# Patient Record
Sex: Female | Born: 1995 | Race: Black or African American | Hispanic: No | Marital: Single | Smoking: Never smoker
Health system: Southern US, Community
[De-identification: ages and names within clinical notes are randomized; demographics above are authoritative.]

## PROBLEM LIST (undated history)

## (undated) DIAGNOSIS — R609 Edema, unspecified: Secondary | ICD-10-CM

## (undated) DIAGNOSIS — K200 Eosinophilic esophagitis: Secondary | ICD-10-CM

## (undated) DIAGNOSIS — I10 Essential (primary) hypertension: Secondary | ICD-10-CM

## (undated) DIAGNOSIS — F909 Attention-deficit hyperactivity disorder, unspecified type: Secondary | ICD-10-CM

## (undated) DIAGNOSIS — K859 Acute pancreatitis without necrosis or infection, unspecified: Secondary | ICD-10-CM

## (undated) DIAGNOSIS — E1121 Type 2 diabetes mellitus with diabetic nephropathy: Secondary | ICD-10-CM

## (undated) DIAGNOSIS — N049 Nephrotic syndrome with unspecified morphologic changes: Secondary | ICD-10-CM

## (undated) DIAGNOSIS — F329 Major depressive disorder, single episode, unspecified: Secondary | ICD-10-CM

## (undated) DIAGNOSIS — F32A Depression, unspecified: Secondary | ICD-10-CM

## (undated) DIAGNOSIS — T7840XA Allergy, unspecified, initial encounter: Secondary | ICD-10-CM

## (undated) DIAGNOSIS — F319 Bipolar disorder, unspecified: Secondary | ICD-10-CM

## (undated) DIAGNOSIS — F4310 Post-traumatic stress disorder, unspecified: Secondary | ICD-10-CM

## (undated) DIAGNOSIS — F410 Panic disorder [episodic paroxysmal anxiety] without agoraphobia: Secondary | ICD-10-CM

## (undated) DIAGNOSIS — J45909 Unspecified asthma, uncomplicated: Secondary | ICD-10-CM

## (undated) DIAGNOSIS — E1140 Type 2 diabetes mellitus with diabetic neuropathy, unspecified: Secondary | ICD-10-CM

## (undated) DIAGNOSIS — K5010 Crohn's disease of large intestine without complications: Secondary | ICD-10-CM

## (undated) DIAGNOSIS — K219 Gastro-esophageal reflux disease without esophagitis: Secondary | ICD-10-CM

## (undated) HISTORY — DX: Type 2 diabetes mellitus with diabetic neuropathy, unspecified: E11.40

## (undated) HISTORY — DX: Nephrotic syndrome with unspecified morphologic changes: N04.9

## (undated) HISTORY — DX: Acute pancreatitis without necrosis or infection, unspecified: K85.90

## (undated) HISTORY — DX: Bipolar disorder, unspecified: F31.9

## (undated) HISTORY — PX: ADENOIDECTOMY: SUR15

## (undated) HISTORY — DX: Panic disorder (episodic paroxysmal anxiety): F41.0

## (undated) HISTORY — DX: Eosinophilic esophagitis: K20.0

## (undated) HISTORY — DX: Post-traumatic stress disorder, unspecified: F43.10

## (undated) HISTORY — DX: Attention-deficit hyperactivity disorder, unspecified type: F90.9

## (undated) HISTORY — DX: Type 2 diabetes mellitus with diabetic nephropathy: E11.21

## (undated) HISTORY — DX: Allergy, unspecified, initial encounter: T78.40XA

## (undated) HISTORY — DX: Unspecified asthma, uncomplicated: J45.909

## (undated) HISTORY — PX: HERNIA REPAIR: SHX51

## (undated) HISTORY — DX: Essential (primary) hypertension: I10

## (undated) HISTORY — DX: Gastro-esophageal reflux disease without esophagitis: K21.9

## (undated) HISTORY — PX: TONSILLECTOMY: SUR1361

## (undated) HISTORY — DX: Depression, unspecified: F32.A

## (undated) HISTORY — DX: Major depressive disorder, single episode, unspecified: F32.9

---
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
## 2015-02-09 ENCOUNTER — Other Ambulatory Visit: Payer: Self-pay | Admitting: Family Medicine

## 2015-02-11 NOTE — Telephone Encounter (Signed)
Attempted to call patient to enquire about this refill request. It appears that she last received a refill on 07/11/13 for 3 mL of lantus with 11 refills. Per that refill she received 110 days worth of insulin. It would appear that that prescription would only last her until the beginning of December 2014. It also appears that Dr Awanda Mink has been removed as the patients PCP in epic. I was going to enquire about this refill and how she has received her insulin over the past 1.5 years. Also was going to advise the patient that she needs a f/u appointment if she is going to continue to see Dr Awanda Mink. The phone was busy and I was unable to leave a message. I will ask nursing to give the patient a call to enquire about this.

## 2015-10-20 ENCOUNTER — Encounter (INDEPENDENT_AMBULATORY_CARE_PROVIDER_SITE_OTHER): Payer: Self-pay | Admitting: *Deleted

## 2015-11-22 ENCOUNTER — Encounter (INDEPENDENT_AMBULATORY_CARE_PROVIDER_SITE_OTHER): Payer: Self-pay | Admitting: *Deleted

## 2015-11-22 ENCOUNTER — Ambulatory Visit (INDEPENDENT_AMBULATORY_CARE_PROVIDER_SITE_OTHER): Payer: Medicaid Other | Admitting: Internal Medicine

## 2015-11-22 ENCOUNTER — Telehealth (INDEPENDENT_AMBULATORY_CARE_PROVIDER_SITE_OTHER): Payer: Self-pay | Admitting: Internal Medicine

## 2015-11-24 NOTE — Telephone Encounter (Signed)
error 

## 2015-11-30 ENCOUNTER — Ambulatory Visit: Payer: Self-pay | Admitting: "Endocrinology

## 2016-01-30 ENCOUNTER — Emergency Department (HOSPITAL_COMMUNITY)
Admission: EM | Admit: 2016-01-30 | Discharge: 2016-01-30 | Disposition: A | Discharge status: Home / Self Care | Payer: Medicaid Other | Attending: Emergency Medicine | Admitting: Emergency Medicine

## 2016-01-30 ENCOUNTER — Encounter (HOSPITAL_COMMUNITY): Payer: Self-pay | Admitting: *Deleted

## 2016-01-30 DIAGNOSIS — Y9389 Activity, other specified: Secondary | ICD-10-CM | POA: Insufficient documentation

## 2016-01-30 DIAGNOSIS — Y92410 Unspecified street and highway as the place of occurrence of the external cause: Secondary | ICD-10-CM | POA: Insufficient documentation

## 2016-01-30 DIAGNOSIS — S8990XA Unspecified injury of unspecified lower leg, initial encounter: Secondary | ICD-10-CM | POA: Insufficient documentation

## 2016-01-30 DIAGNOSIS — Y998 Other external cause status: Secondary | ICD-10-CM | POA: Diagnosis not present
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
## 2016-01-30 NOTE — ED Notes (Signed)
Pt left after triage- before being seen

## 2016-01-30 NOTE — ED Notes (Signed)
No report on this pt from triage she has not been visualized- not in room 1404

## 2016-06-27 ENCOUNTER — Ambulatory Visit: Payer: Self-pay | Admitting: "Endocrinology

## 2017-03-20 LAB — HEMOGLOBIN A1C: Hemoglobin A1C: 13.2

## 2017-03-27 ENCOUNTER — Ambulatory Visit (INDEPENDENT_AMBULATORY_CARE_PROVIDER_SITE_OTHER): Payer: Medicaid Other | Admitting: "Endocrinology

## 2017-03-27 ENCOUNTER — Encounter: Payer: Self-pay | Admitting: "Endocrinology

## 2017-03-27 VITALS — BP 106/75 | HR 106 | Ht <= 58 in | Wt 119.0 lb

## 2017-03-27 DIAGNOSIS — E108 Type 1 diabetes mellitus with unspecified complications: Secondary | ICD-10-CM

## 2017-03-27 DIAGNOSIS — E1065 Type 1 diabetes mellitus with hyperglycemia: Secondary | ICD-10-CM

## 2017-03-27 DIAGNOSIS — IMO0002 Reserved for concepts with insufficient information to code with codable children: Secondary | ICD-10-CM

## 2017-03-27 MED ORDER — GLUCOSE BLOOD VI STRP: ORAL_STRIP | 2 refills | Status: DC

## 2017-03-27 MED ORDER — INSULIN ASPART 100 UNIT/ML FLEXPEN: 10.0000 [IU] | PEN_INJECTOR | Freq: Three times a day (TID) | SUBCUTANEOUS | 2 refills | Status: DC

## 2017-03-27 MED ORDER — INSULIN GLARGINE 100 UNIT/ML SOLOSTAR PEN: 20.0000 [IU] | PEN_INJECTOR | Freq: Every day | SUBCUTANEOUS | 2 refills | Status: DC

## 2017-03-27 MED ORDER — GLUCAGON (RDNA) 1 MG IJ KIT: 1.0000 mg | PACK | Freq: Once | INTRAMUSCULAR | 99 refills | Status: DC | PRN

## 2017-03-27 NOTE — Progress Notes (Signed)
Subjective:    Patient ID: Diamond [name], female    DOB: Jan 06, 1996. Patient is being seen in consultation for management of diabetes requested by  Celedonio Savage, MD  Past Medical History:  Diagnosis Date  . ADHD (attention deficit hyperactivity disorder) 2007  . Allergy   . Asthma   . Crohn's colitis ([place])    Dx Dr. Cherrie Gauze  . Depression    early childhood, trial of seroquel?  . Diabetes mellitus 2008  . GERD (gastroesophageal reflux disease)    Past Surgical History:  Procedure Laterality Date  . ADENOIDECTOMY    . HERNIA REPAIR    . TONSILLECTOMY     Social History   Social History  . Marital status: Single    Spouse name: N/A  . Number of children: N/A  . Years of education: N/A   Social History Main Topics  . Smoking status: Never Smoker  . Smokeless tobacco: Never Used  . Alcohol use No  . Drug use: No  . Sexual activity: Not Currently    Birth control/ protection: Condom   Other Topics Concern  . None   Social History Narrative   Lives with mother Mont Dutton.    High school at [employer].               Outpatient Encounter Prescriptions as of 03/27/2017  Medication Sig  . albuterol (PROVENTIL HFA;VENTOLIN HFA) 108 (90 BASE) MCG/ACT inhaler Inhale 2 puffs into the lungs every 6 (six) hours as needed. For shortness of breath  . B-D ULTRAFINE III SHORT PEN 31G X 8 MM MISC USE 4 TIMES A DAY  . Fluticasone-Salmeterol (ADVAIR) 100-50 MCG/DOSE AEPB Inhale 1 puff into the lungs every 12 (twelve) hours.   Marland Kitchen glucagon (GLUCAGON EMERGENCY) 1 MG injection Inject 1 mg into the vein once as needed.  Marland Kitchen glucose blood (ACCU-CHEK GUIDE) test strip Use as instructed  . insulin aspart (NOVOLOG FLEXPEN) 100 UNIT/ML FlexPen Inject 10-16 Units into the skin 3 (three) times daily with meals.  . Insulin Glargine (LANTUS SOLOSTAR) 100 UNIT/ML Solostar Pen Inject 20 Units into the skin daily at 10 pm.  . [DISCONTINUED] cetirizine (ZYRTEC) 10 MG tablet Take 10 mg by  mouth daily.  . [DISCONTINUED] glucagon (GLUCAGON EMERGENCY) 1 MG injection Inject 1 mg into the vein once as needed.  . [DISCONTINUED] glucose blood (ACCU-CHEK GUIDE) test strip Use as instructed  . [DISCONTINUED] hydrocortisone 2.5 % ointment Apply topically 2 (two) times daily.  . [DISCONTINUED] ibuprofen (ADVIL,MOTRIN) 200 MG tablet Take 200 mg by mouth every 6 (six) hours as needed. For pain  . [DISCONTINUED] insulin aspart (NOVOLOG FLEXPEN) 100 UNIT/ML FlexPen Inject 10-16 Units into the skin 3 (three) times daily with meals.  . [DISCONTINUED] insulin aspart (NOVOLOG) 100 UNIT/ML injection Inject 5 Units into the skin 3 (three) times daily before meals. Give 1 unit per 15 g carbs  . [DISCONTINUED] Insulin Glargine (LANTUS SOLOSTAR) 100 UNIT/ML Solostar Pen Inject 20 Units into the skin daily at 10 pm.  . [DISCONTINUED] insulin glargine (LANTUS) 100 UNIT/ML injection Inject 20 Units into the skin at bedtime.  . [DISCONTINUED] LANTUS SOLOSTAR 100 UNIT/ML SOPN inject 30 units subcutaneously at bedtime (Patient taking differently: inject 20 units subcutaneously at bedtime)  . [DISCONTINUED] NOVOLOG FLEXPEN 100 UNIT/ML SOPN FlexPen INJECT 0-20 UNITS INTO THE SKIN 3 TIMES DAILY BEFORE MEALS AS INSTRUCTED (Patient taking differently: INJECT 10 UNITS INTO THE SKIN 3 TIMES DAILY BEFORE MEALS AS INSTRUCTED)  . [DISCONTINUED]  pantoprazole (PROTONIX) 20 MG tablet take 1 tablet by mouth once daily   No facility-administered encounter medications on file as of 03/27/2017.    ALLERGIES: Allergies  Allergen Reactions  . Amoxicillin Hives  . Augmentin [Amoxicillin-Pot Clavulanate] Other (See Comments)    Reaction unknown  . Adhesive [Tape] Itching and Rash  . Latex Itching and Rash  . Pineapple Rash    Rash on tongue and throat   VACCINATION STATUS: Immunization History  Administered Date(s) Administered  . HPV Quadrivalent 08/27/2012  . Hepatitis A 08/27/2012  . Influenza Split 08/27/2012  .  Meningococcal Conjugate 08/27/2012    Diabetes  She presents for her initial diabetic visit. She has type 1 diabetes mellitus. Onset time: She was diagnosed at approximate age of 46 years. Her disease course has been worsening. There are no hypoglycemic associated symptoms. Pertinent negatives for hypoglycemia include no confusion, headaches, pallor or seizures. Associated symptoms include blurred vision, fatigue, polydipsia and polyuria. Pertinent negatives for diabetes include no chest pain and no polyphagia. There are no hypoglycemic complications. Symptoms are worsening. There are no diabetic complications. Risk factors for coronary artery disease include diabetes mellitus and sedentary lifestyle. Current diabetic treatment includes insulin injections. Her weight is decreasing steadily. She is following a generally unhealthy diet. When asked about meal planning, she reported none. She has not had a previous visit with a dietitian. She never participates in exercise. (She did not bring any meter nor logs to review today. She does not monitor blood glucose regularly.) An ACE inhibitor/angiotensin II receptor blocker is not being taken.       Review of Systems  Constitutional: Positive for fatigue. Negative for chills, fever and unexpected weight change.  HENT: Negative for trouble swallowing and voice change.   Eyes: Positive for blurred vision. Negative for visual disturbance.  Respiratory: Negative for cough, shortness of breath and wheezing.   Cardiovascular: Negative for chest pain, palpitations and leg swelling.  Gastrointestinal: Negative for diarrhea, nausea and vomiting.  Endocrine: Positive for polydipsia and polyuria. Negative for cold intolerance, heat intolerance and polyphagia.  Musculoskeletal: Negative for arthralgias and myalgias.  Skin: Negative for color change, pallor, rash and wound.  Neurological: Negative for seizures and headaches.  Psychiatric/Behavioral: Negative for  confusion and suicidal ideas.    Objective:    BP 106/75   Pulse (!) 106   Ht 4' 10"  (1.473 m)   Wt 119 lb (54 kg)   BMI 24.87 kg/m   Wt Readings from Last 3 Encounters:  03/27/17 119 lb (54 kg)  01/30/16 127 lb (57.6 kg) (48 %, Z= -0.05)*  10/23/12 125 lb 3.2 oz (56.8 kg) (59 %, Z= 0.24)*   * Growth percentiles are based on CDC 2-20 Years data.    Physical Exam  Constitutional: She is oriented to person, place, and time. She appears well-developed.  HENT:  Head: Normocephalic and atraumatic.  Eyes: EOM are normal.  Neck: Normal range of motion. Neck supple. No tracheal deviation present. No thyromegaly present.  Cardiovascular: Normal rate and regular rhythm.   Pulmonary/Chest: Effort normal and breath sounds normal.  Abdominal: Soft. Bowel sounds are normal. There is no tenderness. There is no guarding.  Musculoskeletal: Normal range of motion. She exhibits no edema.  Neurological: She is alert and oriented to person, place, and time. She has normal reflexes. No cranial nerve deficit. Coordination normal.  Skin: Skin is warm and dry. No rash noted. No erythema. No pallor.  Psychiatric:  Patient is very reluctant. She  appears to minimize the impact and burden of diabetes.    CMP     Component Value Date/Time   NA 135 09/13/2012 2040   K 4.0 09/13/2012 2040   CL 101 09/13/2012 2040   CO2 23 09/13/2012 1958   GLUCOSE 406 (H) 09/13/2012 2040   BUN 11 09/13/2012 2040   CREATININE 0.80 09/13/2012 2040   CREATININE 0.69 07/08/2012 1203   CALCIUM 9.5 09/13/2012 1958   PROT 7.4 08/22/2012 2218   ALBUMIN 3.3 (L) 08/22/2012 2218   AST 33 08/22/2012 2218   ALT 9 08/22/2012 2218   ALKPHOS 62 08/22/2012 2218   BILITOT 0.4 08/22/2012 2218   GFRNONAA NOT CALCULATED 09/13/2012 1958   GFRAA NOT CALCULATED 09/13/2012 1958     Diabetic Labs (most recent): Lab Results  Component Value Date   HGBA1C 13.2 03/20/2017   HGBA1C 10.1 10/23/2012   HGBA1C 11.2 07/08/2012      Assessment & Plan:   1. Uncontrolled type 1 diabetes mellitus with complication ([place])  - Patient has currently uncontrolled symptomatic type 1 DM since  21 years of age,  with most recent A1c of 13.2 %.  - Patient has had several prior DKA episodes and several ER visits for extremes of glycemic profiles.  She currently resides with her aunt in [place].    Her diabetes is complicated by inadequate engagement in self-care and patient remains at a high risk for more acute and chronic complications of diabetes which include CAD, CVA, CKD, retinopathy, and neuropathy. These are all discussed in detail with the patient.  - I have counseled the patient on diet management and weight loss, by adopting a carbohydrate restricted/protein rich diet.  - Suggestion is made for patient to avoid simple carbohydrates   from their diet including Cakes , Desserts, Ice Cream,  Soda (  diet and regular) , Sweet Tea , Candies,  Chips, Cookies, Artificial Sweeteners,   and "Sugar-free" Products . This will help patient to have stable blood glucose profile and potentially avoid unintended weight gain.  - I encouraged the patient to switch to  unprocessed or minimally processed complex starch and increased protein intake (animal or plant source), fruits, and vegetables.  - Patient is advised to stick to a routine mealtimes to eat 3 meals  a day and avoid unnecessary snacks ( to snack only to correct hypoglycemia).  - The patient will be scheduled with Jearld Fenton, RDN, CDE for individualized DM education.  - I have approached patient with the following individualized plan to manage diabetes and patient agrees:   - Based on her current A1c of 13.2%, she may need higher dose of insulin. -However, it is essential to assure her commitment for proper monitoring/safe use of insulin. -  I  will proceed with her basal insulin of Lantus 20 units daily at bedtime, and prandial insulin NovoLog 10 units 3  times a day before meals for pre-meal BG readings of 90-112m/dl, plus patient specific correction dose for unexpected hyperglycemia above 1574mdl, associated with strict monitoring of glucose  AC and HS. - Patient is warned not to take insulin without proper monitoring per orders. -Adjustment parameters are given for hypo and hyperglycemia in writing. -Patient is encouraged to call clinic for blood glucose levels less than 70 or above 300 mg /dl. - Insulin is exclusive choices she has to treat diabetes. - Patient specific target  A1c;  LDL, HDL, Triglycerides, and  Waist Circumference were discussed in detail.  2) BP:  Controlled.  3) Lipids/HPL:   Control unknown.    4)  Weight/Diet: Her current weight is appropriate for her height, CDE Consult will be initiated , exercise, and detailed carbohydrates information provided.  5) Chronic Care/Health Maintenance: - She is  encouraged to continue to follow up with Ophthalmology, Podiatrist at least yearly or according to recommendations, and advised to   stay away from smoking. I have recommended yearly flu vaccine and pneumonia vaccination at least every 5 years; moderate intensity exercise for up to 150 minutes weekly; and  sleep for at least 7 hours a day.  - 60 minutes of time was spent on the care of this patient , 50% of which was applied for counseling on diabetes complications and their preventions.  - Patient to bring meter and  blood glucose logs during her next visit.   - I advised patient to maintain close follow up with Celedonio Savage, MD for primary care needs.  Follow up plan: - Return in about 1 week (around 04/03/2017) for follow up with meter and logs- no labs.  Glade Lloyd, MD Phone: [phone]  Fax: [phone]   03/27/2017, 3:49 PM

## 2017-03-27 NOTE — Patient Instructions (Signed)

## 2017-04-01 ENCOUNTER — Other Ambulatory Visit: Payer: Self-pay

## 2017-04-01 ENCOUNTER — Telehealth: Payer: Self-pay | Admitting: "Endocrinology

## 2017-04-01 MED ORDER — GLUCOSE BLOOD VI STRP: ORAL_STRIP | 5 refills | Status: DC

## 2017-04-01 NOTE — Telephone Encounter (Signed)
RX sent

## 2017-04-01 NOTE — Telephone Encounter (Signed)
Diamond [name] is asking for a refill of Test Strips to be sent in for her Accu Check Guide to [facility] in [place], please Advise?

## 2017-04-03 ENCOUNTER — Other Ambulatory Visit: Payer: Self-pay

## 2017-04-03 ENCOUNTER — Ambulatory Visit: Payer: Medicaid Other | Admitting: "Endocrinology

## 2017-04-03 MED ORDER — GLUCOSE BLOOD VI STRP: ORAL_STRIP | 5 refills | Status: DC

## 2017-04-03 MED ORDER — INSULIN ASPART 100 UNIT/ML FLEXPEN: 10.0000 [IU] | PEN_INJECTOR | Freq: Three times a day (TID) | SUBCUTANEOUS | 2 refills | Status: DC

## 2017-04-03 MED ORDER — INSULIN GLARGINE 100 UNIT/ML SOLOSTAR PEN: 20.0000 [IU] | PEN_INJECTOR | Freq: Every day | SUBCUTANEOUS | 2 refills | Status: DC

## 2017-04-04 ENCOUNTER — Ambulatory Visit: Payer: Medicaid Other | Admitting: "Endocrinology

## 2017-04-15 ENCOUNTER — Ambulatory Visit (INDEPENDENT_AMBULATORY_CARE_PROVIDER_SITE_OTHER): Payer: Medicaid Other | Admitting: "Endocrinology

## 2017-04-15 ENCOUNTER — Encounter: Payer: Self-pay | Admitting: "Endocrinology

## 2017-04-15 VITALS — BP 113/76 | HR 104 | Ht <= 58 in | Wt 120.0 lb

## 2017-04-15 DIAGNOSIS — E108 Type 1 diabetes mellitus with unspecified complications: Secondary | ICD-10-CM | POA: Diagnosis not present

## 2017-04-15 DIAGNOSIS — IMO0002 Reserved for concepts with insufficient information to code with codable children: Secondary | ICD-10-CM

## 2017-04-15 DIAGNOSIS — E1065 Type 1 diabetes mellitus with hyperglycemia: Secondary | ICD-10-CM

## 2017-04-15 MED ORDER — INSULIN GLARGINE 100 UNIT/ML SOLOSTAR PEN: 25.0000 [IU] | PEN_INJECTOR | Freq: Every day | SUBCUTANEOUS | 2 refills | Status: DC

## 2017-04-15 MED ORDER — INSULIN ASPART 100 UNIT/ML FLEXPEN: 12.0000 [IU] | PEN_INJECTOR | Freq: Three times a day (TID) | SUBCUTANEOUS | 2 refills | Status: DC

## 2017-04-15 NOTE — Progress Notes (Signed)
Subjective:    Patient ID: Diamond [name], female    DOB: 27-Jan-1996. Patient is being seen in consultation for management of diabetes requested by  Celedonio Savage, MD  Past Medical History:  Diagnosis Date  . ADHD (attention deficit hyperactivity disorder) 2007  . Allergy   . Asthma   . Crohn's colitis ([place])    Dx Dr. Cherrie Gauze  . Depression    early childhood, trial of seroquel?  . Diabetes mellitus 2008  . GERD (gastroesophageal reflux disease)    Past Surgical History:  Procedure Laterality Date  . ADENOIDECTOMY    . HERNIA REPAIR    . TONSILLECTOMY     Social History   Social History  . Marital status: Single    Spouse name: N/A  . Number of children: N/A  . Years of education: N/A   Social History Main Topics  . Smoking status: Never Smoker  . Smokeless tobacco: Never Used  . Alcohol use No  . Drug use: No  . Sexual activity: Not Currently    Birth control/ protection: Condom   Other Topics Concern  . None   Social History Narrative   Lives with mother Mont Dutton.    High school at [employer].               Outpatient Encounter Prescriptions as of 04/15/2017  Medication Sig  . albuterol (PROVENTIL HFA;VENTOLIN HFA) 108 (90 BASE) MCG/ACT inhaler Inhale 2 puffs into the lungs every 6 (six) hours as needed. For shortness of breath  . B-D ULTRAFINE III SHORT PEN 31G X 8 MM MISC USE 4 TIMES A DAY  . Fluticasone-Salmeterol (ADVAIR) 100-50 MCG/DOSE AEPB Inhale 1 puff into the lungs every 12 (twelve) hours.   Marland Kitchen glucagon (GLUCAGON EMERGENCY) 1 MG injection Inject 1 mg into the vein once as needed.  Marland Kitchen glucose blood (ACCU-CHEK GUIDE) test strip Use as instructed 4 x daily. E11.65  . insulin aspart (NOVOLOG FLEXPEN) 100 UNIT/ML FlexPen Inject 12-18 Units into the skin 3 (three) times daily with meals.  . Insulin Glargine (LANTUS SOLOSTAR) 100 UNIT/ML Solostar Pen Inject 25 Units into the skin daily at 10 pm.  . [DISCONTINUED] insulin aspart (NOVOLOG  FLEXPEN) 100 UNIT/ML FlexPen Inject 10-16 Units into the skin 3 (three) times daily with meals.  . [DISCONTINUED] Insulin Glargine (LANTUS SOLOSTAR) 100 UNIT/ML Solostar Pen Inject 20 Units into the skin daily at 10 pm.   No facility-administered encounter medications on file as of 04/15/2017.    ALLERGIES: Allergies  Allergen Reactions  . Amoxicillin Hives  . Augmentin [Amoxicillin-Pot Clavulanate] Other (See Comments)    Reaction unknown  . Adhesive [Tape] Itching and Rash  . Latex Itching and Rash  . Pineapple Rash    Rash on tongue and throat   VACCINATION STATUS: Immunization History  Administered Date(s) Administered  . HPV Quadrivalent 08/27/2012  . Hepatitis A 08/27/2012  . Influenza Split 08/27/2012  . Meningococcal Conjugate 08/27/2012    Diabetes  She presents for her follow-up diabetic visit. She has type 1 diabetes mellitus. Onset time: She was diagnosed at approximate age of 48 years. Her disease course has been worsening. There are no hypoglycemic associated symptoms. Pertinent negatives for hypoglycemia include no confusion, headaches, pallor or seizures. Associated symptoms include blurred vision, fatigue, polydipsia and polyuria. Pertinent negatives for diabetes include no chest pain and no polyphagia. There are no hypoglycemic complications. Symptoms are worsening. There are no diabetic complications. Risk factors for coronary artery disease include  diabetes mellitus and sedentary lifestyle. Current diabetic treatment includes insulin injections. Her weight is increasing steadily. She is following a generally unhealthy diet. When asked about meal planning, she reported none. She has not had a previous visit with a dietitian. She never participates in exercise. Her breakfast blood glucose range is generally >200 mg/dl. Her lunch blood glucose range is generally 180-200 mg/dl. Her dinner blood glucose range is generally 180-200 mg/dl. Her bedtime blood glucose range is  generally >200 mg/dl. Her overall blood glucose range is >200 mg/dl. An ACE inhibitor/angiotensin II receptor blocker is not being taken.       Review of Systems  Constitutional: Positive for fatigue. Negative for chills, fever and unexpected weight change.  HENT: Negative for trouble swallowing and voice change.   Eyes: Positive for blurred vision. Negative for visual disturbance.  Respiratory: Negative for cough, shortness of breath and wheezing.   Cardiovascular: Negative for chest pain, palpitations and leg swelling.  Gastrointestinal: Negative for diarrhea, nausea and vomiting.  Endocrine: Positive for polydipsia and polyuria. Negative for cold intolerance, heat intolerance and polyphagia.  Musculoskeletal: Negative for arthralgias and myalgias.  Skin: Negative for color change, pallor, rash and wound.  Neurological: Negative for seizures and headaches.  Psychiatric/Behavioral: Negative for confusion and suicidal ideas.    Objective:    BP 113/76   Pulse (!) 104   Ht 4' 10"  (1.473 m)   Wt 120 lb (54.4 kg)   BMI 25.08 kg/m   Wt Readings from Last 3 Encounters:  04/15/17 120 lb (54.4 kg)  03/27/17 119 lb (54 kg)  01/30/16 127 lb (57.6 kg) (48 %, Z= -0.05)*   * Growth percentiles are based on CDC 2-20 Years data.    Physical Exam  Constitutional: She is oriented to person, place, and time. She appears well-developed.  HENT:  Head: Normocephalic and atraumatic.  Eyes: EOM are normal.  Neck: Normal range of motion. Neck supple. No tracheal deviation present. No thyromegaly present.  Cardiovascular: Normal rate and regular rhythm.   Pulmonary/Chest: Effort normal and breath sounds normal.  Abdominal: Soft. Bowel sounds are normal. There is no tenderness. There is no guarding.  Musculoskeletal: Normal range of motion. She exhibits no edema.  Neurological: She is alert and oriented to person, place, and time. She has normal reflexes. No cranial nerve deficit. Coordination  normal.  Skin: Skin is warm and dry. No rash noted. No erythema. No pallor.  Psychiatric:  Patient is very reluctant. She appears to minimize the impact and burden of diabetes.    CMP     Component Value Date/Time   NA 135 09/13/2012 2040   K 4.0 09/13/2012 2040   CL 101 09/13/2012 2040   CO2 23 09/13/2012 1958   GLUCOSE 406 (H) 09/13/2012 2040   BUN 11 09/13/2012 2040   CREATININE 0.80 09/13/2012 2040   CREATININE 0.69 07/08/2012 1203   CALCIUM 9.5 09/13/2012 1958   PROT 7.4 08/22/2012 2218   ALBUMIN 3.3 (L) 08/22/2012 2218   AST 33 08/22/2012 2218   ALT 9 08/22/2012 2218   ALKPHOS 62 08/22/2012 2218   BILITOT 0.4 08/22/2012 2218   GFRNONAA NOT CALCULATED 09/13/2012 1958   GFRAA NOT CALCULATED 09/13/2012 1958     Diabetic Labs (most recent): Lab Results  Component Value Date   HGBA1C 13.2 03/20/2017   HGBA1C 10.1 10/23/2012   HGBA1C 11.2 07/08/2012     Assessment & Plan:   1. Uncontrolled type 1 diabetes mellitus with complication ([facility])  -  Patient has currently uncontrolled symptomatic type 1 DM since  21 years of age,  with most recent A1c of 13.2 %.  - Patient has had several prior DKA episodes and several ER visits for extremes of glycemic profiles.  She currently resides with her aunt in [place].    Her diabetes is complicated by inadequate engagement in self-care and patient remains at a high risk for more acute and chronic complications of diabetes which include CAD, CVA, CKD, retinopathy, and neuropathy. These are all discussed in detail with the patient.  - I have counseled the patient on diet management and weight loss, by adopting a carbohydrate restricted/protein rich diet.  - Suggestion is made for patient to avoid simple carbohydrates   from their diet including Cakes , Desserts, Ice Cream,  Soda (  diet and regular) , Sweet Tea , Candies,  Chips, Cookies, Artificial Sweeteners,   and "Sugar-free" Products . This will help patient to have  stable blood glucose profile and potentially avoid unintended weight gain.  - I encouraged the patient to switch to  unprocessed or minimally processed complex starch and increased protein intake (animal or plant source), fruits, and vegetables.  - Patient is advised to stick to a routine mealtimes to eat 3 meals  a day and avoid unnecessary snacks ( to snack only to correct hypoglycemia).  - The patient will be scheduled with Jearld Fenton, RDN, CDE for individualized DM education.  - I have approached patient with the following individualized plan to manage diabetes and patient agrees:    - She showed reasonable commitment for basal/bolus insulin use. -  I  will increase her basal insulin of Lantus to 25 units daily at bedtime, and increase prandial insulin NovoLog 12 units 3 times a day before meals for pre-meal BG readings of 90-175m/dl, plus patient specific correction dose for unexpected hyperglycemia above 1538mdl, associated with strict monitoring of glucose  AC and HS. - Patient is warned not to take insulin without proper monitoring per orders. -Adjustment parameters are given for hypo and hyperglycemia in writing. -Patient is encouraged to call clinic for blood glucose levels less than 70 or above 300 mg /dl. - Insulin is exclusive choices she has to treat diabetes. - Patient specific target  A1c;  LDL, HDL, Triglycerides, and  Waist Circumference were discussed in detail.  2) BP:  Controlled.  3) Lipids/HPL:   Control unknown.    4)  Weight/Diet: Her current weight is appropriate for her height, CDE Consult will be initiated , exercise, and detailed carbohydrates information provided.  5) Chronic Care/Health Maintenance: - She is  encouraged to continue to follow up with Ophthalmology, Podiatrist at least yearly or according to recommendations, and advised to   stay away from smoking. I have recommended yearly flu vaccine and pneumonia vaccination at least every 5 years;  moderate intensity exercise for up to 150 minutes weekly; and  sleep for at least 7 hours a day.  - 30 minutes of time was spent on the care of this patient , 50% of which was applied for counseling on diabetes complications and their preventions.  - Patient to bring meter and  blood glucose logs during her next visit.   - I advised patient to maintain close follow up with her PMD for primary care needs.  Follow up plan: - Return in about 9 weeks (around 06/17/2017).  GeGlade LloydMD Phone: [phone]: [phone] 04/15/2017, 11:16 AM

## 2017-06-17 ENCOUNTER — Ambulatory Visit: Payer: Medicaid Other | Admitting: "Endocrinology

## 2017-06-17 ENCOUNTER — Encounter: Payer: Self-pay | Admitting: "Endocrinology

## 2017-10-28 ENCOUNTER — Other Ambulatory Visit: Payer: Self-pay | Admitting: "Endocrinology

## 2019-12-24 ENCOUNTER — Encounter (HOSPITAL_COMMUNITY): Payer: Self-pay | Admitting: Emergency Medicine

## 2019-12-24 ENCOUNTER — Emergency Department (HOSPITAL_COMMUNITY)
Admission: EM | Admit: 2019-12-24 | Discharge: 2019-12-25 | Disposition: A | Discharge status: Home / Self Care | Payer: Managed Care, Other (non HMO) | Attending: Emergency Medicine | Admitting: Emergency Medicine

## 2019-12-24 ENCOUNTER — Other Ambulatory Visit: Payer: Self-pay

## 2019-12-24 DIAGNOSIS — J45909 Unspecified asthma, uncomplicated: Secondary | ICD-10-CM | POA: Diagnosis not present

## 2019-12-24 DIAGNOSIS — Z794 Long term (current) use of insulin: Secondary | ICD-10-CM | POA: Diagnosis not present

## 2019-12-24 DIAGNOSIS — Z91040 Latex allergy status: Secondary | ICD-10-CM | POA: Diagnosis not present

## 2019-12-24 DIAGNOSIS — Z888 Allergy status to other drugs, medicaments and biological substances status: Secondary | ICD-10-CM | POA: Insufficient documentation

## 2019-12-24 DIAGNOSIS — U071 COVID-19: Secondary | ICD-10-CM | POA: Diagnosis not present

## 2019-12-24 DIAGNOSIS — Z79899 Other long term (current) drug therapy: Secondary | ICD-10-CM | POA: Diagnosis not present

## 2019-12-24 DIAGNOSIS — Z91048 Other nonmedicinal substance allergy status: Secondary | ICD-10-CM | POA: Diagnosis not present

## 2019-12-24 DIAGNOSIS — R112 Nausea with vomiting, unspecified: Secondary | ICD-10-CM | POA: Diagnosis present

## 2019-12-24 DIAGNOSIS — Z91018 Allergy to other foods: Secondary | ICD-10-CM | POA: Insufficient documentation

## 2019-12-24 DIAGNOSIS — Z880 Allergy status to penicillin: Secondary | ICD-10-CM | POA: Diagnosis not present

## 2019-12-24 DIAGNOSIS — E109 Type 1 diabetes mellitus without complications: Secondary | ICD-10-CM | POA: Insufficient documentation

## 2019-12-24 LAB — COMPREHENSIVE METABOLIC PANEL
ALT: 24 U/L (ref 0–44)
AST: 38 U/L (ref 15–41)
Albumin: 2.5 g/dL — ABNORMAL LOW (ref 3.5–5.0)
Alkaline Phosphatase: 67 U/L (ref 38–126)
Anion gap: 9 (ref 5–15)
BUN: <5 mg/dL — ABNORMAL LOW (ref 6–20)
CO2: 28 mmol/L (ref 22–32)
Calcium: 8.5 mg/dL — ABNORMAL LOW (ref 8.9–10.3)
Chloride: 103 mmol/L (ref 98–111)
Creatinine, Ser: 0.45 mg/dL (ref 0.44–1.00)
GFR calc Af Amer: >60 mL/min (ref >60)
GFR calc non Af Amer: >60 mL/min (ref >60)
Glucose, Bld: 140 mg/dL — ABNORMAL HIGH (ref 70–99)
Potassium: 3.7 mmol/L (ref 3.5–5.1)
Sodium: 140 mmol/L (ref 135–145)
Total Bilirubin: 1 mg/dL (ref 0.3–1.2)
Total Protein: 6.3 g/dL — ABNORMAL LOW (ref 6.5–8.1)

## 2019-12-24 LAB — CBC
HCT: 32.4 % — ABNORMAL LOW (ref 36.0–46.0)
Hemoglobin: 10.5 g/dL — ABNORMAL LOW (ref 12.0–15.0)
MCH: 29.7 pg (ref 26.0–34.0)
MCHC: 32.4 g/dL (ref 30.0–36.0)
MCV: 91.8 fL (ref 80.0–100.0)
Platelets: 349 10*3/uL (ref 150–400)
RBC: 3.53 MIL/uL — ABNORMAL LOW (ref 3.87–5.11)
RDW: 12 % (ref 11.5–15.5)
WBC: 5.1 10*3/uL (ref 4.0–10.5)
nRBC: 0 % (ref 0.0–0.2)

## 2019-12-24 LAB — I-STAT BETA HCG BLOOD, ED (MC, WL, AP ONLY): I-stat hCG, quantitative: <5 m[IU]/mL (ref <5)

## 2019-12-24 LAB — LIPASE, BLOOD: Lipase: 20 U/L (ref 11–51)

## 2019-12-24 LAB — CBG MONITORING, ED: Glucose-Capillary: 130 mg/dL — ABNORMAL HIGH (ref 70–99)

## 2019-12-24 MED ORDER — FAMOTIDINE 20 MG PO TABS
20.0000 mg | ORAL_TABLET | Freq: Once | ORAL | Status: AC
  Administered 2019-12-24: 20 mg via ORAL
  Filled 2019-12-24: qty 1

## 2019-12-24 MED ORDER — ONDANSETRON HCL 4 MG/2ML IJ SOLN
4.0000 mg | Freq: Once | INTRAMUSCULAR | Status: AC
  Administered 2019-12-24: 21:00:00 4 mg via INTRAVENOUS
  Filled 2019-12-24: qty 2

## 2019-12-24 MED ORDER — ALUM & MAG HYDROXIDE-SIMETH 200-200-20 MG/5ML PO SUSP
15.0000 mL | Freq: Once | ORAL | Status: AC
  Administered 2019-12-24: 23:00:00 15 mL via ORAL
  Filled 2019-12-24: qty 30

## 2019-12-24 MED ORDER — SODIUM CHLORIDE 0.9% FLUSH
3.0000 mL | Freq: Once | INTRAVENOUS | Status: AC
  Administered 2019-12-24: 3 mL via INTRAVENOUS

## 2019-12-24 MED ORDER — ACETAMINOPHEN 325 MG PO TABS
650.0000 mg | ORAL_TABLET | Freq: Once | ORAL | Status: AC
  Administered 2019-12-24: 23:00:00 650 mg via ORAL
  Filled 2019-12-24: qty 2

## 2019-12-24 MED ORDER — SODIUM CHLORIDE 0.9 % IV SOLN: Freq: Once | INTRAVENOUS | Status: AC

## 2019-12-24 NOTE — ED Triage Notes (Addendum)
Patient complaining of vomiting and not being able to keep food down. Patient just got out of hospital today. Patient has discharge papers showing she is covid positive. Patient is coughing.

## 2019-12-24 NOTE — ED Notes (Signed)
Pt made aware urine sample needed

## 2019-12-24 NOTE — ED Provider Notes (Signed)
[place] DEPT Provider Note   CSN: [id number] Arrival date & time: 12/24/19  2003     History Chief Complaint  Patient presents with  . covid positive  . Emesis    Diamond [name] is a 24 y.o. female.  Pt reports she was admitted to [facility] on Sunday the 17th.  Pt reports she tested positive for Covid.  Pt reports she was discharged today.  Pt states she has been vomiting since leaving. Pt reports she is diabetic.  Pt complains of cough and nausea   The history is provided by the patient. No language interpreter was used.  Emesis Severity:  Moderate Duration:  4 days Timing:  Constant Progression:  Worsening Recent urination:  Normal Relieved by:  Nothing Worsened by:  Liquids Ineffective treatments:  None tried Associated symptoms: cough   Associated symptoms: no abdominal pain   Risk factors: diabetes        Past Medical History:  Diagnosis Date  . ADHD (attention deficit hyperactivity disorder) 2007  . Allergy   . Asthma   . Crohn's colitis ([place])    Dx Dr. Cherrie Gauze  . Depression    early childhood, trial of seroquel?  . Diabetes mellitus 2008  . GERD (gastroesophageal reflux disease)     Patient Active Problem List   Diagnosis Date Noted  . Eczema 10/23/2012  . Abdominal pain 08/27/2012  . GERD (gastroesophageal reflux disease) 07/25/2012  . Mood disorder ([place]) 07/09/2012  . Contraception management 07/09/2012  . History of Crohn's disease 07/09/2012  . Diabetes mellitus type 1, uncontrolled, insulin dependent ([place]) 07/08/2012  . Asthma, persistent controlled 07/08/2012    Past Surgical History:  Procedure Laterality Date  . ADENOIDECTOMY    . HERNIA REPAIR    . TONSILLECTOMY       OB History   No obstetric history on file.     Family History  Problem Relation Age of Onset  . Hypertension Mother   . Diabetes Mother   . Crohn's disease Father   . Alcohol abuse Father   . Drug abuse  Father   . Mental illness Father   . Hypertension Maternal Aunt   . Heart disease Maternal Aunt   . Asthma Maternal Aunt   . Asthma Maternal Uncle   . Cancer Maternal Grandmother   . Diabetes Maternal Grandmother   . Mental illness Maternal Grandfather     Social History   Tobacco Use  . Smoking status: Never Smoker  . Smokeless tobacco: Never Used  Substance Use Topics  . Alcohol use: No  . Drug use: No    Home Medications Prior to Admission medications   Medication Sig Start Date End Date Taking? Authorizing Provider  albuterol (PROVENTIL HFA;VENTOLIN HFA) 108 (90 BASE) MCG/ACT inhaler Inhale 2 puffs into the lungs every 6 (six) hours as needed. For shortness of breath 07/08/12   Clovis Cao, MD  B-D ULTRAFINE III SHORT PEN 31G X 8 MM MISC USE 4 TIMES A DAY 07/21/13   Hess, Tamela Oddi, DO  Fluticasone-Salmeterol (ADVAIR) 100-50 MCG/DOSE AEPB Inhale 1 puff into the lungs every 12 (twelve) hours.  07/08/12   Clovis Cao, MD  glucagon (GLUCAGON EMERGENCY) 1 MG injection Inject 1 mg into the vein once as needed. 03/27/17 03/27/18  Cassandria Anger, MD  glucose blood (ACCU-CHEK GUIDE) test strip Use as instructed 4 x daily. E11.65 04/03/17   Cassandria Anger, MD  insulin aspart (NOVOLOG FLEXPEN) 100 UNIT/ML  FlexPen Inject 12-18 Units into the skin 3 (three) times daily with meals. 04/15/17   Cassandria Anger, MD  Insulin Glargine (LANTUS SOLOSTAR) 100 UNIT/ML Solostar Pen Inject 25 Units into the skin daily at 10 pm. 04/15/17   Cassandria Anger, MD    Allergies    Amoxicillin, Augmentin [amoxicillin-pot clavulanate], Clavulanic acid, Adhesive [tape], Latex, Other, and Pineapple  Review of Systems   Review of Systems  Respiratory: Positive for cough.   Gastrointestinal: Positive for vomiting. Negative for abdominal pain.  All other systems reviewed and are negative.   Physical Exam Updated Vital Signs BP (!) 133/105   Pulse (!) 52   Temp 99.7 F (37.6 C) (Oral)    Resp 14   SpO2 99%   Physical Exam Vitals and nursing note reviewed.  Constitutional:      Appearance: She is well-developed.  HENT:     Head: Normocephalic.  Cardiovascular:     Rate and Rhythm: Normal rate.  Pulmonary:     Effort: Pulmonary effort is normal.  Abdominal:     General: Abdomen is flat. There is no distension.     Palpations: Abdomen is soft.  Musculoskeletal:        General: Normal range of motion.     Cervical back: Normal range of motion.  Skin:    General: Skin is warm.  Neurological:     General: No focal deficit present.     Mental Status: She is alert and oriented to person, place, and time.  Psychiatric:        Mood and Affect: Mood normal.     ED Results / Procedures / Treatments   Labs (all labs ordered are listed, but only abnormal results are displayed) Labs Reviewed  LIPASE, BLOOD  COMPREHENSIVE METABOLIC PANEL  CBC  URINALYSIS, ROUTINE W REFLEX MICROSCOPIC  I-STAT BETA HCG BLOOD, ED ([facility], [facility], [facility] ONLY)  POC SARS CORONAVIRUS 2 AG -  ED    EKG None  Radiology No results found.  Procedures Procedures (including critical care time)  Medications Ordered in ED Medications  sodium chloride flush (NS) 0.9 % injection 3 mL (has no administration in time range)    ED Course  I have reviewed the triage vital signs and the nursing notes.  Pertinent labs & imaging results that were available during my care of the patient were reviewed by me and considered in my medical decision making (see chart for details).    MDM Rules/Calculators/A&P                     MDM: IV fluids and zofran ordered.  Pt's care turned over to Dr. Ashok Cordia at 9pm labs pending Final Clinical Impression(s) / ED Diagnoses Final diagnoses:  Nausea and vomiting, intractability of vomiting not specified, unspecified vomiting type    Rx / DC Orders ED Discharge Orders    None       Sidney Ace 12/24/19 2057    Lajean Saver, MD 12/25/19 [phone]

## 2019-12-25 LAB — URINALYSIS, ROUTINE W REFLEX MICROSCOPIC
Bilirubin Urine: NEGATIVE
Glucose, UA: 50 mg/dL — AB
Hgb urine dipstick: NEGATIVE
Ketones, ur: NEGATIVE mg/dL
Nitrite: NEGATIVE
Protein, ur: 100 mg/dL — AB
Specific Gravity, Urine: 1.005 (ref 1.005–1.030)
pH: 7 (ref 5.0–8.0)

## 2019-12-25 NOTE — ED Notes (Signed)
Patient verbalized discharge instructions. Pt had no further questions at this time. NAD. Patient awaiting ride.

## 2019-12-25 NOTE — Discharge Instructions (Addendum)
It was our pleasure to provide your ER care today - we hope that you feel better.  Rest, drink plenty of fluids.   Follow up with primary care doctor in the next 1-2 days if symptoms fail to improve/resolve.  Return to ER if worse, new symptoms, persistent vomiting, severe abdominal pain, trouble breathing, chest pain, or other concern.

## 2019-12-25 NOTE — ED Provider Notes (Signed)
Patient is tolerating po. No recurrent vomiting.  abd soft nt.  From labs, hco3 normal. Glucose mildly elev.  On recheck in room, hr 92. rr 14. Pulse ox is 96%.   Patient appears stable for d/c.   Rec pcp f/u.  Return precautions provided.       Lajean Saver, MD 12/25/19 [phone]

## 2020-01-03 ENCOUNTER — Encounter (HOSPITAL_COMMUNITY): Payer: Self-pay | Admitting: Obstetrics and Gynecology

## 2020-01-03 ENCOUNTER — Other Ambulatory Visit: Payer: Self-pay

## 2020-01-03 ENCOUNTER — Emergency Department (HOSPITAL_COMMUNITY): Payer: Managed Care, Other (non HMO)

## 2020-01-03 ENCOUNTER — Emergency Department (HOSPITAL_COMMUNITY)
Admission: EM | Admit: 2020-01-03 | Discharge: 2020-01-03 | Disposition: A | Discharge status: Home / Self Care | Payer: Managed Care, Other (non HMO) | Attending: Emergency Medicine | Admitting: Emergency Medicine

## 2020-01-03 DIAGNOSIS — J45909 Unspecified asthma, uncomplicated: Secondary | ICD-10-CM | POA: Insufficient documentation

## 2020-01-03 DIAGNOSIS — Z79899 Other long term (current) drug therapy: Secondary | ICD-10-CM | POA: Insufficient documentation

## 2020-01-03 DIAGNOSIS — Z91040 Latex allergy status: Secondary | ICD-10-CM | POA: Diagnosis not present

## 2020-01-03 DIAGNOSIS — R0602 Shortness of breath: Secondary | ICD-10-CM | POA: Diagnosis present

## 2020-01-03 DIAGNOSIS — E1065 Type 1 diabetes mellitus with hyperglycemia: Secondary | ICD-10-CM | POA: Diagnosis not present

## 2020-01-03 DIAGNOSIS — U071 COVID-19: Secondary | ICD-10-CM | POA: Insufficient documentation

## 2020-01-03 DIAGNOSIS — R739 Hyperglycemia, unspecified: Secondary | ICD-10-CM

## 2020-01-03 LAB — I-STAT BETA HCG BLOOD, ED (MC, WL, AP ONLY): I-stat hCG, quantitative: <5 m[IU]/mL (ref <5)

## 2020-01-03 LAB — CBC WITH DIFFERENTIAL/PLATELET
Abs Immature Granulocytes: 0.03 10*3/uL (ref 0.00–0.07)
Basophils Absolute: 0.1 10*3/uL (ref 0.0–0.1)
Basophils Relative: 1 %
Eosinophils Absolute: 0.4 10*3/uL (ref 0.0–0.5)
Eosinophils Relative: 4 %
HCT: 41.7 % (ref 36.0–46.0)
Hemoglobin: 13.8 g/dL (ref 12.0–15.0)
Immature Granulocytes: 0 %
Lymphocytes Relative: 16 %
Lymphs Abs: 1.5 10*3/uL (ref 0.7–4.0)
MCH: 30.1 pg (ref 26.0–34.0)
MCHC: 33.1 g/dL (ref 30.0–36.0)
MCV: 90.8 fL (ref 80.0–100.0)
Monocytes Absolute: 0.4 10*3/uL (ref 0.1–1.0)
Monocytes Relative: 4 %
Neutro Abs: 6.7 10*3/uL (ref 1.7–7.7)
Neutrophils Relative %: 75 %
Platelets: 514 10*3/uL — ABNORMAL HIGH (ref 150–400)
RBC: 4.59 MIL/uL (ref 3.87–5.11)
RDW: 13 % (ref 11.5–15.5)
WBC: 9 10*3/uL (ref 4.0–10.5)
nRBC: 0 % (ref 0.0–0.2)

## 2020-01-03 LAB — COMPREHENSIVE METABOLIC PANEL
ALT: 17 U/L (ref 0–44)
AST: 12 U/L — ABNORMAL LOW (ref 15–41)
Albumin: 3.3 g/dL — ABNORMAL LOW (ref 3.5–5.0)
Alkaline Phosphatase: 89 U/L (ref 38–126)
Anion gap: 10 (ref 5–15)
BUN: 19 mg/dL (ref 6–20)
CO2: 27 mmol/L (ref 22–32)
Calcium: 9 mg/dL (ref 8.9–10.3)
Chloride: 93 mmol/L — ABNORMAL LOW (ref 98–111)
Creatinine, Ser: 0.83 mg/dL (ref 0.44–1.00)
GFR calc Af Amer: >60 mL/min (ref >60)
GFR calc non Af Amer: >60 mL/min (ref >60)
Glucose, Bld: 535 mg/dL (ref 70–99)
Potassium: 4.5 mmol/L (ref 3.5–5.1)
Sodium: 130 mmol/L — ABNORMAL LOW (ref 135–145)
Total Bilirubin: 0.7 mg/dL (ref 0.3–1.2)
Total Protein: 7.8 g/dL (ref 6.5–8.1)

## 2020-01-03 LAB — CBG MONITORING, ED
Glucose-Capillary: 470 mg/dL — ABNORMAL HIGH (ref 70–99)
Glucose-Capillary: 414 mg/dL — ABNORMAL HIGH (ref 70–99)
Glucose-Capillary: 272 mg/dL — ABNORMAL HIGH (ref 70–99)

## 2020-01-03 LAB — BLOOD GAS, VENOUS
Acid-Base Excess: 1.9 mmol/L (ref 0.0–2.0)
Bicarbonate: 29.1 mmol/L — ABNORMAL HIGH (ref 20.0–28.0)
FIO2: 100
O2 Saturation: 30.2 %
Patient temperature: 98.6
pCO2, Ven: 59.5 mmHg (ref 44.0–60.0)
pH, Ven: 7.311 (ref 7.250–7.430)
pO2, Ven: <31 mmHg — CL (ref 32.0–45.0)

## 2020-01-03 LAB — LACTIC ACID, PLASMA: Lactic Acid, Venous: 0.9 mmol/L (ref 0.5–1.9)

## 2020-01-03 MED ORDER — INSULIN ASPART 100 UNIT/ML ~~LOC~~ SOLN
5.0000 [IU] | Freq: Once | SUBCUTANEOUS | Status: AC
  Administered 2020-01-03: 5 [IU] via SUBCUTANEOUS
  Filled 2020-01-03: qty 0.05

## 2020-01-03 MED ORDER — SODIUM CHLORIDE 0.9 % IV BOLUS
1000.0000 mL | Freq: Once | INTRAVENOUS | Status: AC
  Administered 2020-01-03: 1000 mL via INTRAVENOUS

## 2020-01-03 MED ORDER — INSULIN ASPART 100 UNIT/ML ~~LOC~~ SOLN
8.0000 [IU] | Freq: Once | SUBCUTANEOUS | Status: AC
  Administered 2020-01-03: 8 [IU] via SUBCUTANEOUS
  Filled 2020-01-03: qty 0.08

## 2020-01-03 MED ORDER — KETOROLAC TROMETHAMINE 15 MG/ML IJ SOLN
30.0000 mg | Freq: Once | INTRAMUSCULAR | Status: AC
  Administered 2020-01-03: 30 mg via INTRAVENOUS
  Filled 2020-01-03: qty 2

## 2020-01-03 MED ORDER — DIPHENHYDRAMINE HCL 50 MG/ML IJ SOLN
25.0000 mg | Freq: Once | INTRAMUSCULAR | Status: AC
  Administered 2020-01-03: 25 mg via INTRAVENOUS
  Filled 2020-01-03: qty 1

## 2020-01-03 MED ORDER — INSULIN GLARGINE 100 UNIT/ML ~~LOC~~ SOLN
25.0000 [IU] | Freq: Once | SUBCUTANEOUS | Status: AC
  Administered 2020-01-03: 25 [IU] via SUBCUTANEOUS
  Filled 2020-01-03: qty 0.25

## 2020-01-03 MED ORDER — PROCHLORPERAZINE EDISYLATE 10 MG/2ML IJ SOLN
10.0000 mg | Freq: Once | INTRAMUSCULAR | Status: AC
  Administered 2020-01-03: 10 mg via INTRAVENOUS
  Filled 2020-01-03: qty 2

## 2020-01-03 MED ORDER — ACETAMINOPHEN 500 MG PO TABS
1000.0000 mg | ORAL_TABLET | Freq: Once | ORAL | Status: AC
  Administered 2020-01-03: 1000 mg via ORAL
  Filled 2020-01-03: qty 2

## 2020-01-03 NOTE — ED Provider Notes (Signed)
[place] DEPT Provider Note   CSN: [id number] Arrival date & time: 01/03/20  1633     History Chief Complaint  Patient presents with  . Shortness of Breath    Diamond [name] is a 24 y.o. female.  24yo F w/ PMH including IDDM, Crohn's disease, asthma, GERD who p/w headache, cough, chest pain.  Patient was diagnosed with COVID-19 on 1/17.  She reports that she has continued to have a productive cough which may be at slightly improved.  She has had intermittent chest pain that she describes as a heaviness that comes and goes and is worse with coughing.  She reports associated shortness of breath when she does things like go upstairs but she states that this seemed to be getting better slowly.  She has not had much of an appetite but has been trying to drink fluids.  She reports that her blood glucose has been in the 200-300s recently.  She is compliant with her medications.  She denies any vomiting or diarrhea.  She comes back to the ED today stating that she feels like her head is underwater.  She has been having a pressure-like headache but she denies any sinus pressure. She has taken tylenol cold/sinus and albuterol without much improvement. No recent fevers.   The history is provided by the patient.  Shortness of Breath      Past Medical History:  Diagnosis Date  . ADHD (attention deficit hyperactivity disorder) 2007  . Allergy   . Asthma   . Crohn's colitis ([place])    Dx Dr. Cherrie Gauze  . Depression    early childhood, trial of seroquel?  . Diabetes mellitus 2008  . GERD (gastroesophageal reflux disease)     Patient Active Problem List   Diagnosis Date Noted  . Eczema 10/23/2012  . Abdominal pain 08/27/2012  . GERD (gastroesophageal reflux disease) 07/25/2012  . Mood disorder ([place]) 07/09/2012  . Contraception management 07/09/2012  . History of Crohn's disease 07/09/2012  . Diabetes mellitus type 1, uncontrolled, insulin  dependent ([place]) 07/08/2012  . Asthma, persistent controlled 07/08/2012    Past Surgical History:  Procedure Laterality Date  . ADENOIDECTOMY    . HERNIA REPAIR    . TONSILLECTOMY       OB History   No obstetric history on file.     Family History  Problem Relation Age of Onset  . Hypertension Mother   . Diabetes Mother   . Crohn's disease Father   . Alcohol abuse Father   . Drug abuse Father   . Mental illness Father   . Hypertension Maternal Aunt   . Heart disease Maternal Aunt   . Asthma Maternal Aunt   . Asthma Maternal Uncle   . Cancer Maternal Grandmother   . Diabetes Maternal Grandmother   . Mental illness Maternal Grandfather     Social History   Tobacco Use  . Smoking status: Never Smoker  . Smokeless tobacco: Never Used  Substance Use Topics  . Alcohol use: No  . Drug use: No    Home Medications Prior to Admission medications   Medication Sig Start Date End Date Taking? Authorizing Provider  albuterol (PROVENTIL HFA;VENTOLIN HFA) 108 (90 BASE) MCG/ACT inhaler Inhale 2 puffs into the lungs every 6 (six) hours as needed. For shortness of breath 07/08/12  Yes Clovis Cao, MD  insulin aspart (NOVOLOG FLEXPEN) 100 UNIT/ML FlexPen Inject 12-18 Units into the skin 3 (three) times daily with meals. 04/15/17  Yes Cassandria Anger, MD  Insulin Glargine (LANTUS SOLOSTAR) 100 UNIT/ML Solostar Pen Inject 25 Units into the skin daily at 10 pm. 04/15/17  Yes Nida, Marella Chimes, MD  B-D ULTRAFINE III SHORT PEN 31G X 8 MM MISC USE 4 TIMES A DAY 07/21/13   Hess, Tamela Oddi, DO  glucagon (GLUCAGON EMERGENCY) 1 MG injection Inject 1 mg into the vein once as needed. 03/27/17 12/24/19  Cassandria Anger, MD  glucose blood (ACCU-CHEK GUIDE) test strip Use as instructed 4 x daily. E11.65 04/03/17   Cassandria Anger, MD    Allergies    Amoxicillin, Augmentin [amoxicillin-pot clavulanate], Clavulanic acid, Adhesive [tape], Latex, Other, and Pineapple  Review of Systems    Review of Systems  Respiratory: Positive for shortness of breath.    All other systems reviewed and are negative except that which was mentioned in HPI  Physical Exam Updated Vital Signs BP (!) 89/63   Pulse (!) 103   Temp 98.2 F (36.8 C) (Oral)   Resp 13   Ht 4' 9"  (1.448 m)   Wt 49.9 kg   LMP 11/02/2019 (Approximate)   SpO2 100%   BMI 23.80 kg/m   Physical Exam Vitals and nursing note reviewed.  Constitutional:      General: She is not in acute distress.    Appearance: She is well-developed.  HENT:     Head: Normocephalic and atraumatic.  Eyes:     Conjunctiva/sclera: Conjunctivae normal.  Cardiovascular:     Rate and Rhythm: Regular rhythm. Tachycardia present.     Heart sounds: Normal heart sounds. No murmur.  Pulmonary:     Effort: Pulmonary effort is normal.     Breath sounds: Normal breath sounds.  Abdominal:     General: Bowel sounds are normal. There is no distension.     Palpations: Abdomen is soft.     Tenderness: There is no abdominal tenderness.  Musculoskeletal:     Cervical back: Neck supple.  Skin:    General: Skin is warm and dry.  Neurological:     Mental Status: She is alert and oriented to person, place, and time.     Comments: Fluent speech  Psychiatric:        Mood and Affect: Mood normal.        Judgment: Judgment normal.     ED Results / Procedures / Treatments   Labs (all labs ordered are listed, but only abnormal results are displayed) Labs Reviewed  COMPREHENSIVE METABOLIC PANEL - Abnormal; Notable for the following components:      Result Value   Sodium 130 (*)    Chloride 93 (*)    Glucose, Bld 535 (*)    Albumin 3.3 (*)    AST 12 (*)    All other components within normal limits  CBC WITH DIFFERENTIAL/PLATELET - Abnormal; Notable for the following components:   Platelets 514 (*)    All other components within normal limits  BLOOD GAS, VENOUS - Abnormal; Notable for the following components:   pO2, Ven <31.0 (*)     Bicarbonate 29.1 (*)    All other components within normal limits  CBG MONITORING, ED - Abnormal; Notable for the following components:   Glucose-Capillary 470 (*)    All other components within normal limits  CBG MONITORING, ED - Abnormal; Notable for the following components:   Glucose-Capillary 414 (*)    All other components within normal limits  CBG MONITORING, ED - Abnormal; Notable for the following  components:   Glucose-Capillary 272 (*)    All other components within normal limits  LACTIC ACID, PLASMA  LACTIC ACID, PLASMA  URINALYSIS, ROUTINE W REFLEX MICROSCOPIC  I-STAT BETA HCG BLOOD, ED ([facility], [facility], [facility] ONLY)    EKG EKG Interpretation  Date/Time:  Sunday January 03 2020 17:01:49 EST Ventricular Rate:  106 PR Interval:    QRS Duration: 74 QT Interval:  319 QTC Calculation: 424 R Axis:   82 Text Interpretation: Sinus tachycardia Borderline T abnormalities, anterior leads rate faster, otherwise similar to previous Confirmed by Theotis Burrow [phone]) on 01/03/2020 5:19:09 PM   Radiology DG Chest 2 View  Result Date: 01/03/2020 CLINICAL DATA:  Headache and shortness of breath. EXAM: CHEST - 2 VIEW COMPARISON:  December 20, 2019 FINDINGS: There is been interval resolution of the bibasilar infiltrates seen on the prior study. There is no evidence of a pleural effusion or pneumothorax. The heart size and mediastinal contours are within normal limits. Both lungs are clear. The visualized skeletal structures are unremarkable. IMPRESSION: No active cardiopulmonary disease. Electronically Signed   By: Virgina Norfolk M.D.   On: 01/03/2020 17:03    Procedures Procedures (including critical care time) CRITICAL CARE Performed by: Wenda Overland Kathryn Cosby   Total critical care time: 30 minutes  Critical care time was exclusive of separately billable procedures and treating other patients.  Critical care was necessary to treat or prevent imminent or life-threatening  deterioration.  Critical care was time spent personally by me on the following activities: development of treatment plan with patient and/or surrogate as well as nursing, discussions with consultants, evaluation of patient's response to treatment, examination of patient, obtaining history from patient or surrogate, ordering and performing treatments and interventions, ordering and review of laboratory studies, ordering and review of radiographic studies, pulse oximetry and re-evaluation of patient's condition.  Medications Ordered in ED Medications  ketorolac (TORADOL) 15 MG/ML injection 30 mg (has no administration in time range)  sodium chloride 0.9 % bolus 1,000 mL (0 mLs Intravenous Stopped 01/03/20 1951)  diphenhydrAMINE (BENADRYL) injection 25 mg (25 mg Intravenous Given 01/03/20 1733)  prochlorperazine (COMPAZINE) injection 10 mg (10 mg Intravenous Given 01/03/20 1732)  sodium chloride 0.9 % bolus 1,000 mL (0 mLs Intravenous Stopped 01/03/20 2133)  insulin aspart (novoLOG) injection 5 Units (5 Units Subcutaneous Given 01/03/20 1949)  insulin aspart (novoLOG) injection 8 Units (8 Units Subcutaneous Given 01/03/20 2156)  insulin glargine (LANTUS) injection 25 Units (25 Units Subcutaneous Given 01/03/20 2157)  acetaminophen (TYLENOL) tablet 1,000 mg (1,000 mg Oral Given 01/03/20 2205)    ED Course  I have reviewed the triage vital signs and the nursing notes.  Pertinent labs & imaging results that were available during my care of the patient were reviewed by me and considered in my medical decision making (see chart for details).    MDM Rules/Calculators/A&P                      PT non-toxic on exam w/ normal WOB, mildly tachycardic and hypertensive, normal O2 sat and no wheezing. CXR clear.  Lab work shows initial blood glucose of 535 with normal anion gap, normal CBC, reassuring VBG.  Gave the patient several fluid boluses, several doses of NovoLog, and her home Lantus.  On sequential blood  glucose checks, blood glucose improved to 272.  She is asleep and comfortable on reassessment with O2 saturation 100% on room air and normal work of breathing.  She states that she has  a mild headache for which I have given her Toradol.  I have emphasized the importance of keeping close watch on her blood glucose and staying very well-hydrated while taking all of her medications as prescribed.  I extensively reviewed return precautions with her including severe hyperglycemia or breathing problems.  She voiced understanding.  HAIDYN KILBURG was evaluated in Emergency Department on 01/03/2020 for the symptoms described in the history of present illness. She was evaluated in the context of the global COVID-19 pandemic, which necessitated consideration that the patient might be at risk for infection with the SARS-CoV-2 virus that causes COVID-19. Institutional protocols and algorithms that pertain to the evaluation of patients at risk for COVID-19 are in a state of rapid change based on information released by regulatory bodies including the CDC and federal and state organizations. These policies and algorithms were followed during the patient's care in the ED.   Final Clinical Impression(s) / ED Diagnoses Final diagnoses:  COVID-19 virus infection  Hyperglycemia    Rx / DC Orders ED Discharge Orders    None       Mekenzie Modeste, Wenda Overland, MD 01/03/20 2328

## 2020-01-03 NOTE — ED Notes (Signed)
Pt encouraged to provide urine specimen.

## 2020-01-03 NOTE — ED Triage Notes (Signed)
Patient reports headache and SOB. Patient reports she feels like she is "underwater" Patient reports she tested positive for COVID on January 17th and had pneumonia, but she is a type one diabetic and they could not give her steroids Patient reports she has since had a negative COVID test.

## 2020-01-03 NOTE — ED Notes (Signed)
Pt encouraged to provide urine specimen but states she does not need to void at this time.

## 2020-02-03 LAB — HEMOGLOBIN A1C: Hemoglobin A1C: 14

## 2020-02-29 ENCOUNTER — Encounter: Payer: Self-pay | Admitting: "Endocrinology

## 2020-02-29 ENCOUNTER — Ambulatory Visit (INDEPENDENT_AMBULATORY_CARE_PROVIDER_SITE_OTHER): Payer: Self-pay | Admitting: "Endocrinology

## 2020-02-29 ENCOUNTER — Other Ambulatory Visit: Payer: Self-pay

## 2020-02-29 VITALS — BP 131/97 | HR 108 | Ht 60.0 in | Wt 113.2 lb

## 2020-02-29 DIAGNOSIS — E1065 Type 1 diabetes mellitus with hyperglycemia: Secondary | ICD-10-CM

## 2020-02-29 MED ORDER — TRESIBA FLEXTOUCH 100 UNIT/ML ~~LOC~~ SOPN: 24.0000 [IU] | PEN_INJECTOR | Freq: Every day | SUBCUTANEOUS | 2 refills | Status: DC

## 2020-02-29 MED ORDER — LYUMJEV KWIKPEN 100 UNIT/ML ~~LOC~~ SOPN: 5.0000 [IU] | PEN_INJECTOR | Freq: Three times a day (TID) | SUBCUTANEOUS | 2 refills | Status: DC

## 2020-02-29 MED ORDER — ACCU-CHEK GUIDE W/DEVICE KIT: 1.0000 | PACK | 0 refills | Status: DC

## 2020-02-29 MED ORDER — ACCU-CHEK GUIDE VI STRP: ORAL_STRIP | 2 refills | Status: DC

## 2020-02-29 NOTE — Patient Instructions (Signed)

## 2020-02-29 NOTE — Progress Notes (Addendum)
Endocrinology Consult Note       02/29/2020, 6:04 PM   Subjective:    Patient ID: Diamond [name], female    DOB: [birth year].  Diamond [name] is being seen in consultation for management of currently uncontrolled symptomatic diabetes.  Past Medical History:  Diagnosis Date  . ADHD (attention deficit hyperactivity disorder) 2007  . Allergy   . Asthma   . Crohn's colitis ([place])    Dx Dr. Cherrie Gauze  . Depression    early childhood, trial of seroquel?  . Diabetes mellitus 2008  . GERD (gastroesophageal reflux disease)     Past Surgical History:  Procedure Laterality Date  . ADENOIDECTOMY    . HERNIA REPAIR    . TONSILLECTOMY      Social History   Socioeconomic History  . Marital status: Single    Spouse name: Not on file  . Number of children: Not on file  . Years of education: Not on file  . Highest education level: Not on file  Occupational History  . Not on file  Tobacco Use  . Smoking status: Never Smoker  . Smokeless tobacco: Never Used  Substance and Sexual Activity  . Alcohol use: No  . Drug use: No  . Sexual activity: Not Currently    Birth control/protection: Condom  Other Topics Concern  . Not on file  Social History Narrative   Lives with mother Diamond [name].    High school at [employer].               Social Determinants of Health   Financial Resource Strain:   . Difficulty of Paying Living Expenses:   Food Insecurity:   . Worried About Charity fundraiser in the Last Year:   . Arboriculturist in the Last Year:   Transportation Needs:   . Film/video editor (Medical):   Marland Kitchen Lack of Transportation (Non-Medical):   Physical Activity:   . Days of Exercise per Week:   . Minutes of Exercise per Session:   Stress:   . Feeling of Stress :   Social Connections:   . Frequency of Communication with Friends and Family:   . Frequency of Social Gatherings  with Friends and Family:   . Attends Religious Services:   . Active Member of Clubs or Organizations:   . Attends Archivist Meetings:   Marland Kitchen Marital Status:     Family History  Problem Relation Age of Onset  . Hypertension Mother   . Diabetes Mother   . Crohn's disease Father   . Alcohol abuse Father   . Drug abuse Father   . Mental illness Father   . Hypertension Maternal Aunt   . Heart disease Maternal Aunt   . Asthma Maternal Aunt   . Asthma Maternal Uncle   . Cancer Maternal Grandmother   . Diabetes Maternal Grandmother   . Mental illness Maternal Grandfather     Outpatient Encounter Medications as of 02/29/2020  Medication Sig  . cholecalciferol (VITAMIN D3) 25 MCG (1000 UNIT) tablet Take 1,000 Units by mouth daily.  Marland Kitchen zinc gluconate 50 MG tablet  Take 50 mg by mouth daily.  . [DISCONTINUED] insulin NPH-regular Human (70-30) 100 UNIT/ML injection Inject into the skin. Pt states she uses sliding scale six times a day  . albuterol (PROVENTIL HFA;VENTOLIN HFA) 108 (90 BASE) MCG/ACT inhaler Inhale 2 puffs into the lungs every 6 (six) hours as needed. For shortness of breath  . B-D ULTRAFINE III SHORT PEN 31G X 8 MM MISC USE 4 TIMES A DAY  . Blood Glucose Monitoring Suppl (ACCU-CHEK GUIDE) w/Device KIT 1 Piece by Does not apply route as directed.  Marland Kitchen glucagon (GLUCAGON EMERGENCY) 1 MG injection Inject 1 mg into the vein once as needed.  Marland Kitchen glucose blood (ACCU-CHEK GUIDE) test strip Use as instructed 4 x daily. E11.65  . glucose blood (ACCU-CHEK GUIDE) test strip Use as instructed  . hydrochlorothiazide (HYDRODIURIL) 25 MG tablet Take 25 mg by mouth every morning.  . insulin degludec (TRESIBA FLEXTOUCH) 100 UNIT/ML FlexTouch Pen Inject 0.24 mLs (24 Units total) into the skin daily.  . Insulin Lispro-aabc, 1 U Dial, (LYUMJEV KWIKPEN) 100 UNIT/ML SOPN Inject 5-8 Units into the skin 3 (three) times daily before meals.  . [DISCONTINUED] insulin aspart (NOVOLOG FLEXPEN) 100  UNIT/ML FlexPen Inject 12-18 Units into the skin 3 (three) times daily with meals.  . [DISCONTINUED] Insulin Glargine (LANTUS SOLOSTAR) 100 UNIT/ML Solostar Pen Inject 25 Units into the skin daily at 10 pm.   No facility-administered encounter medications on file as of 02/29/2020.    ALLERGIES: Allergies  Allergen Reactions  . Amoxicillin Hives    Did it involve swelling of the face/tongue/throat, SOB, or low BP? no  Did it involve sudden or severe rash/hives, skin peeling, or any reaction on the inside of your mouth or nose? no Did you need to seek medical attention at a hospital or doctor's office? no When did it last happen?unk If all above answers are "NO", may proceed with cephalosporin use.  . Augmentin [Amoxicillin-Pot Clavulanate] Other (See Comments)    Reaction unknown  . Ceftriaxone Sodium In Dextrose   . Clavulanic Acid   . Adhesive [Tape] Itching and Rash  . Latex Itching and Rash  . Other Itching and Rash  . Pineapple Rash    Rash on tongue and throat    VACCINATION STATUS: Immunization History  Administered Date(s) Administered  . HPV Quadrivalent 08/27/2012  . Hepatitis A 08/27/2012  . Influenza Split 08/27/2012  . Meningococcal Conjugate 08/27/2012    Diabetes She presents for her initial diabetic visit. She has type 1 diabetes mellitus. Onset time: She was diagnosed at approximate age of 43 years. Her disease course has been worsening. There are no hypoglycemic associated symptoms. Pertinent negatives for hypoglycemia include no confusion, headaches, pallor or seizures. Associated symptoms include blurred vision, fatigue, foot ulcerations, polydipsia and polyuria. Pertinent negatives for diabetes include no chest pain and no polyphagia. There are no hypoglycemic complications. Symptoms are worsening. Diabetic complications include peripheral neuropathy. Risk factors for coronary artery disease include diabetes mellitus and family history. Current diabetic  treatment includes insulin injections (She is currently on Novolin 25 units twice daily.). Her weight is fluctuating minimally. She is following a generally unhealthy diet. When asked about meal planning, she reported none. She has not had a previous visit with a dietitian. She never participates in exercise. (She did not bring any logs nor meter to review.  Her most recent A1c is 14% on February 03, 2020.  She did not report or document hypoglycemia. ) An ACE inhibitor/angiotensin II receptor blocker  is being taken.     Review of Systems  Constitutional: Positive for fatigue. Negative for chills, fever and unexpected weight change.  HENT: Negative for trouble swallowing and voice change.   Eyes: Positive for blurred vision. Negative for visual disturbance.  Respiratory: Negative for cough, shortness of breath and wheezing.   Cardiovascular: Negative for chest pain, palpitations and leg swelling.  Gastrointestinal: Negative for diarrhea, nausea and vomiting.  Endocrine: Positive for polydipsia and polyuria. Negative for cold intolerance, heat intolerance and polyphagia.  Musculoskeletal: Negative for arthralgias and myalgias.  Skin: Negative for color change, pallor, rash and wound.  Neurological: Negative for seizures and headaches.  Psychiatric/Behavioral: Negative for confusion and suicidal ideas.    Objective:    Vitals with BMI 02/29/2020 01/03/2020 01/03/2020  Height 5' 0"  - -  Weight 113 lbs 3 oz - -  BMI 18.56 - -  Systolic 314 - -  Diastolic 97 - -  Pulse 970 105 106    BP (!) 131/97   Pulse (!) 108   Ht 5' (1.524 m)   Wt 113 lb 3.2 oz (51.3 kg)   BMI 22.11 kg/m   Wt Readings from Last 3 Encounters:  02/29/20 113 lb 3.2 oz (51.3 kg)  01/03/20 110 lb (49.9 kg)  04/15/17 120 lb (54.4 kg)     Physical Exam Constitutional:      Appearance: She is well-developed.  HENT:     Head: Normocephalic and atraumatic.  Neck:     Thyroid: No thyromegaly.     Trachea: No tracheal  deviation.  Cardiovascular:     Rate and Rhythm: Normal rate.  Pulmonary:     Effort: Pulmonary effort is normal.  Abdominal:     Tenderness: There is no abdominal tenderness. There is no guarding.  Musculoskeletal:        General: Normal range of motion.     Cervical back: Normal range of motion and neck supple.  Skin:    General: Skin is warm and dry.     Coloration: Skin is not pale.     Findings: No erythema or rash.  Neurological:     Mental Status: She is alert and oriented to person, place, and time.     Cranial Nerves: No cranial nerve deficit.     Coordination: Coordination normal.     Deep Tendon Reflexes: Reflexes are normal and symmetric.  Psychiatric:     Comments: Patient is very reluctant.  She displays unconcerned affect.       CMP ( most recent) CMP     Component Value Date/Time   NA 130 (L) 01/03/2020 1737   K 4.5 01/03/2020 1737   CL 93 (L) 01/03/2020 1737   CO2 27 01/03/2020 1737   GLUCOSE 535 (HH) 01/03/2020 1737   BUN 19 01/03/2020 1737   CREATININE 0.83 01/03/2020 1737   CREATININE 0.69 07/08/2012 1203   CALCIUM 9.0 01/03/2020 1737   PROT 7.8 01/03/2020 1737   ALBUMIN 3.3 (L) 01/03/2020 1737   AST 12 (L) 01/03/2020 1737   ALT 17 01/03/2020 1737   ALKPHOS 89 01/03/2020 1737   BILITOT 0.7 01/03/2020 1737   GFRNONAA >60 01/03/2020 1737   GFRAA >60 01/03/2020 1737     Diabetic Labs (most recent): Lab Results  Component Value Date   HGBA1C 14.0 02/03/2020   HGBA1C 13.2 03/20/2017   HGBA1C 10.1 10/23/2012    Lab Results  Component Value Date   TSH 1.181 07/08/2012      Assessment &  Plan:   1. Uncontrolled type 1 diabetes mellitus with hyperglycemia (HCC) - RAESHA COONROD has currently uncontrolled symptomatic type 1 DM since  24 years of age. -She was previously seen in this clinic, disappeared neuropathy care.  She returns with A1c of 14% on February 03, 2020.  - Recent labs reviewed. - I had a long discussion with her about the  progressive nature of diabetes and the pathology behind its complications. -her diabetes is complicated by chronic nonadherence/noncompliance, peripheral neuropathy and she remains at a high risk for more acute and chronic complications which include CAD, CVA, CKD, retinopathy, and neuropathy. These are all discussed in detail with her.  - I have counseled her on diet management  by adopting a carbohydrate restricted/protein rich diet. Patient is encouraged to switch to  unprocessed or minimally processed     complex starch and increased protein intake (animal or plant source), fruits, and vegetables. -  she is advised to stick to a routine mealtimes to eat 3 meals  a day and avoid unnecessary snacks ( to snack only to correct hypoglycemia).   - she admits that there is a room for improvement in her food and drink choices. - Suggestion is made for her to avoid simple carbohydrates  from her diet including Cakes, Sweet Desserts, Ice Cream, Soda (diet and regular), Sweet Tea, Candies, Chips, Cookies, Store Bought Juices, Alcohol in Excess of  1-2 drinks a day, Artificial Sweeteners,  Coffee Creamer, and "Sugar-free" Products. This will help patient to have more stable blood glucose profile and potentially avoid unintended weight gain.  - she will be scheduled with Jearld Fenton, RDN, CDE for diabetes education.  - I have approached her with the following individualized plan to manage  her diabetes and patient agrees:   - she will need multiple daily injections of basal/bolus insulin in order for her to achieve control of diabetes to therapy.   -Her insurance, Christella Scheuermann prefers [place] and aspart insulin  Lyumjev instead of NovoLog and Lantus.    -I discussed initiated Tresiba  24 units daily at bedtime , and prandial insulin  Lyuemjev  5 units 3 times a day with meals  for pre-meal BG readings of 90-139m/dl, plus patient specific correction dose for unexpected hyperglycemia above 1572mdl, associated  with strict monitoring of glucose 4 times a day-before meals and at bedtime. - she is warned not to take insulin without proper monitoring per orders. - Adjustment parameters are given to her for hypo and hyperglycemia in writing. - she is encouraged to call clinic for blood glucose levels less than 70 or above 300 mg /dl. - her Novolin 70/30 will be discontinued, not ideal choice for managing type 1 diabetes.  - she is not a candidate for metformin, SGLT2 inhibitors, nor incretin therapy.   - Specific targets for  A1c;  LDL, HDL,  and Triglycerides were discussed with the patient.  2) Blood Pressure /Hypertension:  her blood pressure is not controlled to target.   she is advised to continue her current medications including hydrochlorothiazide 25 mg p.o. daily with breakfast . 3) Lipids/Hyperlipidemia: She does not have a recent lipid panel to review.  She will be considered for fasting lipid panel on subsequent visits.   4)  Weight/Diet:  Body mass index is 22.11 kg/m.  -She is not a candidate for weight loss.  Exercise, and detailed carbohydrates information provided  -  detailed on discharge instructions.  5) Chronic Care/Health Maintenance:  -she  is not  on ACEI/ARB and Statin medications and  is encouraged to initiate and continue to follow up with Ophthalmology, Dentist,  Podiatrist at least yearly or according to recommendations, and advised to   stay away from smoking. I have recommended yearly flu vaccine and pneumonia vaccine at least every 5 years; moderate intensity exercise for up to 150 minutes weekly; and  sleep for at least 7 hours a day.  - she is  advised to maintain close follow up with her PCP for primary care needs, as well as her other providers for optimal and coordinated care.   - Time spent in this patient care: 60 min, of which > 50% was spent in  counseling  her about her currently and chronically uncontrolled type 1 diabetes, hypertension and the rest reviewing  her blood glucose logs , discussing her hypoglycemia and hyperglycemia episodes, reviewing her current and  previous labs / studies  ( including abstraction from other facilities) and medications  doses and developing a  long term treatment plan based on the latest standards of care/ guidelines; and documenting her care.    Please refer to Patient Instructions for Blood Glucose Monitoring and Insulin/Medications Dosing Guide"  in media tab for additional information. Please  also refer to " Patient Self Inventory" in the Media  tab for reviewed elements of pertinent patient history.  [name] participated in the discussions, expressed understanding, and voiced agreement with the above plans.  All questions were answered to her satisfaction. she is encouraged to contact clinic should she have any questions or concerns prior to her return visit.   Follow up plan: - Return in about 2 weeks (around 03/14/2020) for Follow up with Meter and Logs Only - no Labs.  Glade Lloyd, MD [facility] [address] [place], [place] [address] Phone: [phone]  Fax: [phone]    02/29/2020, 6:04 PM  This note was partially dictated with voice recognition software. Similar sounding words can be transcribed inadequately or may not  be corrected upon review.

## 2020-03-17 ENCOUNTER — Ambulatory Visit: Payer: Medicaid Other | Admitting: "Endocrinology

## 2020-03-22 ENCOUNTER — Telehealth: Payer: Self-pay | Admitting: Adult Health

## 2020-03-22 NOTE — Telephone Encounter (Signed)

## 2020-03-23 ENCOUNTER — Other Ambulatory Visit: Payer: Managed Care, Other (non HMO) | Admitting: Adult Health

## 2020-04-17 DIAGNOSIS — A419 Sepsis, unspecified organism: Secondary | ICD-10-CM | POA: Insufficient documentation

## 2020-04-18 DIAGNOSIS — J4520 Mild intermittent asthma, uncomplicated: Secondary | ICD-10-CM | POA: Insufficient documentation

## 2020-04-24 DIAGNOSIS — L0291 Cutaneous abscess, unspecified: Secondary | ICD-10-CM | POA: Insufficient documentation

## 2020-04-30 ENCOUNTER — Other Ambulatory Visit: Payer: Self-pay

## 2020-04-30 ENCOUNTER — Emergency Department (HOSPITAL_COMMUNITY)
Admission: EM | Admit: 2020-04-30 | Discharge: 2020-04-30 | Disposition: A | Discharge status: Home / Self Care | Payer: Managed Care, Other (non HMO) | Attending: Emergency Medicine | Admitting: Emergency Medicine

## 2020-04-30 ENCOUNTER — Emergency Department (HOSPITAL_COMMUNITY): Payer: Managed Care, Other (non HMO)

## 2020-04-30 ENCOUNTER — Encounter (HOSPITAL_COMMUNITY): Payer: Self-pay | Admitting: *Deleted

## 2020-04-30 DIAGNOSIS — Z794 Long term (current) use of insulin: Secondary | ICD-10-CM | POA: Insufficient documentation

## 2020-04-30 DIAGNOSIS — E109 Type 1 diabetes mellitus without complications: Secondary | ICD-10-CM

## 2020-04-30 DIAGNOSIS — E10649 Type 1 diabetes mellitus with hypoglycemia without coma: Secondary | ICD-10-CM | POA: Insufficient documentation

## 2020-04-30 DIAGNOSIS — M7989 Other specified soft tissue disorders: Secondary | ICD-10-CM

## 2020-04-30 DIAGNOSIS — R2243 Localized swelling, mass and lump, lower limb, bilateral: Secondary | ICD-10-CM | POA: Insufficient documentation

## 2020-04-30 LAB — CBC WITH DIFFERENTIAL/PLATELET
Abs Immature Granulocytes: 0.02 10*3/uL (ref 0.00–0.07)
Basophils Absolute: 0.1 10*3/uL (ref 0.0–0.1)
Basophils Relative: 1 %
Eosinophils Absolute: 0.9 10*3/uL — ABNORMAL HIGH (ref 0.0–0.5)
Eosinophils Relative: 12 %
HCT: 35 % — ABNORMAL LOW (ref 36.0–46.0)
Hemoglobin: 11 g/dL — ABNORMAL LOW (ref 12.0–15.0)
Immature Granulocytes: 0 %
Lymphocytes Relative: 28 %
Lymphs Abs: 2.1 10*3/uL (ref 0.7–4.0)
MCH: 30.1 pg (ref 26.0–34.0)
MCHC: 31.4 g/dL (ref 30.0–36.0)
MCV: 95.9 fL (ref 80.0–100.0)
Monocytes Absolute: 0.3 10*3/uL (ref 0.1–1.0)
Monocytes Relative: 4 %
Neutro Abs: 4.2 10*3/uL (ref 1.7–7.7)
Neutrophils Relative %: 55 %
Platelets: 425 10*3/uL — ABNORMAL HIGH (ref 150–400)
RBC: 3.65 MIL/uL — ABNORMAL LOW (ref 3.87–5.11)
RDW: 13.4 % (ref 11.5–15.5)
WBC: 7.7 10*3/uL (ref 4.0–10.5)
nRBC: 0 % (ref 0.0–0.2)

## 2020-04-30 LAB — I-STAT BETA HCG BLOOD, ED (MC, WL, AP ONLY): I-stat hCG, quantitative: <5 m[IU]/mL (ref <5)

## 2020-04-30 LAB — COMPREHENSIVE METABOLIC PANEL
ALT: 94 U/L — ABNORMAL HIGH (ref 0–44)
AST: 47 U/L — ABNORMAL HIGH (ref 15–41)
Albumin: 3.3 g/dL — ABNORMAL LOW (ref 3.5–5.0)
Alkaline Phosphatase: 135 U/L — ABNORMAL HIGH (ref 38–126)
Anion gap: 8 (ref 5–15)
BUN: 14 mg/dL (ref 6–20)
CO2: 24 mmol/L (ref 22–32)
Calcium: 9 mg/dL (ref 8.9–10.3)
Chloride: 105 mmol/L (ref 98–111)
Creatinine, Ser: 0.66 mg/dL (ref 0.44–1.00)
GFR calc Af Amer: >60 mL/min (ref >60)
GFR calc non Af Amer: >60 mL/min (ref >60)
Glucose, Bld: 53 mg/dL — ABNORMAL LOW (ref 70–99)
Potassium: 3.4 mmol/L — ABNORMAL LOW (ref 3.5–5.1)
Sodium: 137 mmol/L (ref 135–145)
Total Bilirubin: 0.7 mg/dL (ref 0.3–1.2)
Total Protein: 8 g/dL (ref 6.5–8.1)

## 2020-04-30 LAB — CBG MONITORING, ED
Glucose-Capillary: 79 mg/dL (ref 70–99)
Glucose-Capillary: 53 mg/dL — ABNORMAL LOW (ref 70–99)
Glucose-Capillary: 52 mg/dL — ABNORMAL LOW (ref 70–99)
Glucose-Capillary: 143 mg/dL — ABNORMAL HIGH (ref 70–99)
Glucose-Capillary: 197 mg/dL — ABNORMAL HIGH (ref 70–99)

## 2020-04-30 LAB — BRAIN NATRIURETIC PEPTIDE: B Natriuretic Peptide: 30 pg/mL (ref 0.0–100.0)

## 2020-04-30 LAB — TROPONIN I (HIGH SENSITIVITY): Troponin I (High Sensitivity): 8 ng/L (ref <18)

## 2020-04-30 MED ORDER — DEXTROSE 50 % IV SOLN
50.0000 mL | Freq: Once | INTRAVENOUS | Status: AC
  Administered 2020-04-30: 50 mL via INTRAVENOUS

## 2020-04-30 MED ORDER — DEXTROSE 50 % IV SOLN
INTRAVENOUS | Status: AC
  Administered 2020-04-30: 50 mL
  Filled 2020-04-30: qty 50

## 2020-04-30 MED ORDER — DEXTROSE 50 % IV SOLN
INTRAVENOUS | Status: AC
  Filled 2020-04-30: qty 50

## 2020-04-30 NOTE — ED Provider Notes (Signed)
[facility] EMERGENCY DEPARTMENT Provider Note   CSN: [id number] Arrival date & time: 04/30/20  1624     History Chief Complaint  Patient presents with  . Foot Swelling    Diamond [name] is a 24 y.o. female.  Patient is a 24 year old female with history of poorly controlled type 1 diabetes, ADHD, GERD.  Patient presenting today with complaints of pain and swelling in her legs.  She reports edema from her feet into her back that has been present intermittently for the past week.  She denies to me she is having trouble breathing.  She denies fevers, chills, or productive cough.  Patient has recently been admitted to both [facility] and [facility] in the past week.  She initially presented just over 1 week ago with sores to her legs.  She was showing SIRS criteria, then was admitted for IV antibiotics.  While in the hospital, she underwent additional work-up including TTE, hemoglobin A1c of 16.6, and also had what sounds like a retinal hemorrhage which was injected/treated by ophthalmology.  The history is provided by the patient.       Past Medical History:  Diagnosis Date  . ADHD (attention deficit hyperactivity disorder) 2007  . Allergy   . Asthma   . Crohn's colitis ([place])    Dx Dr. Cherrie Gauze  . Depression    early childhood, trial of seroquel?  . Diabetes mellitus 2008  . GERD (gastroesophageal reflux disease)     Patient Active Problem List   Diagnosis Date Noted  . Eczema 10/23/2012  . Abdominal pain 08/27/2012  . GERD (gastroesophageal reflux disease) 07/25/2012  . Mood disorder ([place]) 07/09/2012  . Contraception management 07/09/2012  . History of Crohn's disease 07/09/2012  . Uncontrolled type 1 diabetes mellitus with hyperglycemia ([place]) 07/08/2012  . Asthma, persistent controlled 07/08/2012    Past Surgical History:  Procedure Laterality Date  . ADENOIDECTOMY    . HERNIA REPAIR    . TONSILLECTOMY       OB History   No  obstetric history on file.     Family History  Problem Relation Age of Onset  . Hypertension Mother   . Diabetes Mother   . Crohn's disease Father   . Alcohol abuse Father   . Drug abuse Father   . Mental illness Father   . Hypertension Maternal Aunt   . Heart disease Maternal Aunt   . Asthma Maternal Aunt   . Asthma Maternal Uncle   . Cancer Maternal Grandmother   . Diabetes Maternal Grandmother   . Mental illness Maternal Grandfather     Social History   Tobacco Use  . Smoking status: Never Smoker  . Smokeless tobacco: Never Used  Substance Use Topics  . Alcohol use: No  . Drug use: No    Home Medications Prior to Admission medications   Medication Sig Start Date End Date Taking? Authorizing Provider  albuterol (PROVENTIL HFA;VENTOLIN HFA) 108 (90 BASE) MCG/ACT inhaler Inhale 2 puffs into the lungs every 6 (six) hours as needed. For shortness of breath 07/08/12   Clovis Cao, MD  B-D ULTRAFINE III SHORT PEN 31G X 8 MM MISC USE 4 TIMES A DAY 07/21/13   Hess, Tamela Oddi, DO  Blood Glucose Monitoring Suppl (ACCU-CHEK GUIDE) w/Device KIT 1 Piece by Does not apply route as directed. 02/29/20   Cassandria Anger, MD  cholecalciferol (VITAMIN D3) 25 MCG (1000 UNIT) tablet Take 1,000 Units by mouth daily.  [provider]  glucagon (GLUCAGON EMERGENCY) 1 MG injection Inject 1 mg into the vein once as needed. 03/27/17 12/24/19  Cassandria Anger, MD  glucose blood (ACCU-CHEK GUIDE) test strip Use as instructed 4 x daily. E11.65 04/03/17   Cassandria Anger, MD  glucose blood (ACCU-CHEK GUIDE) test strip Use as instructed 02/29/20   Cassandria Anger, MD  hydrochlorothiazide (HYDRODIURIL) 25 MG tablet Take 25 mg by mouth every morning. 02/11/20   [provider]  insulin degludec (TRESIBA FLEXTOUCH) 100 UNIT/ML FlexTouch Pen Inject 0.24 mLs (24 Units total) into the skin daily. 02/29/20   Cassandria Anger, MD  Insulin Lispro-aabc, 1 U Dial, (LYUMJEV  KWIKPEN) 100 UNIT/ML SOPN Inject 5-8 Units into the skin 3 (three) times daily before meals. 02/29/20   Cassandria Anger, MD  zinc gluconate 50 MG tablet Take 50 mg by mouth daily.    [provider]    Allergies    Amoxicillin, Augmentin [amoxicillin-pot clavulanate], Ceftriaxone sodium in dextrose, Clavulanic acid, Adhesive [tape], Latex, Other, and Pineapple  Review of Systems   Review of Systems  All other systems reviewed and are negative.   Physical Exam Updated Vital Signs BP 121/88 (BP Location: Left Arm)   Pulse (!) 115   Temp 98.7 F (37.1 C) (Oral)   Resp 14   Ht 5' (1.524 m)   Wt 56.7 kg   SpO2 100%   BMI 24.41 kg/m   Physical Exam Vitals and nursing note reviewed.  Constitutional:      General: She is not in acute distress.    Appearance: She is well-developed. She is not diaphoretic.  HENT:     Head: Normocephalic and atraumatic.  Cardiovascular:     Rate and Rhythm: Normal rate and regular rhythm.     Heart sounds: No murmur. No friction rub. No gallop.   Pulmonary:     Effort: Pulmonary effort is normal. No respiratory distress.     Breath sounds: Normal breath sounds. No wheezing.  Abdominal:     General: Bowel sounds are normal. There is no distension.     Palpations: Abdomen is soft.     Tenderness: There is no abdominal tenderness.  Musculoskeletal:        General: Normal range of motion.     Cervical back: Normal range of motion and neck supple.     Right lower leg: Edema present.     Left lower leg: Edema present.  Skin:    General: Skin is warm and dry.  Neurological:     Mental Status: She is alert and oriented to person, place, and time.     ED Results / Procedures / Treatments   Labs (all labs ordered are listed, but only abnormal results are displayed) Labs Reviewed  COMPREHENSIVE METABOLIC PANEL  CBC WITH DIFFERENTIAL/PLATELET  BRAIN NATRIURETIC PEPTIDE  URINALYSIS, ROUTINE W REFLEX MICROSCOPIC  CBG MONITORING, ED    I-STAT BETA HCG BLOOD, ED ([facility], [facility], [facility] ONLY)  TROPONIN I (HIGH SENSITIVITY)    EKG None  Radiology No results found.  Procedures Procedures (including critical care time)  Medications Ordered in ED Medications - No data to display  ED Course  I have reviewed the triage vital signs and the nursing notes.  Pertinent labs & imaging results that were available during my care of the patient were reviewed by me and considered in my medical decision making (see chart for details).    MDM Rules/Calculators/A&P  Patient is a 24 year old  female with history of poorly controlled diabetes.  Patient recently admitted on 2 separate occasions at [facility] and [facility].  During one visit, she was felt to be possibly septic and received volume resuscitation and IV antibiotics.  Patient has been experiencing increased swelling of her legs since.    This was worked up during her second hospitalization with DVT studies, PE studies, echocardiogram, and no definitive cause was found.  She presents here with ongoing swelling.  Her vitals are stable and she appears comfortable otherwise.  Today's work-up reveals no significant laboratory abnormalities with the exception of a blood sugar in the 50s.  She received D50 on 2 separate occasions as well as food and drink.  Her blood sugars have now stabilized and she appears well.  There is no evidence in today's work-up for congestive heart failure and I feel as though venous thromboembolism has been ruled out at other facilities.  I will recommend she increase her dose of Lasix from 20 mg to 40 mg daily, then follow-up with primary doctor.  At this point, I feel as though discharge is appropriate, but I feel she could benefit from tighter control of her diabetes and will make referral to endocrinology.  Upon review of her outside record, her hemoglobin A1c was 16.6.    Final Clinical Impression(s) / ED Diagnoses Final diagnoses:  None    Rx / DC  Orders ED Discharge Orders    None       Veryl Speak, MD 04/30/20 2057

## 2020-04-30 NOTE — ED Notes (Signed)
ED Provider at bedside. 

## 2020-04-30 NOTE — ED Triage Notes (Signed)
Pt with swelling from lower back to feet with swelling.  +N/V

## 2020-04-30 NOTE — Discharge Instructions (Addendum)
Continue medications as previously prescribed, but increase your dose of Lasix to 40 mg daily.  Follow-up with endocrinology to discuss tighter control of your diabetes.  The contact information for [facility] has been provided in this discharge summary for you to call and make these arrangements.

## 2020-04-30 NOTE — ED Notes (Signed)
Pt reports having been seen at [facility] [place], [place] and here is the last 2 weeks  Reports she was told she had neuropathy   Pt lower extremities jerking, she reports low blood sugar at home at 75  She report N   Type 1 DM since age 24   Has not had endocrine follow per her report

## 2020-05-04 ENCOUNTER — Other Ambulatory Visit: Payer: Self-pay

## 2020-05-06 ENCOUNTER — Encounter: Payer: Self-pay | Admitting: Internal Medicine

## 2020-05-06 ENCOUNTER — Ambulatory Visit (INDEPENDENT_AMBULATORY_CARE_PROVIDER_SITE_OTHER): Payer: Managed Care, Other (non HMO) | Admitting: Internal Medicine

## 2020-05-06 DIAGNOSIS — Z5329 Procedure and treatment not carried out because of patient's decision for other reasons: Secondary | ICD-10-CM

## 2020-05-06 NOTE — Progress Notes (Signed)
Patient ID: TANEE HENERY, female   DOB: 1996/10/15, 24 y.o.   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 She previously saw Dr. Dorris Fetch, but would like to change providers.  Patient was admitted 03/13/2020 in DKA  -at that time, an HbA1c was 14.3% and she had an increased lipase. She was also admitted 04/23/2020 for hypoglycemia-at that time an HbA1c was 14.9%.  Of note, she had COVID-19 in 12/2019.  Reviewed HbA1c levels: 04/24/2020: HbA1c 14.9% 03/13/2020: HbA1c 14.3% Lab Results  Component Value Date   HGBA1C 14.0 02/03/2020   HGBA1C 13.2 03/20/2017   HGBA1C 10.1 10/23/2012   She is on the following regimen: - Tresiba 24 units daily - Lyumjev 5-8 units 3x a day before meals She was previously on Lantus and NovoLog  Meter: Accu-Chek guide  Pt checks her sugars ** a day and they are: - am:  - 2h after b'fast: - before lunch: - 2h after lunch: - before dinner: - 2h after dinner: - bedtime: - nighttime: Lowest sugar was **; she has hypoglycemia awareness at 70.  ** a glucagon kit at home. No previous hypoglycemia admission.  Highest sugar was **. + previous DKA admissions.   Pt's meals are: - Breakfast: - Lunch: - Dinner: - Snacks:  - no CKD, last BUN/creatinine:  Lab Results  Component Value Date   BUN 14 04/30/2020   BUN 19 01/03/2020   CREATININE 0.66 04/30/2020   CREATININE 0.83 01/03/2020   - last set of lipids: No results found for: CHOL, HDL, LDLCALC, LDLDIRECT, TRIG, CHOLHDL - last eye exam was in **. No DR.  - no numbness and tingling in her feet.  Last TSH: Lab Results  Component Value Date   TSH 1.181 07/08/2012   Pt has FH of DM in mother and MGM.  Of note, she also has a history of Crohn's  disease, GERD.  ROS: Constitutional: no weight gain, no weight loss, no fatigue, no subjective hyperthermia, no subjective hypothermia, no nocturia Eyes: no blurry vision, no xerophthalmia ENT: no sore throat, no nodules palpated in neck, no dysphagia, no odynophagia, no hoarseness, no tinnitus, no hypoacusis Cardiovascular: no CP, no SOB, no palpitations, no leg swelling Respiratory: no cough, no SOB, no wheezing Gastrointestinal: no N, no V, no D, no C, no acid reflux Musculoskeletal: no muscle, no joint aches Skin: no rash, no hair loss Neurological: no tremors, no numbness or tingling/no dizziness/no HAs Psychiatric: no depression, no anxiety  PE: There were no vitals taken for this visit. Wt Readings from Last 3 Encounters:  04/30/20 125 lb (56.7 kg)  02/29/20 113 lb 3.2 oz (51.3 kg)  01/03/20 110 lb (49.9 kg)   Constitutional: normal weight, in NAD Eyes: PERRLA, EOMI, no exophthalmos ENT: moist mucous membranes, no thyromegaly, no cervical lymphadenopathy Cardiovascular: RRR, No MRG Respiratory: CTA B Gastrointestinal: abdomen soft, NT, ND, BS+ Musculoskeletal: no deformities, strength intact in all 4 Skin: moist, warm, no rashes Neurological: no tremor with outstretched hands, DTR normal in all 4  ASSESSMENT: 1. DM1, uncontrolled, with complications - PDR OU - h/o DKA  PLAN:  1. Patient with long-standing, uncontrolled DM1. - I suggested to:  There are no Patient Instructions on file for this visit. - Strongly advised her to start checking  sugars at different times of the day - check at least 4 times a day, rotating checks - given sugar log and advised how to fill it and to bring it at next appt  - given foot care handout and explained the principles  - given instructions for hypoglycemia management "15-15 rule"  - advised for yearly eye exams - sent glucagon kit Rx to pharmacy - advised to get ketone strips - advised to always have Glu tablets with her -  advised for a Med-alert bracelet mentioning "type 1 diabetes mellitus". - given instruction Re: exercising and driving in DM1 (pt instructions) - also, given information about sick day rules - no signs of other autoimmune disorders - Return to clinic in 3 mo with sugar log   Philemon Kingdom, MD PhD [facility]

## 2020-06-03 ENCOUNTER — Ambulatory Visit: Payer: Managed Care, Other (non HMO) | Admitting: Internal Medicine

## 2020-06-20 DIAGNOSIS — L0231 Cutaneous abscess of buttock: Secondary | ICD-10-CM | POA: Insufficient documentation

## 2020-06-20 DIAGNOSIS — L03317 Cellulitis of buttock: Secondary | ICD-10-CM | POA: Insufficient documentation

## 2020-07-03 DIAGNOSIS — I639 Cerebral infarction, unspecified: Secondary | ICD-10-CM

## 2020-07-03 HISTORY — DX: Cerebral infarction, unspecified: I63.9

## 2020-07-08 DIAGNOSIS — A4901 Methicillin susceptible Staphylococcus aureus infection, unspecified site: Secondary | ICD-10-CM | POA: Insufficient documentation

## 2020-08-03 ENCOUNTER — Encounter: Payer: Self-pay | Admitting: Internal Medicine

## 2020-08-06 DIAGNOSIS — K567 Ileus, unspecified: Secondary | ICD-10-CM | POA: Insufficient documentation

## 2020-08-25 ENCOUNTER — Other Ambulatory Visit: Payer: Self-pay

## 2020-08-25 ENCOUNTER — Inpatient Hospital Stay (HOSPITAL_COMMUNITY)
Admission: EM | Admit: 2020-08-25 | Discharge: 2020-08-26 | DRG: 637 | Disposition: A | Discharge status: Home / Self Care | Payer: Self-pay | Attending: Internal Medicine | Admitting: Internal Medicine

## 2020-08-25 ENCOUNTER — Encounter (HOSPITAL_COMMUNITY): Payer: Self-pay | Admitting: *Deleted

## 2020-08-25 ENCOUNTER — Emergency Department (HOSPITAL_COMMUNITY): Payer: Self-pay

## 2020-08-25 DIAGNOSIS — E860 Dehydration: Secondary | ICD-10-CM | POA: Diagnosis present

## 2020-08-25 DIAGNOSIS — K219 Gastro-esophageal reflux disease without esophagitis: Secondary | ICD-10-CM | POA: Diagnosis present

## 2020-08-25 DIAGNOSIS — Z809 Family history of malignant neoplasm, unspecified: Secondary | ICD-10-CM

## 2020-08-25 DIAGNOSIS — K5090 Crohn's disease, unspecified, without complications: Secondary | ICD-10-CM | POA: Diagnosis present

## 2020-08-25 DIAGNOSIS — J45909 Unspecified asthma, uncomplicated: Secondary | ICD-10-CM | POA: Diagnosis present

## 2020-08-25 DIAGNOSIS — Z794 Long term (current) use of insulin: Secondary | ICD-10-CM

## 2020-08-25 DIAGNOSIS — I10 Essential (primary) hypertension: Secondary | ICD-10-CM | POA: Diagnosis present

## 2020-08-25 DIAGNOSIS — E870 Hyperosmolality and hypernatremia: Secondary | ICD-10-CM

## 2020-08-25 DIAGNOSIS — Z91040 Latex allergy status: Secondary | ICD-10-CM

## 2020-08-25 DIAGNOSIS — J189 Pneumonia, unspecified organism: Secondary | ICD-10-CM | POA: Diagnosis present

## 2020-08-25 DIAGNOSIS — E1065 Type 1 diabetes mellitus with hyperglycemia: Principal | ICD-10-CM | POA: Diagnosis present

## 2020-08-25 DIAGNOSIS — Z881 Allergy status to other antibiotic agents status: Secondary | ICD-10-CM

## 2020-08-25 DIAGNOSIS — J45998 Other asthma: Secondary | ICD-10-CM | POA: Diagnosis present

## 2020-08-25 DIAGNOSIS — Z811 Family history of alcohol abuse and dependence: Secondary | ICD-10-CM

## 2020-08-25 DIAGNOSIS — Z91120 Patient's intentional underdosing of medication regimen due to financial hardship: Secondary | ICD-10-CM

## 2020-08-25 DIAGNOSIS — Z8249 Family history of ischemic heart disease and other diseases of the circulatory system: Secondary | ICD-10-CM

## 2020-08-25 DIAGNOSIS — Z888 Allergy status to other drugs, medicaments and biological substances status: Secondary | ICD-10-CM

## 2020-08-25 DIAGNOSIS — F909 Attention-deficit hyperactivity disorder, unspecified type: Secondary | ICD-10-CM | POA: Diagnosis present

## 2020-08-25 DIAGNOSIS — Z79899 Other long term (current) drug therapy: Secondary | ICD-10-CM

## 2020-08-25 DIAGNOSIS — R600 Localized edema: Secondary | ICD-10-CM | POA: Diagnosis present

## 2020-08-25 DIAGNOSIS — Z8719 Personal history of other diseases of the digestive system: Secondary | ICD-10-CM

## 2020-08-25 DIAGNOSIS — Z8673 Personal history of transient ischemic attack (TIA), and cerebral infarction without residual deficits: Secondary | ICD-10-CM

## 2020-08-25 DIAGNOSIS — Z880 Allergy status to penicillin: Secondary | ICD-10-CM

## 2020-08-25 DIAGNOSIS — F329 Major depressive disorder, single episode, unspecified: Secondary | ICD-10-CM | POA: Diagnosis present

## 2020-08-25 DIAGNOSIS — F39 Unspecified mood [affective] disorder: Secondary | ICD-10-CM | POA: Diagnosis present

## 2020-08-25 DIAGNOSIS — Z818 Family history of other mental and behavioral disorders: Secondary | ICD-10-CM

## 2020-08-25 DIAGNOSIS — Z833 Family history of diabetes mellitus: Secondary | ICD-10-CM

## 2020-08-25 DIAGNOSIS — Z20822 Contact with and (suspected) exposure to covid-19: Secondary | ICD-10-CM | POA: Diagnosis present

## 2020-08-25 DIAGNOSIS — Z825 Family history of asthma and other chronic lower respiratory diseases: Secondary | ICD-10-CM

## 2020-08-25 DIAGNOSIS — Z91018 Allergy to other foods: Secondary | ICD-10-CM

## 2020-08-25 DIAGNOSIS — Z813 Family history of other psychoactive substance abuse and dependence: Secondary | ICD-10-CM

## 2020-08-25 DIAGNOSIS — R739 Hyperglycemia, unspecified: Secondary | ICD-10-CM

## 2020-08-25 LAB — HEPATIC FUNCTION PANEL
ALT: 17 U/L (ref 0–44)
AST: 15 U/L (ref 15–41)
Albumin: 2.5 g/dL — ABNORMAL LOW (ref 3.5–5.0)
Alkaline Phosphatase: 121 U/L (ref 38–126)
Bilirubin, Direct: 0.1 mg/dL (ref 0.0–0.2)
Indirect Bilirubin: 0.8 mg/dL (ref 0.3–0.9)
Total Bilirubin: 0.9 mg/dL (ref 0.3–1.2)
Total Protein: 7.1 g/dL (ref 6.5–8.1)

## 2020-08-25 LAB — URINALYSIS, ROUTINE W REFLEX MICROSCOPIC
Bacteria, UA: NONE SEEN
Bilirubin Urine: NEGATIVE
Glucose, UA: >=500 mg/dL — AB
Ketones, ur: 5 mg/dL — AB
Leukocytes,Ua: NEGATIVE
Nitrite: NEGATIVE
Protein, ur: 30 mg/dL — AB
Specific Gravity, Urine: 1.021 (ref 1.005–1.030)
pH: 6 (ref 5.0–8.0)

## 2020-08-25 LAB — CBG MONITORING, ED
Glucose-Capillary: >600 mg/dL (ref 70–99)
Glucose-Capillary: >600 mg/dL (ref 70–99)
Glucose-Capillary: 459 mg/dL — ABNORMAL HIGH (ref 70–99)
Glucose-Capillary: 512 mg/dL (ref 70–99)
Glucose-Capillary: 338 mg/dL — ABNORMAL HIGH (ref 70–99)
Glucose-Capillary: 414 mg/dL — ABNORMAL HIGH (ref 70–99)
Glucose-Capillary: 205 mg/dL — ABNORMAL HIGH (ref 70–99)

## 2020-08-25 LAB — RESPIRATORY PANEL BY RT PCR (FLU A&B, COVID)
Influenza A by PCR: NEGATIVE
Influenza B by PCR: NEGATIVE
SARS Coronavirus 2 by RT PCR: NEGATIVE

## 2020-08-25 LAB — BASIC METABOLIC PANEL
Anion gap: 11 (ref 5–15)
BUN: 7 mg/dL (ref 6–20)
CO2: 24 mmol/L (ref 22–32)
Calcium: 8.8 mg/dL — ABNORMAL LOW (ref 8.9–10.3)
Chloride: 92 mmol/L — ABNORMAL LOW (ref 98–111)
Creatinine, Ser: 0.96 mg/dL (ref 0.44–1.00)
GFR calc Af Amer: >60 mL/min (ref >60)
GFR calc non Af Amer: >60 mL/min (ref >60)
Glucose, Bld: 816 mg/dL (ref 70–99)
Potassium: 3.9 mmol/L (ref 3.5–5.1)
Sodium: 127 mmol/L — ABNORMAL LOW (ref 135–145)

## 2020-08-25 LAB — CBC
HCT: 40.4 % (ref 36.0–46.0)
Hemoglobin: 12.9 g/dL (ref 12.0–15.0)
MCH: 28.8 pg (ref 26.0–34.0)
MCHC: 31.9 g/dL (ref 30.0–36.0)
MCV: 90.2 fL (ref 80.0–100.0)
Platelets: 356 10*3/uL (ref 150–400)
RBC: 4.48 MIL/uL (ref 3.87–5.11)
RDW: 14.6 % (ref 11.5–15.5)
WBC: 10.7 10*3/uL — ABNORMAL HIGH (ref 4.0–10.5)
nRBC: 0 % (ref 0.0–0.2)

## 2020-08-25 LAB — TSH: TSH: 1.078 u[IU]/mL (ref 0.350–4.500)

## 2020-08-25 LAB — TROPONIN I (HIGH SENSITIVITY)
Troponin I (High Sensitivity): 15 ng/L (ref <18)
Troponin I (High Sensitivity): 14 ng/L (ref <18)

## 2020-08-25 LAB — LIPASE, BLOOD: Lipase: 22 U/L (ref 11–51)

## 2020-08-25 LAB — POC URINE PREG, ED: Preg Test, Ur: NEGATIVE

## 2020-08-25 MED ORDER — ONDANSETRON HCL 4 MG/2ML IJ SOLN
4.0000 mg | Freq: Once | INTRAMUSCULAR | Status: AC
  Administered 2020-08-25: 4 mg via INTRAVENOUS
  Filled 2020-08-25: qty 2

## 2020-08-25 MED ORDER — POTASSIUM CHLORIDE 10 MEQ/100ML IV SOLN
10.0000 meq | INTRAVENOUS | Status: AC
  Administered 2020-08-25 (×2): 10 meq via INTRAVENOUS
  Filled 2020-08-25 (×2): qty 100

## 2020-08-25 MED ORDER — ATORVASTATIN CALCIUM 10 MG PO TABS
20.0000 mg | ORAL_TABLET | Freq: Every day | ORAL | Status: DC
  Administered 2020-08-26: 20 mg via ORAL
  Filled 2020-08-25: qty 2

## 2020-08-25 MED ORDER — TRAMADOL HCL 50 MG PO TABS
50.0000 mg | ORAL_TABLET | Freq: Once | ORAL | Status: AC
  Administered 2020-08-25: 50 mg via ORAL
  Filled 2020-08-25: qty 1

## 2020-08-25 MED ORDER — DEXTROSE 50 % IV SOLN: 0.0000 mL | INTRAVENOUS | Status: DC | PRN

## 2020-08-25 MED ORDER — LEVOFLOXACIN 500 MG PO TABS
500.0000 mg | ORAL_TABLET | Freq: Once | ORAL | Status: AC
  Administered 2020-08-25: 500 mg via ORAL
  Filled 2020-08-25: qty 1

## 2020-08-25 MED ORDER — DEXTROSE IN LACTATED RINGERS 5 % IV SOLN: INTRAVENOUS | Status: DC

## 2020-08-25 MED ORDER — SODIUM CHLORIDE 0.9 % IV SOLN: INTRAVENOUS | Status: DC

## 2020-08-25 MED ORDER — LIDOCAINE VISCOUS HCL 2 % MT SOLN
15.0000 mL | Freq: Once | OROMUCOSAL | Status: AC
  Administered 2020-08-25: 15 mL via ORAL
  Filled 2020-08-25: qty 15

## 2020-08-25 MED ORDER — DIPHENHYDRAMINE HCL 50 MG/ML IJ SOLN
25.0000 mg | Freq: Once | INTRAMUSCULAR | Status: AC
  Administered 2020-08-25: 25 mg via INTRAVENOUS
  Filled 2020-08-25: qty 1

## 2020-08-25 MED ORDER — GABAPENTIN 100 MG PO CAPS
100.0000 mg | ORAL_CAPSULE | Freq: Three times a day (TID) | ORAL | Status: DC
  Administered 2020-08-26: 100 mg via ORAL
  Filled 2020-08-25: qty 1

## 2020-08-25 MED ORDER — LEVOFLOXACIN IN D5W 750 MG/150ML IV SOLN
750.0000 mg | Freq: Once | INTRAVENOUS | Status: DC
  Filled 2020-08-25: qty 150

## 2020-08-25 MED ORDER — INSULIN REGULAR(HUMAN) IN NACL 100-0.9 UT/100ML-% IV SOLN
INTRAVENOUS | Status: DC
  Administered 2020-08-25: 11.5 [IU]/h via INTRAVENOUS
  Filled 2020-08-25 (×3): qty 100

## 2020-08-25 MED ORDER — ALUM & MAG HYDROXIDE-SIMETH 200-200-20 MG/5ML PO SUSP
30.0000 mL | Freq: Once | ORAL | Status: AC
  Administered 2020-08-25: 30 mL via ORAL
  Filled 2020-08-25: qty 30

## 2020-08-25 MED ORDER — INSULIN REGULAR(HUMAN) IN NACL 100-0.9 UT/100ML-% IV SOLN
INTRAVENOUS | Status: DC
  Administered 2020-08-25: 4 [IU]/h via INTRAVENOUS

## 2020-08-25 MED ORDER — ENOXAPARIN SODIUM 40 MG/0.4ML ~~LOC~~ SOLN
40.0000 mg | SUBCUTANEOUS | Status: DC
  Administered 2020-08-26: 40 mg via SUBCUTANEOUS
  Filled 2020-08-25: qty 0.4

## 2020-08-25 MED ORDER — LACTATED RINGERS IV BOLUS
20.0000 mL/kg | Freq: Once | INTRAVENOUS | Status: AC
  Administered 2020-08-25: 1134 mL via INTRAVENOUS

## 2020-08-25 MED ORDER — LACTATED RINGERS IV SOLN: INTRAVENOUS | Status: DC

## 2020-08-25 NOTE — H&P (Addendum)
History and Physical    Diamond [name] YKD:[id number] DOB: November 16, 1996 DOA: 08/25/2020  PCP: Dara Lords, NP   Patient coming from: Home  I have personally briefly reviewed patient's old medical records in [place]  Chief Complaint: Chest pain  HPI: Diamond [name] is a 24 y.o. female with medical history significant for type 1 diabetes mellitus, asthma, Crohn's disease, stroke, depression. Patient presented to the ED today with complaints of chest pain, bilateral chest pains, but worse on the right, with cough productive of grayish sputum.  She reports cough has been ongoing over the past month.  She reports difficulty breathing and chills over the past few days. She tells me she has been compliant with her insulins, she last took her Tyler Aas last night 25 units, and her NovoLin this afternoon sliding scale.  She reports no oral intake in 2 days. Patient also has bilateral lower extremity swelling up to her knees, she also tells me her vulvar is swollen, her arms and face are all swollen.  She reports she is normally quite thin.  She is supposed to be on Lasix 20 mg daily, but she ran out.  Patient reports she has no insurance, she is finding it hard to afford her medications.  She reports multiple episodes of watery stools today.  No vomiting.  She denies specific abdominal pain but reports generalized body aches.  Multiple recent hospitalizations at [facility]. Last hospitalization 8/4-8/5-DKA. Other hospitalizations for hypoglycemi with acute gastroenteritis, hyperglycemia and buttock abscess due to MSSA requiring I&D and antibiotics.  As of today she tells me her buttock abscess has healed.  ED Course: T-max 99.  Heart rate up to 118, blood pressure 120s to 130s.  O2 sats greater than 94% on room air.  Blood glucose 816, WBC 10.7.  Sodium 127.  Stable creatinine 0.9.  Normal TSH and lipase.  Troponin XV.  Portable chest x-ray showed left supra and infrahilar opacities  consistent with pneumonia.  EKG shows sinus tachycardia.  Patient was started on insulin.  1.2 L of Ringer's lactate given.  Hospitalist to admit for hyperglycemia possibly HHS.  Review of Systems: As per HPI all other systems reviewed and negative.  Past Medical History:  Diagnosis Date  . ADHD (attention deficit hyperactivity disorder) 2007  . Allergy   . Asthma   . Crohn's colitis ([place])    Dx Dr. Cherrie Gauze  . Depression    early childhood, trial of seroquel?  . Diabetes mellitus 2008  . GERD (gastroesophageal reflux disease)   . Stroke [facility])     Past Surgical History:  Procedure Laterality Date  . ADENOIDECTOMY    . HERNIA REPAIR    . TONSILLECTOMY       reports that she has never smoked. She has never used smokeless tobacco. She reports that she does not drink alcohol and does not use drugs.  Allergies  Allergen Reactions  . Amoxicillin Hives    Did it involve swelling of the face/tongue/throat, SOB, or low BP? no  Did it involve sudden or severe rash/hives, skin peeling, or any reaction on the inside of your mouth or nose? no Did you need to seek medical attention at a hospital or doctor's office? no When did it last happen?unk If all above answers are "NO", may proceed with cephalosporin use.  . Augmentin [Amoxicillin-Pot Clavulanate] Other (See Comments)    Reaction unknown  . Ceftriaxone Sodium In Dextrose   . Clavulanic Acid   .  Adhesive [Tape] Itching and Rash  . Latex Itching and Rash  . Other Itching and Rash  . Pineapple Rash    Rash on tongue and throat    Family History  Problem Relation Age of Onset  . Hypertension Mother   . Diabetes Mother   . Crohn's disease Father   . Alcohol abuse Father   . Drug abuse Father   . Mental illness Father   . Hypertension Maternal Aunt   . Heart disease Maternal Aunt   . Asthma Maternal Aunt   . Asthma Maternal Uncle   . Cancer Maternal Grandmother   . Diabetes Maternal Grandmother   .  Mental illness Maternal Grandfather     Prior to Admission medications   Medication Sig Start Date End Date Taking? Authorizing Provider  albuterol (PROVENTIL HFA;VENTOLIN HFA) 108 (90 BASE) MCG/ACT inhaler Inhale 2 puffs into the lungs every 6 (six) hours as needed. For shortness of breath 07/08/12   Clovis Cao, MD  B-D ULTRAFINE III SHORT PEN 31G X 8 MM MISC USE 4 TIMES A DAY 07/21/13   Hess, Tamela Oddi, DO  Blood Glucose Monitoring Suppl (ACCU-CHEK GUIDE) w/Device KIT 1 Piece by Does not apply route as directed. 02/29/20   Cassandria Anger, MD  celecoxib (CELEBREX) 200 MG capsule Take 200 mg by mouth 2 (two) times daily. 04/21/20   [provider]  cholecalciferol (VITAMIN D3) 25 MCG (1000 UNIT) tablet Take 1,000 Units by mouth daily.    [provider]  clindamycin (CLEOCIN) 300 MG capsule Take 300 mg by mouth 4 (four) times daily. 04/29/20   [provider]  erythromycin ophthalmic ointment See admin instructions. Place a 1/2 inch ribbon of ointment into the lower eyelid left eye 3 times a day. 04/29/20   [provider]  furosemide (LASIX) 20 MG tablet Take 1 tablet by mouth daily. 04/29/20   [provider]  gabapentin (NEURONTIN) 100 MG capsule Take 1 capsule by mouth 3 (three) times daily. 04/29/20   [provider]  glucagon (GLUCAGON EMERGENCY) 1 MG injection Inject 1 mg into the vein once as needed. 03/27/17 12/24/19  Cassandria Anger, MD  glucose blood (ACCU-CHEK GUIDE) test strip Use as instructed 4 x daily. E11.65 04/03/17   Cassandria Anger, MD  HUMALOG KWIKPEN 100 UNIT/ML KwikPen Inject 6-10 Units into the skin 3 (three) times daily. 04/29/20   [provider]  hydrochlorothiazide (HYDRODIURIL) 25 MG tablet Take 25 mg by mouth every morning. 02/11/20   [provider]  insulin degludec (TRESIBA FLEXTOUCH) 100 UNIT/ML FlexTouch Pen Inject 0.24 mLs (24 Units total) into the skin daily. 02/29/20   Cassandria Anger, MD  Insulin Glargine (BASAGLAR KWIKPEN) 100 UNIT/ML Inject 30 Units into the skin at bedtime. 04/21/20   [provider]  Insulin Lispro-aabc, 1 U Dial, (LYUMJEV KWIKPEN) 100 UNIT/ML SOPN Inject 5-8 Units into the skin 3 (three) times daily before meals. 02/29/20   Cassandria Anger, MD  ketorolac (ACULAR) 0.5 % ophthalmic solution Place 2 drops into the left eye every 6 (six) hours as needed. 04/29/20   [provider]  lisinopril (ZESTRIL) 10 MG tablet Take 1 tablet by mouth daily. 04/29/20   [provider]  magnesium oxide (MAG-OX) 400 (241.3 Mg) MG tablet Take 1 tablet by mouth daily. 04/21/20   [provider]  medroxyPROGESTERone Acetate 150 MG/ML SUSY Inject 150 mg into the muscle every 3 (three) months. 03/02/20   [provider]  venlafaxine XR (EFFEXOR-XR) 37.5 MG 24 hr capsule Take 37.5 mg by mouth at bedtime. 04/29/20   [provider]  verapamil (CALAN-SR) 120 MG CR tablet Take 1 tablet by mouth at bedtime. 04/29/20   [provider]  zinc gluconate 50 MG tablet Take 50 mg by mouth daily.    [provider]    Physical Exam: Vitals:   08/25/20 1816 08/25/20 1830 08/25/20 1845 08/25/20 1850  BP:  (!) 134/103    Pulse:  (!) 115 (!) 117   Resp:  11 16   Temp:    99 F (37.2 C)  TempSrc:    Rectal  SpO2:  97% 99%   Weight: 56.7 kg     Height: 4' 11"  (1.499 m)       Constitutional: NAD, calm, comfortable Vitals:   08/25/20 1816 08/25/20 1830 08/25/20 1845 08/25/20 1850  BP:  (!) 134/103    Pulse:  (!) 115 (!) 117   Resp:  11 16   Temp:    99 F (37.2 C)  TempSrc:    Rectal  SpO2:  97% 99%   Weight: 56.7 kg     Height: 4' 11"  (1.499 m)      Eyes: Eyes, face appears mildly puffy, PERRL, lids and conjunctivae normal ENMT: Mucous membranes are moist.  Neck: normal, supple, no masses, no thyromegaly Respiratory: Normal respiratory effort. No accessory muscle use.  Cardiovascular:  Tachycardic, regular rate and rhythm, 2+ pitting extremity edema to knees, bilateral lower extremity appear warm and well perfused.  Abdomen: no tenderness, no masses palpated. No hepatosplenomegaly. Bowel sounds positive.  Musculoskeletal: no clubbing / cyanosis.  no joint deformity upper and lower extremities. Good ROM, no contractures. Normal muscle tone.  Skin: no rashes, lesions, ulcers. No induration Neurologic: No apparent cranial nerve abnormality, moving extremities spontaneously Psychiatric: Normal judgment and insight. Alert and oriented x 3. Normal mood.   Labs on Admission: I have personally reviewed following labs and imaging studies  CBC: Recent Labs  Lab 08/25/20 1754  WBC 10.7*  HGB 12.9  HCT 40.4  MCV 90.2  PLT 952   Basic Metabolic Panel: Recent Labs  Lab 08/25/20 1754  NA 127*  K 3.9  CL 92*  CO2 24  GLUCOSE 816*  BUN 7  CREATININE 0.96  CALCIUM 8.8*    Recent Labs  Lab 08/25/20 1754  LIPASE 22   CBG: Recent Labs  Lab 08/25/20 1843 08/25/20 2004  GLUCAP >600* >600*   Thyroid Function Tests: Recent Labs    08/25/20 1754  TSH 1.078   Urine analysis:    Component Value Date/Time   COLORURINE YELLOW 12/24/2019 2346   APPEARANCEUR CLEAR 12/24/2019 2346   LABSPEC 1.005 12/24/2019 2346   PHURINE 7.0 12/24/2019 2346   GLUCOSEU 50 (A) 12/24/2019 2346   HGBUR NEGATIVE 12/24/2019 2346   BILIRUBINUR NEGATIVE 12/24/2019 2346   KETONESUR NEGATIVE 12/24/2019 2346   PROTEINUR 100 (A) 12/24/2019 2346   UROBILINOGEN 0.2 09/13/2012 2014   NITRITE NEGATIVE 12/24/2019 2346   LEUKOCYTESUR TRACE (A) 12/24/2019 2346    Radiological Exams on Admission: DG Chest 2 View  Result Date: 08/25/2020 CLINICAL DATA:  Chest pain with productive cough. EXAM: CHEST - 2 VIEW COMPARISON:  Radiograph 04/30/2020.  CT 06/25/2020 FINDINGS: Patchy opacity in the left suprahilar and infrahilar lung suspicious for pneumonia. The heart is normal in size with normal  mediastinal contours. No pleural effusion or pneumothorax. No pulmonary edema. No acute osseous abnormalities are seen. IMPRESSION:  Patchy left suprahilar and infrahilar lung opacity suspicious for pneumonia. Electronically Signed   By: Keith Rake M.D.   On: 08/25/2020 18:23    EKG: Independently reviewed.  Sinus tachycardia rate 112.  Nonspecific T wave abnormalities in V3 through V6.  QTc 439.  Assessment/Plan Principal Problem:   Hyperglycemia Active Problems:   Pneumonia   Asthma, persistent controlled   Mood disorder (HCC)   History of Crohn's disease   Bilateral lower extremity edema  Hyperglycemia versus HHS in uncontrolled diabetic- blood glucose in the 800s.  Possibly due to pneumonia, noncompliance.  Patient denies noncompliance at this time.  Prior hospitalizations for similar and DKA at [facility].  Anion gap 11, serum bicarb 24.   - Home medications Tresiba 25 units nightly, Novolin sliding scale.  She is no longer on Lantus though listed on home medication list. -Follow-up serum osmolality - Continue insulin drip -1.5 L Ringer's lactate given, continue N/s 100cc/hr -Monitor electrolytes closely, BMP, CBC daily -She reports pain and swelling involving her uvula when she  when she takes IV KCl.  She reports she normally gets KCl with IV Benadryl.  IV Benadryl 25 mg x 1 ordered with KCl infusion. -Hemoglobin A1c -Hold home insulins -Resume gabapentin  Pneumonia-productive cough, difficulty breathing, T-max 99.  WBC 10.7.  Tachycardic to 120s.  Chest pain.  She does not meet sepsis criteria.  Tachycardia likely secondary to dehydration, no oral intake in 2 days.  Portable chest x-ray shows left suprahilar and infrahilar opacity suspicious for pneumonia. -Continue IV Levaquin (penicillin allergy noted, hives) -Follow-up Covid test -Mucolytics scheduled -Albuterol inhaler as needed  Lower extremity swelling/anasarca-2+ bilateral lower extremity swelling to knees, patient  also reports vulvar swelling and abdominal extremity facial swelling.  On Lasix 20 mg daily.  Serum albumin low 2.5.  Suspect patient has nephrotic syndrome, from chronic uncontrolled diabetes mellitus.  Creatinine 0.9, at baseline.  She ran out of 20 mg daily Lasix.  Per care everywhere echo 06/2020-EF 60 to 65%. -At this time blood sugar is markedly elevated, she needs IV fluids, despite anasarca, will continue fluids.  - Would benefit from diuresis in the near future. -She was supposed to follow-up with a nephrologist, consider inpatient nephrology evaluation prior to discharge. -Follow-up UA -Check urine protein creatinine ratio  Pseudohyponatremia-sodium 127, corrected to 138.  -IV fluids.  Loose stools, history of Crohn's.- Reports multiple watery loose stools today.  Abdominal exam benign.  No leukocytosis.  No vomiting. -Stool for C. Difficile  Hypertension-stable.  Per Care Everywhere patient is not on HCTZ lisinopril verapamil as listed on medication list -Med reconciliation pending  Depression -Resume home Effexor  DVT prophylaxis: Lovenox Code Status: Full code Family Communication: None at bedside. Disposition Plan:  > 2  Consults called: None Admission status: Inpatient, stepdown. I certify that at the point of admission it is my clinical judgment that the patient will require inpatient hospital care spanning beyond 2 midnights from the point of admission due to high intensity of service, high risk for further deterioration and high frequency of surveillance required.   Bethena Roys MD [facility]  08/25/2020, 9:46 PM

## 2020-08-25 NOTE — ED Triage Notes (Signed)
States she has chest pain with productive cough

## 2020-08-25 NOTE — ED Provider Notes (Signed)
[facility] EMERGENCY DEPARTMENT Provider Note   CSN: [id number] Arrival date & time: 08/25/20  1729     History Chief Complaint  Patient presents with  . Chest Pain  . Cough    Diamond [name] is a 24 y.o. female.  HPI   Patient with significant medical history of type 1 diabetes on insulin, asthma, Crohn's, GERD presents to the emergency department chief complaint of chest pain whole-body swelling  And cough.  Patient states she has been experiencing chest pain for the last 2 months.  She states the pain is episodic, feels like a sharp sensation in the middle of her chest which does not radiate, she does not become diaphoretic, denies nausea, vomiting, paresthesias or weakness the upper lower extremities.  She states the pain only lasts for about 2 hours and then goes away on her own, she does states she feels more short of breath when she is walking around.  Patient also endorses whole body swelling for the last 2 months.  She states she has been to the emergency department multiple times and they have not been able to 3 out what is causing this.  She states she has pain in her legs as well as her abdomen from all the swelling, she denies any alleviating or aggravating factors at this time.  Patient states she has been taking over-the-counter insulin as instructed.  She does endorse that when she is in pain she will not eat and this will cause her glucose to elevate.  Patient also complains of productive cough for the last 2 weeks, subjective fevers and chills.  She has had Covid, denies any recent sick contacts or recent travels.  Patient denies headaches, nasal congestion, nausea, vomiting, diarrhea, dysuria.   Past Medical History:  Diagnosis Date  . ADHD (attention deficit hyperactivity disorder) 2007  . Allergy   . Asthma   . Crohn's colitis ([place])    Dx Dr. Cherrie Gauze  . Depression    early childhood, trial of seroquel?  . Diabetes mellitus 2008  . GERD  (gastroesophageal reflux disease)   . Stroke [facility])     Patient Active Problem List   Diagnosis Date Noted  . Hyperglycemia 08/25/2020  . Eczema 10/23/2012  . Abdominal pain 08/27/2012  . GERD (gastroesophageal reflux disease) 07/25/2012  . Mood disorder ([place]) 07/09/2012  . Contraception management 07/09/2012  . History of Crohn's disease 07/09/2012  . Uncontrolled type 1 diabetes mellitus with hyperglycemia ([place]) 07/08/2012  . Asthma, persistent controlled 07/08/2012    Past Surgical History:  Procedure Laterality Date  . ADENOIDECTOMY    . HERNIA REPAIR    . TONSILLECTOMY       OB History   No obstetric history on file.     Family History  Problem Relation Age of Onset  . Hypertension Mother   . Diabetes Mother   . Crohn's disease Father   . Alcohol abuse Father   . Drug abuse Father   . Mental illness Father   . Hypertension Maternal Aunt   . Heart disease Maternal Aunt   . Asthma Maternal Aunt   . Asthma Maternal Uncle   . Cancer Maternal Grandmother   . Diabetes Maternal Grandmother   . Mental illness Maternal Grandfather     Social History   Tobacco Use  . Smoking status: Never Smoker  . Smokeless tobacco: Never Used  Substance Use Topics  . Alcohol use: No  . Drug use: No    Home Medications  Prior to Admission medications   Medication Sig Start Date End Date Taking? Authorizing Provider  albuterol (PROVENTIL HFA;VENTOLIN HFA) 108 (90 BASE) MCG/ACT inhaler Inhale 2 puffs into the lungs every 6 (six) hours as needed. For shortness of breath 07/08/12   Clovis Cao, MD  B-D ULTRAFINE III SHORT PEN 31G X 8 MM MISC USE 4 TIMES A DAY 07/21/13   Hess, Tamela Oddi, DO  Blood Glucose Monitoring Suppl (ACCU-CHEK GUIDE) w/Device KIT 1 Piece by Does not apply route as directed. 02/29/20   Cassandria Anger, MD  celecoxib (CELEBREX) 200 MG capsule Take 200 mg by mouth 2 (two) times daily. 04/21/20   [provider]  cholecalciferol (VITAMIN D3) 25 MCG  (1000 UNIT) tablet Take 1,000 Units by mouth daily.    [provider]  clindamycin (CLEOCIN) 300 MG capsule Take 300 mg by mouth 4 (four) times daily. 04/29/20   [provider]  erythromycin ophthalmic ointment See admin instructions. Place a 1/2 inch ribbon of ointment into the lower eyelid left eye 3 times a day. 04/29/20   [provider]  furosemide (LASIX) 20 MG tablet Take 1 tablet by mouth daily. 04/29/20   [provider]  gabapentin (NEURONTIN) 100 MG capsule Take 1 capsule by mouth 3 (three) times daily. 04/29/20   [provider]  glucagon (GLUCAGON EMERGENCY) 1 MG injection Inject 1 mg into the vein once as needed. 03/27/17 12/24/19  Cassandria Anger, MD  glucose blood (ACCU-CHEK GUIDE) test strip Use as instructed 4 x daily. E11.65 04/03/17   Cassandria Anger, MD  HUMALOG KWIKPEN 100 UNIT/ML KwikPen Inject 6-10 Units into the skin 3 (three) times daily. 04/29/20   [provider]  hydrochlorothiazide (HYDRODIURIL) 25 MG tablet Take 25 mg by mouth every morning. 02/11/20   [provider]  insulin degludec (TRESIBA FLEXTOUCH) 100 UNIT/ML FlexTouch Pen Inject 0.24 mLs (24 Units total) into the skin daily. 02/29/20   Cassandria Anger, MD  Insulin Glargine (BASAGLAR KWIKPEN) 100 UNIT/ML Inject 30 Units into the skin at bedtime. 04/21/20   [provider]  Insulin Lispro-aabc, 1 U Dial, (LYUMJEV KWIKPEN) 100 UNIT/ML SOPN Inject 5-8 Units into the skin 3 (three) times daily before meals. 02/29/20   Cassandria Anger, MD  ketorolac (ACULAR) 0.5 % ophthalmic solution Place 2 drops into the left eye every 6 (six) hours as needed. 04/29/20   [provider]  lisinopril (ZESTRIL) 10 MG tablet Take 1 tablet by mouth daily. 04/29/20   [provider]  magnesium oxide (MAG-OX) 400 (241.3 Mg) MG tablet Take 1 tablet by mouth daily. 04/21/20   [provider]  medroxyPROGESTERone Acetate 150 MG/ML SUSY  Inject 150 mg into the muscle every 3 (three) months. 03/02/20   [provider]  venlafaxine XR (EFFEXOR-XR) 37.5 MG 24 hr capsule Take 37.5 mg by mouth at bedtime. 04/29/20   [provider]  verapamil (CALAN-SR) 120 MG CR tablet Take 1 tablet by mouth at bedtime. 04/29/20   [provider]  zinc gluconate 50 MG tablet Take 50 mg by mouth daily.    [provider]    Allergies    Amoxicillin, Augmentin [amoxicillin-pot clavulanate], Ceftriaxone sodium in dextrose, Clavulanic acid, Adhesive [tape], Latex, Other, and Pineapple  Review of Systems   Review of Systems  Constitutional: Positive for chills and fever.  HENT: Positive for sore throat. Negative for congestion.   Eyes: Negative for visual disturbance.  Respiratory: Positive for cough and  shortness of breath.   Cardiovascular: Positive for chest pain and leg swelling.  Gastrointestinal: Positive for abdominal pain. Negative for diarrhea, nausea and vomiting.  Genitourinary: Negative for dysuria, enuresis, flank pain and vaginal bleeding.  Musculoskeletal: Negative for back pain.  Skin: Negative for rash.  Neurological: Negative for dizziness and headaches.  Hematological: Does not bruise/bleed easily.    Physical Exam Updated Vital Signs BP (!) 134/103   Pulse (!) 117   Temp 99 F (37.2 C) (Rectal)   Resp 16   Ht 4' 11"  (1.499 m)   Wt 56.7 kg   SpO2 99%   BMI 25.25 kg/m   Physical Exam Vitals and nursing note reviewed.  Constitutional:      General: She is in acute distress.     Appearance: She is ill-appearing.  HENT:     Head: Normocephalic and atraumatic.     Nose: Congestion present. No rhinorrhea.     Mouth/Throat:     Mouth: Mucous membranes are moist.     Pharynx: Oropharynx is clear. No oropharyngeal exudate or posterior oropharyngeal erythema.  Eyes:     General: No scleral icterus. Cardiovascular:     Rate and Rhythm: Normal rate and regular rhythm.     Pulses:  Normal pulses.     Heart sounds: No murmur heard.  No friction rub. No gallop.   Pulmonary:     Effort: No respiratory distress.     Breath sounds: No wheezing, rhonchi or rales.  Abdominal:     General: There is no distension.     Palpations: Abdomen is soft.     Tenderness: There is abdominal tenderness. There is no right CVA tenderness, left CVA tenderness or guarding.     Comments: Patient abdomen was visualized, nondistended, normoactive bowel sounds, dull to percussion, tenderness to palpation in the epigastric region, no acute abdomen noted.  Musculoskeletal:        General: Swelling present.     Right lower leg: Edema present.     Left lower leg: Edema present.     Comments: Patient had bilateral edema up to the shins, 2+.  No lesions, lacerations, or other gross abnormalities noted.  Neurovascular fully intact.  Skin:    General: Skin is warm and dry.     Findings: No rash.  Neurological:     General: No focal deficit present.     Mental Status: She is alert.  Psychiatric:        Mood and Affect: Mood normal.     ED Results / Procedures / Treatments   Labs (all labs ordered are listed, but only abnormal results are displayed) Labs Reviewed  BASIC METABOLIC PANEL - Abnormal; Notable for the following components:      Result Value   Sodium 127 (*)    Chloride 92 (*)    Glucose, Bld 816 (*)    Calcium 8.8 (*)    All other components within normal limits  CBC - Abnormal; Notable for the following components:   WBC 10.7 (*)    All other components within normal limits  CBG MONITORING, ED - Abnormal; Notable for the following components:   Glucose-Capillary >600 (*)    All other components within normal limits  CBG MONITORING, ED - Abnormal; Notable for the following components:   Glucose-Capillary >600 (*)    All other components within normal limits  RESPIRATORY PANEL BY RT PCR (FLU A&B, COVID)  CULTURE, BLOOD (ROUTINE X 2)  CULTURE, BLOOD (ROUTINE X  2)  TSH   LIPASE, BLOOD  URINALYSIS, ROUTINE W REFLEX MICROSCOPIC  OSMOLALITY  HEPATIC FUNCTION PANEL  HEMOGLOBIN A1C  POC URINE PREG, ED  TROPONIN I (HIGH SENSITIVITY)  TROPONIN I (HIGH SENSITIVITY)    EKG EKG Interpretation  Date/Time:  Thursday August 25 2020 17:40:33 EDT Ventricular Rate:  112 PR Interval:  120 QRS Duration: 80 QT Interval:  322 QTC Calculation: 439 R Axis:   96 Text Interpretation: Sinus tachycardia Rightward axis T wave abnormality, consider inferior ischemia Abnormal ECG Confirmed by Milton Ferguson [phone]) on 08/25/2020 7:50:03 PM   Radiology DG Chest 2 View  Result Date: 08/25/2020 CLINICAL DATA:  Chest pain with productive cough. EXAM: CHEST - 2 VIEW COMPARISON:  Radiograph 04/30/2020.  CT 06/25/2020 FINDINGS: Patchy opacity in the left suprahilar and infrahilar lung suspicious for pneumonia. The heart is normal in size with normal mediastinal contours. No pleural effusion or pneumothorax. No pulmonary edema. No acute osseous abnormalities are seen. IMPRESSION: Patchy left suprahilar and infrahilar lung opacity suspicious for pneumonia. Electronically Signed   By: Keith Rake M.D.   On: 08/25/2020 18:23    Procedures .Critical Care Performed by: Marcello Fennel, PA-C Authorized by: Marcello Fennel, PA-C   Critical care provider statement:    Critical care time (minutes):  30   Critical care was necessary to treat or prevent imminent or life-threatening deterioration of the following conditions:  Endocrine crisis (HHS)   Critical care was time spent personally by me on the following activities:  Discussions with consultants, evaluation of patient's response to treatment, examination of patient, ordering and performing treatments and interventions, ordering and review of laboratory studies, ordering and review of radiographic studies, pulse oximetry, re-evaluation of patient's condition, obtaining history from patient or surrogate and review of old  charts   I assumed direction of critical care for this patient from another provider in my specialty: no     (including critical care time)  Medications Ordered in ED Medications  insulin regular, human (MYXREDLIN) 100 units/ 100 mL infusion (11.5 Units/hr Intravenous New Bag/Given 08/25/20 2012)  lactated ringers infusion ( Intravenous New Bag/Given 08/25/20 2014)  dextrose 5 % in lactated ringers infusion (0 mLs Intravenous Hold 08/25/20 1936)  dextrose 50 % solution 0-50 mL (has no administration in time range)  potassium chloride 10 mEq in 100 mL IVPB (0 mEq Intravenous Hold 08/25/20 2014)  alum & mag hydroxide-simeth (MAALOX/MYLANTA) 200-200-20 MG/5ML suspension 30 mL (30 mLs Oral Given 08/25/20 1956)    And  lidocaine (XYLOCAINE) 2 % viscous mouth solution 15 mL (15 mLs Oral Given 08/25/20 1956)  ondansetron (ZOFRAN) injection 4 mg (4 mg Intravenous Given 08/25/20 1955)  lactated ringers bolus 1,134 mL (1,134 mLs Intravenous New Bag/Given 08/25/20 2015)  levofloxacin (LEVAQUIN) tablet 500 mg (500 mg Oral Given 08/25/20 1954)    ED Course  I have reviewed the triage vital signs and the nursing notes.  Pertinent labs & imaging results that were available during my care of the patient were reviewed by me and considered in my medical decision making (see chart for details).    MDM Rules/Calculators/A&P                          I have personally reviewed all imaging, labs and have interpreted them.  Patient presents to the emergency department with chief complaint of chest pain, cough, full body swelling.  On exam she appeared to be in acute distress, ill-appearing,  vital significant for tachycardia.  Will order lab work, chest x-ray, EKG for reevaluation.  Will start patient on GI cocktail, and antiemetics for epigastric pain and nausea.  CBG shows glucose greater  than 600, BMP shows hyponatremia of 127, no signs of metabolic acidosis, glucose of 861, no AKI, no anion gap.  Patient has HHS  will start on fluids and insulin drip.  CBC shows leukocytosis of 10.7, no signs of anemia.  Chest x-ray shows left patchy suprahilar and infrahilar lung opacities suspicious for pneumonia.  We will start her on Levaquin as she has an allergy to amoxicillin as well as ceftriaxone.  EKG does not show signs of ischemia, no ST elevation or depression noted.  Due hyperglycemia as well as possible community-acquired pneumonia will consult hospitalist for further recommendation evaluation.  Spoke with Dr.Emokpea she agrees patient should be admitted for further evaluation.  She will come down evaluate the patient.  I have low suspicion for systemic infection as patient was nontoxic-appearing, vital signs reassuring, no signs of obvious infection on exam.  I suspect leukocytosis is secondary to community-acquired pneumonia will start her on antibiotics.  She does not meet sepsis criteria.  Low suspicion for acute cardiac abnormality as patient has little risk factors, history would be atypical as chest pain has gone on for 2 months, substernal without associated symptoms.  I suspect pain is secondary to acid reflux.  Low suspicion for an acute abdominal abnormality requiring surgical intervention as patient is tolerating p.o., no acute abdomen noted on exam.  Low suspicion for pancreatitis as lipase is 22.  I suspect patient's symptoms of  general malaise, body aches, are secondary to her hyperglycemia.  Patient's cough, congestion, is most likely secondary to me acquired pneumonia.  Patient appears to be stable, vital signs reassuring, patient care will be  transferred to hospitalist team for further management. Final Clinical Impression(s) / ED Diagnoses Final diagnoses:  Type 1 diabetes mellitus with hyperosmolar hyperglycemic state (HHS) ([place])  Community acquired pneumonia of left lung, unspecified part of lung    Rx / DC Orders ED Discharge Orders    None       Aron Baba 08/25/20  2023    Milton Ferguson, MD 08/28/20 [phone]

## 2020-08-25 NOTE — ED Notes (Signed)
Medication delay due to difficulty obtaining vascular access. One iv in place

## 2020-08-25 NOTE — ED Notes (Signed)
CRITICAL VALUE ALERT  Critical Value:  816  Date & Time Notied:  08/25/20 1900  Provider Notified: Roderic Palau  Orders Received/Actions taken:

## 2020-08-26 LAB — BASIC METABOLIC PANEL
Anion gap: 11 (ref 5–15)
Anion gap: 5 (ref 5–15)
BUN: 5 mg/dL — ABNORMAL LOW (ref 6–20)
BUN: 5 mg/dL — ABNORMAL LOW (ref 6–20)
CO2: 26 mmol/L (ref 22–32)
CO2: 28 mmol/L (ref 22–32)
Calcium: 8.6 mg/dL — ABNORMAL LOW (ref 8.9–10.3)
Calcium: 8.3 mg/dL — ABNORMAL LOW (ref 8.9–10.3)
Chloride: 98 mmol/L (ref 98–111)
Chloride: 102 mmol/L (ref 98–111)
Creatinine, Ser: 0.77 mg/dL (ref 0.44–1.00)
Creatinine, Ser: 0.64 mg/dL (ref 0.44–1.00)
GFR calc Af Amer: >60 mL/min (ref >60)
GFR calc Af Amer: >60 mL/min (ref >60)
GFR calc non Af Amer: >60 mL/min (ref >60)
GFR calc non Af Amer: >60 mL/min (ref >60)
Glucose, Bld: 235 mg/dL — ABNORMAL HIGH (ref 70–99)
Glucose, Bld: 156 mg/dL — ABNORMAL HIGH (ref 70–99)
Potassium: 3.4 mmol/L — ABNORMAL LOW (ref 3.5–5.1)
Potassium: 3.6 mmol/L (ref 3.5–5.1)
Sodium: 135 mmol/L (ref 135–145)
Sodium: 135 mmol/L (ref 135–145)

## 2020-08-26 LAB — CBG MONITORING, ED
Glucose-Capillary: 148 mg/dL — ABNORMAL HIGH (ref 70–99)
Glucose-Capillary: 150 mg/dL — ABNORMAL HIGH (ref 70–99)
Glucose-Capillary: 172 mg/dL — ABNORMAL HIGH (ref 70–99)
Glucose-Capillary: 175 mg/dL — ABNORMAL HIGH (ref 70–99)
Glucose-Capillary: 179 mg/dL — ABNORMAL HIGH (ref 70–99)
Glucose-Capillary: 188 mg/dL — ABNORMAL HIGH (ref 70–99)
Glucose-Capillary: 227 mg/dL — ABNORMAL HIGH (ref 70–99)
Glucose-Capillary: 232 mg/dL — ABNORMAL HIGH (ref 70–99)

## 2020-08-26 LAB — PROTEIN / CREATININE RATIO, URINE
Creatinine, Urine: 11.76 mg/dL
Protein Creatinine Ratio: 2.72 mg/mg{Cre} — ABNORMAL HIGH (ref 0.00–0.15)
Total Protein, Urine: 32 mg/dL

## 2020-08-26 LAB — HIV ANTIBODY (ROUTINE TESTING W REFLEX): HIV Screen 4th Generation wRfx: NONREACTIVE

## 2020-08-26 LAB — OSMOLALITY: Osmolality: 316 mOsm/kg — ABNORMAL HIGH (ref 275–295)

## 2020-08-26 MED ORDER — ONDANSETRON HCL 4 MG/2ML IJ SOLN
4.0000 mg | Freq: Once | INTRAMUSCULAR | Status: AC
  Administered 2020-08-26: 4 mg via INTRAVENOUS
  Filled 2020-08-26: qty 2

## 2020-08-26 MED ORDER — LYUMJEV KWIKPEN 100 UNIT/ML ~~LOC~~ SOPN: 5.0000 [IU] | PEN_INJECTOR | Freq: Three times a day (TID) | SUBCUTANEOUS | 3 refills | Status: DC

## 2020-08-26 MED ORDER — FUROSEMIDE 40 MG PO TABS
20.0000 mg | ORAL_TABLET | Freq: Every day | ORAL | Status: AC
  Administered 2020-08-26: 20 mg via ORAL
  Filled 2020-08-26: qty 1

## 2020-08-26 MED ORDER — INSULIN GLARGINE 100 UNIT/ML ~~LOC~~ SOLN: 24.0000 [IU] | Freq: Every day | SUBCUTANEOUS | 11 refills | Status: DC

## 2020-08-26 MED ORDER — FENTANYL CITRATE (PF) 100 MCG/2ML IJ SOLN
50.0000 ug | Freq: Once | INTRAMUSCULAR | Status: AC
  Administered 2020-08-26: 50 ug via INTRAVENOUS
  Filled 2020-08-26: qty 2

## 2020-08-26 MED ORDER — TRAMADOL HCL 50 MG PO TABS
50.0000 mg | ORAL_TABLET | Freq: Once | ORAL | Status: AC
  Administered 2020-08-26: 50 mg via ORAL
  Filled 2020-08-26: qty 1

## 2020-08-26 MED ORDER — LEVOFLOXACIN 500 MG PO TABS: 500.0000 mg | ORAL_TABLET | Freq: Every day | ORAL | 0 refills | Status: AC

## 2020-08-26 MED ORDER — FUROSEMIDE 20 MG PO TABS: 20.0000 mg | ORAL_TABLET | Freq: Every day | ORAL | 1 refills | Status: DC

## 2020-08-26 MED ORDER — INSULIN DEGLUDEC 100 UNIT/ML ~~LOC~~ SOPN: 24.0000 [IU] | PEN_INJECTOR | Freq: Every day | SUBCUTANEOUS | Status: DC

## 2020-08-26 MED ORDER — LISINOPRIL 10 MG PO TABS
10.0000 mg | ORAL_TABLET | Freq: Every day | ORAL | Status: AC
  Administered 2020-08-26: 10 mg via ORAL
  Filled 2020-08-26: qty 1

## 2020-08-26 MED ORDER — INSULIN GLARGINE 100 UNIT/ML ~~LOC~~ SOLN
24.0000 [IU] | Freq: Every day | SUBCUTANEOUS | Status: DC
  Administered 2020-08-26: 24 [IU] via SUBCUTANEOUS
  Filled 2020-08-26 (×4): qty 0.24

## 2020-08-26 MED ORDER — INSULIN GLARGINE 100 UNIT/ML ~~LOC~~ SOLN: 30.0000 [IU] | Freq: Every day | SUBCUTANEOUS | 11 refills | Status: DC

## 2020-08-26 MED ORDER — INSULIN GLARGINE 100 UNIT/ML ~~LOC~~ SOLN
30.0000 [IU] | Freq: Every day | SUBCUTANEOUS | Status: DC
  Filled 2020-08-26 (×3): qty 0.3

## 2020-08-26 MED ORDER — INSULIN ASPART 100 UNIT/ML ~~LOC~~ SOLN
0.0000 [IU] | Freq: Three times a day (TID) | SUBCUTANEOUS | Status: DC
  Administered 2020-08-26: 5 [IU] via SUBCUTANEOUS
  Administered 2020-08-26: 2 [IU] via SUBCUTANEOUS
  Filled 2020-08-26 (×2): qty 1

## 2020-08-26 MED ORDER — LEVOFLOXACIN IN D5W 750 MG/150ML IV SOLN: 750.0000 mg | INTRAVENOUS | Status: DC

## 2020-08-26 NOTE — TOC Transition Note (Signed)
Transition of Care [facility]) - CM/SW Discharge Note   Patient Details  Name: Diamond [name] MRN: [id number] Date of Birth: Jul 12, 1996  [facility] CM/SW Contact:  Natasha Bence, LCSW Phone Number: 08/26/2020, 12:01 PM   Clinical Narrative:    CSW received medication consult for patient. CSW inquired about patient's agreeableness to be referred to [facility] and receive a match voucher. Patient agreeable to referrals. CSW contacted Viviann Spare RN for [employer]. Camilla agreeable to provided match voucher for patient. CSW notified patient that she would be required to pay $3 for each medication with voucher. CSW contacted [facility]. [facility] reported that patient had already been referred to program and had an appointment scheduled on 08/24/2020. CSW scheduled follow up appt for patient on 08/26/2020, provided [facility] info sheet and match voucher to patient. TOC signing off.    Final next level of care: Home/Self Care Barriers to Discharge: Barriers Resolved   Patient Goals and CMS Choice Patient states their goals for this hospitalization and ongoing recovery are:: Return home with [facility] referral and Match voucher for Medication   Choice offered to / list presented to : Patient  Discharge Placement                    Patient and family notified of of transfer: 08/26/20  Discharge Plan and Services                                     Social Determinants of Health (SDOH) Interventions     Readmission Risk Interventions No flowsheet data found.

## 2020-08-26 NOTE — Progress Notes (Signed)
Pharmacy Antibiotic Note  Diamond [name] is a 24 y.o. female admitted on 08/25/2020 with pneumonia.  Pharmacy has been consulted for Levaquin dosing. WBC 10.7. Renal function OK. PCN and cephalosporin allergies.   Plan: Levaquin 750 mg IV q24h Trend WBC, temp, renal function  F/U infectious work-up   Height: 4' 11"  (149.9 cm) Weight: 56.7 kg (125 lb) IBW/kg (Calculated) : 43.2  Temp (24hrs), Avg:99.1 F (37.3 C), Min:98.8 F (37.1 C), Max:99.5 F (37.5 C)  Recent Labs  Lab 08/25/20 1754 08/25/20 2248  WBC 10.7*  --   CREATININE 0.96 0.77    Estimated Creatinine Clearance: 83.2 mL/min (by C-G formula based on SCr of 0.77 mg/dL).    Allergies  Allergen Reactions  . Amoxicillin Hives    Did it involve swelling of the face/tongue/throat, SOB, or low BP? no  Did it involve sudden or severe rash/hives, skin peeling, or any reaction on the inside of your mouth or nose? no Did you need to seek medical attention at a hospital or doctor's office? no When did it last happen?unk If all above answers are "NO", may proceed with cephalosporin use.  . Augmentin [Amoxicillin-Pot Clavulanate] Other (See Comments)    Reaction unknown  . Ceftriaxone Sodium In Dextrose   . Clavulanic Acid   . Adhesive [Tape] Itching and Rash  . Latex Itching and Rash  . Other Itching and Rash  . Pineapple Rash    Rash on tongue and throat    Narda Bonds, PharmD, BCPS Clinical Pharmacist Phone: [phone]

## 2020-08-26 NOTE — Progress Notes (Signed)
TRH night shift.  The patient was transitioned from IV to her regular twice daily SQ long acting insulin.  Carbohydrate modified diet with CBG monitoring before meals and at bedtime was ordered.  She was placed on [place] SS coverage before meals.  Tennis Must, MD

## 2020-08-26 NOTE — Discharge Planning (Signed)
  [place] Medication Assistance Card Name: Diamond [name] (MRN): [id number] [place]: [id number] RX Group: [id number] Discharge Date: 08/26/20 Expiration Date:09/03/20                                           (must be filled within 7 days of discharge)     You have been approved to have the prescriptions written by your discharging physician filled through our [facility] (Medication Assistance Through [facility]) program. This program allows for a one-time (no refills) 34-day supply of selected medications for a low copay amount.  The copay is $3.00 per prescription. For instance, if you have one prescription, you will pay $3.00; for two prescriptions, you pay $6.00; for three prescriptions, you pay $9.00; and so on.  Only certain pharmacies are participating in this program with [facility]. You will need to select one of the pharmacies from the attached list and take your prescriptions, this letter, and your photo ID to one of the participating pharmacies.   We are excited that you are able to use the [facility] program to get your medications. These prescriptions must be filled within 7 days of hospital discharge or they will no longer be valid for the [facility] program. Should you have any problems with your prescriptions please contact your case management team member at [phone] for [place]/ [place] you, [place] Management

## 2020-08-26 NOTE — ED Notes (Signed)
Per Dr. Olevia Bowens, ok to d/c insulin and D5 w/ LR.

## 2020-08-26 NOTE — Discharge Summary (Signed)
Physician Discharge Summary  KEYSI OELKERS IOE:[id number] DOB: 07-13-1996 DOA: 08/25/2020  PCP: Dara Lords, NP  Admit date: 08/25/2020  Discharge date: 08/26/2020  Admitted From:Home  Disposition:  Home  Recommendations for Outpatient Follow-up:  1. Follow up with PCP in 1-2 weeks, patient will need to follow-up with endocrinology as well as nephrology in the near future 2. Please obtain BMP/CBC in one week 3. Continue on insulin as prescribed, now with Lantus twice daily instead of Tresiba as patient has affordability issues, CSW to address prior to discharge 4. Continue on Levaquin for 4 more days for total 5-day coverage for pneumonia 5. Lasix 20 mg daily refilled given loss of medication and no follow-up  Home Health: None  Equipment/Devices: None  Discharge Condition: Stable  CODE STATUS: Full  Diet recommendation: Heart Healthy/carb modified  Brief/Interim Summary: Per HPI: MELESSIA KAUS is a 24 y.o. female with medical history significant for type 1 diabetes mellitus, asthma, Crohn's disease, stroke, depression. Patient presented to the ED today with complaints of chest pain, bilateral chest pains, but worse on the right, with cough productive of grayish sputum.  She reports cough has been ongoing over the past month.  She reports difficulty breathing and chills over the past few days. She tells me she has been compliant with her insulins, she last took her Tyler Aas last night 25 units, and her NovoLin this afternoon sliding scale.  She reports no oral intake in 2 days. Patient also has bilateral lower extremity swelling up to her knees, she also tells me her vulvar is swollen, her arms and face are all swollen.  She reports she is normally quite thin.  She is supposed to be on Lasix 20 mg daily, but she ran out.  Patient reports she has no insurance, she is finding it hard to afford her medications.  She reports multiple episodes of watery stools today.  No  vomiting.  She denies specific abdominal pain but reports generalized body aches.  9/24: Patient was admitted with severe hyperglycemia in the setting of uncontrolled type 1 diabetes in the setting of community-acquired pneumonia.  She did not appear to be in DKA.  She has had multiple recent hospitalizations at [facility] with acute gastroenteritis as well as hyperglycemia and buttock abscesses due to MSSA that required I&D and antibiotics.  She states that these have now healed.  She was noted to have left-sided community-acquired pneumonia and was started on Levaquin for treatment and was also complaining of some chest pain and cough when she first arrived.  This unfortunately has been ongoing over the past month.  She has tried to remain compliant with her home insulin use, but unfortunately has had some difficulty with coverage and affordability of her medications to include her home Lasix.  She has had some lower extremity swelling as a result as well as swelling to her arms and face.  She will have refills prescribed of the medications as noted above and will need close follow-up with specialty services to include nephrology and endocrinology in the near future to ensure blood glucose remained stable as well as follow-up with PCP.  She is otherwise stable for discharge and has had financial issues addressed with CSW.  Discharge Diagnoses:  Principal Problem:   Hyperglycemia Active Problems:   Asthma, persistent controlled   Mood disorder (HCC)   History of Crohn's disease   Bilateral lower extremity edema   Pneumonia  Principal discharge diagnosis: Significant hyperglycemia in the setting of  type 1 diabetes in setting of community-acquired pneumonia.  Discharge Instructions  Discharge Instructions    Diet - low sodium heart healthy   Complete by: As directed    Increase activity slowly   Complete by: As directed      Allergies as of 08/26/2020      Reactions   Amoxicillin Hives    Did it involve swelling of the face/tongue/throat, SOB, or low BP? no  Did it involve sudden or severe rash/hives, skin peeling, or any reaction on the inside of your mouth or nose? no Did you need to seek medical attention at a hospital or doctor's office? no When did it last happen?unk If all above answers are "NO", may proceed with cephalosporin use.   Augmentin [amoxicillin-pot Clavulanate] Other (See Comments)   Reaction unknown   Ceftriaxone Sodium In Dextrose    Clavulanic Acid    Adhesive [tape] Itching, Rash   Latex Itching, Rash   Other Itching, Rash   Pineapple Rash   Rash on tongue and throat      Medication List    STOP taking these medications   Basaglar KwikPen 100 UNIT/ML Replaced by: insulin glargine 100 UNIT/ML injection   hydrochlorothiazide 25 MG tablet Commonly known as: HYDRODIURIL   Tresiba FlexTouch 100 UNIT/ML FlexTouch Pen Generic drug: insulin degludec     TAKE these medications   Accu-Chek Guide w/Device Kit 1 Piece by Does not apply route as directed.   acetaminophen 500 MG tablet Commonly known as: TYLENOL Take 500 mg by mouth every 6 (six) hours as needed for mild pain.   albuterol 108 (90 Base) MCG/ACT inhaler Commonly known as: VENTOLIN HFA Inhale 2 puffs into the lungs every 6 (six) hours as needed. For shortness of breath   B-D ULTRAFINE III SHORT PEN 31G X 8 MM Misc Generic drug: Insulin Pen Needle USE 4 TIMES A DAY   furosemide 20 MG tablet Commonly known as: LASIX Take 1 tablet (20 mg total) by mouth daily.   gabapentin 100 MG capsule Commonly known as: NEURONTIN Take 1 capsule by mouth 3 (three) times daily.   glucagon 1 MG injection Inject 1 mg into the vein once as needed.   glucose blood test strip Commonly known as: Accu-Chek Guide Use as instructed 4 x daily. E11.65   ibuprofen 100 MG tablet Commonly known as: ADVIL Take 100 mg by mouth every 6 (six) hours as needed for pain or fever.   insulin glargine  100 UNIT/ML injection Commonly known as: LANTUS Inject 0.3 mLs (30 Units total) into the skin at bedtime. Replaces: Basaglar KwikPen 100 UNIT/ML   insulin glargine 100 UNIT/ML injection Commonly known as: LANTUS Inject 0.24 mLs (24 Units total) into the skin daily.   levofloxacin 500 MG tablet Commonly known as: Levaquin Take 1 tablet (500 mg total) by mouth daily for 4 days.   lisinopril 10 MG tablet Commonly known as: ZESTRIL Take 1 tablet by mouth daily.   Lyumjev KwikPen 100 UNIT/ML Sopn Generic drug: Insulin Lispro-aabc (1 U Dial) Inject 5-8 Units into the skin 3 (three) times daily before meals.   venlafaxine XR 37.5 MG 24 hr capsule Commonly known as: EFFEXOR-XR Take 37.5 mg by mouth at bedtime.       Follow-up Information    Dekoninck, Beth A, NP Follow up in 1 week(s).   Specialty: General Practice Contact information: [address], [place] [address] [phone]  Allergies  Allergen Reactions  . Amoxicillin Hives    Did it involve swelling of the face/tongue/throat, SOB, or low BP? no  Did it involve sudden or severe rash/hives, skin peeling, or any reaction on the inside of your mouth or nose? no Did you need to seek medical attention at a hospital or doctor's office? no When did it last happen?unk If all above answers are "NO", may proceed with cephalosporin use.  . Augmentin [Amoxicillin-Pot Clavulanate] Other (See Comments)    Reaction unknown  . Ceftriaxone Sodium In Dextrose   . Clavulanic Acid   . Adhesive [Tape] Itching and Rash  . Latex Itching and Rash  . Other Itching and Rash  . Pineapple Rash    Rash on tongue and throat    Consultations:  None   Procedures/Studies: DG Chest 2 View  Result Date: 08/25/2020 CLINICAL DATA:  Chest pain with productive cough. EXAM: CHEST - 2 VIEW COMPARISON:  Radiograph 04/30/2020.  CT 06/25/2020 FINDINGS: Patchy opacity in the left suprahilar and infrahilar lung  suspicious for pneumonia. The heart is normal in size with normal mediastinal contours. No pleural effusion or pneumothorax. No pulmonary edema. No acute osseous abnormalities are seen. IMPRESSION: Patchy left suprahilar and infrahilar lung opacity suspicious for pneumonia. Electronically Signed   By: Keith Rake M.D.   On: 08/25/2020 18:23     Discharge Exam: Vitals:   08/26/20 0730 08/26/20 0745  BP: (!) 127/92   Pulse: (!) 109 (!) 112  Resp: 10 13  Temp:    SpO2: 95% 95%   Vitals:   08/26/20 0700 08/26/20 0715 08/26/20 0730 08/26/20 0745  BP: (!) 124/96  (!) 127/92   Pulse: (!) 113  (!) 109 (!) 112  Resp: 18 12 10 13   Temp:      TempSrc:      SpO2: 98%  95% 95%  Weight:      Height:        General: Pt is alert, awake, not in acute distress Cardiovascular: RRR, S1/S2 +, no rubs, no gallops Respiratory: CTA bilaterally, no wheezing, no rhonchi Abdominal: Soft, NT, ND, bowel sounds + Extremities: no edema, no cyanosis    The results of significant diagnostics from this hospitalization (including imaging, microbiology, ancillary and laboratory) are listed below for reference.     Microbiology: Recent Results (from the past 240 hour(s))  Blood culture (routine x 2)     Status: None (Preliminary result)   Collection Time: 08/25/20  7:41 PM   Specimen: Left Antecubital; Blood  Result Value Ref Range Status   Specimen Description LEFT ANTECUBITAL  Final   Special Requests   Final    BOTTLES DRAWN AEROBIC AND ANAEROBIC Blood Culture adequate volume   Culture   Final    NO GROWTH < 12 HOURS Performed at [facility], [address], [place], [place] [address]    Report Status PENDING  Incomplete  Respiratory Panel by RT PCR (Flu A&B, Covid) - Nasopharyngeal Swab     Status: None   Collection Time: 08/25/20  8:20 PM   Specimen: Nasopharyngeal Swab  Result Value Ref Range Status   SARS Coronavirus 2 by RT PCR NEGATIVE NEGATIVE Final    Comment: (NOTE) SARS-CoV-2  target nucleic acids are NOT DETECTED.  The SARS-CoV-2 RNA is generally detectable in upper respiratoy specimens during the acute phase of infection. The lowest concentration of SARS-CoV-2 viral copies this assay can detect is 131 copies/mL. A negative result does not preclude SARS-Cov-2 infection  and should not be used as the sole basis for treatment or other patient management decisions. A negative result may occur with  improper specimen collection/handling, submission of specimen other than nasopharyngeal swab, presence of viral mutation(s) within the areas targeted by this assay, and inadequate number of viral copies (<131 copies/mL). A negative result must be combined with clinical observations, patient history, and epidemiological information. The expected result is Negative.  Fact Sheet for Patients:  PinkCheek.be  Fact Sheet for Healthcare Providers:  GravelBags.it  This test is no t yet approved or cleared by the [place] FDA and  has been authorized for detection and/or diagnosis of SARS-CoV-2 by FDA under an Emergency Use Authorization (EUA). This EUA will remain  in effect (meaning this test can be used) for the duration of the COVID-19 declaration under Section 564(b)(1) of the Act, 21 U.S.C. section 360bbb-3(b)(1), unless the authorization is terminated or revoked sooner.     Influenza A by PCR NEGATIVE NEGATIVE Final   Influenza B by PCR NEGATIVE NEGATIVE Final    Comment: (NOTE) The Xpert Xpress SARS-CoV-2/FLU/RSV assay is intended as an aid in  the diagnosis of influenza from Nasopharyngeal swab specimens and  should not be used as a sole basis for treatment. Nasal washings and  aspirates are unacceptable for Xpert Xpress SARS-CoV-2/FLU/RSV  testing.  Fact Sheet for Patients: PinkCheek.be  Fact Sheet for Healthcare  Providers: GravelBags.it  This test is not yet approved or cleared by the [place] FDA and  has been authorized for detection and/or diagnosis of SARS-CoV-2 by  FDA under an Emergency Use Authorization (EUA). This EUA will remain  in effect (meaning this test can be used) for the duration of the  Covid-19 declaration under Section 564(b)(1) of the Act, 21  U.S.C. section 360bbb-3(b)(1), unless the authorization is  terminated or revoked. Performed at [facility], [address] [place], [place] [address]   Blood culture (routine x 2)     Status: None (Preliminary result)   Collection Time: 08/25/20  8:36 PM   Specimen: BLOOD RIGHT ARM  Result Value Ref Range Status   Specimen Description BLOOD RIGHT ARM  Final   Special Requests   Final    BOTTLES DRAWN AEROBIC AND ANAEROBIC Blood Culture adequate volume   Culture   Final    NO GROWTH < 12 HOURS Performed at [facility], [address] [place], [place] [address]    Report Status PENDING  Incomplete     Labs: BNP (last 3 results) Recent Labs    04/30/20 1739  BNP 30.1   Basic Metabolic Panel: Recent Labs  Lab 08/25/20 1754 08/25/20 2248 08/26/20 0507  NA 127* 135 135  K 3.9 3.4* 3.6  CL 92* 98 102  CO2 24 26 28   GLUCOSE 816* 235* 156*  BUN 7 5* 5*  CREATININE 0.96 0.77 0.64  CALCIUM 8.8* 8.6* 8.3*   Liver Function Tests: Recent Labs  Lab 08/25/20 1941  AST 15  ALT 17  ALKPHOS 121  BILITOT 0.9  PROT 7.1  ALBUMIN 2.5*   Recent Labs  Lab 08/25/20 1754  LIPASE 22   No results for input(s): AMMONIA in the last 168 hours. CBC: Recent Labs  Lab 08/25/20 1754  WBC 10.7*  HGB 12.9  HCT 40.4  MCV 90.2  PLT 356   Cardiac Enzymes: No results for input(s): CKTOTAL, CKMB, CKMBINDEX, TROPONINI in the last 168 hours. BNP: Invalid input(s): POCBNP CBG: Recent Labs  Lab 08/26/20 0146 08/26/20  7169 08/26/20 0346 08/26/20 0441 08/26/20 0801  GLUCAP 172* 150* 175*  148* 232*   D-Dimer No results for input(s): DDIMER in the last 72 hours. Hgb A1c No results for input(s): HGBA1C in the last 72 hours. Lipid Profile No results for input(s): CHOL, HDL, LDLCALC, TRIG, CHOLHDL, LDLDIRECT in the last 72 hours. Thyroid function studies Recent Labs    08/25/20 1754  TSH 1.078   Anemia work up No results for input(s): VITAMINB12, FOLATE, FERRITIN, TIBC, IRON, RETICCTPCT in the last 72 hours. Urinalysis    Component Value Date/Time   COLORURINE COLORLESS (A) 08/25/2020 2020   APPEARANCEUR CLEAR 08/25/2020 2020   LABSPEC 1.021 08/25/2020 2020   PHURINE 6.0 08/25/2020 2020   GLUCOSEU >=500 (A) 08/25/2020 2020   HGBUR SMALL (A) 08/25/2020 2020   BILIRUBINUR NEGATIVE 08/25/2020 2020   KETONESUR 5 (A) 08/25/2020 2020   PROTEINUR 30 (A) 08/25/2020 2020   UROBILINOGEN 0.2 09/13/2012 2014   NITRITE NEGATIVE 08/25/2020 2020   LEUKOCYTESUR NEGATIVE 08/25/2020 2020   Sepsis Labs Invalid input(s): PROCALCITONIN,  WBC,  LACTICIDVEN Microbiology Recent Results (from the past 240 hour(s))  Blood culture (routine x 2)     Status: None (Preliminary result)   Collection Time: 08/25/20  7:41 PM   Specimen: Left Antecubital; Blood  Result Value Ref Range Status   Specimen Description LEFT ANTECUBITAL  Final   Special Requests   Final    BOTTLES DRAWN AEROBIC AND ANAEROBIC Blood Culture adequate volume   Culture   Final    NO GROWTH < 12 HOURS Performed at [facility], [address], [place], [place] [address]    Report Status PENDING  Incomplete  Respiratory Panel by RT PCR (Flu A&B, Covid) - Nasopharyngeal Swab     Status: None   Collection Time: 08/25/20  8:20 PM   Specimen: Nasopharyngeal Swab  Result Value Ref Range Status   SARS Coronavirus 2 by RT PCR NEGATIVE NEGATIVE Final    Comment: (NOTE) SARS-CoV-2 target nucleic acids are NOT DETECTED.  The SARS-CoV-2 RNA is generally detectable in upper respiratoy specimens during the acute phase of  infection. The lowest concentration of SARS-CoV-2 viral copies this assay can detect is 131 copies/mL. A negative result does not preclude SARS-Cov-2 infection and should not be used as the sole basis for treatment or other patient management decisions. A negative result may occur with  improper specimen collection/handling, submission of specimen other than nasopharyngeal swab, presence of viral mutation(s) within the areas targeted by this assay, and inadequate number of viral copies (<131 copies/mL). A negative result must be combined with clinical observations, patient history, and epidemiological information. The expected result is Negative.  Fact Sheet for Patients:  PinkCheek.be  Fact Sheet for Healthcare Providers:  GravelBags.it  This test is no t yet approved or cleared by the [place] FDA and  has been authorized for detection and/or diagnosis of SARS-CoV-2 by FDA under an Emergency Use Authorization (EUA). This EUA will remain  in effect (meaning this test can be used) for the duration of the COVID-19 declaration under Section 564(b)(1) of the Act, 21 U.S.C. section 360bbb-3(b)(1), unless the authorization is terminated or revoked sooner.     Influenza A by PCR NEGATIVE NEGATIVE Final   Influenza B by PCR NEGATIVE NEGATIVE Final    Comment: (NOTE) The Xpert Xpress SARS-CoV-2/FLU/RSV assay is intended as an aid in  the diagnosis of influenza from Nasopharyngeal swab specimens and  should not be used as a sole basis  for treatment. Nasal washings and  aspirates are unacceptable for Xpert Xpress SARS-CoV-2/FLU/RSV  testing.  Fact Sheet for Patients: PinkCheek.be  Fact Sheet for Healthcare Providers: GravelBags.it  This test is not yet approved or cleared by the [place] FDA and  has been authorized for detection and/or diagnosis of SARS-CoV-2  by  FDA under an Emergency Use Authorization (EUA). This EUA will remain  in effect (meaning this test can be used) for the duration of the  Covid-19 declaration under Section 564(b)(1) of the Act, 21  U.S.C. section 360bbb-3(b)(1), unless the authorization is  terminated or revoked. Performed at [facility], [address], [place], [place] [address]   Blood culture (routine x 2)     Status: None (Preliminary result)   Collection Time: 08/25/20  8:36 PM   Specimen: BLOOD RIGHT ARM  Result Value Ref Range Status   Specimen Description BLOOD RIGHT ARM  Final   Special Requests   Final    BOTTLES DRAWN AEROBIC AND ANAEROBIC Blood Culture adequate volume   Culture   Final    NO GROWTH < 12 HOURS Performed at [facility], [address] [place], [place] [address]    Report Status PENDING  Incomplete     Time coordinating discharge: 35 minutes  SIGNED:   Rodena Goldmann, DO [facility] 08/26/2020, 10:27 AM  If 7PM-7AM, please contact night-coverage [email or web address]

## 2020-08-29 LAB — HEMOGLOBIN A1C
Hgb A1c MFr Bld: >15.5 % — ABNORMAL HIGH (ref 4.8–5.6)
Mean Plasma Glucose: >398 mg/dL

## 2020-08-31 LAB — CULTURE, BLOOD (ROUTINE X 2)
Culture: NO GROWTH
Culture: NO GROWTH
Special Requests: ADEQUATE
Special Requests: ADEQUATE

## 2020-09-15 ENCOUNTER — Other Ambulatory Visit (HOSPITAL_COMMUNITY): Payer: Self-pay | Admitting: *Deleted

## 2020-09-15 ENCOUNTER — Ambulatory Visit (HOSPITAL_COMMUNITY)
Admission: RE | Admit: 2020-09-15 | Discharge: 2020-09-15 | Disposition: A | Discharge status: Home / Self Care | Payer: Medicaid Other | Source: Ambulatory Visit | Attending: *Deleted | Admitting: *Deleted

## 2020-09-15 ENCOUNTER — Other Ambulatory Visit: Payer: Self-pay

## 2020-09-15 DIAGNOSIS — R051 Acute cough: Secondary | ICD-10-CM

## 2020-09-27 ENCOUNTER — Encounter: Payer: Self-pay | Admitting: Physician Assistant

## 2020-09-27 ENCOUNTER — Ambulatory Visit: Payer: Medicaid Other | Admitting: Physician Assistant

## 2020-09-27 VITALS — BP 116/90 | HR 110 | Temp 98.1°F | Ht 59.25 in | Wt 112.0 lb

## 2020-09-27 DIAGNOSIS — N320 Bladder-neck obstruction: Secondary | ICD-10-CM

## 2020-09-27 DIAGNOSIS — Z7689 Persons encountering health services in other specified circumstances: Secondary | ICD-10-CM

## 2020-09-27 DIAGNOSIS — Z960 Presence of urogenital implants: Secondary | ICD-10-CM

## 2020-09-27 DIAGNOSIS — E1065 Type 1 diabetes mellitus with hyperglycemia: Secondary | ICD-10-CM

## 2020-09-27 NOTE — Progress Notes (Signed)
BP 116/90   Pulse (!) 110   Temp 98.1 F (36.7 C)   Ht 4' 11.25" (1.505 m)   Wt 112 lb (50.8 kg)   SpO2 97%   BMI 22.43 kg/m    Subjective:    Patient ID: Diamond [name], female    DOB: [birth year], 24 y.o.   MRN: [id number]  HPI: Diamond [name] is a 24 y.o. female presenting on 09/27/2020 for New Patient (Initial Visit) (previous pt at [facility]. pt is currently out of most medications and her bs meter is not working.), Amenorrhea (no mensus since April 2021), Abdominal Pain (upper abd pain daily. pain is worse when she eats food), and Numbness (in bilateral hands R worse than L. and Bilateral legs)   HPI    Pt had a negative covid 19 screening questionnaire.    Pt is a 42yoF who presents to establish care.    She was Diagnosed with DM at age 67  Her Most recent PCP was [facility].   She says they told her to come here because she had too many health issues.    She says she has never had a PAP smear.   Epic lists pt as being on injection as to having no menses but pt says she doesn't.    Pt says she had a stroke in she doesn't remember what month but sometime this year- she says she was treated at [employer].    She works at [employer] in [employer]   Per Epic: DM type 1 Asthma Crohns GERD Bladder outlet obstruction Diabetic Retinopathy   Her Most recent covid test 09/12/20- full resp panel all negative (at [facility])  a1c on 08/25/20-  > 15.5 ([facility])  Labs done at [facility] on 09/18/20 reviewed (cmp, cbc)  She says she checks her bs with her aunt's meter.  This morning it was high.  Too high to given a number.    She has ST and her voice keeps going and her upper abd hurts today.  Also cough which has been going since she had CAP.    She Has seen endocrinologist-  Last one in March 2021- [name]  She says she has Crohns but doesn't remember last time she saw anyone for that.  She doesn't know where she went for her eyes.  She says she went a lot of places.  She sees  urologist for her foley- only seen in the [facility]opital.   She says she doesn't have any bags- it was changed last on 10/20 at [facility].    She says she has CAFA that she is working on and is suppsed to mail it back to [employer].    Pt reports recent pancreatitis.    Relevant past medical, surgical, family and social history reviewed and updated as indicated. Interim medical history since our last visit reviewed. Allergies and medications reviewed and updated.  CURRENT  basaglar bid (breakfast and dinner) 25 units bid novolin R ssi Metoclopramide Albuterol - uses every day     Review of Systems  Per HPI unless specifically indicated above     Objective:    BP 116/90   Pulse (!) 110   Temp 98.1 F (36.7 C)   Ht 4' 11.25" (1.505 m)   Wt 112 lb (50.8 kg)   SpO2 97%   BMI 22.43 kg/m   Wt Readings from Last 3 Encounters:  09/27/20 112 lb (50.8 kg)  08/25/20 125 lb (56.7 kg)  04/30/20  125 lb (56.7 kg)    Physical Exam Vitals reviewed.  Constitutional:      General: She is not in acute distress.    Appearance: She is well-developed.  HENT:     Head: Normocephalic and atraumatic.  Eyes:     Conjunctiva/sclera: Conjunctivae normal.     Pupils: Pupils are equal, round, and reactive to light.  Neck:     Thyroid: No thyromegaly.  Cardiovascular:     Rate and Rhythm: Normal rate and regular rhythm.  Pulmonary:     Effort: Pulmonary effort is normal.     Breath sounds: Normal breath sounds.  Abdominal:     General: Bowel sounds are normal.     Palpations: Abdomen is soft. There is no mass.     Tenderness: There is no abdominal tenderness.  Musculoskeletal:     Cervical back: Neck supple.     Right lower leg: No edema.     Left lower leg: No edema.  Lymphadenopathy:     Cervical: No cervical adenopathy.  Skin:    General: Skin is warm and dry.  Neurological:     Mental Status: She is oriented to person, place, and time.     Gait: Gait normal.  Psychiatric:         Attention and Perception: She is inattentive.        Mood and Affect: Affect is flat.        Behavior: Behavior is withdrawn.     Comments: Pt very inattentive to conversation and begins to doze off several times during conversation and had to be awakened and asked to participate in her care.  She seems entirely uninterested in participating in her care and discussing her many and significant medical conditions.              Assessment & Plan:    Encounter Diagnoses  Name Primary?  . Encounter to establish care Yes  . Uncontrolled type 1 diabetes mellitus with hyperglycemia ([place])   . Indwelling urinary catheter present   . Bladder outlet obstruction       -pt encouraged to get CAFA/[employer] financial assistance application submitted -pt will need Referrals- to endocrinology most assuredly and urology and perhaps more in the near future -Discussed with pt that she needs to have no no-shows to specialists.  Discussed that she needs to be an active participant in her medical care -will Increase basaglar to 35u bid and continue novolin R ssi.  Will likely have pt change over to lantus and apidra but she says she has plenty insulin at present -pt counseled to Monitor bs and contact office for fbs < 70 or > 300 -Pt is given bs meter for free today at no cost to her so she can monitor her bs.  She will need to purchase any more strips than what is included with the meter -pt to follow up with bs log and meter in 2 weeks.  She is to contact office sooner prn

## 2020-10-03 ENCOUNTER — Telehealth: Payer: Self-pay | Admitting: Physician Assistant

## 2020-10-03 NOTE — Telephone Encounter (Signed)
Pt called stating a problem with her insulin.  She was offered appointment for tomorrow which was declined.  She was offered appointment for Wednesday 10/05/20 which was declined.  Due to extreme difficulty with hearing the pt whose voice is very soft and the TV in the background was on loud, it was requested that she turn off the TV and she hung up instead of turning down the TV volume.

## 2020-10-11 ENCOUNTER — Encounter: Payer: Self-pay | Admitting: Physician Assistant

## 2020-10-11 ENCOUNTER — Ambulatory Visit: Payer: Medicaid Other | Admitting: Physician Assistant

## 2020-10-11 VITALS — BP 122/90 | HR 110 | Temp 99.5°F | Ht 59.25 in | Wt 123.7 lb

## 2020-10-11 DIAGNOSIS — R197 Diarrhea, unspecified: Secondary | ICD-10-CM

## 2020-10-11 DIAGNOSIS — R609 Edema, unspecified: Secondary | ICD-10-CM

## 2020-10-11 DIAGNOSIS — E1065 Type 1 diabetes mellitus with hyperglycemia: Secondary | ICD-10-CM

## 2020-10-11 DIAGNOSIS — K50919 Crohn's disease, unspecified, with unspecified complications: Secondary | ICD-10-CM

## 2020-10-11 MED ORDER — FUROSEMIDE 20 MG PO TABS
20.0000 mg | ORAL_TABLET | Freq: Every day | ORAL | 0 refills | Status: DC
Start: 2020-10-11 — End: 2020-10-12

## 2020-10-11 MED ORDER — LISINOPRIL 10 MG PO TABS
10.0000 mg | ORAL_TABLET | Freq: Every day | ORAL | 0 refills | Status: DC
Start: 2020-10-11 — End: 2020-10-21

## 2020-10-11 NOTE — Progress Notes (Signed)
BP 122/90   Pulse (!) 110   Temp 99.5 F (37.5 C)   Ht 4' 11.25" (1.505 m)   Wt 123 lb 11.2 oz (56.1 kg)   SpO2 96%   BMI 24.77 kg/m    Subjective:    Patient ID: Diamond [name], female    DOB: 1996-01-21, 24 y.o.   MRN: [id number]  HPI: Diamond [name] is a 24 y.o. female presenting on 10/11/2020 for Diabetes   HPI   Pt had a negative covid 19 screening questionnaire..   Pt is 24yoF with uncontrolled type 1 diabetes who presents to her second appointment with [facility].   She has appt with urologist in December for bladder outlet obstruction and other urinary issues  Pt was referred to endocrinology and they have tried to call her to schedule but have been unable to contact her.   She says she has basaglar, lots of it.  She doesn't have much short acting insulin.    She hasn't used her inhaler recently.   cmp done 10/06/20 at [facility] reviewed.  Cr 1.17, glucose 670, albumin 2.4    She says she submitted CAFA/application for [employer] financial assistance.  She says she is having a lot of swelling of her feet.  She is also having diarrhea and some abdominal pain.  She says she has Crohns disease but that she hasn't seen a gastroenterologist since before she turned 18.    Pt has her bs log with her today.      Relevant past medical, surgical, family and social history reviewed and updated as indicated. Interim medical history since our last visit reviewed. Allergies and medications reviewed and updated.   Current Outpatient Medications:  .  albuterol (PROVENTIL HFA;VENTOLIN HFA) 108 (90 BASE) MCG/ACT inhaler, Inhale 2 puffs into the lungs every 6 (six) hours as needed. For shortness of breath, Disp: , Rfl:  .  citalopram (CELEXA) 10 MG tablet, Take 10 mg by mouth daily., Disp: , Rfl:  .  furosemide (LASIX) 20 MG tablet, Take 1 tablet (20 mg total) by mouth daily., Disp: 30 tablet, Rfl: 1 .  Insulin Glargine (BASAGLAR KWIKPEN [place]), Inject 35 Units into the skin in  the morning and at bedtime. , Disp: , Rfl:  .  insulin lispro (HUMALOG) 100 UNIT/ML injection, Inject 5-20 Units into the skin 3 (three) times daily before meals., Disp: , Rfl:  .  lisinopril (ZESTRIL) 10 MG tablet, Take 10 mg by mouth daily., Disp: , Rfl:  .  traMADol (ULTRAM) 50 MG tablet, Take 50 mg by mouth at bedtime., Disp: , Rfl:  .  traZODone (DESYREL) 50 MG tablet, Take 50 mg by mouth at bedtime., Disp: , Rfl:     Review of Systems  Per HPI unless specifically indicated above     Objective:    BP 122/90   Pulse (!) 110   Temp 99.5 F (37.5 C)   Ht 4' 11.25" (1.505 m)   Wt 123 lb 11.2 oz (56.1 kg)   SpO2 96%   BMI 24.77 kg/m   Wt Readings from Last 3 Encounters:  10/11/20 123 lb 11.2 oz (56.1 kg)  09/27/20 112 lb (50.8 kg)  08/25/20 125 lb (56.7 kg)    Physical Exam Vitals reviewed.  Constitutional:      General: She is not in acute distress.    Appearance: She is well-developed.  HENT:     Head: Normocephalic and atraumatic.  Cardiovascular:     Rate and  Rhythm: Normal rate and regular rhythm.  Pulmonary:     Effort: Pulmonary effort is normal.     Breath sounds: Normal breath sounds.  Abdominal:     General: Bowel sounds are normal.     Palpations: Abdomen is soft. There is no mass.     Tenderness: There is no abdominal tenderness.  Musculoskeletal:     Cervical back: Neck supple.     Right lower leg: Edema present.     Left lower leg: Edema present.  Lymphadenopathy:     Cervical: No cervical adenopathy.  Skin:    General: Skin is warm and dry.  Neurological:     Mental Status: She is alert and oriented to person, place, and time.     Comments: Pt is much more alert today and interacts appropriately.  She engages with conversation about her medical conditions and responds appropriately.  Psychiatric:        Attention and Perception: Attention normal.        Speech: Speech normal.        Behavior: Behavior normal. Behavior is cooperative.             Assessment & Plan:    Encounter Diagnoses  Name Primary?  Marland Kitchen Uncontrolled type 1 diabetes mellitus with hyperglycemia ([place]) Yes  . Crohn's disease with complication, unspecified gastrointestinal tract location ([place])   . Diarrhea, unspecified type   . Edema, unspecified type      -appt with endocrinology made for tomorrow.  Pt counseled to take bs log and meter with her to her appointment -discussed with pt that she no longer needs to purchase her insulin.  We have samples and will order some after she sees endocrinology (to see what they want her to use) -Will order lisinopril and lasix from [facility].   -Mental Health meds will need to be sent by [facility].  She is encouraged to ask them to send rx to [place] -Refer to GI for crohns and diarrhea  -she has urology appointment scheduled -pt is scheduled to follow up here in 6 weeks.  She is encouraged to contact office sooner if needed

## 2020-10-12 ENCOUNTER — Encounter: Payer: Self-pay | Admitting: "Endocrinology

## 2020-10-12 ENCOUNTER — Other Ambulatory Visit: Payer: Self-pay

## 2020-10-12 ENCOUNTER — Ambulatory Visit (INDEPENDENT_AMBULATORY_CARE_PROVIDER_SITE_OTHER): Payer: Self-pay | Admitting: "Endocrinology

## 2020-10-12 VITALS — BP 124/76 | HR 109 | Ht 59.25 in | Wt 123.2 lb

## 2020-10-12 DIAGNOSIS — E1065 Type 1 diabetes mellitus with hyperglycemia: Secondary | ICD-10-CM

## 2020-10-12 DIAGNOSIS — Z91199 Patient's noncompliance with other medical treatment and regimen due to unspecified reason: Secondary | ICD-10-CM

## 2020-10-12 DIAGNOSIS — Z9119 Patient's noncompliance with other medical treatment and regimen: Secondary | ICD-10-CM

## 2020-10-12 MED ORDER — FUROSEMIDE 20 MG PO TABS
40.0000 mg | ORAL_TABLET | Freq: Every day | ORAL | 1 refills | Status: DC
Start: 2020-10-12 — End: 2020-10-21

## 2020-10-12 NOTE — Progress Notes (Signed)
10/12/2020, 4:00 PM  Endocrinology follow-up note   Subjective:    Patient ID: Diamond [name], female    DOB: [birth year].  Diamond [name] is being seen in follow-up after she was seen in consultation for management of currently uncontrolled symptomatic type I diabetes. She missed her appointment since March 2021.  She was previously seen in 2018 and disappeared for 3 years from clinic.  Past Medical History:  Diagnosis Date  . ADHD (attention deficit hyperactivity disorder) 2007  . Allergy   . Asthma   . Bipolar disorder ([place])   . Crohn's colitis ([place])    Dx Dr. Cherrie Gauze  . Depression    early childhood, trial of seroquel?  . Diabetic neuropathy ([place])   . GERD (gastroesophageal reflux disease)   . Hypertension   . Pancreatitis   . Panic attacks   . PTSD (post-traumatic stress disorder)   . Stroke ([place]) 07/2020   R side  . Type 1 diabetes ([place]) 2008    Past Surgical History:  Procedure Laterality Date  . ADENOIDECTOMY    . HERNIA REPAIR    . TONSILLECTOMY      Social History   Socioeconomic History  . Marital status: Single    Spouse name: Not on file  . Number of children: Not on file  . Years of education: Not on file  . Highest education level: Not on file  Occupational History  . Not on file  Tobacco Use  . Smoking status: Former Smoker    Types: Cigarettes    Quit date: 12/03/2008    Years since quitting: 11.8  . Smokeless tobacco: Never Used  Vaping Use  . Vaping Use: Never used  Substance and Sexual Activity  . Alcohol use: No  . Drug use: No  . Sexual activity: Yes  Other Topics Concern  . Not on file  Social History Narrative  . Not on file   Social Determinants of Health   Financial Resource Strain:   . Difficulty of Paying Living Expenses: Not on file  Food Insecurity:   . Worried About Charity fundraiser in the Last Year: Not on file   . Ran Out of Food in the Last Year: Not on file  Transportation Needs:   . Lack of Transportation (Medical): Not on file  . Lack of Transportation (Non-Medical): Not on file  Physical Activity:   . Days of Exercise per Week: Not on file  . Minutes of Exercise per Session: Not on file  Stress:   . Feeling of Stress : Not on file  Social Connections:   . Frequency of Communication with Friends and Family: Not on file  . Frequency of Social Gatherings with Friends and Family: Not on file  . Attends Religious Services: Not on file  . Active Member of Clubs or Organizations: Not on file  . Attends Archivist Meetings: Not on file  . Marital Status: Not on file    Family History  Problem Relation Age of Onset  . Hypertension Mother   . Diabetes Mother   . Heart  disease Mother   . Crohn's disease Father   . Alcohol abuse Father   . Drug abuse Father   . Mental illness Father   . Cancer Father   . Hypertension Maternal Aunt   . Seizures Maternal Aunt   . Depression Maternal Aunt   . Asthma Maternal Uncle   . Hypertension Maternal Uncle   . Cancer Maternal Grandmother   . Diabetes Maternal Grandmother   . Mental illness Maternal Grandfather     Outpatient Encounter Medications as of 10/12/2020  Medication Sig  . albuterol (PROVENTIL HFA;VENTOLIN HFA) 108 (90 BASE) MCG/ACT inhaler Inhale 2 puffs into the lungs every 6 (six) hours as needed. For shortness of breath  . citalopram (CELEXA) 10 MG tablet Take 10 mg by mouth daily.  . furosemide (LASIX) 20 MG tablet Take 2 tablets (40 mg total) by mouth daily.  Marland Kitchen gabapentin (NEURONTIN) 100 MG capsule Take by mouth.  . Insulin Glargine (BASAGLAR KWIKPEN [place]) Inject 30 Units into the skin at bedtime.  . insulin lispro (HUMALOG) 100 UNIT/ML injection Inject 8-14 Units into the skin 3 (three) times daily before meals.  Marland Kitchen levofloxacin (LEVAQUIN) 500 MG tablet Take 500 mg by mouth daily.  Marland Kitchen lisinopril (ZESTRIL) 10 MG tablet Take 1  tablet (10 mg total) by mouth daily.  . traZODone (DESYREL) 50 MG tablet Take 50 mg by mouth at bedtime.  . [DISCONTINUED] furosemide (LASIX) 20 MG tablet Take 1 tablet (20 mg total) by mouth daily.  . traMADol (ULTRAM) 50 MG tablet Take 50 mg by mouth at bedtime. (Patient not taking: Reported on 10/12/2020)   No facility-administered encounter medications on file as of 10/12/2020.    ALLERGIES: Allergies  Allergen Reactions  . Amoxicillin Hives    Did it involve swelling of the face/tongue/throat, SOB, or low BP? no  Did it involve sudden or severe rash/hives, skin peeling, or any reaction on the inside of your mouth or nose? no Did you need to seek medical attention at a hospital or doctor's office? no When did it last happen?unk If all above answers are "NO", may proceed with cephalosporin use.  . Augmentin [Amoxicillin-Pot Clavulanate] Other (See Comments)    Reaction unknown  . Ceftriaxone Sodium In Dextrose   . Clavulanic Acid   . Adhesive [Tape] Itching and Rash  . Latex Itching and Rash  . Other Itching and Rash  . Pineapple Rash    Rash on tongue and throat    VACCINATION STATUS: Immunization History  Administered Date(s) Administered  . HPV Quadrivalent 08/27/2012  . Hepatitis A 08/27/2012  . Influenza Split 08/27/2012  . Meningococcal Conjugate 08/27/2012  . Moderna SARS-COVID-2 Vaccination 03/02/2020, 03/29/2020    Diabetes She presents for her follow-up diabetic visit. She has type 1 diabetes mellitus. Onset time: She was diagnosed at approximate age of 67 years. Her disease course has been worsening. There are no hypoglycemic associated symptoms. Pertinent negatives for hypoglycemia include no confusion, headaches, pallor or seizures. Associated symptoms include blurred vision, fatigue, foot paresthesias, foot ulcerations, polydipsia and polyuria. Pertinent negatives for diabetes include no chest pain and no polyphagia. There are no hypoglycemic  complications. Symptoms are worsening. Diabetic complications include peripheral neuropathy. (Lack of adequate health insurance.  Noncompliance/nonengagement.) Risk factors for coronary artery disease include diabetes mellitus and family history. Current diabetic treatment includes insulin injections. Her weight is increasing steadily (She has bilateral lower extremity swelling.). She is following a generally unhealthy diet. When asked about meal planning, she reported none. She  has not had a previous visit with a dietitian. She never participates in exercise. Her home blood glucose trend is increasing steadily. (She was previously seen with high A1c of 14% in March 2021.  She failed to return for follow-up.  She is returning with even higher A1c of greater than 15%.  She is accompanied by her aide from integrative medicine.  She is currently on Basaglar 35 units twice daily, Humalog 5-10 units 3 times daily AC.  She did not bring any logs nor meter to review.  She denies any recent hypoglycemic episodes.    ) An ACE inhibitor/angiotensin II receptor blocker is being taken.     Review of Systems  Constitutional: Positive for fatigue. Negative for chills, fever and unexpected weight change.  HENT: Negative for trouble swallowing and voice change.   Eyes: Positive for blurred vision. Negative for visual disturbance.  Respiratory: Negative for cough, shortness of breath and wheezing.   Cardiovascular: Positive for leg swelling. Negative for chest pain and palpitations.  Gastrointestinal: Negative for diarrhea, nausea and vomiting.  Endocrine: Positive for polydipsia and polyuria. Negative for cold intolerance, heat intolerance and polyphagia.  Musculoskeletal: Negative for arthralgias and myalgias.  Skin: Negative for color change, pallor, rash and wound.  Neurological: Negative for seizures and headaches.  Psychiatric/Behavioral: Negative for confusion and suicidal ideas.    Objective:    Vitals  with BMI 10/12/2020 10/11/2020 09/27/2020  Height 4' 11.25" 4' 11.25" 4' 11.25"  Weight 123 lbs 3 oz 123 lbs 11 oz 112 lbs  BMI 24.67 16.38 46.65  Systolic 993 570 177  Diastolic 76 90 90  Pulse 939 110 110    BP 124/76 (BP Location: Right Arm, Patient Position: Sitting)   Pulse (!) 109   Ht 4' 11.25" (1.505 m)   Wt 123 lb 3.2 oz (55.9 kg)   BMI 24.67 kg/m   Wt Readings from Last 3 Encounters:  10/12/20 123 lb 3.2 oz (55.9 kg)  10/11/20 123 lb 11.2 oz (56.1 kg)  09/27/20 112 lb (50.8 kg)     Physical Exam Constitutional:      Appearance: She is well-developed.  HENT:     Head: Normocephalic and atraumatic.  Neck:     Thyroid: No thyromegaly.     Trachea: No tracheal deviation.  Cardiovascular:     Rate and Rhythm: Normal rate.  Pulmonary:     Effort: Pulmonary effort is normal.  Abdominal:     Tenderness: There is no abdominal tenderness. There is no guarding.  Musculoskeletal:        General: Swelling present. Normal range of motion.     Cervical back: Normal range of motion and neck supple.  Skin:    General: Skin is warm and dry.     Coloration: Skin is not pale.     Findings: No erythema or rash.  Neurological:     Mental Status: She is alert and oriented to person, place, and time.     Cranial Nerves: No cranial nerve deficit.     Coordination: Coordination normal.     Deep Tendon Reflexes: Reflexes are normal and symmetric.  Psychiatric:     Comments: Patient is very reluctant.  She displays unconcerned affect.     CMP ( most recent) CMP     Component Value Date/Time   NA 135 08/26/2020 0507   K 3.6 08/26/2020 0507   CL 102 08/26/2020 0507   CO2 28 08/26/2020 0507   GLUCOSE 156 (H) 08/26/2020 0507  BUN 5 (L) 08/26/2020 0507   CREATININE 0.64 08/26/2020 0507   CREATININE 0.69 07/08/2012 1203   CALCIUM 8.3 (L) 08/26/2020 0507   PROT 7.1 08/25/2020 1941   ALBUMIN 2.5 (L) 08/25/2020 1941   AST 15 08/25/2020 1941   ALT 17 08/25/2020 1941   ALKPHOS  121 08/25/2020 1941   BILITOT 0.9 08/25/2020 1941   GFRNONAA >60 08/26/2020 0507   GFRAA >60 08/26/2020 0507    Diabetic Labs (most recent): Lab Results  Component Value Date   HGBA1C >15.5 (H) 08/25/2020   HGBA1C 14.0 02/03/2020   HGBA1C 13.2 03/20/2017    Lab Results  Component Value Date   TSH 1.078 08/25/2020   TSH 1.181 07/08/2012      Assessment & Plan:   1. Uncontrolled type 1 diabetes mellitus with hyperglycemia (HCC) - SISTER CARBONE has currently uncontrolled symptomatic type 1 DM since  24 years of age. She was briefly seen in 2018, reappeared in March 2021 when she was given a plan for intensive treatment with basal/bolus insulin and advised to return in 1 week, patient failed to return.   -  She is returning with even higher A1c of greater than 15%.  She is accompanied by her aide from integrative medicine.  She is currently on Basaglar 35 units twice daily, Humalog 5-10 units 3 times daily AC.  She did not bring any logs nor meter to review.  She denies any recent hypoglycemic episodes.    - Recent labs reviewed. - I had a long discussion with her about the progressive nature of diabetes and the pathology behind its complications. -her diabetes is complicated by chronic nonadherence/noncompliance, peripheral neuropathy and she remains at exceedingly  high risk for more acute and chronic complications which include CAD, CVA, CKD, retinopathy, and neuropathy. These are all discussed in detail with her.  - I have counseled her on diet management  by adopting a carbohydrate restricted/protein rich diet. Patient is encouraged to switch to  unprocessed or minimally processed     complex starch and increased protein intake (animal or plant source), fruits, and vegetables. -  she is advised to stick to a routine mealtimes to eat 3 meals  a day and avoid unnecessary snacks ( to snack only to correct hypoglycemia).    -  Suggestion is made for her to avoid simple  carbohydrates  from her diet including Cakes, Sweet Desserts / Pastries, Ice Cream, Soda (diet and regular), Sweet Tea, Candies, Chips, Cookies, Sweet Pastries,  Store Bought Juices, Alcohol in Excess of  1-2 drinks a day, Artificial Sweeteners, Coffee Creamer, and "Sugar-free" Products. This will help patient to have stable blood glucose profile and potentially avoid unintended weight gain.   - she has been scheduled with Jearld Fenton, RDN, CDE for diabetes education.  She did not keep her follow-up.  - I have reapproached her with the following individualized plan to manage  her diabetes and patient agrees:   - she has an aide from integrative medicine who promises to offer her help.   -She is advised to readjust her Basaglar to 30 units nightly, adjusted her Humalog to 8  units 3 times a day with meals  for pre-meal BG readings of 90-166m/dl, plus patient specific correction dose for unexpected hyperglycemia above 156mdl, associated with strict monitoring of glucose 4 times a day-before meals and at bedtime. - she is warned not to take insulin without proper monitoring per orders. - Adjustment parameters are given to her for  hypo and hyperglycemia in writing. - she is encouraged to call clinic for blood glucose levels less than 70 or above 300 mg /dl.   - she is not a candidate for metformin, SGLT2 inhibitors, nor incretin therapy.   - Specific targets for  A1c;  LDL, HDL,  and Triglycerides were discussed with the patient.  2) Blood Pressure /Hypertension:  her blood pressure is controlled to target.   she is advised to continue her current medications including hydrochlorothiazide 25 mg p.o. daily with breakfast .  3) Lipids/Hyperlipidemia: She does not have any lipid panel to review.     She will be considered for fasting lipid panel on subsequent visits.   4)  Weight/Diet:  Body mass index is 24.67 kg/m.  -She is not a candidate for weight loss.  Exercise, and detailed  carbohydrates information provided  -  detailed on discharge instructions.  5) Chronic Care/Health Maintenance:  -she  is not  on ACEI/ARB and Statin medications and  is encouraged to initiate and continue to follow up with Ophthalmology, Dentist,  Podiatrist at least yearly or according to recommendations, and advised to   stay away from smoking. I have recommended yearly flu vaccine and pneumonia vaccine at least every 5 years; moderate intensity exercise for up to 150 minutes weekly; and  sleep for at least 7 hours a day.  -She is advised to increase her Lasix to 40 mg p.o. every morning.  This patient is in desperate need for social worker to assist with her social life.  She has significant social constraints including lack of health  insurance which are interfering with her diabetes care.  - she is  advised to maintain close follow up with her PCP for primary care needs, as well as her other providers for optimal and coordinated care.   - Time spent in this patient care: 60 min, of which > 50% was spent in  counseling  her about her currently and chronically uncontrolled type 1 diabetes, hypertension and the rest reviewing her blood glucose logs , discussing her hypoglycemia and hyperglycemia episodes, reviewing her current and  previous labs / studies  ( including abstraction from other facilities) and medications  doses and developing a  long term treatment plan based on the latest standards of care/ guidelines; and documenting her care.    Please refer to Patient Instructions for Blood Glucose Monitoring and Insulin/Medications Dosing Guide"  in media tab for additional information. Please  also refer to " Patient Self Inventory" in the Media  tab for reviewed elements of pertinent patient history.  [name] participated in the discussions, expressed understanding, and voiced agreement with the above plans.  All questions were answered to her satisfaction. she is encouraged to contact  clinic should she have any questions or concerns prior to her return visit.   Follow up plan: - Return in about 1 week (around 10/19/2020) for F/U with Meter and Logs Only - no Labs.  Glade Lloyd, MD [facility] [address] [place], [place] [address] Phone: [phone]  Fax: [phone]    10/12/2020, 4:00 PM  This note was partially dictated with voice recognition software. Similar sounding words can be transcribed inadequately or may not  be corrected upon review.

## 2020-10-12 NOTE — Patient Instructions (Signed)

## 2020-10-13 ENCOUNTER — Other Ambulatory Visit: Payer: Self-pay | Admitting: Physician Assistant

## 2020-10-13 MED ORDER — GABAPENTIN 100 MG PO CAPS
100.0000 mg | ORAL_CAPSULE | Freq: Every day | ORAL | 0 refills | Status: DC
Start: 2020-10-13 — End: 2021-03-10

## 2020-10-17 ENCOUNTER — Encounter: Payer: Self-pay | Admitting: Internal Medicine

## 2020-10-18 ENCOUNTER — Inpatient Hospital Stay (HOSPITAL_COMMUNITY)
Admission: EM | Admit: 2020-10-18 | Discharge: 2020-10-21 | DRG: 638 | Disposition: A | Discharge status: Home / Self Care | Payer: Self-pay | Attending: Family Medicine | Admitting: Family Medicine

## 2020-10-18 ENCOUNTER — Inpatient Hospital Stay (HOSPITAL_COMMUNITY): Payer: Self-pay

## 2020-10-18 ENCOUNTER — Encounter: Payer: Self-pay | Admitting: "Endocrinology

## 2020-10-18 ENCOUNTER — Other Ambulatory Visit: Payer: Self-pay

## 2020-10-18 ENCOUNTER — Ambulatory Visit: Payer: Medicaid Other | Admitting: Physician Assistant

## 2020-10-18 ENCOUNTER — Ambulatory Visit (INDEPENDENT_AMBULATORY_CARE_PROVIDER_SITE_OTHER): Payer: Self-pay | Admitting: "Endocrinology

## 2020-10-18 ENCOUNTER — Emergency Department (HOSPITAL_COMMUNITY): Payer: Self-pay

## 2020-10-18 ENCOUNTER — Encounter (HOSPITAL_COMMUNITY): Payer: Self-pay | Admitting: Emergency Medicine

## 2020-10-18 VITALS — BP 140/78 | HR 96 | Ht 59.0 in | Wt 135.4 lb

## 2020-10-18 DIAGNOSIS — F319 Bipolar disorder, unspecified: Secondary | ICD-10-CM | POA: Diagnosis present

## 2020-10-18 DIAGNOSIS — R140 Abdominal distension (gaseous): Secondary | ICD-10-CM

## 2020-10-18 DIAGNOSIS — Z91040 Latex allergy status: Secondary | ICD-10-CM

## 2020-10-18 DIAGNOSIS — R809 Proteinuria, unspecified: Secondary | ICD-10-CM | POA: Diagnosis present

## 2020-10-18 DIAGNOSIS — F909 Attention-deficit hyperactivity disorder, unspecified type: Secondary | ICD-10-CM | POA: Diagnosis present

## 2020-10-18 DIAGNOSIS — Z818 Family history of other mental and behavioral disorders: Secondary | ICD-10-CM

## 2020-10-18 DIAGNOSIS — Z9119 Patient's noncompliance with other medical treatment and regimen: Secondary | ICD-10-CM

## 2020-10-18 DIAGNOSIS — R609 Edema, unspecified: Secondary | ICD-10-CM | POA: Diagnosis present

## 2020-10-18 DIAGNOSIS — E1040 Type 1 diabetes mellitus with diabetic neuropathy, unspecified: Secondary | ICD-10-CM | POA: Diagnosis present

## 2020-10-18 DIAGNOSIS — E1065 Type 1 diabetes mellitus with hyperglycemia: Principal | ICD-10-CM

## 2020-10-18 DIAGNOSIS — Z87891 Personal history of nicotine dependence: Secondary | ICD-10-CM

## 2020-10-18 DIAGNOSIS — R339 Retention of urine, unspecified: Secondary | ICD-10-CM | POA: Diagnosis present

## 2020-10-18 DIAGNOSIS — Z794 Long term (current) use of insulin: Secondary | ICD-10-CM

## 2020-10-18 DIAGNOSIS — E871 Hypo-osmolality and hyponatremia: Secondary | ICD-10-CM

## 2020-10-18 DIAGNOSIS — Z91018 Allergy to other foods: Secondary | ICD-10-CM

## 2020-10-18 DIAGNOSIS — R739 Hyperglycemia, unspecified: Secondary | ICD-10-CM | POA: Diagnosis present

## 2020-10-18 DIAGNOSIS — Z20822 Contact with and (suspected) exposure to covid-19: Secondary | ICD-10-CM | POA: Diagnosis present

## 2020-10-18 DIAGNOSIS — Z91048 Other nonmedicinal substance allergy status: Secondary | ICD-10-CM

## 2020-10-18 DIAGNOSIS — Z8673 Personal history of transient ischemic attack (TIA), and cerebral infarction without residual deficits: Secondary | ICD-10-CM

## 2020-10-18 DIAGNOSIS — E8809 Other disorders of plasma-protein metabolism, not elsewhere classified: Secondary | ICD-10-CM

## 2020-10-18 DIAGNOSIS — R1115 Cyclical vomiting syndrome unrelated to migraine: Secondary | ICD-10-CM | POA: Diagnosis present

## 2020-10-18 DIAGNOSIS — Z79899 Other long term (current) drug therapy: Secondary | ICD-10-CM

## 2020-10-18 DIAGNOSIS — Z833 Family history of diabetes mellitus: Secondary | ICD-10-CM

## 2020-10-18 DIAGNOSIS — Z881 Allergy status to other antibiotic agents status: Secondary | ICD-10-CM

## 2020-10-18 DIAGNOSIS — R601 Generalized edema: Secondary | ICD-10-CM

## 2020-10-18 DIAGNOSIS — D649 Anemia, unspecified: Secondary | ICD-10-CM | POA: Diagnosis present

## 2020-10-18 DIAGNOSIS — Z880 Allergy status to penicillin: Secondary | ICD-10-CM

## 2020-10-18 DIAGNOSIS — E8770 Fluid overload, unspecified: Secondary | ICD-10-CM | POA: Diagnosis present

## 2020-10-18 DIAGNOSIS — Z91199 Patient's noncompliance with other medical treatment and regimen due to unspecified reason: Secondary | ICD-10-CM

## 2020-10-18 LAB — BLOOD GAS, VENOUS
Acid-Base Excess: 2.2 mmol/L — ABNORMAL HIGH (ref 0.0–2.0)
Bicarbonate: 24.6 mmol/L (ref 20.0–28.0)
FIO2: 21
O2 Saturation: 35.8 %
Patient temperature: 37.4
pCO2, Ven: 55.3 mmHg (ref 44.0–60.0)
pH, Ven: 7.322 (ref 7.250–7.430)
pO2, Ven: <31 mmHg — CL (ref 32.0–45.0)

## 2020-10-18 LAB — CBC WITH DIFFERENTIAL/PLATELET
Abs Immature Granulocytes: 0.02 10*3/uL (ref 0.00–0.07)
Basophils Absolute: 0 10*3/uL (ref 0.0–0.1)
Basophils Relative: 1 %
Eosinophils Absolute: 0 10*3/uL (ref 0.0–0.5)
Eosinophils Relative: 0 %
HCT: 35.7 % — ABNORMAL LOW (ref 36.0–46.0)
Hemoglobin: 11.4 g/dL — ABNORMAL LOW (ref 12.0–15.0)
Immature Granulocytes: 0 %
Lymphocytes Relative: 26 %
Lymphs Abs: 1.3 10*3/uL (ref 0.7–4.0)
MCH: 29.7 pg (ref 26.0–34.0)
MCHC: 31.9 g/dL (ref 30.0–36.0)
MCV: 93 fL (ref 80.0–100.0)
Monocytes Absolute: 0.4 10*3/uL (ref 0.1–1.0)
Monocytes Relative: 7 %
Neutro Abs: 3.3 10*3/uL (ref 1.7–7.7)
Neutrophils Relative %: 66 %
Platelets: 294 10*3/uL (ref 150–400)
RBC: 3.84 MIL/uL — ABNORMAL LOW (ref 3.87–5.11)
RDW: 13.2 % (ref 11.5–15.5)
WBC: 5 10*3/uL (ref 4.0–10.5)
nRBC: 0 % (ref 0.0–0.2)

## 2020-10-18 LAB — RESPIRATORY PANEL BY RT PCR (FLU A&B, COVID)
Influenza A by PCR: NEGATIVE
Influenza B by PCR: NEGATIVE
SARS Coronavirus 2 by RT PCR: NEGATIVE

## 2020-10-18 LAB — GLUCOSE, CAPILLARY: Glucose-Capillary: 166 mg/dL — ABNORMAL HIGH (ref 70–99)

## 2020-10-18 LAB — COMPREHENSIVE METABOLIC PANEL
ALT: 89 U/L — ABNORMAL HIGH (ref 0–44)
AST: 78 U/L — ABNORMAL HIGH (ref 15–41)
Albumin: 2.6 g/dL — ABNORMAL LOW (ref 3.5–5.0)
Alkaline Phosphatase: 126 U/L (ref 38–126)
Anion gap: 6 (ref 5–15)
BUN: 14 mg/dL (ref 6–20)
CO2: 26 mmol/L (ref 22–32)
Calcium: 8.7 mg/dL — ABNORMAL LOW (ref 8.9–10.3)
Chloride: 96 mmol/L — ABNORMAL LOW (ref 98–111)
Creatinine, Ser: 0.83 mg/dL (ref 0.44–1.00)
GFR, Estimated: >60 mL/min (ref >60)
Glucose, Bld: 598 mg/dL (ref 70–99)
Potassium: 4.4 mmol/L (ref 3.5–5.1)
Sodium: 128 mmol/L — ABNORMAL LOW (ref 135–145)
Total Bilirubin: 0.7 mg/dL (ref 0.3–1.2)
Total Protein: 7.2 g/dL (ref 6.5–8.1)

## 2020-10-18 LAB — CBG MONITORING, ED
Glucose-Capillary: 536 mg/dL (ref 70–99)
Glucose-Capillary: 418 mg/dL — ABNORMAL HIGH (ref 70–99)
Glucose-Capillary: 392 mg/dL — ABNORMAL HIGH (ref 70–99)
Glucose-Capillary: 240 mg/dL — ABNORMAL HIGH (ref 70–99)
Glucose-Capillary: 275 mg/dL — ABNORMAL HIGH (ref 70–99)
Glucose-Capillary: 155 mg/dL — ABNORMAL HIGH (ref 70–99)
Glucose-Capillary: 114 mg/dL — ABNORMAL HIGH (ref 70–99)
Glucose-Capillary: 140 mg/dL — ABNORMAL HIGH (ref 70–99)
Glucose-Capillary: 137 mg/dL — ABNORMAL HIGH (ref 70–99)
Glucose-Capillary: 154 mg/dL — ABNORMAL HIGH (ref 70–99)

## 2020-10-18 LAB — BASIC METABOLIC PANEL
Anion gap: 8 (ref 5–15)
BUN: 13 mg/dL (ref 6–20)
CO2: 27 mmol/L (ref 22–32)
Calcium: 8.8 mg/dL — ABNORMAL LOW (ref 8.9–10.3)
Chloride: 100 mmol/L (ref 98–111)
Creatinine, Ser: 0.74 mg/dL (ref 0.44–1.00)
GFR, Estimated: >60 mL/min (ref >60)
Glucose, Bld: 173 mg/dL — ABNORMAL HIGH (ref 70–99)
Potassium: 4 mmol/L (ref 3.5–5.1)
Sodium: 135 mmol/L (ref 135–145)

## 2020-10-18 LAB — URINALYSIS, ROUTINE W REFLEX MICROSCOPIC
Bacteria, UA: NONE SEEN
Bilirubin Urine: NEGATIVE
Glucose, UA: >=500 mg/dL — AB
Ketones, ur: NEGATIVE mg/dL
Leukocytes,Ua: NEGATIVE
Nitrite: NEGATIVE
Protein, ur: 100 mg/dL — AB
Specific Gravity, Urine: 1.021 (ref 1.005–1.030)
pH: 7 (ref 5.0–8.0)

## 2020-10-18 LAB — PREGNANCY, URINE: Preg Test, Ur: NEGATIVE

## 2020-10-18 LAB — PROTEIN / CREATININE RATIO, URINE
Creatinine, Urine: 18.74 mg/dL
Protein Creatinine Ratio: 3.95 mg/mg{Cre} — ABNORMAL HIGH (ref 0.00–0.15)
Total Protein, Urine: 74 mg/dL

## 2020-10-18 LAB — POC URINE PREG, ED: Preg Test, Ur: NEGATIVE

## 2020-10-18 LAB — BETA-HYDROXYBUTYRIC ACID: Beta-Hydroxybutyric Acid: 0.09 mmol/L (ref 0.05–0.27)

## 2020-10-18 LAB — OSMOLALITY: Osmolality: 311 mOsm/kg — ABNORMAL HIGH (ref 275–295)

## 2020-10-18 LAB — LIPASE, BLOOD: Lipase: 53 U/L — ABNORMAL HIGH (ref 11–51)

## 2020-10-18 LAB — BRAIN NATRIURETIC PEPTIDE: B Natriuretic Peptide: 83 pg/mL (ref 0.0–100.0)

## 2020-10-18 MED ORDER — FUROSEMIDE 10 MG/ML IJ SOLN
40.0000 mg | Freq: Two times a day (BID) | INTRAMUSCULAR | Status: DC
  Administered 2020-10-18 – 2020-10-21 (×6): 40 mg via INTRAVENOUS
  Filled 2020-10-18 (×6): qty 4

## 2020-10-18 MED ORDER — INSULIN REGULAR(HUMAN) IN NACL 100-0.9 UT/100ML-% IV SOLN
INTRAVENOUS | Status: DC
  Administered 2020-10-18: 9 [IU]/h via INTRAVENOUS
  Filled 2020-10-18: qty 100

## 2020-10-18 MED ORDER — DEXTROSE 50 % IV SOLN: 0.0000 mL | INTRAVENOUS | Status: DC | PRN

## 2020-10-18 MED ORDER — INSULIN ASPART 100 UNIT/ML ~~LOC~~ SOLN
0.0000 [IU] | Freq: Three times a day (TID) | SUBCUTANEOUS | Status: DC
  Administered 2020-10-19: 5 [IU] via SUBCUTANEOUS
  Administered 2020-10-19 – 2020-10-20 (×2): 3 [IU] via SUBCUTANEOUS

## 2020-10-18 MED ORDER — INSULIN REGULAR(HUMAN) IN NACL 100-0.9 UT/100ML-% IV SOLN
INTRAVENOUS | Status: DC
  Filled 2020-10-18: qty 100

## 2020-10-18 MED ORDER — DEXTROSE IN LACTATED RINGERS 5 % IV SOLN: INTRAVENOUS | Status: DC

## 2020-10-18 MED ORDER — LISINOPRIL 10 MG PO TABS
10.0000 mg | ORAL_TABLET | Freq: Every day | ORAL | Status: DC
  Administered 2020-10-19 – 2020-10-20 (×2): 10 mg via ORAL
  Filled 2020-10-18 (×3): qty 1

## 2020-10-18 MED ORDER — ENOXAPARIN SODIUM 40 MG/0.4ML ~~LOC~~ SOLN
40.0000 mg | SUBCUTANEOUS | Status: DC
  Administered 2020-10-18 – 2020-10-20 (×3): 40 mg via SUBCUTANEOUS
  Filled 2020-10-18 (×3): qty 0.4

## 2020-10-18 MED ORDER — KETOROLAC TROMETHAMINE 30 MG/ML IJ SOLN
30.0000 mg | Freq: Once | INTRAMUSCULAR | Status: AC
  Administered 2020-10-18: 30 mg via INTRAVENOUS
  Filled 2020-10-18: qty 1

## 2020-10-18 MED ORDER — INSULIN GLARGINE 100 UNIT/ML ~~LOC~~ SOLN
40.0000 [IU] | SUBCUTANEOUS | Status: DC
  Administered 2020-10-18 – 2020-10-19 (×2): 40 [IU] via SUBCUTANEOUS
  Filled 2020-10-18 (×3): qty 0.4

## 2020-10-18 MED ORDER — LACTATED RINGERS IV SOLN: INTRAVENOUS | Status: DC

## 2020-10-18 MED ORDER — GABAPENTIN 100 MG PO CAPS
100.0000 mg | ORAL_CAPSULE | Freq: Every day | ORAL | Status: DC
  Administered 2020-10-19 – 2020-10-21 (×3): 100 mg via ORAL
  Filled 2020-10-18 (×3): qty 1

## 2020-10-18 MED ORDER — TRAZODONE HCL 50 MG PO TABS
50.0000 mg | ORAL_TABLET | Freq: Every day | ORAL | Status: DC
  Administered 2020-10-18 – 2020-10-20 (×3): 50 mg via ORAL
  Filled 2020-10-18 (×3): qty 1

## 2020-10-18 MED ORDER — CITALOPRAM HYDROBROMIDE 20 MG PO TABS
10.0000 mg | ORAL_TABLET | Freq: Every day | ORAL | Status: DC
  Administered 2020-10-19 – 2020-10-21 (×3): 10 mg via ORAL
  Filled 2020-10-18 (×4): qty 1

## 2020-10-18 NOTE — ED Triage Notes (Signed)
Pt was sent by [name] for evaluation due to unmanaged DM and increased fluid retention over the past few weeks. Pt has been on lasix that was recently increased from 20 to 40 mg per day.

## 2020-10-18 NOTE — Patient Instructions (Signed)
To ER today.

## 2020-10-18 NOTE — Progress Notes (Signed)
10/18/2020, 12:39 PM  Endocrinology follow-up note   Subjective:    Patient ID: Diamond [name], female    DOB: March 11, 1996.  Diamond [name] is being seen in follow-up after she was seen in consultation for management of currently uncontrolled symptomatic type I diabetes. She missed her appointment since March 2021.  She was previously seen in 2018 and disappeared for 3 years from clinic.  Past Medical History:  Diagnosis Date  . ADHD (attention deficit hyperactivity disorder) 2007  . Allergy   . Asthma   . Bipolar disorder ([place])   . Crohn's colitis ([place])    Dx Dr. Cherrie Gauze  . Depression    early childhood, trial of seroquel?  . Diabetic neuropathy ([place])   . GERD (gastroesophageal reflux disease)   . Hypertension   . Pancreatitis   . Panic attacks   . PTSD (post-traumatic stress disorder)   . Stroke ([place]) 07/2020   R side  . Type 1 diabetes ([place]) 2008    Past Surgical History:  Procedure Laterality Date  . ADENOIDECTOMY    . HERNIA REPAIR    . TONSILLECTOMY      Social History   Socioeconomic History  . Marital status: Single    Spouse name: Not on file  . Number of children: Not on file  . Years of education: Not on file  . Highest education level: Not on file  Occupational History  . Not on file  Tobacco Use  . Smoking status: Former Smoker    Types: Cigarettes    Quit date: 12/03/2008    Years since quitting: 11.8  . Smokeless tobacco: Never Used  Vaping Use  . Vaping Use: Never used  Substance and Sexual Activity  . Alcohol use: No  . Drug use: No  . Sexual activity: Yes  Other Topics Concern  . Not on file  Social History Narrative  . Not on file   Social Determinants of Health   Financial Resource Strain:   . Difficulty of Paying Living Expenses: Not on file  Food Insecurity:   . Worried About Charity fundraiser in the Last Year: Not on file   . Ran Out of Food in the Last Year: Not on file  Transportation Needs:   . Lack of Transportation (Medical): Not on file  . Lack of Transportation (Non-Medical): Not on file  Physical Activity:   . Days of Exercise per Week: Not on file  . Minutes of Exercise per Session: Not on file  Stress:   . Feeling of Stress : Not on file  Social Connections:   . Frequency of Communication with Friends and Family: Not on file  . Frequency of Social Gatherings with Friends and Family: Not on file  . Attends Religious Services: Not on file  . Active Member of Clubs or Organizations: Not on file  . Attends Archivist Meetings: Not on file  . Marital Status: Not on file    Family History  Problem Relation Age of Onset  . Hypertension Mother   . Diabetes Mother   . Heart  disease Mother   . Crohn's disease Father   . Alcohol abuse Father   . Drug abuse Father   . Mental illness Father   . Cancer Father   . Hypertension Maternal Aunt   . Seizures Maternal Aunt   . Depression Maternal Aunt   . Asthma Maternal Uncle   . Hypertension Maternal Uncle   . Cancer Maternal Grandmother   . Diabetes Maternal Grandmother   . Mental illness Maternal Grandfather     Outpatient Encounter Medications as of 10/18/2020  Medication Sig  . albuterol (PROVENTIL HFA;VENTOLIN HFA) 108 (90 BASE) MCG/ACT inhaler Inhale 2 puffs into the lungs every 6 (six) hours as needed. For shortness of breath  . citalopram (CELEXA) 10 MG tablet Take 10 mg by mouth daily.  . furosemide (LASIX) 20 MG tablet Take 2 tablets (40 mg total) by mouth daily.  Marland Kitchen gabapentin (NEURONTIN) 100 MG capsule Take 1 capsule (100 mg total) by mouth daily.  . Insulin Glargine (BASAGLAR KWIKPEN [place]) Inject 40 Units into the skin at bedtime.  . insulin lispro (HUMALOG) 100 UNIT/ML injection Inject 10-16 Units into the skin 3 (three) times daily before meals.  Marland Kitchen levofloxacin (LEVAQUIN) 500 MG tablet Take 500 mg by mouth daily.  Marland Kitchen  lisinopril (ZESTRIL) 10 MG tablet Take 1 tablet (10 mg total) by mouth daily.  . traMADol (ULTRAM) 50 MG tablet Take 50 mg by mouth at bedtime. (Patient not taking: Reported on 10/12/2020)  . traZODone (DESYREL) 50 MG tablet Take 50 mg by mouth at bedtime.   No facility-administered encounter medications on file as of 10/18/2020.    ALLERGIES: Allergies  Allergen Reactions  . Amoxicillin Hives    Did it involve swelling of the face/tongue/throat, SOB, or low BP? no  Did it involve sudden or severe rash/hives, skin peeling, or any reaction on the inside of your mouth or nose? no Did you need to seek medical attention at a hospital or doctor's office? no When did it last happen?unk If all above answers are "NO", may proceed with cephalosporin use.  . Augmentin [Amoxicillin-Pot Clavulanate] Other (See Comments)    Reaction unknown  . Ceftriaxone Sodium In Dextrose   . Clavulanic Acid   . Adhesive [Tape] Itching and Rash  . Latex Itching and Rash  . Other Itching and Rash  . Pineapple Rash    Rash on tongue and throat    VACCINATION STATUS: Immunization History  Administered Date(s) Administered  . HPV Quadrivalent 08/27/2012  . Hepatitis A 08/27/2012  . Influenza Split 08/27/2012  . Meningococcal Conjugate 08/27/2012  . Moderna SARS-COVID-2 Vaccination 03/02/2020, 03/29/2020    Diabetes She presents for her follow-up diabetic visit. She has type 1 diabetes mellitus. Onset time: She was diagnosed at approximate age of 74 years. Her disease course has been worsening. Pertinent negatives for hypoglycemia include no confusion, headaches, pallor or seizures. Associated symptoms include blurred vision, fatigue, foot paresthesias, foot ulcerations, polydipsia and polyuria. Pertinent negatives for diabetes include no chest pain and no polyphagia. There are no hypoglycemic complications. Symptoms are worsening. Diabetic complications include peripheral neuropathy. (Lack of adequate  health insurance.  Noncompliance/nonengagement.) Risk factors for coronary artery disease include diabetes mellitus, family history, sedentary lifestyle and tobacco exposure. Current diabetic treatment includes insulin injections. She is compliant with treatment some of the time. Her weight is increasing steadily (She has gained 20 pounds since September 27, 2020, largely driven by anasarca.  ). She is following a generally unhealthy diet. When asked about meal  planning, she reported none. She has not had a previous visit with a dietitian. She never participates in exercise. Her home blood glucose trend is fluctuating minimally. Her breakfast blood glucose range is generally >200 mg/dl. Her lunch blood glucose range is generally >200 mg/dl. Her dinner blood glucose range is generally >200 mg/dl. Her bedtime blood glucose range is generally >200 mg/dl. Her overall blood glucose range is >200 mg/dl. (She presents with fluctuating glycemic profile, no hypoglycemia.  She did not log her insulin administration records.    She was previously seen with high A1c of 14% in March 2021.  She failed to return for follow-up.  She recently showed up with  even higher A1c of greater than 15%.  She was given a new plan to treat type 1 diabetes with multiple daily injections of insulin.  She is accompanied by her aide from integrative medicine.    She denies any recent hypoglycemic episodes.    ) An ACE inhibitor/angiotensin II receptor blocker is being taken.     Review of Systems  Constitutional: Positive for fatigue. Negative for chills, fever and unexpected weight change.  HENT: Negative for trouble swallowing and voice change.   Eyes: Positive for blurred vision. Negative for visual disturbance.  Respiratory: Negative for cough, shortness of breath and wheezing.   Cardiovascular: Positive for leg swelling. Negative for chest pain and palpitations.  Gastrointestinal: Negative for diarrhea, nausea and vomiting.   Endocrine: Positive for polydipsia and polyuria. Negative for cold intolerance, heat intolerance and polyphagia.  Musculoskeletal: Negative for arthralgias and myalgias.  Skin: Negative for color change, pallor, rash and wound.  Neurological: Negative for seizures and headaches.  Psychiatric/Behavioral: Negative for confusion and suicidal ideas.    Objective:    Vitals with BMI 10/18/2020 10/18/2020 10/18/2020  Height 4' 11"  - 4' 11"   Weight 135 lbs - 135 lbs 6 oz  BMI 62.83 - 66.29  Systolic - 476 546  Diastolic - 503 78  Pulse - 110 96    BP 140/78   Pulse 96   Ht 4' 11"  (1.499 m)   Wt 135 lb 6.4 oz (61.4 kg)   BMI 27.35 kg/m   Wt Readings from Last 3 Encounters:  10/18/20 135 lb (61.2 kg)  10/18/20 135 lb 6.4 oz (61.4 kg)  10/12/20 123 lb 3.2 oz (55.9 kg)     Physical Exam Constitutional:      Appearance: She is well-developed.     Comments: Patient with anasarca, weight gain of 20 pounds in 3 weeks.  HENT:     Head: Normocephalic and atraumatic.  Neck:     Thyroid: No thyromegaly.     Trachea: No tracheal deviation.  Cardiovascular:     Comments: Distant heart sounds. Pulmonary:     Effort: Pulmonary effort is normal.     Comments: Tight chest with poor air entry. Abdominal:     Tenderness: There is no abdominal tenderness. There is no guarding.  Musculoskeletal:        General: Swelling present. Normal range of motion.     Cervical back: Normal range of motion and neck supple.     Right lower leg: Edema present.     Left lower leg: Edema present.  Skin:    General: Skin is warm and dry.     Coloration: Skin is not pale.     Findings: No erythema or rash.  Neurological:     Mental Status: She is alert and oriented to person, place, and  time.     Cranial Nerves: No cranial nerve deficit.     Coordination: Coordination normal.     Deep Tendon Reflexes: Reflexes are normal and symmetric.  Psychiatric:     Comments: Patient is very reluctant.  She  displays unconcerned affect.     CMP ( most recent) CMP     Component Value Date/Time   NA 128 (L) 10/18/2020 1212   K 4.4 10/18/2020 1212   CL 96 (L) 10/18/2020 1212   CO2 26 10/18/2020 1212   GLUCOSE 598 (HH) 10/18/2020 1212   BUN 14 10/18/2020 1212   CREATININE 0.83 10/18/2020 1212   CREATININE 0.69 07/08/2012 1203   CALCIUM 8.7 (L) 10/18/2020 1212   PROT 7.2 10/18/2020 1212   ALBUMIN 2.6 (L) 10/18/2020 1212   AST 78 (H) 10/18/2020 1212   ALT 89 (H) 10/18/2020 1212   ALKPHOS 126 10/18/2020 1212   BILITOT 0.7 10/18/2020 1212   GFRNONAA >60 10/18/2020 1212   GFRAA >60 08/26/2020 0507    Diabetic Labs (most recent): Lab Results  Component Value Date   HGBA1C >15.5 (H) 08/25/2020   HGBA1C 14.0 02/03/2020   HGBA1C 13.2 03/20/2017    Lab Results  Component Value Date   TSH 1.078 08/25/2020   TSH 1.181 07/08/2012      Assessment & Plan:   1. Uncontrolled type 1 diabetes mellitus with hyperglycemia (HCC) - EDANA AGUADO has currently uncontrolled symptomatic type 1 DM since  24 years of age. Patient was found to have tight chest, anasarca, leading to probable suboptimal insulin absorption and distribution.  She was sent to ER with a note.  She needs work-up for etiology of anasarca.  She has hypoalbuminemia, suspect CHF.  She presents with fluctuating glycemic profile, no hypoglycemia.  She did not log her insulin administration records.   She was previously seen with high A1c of 14% in March 2021.  She failed to return for follow-up.  She recently showed up with  even higher A1c of greater than 15%.  She was given a new plan to treat type 1 diabetes with multiple daily injections of insulin.  She is accompanied by her aide from integrative medicine.    She denies any recent hypoglycemic episodes.    - Recent labs reviewed. - I had a long discussion with her about the progressive nature of diabetes and the pathology behind its complications. -her diabetes is  complicated by chronic nonadherence/noncompliance, peripheral neuropathy and she remains at exceedingly  high risk for more acute and chronic complications which include CAD, CVA, CKD, retinopathy, and neuropathy. These are all discussed in detail with her.  - I have counseled her on diet management  by adopting a carbohydrate restricted/protein rich diet. Patient is encouraged to switch to  unprocessed or minimally processed     complex starch and increased protein intake (animal or plant source), fruits, and vegetables. -  she is advised to stick to a routine mealtimes to eat 3 meals  a day and avoid unnecessary snacks ( to snack only to correct hypoglycemia).   -  Suggestion is made for her to avoid simple carbohydrates  from her diet including Cakes, Sweet Desserts / Pastries, Ice Cream, Soda (diet and regular), Sweet Tea, Candies, Chips, Cookies, Sweet Pastries,  Store Bought Juices, Alcohol in Excess of  1-2 drinks a day, Artificial Sweeteners, Coffee Creamer, and "Sugar-free" Products. This will help patient to have stable blood glucose profile and potentially avoid unintended weight gain.   -  she has been scheduled with Jearld Fenton, RDN, CDE for diabetes education.  She did not keep her follow-up.  - I have reapproached her with the following individualized plan to manage  her diabetes and patient agrees:   - she has an aide from integrative medicine who promises to offer her help.   -After her ER visit and treatment/discharge, she is advised to increase Basaglar to 40 units nightly, increase Humalog to 10 units 3 times a day with meals  for pre-meal BG readings of 90-124m/dl, plus patient specific correction dose for unexpected hyperglycemia above 1547mdl, associated with strict monitoring of glucose 4 times a day-before meals and at bedtime. - she is warned not to take insulin without proper monitoring per orders. - Adjustment parameters are given to her for hypo and hyperglycemia in  writing. - she is encouraged to call clinic for blood glucose levels less than 70 or above 300 mg /dl.  - she is not a candidate for metformin, SGLT2 inhibitors, nor incretin therapy.   - Specific targets for  A1c;  LDL, HDL,  and Triglycerides were discussed with the patient.  2) Blood Pressure /Hypertension: Her blood pressure is controlled to target.  she is advised to continue her current medications including hydrochlorothiazide 25 mg p.o. daily with breakfast .  3) Lipids/Hyperlipidemia: She does not have any lipid panel to review.     She will be considered for fasting lipid panel on subsequent visits.   4)  Weight/Diet:  Body mass index is 27.35 kg/m.  -She is not a candidate for weight loss.  Exercise, and detailed carbohydrates information provided  -  detailed on discharge instructions.  5) Chronic Care/Health Maintenance:  -she  is not  on ACEI/ARB and Statin medications and  is encouraged to initiate and continue to follow up with Ophthalmology, Dentist,  Podiatrist at least yearly or according to recommendations, and advised to   stay away from smoking. I have recommended yearly flu vaccine and pneumonia vaccine at least every 5 years; moderate intensity exercise for up to 150 minutes weekly; and  sleep for at least 7 hours a day.  -She is advised to increase her Lasix to 40 mg p.o. every morning.  This patient is in desperate need for social worker to assist with her social life.  She has significant social constraints including lack of health  insurance which are interfering with her diabetes care.  - she is  advised to maintain close follow up with her PCP for primary care needs, as well as her other providers for optimal and coordinated care.  - Time spent on this patient care encounter:  35 min, of which > 50% was spent in  counseling and the rest reviewing her blood glucose logs , discussing her hypoglycemia and hyperglycemia episodes, reviewing her current and  previous  labs / studies  ( including abstraction from other facilities) and medications  doses and developing a  long term treatment plan and documenting her care.   Please refer to Patient Instructions for Blood Glucose Monitoring and Insulin/Medications Dosing Guide"  in media tab for additional information. Please  also refer to " Patient Self Inventory" in the Media  tab for reviewed elements of pertinent patient history.  BrLourdes Sledgearticipated in the discussions, expressed understanding, and voiced agreement with the above plans.  All questions were answered to her satisfaction. she is encouraged to contact clinic should she have any questions or concerns prior to her return visit.  Follow up plan: - Return in about 2 weeks (around 11/01/2020) for F/U with Meter and Logs Only - no Labs.  Glade Lloyd, MD [facility] [address] [place],  [address] Phone: [phone]  Fax: [phone]    10/18/2020, 12:39 PM  This note was partially dictated with voice recognition software. Similar sounding words can be transcribed inadequately or may not  be corrected upon review.

## 2020-10-18 NOTE — ED Provider Notes (Signed)
[facility] EMERGENCY DEPARTMENT Provider Note   CSN: [id number] Arrival date & time: 10/18/20  1101     History Chief Complaint  Patient presents with  . Abdominal Pain    Diamond [name] is a 24 y.o. female.  24 year old female with history of poorly controlled type 1 diabetes, HTN, CVA with additional history as listed below, presents as recommended by her endocrinologist today for swelling/weight gain, uncontrolled hyperglycemia. Patient reports decline in health over the past month, progressively worsening body swelling, SHOB, persistently high blood sugars. Patient speaks with a very soft voice and provides little detail into her presentation today. Per report from endocrinology today, weight is up 20+ lbs over the past month, edematous/anasarca, concern for hypoalbuminemia, CHF.         Past Medical History:  Diagnosis Date  . ADHD (attention deficit hyperactivity disorder) 2007  . Allergy   . Asthma   . Bipolar disorder ([place])   . Crohn's colitis ([place])    Dx Dr. Cherrie Gauze  . Depression    early childhood, trial of seroquel?  . Diabetic neuropathy ([place])   . GERD (gastroesophageal reflux disease)   . Hypertension   . Pancreatitis   . Panic attacks   . PTSD (post-traumatic stress disorder)   . Stroke ([place]) 07/2020   R side  . Type 1 diabetes ([place]) 2008    Patient Active Problem List   Diagnosis Date Noted  . Personal history of noncompliance with medical treatment, presenting hazards to health 10/12/2020  . Bilateral lower extremity edema 08/25/2020  . Pneumonia 08/25/2020  . Hyperglycemia 08/25/2020  . Eczema 10/23/2012  . Abdominal pain 08/27/2012  . GERD (gastroesophageal reflux disease) 07/25/2012  . Mood disorder ([place]) 07/09/2012  . Contraception management 07/09/2012  . History of Crohn's disease 07/09/2012  . Uncontrolled type 1 diabetes mellitus with hyperglycemia ([place]) 07/08/2012  . Asthma, persistent controlled 07/08/2012    Past  Surgical History:  Procedure Laterality Date  . ADENOIDECTOMY    . HERNIA REPAIR    . TONSILLECTOMY       OB History   No obstetric history on file.     Family History  Problem Relation Age of Onset  . Hypertension Mother   . Diabetes Mother   . Heart disease Mother   . Crohn's disease Father   . Alcohol abuse Father   . Drug abuse Father   . Mental illness Father   . Cancer Father   . Hypertension Maternal Aunt   . Seizures Maternal Aunt   . Depression Maternal Aunt   . Asthma Maternal Uncle   . Hypertension Maternal Uncle   . Cancer Maternal Grandmother   . Diabetes Maternal Grandmother   . Mental illness Maternal Grandfather     Social History   Tobacco Use  . Smoking status: Former Smoker    Types: Cigarettes    Quit date: 12/03/2008    Years since quitting: 11.8  . Smokeless tobacco: Never Used  Vaping Use  . Vaping Use: Never used  Substance Use Topics  . Alcohol use: No  . Drug use: No    Home Medications Prior to Admission medications   Medication Sig Start Date End Date Taking? Authorizing Provider  albuterol (PROVENTIL HFA;VENTOLIN HFA) 108 (90 BASE) MCG/ACT inhaler Inhale 2 puffs into the lungs every 6 (six) hours as needed. For shortness of breath 07/08/12  Yes Clovis Cao, MD  citalopram (CELEXA) 10 MG tablet Take 10 mg by mouth  daily.   Yes [provider]  furosemide (LASIX) 20 MG tablet Take 2 tablets (40 mg total) by mouth daily. 10/12/20  Yes Nida, Marella Chimes, MD  gabapentin (NEURONTIN) 100 MG capsule Take 1 capsule (100 mg total) by mouth daily. 10/13/20  Yes [name], PA-C  Insulin Glargine (BASAGLAR KWIKPEN [place]) Inject 40 Units into the skin at bedtime.   Yes [provider]  insulin lispro (HUMALOG) 100 UNIT/ML injection Inject 10-16 Units into the skin 3 (three) times daily before meals.   Yes [provider]  lisinopril (ZESTRIL) 10 MG tablet Take 1 tablet (10 mg total) by mouth daily. 10/11/20  Yes  [name], PA-C  traZODone (DESYREL) 50 MG tablet Take 50 mg by mouth at bedtime.   Yes [provider]    Allergies    Amoxicillin, Penicillins, Augmentin [amoxicillin-pot clavulanate], Ceftriaxone sodium in dextrose, Clavulanic acid, Adhesive [tape], Latex, Other, and Pineapple  Review of Systems   Review of Systems  Reason unable to perform ROS: difficult historian   Constitutional: Negative for fever.  Respiratory: Positive for chest tightness and shortness of breath.   Cardiovascular: Positive for leg swelling. Negative for chest pain.  Gastrointestinal: Positive for abdominal distention. Negative for nausea and vomiting.  Genitourinary: Negative for decreased urine volume, difficulty urinating and dysuria.  Musculoskeletal: Positive for arthralgias, back pain and myalgias.  Skin: Negative for rash and wound.  Allergic/Immunologic: Positive for immunocompromised state.  Neurological: Positive for weakness.  Psychiatric/Behavioral: Negative for confusion.    Physical Exam Updated Vital Signs BP (!) 146/107   Pulse (!) 108   Temp 98.3 F (36.8 C) (Oral)   Resp 10   Ht 4' 11"  (1.499 m)   Wt 61.2 kg   SpO2 99%   BMI 27.27 kg/m   Physical Exam Vitals and nursing note reviewed.  Constitutional:      General: She is not in acute distress.    Appearance: She is well-developed. She is not diaphoretic.  HENT:     Head: Normocephalic and atraumatic.     Mouth/Throat:     Mouth: Mucous membranes are moist.  Cardiovascular:     Rate and Rhythm: Regular rhythm. Tachycardia present.     Heart sounds: Normal heart sounds.     Comments: +JVD Pulmonary:     Effort: Pulmonary effort is normal. No respiratory distress.     Breath sounds: Normal breath sounds.  Abdominal:     General: There is distension.  Musculoskeletal:        General: Tenderness present.     Cervical back: Neck supple.     Right lower leg: Edema present.     Left lower leg: Edema present.   Skin:    General: Skin is warm and dry.     Findings: No erythema or rash.  Neurological:     Mental Status: She is alert and oriented to person, place, and time.     Sensory: No sensory deficit.  Psychiatric:        Behavior: Behavior normal.     ED Results / Procedures / Treatments   Labs (all labs ordered are listed, but only abnormal results are displayed) Labs Reviewed  CBC WITH DIFFERENTIAL/PLATELET - Abnormal; Notable for the following components:      Result Value   RBC 3.84 (*)    Hemoglobin 11.4 (*)    HCT 35.7 (*)    All other components within normal limits  COMPREHENSIVE METABOLIC PANEL - Abnormal; Notable for  the following components:   Sodium 128 (*)    Chloride 96 (*)    Glucose, Bld 598 (*)    Calcium 8.7 (*)    Albumin 2.6 (*)    AST 78 (*)    ALT 89 (*)    All other components within normal limits  LIPASE, BLOOD - Abnormal; Notable for the following components:   Lipase 53 (*)    All other components within normal limits  URINALYSIS, ROUTINE W REFLEX MICROSCOPIC - Abnormal; Notable for the following components:   Color, Urine STRAW (*)    Glucose, UA >=500 (*)    Hgb urine dipstick SMALL (*)    Protein, ur 100 (*)    All other components within normal limits  BLOOD GAS, VENOUS - Abnormal; Notable for the following components:   pO2, Ven <31.0 (*)    Acid-Base Excess 2.2 (*)    All other components within normal limits  CBG MONITORING, ED - Abnormal; Notable for the following components:   Glucose-Capillary 536 (*)    All other components within normal limits  RESPIRATORY PANEL BY RT PCR (FLU A&B, COVID)  BETA-HYDROXYBUTYRIC ACID  BRAIN NATRIURETIC PEPTIDE  OSMOLALITY  PREGNANCY, URINE  POC URINE PREG, ED    EKG None  Radiology DG Chest Port 1 View  Result Date: 10/18/2020 CLINICAL DATA:  Shortness of breath. EXAM: PORTABLE CHEST 1 VIEW COMPARISON:  09/29/2020. FINDINGS: The heart size and mediastinal contours are within normal limits.  Both lungs are clear. No visible pleural effusions or pneumothorax. The visualized skeletal structures are unremarkable. IMPRESSION: No active disease. Electronically Signed   By: Margaretha Sheffield MD   On: 10/18/2020 13:07    Procedures Procedures (including critical care time)  Medications Ordered in ED Medications  insulin regular, human (MYXREDLIN) 100 units/ 100 mL infusion (has no administration in time range)  dextrose 50 % solution 0-50 mL (has no administration in time range)    ED Course  I have reviewed the triage vital signs and the nursing notes.  Pertinent labs & imaging results that were available during my care of the patient were reviewed by me and considered in my medical decision making (see chart for details).  Clinical Course as of Oct 18 1401  Tue Oct 18, 6266  5836 24 year old female sent by endocrinology for hyperglycemia and edema, not improving outpatient despite increasing her lasix from 20 to 40.  On exam, appears uncomfortable, requests not to have her feet assessed for pitting edema due to pain in her feet, legs, general body swelling/pain. Reports SHOB at times without report of CP.    [LM]  1325 CMP with glucose of 598, not acidotic, does have hyponatremia with Na 128, mildly elevated LFTs with albumin of 2.6. CBC without significant change from prior. Beta-hydroxybuteris WNL, VBG with pH 7.322 CXR normal- no cardiomegaly or volume overload.  Discussed with Dr. Roderic Palau, ER attending, plan is for insulin drip to manage her hyperglycemia and admit to medicine for further management.    [LM]  1740 Case discussed with Dr. Roderic Palau with hospitalist service who will consult for admission.    [LM]    Clinical Course User Index [LM] Roque Lias   MDM Rules/Calculators/A&P                          Final Clinical Impression(s) / ED Diagnoses Final diagnoses:  Hyperglycemia due to type 1 diabetes mellitus ([place])  Generalized  edema  Hyponatremia     Rx / DC Orders ED Discharge Orders    None       Tacy Learn, PA-C 10/18/20 1402    Milton Ferguson, MD 10/21/20 [phone]

## 2020-10-18 NOTE — H&P (Addendum)
History and Physical    Diamond [name]:[id number] DOB: 04/25/1996 DOA: 10/18/2020  PCP: [name], PA-C  Patient coming from: home  I have personally briefly reviewed patient's old medical records in [place]  Chief Complaint: swelling, shortness of breath  HPI: Diamond [name] is a 24 y.o. female with medical history significant of uncontrolled type 1 diabetes, who has developed lower extremity edema for the past 3 months.  The patient was recently started on Lasix without significant benefit of her edema.  She has frequent visits to the emergency room for uncontrolled diabetes and hyperglycemia.  She was at her endocrinology office today when she was noted to be hyperglycemic and had significant edema and therefore was sent to the emergency room for evaluation.  She reports a cough for the past 2 weeks, but has not had any fever.  She does report shortness of breath and dyspnea on exertion.  She has had nausea, but no vomiting.  Bowel movements have been normal.  She feels that her abdomen is distended.  She has noticed worsening edema in her lower extremities and reports that her weight is up approximately 11 pounds in the past 5 days.  ED Course: She is noted to be severely hyperglycemic with a blood sugar of 598 in the emergency room.  She was started on IV insulin infusion.  She does not have any evidence of acidosis at this time.  Chest x-ray did not show any acute findings.  Albumin is low at 2.6, transaminases are mildly elevated with AST at 78 and ALT at 89.  Bilirubin is normal.  BNP is 83.  She has been referred for admission.  Review of Systems:  Review of Systems  Constitutional: Negative for fever.  HENT: Negative for congestion and sore throat.   Eyes: Negative for blurred vision and double vision.  Respiratory: Positive for cough and shortness of breath.   Cardiovascular: Positive for leg swelling. Negative for chest pain and palpitations.   Gastrointestinal: Positive for nausea. Negative for diarrhea and vomiting.  Genitourinary: Positive for dysuria.  Musculoskeletal: Positive for myalgias.  Neurological: Negative for dizziness and loss of consciousness.  Psychiatric/Behavioral: Positive for depression.      Past Medical History:  Diagnosis Date  . ADHD (attention deficit hyperactivity disorder) 2007  . Allergy   . Asthma   . Bipolar disorder ([place])   . Crohn's colitis ([place])    Dx Dr. Cherrie Gauze  . Depression    early childhood, trial of seroquel?  . Diabetic neuropathy ([place])   . GERD (gastroesophageal reflux disease)   . Hypertension   . Pancreatitis   . Panic attacks   . PTSD (post-traumatic stress disorder)   . Stroke ([place]) 07/2020   R side  . Type 1 diabetes ([place]) 2008    Past Surgical History:  Procedure Laterality Date  . ADENOIDECTOMY    . HERNIA REPAIR    . TONSILLECTOMY      Social History:  reports that she quit smoking about 11 years ago. Her smoking use included cigarettes. She has never used smokeless tobacco. She reports that she does not drink alcohol and does not use drugs.  Allergies  Allergen Reactions  . Amoxicillin Hives    Did it involve swelling of the face/tongue/throat, SOB, or low BP? no  Did it involve sudden or severe rash/hives, skin peeling, or any reaction on the inside of your mouth or nose? no Did you need to seek medical attention at  a hospital or doctor's office? no When did it last happen?unk If all above answers are "NO", may proceed with cephalosporin use.  Marland Kitchen Penicillins Hives and Itching    Did it involve swelling of the face/tongue/throat, SOB, or low BP? no  Did it involve sudden or severe rash/hives, skin peeling, or any reaction on the inside of your mouth or nose? no Did you need to seek medical attention at a hospital or doctor's office? no When did it last happen?unk If all above answers are "NO", may proceed with cephalosporin  use. Did it involve swelling of the face/tongue/throat, SOB, or low BP? no  Did it involve sudden or severe rash/hives, skin peeling, or any reaction on the inside of your mouth or nose? no Did you need to seek medical attention at a hospital or doctor's office? no When did it last happen?unk If all above answers are "NO", may proceed with cephalosporin use. Tolerated keflex May 2021 with no issue.   . Augmentin [Amoxicillin-Pot Clavulanate] Other (See Comments)    Reaction unknown  . Ceftriaxone Sodium In Dextrose   . Clavulanic Acid   . Adhesive [Tape] Itching and Rash  . Latex Itching and Rash  . Other Itching and Rash  . Pineapple Rash    Rash on tongue and throat    Family History  Problem Relation Age of Onset  . Hypertension Mother   . Diabetes Mother   . Heart disease Mother   . Crohn's disease Father   . Alcohol abuse Father   . Drug abuse Father   . Mental illness Father   . Cancer Father   . Hypertension Maternal Aunt   . Seizures Maternal Aunt   . Depression Maternal Aunt   . Asthma Maternal Uncle   . Hypertension Maternal Uncle   . Cancer Maternal Grandmother   . Diabetes Maternal Grandmother   . Mental illness Maternal Grandfather      Prior to Admission medications   Medication Sig Start Date End Date Taking? Authorizing Provider  albuterol (PROVENTIL HFA;VENTOLIN HFA) 108 (90 BASE) MCG/ACT inhaler Inhale 2 puffs into the lungs every 6 (six) hours as needed. For shortness of breath 07/08/12  Yes Clovis Cao, MD  citalopram (CELEXA) 10 MG tablet Take 10 mg by mouth daily.   Yes [provider]  furosemide (LASIX) 20 MG tablet Take 2 tablets (40 mg total) by mouth daily. 10/12/20  Yes Nida, Marella Chimes, MD  gabapentin (NEURONTIN) 100 MG capsule Take 1 capsule (100 mg total) by mouth daily. 10/13/20  Yes [name], PA-C  Insulin Glargine (BASAGLAR KWIKPEN [place]) Inject 40 Units into the skin at bedtime.   Yes [provider]   insulin lispro (HUMALOG) 100 UNIT/ML injection Inject 10-16 Units into the skin 3 (three) times daily before meals.   Yes [provider]  lisinopril (ZESTRIL) 10 MG tablet Take 1 tablet (10 mg total) by mouth daily. 10/11/20  Yes [name], PA-C  traZODone (DESYREL) 50 MG tablet Take 50 mg by mouth at bedtime.   Yes [provider]    Physical Exam: Vitals:   10/18/20 1400 10/18/20 1430 10/18/20 1530 10/18/20 1600  BP: (!) 149/107 (!) 137/115 (!) 137/97 (!) 129/96  Pulse: (!) 109 (!) 116 (!) 110 (!) 110  Resp: 12 18 14 20   Temp:      TempSrc:      SpO2: 97% 100% 100% 100%  Weight:      Height:  Constitutional: NAD, calm, comfortable Eyes: PERRL, lids and conjunctivae normal ENMT: Mucous membranes are moist. Posterior pharynx clear of any exudate or lesions.Normal dentition.  Neck: normal, supple, no masses, no thyromegaly Respiratory: clear to auscultation bilaterally, no wheezing, no crackles. Normal respiratory effort. No accessory muscle use.  Cardiovascular: Regular rate and rhythm, no murmurs / rubs / gallops. No extremity edema. 2+ pedal pulses. No carotid bruits.  Abdomen: soft, distended, no tenderness, no masses palpated. No hepatosplenomegaly. Bowel sounds positive.  Musculoskeletal: 1-2+ pitting edema in lower extremities Skin: no rashes, lesions, ulcers. No induration Neurologic: CN 2-12 grossly intact. Sensation intact, DTR normal. Strength 5/5 in all 4.  Psychiatric: Normal judgment and insight. Alert and oriented x 3. Normal mood.    Labs on Admission: I have personally reviewed following labs and imaging studies  CBC: Recent Labs  Lab 10/18/20 1212  WBC 5.0  NEUTROABS 3.3  HGB 11.4*  HCT 35.7*  MCV 93.0  PLT 063   Basic Metabolic Panel: Recent Labs  Lab 10/18/20 1212  NA 128*  K 4.4  CL 96*  CO2 26  GLUCOSE 598*  BUN 14  CREATININE 0.83  CALCIUM 8.7*   GFR: Estimated Creatinine Clearance: 83.2 mL/min (by C-G  formula based on SCr of 0.83 mg/dL). Liver Function Tests: Recent Labs  Lab 10/18/20 1212  AST 78*  ALT 89*  ALKPHOS 126  BILITOT 0.7  PROT 7.2  ALBUMIN 2.6*   Recent Labs  Lab 10/18/20 1212  LIPASE 53*   No results for input(s): AMMONIA in the last 168 hours. Coagulation Profile: No results for input(s): INR, PROTIME in the last 168 hours. Cardiac Enzymes: No results for input(s): CKTOTAL, CKMB, CKMBINDEX, TROPONINI in the last 168 hours. BNP (last 3 results) No results for input(s): PROBNP in the last 8760 hours. HbA1C: No results for input(s): HGBA1C in the last 72 hours. CBG: Recent Labs  Lab 10/18/20 1122 10/18/20 1416 10/18/20 1456 10/18/20 1559 10/18/20 1628  GLUCAP 536* 418* 392* 275* 240*   Lipid Profile: No results for input(s): CHOL, HDL, LDLCALC, TRIG, CHOLHDL, LDLDIRECT in the last 72 hours. Thyroid Function Tests: No results for input(s): TSH, T4TOTAL, FREET4, T3FREE, THYROIDAB in the last 72 hours. Anemia Panel: No results for input(s): VITAMINB12, FOLATE, FERRITIN, TIBC, IRON, RETICCTPCT in the last 72 hours. Urine analysis:    Component Value Date/Time   COLORURINE STRAW (A) 10/18/2020 1202   APPEARANCEUR CLEAR 10/18/2020 1202   LABSPEC 1.021 10/18/2020 1202   PHURINE 7.0 10/18/2020 1202   GLUCOSEU >=500 (A) 10/18/2020 1202   HGBUR SMALL (A) 10/18/2020 1202   BILIRUBINUR NEGATIVE 10/18/2020 [place] 10/18/2020 1202   PROTEINUR 100 (A) 10/18/2020 1202   UROBILINOGEN 0.2 09/13/2012 2014   NITRITE NEGATIVE 10/18/2020 1202   LEUKOCYTESUR NEGATIVE 10/18/2020 1202    Radiological Exams on Admission: DG Chest Port 1 View  Result Date: 10/18/2020 CLINICAL DATA:  Shortness of breath. EXAM: PORTABLE CHEST 1 VIEW COMPARISON:  09/29/2020. FINDINGS: The heart size and mediastinal contours are within normal limits. Both lungs are clear. No visible pleural effusions or pneumothorax. The visualized skeletal structures are unremarkable.  IMPRESSION: No active disease. Electronically Signed   By: Margaretha Sheffield MD   On: 10/18/2020 13:07    EKG: Independently reviewed.sinus tachycardia without ischemic changes  Assessment/Plan Active Problems:   Uncontrolled type 1 diabetes mellitus with hyperglycemia (HCC)   Hyperglycemia   Anasarca   Hypoalbuminemia     Uncontrolled type 1 diabetes with hyperglycemia -  No evidence of DKA at this time. -Due to degree of hyperglycemia, she was started on insulin infusion -Once blood sugars are back in range, will transition to basal/bolus insulin.  Anasarca/volume overload -Suspect this related to hypoalbuminemia -Etiology is not entirely clear -Check spot urine protein/creatinine ratio, 24-hour urine protein, echocardiogram -Start on IV Lasix  -Monitor intake and output as well as daily weights  Elevated liver enzymes -Check hepatitis panel and abdominal ultrasound  Hyponatremia -Pseudohyponatremia in the setting of hyperglycemia -Anticipate this should improve as blood sugars correct  Urinary retention -Patient had indwelling Foley catheter up until approximately 10 days ago -She reports that she is able to pass urine, although it is not clear that she is entirely emptying her bladder -Check renal ultrasound  DVT prophylaxis: lovenox  Code Status: full code  Family Communication: updated patient's aunt over the phone. She has requested that her aunt be her main decision maker  Disposition Plan: discharge home once volume status has improved  Consults called:   Admission status: inpatient, stepdown   Kathie Dike MD [facility]   If 7PM-7AM, please contact night-coverage [email or web address]   10/18/2020, 4:48 PM

## 2020-10-18 NOTE — Progress Notes (Signed)
Pt has gained 11lbs in six days.

## 2020-10-18 NOTE — ED Notes (Signed)
Date and time results received: 10/18/20 1239  Test: Glucos Critical Value: 598  Name of Provider Notified: [place]  Orders Received? Or Actions Taken?: Orders

## 2020-10-18 NOTE — ED Notes (Signed)
Date and time results received: 10/18/20  (use smartphrase ".now" to insert current time)  Test: VBG Critical Value: PO2 <31  Name of Provider Notified: Mickel Baas, [place]  Orders Received? Or Actions Taken?:

## 2020-10-19 ENCOUNTER — Inpatient Hospital Stay (HOSPITAL_COMMUNITY): Payer: Self-pay

## 2020-10-19 ENCOUNTER — Ambulatory Visit: Payer: Medicaid Other | Admitting: "Endocrinology

## 2020-10-19 DIAGNOSIS — R609 Edema, unspecified: Secondary | ICD-10-CM | POA: Diagnosis present

## 2020-10-19 DIAGNOSIS — E43 Unspecified severe protein-calorie malnutrition: Secondary | ICD-10-CM

## 2020-10-19 DIAGNOSIS — R0602 Shortness of breath: Secondary | ICD-10-CM

## 2020-10-19 LAB — ECHOCARDIOGRAM COMPLETE
Area-P 1/2: 4.31 cm2
Height: 59 in
S' Lateral: 1.98 cm
Weight: 2098.78 oz

## 2020-10-19 LAB — BASIC METABOLIC PANEL
Anion gap: 8 (ref 5–15)
Anion gap: 5 (ref 5–15)
Anion gap: 7 (ref 5–15)
Anion gap: 7 (ref 5–15)
BUN: 16 mg/dL (ref 6–20)
BUN: 16 mg/dL (ref 6–20)
BUN: 15 mg/dL (ref 6–20)
BUN: 15 mg/dL (ref 6–20)
CO2: 26 mmol/L (ref 22–32)
CO2: 29 mmol/L (ref 22–32)
CO2: 30 mmol/L (ref 22–32)
CO2: 30 mmol/L (ref 22–32)
Calcium: 8.5 mg/dL — ABNORMAL LOW (ref 8.9–10.3)
Calcium: 8.4 mg/dL — ABNORMAL LOW (ref 8.9–10.3)
Calcium: 8.5 mg/dL — ABNORMAL LOW (ref 8.9–10.3)
Calcium: 8.7 mg/dL — ABNORMAL LOW (ref 8.9–10.3)
Chloride: 100 mmol/L (ref 98–111)
Chloride: 100 mmol/L (ref 98–111)
Chloride: 99 mmol/L (ref 98–111)
Chloride: 98 mmol/L (ref 98–111)
Creatinine, Ser: 0.87 mg/dL (ref 0.44–1.00)
Creatinine, Ser: 0.84 mg/dL (ref 0.44–1.00)
Creatinine, Ser: 0.83 mg/dL (ref 0.44–1.00)
Creatinine, Ser: 0.8 mg/dL (ref 0.44–1.00)
GFR, Estimated: >60 mL/min (ref >60)
GFR, Estimated: >60 mL/min (ref >60)
GFR, Estimated: >60 mL/min (ref >60)
GFR, Estimated: >60 mL/min (ref >60)
Glucose, Bld: 182 mg/dL — ABNORMAL HIGH (ref 70–99)
Glucose, Bld: 130 mg/dL — ABNORMAL HIGH (ref 70–99)
Glucose, Bld: 177 mg/dL — ABNORMAL HIGH (ref 70–99)
Glucose, Bld: 269 mg/dL — ABNORMAL HIGH (ref 70–99)
Potassium: 4.3 mmol/L (ref 3.5–5.1)
Potassium: 4.1 mmol/L (ref 3.5–5.1)
Potassium: 4 mmol/L (ref 3.5–5.1)
Potassium: 4.3 mmol/L (ref 3.5–5.1)
Sodium: 134 mmol/L — ABNORMAL LOW (ref 135–145)
Sodium: 134 mmol/L — ABNORMAL LOW (ref 135–145)
Sodium: 136 mmol/L (ref 135–145)
Sodium: 135 mmol/L (ref 135–145)

## 2020-10-19 LAB — MRSA PCR SCREENING: MRSA by PCR: NEGATIVE

## 2020-10-19 LAB — GLUCOSE, CAPILLARY
Glucose-Capillary: 111 mg/dL — ABNORMAL HIGH (ref 70–99)
Glucose-Capillary: 176 mg/dL — ABNORMAL HIGH (ref 70–99)
Glucose-Capillary: 241 mg/dL — ABNORMAL HIGH (ref 70–99)
Glucose-Capillary: 303 mg/dL — ABNORMAL HIGH (ref 70–99)

## 2020-10-19 LAB — HIV ANTIBODY (ROUTINE TESTING W REFLEX): HIV Screen 4th Generation wRfx: NONREACTIVE

## 2020-10-19 MED ORDER — GLUCERNA SHAKE PO LIQD
237.0000 mL | Freq: Two times a day (BID) | ORAL | Status: DC
  Administered 2020-10-19 – 2020-10-20 (×3): 237 mL via ORAL

## 2020-10-19 MED ORDER — ONDANSETRON HCL 4 MG/2ML IJ SOLN
4.0000 mg | Freq: Four times a day (QID) | INTRAMUSCULAR | Status: DC | PRN
  Administered 2020-10-19 – 2020-10-21 (×5): 4 mg via INTRAVENOUS
  Filled 2020-10-19 (×5): qty 2

## 2020-10-19 MED ORDER — POLYETHYLENE GLYCOL 3350 17 G PO PACK
17.0000 g | PACK | Freq: Every day | ORAL | Status: DC
  Administered 2020-10-19 – 2020-10-21 (×2): 17 g via ORAL
  Filled 2020-10-19 (×4): qty 1

## 2020-10-19 MED ORDER — ACETAMINOPHEN 325 MG PO TABS
650.0000 mg | ORAL_TABLET | ORAL | Status: DC | PRN
  Administered 2020-10-19 – 2020-10-21 (×6): 650 mg via ORAL
  Filled 2020-10-19 (×6): qty 2

## 2020-10-19 MED ORDER — SENNOSIDES-DOCUSATE SODIUM 8.6-50 MG PO TABS
1.0000 | ORAL_TABLET | Freq: Every evening | ORAL | Status: DC | PRN
  Administered 2020-10-19: 1 via ORAL
  Filled 2020-10-19: qty 1

## 2020-10-19 MED ORDER — ADULT MULTIVITAMIN W/MINERALS CH
1.0000 | ORAL_TABLET | Freq: Every day | ORAL | Status: DC
  Administered 2020-10-19 – 2020-10-21 (×3): 1 via ORAL
  Filled 2020-10-19 (×3): qty 1

## 2020-10-19 NOTE — Progress Notes (Signed)
Patient Demographics:    Diamond [name], is a 24 y.o. female, DOB - 08/31/1996, OQH:[id number]  Admit date - 10/18/2020   Admitting Physician Kathie Dike, MD  Outpatient Primary MD for the patient is [name], PA-C  LOS - 1  Chief Complaint  Patient presents with  . Abdominal Pain        Subjective:    Diamond [name] today has no fevers, no emesis,  No chest pain,   Voiding well Edema persists Appetite is Not great , some Nausea persist  Assessment  & Plan :    Active Problems:   Uncontrolled type 1 diabetes mellitus with hyperglycemia (HCC)   Hyperglycemia   Anasarca   Hypoalbuminemia   Edema  Brief Summary:- 24 y.o. female with medical history significant of uncontrolled type 1 diabetes, who has developed lower extremity edema and anasarca in the setting of low albumin, admitted on 10/18/2020 after failing outpatient oral diuretics  A/p 1)Dm1--uncontrolled DM1 with hyperglycemia,-recent A1c greater than 15.5 -- diagnosed at age 19 -No evidence of DKA at this time -Continue subcu insulin and dietary recommendations  2)Hypoalbulminemia with Lower Extremity Edema/Anasarca/volume overload-patient ---dietitian consult appreciated albumin is 2.6 , protein creatinine ratio is high at 3.95 -UA with 3+ proteinuria -24-hour urine protein to rule out nephrotic syndrome pending -Glucerna supplements and lean protein recommended,  -Echo with EF of 65 to 70%, without wall motion abnormalities or diastolic dysfunction, BNP is only 83 -Continue diuresis with IV Lasix -Apply TED stockings  3)Elevated LFTs--  AST 78 and ALT 89, T Bil 0.7 -Viral Hepatitis profile pending hep C appears equivocal  4)Chronic normocytic and normochromic anemia--hemoglobin currently above 11 which is at or above patient's recent baseline -No evidence of acute bleeding at this time  5)Social/Ethics--history of  poor compliance, discharge planner/social worker trying to help patient with insurance problems  Disposition/Need for in-Hospital Stay- patient unable to be discharged at this time due to --- edema/anasarca requiring IV Lasix after failing oral diuretics PTA, low albumin requiring 24-hour urine protein to rule out nephrotic syndrome  Status is: Inpatient  Remains inpatient appropriate because:edema/anasarca requiring IV Lasix after failing oral diuretics PTA, low albumin requiring 24-hour urine protein to rule out nephrotic syndrome   Disposition: The patient is from: Home              Anticipated d/c is to: Home              Anticipated d/c date is: 2 days              Patient currently is not medically stable to d/c. Barriers: Not Clinically Stable- edema/anasarca requiring IV Lasix after failing oral diuretics PTA, low albumin requiring 24-hour urine protein to rule out nephrotic syndrome  Code Status : full   Family Communication:   (patient is alert, awake and coherent)   Consults  :  na  DVT Prophylaxis  :  Lovenox -- SCDs    Lab Results  Component Value Date   PLT 294 10/18/2020    Inpatient Medications  Scheduled Meds: . citalopram  10 mg Oral Daily  . enoxaparin (LOVENOX) injection  40 mg Subcutaneous Q24H  . feeding supplement (GLUCERNA SHAKE)  237 mL Oral BID BM  .  furosemide  40 mg Intravenous BID  . gabapentin  100 mg Oral Daily  . insulin aspart  0-15 Units Subcutaneous TID WC  . insulin glargine  40 Units Subcutaneous Q24H  . lisinopril  10 mg Oral Daily  . multivitamin with minerals  1 tablet Oral Daily  . traZODone  50 mg Oral QHS   Continuous Infusions: PRN Meds:.acetaminophen, dextrose, ondansetron (ZOFRAN) IV    Anti-infectives (From admission, onward)   None        Objective:   Vitals:   10/19/20 0900 10/19/20 1000 10/19/20 1200 10/19/20 1228  BP:    (!) 139/106  Pulse: (!) 117 (!) 112 (!) 108 (!) 112  Resp: 18 11 12 18   Temp:        TempSrc:      SpO2: 100% 100% 93% 99%  Weight:      Height:        Wt Readings from Last 3 Encounters:  10/19/20 59.5 kg  10/18/20 61.4 kg  10/12/20 55.9 kg     Intake/Output Summary (Last 24 hours) at 10/19/2020 1546 Last data filed at 10/19/2020 0900 Gross per 24 hour  Intake 388.48 ml  Output --  Net 388.48 ml    Physical Exam  Gen:- Awake Alert, speaking in complete sentences HEENT:- [place].AT, No sclera icterus Neck-Supple Neck,No JVD,.  Lungs-  CTAB , fair symmetrical air movement CV- S1, S2 normal, regular  Abd-  +ve B.Sounds, Abd Soft, NT, lower abdominal wall edema,    Extremity/Skin:-2-3+ pitting edema, pedal pulses present  Psych-affect is appropriate, oriented x3 Neuro-no new focal deficits, no tremors   Data Review:   Micro Results Recent Results (from the past 240 hour(s))  Respiratory Panel by RT PCR (Flu A&B, Covid) - Nasopharyngeal Swab     Status: None   Collection Time: 10/18/20 12:21 PM   Specimen: Nasopharyngeal Swab  Result Value Ref Range Status   SARS Coronavirus 2 by RT PCR NEGATIVE NEGATIVE Final    Comment: (NOTE) SARS-CoV-2 target nucleic acids are NOT DETECTED.  The SARS-CoV-2 RNA is generally detectable in upper respiratoy specimens during the acute phase of infection. The lowest concentration of SARS-CoV-2 viral copies this assay can detect is 131 copies/mL. A negative result does not preclude SARS-Cov-2 infection and should not be used as the sole basis for treatment or other patient management decisions. A negative result may occur with  improper specimen collection/handling, submission of specimen other than nasopharyngeal swab, presence of viral mutation(s) within the areas targeted by this assay, and inadequate number of viral copies (<131 copies/mL). A negative result must be combined with clinical observations, patient history, and epidemiological information. The expected result is Negative.  Fact Sheet for Patients:   PinkCheek.be  Fact Sheet for Healthcare Providers:  GravelBags.it  This test is no t yet approved or cleared by the [place] FDA and  has been authorized for detection and/or diagnosis of SARS-CoV-2 by FDA under an Emergency Use Authorization (EUA). This EUA will remain  in effect (meaning this test can be used) for the duration of the COVID-19 declaration under Section 564(b)(1) of the Act, 21 U.S.C. section 360bbb-3(b)(1), unless the authorization is terminated or revoked sooner.     Influenza A by PCR NEGATIVE NEGATIVE Final   Influenza B by PCR NEGATIVE NEGATIVE Final    Comment: (NOTE) The Xpert Xpress SARS-CoV-2/FLU/RSV assay is intended as an aid in  the diagnosis of influenza from Nasopharyngeal swab specimens and  should not be used  as a sole basis for treatment. Nasal washings and  aspirates are unacceptable for Xpert Xpress SARS-CoV-2/FLU/RSV  testing.  Fact Sheet for Patients: PinkCheek.be  Fact Sheet for Healthcare Providers: GravelBags.it  This test is not yet approved or cleared by the [place] FDA and  has been authorized for detection and/or diagnosis of SARS-CoV-2 by  FDA under an Emergency Use Authorization (EUA). This EUA will remain  in effect (meaning this test can be used) for the duration of the  Covid-19 declaration under Section 564(b)(1) of the Act, 21  U.S.C. section 360bbb-3(b)(1), unless the authorization is  terminated or revoked. Performed at [facility], [address] [place], [place] [address]   MRSA PCR Screening     Status: None   Collection Time: 10/18/20 10:14 PM   Specimen: Nasal Mucosa; Nasopharyngeal  Result Value Ref Range Status   MRSA by PCR NEGATIVE NEGATIVE Final    Comment:        The GeneXpert MRSA Assay (FDA approved for NASAL specimens only), is one component of a comprehensive MRSA  colonization surveillance program. It is not intended to diagnose MRSA infection nor to guide or monitor treatment for MRSA infections. Performed at [facility], [address] [place],  [address]     Radiology Reports US Abdomen Complete  Result Date: 10/18/2020 CLINICAL DATA:  24 year old female with abdominal distension. EXAM: ABDOMEN ULTRASOUND COMPLETE COMPARISON:  None. FINDINGS: Gallbladder: No gallstones or wall thickening visualized. No sonographic Murphy sign noted by sonographer. Common bile duct: Diameter: 2 mm Liver: No focal lesion identified. Within normal limits in parenchymal echogenicity. Portal vein is patent on color Doppler imaging with normal direction of blood flow towards the liver. IVC: No abnormality visualized. Pancreas: Visualized portion unremarkable. Spleen: Size and appearance within normal limits. Right Kidney: Length: 10.5 cm. Mild increased echogenicity. No hydronephrosis or shadowing stone. Left Kidney: Length: 11.6 cm. Mildly echogenic. No hydronephrosis or shadowing stone. Abdominal aorta: No aneurysm visualized. Other findings: None. IMPRESSION: Mildly echogenic kidneys, nonspecific, may represent a degree of renal insufficiency. Clinical correlation is recommended. No other abnormality identified. Electronically Signed   By: Anner Crete M.D.   On: 10/18/2020 17:14   DG Chest Port 1 View  Result Date: 10/18/2020 CLINICAL DATA:  Shortness of breath. EXAM: PORTABLE CHEST 1 VIEW COMPARISON:  09/29/2020. FINDINGS: The heart size and mediastinal contours are within normal limits. Both lungs are clear. No visible pleural effusions or pneumothorax. The visualized skeletal structures are unremarkable. IMPRESSION: No active disease. Electronically Signed   By: Margaretha Sheffield MD   On: 10/18/2020 13:07   ECHOCARDIOGRAM COMPLETE  Result Date: 10/19/2020    ECHOCARDIOGRAM REPORT   Patient Name:   Diamond [name] Date of Exam: 10/19/2020 Medical Rec #:   [id number]        Height:       59.0 in Accession #:    [id number]       Weight:       131.2 lb Date of Birth:  09/27/1996        BSA:          1.542 m Patient Age:    24 years         BP:           141/91 mmHg Patient Gender: F                HR:           106 bpm. Exam [facility]:  [facility] Procedure: 2D Echo Indications:    Dyspnea 786.09 / R06.00  History:        Patient has no prior history of Echocardiogram examinations.                 Risk Factors:Former Smoker and Diabetes.  Sonographer:    Leavy Cella RDCS (AE) Referring Phys: [place]  1. Left ventricular ejection fraction, by estimation, is 65 to 70%. The left ventricle has normal function. The left ventricle has no regional wall motion abnormalities. There is mild left ventricular hypertrophy. Left ventricular diastolic parameters were normal.  2. Right ventricular systolic function is normal. The right ventricular size is normal. Tricuspid regurgitation signal is inadequate for assessing PA pressure.  3. The mitral valve is grossly normal. Trivial mitral valve regurgitation.  4. The aortic valve is tricuspid. Aortic valve regurgitation is not visualized.  5. The inferior vena cava is normal in size with greater than 50% respiratory variability, suggesting right atrial pressure of 3 mmHg. FINDINGS  Left Ventricle: Left ventricular ejection fraction, by estimation, is 65 to 70%. The left ventricle has normal function. The left ventricle has no regional wall motion abnormalities. The left ventricular internal cavity size was normal in size. There is  mild left ventricular hypertrophy. Left ventricular diastolic parameters were normal. Right Ventricle: The right ventricular size is normal. No increase in right ventricular wall thickness. Right ventricular systolic function is normal. Tricuspid regurgitation signal is inadequate for assessing PA pressure. Left Atrium: Left atrial size was normal in size. Right Atrium: Right atrial  size was normal in size. Pericardium: There is no evidence of pericardial effusion. Mitral Valve: The mitral valve is grossly normal. Trivial mitral valve regurgitation. Tricuspid Valve: The tricuspid valve is grossly normal. Tricuspid valve regurgitation is trivial. Aortic Valve: The aortic valve is tricuspid. Aortic valve regurgitation is not visualized. Pulmonic Valve: The pulmonic valve was grossly normal. Pulmonic valve regurgitation is trivial. Aorta: The aortic root is normal in size and structure. Venous: The inferior vena cava is normal in size with greater than 50% respiratory variability, suggesting right atrial pressure of 3 mmHg. IAS/Shunts: No atrial level shunt detected by color flow Doppler.  LEFT VENTRICLE PLAX 2D LVIDd:         3.04 cm  Diastology LVIDs:         1.98 cm  LV e' medial:    9.25 cm/s LV PW:         1.37 cm  LV E/e' medial:  7.2 LV IVS:        1.02 cm  LV e' lateral:   12.10 cm/s LVOT diam:     1.80 cm  LV E/e' lateral: 5.5 LVOT Area:     2.54 cm  RIGHT VENTRICLE RV S prime:     16.30 cm/s TAPSE (M-mode): 2.5 cm LEFT ATRIUM             Index       RIGHT ATRIUM          Index LA diam:        2.50 cm 1.62 cm/m  RA Area:     9.33 cm LA Vol (A2C):   23.3 ml 15.11 ml/m RA Volume:   19.10 ml 12.39 ml/m LA Vol (A4C):   22.4 ml 14.53 ml/m LA Biplane Vol: 25.0 ml 16.22 ml/m   AORTA Ao Root diam: 1.90 cm MITRAL VALVE MV Area (PHT): 4.31 cm    SHUNTS MV Decel Time: 176  msec    Systemic Diam: 1.80 cm MV E velocity: 66.80 cm/s MV A velocity: 19.70 cm/s MV E/A ratio:  3.39 Rozann Lesches MD Electronically signed by Rozann Lesches MD Signature Date/Time: 10/19/2020/11:13:34 AM    Final      CBC Recent Labs  Lab 10/18/20 1212  WBC 5.0  HGB 11.4*  HCT 35.7*  PLT 294  MCV 93.0  MCH 29.7  MCHC 31.9  RDW 13.2  LYMPHSABS 1.3  MONOABS 0.4  EOSABS 0.0  BASOSABS 0.0    Chemistries  Recent Labs  Lab 10/18/20 1212 10/18/20 1212 10/18/20 2147 10/19/20 0150 10/19/20 0532  10/19/20 0936 10/19/20 1314  NA 128*   < > 135 134* 134* 136 135  K 4.4   < > 4.0 4.3 4.1 4.0 4.3  CL 96*   < > 100 100 100 99 98  CO2 26   < > 27 26 29 30 30   GLUCOSE 598*   < > 173* 182* 130* 177* 269*  BUN 14   < > 13 16 16 15 15   CREATININE 0.83   < > 0.74 0.87 0.84 0.83 0.80  CALCIUM 8.7*   < > 8.8* 8.5* 8.4* 8.5* 8.7*  AST 78*  --   --   --   --   --   --   ALT 89*  --   --   --   --   --   --   ALKPHOS 126  --   --   --   --   --   --   BILITOT 0.7  --   --   --   --   --   --    < > = values in this interval not displayed.   ------------------------------------------------------------------------------------------------------------------ No results for input(s): CHOL, HDL, LDLCALC, TRIG, CHOLHDL, LDLDIRECT in the last 72 hours.  Lab Results  Component Value Date   HGBA1C >15.5 (H) 08/25/2020   ------------------------------------------------------------------------------------------------------------------ No results for input(s): TSH, T4TOTAL, T3FREE, THYROIDAB in the last 72 hours.  Invalid input(s): FREET3 ------------------------------------------------------------------------------------------------------------------ No results for input(s): VITAMINB12, FOLATE, FERRITIN, TIBC, IRON, RETICCTPCT in the last 72 hours.  Coagulation profile No results for input(s): INR, PROTIME in the last 168 hours.  No results for input(s): DDIMER in the last 72 hours.  Cardiac Enzymes No results for input(s): CKMB, TROPONINI, MYOGLOBIN in the last 168 hours.  Invalid input(s): CK ------------------------------------------------------------------------------------------------------------------    Component Value Date/Time   BNP 83.0 10/18/2020 1212     Veona Bittman M.D on 10/19/2020 at 3:46 PM  Go to [email or web address] - for contact info  [facility] - Office  [phone]

## 2020-10-19 NOTE — Progress Notes (Signed)
*  PRELIMINARY RESULTS* Echocardiogram 2D Echocardiogram has been performed.  Leavy Cella 10/19/2020, 10:33 AM

## 2020-10-19 NOTE — TOC Initial Note (Addendum)
Transition of Care [facility]) - Initial/Assessment Note    Patient Details  Name: Diamond [name] MRN: [id number] Date of Birth: 04-26-1996  [facility] of [facility]) CM/SW Contact:    Natasha Bence, LCSW Phone Number: 10/19/2020, 12:00 PM  Clinical Narrative:                 Patient is a 24 year old female admitted for Hypoalbuminemia. During last visit 08/25/2020, patient referred to [facility], referred to Development worker, community and provided [employer]. TOC to follow.         Patient Goals and CMS Choice        Expected Discharge Plan and Services                                                Prior Living Arrangements/Services                       Activities of Daily Living Home Assistive Devices/Equipment: CBG Meter ADL Screening (condition at time of admission) Patient's cognitive ability adequate to safely complete daily activities?: Yes Is the patient deaf or have difficulty hearing?: No Does the patient have difficulty seeing, even when wearing glasses/contacts?: No Does the patient have difficulty concentrating, remembering, or making decisions?: No Patient able to express need for assistance with ADLs?: Yes Does the patient have difficulty dressing or bathing?: No Independently performs ADLs?: Yes (appropriate for developmental age) Does the patient have difficulty walking or climbing stairs?: No Weakness of Legs: None Weakness of Arms/Hands: None  Permission Sought/Granted                  Emotional Assessment              Admission diagnosis:  Abdominal distention [R14.0] Hyponatremia [E87.1] Generalized edema [R60.1] Hyperglycemia [R73.9] Hyperglycemia due to type 1 diabetes mellitus ([place]) [E10.65] Patient Active Problem List   Diagnosis Date Noted  . Anasarca 10/18/2020  . Hypoalbuminemia 10/18/2020  . Personal history of noncompliance with medical treatment, presenting hazards to health 10/12/2020  . Bilateral  lower extremity edema 08/25/2020  . Pneumonia 08/25/2020  . Hyperglycemia 08/25/2020  . Eczema 10/23/2012  . Abdominal pain 08/27/2012  . GERD (gastroesophageal reflux disease) 07/25/2012  . Mood disorder ([place]) 07/09/2012  . Contraception management 07/09/2012  . History of Crohn's disease 07/09/2012  . Uncontrolled type 1 diabetes mellitus with hyperglycemia ([place]) 07/08/2012  . Asthma, persistent controlled 07/08/2012   PCP:  [name], PA-C Pharmacy:   [facility], [place], [place], [place] [address] Phone: [phone] Fax: [phone]     Social Determinants of Health (SDOH) Interventions    Readmission Risk Interventions No flowsheet data found.

## 2020-10-19 NOTE — Progress Notes (Addendum)
Inpatient Diabetes Program Recommendations  AACE/ADA: New Consensus Statement on Inpatient Glycemic Control   Target Ranges:  Prepandial:   less than 140 mg/dL      Peak postprandial:   less than 180 mg/dL (1-2 hours)      Critically ill patients:  140 - 180 mg/dL  Results for LEMOYNE, SCARPATI (MRN [id number]) as of 10/19/2020 10:12  Ref. Range 10/19/2020 08:17  Glucose-Capillary Latest Ref Range: 70 - 99 mg/dL 111 (H)   Results for RASHIA, MCKESSON (MRN [id number]) as of 10/19/2020 10:12  Ref. Range 10/18/2020 11:22 10/18/2020 14:16 10/18/2020 14:56 10/18/2020 15:59 10/18/2020 16:28 10/18/2020 17:02 10/18/2020 18:11 10/18/2020 19:16 10/18/2020 20:16 10/18/2020 21:34 10/18/2020 22:31  Glucose-Capillary Latest Ref Range: 70 - 99 mg/dL 536 (HH) 418 (H) 392 (H) 275 (H) 240 (H) 155 (H) 114 (H) 140 (H) 137 (H)  Lantus 40 units @20 :28 154 (H) 166 (H)  Results for KENYONA, RENA (MRN [id number]) as of 10/19/2020 10:12  Ref. Range 10/18/2020 12:12 10/18/2020 12:13  Beta-Hydroxybutyric Acid Latest Ref Range: 0.05 - 0.27 mmol/L  0.09  Glucose Latest Ref Range: 70 - 99 mg/dL [facility])   Results for AIRABELLA, BARLEY (MRN [id number]) as of 10/19/2020 10:12  Ref. Range 02/03/2020 00:00 08/25/2020 19:41  Hemoglobin A1C Latest Ref Range: 4.8 - 5.6 % 14.0 >15.5 (H)   Review of Glycemic Control  Diabetes history: DM1 Outpatient Diabetes medications: Basaglar 40 units QHS, Humalog 10 units TID with meals, plus Humalog for corrrection Current orders for Inpatient glycemic control: Lantus 40 units Q24H, Novolog 0-15 units TID with meals  Inpatient Diabetes Program Recommendations:    Insulin: Please consider decreasing Novolog to 0-9 units TID with meals, adding Novolog 0-5 units QHS for bedtime correction, and adding Novolog 3 units TID with meals for meal coverage if patient eats at least 50% of meals.  NOTE: Per chart, patient has DM1 and sees Dr. Dorris Fetch (Endocrinologist) and was last seen on 10/18/20  (prior to coming the Emergency Room on 10/18/20). Per office note by Dr. Dorris Fetch on 10/18/20, "she has an aide from integrative medicine who promises to offer her help.  After her ER visit and treatment/discharge, she is advised to increase Basaglar to 40 units nightly, increase Humalog to 10 units 3 times a day with meals  for pre-meal BG readings of 90-138m/dl, plus patient specific correction dose for unexpected hyperglycemia above 1573md."  Patient's initial glucose was 598 mg/dl on 10/18/20 and patient was started on IV insulin for hyperglycemia (not in DKA). Patient received Lantus 40 units at 20:28 on 10/18/20 at time of transition from IV to SQ insulin. Fasting glucose 111 mg/dl this morning.   Addendum 10/19/20@13 :40-Spoke with patient over the phone regarding DM control. Patient states that she does not have any insurance and she has to pay for insulins. Patient states that she has med assist but her insulins were costing her $300 but she was recently able to use a coupon and get the cost down to $75. [facility] and they do not seem to work as her glucose remains very elevated with them. Patient states that she does not have much insulin left at home and would like to get a prescription for Basaglar and Humalog insulin pens at time of discharge.  Patient states that she has all needed testing supplies at home. Inquired about integrative medicine aide  and patient states that the aide goes with her to appointments and helps her with DM management as well if needed. Encouraged patient to seek help from the aide to get DM under better control. Discussed importance of glycemic control and explained how hyperglycemia leads to complications. Reviewed A1C of >15.5% on 08/25/20 and stressed importance of getting A1C down to goal of 7% or less especially given she is 24 years old to help  decrease risk of complications from uncontrolled DM. Encouraged patient to be sure to monitor glucose as Dr. Dorris Fetch has instructed and to make changes with Basaglar and Humalog as he instructed her at her office visit on 10/18/20. Explained why glucose monitoring is so important in helping Dr. Dorris Fetch to continue to make adjustments with DM medications if needed. Patient verbalized understanding of information discussed and she states that she has no questions at this time related to DM. At time of discharge, please provide Rx for: Basaglar insulin pens [phone]) and Humalog Kwikpen [phone]).  Thanks, Barnie Alderman, RN, MSN, CDE Diabetes Coordinator Inpatient Diabetes Program [phone] (Team Pager from 8am to 5pm)

## 2020-10-19 NOTE — Plan of Care (Signed)
  RD consulted for nutrition education regarding diabetes.   Lab Results  Component Value Date   HGBA1C >15.5 (H) 08/25/2020  Met with pt at bedside this afternoon. She reports having a headache and feeling nauseas. Pt endorses poor po of lunch today, says she did not like the spaghetti or mashed potatoes. Dietary called pt room during visit, pt ordered grilled chicken salad with ranch and peaches for dinner. Pt reports she was diagnosed with type 1 DM at age 24, she is able to convey an understanding of diabetes, A1c, and the importance of nutrition in diabetes management. Pt lives with her aunt and cousins, reports her mother passed in 2013 and lost her father to colon cancer last year. She helps take care of her cousins and recalls meal intakes have been inconsistent over the last year. Patient wakes up at 6 to help the kids get ready for school, eats an apple and takes medications around 7:15, then goes back to bed until noon. She cooks lunch around 4 pm, recalls chicken, brown rice, and vegetable, eats dinner at 10 pm which is usually a can of ravioli and drinks water, cranberry juice, and grapefruit juice.   RD provided "Carbohydrate Counting for People with Diabetes" handout from the Academy of Nutrition and Dietetics as well as "Diabetes Plate Method" from ADA. Discussed different food groups and their effects on blood sugar, emphasizing carbohydrate-containing foods. Provided list of carbohydrates and recommended serving sizes of common foods. Discussed importance of controlled and consistent carbohydrate intake throughout the day. Provided examples of ways to balance meals/snacks, encouraged lean proteins, healthy fats, and intake of high-fiber, whole grain complex carbohydrates. Encouraged pt to visit ADA web page for easy to prepare meal/snack ideas and provided Glucerna Shake coupons. Teach back method used.  Expect good compliance.  Body mass index is 26.49 kg/m. Pt meets criteria for  overweight based on current BMI.  Current diet order is CM, patient is consuming approximately 50% of meals at this time. Will order Glucerna Shake BID as well as daily MVI. Labs and medications reviewed. No further nutrition interventions warranted at this time. RD contact information provided. If additional nutrition issues arise, please re-consult RD.  Lajuan Lines, RD, LDN Clinical Nutrition After Hours/Weekend Pager # in [place]

## 2020-10-19 NOTE — Progress Notes (Signed)
Report given to [facility] on 300. Pt transferring to floor via wheelchair.

## 2020-10-20 DIAGNOSIS — R739 Hyperglycemia, unspecified: Secondary | ICD-10-CM

## 2020-10-20 LAB — GLUCOSE, CAPILLARY
Glucose-Capillary: 206 mg/dL — ABNORMAL HIGH (ref 70–99)
Glucose-Capillary: 189 mg/dL — ABNORMAL HIGH (ref 70–99)
Glucose-Capillary: 214 mg/dL — ABNORMAL HIGH (ref 70–99)
Glucose-Capillary: 457 mg/dL — ABNORMAL HIGH (ref 70–99)
Glucose-Capillary: 121 mg/dL — ABNORMAL HIGH (ref 70–99)

## 2020-10-20 LAB — HEPATITIS PANEL, ACUTE
HCV Ab: 0.1 s/co ratio — AB (ref 0.0–0.9)
Hep A IgM: NEGATIVE — AB
Hep B C IgM: NEGATIVE — AB
Hepatitis B Surface Ag: NEGATIVE — AB

## 2020-10-20 LAB — RENAL FUNCTION PANEL
Albumin: 2.2 g/dL — ABNORMAL LOW (ref 3.5–5.0)
Anion gap: 5 (ref 5–15)
BUN: 19 mg/dL (ref 6–20)
CO2: 30 mmol/L (ref 22–32)
Calcium: 8.7 mg/dL — ABNORMAL LOW (ref 8.9–10.3)
Chloride: 101 mmol/L (ref 98–111)
Creatinine, Ser: 0.87 mg/dL (ref 0.44–1.00)
GFR, Estimated: >60 mL/min (ref >60)
Glucose, Bld: 230 mg/dL — ABNORMAL HIGH (ref 70–99)
Phosphorus: 6.2 mg/dL — ABNORMAL HIGH (ref 2.5–4.6)
Potassium: 4.3 mmol/L (ref 3.5–5.1)
Sodium: 136 mmol/L (ref 135–145)

## 2020-10-20 LAB — CREATININE, URINE, 24 HOUR
Collection Interval-UCRE24: 24 hours
Creatinine, 24H Ur: 833 mg/d (ref 600–1800)
Creatinine, Urine: 21.35 mg/dL
Urine Total Volume-UCRE24: 3900 mL

## 2020-10-20 MED ORDER — INSULIN ASPART 100 UNIT/ML ~~LOC~~ SOLN
3.0000 [IU] | Freq: Three times a day (TID) | SUBCUTANEOUS | Status: DC
  Administered 2020-10-20: 3 [IU] via SUBCUTANEOUS

## 2020-10-20 MED ORDER — INSULIN ASPART 100 UNIT/ML ~~LOC~~ SOLN
0.0000 [IU] | Freq: Three times a day (TID) | SUBCUTANEOUS | Status: DC
  Administered 2020-10-20: 3 [IU] via SUBCUTANEOUS

## 2020-10-20 MED ORDER — INSULIN ASPART 100 UNIT/ML ~~LOC~~ SOLN
15.0000 [IU] | Freq: Once | SUBCUTANEOUS | Status: AC
  Administered 2020-10-20: 15 [IU] via SUBCUTANEOUS

## 2020-10-20 MED ORDER — PROMETHAZINE HCL 25 MG/ML IJ SOLN
25.0000 mg | Freq: Once | INTRAMUSCULAR | Status: AC
  Administered 2020-10-20: 25 mg via INTRAVENOUS
  Filled 2020-10-20: qty 1

## 2020-10-20 MED ORDER — INSULIN ASPART 100 UNIT/ML ~~LOC~~ SOLN
0.0000 [IU] | Freq: Three times a day (TID) | SUBCUTANEOUS | Status: DC
  Administered 2020-10-21: 11 [IU] via SUBCUTANEOUS

## 2020-10-20 MED ORDER — INSULIN ASPART 100 UNIT/ML ~~LOC~~ SOLN
4.0000 [IU] | Freq: Three times a day (TID) | SUBCUTANEOUS | Status: DC
  Administered 2020-10-21 (×2): 4 [IU] via SUBCUTANEOUS

## 2020-10-20 MED ORDER — INSULIN ASPART 100 UNIT/ML ~~LOC~~ SOLN: 0.0000 [IU] | Freq: Every day | SUBCUTANEOUS | Status: DC

## 2020-10-20 MED ORDER — INSULIN GLARGINE 100 UNIT/ML ~~LOC~~ SOLN
42.0000 [IU] | SUBCUTANEOUS | Status: DC
  Administered 2020-10-20: 42 [IU] via SUBCUTANEOUS
  Filled 2020-10-20 (×4): qty 0.42

## 2020-10-20 NOTE — Progress Notes (Signed)
Patient Demographics:    Diamond [name], is a 24 y.o. female, DOB - 04-01-1996, GYK:[id number]  Admit date - 10/18/2020   Admitting Physician Diamond Dike, MD  Outpatient Primary MD for the patient is Diamond [name]  LOS - 2  Chief Complaint  Patient presents with  . Abdominal Pain        Subjective:    Diamond [name] today has no fevers,   No chest pain,    -Continues to have persistent emesis despite Zofran -Required  Phenergan -Patient is voiding well with diuretics -24-hour urine collection in progress  Assessment  & Plan :    Active Problems:   Uncontrolled type 1 diabetes mellitus with hyperglycemia (HCC)   Hyperglycemia   Anasarca   Hypoalbuminemia   Edema  Brief Summary:- 24 y.o. female with medical history significant of uncontrolled type 1 diabetes, who has developed lower extremity edema and anasarca in the setting of low albumin, admitted on 10/18/2020 after failing outpatient oral diuretics  A/p 1)Dm1--uncontrolled DM1 with hyperglycemia,-recent A1c greater than 15.5 -- diagnosed at age 35 -No evidence of DKA at this time -Diabetic educator consult appreciated -Lantus insulin increased to 42 units nightly, sliding scale coverage adjusted  2)Hypoalbulminemia with Lower Extremity Edema/Anasarca/volume overload-patient ---dietitian consult appreciated albumin is 2.6 , protein creatinine ratio is high at 3.95 -UA with 3+ proteinuria -24-hour urine protein to rule out nephrotic syndrome in progress -Glucerna supplements and lean protein recommended,  -Echo with EF of 65 to 70%, without wall motion abnormalities or diastolic dysfunction, BNP is only 83 -Continue diuresis with IV Lasix---weight is currently largely unchanged -Continue TED stockings  3)Elevated LFTs--  AST 78 and ALT 89, T Bil 0.7 -Viral Hepatitis profile pending hep C appears equivocal -Hep A  negative, hep B pending  4)Chronic normocytic and normochromic anemia--hemoglobin currently above 11 which is at or above patient's recent baseline -No evidence of acute bleeding at this time  5)Social/Ethics--history of poor compliance, discharge planner/social worker trying to help patient with insurance problems  Disposition/Need for in-Hospital Stay- patient unable to be discharged at this time due to --- edema/anasarca requiring IV Lasix after failing oral diuretics PTA, low albumin requiring 24-hour urine protein to rule out nephrotic syndrome  Status is: Inpatient  Remains inpatient appropriate because:edema/anasarca requiring IV Lasix after failing oral diuretics PTA, low albumin requiring 24-hour urine protein to rule out nephrotic syndrome   Disposition: The patient is from: Home              Anticipated d/c is to: Home              Anticipated d/c date is: 2 days              Patient currently is not medically stable to d/c. Barriers: Not Clinically Stable- edema/anasarca requiring IV Lasix after failing oral diuretics PTA, low albumin requiring 24-hour urine protein to rule out nephrotic syndrome  Code Status : full   Family Communication:   (patient is alert, awake and coherent)   Consults  :  na  DVT Prophylaxis  :  Lovenox -- SCDs    Lab Results  Component Value Date   PLT 294 10/18/2020    Inpatient Medications  Scheduled Meds: . citalopram  10 mg Oral Daily  . enoxaparin (LOVENOX) injection  40 mg Subcutaneous Q24H  . feeding supplement (GLUCERNA SHAKE)  237 mL Oral BID BM  . furosemide  40 mg Intravenous BID  . gabapentin  100 mg Oral Daily  . insulin aspart  0-5 Units Subcutaneous QHS  . insulin aspart  0-9 Units Subcutaneous TID WC  . insulin aspart  3 Units Subcutaneous TID WC  . insulin glargine  42 Units Subcutaneous Q24H  . lisinopril  10 mg Oral Daily  . multivitamin with minerals  1 tablet Oral Daily  . polyethylene glycol  17 g Oral Daily  .  promethazine  25 mg Intravenous Once  . traZODone  50 mg Oral QHS   Continuous Infusions: PRN Meds:.acetaminophen, dextrose, ondansetron (ZOFRAN) IV, senna-docusate    Anti-infectives (From admission, onward)   None        Objective:   Vitals:   10/19/20 2236 10/19/20 2238 10/20/20 0435 10/20/20 0938  BP: (!) 135/98 (!) 135/98 112/73 114/74  Pulse: (!) 113 (!) 114 (!) 114 (!) 114  Resp: 18 18 20 18   Temp: 99.7 F (37.6 C) 99.7 F (37.6 C) 100.2 F (37.9 C) 99.4 F (37.4 C)  TempSrc:    Oral  SpO2: 99% 100% 99% 100%  Weight:      Height:        Wt Readings from Last 3 Encounters:  10/19/20 59.5 kg  10/18/20 61.4 kg  10/12/20 55.9 kg     Intake/Output Summary (Last 24 hours) at 10/20/2020 0941 Last data filed at 10/19/2020 1859 Gross per 24 hour  Intake --  Output 250 ml  Net -250 ml    Physical Exam  Gen:- Awake Alert, speaking in complete sentences HEENT:- Diamond [name].AT, No sclera icterus Neck-Supple Neck,No JVD,.  Lungs-  CTAB , fair symmetrical air movement CV- S1, S2 normal, regular  Abd-  +ve B.Sounds, Abd Soft, NT, lower abdominal wall edema,    Extremity/Skin:-2+ pitting edema, pedal pulses present  Psych-affect is appropriate, oriented x3 Neuro-no new focal deficits, no tremors   Data Review:   Micro Results Recent Results (from the past 240 hour(s))  Respiratory Panel by RT PCR (Flu A&B, Covid) - Nasopharyngeal Swab     Status: None   Collection Time: 10/18/20 12:21 PM   Specimen: Nasopharyngeal Swab  Result Value Ref Range Status   SARS Coronavirus 2 by RT PCR NEGATIVE NEGATIVE Final    Comment: (NOTE) SARS-CoV-2 target nucleic acids are NOT DETECTED.  The SARS-CoV-2 RNA is generally detectable in upper respiratoy specimens during the acute phase of infection. The lowest concentration of SARS-CoV-2 viral copies this assay can detect is 131 copies/mL. A negative result does not preclude SARS-Cov-2 infection and should not be used as the sole  basis for treatment or other patient management decisions. A negative result may occur with  improper specimen collection/handling, submission of specimen other than nasopharyngeal swab, presence of viral mutation(s) within the areas targeted by this assay, and inadequate number of viral copies (<131 copies/mL). A negative result must be combined with clinical observations, patient history, and epidemiological information. The expected result is Negative.  Fact Sheet for Patients:  PinkCheek.be  Fact Sheet for Healthcare Providers:  GravelBags.it  This test is no t yet approved or cleared by the [place] FDA and  has been authorized for detection and/or diagnosis of SARS-CoV-2 by FDA under an Emergency Use Authorization (EUA). This EUA will remain  in effect (meaning this test can be  used) for the duration of the COVID-19 declaration under Section 564(b)(1) of the Act, 21 U.S.C. section 360bbb-3(b)(1), unless the authorization is terminated or revoked sooner.     Influenza A by PCR NEGATIVE NEGATIVE Final   Influenza B by PCR NEGATIVE NEGATIVE Final    Comment: (NOTE) The Xpert Xpress SARS-CoV-2/FLU/RSV assay is intended as an aid in  the diagnosis of influenza from Nasopharyngeal swab specimens and  should not be used as a sole basis for treatment. Nasal washings and  aspirates are unacceptable for Xpert Xpress SARS-CoV-2/FLU/RSV  testing.  Fact Sheet for Patients: PinkCheek.be  Fact Sheet for Healthcare Providers: GravelBags.it  This test is not yet approved or cleared by the [place] FDA and  has been authorized for detection and/or diagnosis of SARS-CoV-2 by  FDA under an Emergency Use Authorization (EUA). This EUA will remain  in effect (meaning this test can be used) for the duration of the  Covid-19 declaration under Section 564(b)(1) of the  Act, 21  U.S.C. section 360bbb-3(b)(1), unless the authorization is  terminated or revoked. Performed at [facility], [address] [place], [place] [address]   MRSA PCR Screening     Status: None   Collection Time: 10/18/20 10:14 PM   Specimen: Nasal Mucosa; Nasopharyngeal  Result Value Ref Range Status   MRSA by PCR NEGATIVE NEGATIVE Final    Comment:        The GeneXpert MRSA Assay (FDA approved for NASAL specimens only), is one component of a comprehensive MRSA colonization surveillance program. It is not intended to diagnose MRSA infection nor to guide or monitor treatment for MRSA infections. Performed at [facility], [address], [place], [place] [address]     Radiology Reports US Abdomen Complete  Result Date: 10/18/2020 CLINICAL DATA:  24 year old female with abdominal distension. EXAM: ABDOMEN ULTRASOUND COMPLETE COMPARISON:  None. FINDINGS: Gallbladder: No gallstones or wall thickening visualized. No sonographic Murphy sign noted by sonographer. Common bile duct: Diameter: 2 mm Liver: No focal lesion identified. Within normal limits in parenchymal echogenicity. Portal vein is patent on color Doppler imaging with normal direction of blood flow towards the liver. IVC: No abnormality visualized. Pancreas: Visualized portion unremarkable. Spleen: Size and appearance within normal limits. Right Kidney: Length: 10.5 cm. Mild increased echogenicity. No hydronephrosis or shadowing stone. Left Kidney: Length: 11.6 cm. Mildly echogenic. No hydronephrosis or shadowing stone. Abdominal aorta: No aneurysm visualized. Other findings: None. IMPRESSION: Mildly echogenic kidneys, nonspecific, may represent a degree of renal insufficiency. Clinical correlation is recommended. No other abnormality identified. Electronically Signed   By: Anner Crete M.D.   On: 10/18/2020 17:14   DG Chest Port 1 View  Result Date: 10/18/2020 CLINICAL DATA:  Shortness of breath. EXAM: PORTABLE CHEST 1  VIEW COMPARISON:  09/29/2020. FINDINGS: The heart size and mediastinal contours are within normal limits. Both lungs are clear. No visible pleural effusions or pneumothorax. The visualized skeletal structures are unremarkable. IMPRESSION: No active disease. Electronically Signed   By: Margaretha Sheffield MD   On: 10/18/2020 13:07   ECHOCARDIOGRAM COMPLETE  Result Date: 10/19/2020    ECHOCARDIOGRAM REPORT   Patient Name:   ROSANNA BICKLE Date of Exam: 10/19/2020 Medical Rec #:  [id number]        Height:       59.0 in Accession #:    [id number]       Weight:       131.2 lb Date of Birth:  1995/12/15  BSA:          1.542 m Patient Age:    24 years         BP:           141/91 mmHg Patient Gender: F                HR:           106 bpm. Exam Location:  [facility] Procedure: 2D Echo Indications:    Dyspnea 786.09 / R06.00  History:        Patient has no prior history of Echocardiogram examinations.                 Risk Factors:Former Smoker and Diabetes.  Sonographer:    Leavy Cella RDCS (AE) Referring Phys: [place]  1. Left ventricular ejection fraction, by estimation, is 65 to 70%. The left ventricle has normal function. The left ventricle has no regional wall motion abnormalities. There is mild left ventricular hypertrophy. Left ventricular diastolic parameters were normal.  2. Right ventricular systolic function is normal. The right ventricular size is normal. Tricuspid regurgitation signal is inadequate for assessing PA pressure.  3. The mitral valve is grossly normal. Trivial mitral valve regurgitation.  4. The aortic valve is tricuspid. Aortic valve regurgitation is not visualized.  5. The inferior vena cava is normal in size with greater than 50% respiratory variability, suggesting right atrial pressure of 3 mmHg. FINDINGS  Left Ventricle: Left ventricular ejection fraction, by estimation, is 65 to 70%. The left ventricle has normal function. The left ventricle has no  regional wall motion abnormalities. The left ventricular internal cavity size was normal in size. There is  mild left ventricular hypertrophy. Left ventricular diastolic parameters were normal. Right Ventricle: The right ventricular size is normal. No increase in right ventricular wall thickness. Right ventricular systolic function is normal. Tricuspid regurgitation signal is inadequate for assessing PA pressure. Left Atrium: Left atrial size was normal in size. Right Atrium: Right atrial size was normal in size. Pericardium: There is no evidence of pericardial effusion. Mitral Valve: The mitral valve is grossly normal. Trivial mitral valve regurgitation. Tricuspid Valve: The tricuspid valve is grossly normal. Tricuspid valve regurgitation is trivial. Aortic Valve: The aortic valve is tricuspid. Aortic valve regurgitation is not visualized. Pulmonic Valve: The pulmonic valve was grossly normal. Pulmonic valve regurgitation is trivial. Aorta: The aortic root is normal in size and structure. Venous: The inferior vena cava is normal in size with greater than 50% respiratory variability, suggesting right atrial pressure of 3 mmHg. IAS/Shunts: No atrial level shunt detected by color flow Doppler.  LEFT VENTRICLE PLAX 2D LVIDd:         3.04 cm  Diastology LVIDs:         1.98 cm  LV e' medial:    9.25 cm/s LV PW:         1.37 cm  LV E/e' medial:  7.2 LV IVS:        1.02 cm  LV e' lateral:   12.10 cm/s LVOT diam:     1.80 cm  LV E/e' lateral: 5.5 LVOT Area:     2.54 cm  RIGHT VENTRICLE RV S prime:     16.30 cm/s TAPSE (M-mode): 2.5 cm LEFT ATRIUM             Index       RIGHT ATRIUM          Index LA diam:  2.50 cm 1.62 cm/m  RA Area:     9.33 cm LA Vol (A2C):   23.3 ml 15.11 ml/m RA Volume:   19.10 ml 12.39 ml/m LA Vol (A4C):   22.4 ml 14.53 ml/m LA Biplane Vol: 25.0 ml 16.22 ml/m   AORTA Ao Root diam: 1.90 cm MITRAL VALVE MV Area (PHT): 4.31 cm    SHUNTS MV Decel Time: 176 msec    Systemic Diam: 1.80 cm MV E  velocity: 66.80 cm/s MV A velocity: 19.70 cm/s MV E/A ratio:  3.39 Rozann Lesches MD Electronically signed by Rozann Lesches MD Signature Date/Time: 10/19/2020/11:13:34 AM    Final      CBC Recent Labs  Lab 10/18/20 1212  WBC 5.0  HGB 11.4*  HCT 35.7*  PLT 294  MCV 93.0  MCH 29.7  MCHC 31.9  RDW 13.2  LYMPHSABS 1.3  MONOABS 0.4  EOSABS 0.0  BASOSABS 0.0    Chemistries  Recent Labs  Lab 10/18/20 1212 10/18/20 2147 10/19/20 0150 10/19/20 0532 10/19/20 0936 10/19/20 1314 10/20/20 0451  NA 128*   < > 134* 134* 136 135 136  K 4.4   < > 4.3 4.1 4.0 4.3 4.3  CL 96*   < > 100 100 99 98 101  CO2 26   < > 26 29 30 30 30   GLUCOSE 598*   < > 182* 130* 177* 269* 230*  BUN 14   < > 16 16 15 15 19   CREATININE 0.83   < > 0.87 0.84 0.83 0.80 0.87  CALCIUM 8.7*   < > 8.5* 8.4* 8.5* 8.7* 8.7*  AST 78*  --   --   --   --   --   --   ALT 89*  --   --   --   --   --   --   ALKPHOS 126  --   --   --   --   --   --   BILITOT 0.7  --   --   --   --   --   --    < > = values in this interval not displayed.   ------------------------------------------------------------------------------------------------------------------ No results for input(s): CHOL, HDL, LDLCALC, TRIG, CHOLHDL, LDLDIRECT in the last 72 hours.  Lab Results  Component Value Date   HGBA1C >15.5 (H) 08/25/2020   ------------------------------------------------------------------------------------------------------------------ No results for input(s): TSH, T4TOTAL, T3FREE, THYROIDAB in the last 72 hours.  Invalid input(s): FREET3 ------------------------------------------------------------------------------------------------------------------ No results for input(s): VITAMINB12, FOLATE, FERRITIN, TIBC, IRON, RETICCTPCT in the last 72 hours.  Coagulation profile No results for input(s): INR, PROTIME in the last 168 hours.  No results for input(s): DDIMER in the last 72 hours.  Cardiac Enzymes No results for  input(s): CKMB, TROPONINI, MYOGLOBIN in the last 168 hours.  Invalid input(s): CK ------------------------------------------------------------------------------------------------------------------    Component Value Date/Time   BNP 83.0 10/18/2020 1212     Erlene Devita M.D on 10/20/2020 at 9:41 AM  Go to [email or web address] - for contact info  [facility] - Office  [phone]

## 2020-10-20 NOTE — Progress Notes (Signed)
Inpatient Diabetes Program Recommendations  AACE/ADA: New Consensus Statement on Inpatient Glycemic Control   Target Ranges:  Prepandial:   less than 140 mg/dL      Peak postprandial:   less than 180 mg/dL (1-2 hours)      Critically ill patients:  140 - 180 mg/dL  Results for RUSSIA, SCHEIDERER (MRN [id number]) as of 10/20/2020 07:45  Ref. Range 10/19/2020 08:17 10/19/2020 11:18 10/19/2020 16:17 10/19/2020 19:56 10/20/2020 04:45  Glucose-Capillary Latest Ref Range: 70 - 99 mg/dL 111 (H) 176 (H) 241 (H) 303 (H) 206 (H)    Review of Glycemic Control  Diabetes history: DM1 Outpatient Diabetes medications: Basaglar 40 units QHS, Humalog 10 units TID with meals, plus Humalog for corrrection Current orders for Inpatient glycemic control: Lantus 40 units Q24H, Novolog 0-15 units TID with meals  Inpatient Diabetes Program Recommendations:    Insulin: Please consider decreasing Novolog to 0-9 units TID with meals, adding Novolog 0-5 units QHS for bedtime correction, and adding Novolog 4 units TID with meals for meal coverage if patient eats at least 50% of meals.  Thanks, Barnie Alderman, RN, MSN, CDE Diabetes Coordinator Inpatient Diabetes Program [phone] (Team Pager from 8am to 5pm)

## 2020-10-21 LAB — GLUCOSE, CAPILLARY
Glucose-Capillary: 104 mg/dL — ABNORMAL HIGH (ref 70–99)
Glucose-Capillary: 336 mg/dL — ABNORMAL HIGH (ref 70–99)

## 2020-10-21 LAB — RENAL FUNCTION PANEL
Albumin: 2.1 g/dL — ABNORMAL LOW (ref 3.5–5.0)
Albumin: 2.1 g/dL — ABNORMAL LOW (ref 3.5–5.0)
Anion gap: 7 (ref 5–15)
Anion gap: 7 (ref 5–15)
BUN: 28 mg/dL — ABNORMAL HIGH (ref 6–20)
BUN: 28 mg/dL — ABNORMAL HIGH (ref 6–20)
CO2: 29 mmol/L (ref 22–32)
CO2: 29 mmol/L (ref 22–32)
Calcium: 8.7 mg/dL — ABNORMAL LOW (ref 8.9–10.3)
Calcium: 8.7 mg/dL — ABNORMAL LOW (ref 8.9–10.3)
Chloride: 100 mmol/L (ref 98–111)
Chloride: 100 mmol/L (ref 98–111)
Creatinine, Ser: 0.94 mg/dL (ref 0.44–1.00)
Creatinine, Ser: 0.98 mg/dL (ref 0.44–1.00)
GFR, Estimated: >60 mL/min (ref >60)
GFR, Estimated: >60 mL/min (ref >60)
Glucose, Bld: 111 mg/dL — ABNORMAL HIGH (ref 70–99)
Glucose, Bld: 113 mg/dL — ABNORMAL HIGH (ref 70–99)
Phosphorus: 6.4 mg/dL — ABNORMAL HIGH (ref 2.5–4.6)
Phosphorus: 6.5 mg/dL — ABNORMAL HIGH (ref 2.5–4.6)
Potassium: 4.3 mmol/L (ref 3.5–5.1)
Potassium: 4.3 mmol/L (ref 3.5–5.1)
Sodium: 136 mmol/L (ref 135–145)
Sodium: 136 mmol/L (ref 135–145)

## 2020-10-21 LAB — CBC
HCT: 33 % — ABNORMAL LOW (ref 36.0–46.0)
Hemoglobin: 10.7 g/dL — ABNORMAL LOW (ref 12.0–15.0)
MCH: 30.6 pg (ref 26.0–34.0)
MCHC: 32.4 g/dL (ref 30.0–36.0)
MCV: 94.3 fL (ref 80.0–100.0)
Platelets: 310 10*3/uL (ref 150–400)
RBC: 3.5 MIL/uL — ABNORMAL LOW (ref 3.87–5.11)
RDW: 13.3 % (ref 11.5–15.5)
WBC: 5.3 10*3/uL (ref 4.0–10.5)
nRBC: 0 % (ref 0.0–0.2)

## 2020-10-21 MED ORDER — FUROSEMIDE 40 MG PO TABS: 40.0000 mg | ORAL_TABLET | Freq: Every day | ORAL | 3 refills | Status: DC

## 2020-10-21 MED ORDER — ALBUTEROL SULFATE HFA 108 (90 BASE) MCG/ACT IN AERS: 2.0000 | INHALATION_SPRAY | Freq: Four times a day (QID) | RESPIRATORY_TRACT | 1 refills | Status: DC | PRN

## 2020-10-21 MED ORDER — INSULIN LISPRO (1 UNIT DIAL) 100 UNIT/ML (KWIKPEN)
4.0000 [IU] | PEN_INJECTOR | SUBCUTANEOUS | 11 refills | Status: DC
Start: 2020-10-21 — End: 2020-11-01

## 2020-10-21 MED ORDER — ADULT MULTIVITAMIN W/MINERALS CH
1.0000 | ORAL_TABLET | Freq: Every day | ORAL | 5 refills | Status: DC
Start: 2020-10-22 — End: 2020-12-28

## 2020-10-21 MED ORDER — POLYETHYLENE GLYCOL 3350 17 G PO PACK
17.0000 g | PACK | Freq: Every day | ORAL | 0 refills | Status: DC | PRN
Start: 2020-10-21 — End: 2021-01-23

## 2020-10-21 MED ORDER — LISINOPRIL 10 MG PO TABS
10.0000 mg | ORAL_TABLET | Freq: Every day | ORAL | 2 refills | Status: DC
Start: 2020-10-21 — End: 2020-11-01

## 2020-10-21 MED ORDER — POTASSIUM CHLORIDE ER 10 MEQ PO TBCR: 10.0000 meq | EXTENDED_RELEASE_TABLET | Freq: Every day | ORAL | 2 refills | Status: DC

## 2020-10-21 MED ORDER — BASAGLAR KWIKPEN 100 UNIT/ML ~~LOC~~ SOPN: 42.0000 [IU] | PEN_INJECTOR | Freq: Every day | SUBCUTANEOUS | 5 refills | Status: DC

## 2020-10-21 NOTE — Progress Notes (Signed)
Inpatient Diabetes Program Recommendations  AACE/ADA: New Consensus Statement on Inpatient Glycemic Control   Target Ranges:  Prepandial:   less than 140 mg/dL      Peak postprandial:   less than 180 mg/dL (1-2 hours)      Critically ill patients:  140 - 180 mg/dL   Results for CHAUNDA, VANDERGRIFF (MRN [id number]) as of 10/21/2020 07:31  Ref. Range 10/20/2020 04:45 10/20/2020 07:52 10/20/2020 11:16 10/20/2020 16:07 10/20/2020 21:25  Glucose-Capillary Latest Ref Range: 70 - 99 mg/dL 206 (H) 189 (H) 214 (H) 457 (H) 121 (H)   Review of Glycemic Control  Diabetes history:DM1 Outpatient Diabetes medications:Basaglar 40 units QHS, Humalog 10 units TID with meals, plus Humalog for corrrection Current orders for Inpatient glycemic control:Lantus 42 units Q24H, Novolog 4 units TID with meals for meal coverage, Novolog 0-15 units TID with meals, Novolog 0-5 units QHS  Inpatient Diabetes Program Recommendations:    Insulin: Please consider increasing meal coverage to Novolog 6 units TID with meals and decreasing correction to Novolog 0-9 units TID with meals.  Thanks, Barnie Alderman, RN, MSN, CDE Diabetes Coordinator Inpatient Diabetes Program [phone] (Team Pager from 8am to 5pm)

## 2020-10-21 NOTE — Plan of Care (Signed)
  Problem: Education: Goal: Knowledge of General Education information will improve Description Including pain rating scale, medication(s)/side effects and non-pharmacologic comfort measures Outcome: Progressing   Problem: Health Behavior/Discharge Planning: Goal: Ability to manage health-related needs will improve Outcome: Progressing   

## 2020-10-21 NOTE — Progress Notes (Signed)
°   10/20/20 2106  Assess: MEWS Score  Temp 99.7 F (37.6 C)  BP 113/78  Pulse Rate (!) 115  Resp 20  SpO2 99 %  O2 Device Room Air  Assess: MEWS Score  MEWS Temp 0  MEWS Systolic 0  MEWS Pulse 2  MEWS RR 0  MEWS LOC 0  MEWS Score 2  MEWS Score Color Yellow  Assess: if the MEWS score is Yellow or Red  Were vital signs taken at a resting state? Yes  Focused Assessment No change from prior assessment  Early Detection of Sepsis Score *See Row Information* Low  MEWS guidelines implemented *See Row Information* No, other (Comment) (pt is normally tachy, MD notified previously)

## 2020-10-21 NOTE — Plan of Care (Signed)
  Problem: Education: Goal: Knowledge of General Education information will improve Description: Including pain rating scale, medication(s)/side effects and non-pharmacologic comfort measures 10/21/2020 1259 by Santa Lighter, RN Outcome: Adequate for Discharge 10/21/2020 1258 by Santa Lighter, RN Outcome: Progressing   Problem: Clinical Measurements: Goal: Ability to maintain clinical measurements within normal limits will improve Outcome: Adequate for Discharge Goal: Will remain free from infection Outcome: Adequate for Discharge Goal: Diagnostic test results will improve Outcome: Adequate for Discharge Goal: Respiratory complications will improve Outcome: Adequate for Discharge Goal: Cardiovascular complication will be avoided Outcome: Adequate for Discharge   Problem: Activity: Goal: Risk for activity intolerance will decrease Outcome: Adequate for Discharge   Problem: Nutrition: Goal: Adequate nutrition will be maintained Outcome: Adequate for Discharge   Problem: Coping: Goal: Level of anxiety will decrease Outcome: Adequate for Discharge   Problem: Elimination: Goal: Will not experience complications related to bowel motility Outcome: Adequate for Discharge Goal: Will not experience complications related to urinary retention Outcome: Adequate for Discharge   Problem: Pain Managment: Goal: General experience of comfort will improve Outcome: Adequate for Discharge   Problem: Safety: Goal: Ability to remain free from injury will improve Outcome: Adequate for Discharge   Problem: Skin Integrity: Goal: Risk for impaired skin integrity will decrease Outcome: Adequate for Discharge   Problem: Food- and Nutrition-Related Knowledge Deficit (NB-1.1) Goal: Nutrition education Description: Formal process to instruct or train a patient/client in a skill or to impart knowledge to help patients/clients voluntarily manage or modify food choices and eating behavior to  maintain or improve health. Outcome: Adequate for Discharge

## 2020-10-21 NOTE — Progress Notes (Signed)
   10/21/20 0556  Assess: MEWS Score  Temp 98.8 F (37.1 C)  BP 99/75  Pulse Rate (!) 109  Resp 20  SpO2 100 %  O2 Device Room Air  Assess: MEWS Score  MEWS Temp 0  MEWS Systolic 1  MEWS Pulse 1  MEWS RR 0  MEWS LOC 0  MEWS Score 2  MEWS Score Color Yellow  Assess: if the MEWS score is Yellow or Red  Were vital signs taken at a resting state? Yes  Focused Assessment No change from prior assessment  Early Detection of Sepsis Score *See Row Information* Low  MEWS guidelines implemented *See Row Information* No, other (Comment) (pt normally tachy, MD aware)

## 2020-10-21 NOTE — Progress Notes (Signed)
Nsg Discharge Note  Admit Date:  10/18/2020 Discharge date: 10/21/2020   [name] to be D/C'd Home per MD order.  AVS completed.  Copy for chart, and copy for patient signed, and dated. Removed IV-clean, dry, intact. Reviewed d/c paperwork with patient. Gave her preprinted prescription and work note.. Wheeled stable patient and belongings to main entrance. I waited with her for 20 minutes and her ride was not there so I left her to sit and wait in the lobby. Patient/caregiver able to verbalize understanding.  Discharge Medication: Allergies as of 10/21/2020      Reactions   Amoxicillin Hives   Did it involve swelling of the face/tongue/throat, SOB, or low BP? no  Did it involve sudden or severe rash/hives, skin peeling, or any reaction on the inside of your mouth or nose? no Did you need to seek medical attention at a hospital or doctor's office? no When did it last happen?unk If all above answers are "NO", may proceed with cephalosporin use.   Penicillins Hives, Itching   Did it involve swelling of the face/tongue/throat, SOB, or low BP? no  Did it involve sudden or severe rash/hives, skin peeling, or any reaction on the inside of your mouth or nose? no Did you need to seek medical attention at a hospital or doctor's office? no When did it last happen?unk If all above answers are "NO", may proceed with cephalosporin use. Did it involve swelling of the face/tongue/throat, SOB, or low BP? no  Did it involve sudden or severe rash/hives, skin peeling, or any reaction on the inside of your mouth or nose? no Did you need to seek medical attention at a hospital or doctor's office? no When did it last happen?unk If all above answers are "NO", may proceed with cephalosporin use. Tolerated keflex May 2021 with no issue.   Augmentin [amoxicillin-pot Clavulanate] Other (See Comments)   Reaction unknown   Ceftriaxone Sodium In Dextrose    Clavulanic Acid    Adhesive  [tape] Itching, Rash   Latex Itching, Rash   Other Itching, Rash   Pineapple Rash   Rash on tongue and throat      Medication List    STOP taking these medications   insulin lispro 100 UNIT/ML injection Commonly known as: HUMALOG Replaced by: insulin lispro 100 UNIT/ML KwikPen     TAKE these medications   albuterol 108 (90 Base) MCG/ACT inhaler Commonly known as: VENTOLIN HFA Inhale 2 puffs into the lungs every 6 (six) hours as needed. For shortness of breath   Basaglar KwikPen 100 UNIT/ML Inject 42 Units into the skin at bedtime. What changed:   medication strength  how much to take   citalopram 10 MG tablet Commonly known as: CELEXA Take 10 mg by mouth daily.   furosemide 40 MG tablet Commonly known as: LASIX Take 1 tablet (40 mg total) by mouth daily. What changed: medication strength   gabapentin 100 MG capsule Commonly known as: NEURONTIN Take 1 capsule (100 mg total) by mouth daily.   insulin lispro 100 UNIT/ML KwikPen Commonly known as: HumaLOG KwikPen Inject 4-16 Units into the skin See admin instructions. insulin aspart (novoLOG) injection 4-16 Units -4-16 Units Subcutaneous, 3 times daily with meals CBG < 70: -eat/drink something CBG 70 - 120: 0 unit CBG 121 - 150: 0 unit  CBG 151 - 200: 4 unit CBG 201 - 250: 6 units CBG 251 - 300: 9 units CBG 301 - 350: 12 units  CBG 351 - 400:  14 units  CBG > 400: 16 units Replaces: insulin lispro 100 UNIT/ML injection   lisinopril 10 MG tablet Commonly known as: ZESTRIL Take 1 tablet (10 mg total) by mouth daily.   multivitamin with minerals Tabs tablet Take 1 tablet by mouth daily. Start taking on: October 22, 2020   polyethylene glycol 17 g packet Commonly known as: MIRALAX / GLYCOLAX Take 17 g by mouth daily as needed.   potassium chloride 10 MEQ tablet Commonly known as: KLOR-CON Take 1 tablet (10 mEq total) by mouth daily. Take While taking Lasix/furosemide   traZODone 50 MG tablet Commonly known as:  DESYREL Take 50 mg by mouth at bedtime.       Discharge Assessment: Vitals:   10/21/20 0556 10/21/20 1454  BP: 99/75 93/77  Pulse: (!) 109 (!) 114  Resp: 20 20  Temp: 98.8 F (37.1 C) 99.7 F (37.6 C)  SpO2: 100% 100%   Skin clean, dry and intact without evidence of skin break down, no evidence of skin tears noted. IV catheter discontinued intact. Site without signs and symptoms of complications - no redness or edema noted at insertion site, patient denies c/o pain - only slight tenderness at site.  Dressing with slight pressure applied.  D/c Instructions-Education: Discharge instructions given to patient/family with verbalized understanding. D/c education completed with patient/family including follow up instructions, medication list, d/c activities limitations if indicated, with other d/c instructions as indicated by MD - patient able to verbalize understanding, all questions fully answered. Patient instructed to return to ED, call 911, or call MD for any changes in condition.  Patient escorted via [place], and D/C home via private auto.  Santa Lighter, RN 10/21/2020 5:07 PM

## 2020-10-21 NOTE — Discharge Summary (Signed)
VIOLA KINNICK, is a 24 y.o. female  DOB 08/10/1996  MRN [id number].  Admission date:  10/18/2020  Admitting Physician  Kathie Dike, MD  Discharge Date:  10/21/2020   Primary MD  [name], PA-C  Recommendations for primary care physician for things to follow:   1)insulin aspart (novoLOG) injection 4-16 Units -4-16 Units Subcutaneous, 3 times daily with meals CBG < 70: -eat/drink something CBG 70 - 120: 0 unit CBG 121 - 150: 0 unit  CBG 151 - 200: 4 unit CBG 201 - 250: 6 units CBG 251 - 300: 9 units CBG 301 - 350: 12 units  CBG 351 - 400: 14 units  CBG > 400: 16 units  2) please keep your appointment with the primary care physician as advised  3)Avoid ibuprofen/Advil/Aleve/Motrin/Goody Powders/Naproxen/BC powders/Meloxicam/Diclofenac/Indomethacin and other Nonsteroidal anti-inflammatory medications as these will make you more likely to bleed and can cause stomach ulcers, can also cause Kidney problems.   Admission Diagnosis  Abdominal distention [R14.0] Hyponatremia [E87.1] Generalized edema [R60.1] Hyperglycemia [R73.9] Hyperglycemia due to type 1 diabetes mellitus (HCC) [E10.65]   Discharge Diagnosis  Abdominal distention [R14.0] Hyponatremia [E87.1] Generalized edema [R60.1] Hyperglycemia [R73.9] Hyperglycemia due to type 1 diabetes mellitus ([place]) [E10.65]    Active Problems:   Uncontrolled type 1 diabetes mellitus with hyperglycemia (HCC)   Hyperglycemia   Anasarca   Hypoalbuminemia   Edema      Past Medical History:  Diagnosis Date  . ADHD (attention deficit hyperactivity disorder) 2007  . Allergy   . Asthma   . Bipolar disorder ([place])   . Crohn's colitis ([place])    Dx Dr. Cherrie Gauze  . Depression    early childhood, trial of seroquel?  . Diabetic neuropathy ([place])   . GERD (gastroesophageal reflux disease)   . Hypertension   . Pancreatitis   . Panic attacks    . PTSD (post-traumatic stress disorder)   . Stroke ([place]) 07/2020   R side  . Type 1 diabetes ([place]) 2008    Past Surgical History:  Procedure Laterality Date  . ADENOIDECTOMY    . HERNIA REPAIR    . TONSILLECTOMY       HPI  from the history and physical done on the day of admission:    Chief Complaint: swelling, shortness of breath  HPI: YAILEEN HOFFERBER is a 24 y.o. female with medical history significant of uncontrolled type 1 diabetes, who has developed lower extremity edema for the past 3 months.  The patient was recently started on Lasix without significant benefit of her edema.  She has frequent visits to the emergency room for uncontrolled diabetes and hyperglycemia.  She was at her endocrinology office today when she was noted to be hyperglycemic and had significant edema and therefore was sent to the emergency room for evaluation.  She reports a cough for the past 2 weeks, but has not had any fever.  She does report shortness of breath and dyspnea on exertion.  She has had nausea, but no vomiting.  Bowel movements have been normal.  She feels that her abdomen is distended.  She has noticed worsening edema in her lower extremities and reports that her weight is up approximately 11 pounds in the past 5 days.  ED Course: She is noted to be severely hyperglycemic with a blood sugar of 598 in the emergency room.  She was started on IV insulin infusion.  She does not have any evidence of acidosis at this time.  Chest x-ray did not show any acute findings.  Albumin is low at 2.6, transaminases are mildly elevated with AST at 78 and ALT at 89.  Bilirubin is normal.  BNP is 83.  She has been referred for admission.    Hospital Course:   Brief Summary:- 24 y.o.femalewith medical history significant ofuncontrolled type 1 diabetes, who has developed lower extremity edema and anasarca in the setting of low albumin, admitted on 10/18/2020 after failing outpatient oral  diuretics  A/p 1)Dm1--uncontrolled DM1 with hyperglycemia,-recent A1c greater than 15.5 -- diagnosed at age 51 -No evidence of DKA at this time -Diabetic educator consult appreciated -Lantus insulin increased to 42 units nightly, sliding scale coverage adjusted -Did need to be compliant with diabetic diet and insulin regimen emphasized to patient  2)Hypoalbulminemia with Lower Extremity Edema/Anasarca/volume overload-patient ---dietitian consult appreciated albumin is 2.6 , protein creatinine ratio is high at 3.95 -UA with 3+ proteinuria -24-hour urine protein to rule out nephrotic syndrome collected and results IN progress -Glucerna supplements and lean protein recommended,  -Echo with EF of 65 to 70%, without wall motion abnormalities or diastolic dysfunction, BNP is only 83 -Treated with with IV Lasix--- -Discharge on oral Lasix -Continue TED stockings  3)Elevated LFTs--  AST 78 and ALT 89, T Bil 0.7 -Viral Hepatitis profile pending hep C appears equivocal -Hep A negative, hep B negative  4)Chronic normocytic and normochromic anemia--hemoglobin currently close to 11 which is at or above patient's recent baseline -No evidence of acute bleeding at this time  5)Social/Ethics--history of poor compliance, social work input appreciated  Disposition--- discharge home in stable conditionStatus is: Inpatient  Disposition: The patient is from: Home  Anticipated d/c is to: Home    Code Status : full   Family Communication:   (patient is alert, awake and coherent)   Consults  :  na  Discharge Condition: Stable  Follow UP   Follow-up Information    [name], PA-C. Schedule an appointment as soon as possible for a visit in 1 week(s).   Specialty: Physician Assistant Why: Repeat BMP Test Contact information: [address] [place] [address] [phone]               Diet and Activity recommendation:  As  advised  Discharge Instructions    Discharge Instructions    Call MD for:  difficulty breathing, headache or visual disturbances   Complete by: As directed    Call MD for:  persistant dizziness or light-headedness   Complete by: As directed    Call MD for:  persistant nausea and vomiting   Complete by: As directed    Call MD for:  severe uncontrolled pain   Complete by: As directed    Call MD for:  temperature >100.4   Complete by: As directed    Diet - low sodium heart healthy   Complete by: As directed    Diet Carb Modified   Complete by: As directed    Discharge instructions   Complete by: As directed  1)insulin aspart (novoLOG) injection 4-16 Units -4-16 Units Subcutaneous, 3 times daily with meals CBG < 70: -eat/drink something CBG 70 - 120: 0 unit CBG 121 - 150: 0 unit  CBG 151 - 200: 4 unit CBG 201 - 250: 6 units CBG 251 - 300: 9 units CBG 301 - 350: 12 units  CBG 351 - 400: 14 units  CBG > 400: 16 units  2) please keep your appointment with the primary care physician as advised  3)Avoid ibuprofen/Advil/Aleve/Motrin/Goody Powders/Naproxen/BC powders/Meloxicam/Diclofenac/Indomethacin and other Nonsteroidal anti-inflammatory medications as these will make you more likely to bleed and can cause stomach ulcers, can also cause Kidney problems.   Increase activity slowly   Complete by: As directed        Discharge Medications     Allergies as of 10/21/2020      Reactions   Amoxicillin Hives   Did it involve swelling of the face/tongue/throat, SOB, or low BP? no  Did it involve sudden or severe rash/hives, skin peeling, or any reaction on the inside of your mouth or nose? no Did you need to seek medical attention at a hospital or doctor's office? no When did it last happen?unk If all above answers are "NO", may proceed with cephalosporin use.   Penicillins Hives, Itching   Did it involve swelling of the face/tongue/throat, SOB, or low BP? no  Did it involve sudden  or severe rash/hives, skin peeling, or any reaction on the inside of your mouth or nose? no Did you need to seek medical attention at a hospital or doctor's office? no When did it last happen?unk If all above answers are "NO", may proceed with cephalosporin use. Did it involve swelling of the face/tongue/throat, SOB, or low BP? no  Did it involve sudden or severe rash/hives, skin peeling, or any reaction on the inside of your mouth or nose? no Did you need to seek medical attention at a hospital or doctor's office? no When did it last happen?unk If all above answers are "NO", may proceed with cephalosporin use. Tolerated keflex May 2021 with no issue.   Augmentin [amoxicillin-pot Clavulanate] Other (See Comments)   Reaction unknown   Ceftriaxone Sodium In Dextrose    Clavulanic Acid    Adhesive [tape] Itching, Rash   Latex Itching, Rash   Other Itching, Rash   Pineapple Rash   Rash on tongue and throat      Medication List    STOP taking these medications   insulin lispro 100 UNIT/ML injection Commonly known as: HUMALOG Replaced by: insulin lispro 100 UNIT/ML KwikPen     TAKE these medications   albuterol 108 (90 Base) MCG/ACT inhaler Commonly known as: VENTOLIN HFA Inhale 2 puffs into the lungs every 6 (six) hours as needed. For shortness of breath   Basaglar KwikPen 100 UNIT/ML Inject 42 Units into the skin at bedtime. What changed:   medication strength  how much to take   citalopram 10 MG tablet Commonly known as: CELEXA Take 10 mg by mouth daily.   furosemide 40 MG tablet Commonly known as: LASIX Take 1 tablet (40 mg total) by mouth daily. What changed: medication strength   gabapentin 100 MG capsule Commonly known as: NEURONTIN Take 1 capsule (100 mg total) by mouth daily.   insulin lispro 100 UNIT/ML KwikPen Commonly known as: HumaLOG KwikPen Inject 4-16 Units into the skin See admin instructions. insulin aspart (novoLOG) injection 4-16  Units -4-16 Units Subcutaneous, 3 times daily with meals CBG < 70: -eat/drink  something CBG 70 - 120: 0 unit CBG 121 - 150: 0 unit  CBG 151 - 200: 4 unit CBG 201 - 250: 6 units CBG 251 - 300: 9 units CBG 301 - 350: 12 units  CBG 351 - 400: 14 units  CBG > 400: 16 units Replaces: insulin lispro 100 UNIT/ML injection   lisinopril 10 MG tablet Commonly known as: ZESTRIL Take 1 tablet (10 mg total) by mouth daily.   multivitamin with minerals Tabs tablet Take 1 tablet by mouth daily. Start taking on: October 22, 2020   polyethylene glycol 17 g packet Commonly known as: MIRALAX / GLYCOLAX Take 17 g by mouth daily as needed.   potassium chloride 10 MEQ tablet Commonly known as: KLOR-CON Take 1 tablet (10 mEq total) by mouth daily. Take While taking Lasix/furosemide   traZODone 50 MG tablet Commonly known as: DESYREL Take 50 mg by mouth at bedtime.       Major procedures and Radiology Reports - PLEASE review detailed and final reports for all details, in brief -   US Abdomen Complete  Result Date: 10/18/2020 CLINICAL DATA:  24 year old female with abdominal distension. EXAM: ABDOMEN ULTRASOUND COMPLETE COMPARISON:  None. FINDINGS: Gallbladder: No gallstones or wall thickening visualized. No sonographic Murphy sign noted by sonographer. Common bile duct: Diameter: 2 mm Liver: No focal lesion identified. Within normal limits in parenchymal echogenicity. Portal vein is patent on color Doppler imaging with normal direction of blood flow towards the liver. IVC: No abnormality visualized. Pancreas: Visualized portion unremarkable. Spleen: Size and appearance within normal limits. Right Kidney: Length: 10.5 cm. Mild increased echogenicity. No hydronephrosis or shadowing stone. Left Kidney: Length: 11.6 cm. Mildly echogenic. No hydronephrosis or shadowing stone. Abdominal aorta: No aneurysm visualized. Other findings: None. IMPRESSION: Mildly echogenic kidneys, nonspecific, may represent a degree of  renal insufficiency. Clinical correlation is recommended. No other abnormality identified. Electronically Signed   By: Anner Crete M.D.   On: 10/18/2020 17:14   DG Chest Port 1 View  Result Date: 10/18/2020 CLINICAL DATA:  Shortness of breath. EXAM: PORTABLE CHEST 1 VIEW COMPARISON:  09/29/2020. FINDINGS: The heart size and mediastinal contours are within normal limits. Both lungs are clear. No visible pleural effusions or pneumothorax. The visualized skeletal structures are unremarkable. IMPRESSION: No active disease. Electronically Signed   By: Margaretha Sheffield MD   On: 10/18/2020 13:07   ECHOCARDIOGRAM COMPLETE  Result Date: 10/19/2020    ECHOCARDIOGRAM REPORT   Patient Name:   JANESHA BRISSETTE Date of Exam: 10/19/2020 Medical Rec #:  [id number]        Height:       59.0 in Accession #:    [id number]       Weight:       131.2 lb Date of Birth:  [birth year]        BSA:          1.542 m Patient Age:    24 years         BP:           141/91 mmHg Patient Gender: F                HR:           106 bpm. Exam Location:  [facility] Procedure: 2D Echo Indications:    Dyspnea 786.09 / R06.00  History:        Patient has no prior history of Echocardiogram examinations.  Risk Factors:Former Smoker and Diabetes.  Sonographer:    Leavy Cella RDCS (AE) Referring Phys: [place]  1. Left ventricular ejection fraction, by estimation, is 65 to 70%. The left ventricle has normal function. The left ventricle has no regional wall motion abnormalities. There is mild left ventricular hypertrophy. Left ventricular diastolic parameters were normal.  2. Right ventricular systolic function is normal. The right ventricular size is normal. Tricuspid regurgitation signal is inadequate for assessing PA pressure.  3. The mitral valve is grossly normal. Trivial mitral valve regurgitation.  4. The aortic valve is tricuspid. Aortic valve regurgitation is not visualized.  5. The inferior vena  cava is normal in size with greater than 50% respiratory variability, suggesting right atrial pressure of 3 mmHg. FINDINGS  Left Ventricle: Left ventricular ejection fraction, by estimation, is 65 to 70%. The left ventricle has normal function. The left ventricle has no regional wall motion abnormalities. The left ventricular internal cavity size was normal in size. There is  mild left ventricular hypertrophy. Left ventricular diastolic parameters were normal. Right Ventricle: The right ventricular size is normal. No increase in right ventricular wall thickness. Right ventricular systolic function is normal. Tricuspid regurgitation signal is inadequate for assessing PA pressure. Left Atrium: Left atrial size was normal in size. Right Atrium: Right atrial size was normal in size. Pericardium: There is no evidence of pericardial effusion. Mitral Valve: The mitral valve is grossly normal. Trivial mitral valve regurgitation. Tricuspid Valve: The tricuspid valve is grossly normal. Tricuspid valve regurgitation is trivial. Aortic Valve: The aortic valve is tricuspid. Aortic valve regurgitation is not visualized. Pulmonic Valve: The pulmonic valve was grossly normal. Pulmonic valve regurgitation is trivial. Aorta: The aortic root is normal in size and structure. Venous: The inferior vena cava is normal in size with greater than 50% respiratory variability, suggesting right atrial pressure of 3 mmHg. IAS/Shunts: No atrial level shunt detected by color flow Doppler.  LEFT VENTRICLE PLAX 2D LVIDd:         3.04 cm  Diastology LVIDs:         1.98 cm  LV e' medial:    9.25 cm/s LV PW:         1.37 cm  LV E/e' medial:  7.2 LV IVS:        1.02 cm  LV e' lateral:   12.10 cm/s LVOT diam:     1.80 cm  LV E/e' lateral: 5.5 LVOT Area:     2.54 cm  RIGHT VENTRICLE RV S prime:     16.30 cm/s TAPSE (M-mode): 2.5 cm LEFT ATRIUM             Index       RIGHT ATRIUM          Index LA diam:        2.50 cm 1.62 cm/m  RA Area:     9.33 cm LA  Vol (A2C):   23.3 ml 15.11 ml/m RA Volume:   19.10 ml 12.39 ml/m LA Vol (A4C):   22.4 ml 14.53 ml/m LA Biplane Vol: 25.0 ml 16.22 ml/m   AORTA Ao Root diam: 1.90 cm MITRAL VALVE MV Area (PHT): 4.31 cm    SHUNTS MV Decel Time: 176 msec    Systemic Diam: 1.80 cm MV E velocity: 66.80 cm/s MV A velocity: 19.70 cm/s MV E/A ratio:  3.39 Rozann Lesches MD Electronically signed by Rozann Lesches MD Signature Date/Time: 10/19/2020/11:13:34 AM    Final  Micro Results    Recent Results (from the past 240 hour(s))  Respiratory Panel by RT PCR (Flu A&B, Covid) - Nasopharyngeal Swab     Status: None   Collection Time: 10/18/20 12:21 PM   Specimen: Nasopharyngeal Swab  Result Value Ref Range Status   SARS Coronavirus 2 by RT PCR NEGATIVE NEGATIVE Final    Comment: (NOTE) SARS-CoV-2 target nucleic acids are NOT DETECTED.  The SARS-CoV-2 RNA is generally detectable in upper respiratoy specimens during the acute phase of infection. The lowest concentration of SARS-CoV-2 viral copies this assay can detect is 131 copies/mL. A negative result does not preclude SARS-Cov-2 infection and should not be used as the sole basis for treatment or other patient management decisions. A negative result may occur with  improper specimen collection/handling, submission of specimen other than nasopharyngeal swab, presence of viral mutation(s) within the areas targeted by this assay, and inadequate number of viral copies (<131 copies/mL). A negative result must be combined with clinical observations, patient history, and epidemiological information. The expected result is Negative.  Fact Sheet for Patients:  PinkCheek.be  Fact Sheet for Healthcare Providers:  GravelBags.it  This test is no t yet approved or cleared by the [place] FDA and  has been authorized for detection and/or diagnosis of SARS-CoV-2 by FDA under an Emergency Use Authorization  (EUA). This EUA will remain  in effect (meaning this test can be used) for the duration of the COVID-19 declaration under Section 564(b)(1) of the Act, 21 U.S.C. section 360bbb-3(b)(1), unless the authorization is terminated or revoked sooner.     Influenza A by PCR NEGATIVE NEGATIVE Final   Influenza B by PCR NEGATIVE NEGATIVE Final    Comment: (NOTE) The Xpert Xpress SARS-CoV-2/FLU/RSV assay is intended as an aid in  the diagnosis of influenza from Nasopharyngeal swab specimens and  should not be used as a sole basis for treatment. Nasal washings and  aspirates are unacceptable for Xpert Xpress SARS-CoV-2/FLU/RSV  testing.  Fact Sheet for Patients: PinkCheek.be  Fact Sheet for Healthcare Providers: GravelBags.it  This test is not yet approved or cleared by the [place] FDA and  has been authorized for detection and/or diagnosis of SARS-CoV-2 by  FDA under an Emergency Use Authorization (EUA). This EUA will remain  in effect (meaning this test can be used) for the duration of the  Covid-19 declaration under Section 564(b)(1) of the Act, 21  U.S.C. section 360bbb-3(b)(1), unless the authorization is  terminated or revoked. Performed at [facility], [address] [place], [place] [address]   MRSA PCR Screening     Status: None   Collection Time: 10/18/20 10:14 PM   Specimen: Nasal Mucosa; Nasopharyngeal  Result Value Ref Range Status   MRSA by PCR NEGATIVE NEGATIVE Final    Comment:        The GeneXpert MRSA Assay (FDA approved for NASAL specimens only), is one component of a comprehensive MRSA colonization surveillance program. It is not intended to diagnose MRSA infection nor to guide or monitor treatment for MRSA infections. Performed at [facility], [address], [place], [place] [address]     Today   Subjective    Alexine Pilant today has no new complaints, voiding well -Eating and drinking  well -No fever no chills no nausea no vomiting        Patient has been seen and examined prior to discharge   Objective   Blood pressure 99/75, pulse (!) 109, temperature 98.8 F (37.1 C), resp.  rate 20, height 4' 11"  (1.499 m), weight 59.5 kg, SpO2 100 %.   Intake/Output Summary (Last 24 hours) at 10/21/2020 1149 Last data filed at 10/21/2020 0900 Gross per 24 hour  Intake 480 ml  Output 600 ml  Net -120 ml    Exam Gen:- Awake Alert, no acute distress  HEENT:- [place].AT, No sclera icterus Neck-Supple Neck,No JVD,.  Lungs-  CTAB , good air movement bilaterally  CV- S1, S2 normal, regular Abd-  +ve B.Sounds, Abd Soft, No tenderness,    Extremity/Skin:-Mostly resolved lower extremity edema,   good pulses Psych-affect is appropriate, oriented x3 Neuro-no new focal deficits, no tremors    Data Review   CBC w Diff:  Lab Results  Component Value Date   WBC 5.3 10/21/2020   HGB 10.7 (L) 10/21/2020   HCT 33.0 (L) 10/21/2020   PLT 310 10/21/2020   LYMPHOPCT 26 10/18/2020   MONOPCT 7 10/18/2020   EOSPCT 0 10/18/2020   BASOPCT 1 10/18/2020    CMP:  Lab Results  Component Value Date   NA 136 10/21/2020   NA 136 10/21/2020   K 4.3 10/21/2020   K 4.3 10/21/2020   CL 100 10/21/2020   CL 100 10/21/2020   CO2 29 10/21/2020   CO2 29 10/21/2020   BUN 28 (H) 10/21/2020   BUN 28 (H) 10/21/2020   CREATININE 0.98 10/21/2020   CREATININE 0.94 10/21/2020   CREATININE 0.69 07/08/2012   PROT 7.2 10/18/2020   ALBUMIN 2.1 (L) 10/21/2020   ALBUMIN 2.1 (L) 10/21/2020   BILITOT 0.7 10/18/2020   ALKPHOS 126 10/18/2020   AST 78 (H) 10/18/2020   ALT 89 (H) 10/18/2020  .   Total Discharge time is about 33 minutes  Roxan Hockey M.D on 10/21/2020 at 11:49 AM  Go to [email or web address] -  for contact info  [facility] - Office  [phone]

## 2020-10-21 NOTE — Discharge Instructions (Signed)
1)insulin aspart (novoLOG) injection 4-16 Units -4-16 Units Subcutaneous, 3 times daily with meals CBG < 70: -eat/drink something CBG 70 - 120: 0 unit CBG 121 - 150: 0 unit  CBG 151 - 200: 4 unit CBG 201 - 250: 6 units CBG 251 - 300: 9 units CBG 301 - 350: 12 units  CBG 351 - 400: 14 units  CBG > 400: 16 units  2) please keep your appointment with the primary care physician as advised  3)Avoid ibuprofen/Advil/Aleve/Motrin/Goody Powders/Naproxen/BC powders/Meloxicam/Diclofenac/Indomethacin and other Nonsteroidal anti-inflammatory medications as these will make you more likely to bleed and can cause stomach ulcers, can also cause Kidney problems.

## 2020-10-21 NOTE — TOC Transition Note (Signed)
Transition of Care [facility]) - CM/SW Discharge Note   Patient Details  Name: Diamond [name] MRN: [id number] Date of Birth: 1996-06-26  [facility] CM/SW Contact:  Natasha Bence, LCSW Phone Number: 10/21/2020, 3:30 PM   Clinical Narrative:    CSW spoke with patient about ability to obtain insulin medication post discharge. Patient reported that she is able to obtain her medication and utilized the med assistance program. Patient reported that there was not a fee for her to obtain her insulin. TOC signing off.      Barriers to Discharge: Continued Medical Work up   Patient Goals and CMS Choice        Discharge Placement                       Discharge Plan and Services                                     Social Determinants of Health (SDOH) Interventions     Readmission Risk Interventions No flowsheet data found.

## 2020-11-01 ENCOUNTER — Encounter: Payer: Self-pay | Admitting: Physician Assistant

## 2020-11-01 ENCOUNTER — Ambulatory Visit: Payer: Medicaid Other | Admitting: "Endocrinology

## 2020-11-01 ENCOUNTER — Ambulatory Visit (INDEPENDENT_AMBULATORY_CARE_PROVIDER_SITE_OTHER): Payer: Self-pay | Admitting: "Endocrinology

## 2020-11-01 ENCOUNTER — Encounter: Payer: Self-pay | Admitting: "Endocrinology

## 2020-11-01 ENCOUNTER — Other Ambulatory Visit: Payer: Self-pay

## 2020-11-01 ENCOUNTER — Ambulatory Visit: Payer: Medicaid Other | Admitting: Physician Assistant

## 2020-11-01 VITALS — Temp 98.8°F

## 2020-11-01 VITALS — BP 170/123 | HR 109 | Ht 59.0 in | Wt 121.0 lb

## 2020-11-01 DIAGNOSIS — Z9119 Patient's noncompliance with other medical treatment and regimen: Secondary | ICD-10-CM

## 2020-11-01 DIAGNOSIS — E1065 Type 1 diabetes mellitus with hyperglycemia: Secondary | ICD-10-CM

## 2020-11-01 DIAGNOSIS — R112 Nausea with vomiting, unspecified: Secondary | ICD-10-CM

## 2020-11-01 DIAGNOSIS — R197 Diarrhea, unspecified: Secondary | ICD-10-CM

## 2020-11-01 DIAGNOSIS — I10 Essential (primary) hypertension: Secondary | ICD-10-CM | POA: Insufficient documentation

## 2020-11-01 DIAGNOSIS — Z91199 Patient's noncompliance with other medical treatment and regimen due to unspecified reason: Secondary | ICD-10-CM

## 2020-11-01 MED ORDER — INSULIN LISPRO (1 UNIT DIAL) 100 UNIT/ML (KWIKPEN)
10.0000 [IU] | PEN_INJECTOR | Freq: Three times a day (TID) | SUBCUTANEOUS | 2 refills | Status: DC
Start: 2020-11-01 — End: 2021-02-15

## 2020-11-01 MED ORDER — LISINOPRIL 20 MG PO TABS
20.0000 mg | ORAL_TABLET | Freq: Every day | ORAL | 1 refills | Status: DC
Start: 2020-11-01 — End: 2021-02-15

## 2020-11-01 MED ORDER — HYDROCHLOROTHIAZIDE 25 MG PO TABS: 25.0000 mg | ORAL_TABLET | Freq: Every day | ORAL | 1 refills | Status: DC

## 2020-11-01 MED ORDER — BASAGLAR KWIKPEN 100 UNIT/ML ~~LOC~~ SOPN: 44.0000 [IU] | PEN_INJECTOR | Freq: Every day | SUBCUTANEOUS | 2 refills | Status: DC

## 2020-11-01 MED ORDER — PROMETHAZINE HCL 25 MG PO TABS: 25.0000 mg | ORAL_TABLET | Freq: Three times a day (TID) | ORAL | 0 refills | Status: DC | PRN

## 2020-11-01 NOTE — Patient Instructions (Signed)
Vomiting, Adult Vomiting occurs when stomach contents are thrown up and out of the mouth. Many people notice nausea before vomiting. Vomiting can make you feel weak and cause you to become dehydrated. Dehydration can make you feel tired and thirsty, cause you to have a dry mouth, and decrease how often you urinate. Older adults and people who have other diseases or a weak body defense system (immune system) are at higher risk for dehydration. It is important to treat vomiting as told by your health care provider. Follow these instructions at home:  Eating and drinking     Follow these recommendations as told by your health care provider:  Take an oral rehydration solution (ORS). This is a drink that is sold at pharmacies and retail stores.  Eat bland, easy-to-digest foods in small amounts as you are able. These foods include bananas, applesauce, rice, lean meats, toast, and crackers.  Drink clear fluids slowly and in small amounts as you are able. Clear fluids include water, ice chips, low-calorie sports drinks, and fruit juice that has water added (diluted fruit juice).  Avoid drinking fluids that contain a lot of sugar or caffeine, such as energy drinks, sports drinks, and soda.  Avoid alcohol.  Avoid spicy or fatty foods.  General instructions  Wash your hands often using soap and water. If soap and water are not available, use hand sanitizer. Make sure that everyone in your household washes their hands frequently.  Take over-the-counter and prescription medicines only as told by your health care provider.  Rest at home while you recover.  Watch your condition for any changes.  Keep all follow-up visits as told by your health care provider. This is important. Contact a health care provider if:  Your vomiting gets worse.  You have new symptoms.  You have a fever.  You cannot drink fluids without vomiting.  You feel light-headed or dizzy.  You have a headache.  You  have muscle cramps.  You have a rash.  You have pain while urinating. Get help right away if:  You have pain in your chest, neck, arm, or jaw.  You feel extremely weak or you faint.  You have persistent vomiting.  You have vomit that is bright red or looks like black coffee grounds.  You have stools that are bloody or black, or stools that look like tar.  You have a severe headache, a stiff neck, or both.  You have severe pain, cramping, or bloating in your abdomen.  You have trouble breathing or you are breathing very quickly.  Your heart is beating very quickly.  Your skin feels cold and clammy.  You feel confused.  You have signs of dehydration, such as: ? Dark urine, very little urine, or no urine. ? Cracked lips. ? Dry mouth. ? Sunken eyes. ? Sleepiness. ? Weakness. These symptoms may represent a serious problem that is an emergency. Do not wait to see if the symptoms will go away. Get medical help right away. Call your local emergency services (911 in the [place] Do not drive yourself to the hospital. Summary  Vomiting occurs when stomach contents are thrown up and out of the mouth. Vomiting can cause you to become dehydrated. Older adults and people who have other diseases or a weak immune system are at higher risk for dehydration.  It is important to treat vomiting as told by your health care provider. Follow your health care provider's instructions about eating and drinking.  Wash your hands often using  soap and water. If soap and water are not available, use hand sanitizer. Make sure that everyone in your household washes their hands frequently.  Watch your condition for any changes and for signs of dehydration.  Keep all follow-up visits as told by your health care provider. This is important. This information is not intended to replace advice given to you by your health care provider. Make sure you discuss any questions you have with your health care  provider. Document Revised: 05/08/2019 Document Reviewed: 04/29/2018 Elsevier Patient Education  [place].

## 2020-11-01 NOTE — Progress Notes (Signed)
11/01/2020, 1:05 PM  Endocrinology follow-up note   Subjective:    Patient ID: Diamond [name], female    DOB: [birth year].  Diamond [name] is being seen in follow-up after she was seen in consultation for management of currently uncontrolled symptomatic type I diabetes. She is accompanied by her aide who is assisting her with her monitoring and insulin administration activities.    Past Medical History:  Diagnosis Date  . ADHD (attention deficit hyperactivity disorder) 2007  . Allergy   . Asthma   . Bipolar disorder ([place])   . Crohn's colitis ([place])    Dx Dr. Cherrie Gauze  . Depression    early childhood, trial of seroquel?  . Diabetic neuropathy ([place])   . GERD (gastroesophageal reflux disease)   . Hypertension   . Pancreatitis   . Panic attacks   . PTSD (post-traumatic stress disorder)   . Stroke ([place]) 07/2020   R side  . Type 1 diabetes ([place]) 2008    Past Surgical History:  Procedure Laterality Date  . ADENOIDECTOMY    . HERNIA REPAIR    . TONSILLECTOMY      Social History   Socioeconomic History  . Marital status: Single    Spouse name: Not on file  . Number of children: Not on file  . Years of education: Not on file  . Highest education level: Not on file  Occupational History  . Not on file  Tobacco Use  . Smoking status: Former Smoker    Types: Cigarettes    Quit date: 12/03/2008    Years since quitting: 11.9  . Smokeless tobacco: Never Used  Vaping Use  . Vaping Use: Never used  Substance and Sexual Activity  . Alcohol use: No  . Drug use: No  . Sexual activity: Yes  Other Topics Concern  . Not on file  Social History Narrative  . Not on file   Social Determinants of Health   Financial Resource Strain:   . Difficulty of Paying Living Expenses: Not on file  Food Insecurity:   . Worried About Charity fundraiser in the Last Year: Not on file  .  Ran Out of Food in the Last Year: Not on file  Transportation Needs:   . Lack of Transportation (Medical): Not on file  . Lack of Transportation (Non-Medical): Not on file  Physical Activity:   . Days of Exercise per Week: Not on file  . Minutes of Exercise per Session: Not on file  Stress:   . Feeling of Stress : Not on file  Social Connections:   . Frequency of Communication with Friends and Family: Not on file  . Frequency of Social Gatherings with Friends and Family: Not on file  . Attends Religious Services: Not on file  . Active Member of Clubs or Organizations: Not on file  . Attends Archivist Meetings: Not on file  . Marital Status: Not on file    Family History  Problem Relation Age of Onset  . Hypertension Mother   . Diabetes Mother   . Heart disease Mother   .  Crohn's disease Father   . Alcohol abuse Father   . Drug abuse Father   . Mental illness Father   . Cancer Father   . Hypertension Maternal Aunt   . Seizures Maternal Aunt   . Depression Maternal Aunt   . Asthma Maternal Uncle   . Hypertension Maternal Uncle   . Cancer Maternal Grandmother   . Diabetes Maternal Grandmother   . Mental illness Maternal Grandfather     Outpatient Encounter Medications as of 11/01/2020  Medication Sig  . albuterol (VENTOLIN HFA) 108 (90 Base) MCG/ACT inhaler Inhale 2 puffs into the lungs every 6 (six) hours as needed. For shortness of breath  . citalopram (CELEXA) 10 MG tablet Take 10 mg by mouth daily.  . furosemide (LASIX) 40 MG tablet Take 1 tablet (40 mg total) by mouth daily.  Marland Kitchen gabapentin (NEURONTIN) 100 MG capsule Take 1 capsule (100 mg total) by mouth daily.  . hydrochlorothiazide (HYDRODIURIL) 25 MG tablet Take 1 tablet (25 mg total) by mouth daily.  . Insulin Glargine (BASAGLAR KWIKPEN) 100 UNIT/ML Inject 44 Units into the skin at bedtime.  . insulin lispro (HUMALOG KWIKPEN) 100 UNIT/ML KwikPen Inject 10-16 Units into the skin 3 (three) times daily  before meals.  Marland Kitchen lisinopril (ZESTRIL) 20 MG tablet Take 1 tablet (20 mg total) by mouth daily.  . Multiple Vitamin (MULTIVITAMIN WITH MINERALS) TABS tablet Take 1 tablet by mouth daily.  . polyethylene glycol (MIRALAX / GLYCOLAX) 17 g packet Take 17 g by mouth daily as needed. (Patient not taking: Reported on 11/01/2020)  . potassium chloride (KLOR-CON) 10 MEQ tablet Take 1 tablet (10 mEq total) by mouth daily. Take While taking Lasix/furosemide (Patient not taking: Reported on 11/01/2020)  . promethazine (PHENERGAN) 25 MG tablet Take 1 tablet (25 mg total) by mouth every 8 (eight) hours as needed for nausea or vomiting.  . traZODone (DESYREL) 50 MG tablet Take 50 mg by mouth at bedtime.  . [DISCONTINUED] Insulin Glargine (BASAGLAR KWIKPEN) 100 UNIT/ML Inject 42 Units into the skin at bedtime.  . [DISCONTINUED] insulin lispro (HUMALOG KWIKPEN) 100 UNIT/ML KwikPen Inject 4-16 Units into the skin See admin instructions. insulin aspart (novoLOG) injection 4-16 Units -4-16 Units Subcutaneous, 3 times daily with meals CBG < 70: -eat/drink something CBG 70 - 120: 0 unit CBG 121 - 150: 0 unit  CBG 151 - 200: 4 unit CBG 201 - 250: 6 units CBG 251 - 300: 9 units CBG 301 - 350: 12 units  CBG 351 - 400: 14 units  CBG > 400: 16 units  . [DISCONTINUED] lisinopril (ZESTRIL) 10 MG tablet Take 1 tablet (10 mg total) by mouth daily.   No facility-administered encounter medications on file as of 11/01/2020.    ALLERGIES: Allergies  Allergen Reactions  . Amoxicillin Hives    Did it involve swelling of the face/tongue/throat, SOB, or low BP? no  Did it involve sudden or severe rash/hives, skin peeling, or any reaction on the inside of your mouth or nose? no Did you need to seek medical attention at a hospital or doctor's office? no When did it last happen?unk If all above answers are "NO", may proceed with cephalosporin use.  Marland Kitchen Penicillins Hives and Itching    Did it involve swelling of the  face/tongue/throat, SOB, or low BP? no  Did it involve sudden or severe rash/hives, skin peeling, or any reaction on the inside of your mouth or nose? no Did you need to seek medical attention at a hospital  or doctor's office? no When did it last happen?unk If all above answers are "NO", may proceed with cephalosporin use. Did it involve swelling of the face/tongue/throat, SOB, or low BP? no  Did it involve sudden or severe rash/hives, skin peeling, or any reaction on the inside of your mouth or nose? no Did you need to seek medical attention at a hospital or doctor's office? no When did it last happen?unk If all above answers are "NO", may proceed with cephalosporin use. Tolerated keflex May 2021 with no issue.   . Augmentin [Amoxicillin-Pot Clavulanate] Other (See Comments)    Reaction unknown  . Ceftriaxone Sodium In Dextrose   . Clavulanic Acid   . Adhesive [Tape] Itching and Rash  . Latex Itching and Rash  . Other Itching and Rash  . Pineapple Rash    Rash on tongue and throat    VACCINATION STATUS: Immunization History  Administered Date(s) Administered  . HPV Quadrivalent 08/27/2012  . Hepatitis A 08/27/2012  . Influenza Split 08/27/2012  . Meningococcal Conjugate 08/27/2012  . Moderna SARS-COVID-2 Vaccination 03/02/2020, 03/29/2020    Diabetes She presents for her follow-up diabetic visit. She has type 1 diabetes mellitus. Onset time: She was diagnosed at approximate age of 24 years. Her disease course has been improving. Pertinent negatives for hypoglycemia include no confusion, headaches, pallor or seizures. Associated symptoms include blurred vision, fatigue, foot paresthesias, foot ulcerations, polydipsia and polyuria. Pertinent negatives for diabetes include no chest pain and no polyphagia. There are no hypoglycemic complications. Symptoms are improving. Diabetic complications include peripheral neuropathy. (Lack of adequate health insurance.   Noncompliance/nonengagement.) Risk factors for coronary artery disease include diabetes mellitus, family history, sedentary lifestyle and tobacco exposure. Current diabetic treatment includes insulin injections. She is compliant with treatment some of the time. Her weight is increasing steadily (She has gained 20 pounds since September 27, 2020, largely driven by anasarca.  ). She is following a generally unhealthy diet. When asked about meal planning, she reported none. She has not had a previous visit with a dietitian. She never participates in exercise. Her home blood glucose trend is decreasing steadily. Her breakfast blood glucose range is generally 180-200 mg/dl. Her lunch blood glucose range is generally 180-200 mg/dl. Her dinner blood glucose range is generally 180-200 mg/dl. Her bedtime blood glucose range is generally 180-200 mg/dl. Her overall blood glucose range is 180-200 mg/dl. (She presents with fluctuating glycemic profile, no hypoglycemia.  She did not log her insulin administration records.    Since her last visit, she has documented better glycemic profile.  She was discharged from [facility] after echocardiogram and abdominal ultrasound revealing no clear etiology for anasarca.  She does have hypoalbuminemia.  She recently showed up with  even higher A1c of greater than 15%.  She was given a new plan to treat type 1 diabetes with multiple daily injections of insulin.  She is accompanied by her aide from integrative medicine.    She denies any recent hypoglycemic episodes.    ) An ACE inhibitor/angiotensin II receptor blocker is being taken.     Review of Systems  Constitutional: Positive for fatigue. Negative for chills, fever and unexpected weight change.  HENT: Negative for trouble swallowing and voice change.   Eyes: Positive for blurred vision. Negative for visual disturbance.  Respiratory: Negative for cough, shortness of breath and wheezing.   Cardiovascular: Positive  for leg swelling. Negative for chest pain and palpitations.  Gastrointestinal: Negative for diarrhea, nausea and vomiting.  Endocrine: Positive for polydipsia and polyuria. Negative for cold intolerance, heat intolerance and polyphagia.  Musculoskeletal: Negative for arthralgias and myalgias.  Skin: Negative for color change, pallor, rash and wound.  Neurological: Negative for seizures and headaches.  Psychiatric/Behavioral: Negative for confusion and suicidal ideas.    Objective:    Vitals with BMI 11/01/2020 10/21/2020 10/21/2020  Height 4' 11"  - -  Weight 121 lbs - -  BMI 33.35 - -  Systolic 456 93 99  Diastolic 256 77 75  Pulse 109 114 109    BP (!) 170/123   Pulse (!) 109   Ht 4' 11"  (1.499 m)   Wt 121 lb (54.9 kg)   BMI 24.44 kg/m   Wt Readings from Last 3 Encounters:  11/01/20 121 lb (54.9 kg)  10/19/20 131 lb 2.8 oz (59.5 kg)  10/18/20 135 lb 6.4 oz (61.4 kg)     Physical Exam Constitutional:      Appearance: She is well-developed.     Comments: Patient with anasarca, weight gain of 20 pounds in 3 weeks.  HENT:     Head: Normocephalic and atraumatic.  Neck:     Thyroid: No thyromegaly.     Trachea: No tracheal deviation.  Cardiovascular:     Comments: Distant heart sounds. Pulmonary:     Effort: Pulmonary effort is normal.     Comments: Tight chest with poor air entry. Abdominal:     Tenderness: There is no abdominal tenderness. There is no guarding.  Musculoskeletal:        General: Swelling present. Normal range of motion.     Cervical back: Normal range of motion and neck supple.     Right lower leg: Edema present.     Left lower leg: Edema present.  Skin:    General: Skin is warm and dry.     Coloration: Skin is not pale.     Findings: No erythema or rash.  Neurological:     Mental Status: She is alert and oriented to person, place, and time.     Cranial Nerves: No cranial nerve deficit.     Coordination: Coordination normal.     Deep Tendon  Reflexes: Reflexes are normal and symmetric.  Psychiatric:     Comments: Patient is very reluctant.  She displays unconcerned affect.     CMP ( most recent) CMP     Component Value Date/Time   NA 136 10/21/2020 0554   NA 136 10/21/2020 0554   K 4.3 10/21/2020 0554   K 4.3 10/21/2020 0554   CL 100 10/21/2020 0554   CL 100 10/21/2020 0554   CO2 29 10/21/2020 0554   CO2 29 10/21/2020 0554   GLUCOSE 113 (H) 10/21/2020 0554   GLUCOSE 111 (H) 10/21/2020 0554   BUN 28 (H) 10/21/2020 0554   BUN 28 (H) 10/21/2020 0554   CREATININE 0.98 10/21/2020 0554   CREATININE 0.94 10/21/2020 0554   CREATININE 0.69 07/08/2012 1203   CALCIUM 8.7 (L) 10/21/2020 0554   CALCIUM 8.7 (L) 10/21/2020 0554   PROT 7.2 10/18/2020 1212   ALBUMIN 2.1 (L) 10/21/2020 0554   ALBUMIN 2.1 (L) 10/21/2020 0554   AST 78 (H) 10/18/2020 1212   ALT 89 (H) 10/18/2020 1212   ALKPHOS 126 10/18/2020 1212   BILITOT 0.7 10/18/2020 1212   GFRNONAA >60 10/21/2020 0554   GFRNONAA >60 10/21/2020 0554   GFRAA >60 08/26/2020 0507    Diabetic Labs (most recent): Lab Results  Component Value Date   HGBA1C >  15.5 (H) 08/25/2020   HGBA1C 14.0 02/03/2020   HGBA1C 13.2 03/20/2017    Lab Results  Component Value Date   TSH 1.078 08/25/2020   TSH 1.181 07/08/2012      Assessment & Plan:   1. Uncontrolled type 1 diabetes mellitus with hyperglycemia (HCC) - JASIAH BUNTIN has currently uncontrolled symptomatic type 1 DM since  24 years of age. Her work-up in the hospital was negative for CHF, or ascites.  She has hypoalbuminemia.   Since her last visit, with the help of her aide from integrative medicine, she has followed her insulin regimen, and documented better glycemic profile.  She was discharged from [facility] after echocardiogram and abdominal ultrasound revealing no clear etiology for anasarca.  She does have hypoalbuminemia.  She recently showed up with  even higher A1c of greater than 15%.  She was  given a new plan to treat type 1 diabetes with multiple daily injections of insulin.  She is accompanied by her aide from integrative medicine.    She denies any recent hypoglycemic episodes.    - Recent labs reviewed. - I had a long discussion with her about the progressive nature of diabetes and the pathology behind its complications. -her diabetes is complicated by chronic nonadherence/noncompliance, peripheral neuropathy and she remains at exceedingly  high risk for more acute and chronic complications which include CAD, CVA, CKD, retinopathy, and neuropathy. These are all discussed in detail with her.  - I have counseled her on diet management  by adopting a carbohydrate restricted/protein rich diet. Patient is encouraged to switch to  unprocessed or minimally processed     complex starch and increased protein intake (animal or plant source), fruits, and vegetables.  -  she is advised to stick to a routine mealtimes to eat 3 meals  a day and avoid unnecessary snacks ( to snack only to correct hypoglycemia).  - she  admits there is a room for improvement in her diet and drink choices. -  Suggestion is made for her to avoid simple carbohydrates  from her diet including Cakes, Sweet Desserts / Pastries, Ice Cream, Soda (diet and regular), Sweet Tea, Candies, Chips, Cookies, Sweet Pastries,  Store Bought Juices, Alcohol in Excess of  1-2 drinks a day, Artificial Sweeteners, Coffee Creamer, and "Sugar-free" Products. This will help patient to have stable blood glucose profile and potentially avoid unintended weight gain.   - she has been scheduled with Jearld Fenton, RDN, CDE for diabetes education.  She did not keep her follow-up.  - I have reapproached her with the following individualized plan to manage  her diabetes and patient agrees:   - she has an aide from integrative medicine who promises to offer her help.   -She needs nutritional rehabilitation with increased protein intake.  She is  advised to increase her Basaglar to 44 units nightly, continue Humalog  10 units 3 times a day with meals  for pre-meal BG readings of 90-133m/dl, plus patient specific correction dose for unexpected hyperglycemia above 1539mdl, associated with strict monitoring of glucose 4 times a day-before meals and at bedtime. - she is warned not to take insulin without proper monitoring per orders. - Adjustment parameters are given to her for hypo and hyperglycemia in writing. - she is encouraged to call clinic for blood glucose levels less than 70 or above 300 mg /dl.  - she is not a candidate for metformin, SGLT2 inhibitors, nor incretin therapy.   - Specific targets for  A1c;  LDL, HDL,  and Triglycerides were discussed with the patient.  2) Blood Pressure /Hypertension: Her blood pressure is not controlled, repeat measurement shows 163/123.  I discussed and increase her lisinopril to 20 mg daily, added hydrochlorothiazide 25 mg p.o. daily, continue Lasix as needed.  She is advised to continue monitoring blood pressure at home and report if they are greater than 160/90.     3) Lipids/Hyperlipidemia: She does not have any lipid panel to review.     She will be considered for fasting lipid panel on subsequent visits.   4)  Weight/Diet:  Body mass index is 24.44 kg/m.  -She is not a candidate for weight loss.  Exercise, and detailed carbohydrates information provided  -  detailed on discharge instructions.  5) Chronic Care/Health Maintenance:  -she  is not  on ACEI/ARB and Statin medications and  is encouraged to initiate and continue to follow up with Ophthalmology, Dentist,  Podiatrist at least yearly or according to recommendations, and advised to   stay away from smoking. I have recommended yearly flu vaccine and pneumonia vaccine at least every 5 years; moderate intensity exercise for up to 150 minutes weekly; and  sleep for at least 7 hours a day.  -She is advised to increase her Lasix to 40 mg  p.o. every morning.  This patient is in desperate need for social worker to assist with her social life.  She has significant social constraints including lack of health  insurance which are interfering with her diabetes care.  - she is  advised to maintain close follow up with her PCP for primary care needs, as well as her other providers for optimal and coordinated care.  - Time spent on this patient care encounter:  35 min, of which > 50% was spent in  counseling and the rest reviewing her blood glucose logs , discussing her hypoglycemia and hyperglycemia episodes, reviewing her current and  previous labs / studies  ( including abstraction from other facilities) and medications  doses and developing a  long term treatment plan and documenting her care.   Please refer to Patient Instructions for Blood Glucose Monitoring and Insulin/Medications Dosing Guide"  in media tab for additional information. Please  also refer to " Patient Self Inventory" in the Media  tab for reviewed elements of pertinent patient history.  [name] participated in the discussions, expressed understanding, and voiced agreement with the above plans.  All questions were answered to her satisfaction. she is encouraged to contact clinic should she have any questions or concerns prior to her return visit.   Follow up plan: - Return in about 5 weeks (around 12/06/2020), or Call back if Blood Pressure is above 160/90, for Bring Meter and Logs- A1c in Office.  Glade Lloyd, MD [facility] [address] [place],  [address] Phone: [phone]  Fax: [phone]    11/01/2020, 1:05 PM  This note was partially dictated with voice recognition software. Similar sounding words can be transcribed inadequately or may not  be corrected upon review.

## 2020-11-01 NOTE — Progress Notes (Signed)
There were no vitals taken for this visit.   Subjective:    Patient ID: Diamond [name], female    DOB: [birth year], 24 y.o.   MRN: [id number]  HPI: Diamond [name] is a 24 y.o. female presenting on 11/01/2020 for No chief complaint on file.   HPI    Pt had a negative covid 19 screening questionnaire.     Pt appointment for N/V/D.  Last emesis about 8:30 today.  Last diarrhea 6am today.   She says the diarrhea is like water.  No blood in the diarrhea.   She says it feels like there is something inside her scratching her inside.    V/D started Sunday.  She says her cousin's baby has diarrhea but although they live in the same house, she isn't around the baby.    She got out of the hospital 10/21/20.   She is eating.  She ate oatmeal last night for dinner.   It made her throw up after she ate.  She is able to keep down sips of water.    She doesn't have a thermometer so she doesn't know if she has had a fever.   LMP April.  She uses nothing for birth control.  She is not sexually active in the past 3 years.    She has abd pain when she is not having the vomiting and diarrhea.    She says her edema is improved.  She is eating more protein.  Last time she checked her bs .  This am it was 365.     Pt did not bring her medications and doesn't know what she is taking except to say that nothing has changed.      Relevant past medical, surgical, family and social history reviewed and updated as indicated. Interim medical history since our last visit reviewed. Allergies and medications reviewed and updated.   Current Outpatient Medications:  .  albuterol (VENTOLIN HFA) 108 (90 Base) MCG/ACT inhaler, Inhale 2 puffs into the lungs every 6 (six) hours as needed. For shortness of breath, Disp: 18 g, Rfl: 1 .  citalopram (CELEXA) 10 MG tablet, Take 10 mg by mouth daily., Disp: , Rfl:  .  furosemide (LASIX) 40 MG tablet, Take 1 tablet (40 mg total) by mouth daily., Disp: 90 tablet,  Rfl: 3 .  gabapentin (NEURONTIN) 100 MG capsule, Take 1 capsule (100 mg total) by mouth daily., Disp: 30 capsule, Rfl: 0 .  Insulin Glargine (BASAGLAR KWIKPEN) 100 UNIT/ML, Inject 42 Units into the skin at bedtime., Disp: 15 mL, Rfl: 5 .  insulin lispro (HUMALOG KWIKPEN) 100 UNIT/ML KwikPen, Inject 4-16 Units into the skin See admin instructions. insulin aspart (novoLOG) injection 4-16 Units -4-16 Units Subcutaneous, 3 times daily with meals CBG < 70: -eat/drink something CBG 70 - 120: 0 unit CBG 121 - 150: 0 unit  CBG 151 - 200: 4 unit CBG 201 - 250: 6 units CBG 251 - 300: 9 units CBG 301 - 350: 12 units  CBG 351 - 400: 14 units  CBG > 400: 16 units, Disp: 15 mL, Rfl: 11 .  lisinopril (ZESTRIL) 10 MG tablet, Take 1 tablet (10 mg total) by mouth daily., Disp: 90 tablet, Rfl: 2 .  Multiple Vitamin (MULTIVITAMIN WITH MINERALS) TABS tablet, Take 1 tablet by mouth daily., Disp: 30 tablet, Rfl: 5 .  polyethylene glycol (MIRALAX / GLYCOLAX) 17 g packet, Take 17 g by mouth daily as needed., Disp: 14 each, Rfl: 0 .  potassium chloride (KLOR-CON) 10 MEQ tablet, Take 1 tablet (10 mEq total) by mouth daily. Take While taking Lasix/furosemide, Disp: 30 tablet, Rfl: 2 .  traZODone (DESYREL) 50 MG tablet, Take 50 mg by mouth at bedtime., Disp: , Rfl:     Review of Systems  Per HPI unless specifically indicated above     Objective:      Orthostatic VS for the past 24 hrs:  BP- Lying Pulse- Lying BP- Sitting Pulse- Sitting BP- Standing at 0 minutes Pulse- Standing at 0 minutes  11/01/20 1038 (!) 149/107 113 (!) 143/99 111 127/82 109       There were no vitals taken for this visit.  Wt Readings from Last 3 Encounters:  10/19/20 131 lb 2.8 oz (59.5 kg)  10/18/20 135 lb 6.4 oz (61.4 kg)  10/12/20 123 lb 3.2 oz (55.9 kg)    Physical Exam Vitals reviewed.  Constitutional:      General: She is not in acute distress.    Appearance: She is well-developed.  HENT:     Head: Normocephalic and  atraumatic.  Cardiovascular:     Rate and Rhythm: Normal rate and regular rhythm.  Pulmonary:     Effort: Pulmonary effort is normal.     Breath sounds: Normal breath sounds.  Abdominal:     General: Bowel sounds are normal.     Palpations: Abdomen is soft. There is no mass.     Tenderness: There is no guarding or rebound.     Comments: Mild non point tenderness  Musculoskeletal:     Cervical back: Neck supple.     Right lower leg: No edema.     Left lower leg: No edema.     Comments: Edema significantly improved  Lymphadenopathy:     Cervical: No cervical adenopathy.  Skin:    General: Skin is warm and dry.  Neurological:     Mental Status: She is alert and oriented to person, place, and time.  Psychiatric:        Behavior: Behavior normal.             Assessment & Plan:    Encounter Diagnosis  Name Primary?  . Nausea vomiting and diarrhea Yes       -rx phenergan.  Pt encouraged to drink plenty fluids to help with dehydration. -she is to Call monday if the diarrhea persists.

## 2020-11-05 ENCOUNTER — Encounter (HOSPITAL_COMMUNITY): Payer: Self-pay

## 2020-11-05 ENCOUNTER — Other Ambulatory Visit: Payer: Self-pay

## 2020-11-05 ENCOUNTER — Emergency Department (HOSPITAL_COMMUNITY)
Admission: EM | Admit: 2020-11-05 | Discharge: 2020-11-05 | Disposition: A | Discharge status: Home / Self Care | Payer: Medicaid Other | Attending: Emergency Medicine | Admitting: Emergency Medicine

## 2020-11-05 DIAGNOSIS — J45909 Unspecified asthma, uncomplicated: Secondary | ICD-10-CM | POA: Insufficient documentation

## 2020-11-05 DIAGNOSIS — E1065 Type 1 diabetes mellitus with hyperglycemia: Secondary | ICD-10-CM | POA: Insufficient documentation

## 2020-11-05 DIAGNOSIS — R10819 Abdominal tenderness, unspecified site: Secondary | ICD-10-CM | POA: Insufficient documentation

## 2020-11-05 DIAGNOSIS — R739 Hyperglycemia, unspecified: Secondary | ICD-10-CM

## 2020-11-05 DIAGNOSIS — Z79899 Other long term (current) drug therapy: Secondary | ICD-10-CM | POA: Insufficient documentation

## 2020-11-05 DIAGNOSIS — K50919 Crohn's disease, unspecified, with unspecified complications: Secondary | ICD-10-CM

## 2020-11-05 DIAGNOSIS — Z87891 Personal history of nicotine dependence: Secondary | ICD-10-CM | POA: Insufficient documentation

## 2020-11-05 DIAGNOSIS — E1040 Type 1 diabetes mellitus with diabetic neuropathy, unspecified: Secondary | ICD-10-CM | POA: Insufficient documentation

## 2020-11-05 DIAGNOSIS — Z91040 Latex allergy status: Secondary | ICD-10-CM | POA: Insufficient documentation

## 2020-11-05 DIAGNOSIS — I10 Essential (primary) hypertension: Secondary | ICD-10-CM | POA: Insufficient documentation

## 2020-11-05 LAB — CBG MONITORING, ED
Glucose-Capillary: 535 mg/dL (ref 70–99)
Glucose-Capillary: 398 mg/dL — ABNORMAL HIGH (ref 70–99)
Glucose-Capillary: 423 mg/dL — ABNORMAL HIGH (ref 70–99)
Glucose-Capillary: 402 mg/dL — ABNORMAL HIGH (ref 70–99)
Glucose-Capillary: 335 mg/dL — ABNORMAL HIGH (ref 70–99)
Glucose-Capillary: 243 mg/dL — ABNORMAL HIGH (ref 70–99)

## 2020-11-05 LAB — CBC
HCT: 38 % (ref 36.0–46.0)
Hemoglobin: 13.1 g/dL (ref 12.0–15.0)
MCH: 29.5 pg (ref 26.0–34.0)
MCHC: 34.5 g/dL (ref 30.0–36.0)
MCV: 85.6 fL (ref 80.0–100.0)
Platelets: 367 10*3/uL (ref 150–400)
RBC: 4.44 MIL/uL (ref 3.87–5.11)
RDW: 11.9 % (ref 11.5–15.5)
WBC: 5.7 10*3/uL (ref 4.0–10.5)
nRBC: 0 % (ref 0.0–0.2)

## 2020-11-05 LAB — URINALYSIS, ROUTINE W REFLEX MICROSCOPIC
Bacteria, UA: NONE SEEN
Bilirubin Urine: NEGATIVE
Glucose, UA: >=500 mg/dL — AB
Ketones, ur: NEGATIVE mg/dL
Leukocytes,Ua: NEGATIVE
Nitrite: NEGATIVE
Protein, ur: 100 mg/dL — AB
Specific Gravity, Urine: 1.017 (ref 1.005–1.030)
pH: 7 (ref 5.0–8.0)

## 2020-11-05 LAB — BASIC METABOLIC PANEL
Anion gap: 10 (ref 5–15)
BUN: 20 mg/dL (ref 6–20)
CO2: 29 mmol/L (ref 22–32)
Calcium: 8.9 mg/dL (ref 8.9–10.3)
Chloride: 88 mmol/L — ABNORMAL LOW (ref 98–111)
Creatinine, Ser: 1.01 mg/dL — ABNORMAL HIGH (ref 0.44–1.00)
GFR, Estimated: >60 mL/min (ref >60)
Glucose, Bld: 590 mg/dL (ref 70–99)
Potassium: 3.9 mmol/L (ref 3.5–5.1)
Sodium: 127 mmol/L — ABNORMAL LOW (ref 135–145)

## 2020-11-05 LAB — MAGNESIUM: Magnesium: 2.3 mg/dL (ref 1.7–2.4)

## 2020-11-05 LAB — POC URINE PREG, ED: Preg Test, Ur: NEGATIVE

## 2020-11-05 MED ORDER — INSULIN REGULAR(HUMAN) IN NACL 100-0.9 UT/100ML-% IV SOLN
INTRAVENOUS | Status: DC
  Administered 2020-11-05: 7.5 [IU]/h via INTRAVENOUS
  Filled 2020-11-05: qty 100

## 2020-11-05 MED ORDER — LACTATED RINGERS IV SOLN: INTRAVENOUS | Status: DC

## 2020-11-05 MED ORDER — DEXTROSE 50 % IV SOLN: 0.0000 mL | INTRAVENOUS | Status: DC | PRN

## 2020-11-05 MED ORDER — DEXTROSE IN LACTATED RINGERS 5 % IV SOLN: INTRAVENOUS | Status: DC

## 2020-11-05 MED ORDER — ONDANSETRON 8 MG PO TBDP: 8.0000 mg | ORAL_TABLET | ORAL | Status: DC | PRN

## 2020-11-05 MED ORDER — ONDANSETRON HCL 4 MG/2ML IJ SOLN
4.0000 mg | Freq: Once | INTRAMUSCULAR | Status: AC
  Administered 2020-11-05: 4 mg via INTRAVENOUS
  Filled 2020-11-05: qty 2

## 2020-11-05 MED ORDER — PROMETHAZINE HCL 25 MG RE SUPP: 25.0000 mg | Freq: Four times a day (QID) | RECTAL | 0 refills | Status: DC | PRN

## 2020-11-05 MED ORDER — ONDANSETRON 8 MG PO TBDP: 8.0000 mg | ORAL_TABLET | Freq: Three times a day (TID) | ORAL | 0 refills | Status: DC | PRN

## 2020-11-05 MED ORDER — SODIUM CHLORIDE 0.9 % IV BOLUS
1000.0000 mL | Freq: Once | INTRAVENOUS | Status: AC
  Administered 2020-11-05: 1000 mL via INTRAVENOUS

## 2020-11-05 NOTE — Discharge Instructions (Signed)
You're seen in the ER for elevated blood sugar, nausea, vomiting, diarrhea.  As discussed, your labs are reassuring. We have hydrated you. We are sending you home with medications for nausea. We recommend that you follow-up with the GI doctor as planned in January. Keep taking your diabetes medications as prescribed  Please follow-up with your primary care doctor in 1 week. Please return to the ER if your symptoms worsen; you have increased pain, fevers, chills, inability to keep any medications down, confusion. Otherwise see the outpatient doctor as requested.

## 2020-11-05 NOTE — ED Triage Notes (Signed)
Pt verbalized she went to Dr. about Jorge Ny and was told she had a stomach virus.

## 2020-11-05 NOTE — ED Triage Notes (Signed)
Pt verbalized she hit the back of her head when she fell. Pt stated she was light headed and dizzy and fell. Blood sugar is 535. Pt c/o n/v, diarrhea, weakness and abdominal pain. Pt came from home on [facility]. Vomit x 4 today, pt verbalized watery stool x 1 week.

## 2020-11-05 NOTE — ED Provider Notes (Addendum)
[facility] EMERGENCY DEPARTMENT Provider Note   CSN: [id number] Arrival date & time: 11/05/20  1450     History Chief Complaint  Patient presents with  . Hyperglycemia    Diamond [name] is a 24 y.o. female.  HPI     23 year old female comes in a chief complaint of elevated blood sugar.  She has history of Crohn's, type 1 diabetes.  Patient reports that she has been feeling unwell for the last 1 week with symptoms of nausea, vomiting, diarrhea.  She is unsure what the underlying causes.  She has mild abdominal discomfort with it.  Her emesis is yellow.  She gets about 4-5 loose bowel movements.  She denies any UTI-like symptoms, new back pain.  Review of system is negative for fevers, chills.  Patient indicates that her blood sugar was fine until today when it started rising.  She has been taking her medications as prescribed.  Past Medical History:  Diagnosis Date  . ADHD (attention deficit hyperactivity disorder) 2007  . Allergy   . Asthma   . Bipolar disorder ([place])   . Crohn's colitis ([place])    Dx Dr. Cherrie Gauze  . Depression    early childhood, trial of seroquel?  . Diabetic neuropathy ([place])   . GERD (gastroesophageal reflux disease)   . Hypertension   . Pancreatitis   . Panic attacks   . PTSD (post-traumatic stress disorder)   . Stroke ([place]) 07/2020   R side  . Type 1 diabetes ([place]) 2008    Patient Active Problem List   Diagnosis Date Noted  . Essential hypertension, benign 11/01/2020  . Edema 10/19/2020  . Anasarca 10/18/2020  . Hypoalbuminemia 10/18/2020  . Personal history of noncompliance with medical treatment, presenting hazards to health 10/12/2020  . Bilateral lower extremity edema 08/25/2020  . Pneumonia 08/25/2020  . Hyperglycemia 08/25/2020  . Eczema 10/23/2012  . Abdominal pain 08/27/2012  . GERD (gastroesophageal reflux disease) 07/25/2012  . Mood disorder ([place]) 07/09/2012  . Contraception management 07/09/2012  . History of  Crohn's disease 07/09/2012  . Uncontrolled type 1 diabetes mellitus with hyperglycemia ([place]) 07/08/2012  . Asthma, persistent controlled 07/08/2012    Past Surgical History:  Procedure Laterality Date  . ADENOIDECTOMY    . HERNIA REPAIR    . TONSILLECTOMY       OB History   No obstetric history on file.     Family History  Problem Relation Age of Onset  . Hypertension Mother   . Diabetes Mother   . Heart disease Mother   . Crohn's disease Father   . Alcohol abuse Father   . Drug abuse Father   . Mental illness Father   . Cancer Father   . Hypertension Maternal Aunt   . Seizures Maternal Aunt   . Depression Maternal Aunt   . Asthma Maternal Uncle   . Hypertension Maternal Uncle   . Cancer Maternal Grandmother   . Diabetes Maternal Grandmother   . Mental illness Maternal Grandfather     Social History   Tobacco Use  . Smoking status: Former Smoker    Types: Cigarettes    Quit date: 12/03/2008    Years since quitting: 11.9  . Smokeless tobacco: Never Used  Vaping Use  . Vaping Use: Never used  Substance Use Topics  . Alcohol use: No  . Drug use: No    Home Medications Prior to Admission medications   Medication Sig Start Date End Date Taking? Authorizing Provider  albuterol (  VENTOLIN HFA) 108 (90 Base) MCG/ACT inhaler Inhale 2 puffs into the lungs every 6 (six) hours as needed. For shortness of breath 10/21/20  Yes Emokpae, Courage, MD  citalopram (CELEXA) 10 MG tablet Take 10 mg by mouth daily.   Yes [provider]  furosemide (LASIX) 40 MG tablet Take 1 tablet (40 mg total) by mouth daily. 10/21/20  Yes Emokpae, Courage, MD  gabapentin (NEURONTIN) 100 MG capsule Take 1 capsule (100 mg total) by mouth daily. 10/13/20  Yes [name], PA-C  hydrochlorothiazide (HYDRODIURIL) 25 MG tablet Take 1 tablet (25 mg total) by mouth daily. 11/01/20  Yes Nida, Marella Chimes, MD  Insulin Glargine (BASAGLAR KWIKPEN) 100 UNIT/ML Inject 44 Units into the skin  at bedtime. 11/01/20  Yes Nida, Marella Chimes, MD  insulin lispro (HUMALOG KWIKPEN) 100 UNIT/ML KwikPen Inject 10-16 Units into the skin 3 (three) times daily before meals. 11/01/20  Yes Nida, Marella Chimes, MD  lisinopril (ZESTRIL) 20 MG tablet Take 1 tablet (20 mg total) by mouth daily. 11/01/20  Yes Nida, Marella Chimes, MD  Multiple Vitamin (MULTIVITAMIN WITH MINERALS) TABS tablet Take 1 tablet by mouth daily. 10/22/20  Yes Roxan Hockey, MD  promethazine (PHENERGAN) 25 MG tablet Take 1 tablet (25 mg total) by mouth every 8 (eight) hours as needed for nausea or vomiting. 11/01/20  Yes [name], PA-C  traZODone (DESYREL) 50 MG tablet Take 50 mg by mouth at bedtime.   Yes [provider]  polyethylene glycol (MIRALAX / GLYCOLAX) 17 g packet Take 17 g by mouth daily as needed. Patient not taking: Reported on 11/01/2020 10/21/20   Roxan Hockey, MD  potassium chloride (KLOR-CON) 10 MEQ tablet Take 1 tablet (10 mEq total) by mouth daily. Take While taking Lasix/furosemide Patient not taking: Reported on 11/01/2020 10/21/20   Roxan Hockey, MD    Allergies    Amoxicillin, Dexamethasone, Doxycycline, Penicillins, Augmentin [amoxicillin-pot clavulanate], Ceftriaxone sodium in dextrose, Clavulanic acid, Adhesive [tape], Latex, Other, and Pineapple  Review of Systems   Review of Systems  Constitutional: Positive for activity change. Negative for chills and fever.  Respiratory: Negative for shortness of breath.   Cardiovascular: Negative for chest pain.  Gastrointestinal: Positive for nausea and vomiting.  Genitourinary: Negative for dysuria.  Hematological: Does not bruise/bleed easily.  All other systems reviewed and are negative.   Physical Exam Updated Vital Signs BP (!) 123/96   Pulse (!) 108   Temp 99.1 F (37.3 C) (Oral)   Resp 12   Ht 4' 11"  (1.499 m)   Wt 54.9 kg   SpO2 99%   BMI 24.44 kg/m   Physical Exam Vitals and nursing note reviewed.   Constitutional:      Appearance: She is well-developed.  HENT:     Head: Normocephalic and atraumatic.  Eyes:     Pupils: Pupils are equal, round, and reactive to light.  Cardiovascular:     Rate and Rhythm: Normal rate and regular rhythm.     Heart sounds: Normal heart sounds. No murmur heard.   Pulmonary:     Effort: Pulmonary effort is normal. No respiratory distress.  Abdominal:     General: There is no distension.     Palpations: Abdomen is soft.     Tenderness: There is abdominal tenderness. There is no guarding or rebound.  Musculoskeletal:     Cervical back: Neck supple.  Skin:    General: Skin is warm and dry.  Neurological:     Mental Status: She is alert  and oriented to person, place, and time.     ED Results / Procedures / Treatments   Labs (all labs ordered are listed, but only abnormal results are displayed) Labs Reviewed  BASIC METABOLIC PANEL - Abnormal; Notable for the following components:      Result Value   Sodium 127 (*)    Chloride 88 (*)    Glucose, Bld 590 (*)    Creatinine, Ser 1.01 (*)    All other components within normal limits  URINALYSIS, ROUTINE W REFLEX MICROSCOPIC - Abnormal; Notable for the following components:   Color, Urine STRAW (*)    Glucose, UA >=500 (*)    Hgb urine dipstick SMALL (*)    Protein, ur 100 (*)    All other components within normal limits  CBG MONITORING, ED - Abnormal; Notable for the following components:   Glucose-Capillary 535 (*)    All other components within normal limits  CBG MONITORING, ED - Abnormal; Notable for the following components:   Glucose-Capillary 423 (*)    All other components within normal limits  CBG MONITORING, ED - Abnormal; Notable for the following components:   Glucose-Capillary 398 (*)    All other components within normal limits  CBG MONITORING, ED - Abnormal; Notable for the following components:   Glucose-Capillary 402 (*)    All other components within normal limits  CBG  MONITORING, ED - Abnormal; Notable for the following components:   Glucose-Capillary 335 (*)    All other components within normal limits  CBG MONITORING, ED - Abnormal; Notable for the following components:   Glucose-Capillary 243 (*)    All other components within normal limits  CBC  MAGNESIUM  POC URINE PREG, ED  POC URINE PREG, ED  CBG MONITORING, ED    EKG EKG Interpretation  Date/Time:  Saturday November 05 2020 15:07:52 EST Ventricular Rate:  107 PR Interval:    QRS Duration: 82 QT Interval:  337 QTC Calculation: 450 R Axis:   100 Text Interpretation: Sinus tachycardia Probable left atrial enlargement Borderline right axis deviation Abnormal Q suggests anterior infarct Nonspecific T abnormalities, inferior leads inferior leads TWI is new Confirmed by Varney Biles [phone]) on 11/05/2020 4:11:46 PM   Radiology No results found.  Procedures .Critical Care Performed by: Varney Biles, MD Authorized by: Varney Biles, MD   Critical care provider statement:    Critical care time (minutes):  32   Critical care was necessary to treat or prevent imminent or life-threatening deterioration of the following conditions:  Metabolic crisis (Hypoglycemia, requiring IV insulin, multiple reassessments.)   Critical care was time spent personally by me on the following activities:  Discussions with consultants, evaluation of patient's response to treatment, examination of patient, ordering and performing treatments and interventions, ordering and review of laboratory studies, ordering and review of radiographic studies, pulse oximetry, re-evaluation of patient's condition, obtaining history from patient or surrogate and review of old charts   (including critical care time)  Medications Ordered in ED Medications  insulin regular, human (MYXREDLIN) 100 units/ 100 mL infusion (2.4 Units/hr Intravenous Rate/Dose Change 11/05/20 2039)  lactated ringers infusion ( Intravenous Stopped  11/05/20 2040)  dextrose 5 % in lactated ringers infusion ( Intravenous New Bag/Given 11/05/20 1734)  dextrose 50 % solution 0-50 mL (has no administration in time range)  ondansetron (ZOFRAN-ODT) disintegrating tablet 8 mg (has no administration in time range)  sodium chloride 0.9 % bolus 1,000 mL (0 mLs Intravenous Stopped 11/05/20 1738)  ondansetron (ZOFRAN) injection  4 mg (4 mg Intravenous Given 11/05/20 1541)    ED Course  I have reviewed the triage vital signs and the nursing notes.  Pertinent labs & imaging results that were available during my care of the patient were reviewed by me and considered in my medical decision making (see chart for details).  Clinical Course as of Nov 06 2051  Sat Nov 05, 2020  1800 Hyperglycemia without DKA. Insulin initiated.  Glucose(!!): 590 [AN]  1800 White count is reassuring. No fevers here. Repeat abdominal exam unchanged. No CT indicated at this time  WBC: 5.7 [AN]  2051 Pt reassessed. Pt's VSS and WNL. Pt's cap refill < 3 seconds. Pt has been hydrated in the ER and now passed po challenge. We will discharge with antiemetic. Strict ER return precautions have been discussed and pt will return if he is unable to tolerate fluids and symptoms are getting worse.    [AN]  2051 Has her first GI follow-up coming up in January. Should help further to get her symptoms in better control. We will discharge her with ODT Zofran. She has been taking oral promethazine without success   [AN]    Clinical Course User Index [AN] Varney Biles, MD   MDM Rules/Calculators/A&P                          24 year old female comes in a chief complaint of elevated blood sugar.  She also has history of colitis and Crohn's disease, and has been having nausea, vomiting.  Her abdominal exam is reassuring.  Blood sugar is significantly elevated.  We will need to rule out DKA.  Underlying reason for her elevated blood sugar appears to be colitis.  Clinical suspicion for  underlying infectious process is low.  Colitis is likely secondary to Crohn's flareup.  Given the abdominal exam is reassuring, there is no fevers I do not think a CT scan is indicated at this time.  We will get basic labs and try to get patient's symptoms in better control.  Dispo per her lab results and reassessments.  Final Clinical Impression(s) / ED Diagnoses Final diagnoses:  Hyperglycemia  Crohn's disease with complication, unspecified gastrointestinal tract location [facility])    Rx / DC Orders ED Discharge Orders    None       Varney Biles, MD 11/05/20 [place], Juliona Vales, MD 11/05/20 2056

## 2020-11-05 NOTE — ED Notes (Signed)
CRITICAL VALUE ALERT  Critical Value:  Glucose 590  Date & Time Notied:  11/05/20 & 1630hrs  Provider Notified: Dr. Kathrynn Humble  Orders Received/Actions taken: notified

## 2020-11-07 ENCOUNTER — Telehealth: Payer: Self-pay | Admitting: Student

## 2020-11-07 NOTE — Telephone Encounter (Signed)
Pt's Paramedic Care taker called with concerns regarding pt. There is no ROI form signed for [facility] to speak to anyone but the patient.  LPN called pt to f/u. Pt states she feels HCTZ rx by Dr. Dorris Fetch is causing her dizziness and caused a syncope episode yesterday. Pt went to [facility] - ER.  PA recommends for pt to d/c HCTZ as her edema is most likely caused by protein deficiency. PA suspects pt's LOC was due to hyperglycemia. LPN notified pt and Pt verbalized understanding.  LN also informed pt that [facility] is unable to speak to anyone on her behalf unless and ROI is signed by her. Pt verbalized understanding.

## 2020-11-15 ENCOUNTER — Other Ambulatory Visit: Payer: Self-pay

## 2020-11-15 ENCOUNTER — Encounter: Payer: Self-pay | Admitting: Urology

## 2020-11-15 ENCOUNTER — Ambulatory Visit (INDEPENDENT_AMBULATORY_CARE_PROVIDER_SITE_OTHER): Payer: Medicaid Other | Admitting: Urology

## 2020-11-15 VITALS — BP 113/79 | HR 106 | Temp 98.6°F

## 2020-11-15 DIAGNOSIS — N320 Bladder-neck obstruction: Secondary | ICD-10-CM

## 2020-11-15 LAB — MICROSCOPIC EXAMINATION: Renal Epithel, UA: NONE SEEN /hpf

## 2020-11-15 LAB — URINALYSIS, ROUTINE W REFLEX MICROSCOPIC
Bilirubin, UA: NEGATIVE
Ketones, UA: NEGATIVE
Leukocytes,UA: NEGATIVE
Nitrite, UA: NEGATIVE
Specific Gravity, UA: 1.02 (ref 1.005–1.030)
Urobilinogen, Ur: 0.2 mg/dL (ref 0.2–1.0)
pH, UA: 6 (ref 5.0–7.5)

## 2020-11-15 LAB — BLADDER SCAN AMB NON-IMAGING: Scan Result: 35

## 2020-11-15 MED ORDER — TAMSULOSIN HCL 0.4 MG PO CAPS: 0.4000 mg | ORAL_CAPSULE | Freq: Every day | ORAL | 11 refills | Status: DC

## 2020-11-15 NOTE — Patient Instructions (Signed)
Acute Urinary Retention, Female  Acute urinary retention means that you cannot pee (urinate) at all, or that you pee too little and your bladder is not emptied completely. If it is not treated, it can lead to kidney damage or other serious problems. Follow these instructions at home:  Take over-the-counter and prescription medicines only as told by your doctor. Ask your doctor what medicines you should stay away from. Do not take any medicine unless your doctor says it is okay to do so.  If you were sent home with a tube that drains pee from the bladder (catheter), take care of it as told by your doctor.  Drink enough fluid to keep your pee clear or pale yellow.  If you were given an antibiotic, take it as told by your doctor. Do not stop taking the antibiotic even if you start to feel better.  Do not use any products that contain nicotine or tobacco, such as cigarettes and e-cigarettes. If you need help quitting, ask your doctor.  Watch for changes in your symptoms. Tell your doctor about them.  If told, keep track of any changes in your blood pressure at home. Tell your doctor about them.  Keep all follow-up visits as told by your doctor. This is important. Contact a doctor if:  You have spasms or you leak pee when you have spasms. Get help right away if:  You have chills or a fever.  You have blood in your pee.  You have a tube that drains the bladder and: ? The tube stops draining pee. ? The tube falls out. Summary  Acute urinary retention means that you cannot pee at all, or that you pee too little and your bladder is not emptied completely. If it is not treated, it can result in kidney damage or other serious problems.  If you were sent home with a tube that drains pee from the bladder, take care of it as told by your doctor.  Pay attention to any changes in your symptoms. Tell your doctor about them. This information is not intended to replace advice given to you by your  health care provider. Make sure you discuss any questions you have with your health care provider. Document Revised: 11/01/2017 Document Reviewed: 12/21/2016 Elsevier Patient Education  [place].

## 2020-11-15 NOTE — Progress Notes (Signed)
Urological Symptom Review  Patient is experiencing the following symptoms: Frequent urination Get up at night to urinate Stream starts and stops Urinary tract infection   Review of Systems  Gastrointestinal (upper)  : Nausea Vomiting  Gastrointestinal (lower) : Negative for lower GI symptoms  Constitutional : Negative for symptoms  Skin: Negative for skin symptoms  Eyes: Blurred vision  Ear/Nose/Throat : Negative for Ear/Nose/Throat symptoms  Hematologic/Lymphatic: Negative for Hematologic/Lymphatic symptoms  Cardiovascular : Leg swelling Chest pain  Respiratory : Negative for respiratory symptoms  Endocrine: Excessive thirst  Musculoskeletal: Back pain  Neurological: Headaches Dizziness  Psychologic: Depression Anxiety

## 2020-11-15 NOTE — Progress Notes (Signed)
11/15/2020 2:48 PM   Diamond [name] Mar 19, 1996 [id number]  Referring provider: [name], PA-C [address] [place],  [place] [address]  Urinary retention  HPI: Ms Tatsch is a 23yo here for evaluation of urinary retention. She had a foley placed at the time of a UTI in Oct 2021 and it was removed in Nov 2021. PVR today is 35cc. She has DMI. Creatine 0.9-1. She has to strain to start her urinary stream. She has incontinence 2x per week. She has nocturia 3-4x which are large volumes. She was getting UTIs earlier this year and took multiple courses of levaquin.   PMH: Past Medical History:  Diagnosis Date  . ADHD (attention deficit hyperactivity disorder) 2007  . Allergy   . Asthma   . Bipolar disorder ([place])   . Crohn's colitis ([place])    Dx Dr. Cherrie Gauze  . Depression    early childhood, trial of seroquel?  . Diabetic neuropathy ([place])   . GERD (gastroesophageal reflux disease)   . Hypertension   . Pancreatitis   . Panic attacks   . PTSD (post-traumatic stress disorder)   . Stroke ([place]) 07/2020   R side  . Type 1 diabetes ([place]) 2008    Surgical History: Past Surgical History:  Procedure Laterality Date  . ADENOIDECTOMY    . HERNIA REPAIR    . TONSILLECTOMY      Home Medications:  Allergies as of 11/15/2020      Reactions   Amoxicillin Hives   Did it involve swelling of the face/tongue/throat, SOB, or low BP? no  Did it involve sudden or severe rash/hives, skin peeling, or any reaction on the inside of your mouth or nose? no Did you need to seek medical attention at a hospital or doctor's office? no When did it last happen?unk If all above answers are "NO", may proceed with cephalosporin use.   Dexamethasone Hives, Itching   Doxycycline Itching, Swelling   Penicillins Hives, Itching   Did it involve swelling of the face/tongue/throat, SOB, or low BP? no  Did it involve sudden or severe rash/hives, skin peeling, or any reaction on the  inside of your mouth or nose? no Did you need to seek medical attention at a hospital or doctor's office? no When did it last happen?unk If all above answers are "NO", may proceed with cephalosporin use. Did it involve swelling of the face/tongue/throat, SOB, or low BP? no  Did it involve sudden or severe rash/hives, skin peeling, or any reaction on the inside of your mouth or nose? no Did you need to seek medical attention at a hospital or doctor's office? no When did it last happen?unk If all above answers are "NO", may proceed with cephalosporin use. Tolerated keflex May 2021 with no issue.   Augmentin [amoxicillin-pot Clavulanate] Other (See Comments)   Reaction unknown   Ceftriaxone Sodium In Dextrose    Clavulanic Acid    Adhesive [tape] Itching, Rash   Latex Itching, Rash   Other Itching, Rash   Pineapple Rash   Rash on tongue and throat      Medication List       Accurate as of November 15, 2020  2:48 PM. If you have any questions, ask your nurse or doctor.        STOP taking these medications   hydrochlorothiazide 25 MG tablet Commonly known as: HYDRODIURIL Stopped by: Nicolette Bang, MD     TAKE these medications   albuterol 108 (90 Base) MCG/ACT  inhaler Commonly known as: VENTOLIN HFA Inhale 2 puffs into the lungs every 6 (six) hours as needed. For shortness of breath   Basaglar KwikPen 100 UNIT/ML Inject 44 Units into the skin at bedtime.   citalopram 10 MG tablet Commonly known as: CELEXA Take 10 mg by mouth daily.   furosemide 40 MG tablet Commonly known as: LASIX Take 1 tablet (40 mg total) by mouth daily.   gabapentin 100 MG capsule Commonly known as: NEURONTIN Take 1 capsule (100 mg total) by mouth daily.   insulin lispro 100 UNIT/ML KwikPen Commonly known as: HumaLOG KwikPen Inject 10-16 Units into the skin 3 (three) times daily before meals.   lisinopril 20 MG tablet Commonly known as: ZESTRIL Take 1 tablet (20 mg total) by  mouth daily.   multivitamin with minerals Tabs tablet Take 1 tablet by mouth daily.   ondansetron 8 MG disintegrating tablet Commonly known as: Zofran ODT Take 1 tablet (8 mg total) by mouth every 8 (eight) hours as needed for nausea.   polyethylene glycol 17 g packet Commonly known as: MIRALAX / GLYCOLAX Take 17 g by mouth daily as needed.   potassium chloride 10 MEQ tablet Commonly known as: KLOR-CON Take 1 tablet (10 mEq total) by mouth daily. Take While taking Lasix/furosemide   promethazine 25 MG suppository Commonly known as: PHENERGAN Place 1 suppository (25 mg total) rectally every 6 (six) hours as needed for nausea.   traZODone 50 MG tablet Commonly known as: DESYREL Take 50 mg by mouth at bedtime.       Allergies:  Allergies  Allergen Reactions  . Amoxicillin Hives    Did it involve swelling of the face/tongue/throat, SOB, or low BP? no  Did it involve sudden or severe rash/hives, skin peeling, or any reaction on the inside of your mouth or nose? no Did you need to seek medical attention at a hospital or doctor's office? no When did it last happen?unk If all above answers are "NO", may proceed with cephalosporin use.  Marland Kitchen Dexamethasone Hives and Itching  . Doxycycline Itching and Swelling  . Penicillins Hives and Itching    Did it involve swelling of the face/tongue/throat, SOB, or low BP? no  Did it involve sudden or severe rash/hives, skin peeling, or any reaction on the inside of your mouth or nose? no Did you need to seek medical attention at a hospital or doctor's office? no When did it last happen?unk If all above answers are "NO", may proceed with cephalosporin use. Did it involve swelling of the face/tongue/throat, SOB, or low BP? no  Did it involve sudden or severe rash/hives, skin peeling, or any reaction on the inside of your mouth or nose? no Did you need to seek medical attention at a hospital or doctor's office? no When did it last  happen?unk If all above answers are "NO", may proceed with cephalosporin use. Tolerated keflex May 2021 with no issue.   . Augmentin [Amoxicillin-Pot Clavulanate] Other (See Comments)    Reaction unknown  . Ceftriaxone Sodium In Dextrose   . Clavulanic Acid   . Adhesive [Tape] Itching and Rash  . Latex Itching and Rash  . Other Itching and Rash  . Pineapple Rash    Rash on tongue and throat    Family History: Family History  Problem Relation Age of Onset  . Hypertension Mother   . Diabetes Mother   . Heart disease Mother   . Crohn's disease Father   . Alcohol abuse Father   .  Drug abuse Father   . Mental illness Father   . Cancer Father   . Hypertension Maternal Aunt   . Seizures Maternal Aunt   . Depression Maternal Aunt   . Asthma Maternal Uncle   . Hypertension Maternal Uncle   . Cancer Maternal Grandmother   . Diabetes Maternal Grandmother   . Mental illness Maternal Grandfather     Social History:  reports that she quit smoking about 11 years ago. Her smoking use included cigarettes. She has never used smokeless tobacco. She reports that she does not drink alcohol and does not use drugs.  ROS: All other review of systems were reviewed and are negative except what is noted above in HPI  Physical Exam: BP 113/79   Pulse (!) 106   Temp 98.6 F (37 C)   Constitutional:  Alert and oriented, No acute distress. HEENT: [place] AT, moist mucus membranes.  Trachea midline, no masses. Cardiovascular: No clubbing, cyanosis, or edema. Respiratory: Normal respiratory effort, no increased work of breathing. GI: Abdomen is soft, nontender, nondistended, no abdominal masses GU: No CVA tenderness.  Lymph: No cervical or inguinal lymphadenopathy. Skin: No rashes, bruises or suspicious lesions. Neurologic: Grossly intact, no focal deficits, moving all 4 extremities. Psychiatric: Normal mood and affect.  Laboratory Data: Lab Results  Component Value Date   WBC 5.7  11/05/2020   HGB 13.1 11/05/2020   HCT 38.0 11/05/2020   MCV 85.6 11/05/2020   PLT 367 11/05/2020    Lab Results  Component Value Date   CREATININE 1.01 (H) 11/05/2020    No results found for: PSA  No results found for: TESTOSTERONE  Lab Results  Component Value Date   HGBA1C >15.5 (H) 08/25/2020    Urinalysis    Component Value Date/Time   COLORURINE STRAW (A) 11/05/2020 1509   APPEARANCEUR CLEAR 11/05/2020 1509   LABSPEC 1.017 11/05/2020 1509   PHURINE 7.0 11/05/2020 1509   GLUCOSEU >=500 (A) 11/05/2020 1509   HGBUR SMALL (A) 11/05/2020 1509   BILIRUBINUR NEGATIVE 11/05/2020 1509   KETONESUR NEGATIVE 11/05/2020 1509   PROTEINUR 100 (A) 11/05/2020 1509   UROBILINOGEN 0.2 09/13/2012 2014   NITRITE NEGATIVE 11/05/2020 1509   LEUKOCYTESUR NEGATIVE 11/05/2020 1509    Lab Results  Component Value Date   BACTERIA NONE SEEN 11/05/2020    Pertinent Imaging:  Results for orders placed during the hospital encounter of 01/02/10  DG Abd 1 View  Narrative Clinical Data: 57-year-7-monthold female with left abdominal pain.  ABDOMEN - 1 VIEW  Comparison: 05/22/2009 and earlier.  Findings: Nonobstructed bowel gas pattern. No osseous abnormality identified.  Visualized abdominal and pelvic visceral contours are within normal limits.  Volume of retained stool in the colon appears stable and within normal limits.  IMPRESSION: Nonobstructed bowel gas pattern.  Provider: JShaune Spittle No results found for this or any previous visit.  No results found for this or any previous visit.  No results found for this or any previous visit.  No results found for this or any previous visit.  No results found for this or any previous visit.  No results found for this or any previous visit.  No results found for this or any previous visit.   Assessment & Plan:    1. Bladder outlet obstruction -We will trial flomax 0.426mdaily - Urinalysis, Routine w reflex  microscopic - BLADDER SCAN AMB NON-IMAGING   No follow-ups on file.  PaNicolette BangMD  [facility] [place]

## 2020-11-23 ENCOUNTER — Telehealth: Payer: Self-pay

## 2020-11-23 NOTE — Telephone Encounter (Signed)
Care taker called- [facility] not [facility] flomax or generic. Is there an alternative you can prescribe for the patient?

## 2020-11-24 ENCOUNTER — Other Ambulatory Visit: Payer: Self-pay

## 2020-11-24 DIAGNOSIS — N320 Bladder-neck obstruction: Secondary | ICD-10-CM

## 2020-11-24 MED ORDER — ALFUZOSIN HCL ER 10 MG PO TB24: 10.0000 mg | ORAL_TABLET | Freq: Every day | ORAL | 1 refills | Status: DC

## 2020-11-24 NOTE — Telephone Encounter (Signed)
Uroxatral 106m qhs

## 2020-11-24 NOTE — Telephone Encounter (Signed)
Rx sent in

## 2020-11-29 ENCOUNTER — Ambulatory Visit: Payer: Medicaid Other | Admitting: Physician Assistant

## 2020-11-30 ENCOUNTER — Encounter (HOSPITAL_COMMUNITY): Payer: Self-pay | Admitting: Emergency Medicine

## 2020-11-30 ENCOUNTER — Emergency Department (HOSPITAL_COMMUNITY)
Admission: EM | Admit: 2020-11-30 | Discharge: 2020-11-30 | Disposition: A | Discharge status: Home / Self Care | Payer: Self-pay | Attending: Emergency Medicine | Admitting: Emergency Medicine

## 2020-11-30 ENCOUNTER — Other Ambulatory Visit: Payer: Self-pay

## 2020-11-30 ENCOUNTER — Emergency Department (HOSPITAL_COMMUNITY): Payer: Self-pay

## 2020-11-30 DIAGNOSIS — Z87891 Personal history of nicotine dependence: Secondary | ICD-10-CM | POA: Insufficient documentation

## 2020-11-30 DIAGNOSIS — E1065 Type 1 diabetes mellitus with hyperglycemia: Secondary | ICD-10-CM | POA: Insufficient documentation

## 2020-11-30 DIAGNOSIS — Z79899 Other long term (current) drug therapy: Secondary | ICD-10-CM | POA: Insufficient documentation

## 2020-11-30 DIAGNOSIS — R739 Hyperglycemia, unspecified: Secondary | ICD-10-CM

## 2020-11-30 DIAGNOSIS — R609 Edema, unspecified: Secondary | ICD-10-CM | POA: Insufficient documentation

## 2020-11-30 DIAGNOSIS — Z91040 Latex allergy status: Secondary | ICD-10-CM | POA: Insufficient documentation

## 2020-11-30 DIAGNOSIS — I10 Essential (primary) hypertension: Secondary | ICD-10-CM | POA: Insufficient documentation

## 2020-11-30 DIAGNOSIS — E1040 Type 1 diabetes mellitus with diabetic neuropathy, unspecified: Secondary | ICD-10-CM | POA: Insufficient documentation

## 2020-11-30 DIAGNOSIS — J4530 Mild persistent asthma, uncomplicated: Secondary | ICD-10-CM | POA: Insufficient documentation

## 2020-11-30 LAB — CBC WITH DIFFERENTIAL/PLATELET
Abs Immature Granulocytes: 0.05 10*3/uL (ref 0.00–0.07)
Basophils Absolute: 0.1 10*3/uL (ref 0.0–0.1)
Basophils Relative: 1 %
Eosinophils Absolute: 0.5 10*3/uL (ref 0.0–0.5)
Eosinophils Relative: 6 %
HCT: 32.6 % — ABNORMAL LOW (ref 36.0–46.0)
Hemoglobin: 11.1 g/dL — ABNORMAL LOW (ref 12.0–15.0)
Immature Granulocytes: 1 %
Lymphocytes Relative: 19 %
Lymphs Abs: 1.7 10*3/uL (ref 0.7–4.0)
MCH: 30 pg (ref 26.0–34.0)
MCHC: 34 g/dL (ref 30.0–36.0)
MCV: 88.1 fL (ref 80.0–100.0)
Monocytes Absolute: 0.9 10*3/uL (ref 0.1–1.0)
Monocytes Relative: 10 %
Neutro Abs: 5.7 10*3/uL (ref 1.7–7.7)
Neutrophils Relative %: 63 %
Platelets: 317 10*3/uL (ref 150–400)
RBC: 3.7 MIL/uL — ABNORMAL LOW (ref 3.87–5.11)
RDW: 12.2 % (ref 11.5–15.5)
WBC: 8.9 10*3/uL (ref 4.0–10.5)
nRBC: 0 % (ref 0.0–0.2)

## 2020-11-30 LAB — BRAIN NATRIURETIC PEPTIDE: B Natriuretic Peptide: 50 pg/mL (ref 0.0–100.0)

## 2020-11-30 LAB — COMPREHENSIVE METABOLIC PANEL
ALT: 34 U/L (ref 0–44)
AST: 41 U/L (ref 15–41)
Albumin: 2.2 g/dL — ABNORMAL LOW (ref 3.5–5.0)
Alkaline Phosphatase: 105 U/L (ref 38–126)
Anion gap: 9 (ref 5–15)
BUN: 18 mg/dL (ref 6–20)
CO2: 25 mmol/L (ref 22–32)
Calcium: 8.6 mg/dL — ABNORMAL LOW (ref 8.9–10.3)
Chloride: 98 mmol/L (ref 98–111)
Creatinine, Ser: 0.83 mg/dL (ref 0.44–1.00)
GFR, Estimated: >60 mL/min (ref >60)
Glucose, Bld: 297 mg/dL — ABNORMAL HIGH (ref 70–99)
Potassium: 3.8 mmol/L (ref 3.5–5.1)
Sodium: 132 mmol/L — ABNORMAL LOW (ref 135–145)
Total Bilirubin: 1.1 mg/dL (ref 0.3–1.2)
Total Protein: 6.4 g/dL — ABNORMAL LOW (ref 6.5–8.1)

## 2020-11-30 LAB — CBG MONITORING, ED: Glucose-Capillary: 76 mg/dL (ref 70–99)

## 2020-11-30 LAB — TROPONIN I (HIGH SENSITIVITY): Troponin I (High Sensitivity): 12 ng/L (ref <18)

## 2020-11-30 NOTE — ED Provider Notes (Signed)
[facility] EMERGENCY DEPARTMENT Provider Note   CSN: [id number] Arrival date & time: 11/30/20  0309     History Chief Complaint  Patient presents with  . Fluid Retention    Diamond [name] is a 24 y.o. female.  HPI      Diamond [name] is a 24 y.o. female with past medical history of type 1 diabetes, hypertension, and pancreatitis who presents to the Emergency Department complaining of generalized swelling and feeling as though she is fluid overloaded.  She was admitted at [facility] on 11/25/2020 for DKA and pancreatitis.  She was discharged yesterday.  States that she had swelling of her abdomen and lower extremities at time of discharge and the swelling is not improving despite taking her Lasix.  She went to [facility] for same, but left due to wait time.  She denies fever, chills, chest pain and shortness of breath.   Past Medical History:  Diagnosis Date  . ADHD (attention deficit hyperactivity disorder) 2007  . Allergy   . Asthma   . Bipolar disorder ([place])   . Crohn's colitis ([place])    Dx Dr. Cherrie Gauze  . Depression    early childhood, trial of seroquel?  . Diabetic neuropathy ([place])   . GERD (gastroesophageal reflux disease)   . Hypertension   . Pancreatitis   . Panic attacks   . PTSD (post-traumatic stress disorder)   . Stroke ([place]) 07/2020   R side  . Type 1 diabetes ([place]) 2008    Patient Active Problem List   Diagnosis Date Noted  . Essential hypertension, benign 11/01/2020  . Edema 10/19/2020  . Anasarca 10/18/2020  . Hypoalbuminemia 10/18/2020  . Personal history of noncompliance with medical treatment, presenting hazards to health 10/12/2020  . Bilateral lower extremity edema 08/25/2020  . Pneumonia 08/25/2020  . Hyperglycemia 08/25/2020  . Eczema 10/23/2012  . Abdominal pain 08/27/2012  . GERD (gastroesophageal reflux disease) 07/25/2012  . Mood disorder ([place]) 07/09/2012  . Contraception management 07/09/2012  . History of Crohn's  disease 07/09/2012  . Uncontrolled type 1 diabetes mellitus with hyperglycemia ([place]) 07/08/2012  . Asthma, persistent controlled 07/08/2012    Past Surgical History:  Procedure Laterality Date  . ADENOIDECTOMY    . HERNIA REPAIR    . TONSILLECTOMY       OB History   No obstetric history on file.     Family History  Problem Relation Age of Onset  . Hypertension Mother   . Diabetes Mother   . Heart disease Mother   . Crohn's disease Father   . Alcohol abuse Father   . Drug abuse Father   . Mental illness Father   . Cancer Father   . Hypertension Maternal Aunt   . Seizures Maternal Aunt   . Depression Maternal Aunt   . Asthma Maternal Uncle   . Hypertension Maternal Uncle   . Cancer Maternal Grandmother   . Diabetes Maternal Grandmother   . Mental illness Maternal Grandfather     Social History   Tobacco Use  . Smoking status: Former Smoker    Types: Cigarettes    Quit date: 12/03/2008    Years since quitting: 12.0  . Smokeless tobacco: Never Used  Vaping Use  . Vaping Use: Never used  Substance Use Topics  . Alcohol use: No  . Drug use: No    Home Medications Prior to Admission medications   Medication Sig Start Date End Date Taking? Authorizing Provider  albuterol (VENTOLIN HFA) 108 (  90 Base) MCG/ACT inhaler Inhale 2 puffs into the lungs every 6 (six) hours as needed. For shortness of breath 10/21/20  Yes Emokpae, Courage, MD  citalopram (CELEXA) 10 MG tablet Take 10 mg by mouth daily.   Yes [provider]  furosemide (LASIX) 40 MG tablet Take 1 tablet (40 mg total) by mouth daily. 10/21/20  Yes Emokpae, Courage, MD  gabapentin (NEURONTIN) 100 MG capsule Take 1 capsule (100 mg total) by mouth daily. 10/13/20  Yes [name], PA-C  Insulin Glargine (BASAGLAR KWIKPEN) 100 UNIT/ML Inject 44 Units into the skin at bedtime. 11/01/20  Yes Nida, Marella Chimes, MD  insulin lispro (HUMALOG KWIKPEN) 100 UNIT/ML KwikPen Inject 10-16 Units into the skin  3 (three) times daily before meals. 11/01/20  Yes Nida, Marella Chimes, MD  lisinopril (ZESTRIL) 20 MG tablet Take 1 tablet (20 mg total) by mouth daily. 11/01/20  Yes Cassandria Anger, MD  ondansetron (ZOFRAN ODT) 8 MG disintegrating tablet Take 1 tablet (8 mg total) by mouth every 8 (eight) hours as needed for nausea. 11/05/20  Yes Nanavati, Ankit, MD  polyethylene glycol (MIRALAX / GLYCOLAX) 17 g packet Take 17 g by mouth daily as needed. Patient taking differently: Take 17 g by mouth daily as needed for mild constipation. 10/21/20  Yes Emokpae, Courage, MD  potassium chloride (KLOR-CON) 10 MEQ tablet Take 1 tablet (10 mEq total) by mouth daily. Take While taking Lasix/furosemide 10/21/20  Yes Emokpae, Courage, MD  traZODone (DESYREL) 50 MG tablet Take 50 mg by mouth at bedtime.   Yes [provider]  alfuzosin (UROXATRAL) 10 MG 24 hr tablet Take 1 tablet (10 mg total) by mouth daily with breakfast. 11/24/20   McKenzie, Candee Furbish, MD  Multiple Vitamin (MULTIVITAMIN WITH MINERALS) TABS tablet Take 1 tablet by mouth daily. Patient not taking: Reported on 11/30/2020 10/22/20   Roxan Hockey, MD  promethazine (PHENERGAN) 25 MG suppository Place 1 suppository (25 mg total) rectally every 6 (six) hours as needed for nausea. Patient not taking: No sig reported 11/05/20   Varney Biles, MD  tamsulosin (FLOMAX) 0.4 MG CAPS capsule Take 1 capsule (0.4 mg total) by mouth daily. 11/15/20   McKenzie, Candee Furbish, MD    Allergies    Amoxicillin, Dexamethasone, Doxycycline, Penicillins, Augmentin [amoxicillin-pot clavulanate], Ceftriaxone sodium in dextrose, Clavulanic acid, Adhesive [tape], Latex, Other, and Pineapple  Review of Systems   Review of Systems  Constitutional: Negative for chills and fever.  Respiratory: Negative for cough and shortness of breath.   Cardiovascular: Negative for chest pain.  Gastrointestinal: Positive for abdominal distention and nausea.  Genitourinary:  Negative for difficulty urinating and dysuria.  Musculoskeletal: Negative for arthralgias and joint swelling.  Skin: Negative for color change and wound.  Neurological: Negative for dizziness, syncope and headaches.  Psychiatric/Behavioral: Negative for confusion.    Physical Exam Updated Vital Signs BP (!) 134/91 (BP Location: Right Arm)   Pulse (!) 101   Temp 98.9 F (37.2 C) (Oral)   Resp 16   Ht 4' 11"  (1.499 m)   Wt 55.8 kg   LMP 03/22/2020 (Approximate) Comment: pt shielded  SpO2 99%   BMI 24.84 kg/m   Physical Exam Vitals and nursing note reviewed.  Constitutional:      General: She is not in acute distress.    Appearance: Normal appearance.  HENT:     Mouth/Throat:     Mouth: Mucous membranes are moist.  Eyes:     General: No scleral icterus.  Conjunctiva/sclera: Conjunctivae normal.  Cardiovascular:     Rate and Rhythm: Normal rate and regular rhythm.     Pulses: Normal pulses.  Pulmonary:     Effort: Pulmonary effort is normal. No respiratory distress.  Chest:     Chest wall: No tenderness.  Abdominal:     General: There is distension.     Tenderness: There is no abdominal tenderness. There is no right CVA tenderness or left CVA tenderness.  Musculoskeletal:     Cervical back: No tenderness.     Right lower leg: Edema present.     Left lower leg: Edema present.  Skin:    General: Skin is warm.     Findings: No rash.  Neurological:     General: No focal deficit present.     Mental Status: She is alert.     Sensory: No sensory deficit.     Motor: No weakness.     ED Results / Procedures / Treatments   Labs (all labs ordered are listed, but only abnormal results are displayed) Labs Reviewed  COMPREHENSIVE METABOLIC PANEL - Abnormal; Notable for the following components:      Result Value   Sodium 132 (*)    Glucose, Bld 297 (*)    Calcium 8.6 (*)    Total Protein 6.4 (*)    Albumin 2.2 (*)    All other components within normal limits  CBC  WITH DIFFERENTIAL/PLATELET - Abnormal; Notable for the following components:   RBC 3.70 (*)    Hemoglobin 11.1 (*)    HCT 32.6 (*)    All other components within normal limits  BRAIN NATRIURETIC PEPTIDE  TROPONIN I (HIGH SENSITIVITY)  TROPONIN I (HIGH SENSITIVITY)    EKG EKG Interpretation  Date/Time:  Wednesday November 30 2020 10:35:20 EST Ventricular Rate:  118 PR Interval:  120 QRS Duration: 82 QT Interval:  312 QTC Calculation: 437 R Axis:   95 Text Interpretation: Sinus tachycardia Rightward axis T wave abnormality, consider inferior ischemia Abnormal ECG No significant change since last tracing Confirmed by Calvert Cantor [phone]) on 11/30/2020 10:46:53 AM   Radiology DG Chest Portable 1 View  Result Date: 11/30/2020 CLINICAL DATA:  Chest pain, fluid retention with lower extremity edema EXAM: PORTABLE CHEST 1 VIEW COMPARISON:  10/18/2020 FINDINGS: Mild left basilar opacity, atelectasis versus pneumonia. No frank interstitial edema. Surgical fusion Heart is normal in size. IMPRESSION: Mild left basilar opacity, atelectasis versus pneumonia. No frank interstitial edema. Electronically Signed   By: Julian Hy M.D.   On: 11/30/2020 09:40    Procedures Procedures (including critical care time)  Medications Ordered in ED Medications - No data to display  ED Course  I have reviewed the triage vital signs and the nursing notes.  Pertinent labs & imaging results that were available during my care of the patient were reviewed by me and considered in my medical decision making (see chart for details).    MDM Rules/Calculators/A&P                          Pt here for reported fluid retention after recent d/c from another facility.  Well appearing, no respiratory distress on exam.  Vitals reassuring.    CXR w/o evidence of pleural effusion.  Labs w/o leukocytosis, blood glucose elevated, but no evidence of DKA and anion gap wnml.  BNP also wnml.  Albumin and protein  low.  Possibly from poor nutritional intake and or malabsorption.  Doubt  liver disease as transaminases are reassuring. No acute EKG changes and trop reassuring.  Edema felt to be related to hypoalbuminemia  Pt also seen by Dr. Karle Starch and care plan discussed.  Doubt emergent process.  Pt agrees to close f/u with her endocrinologist.     Final Clinical Impression(s) / ED Diagnoses Final diagnoses:  Edema, unspecified type  Hyperglycemia    Rx / DC Orders ED Discharge Orders    None       Kem Parkinson, PA-C 12/02/20 0039    Truddie Hidden, MD 12/02/20 [phone]

## 2020-11-30 NOTE — Discharge Instructions (Addendum)
Call Dr. Liliane Channel office to arrange a follow-up appointment for this week.  It is important that you monitor your sugars closely and take your insulin as directed.

## 2020-11-30 NOTE — ED Triage Notes (Signed)
Pt c/o fluid retention since 12/24. States she was admitted to [facility] 12/14-12/28

## 2020-12-05 ENCOUNTER — Encounter: Payer: Self-pay | Admitting: Physician Assistant

## 2020-12-05 ENCOUNTER — Ambulatory Visit: Payer: Medicaid Other | Admitting: Physician Assistant

## 2020-12-05 ENCOUNTER — Other Ambulatory Visit: Payer: Self-pay

## 2020-12-05 VITALS — BP 136/100 | HR 105 | Temp 98.0°F | Ht 59.0 in | Wt 123.2 lb

## 2020-12-05 DIAGNOSIS — R110 Nausea: Secondary | ICD-10-CM

## 2020-12-05 DIAGNOSIS — R3989 Other symptoms and signs involving the genitourinary system: Secondary | ICD-10-CM

## 2020-12-05 DIAGNOSIS — R1084 Generalized abdominal pain: Secondary | ICD-10-CM

## 2020-12-05 DIAGNOSIS — R197 Diarrhea, unspecified: Secondary | ICD-10-CM

## 2020-12-05 DIAGNOSIS — E1065 Type 1 diabetes mellitus with hyperglycemia: Secondary | ICD-10-CM

## 2020-12-05 LAB — POCT URINALYSIS DIPSTICK
Bilirubin, UA: NEGATIVE
Glucose, UA: POSITIVE — AB
Leukocytes, UA: NEGATIVE
Nitrite, UA: NEGATIVE
Protein, UA: POSITIVE — AB
Spec Grav, UA: 1.01 (ref 1.010–1.025)
Urobilinogen, UA: 0.2 E.U./dL
pH, UA: 7 (ref 5.0–8.0)

## 2020-12-05 MED ORDER — PROMETHAZINE HCL 25 MG PO TABS: 25.0000 mg | ORAL_TABLET | Freq: Three times a day (TID) | ORAL | 0 refills | Status: DC | PRN

## 2020-12-05 MED ORDER — PROMETHAZINE HCL 25 MG/ML IJ SOLN
25.0000 mg | Freq: Once | INTRAMUSCULAR | Status: AC
  Administered 2020-12-05: 25 mg via INTRAMUSCULAR

## 2020-12-05 NOTE — Progress Notes (Signed)
BP (!) 136/100   Pulse (!) 105   Temp 98 F (36.7 C)   Ht 4' 11"  (1.499 m)   Wt 123 lb 3.2 oz (55.9 kg)   SpO2 97%   BMI 24.88 kg/m    Subjective:    Patient ID: Diamond [name], female    DOB: Feb 06, 1996, 25 y.o.   MRN: [id number]  HPI: Diamond [name] is a 25 y.o. female presenting on 12/05/2020 for Edema   HPI  Pt had a negative covid 19 screening questionnaire.   Pt is 24yoF with multiple medical issues.  Pt has been hopping around between ERs several  times since she was here last.  She is complaining of abd pain .   She is having diarrhea which "is like water with mucus".  Her emesis "tastes like poop".   Per Epic note, blood sugar 938 at [facility] upon presentation    Urologist- bladder problems- has appointment 12/27/20 Endocrinolosit- DM - has appt tomorrow.  GI - abd pain - has appt in 2 days on  january 5   [facility] referred her to cardiologist for edema.   Pt had normal echo 10/19/20   She says she has [employer] for [employer]  She is Waiting to hear on approval.    She says not eating much but is drinking okay.      Relevant past medical, surgical, family and social history reviewed and updated as indicated. Interim medical history since our last visit reviewed. Allergies and medications reviewed and updated.   Current Outpatient Medications:  .  citalopram (CELEXA) 10 MG tablet, Take 10 mg by mouth daily., Disp: , Rfl:  .  furosemide (LASIX) 20 MG tablet, Take 80 mg by mouth., Disp: , Rfl:  .  hydrochlorothiazide (HYDRODIURIL) 25 MG tablet, Take 25 mg by mouth daily., Disp: , Rfl:  .  Insulin Glargine (BASAGLAR KWIKPEN) 100 UNIT/ML, Inject 44 Units into the skin at bedtime. (Patient taking differently: Inject 40 Units into the skin at bedtime.), Disp: 15 mL, Rfl: 2 .  insulin lispro (HUMALOG KWIKPEN) 100 UNIT/ML KwikPen, Inject 10-16 Units into the skin 3 (three) times daily before meals., Disp: 15 mL, Rfl: 2 .  lisinopril (ZESTRIL) 20 MG tablet, Take 1 tablet  (20 mg total) by mouth daily., Disp: 90 tablet, Rfl: 1 .  potassium chloride (KLOR-CON) 10 MEQ tablet, Take 1 tablet (10 mEq total) by mouth daily. Take While taking Lasix/furosemide, Disp: 30 tablet, Rfl: 2 .  traZODone (DESYREL) 50 MG tablet, Take 50 mg by mouth at bedtime., Disp: , Rfl:  .  albuterol (VENTOLIN HFA) 108 (90 Base) MCG/ACT inhaler, Inhale 2 puffs into the lungs every 6 (six) hours as needed. For shortness of breath (Patient not taking: Reported on 12/05/2020), Disp: 18 g, Rfl: 1 .  alfuzosin (UROXATRAL) 10 MG 24 hr tablet, Take 1 tablet (10 mg total) by mouth daily with breakfast. (Patient not taking: Reported on 12/05/2020), Disp: 30 tablet, Rfl: 1 .  furosemide (LASIX) 40 MG tablet, Take 1 tablet (40 mg total) by mouth daily. (Patient not taking: Reported on 12/05/2020), Disp: 90 tablet, Rfl: 3 .  gabapentin (NEURONTIN) 100 MG capsule, Take 1 capsule (100 mg total) by mouth daily. (Patient not taking: Reported on 12/05/2020), Disp: 30 capsule, Rfl: 0 .  Multiple Vitamin (MULTIVITAMIN WITH MINERALS) TABS tablet, Take 1 tablet by mouth daily. (Patient not taking: No sig reported), Disp: 30 tablet, Rfl: 5 .  ondansetron (ZOFRAN ODT) 8 MG disintegrating tablet, Take  1 tablet (8 mg total) by mouth every 8 (eight) hours as needed for nausea. (Patient not taking: Reported on 12/05/2020), Disp: 20 tablet, Rfl: 0 .  polyethylene glycol (MIRALAX / GLYCOLAX) 17 g packet, Take 17 g by mouth daily as needed. (Patient not taking: Reported on 12/05/2020), Disp: 14 each, Rfl: 0 .  promethazine (PHENERGAN) 25 MG suppository, Place 1 suppository (25 mg total) rectally every 6 (six) hours as needed for nausea. (Patient not taking: No sig reported), Disp: 12 each, Rfl: 0 .  tamsulosin (FLOMAX) 0.4 MG CAPS capsule, Take 1 capsule (0.4 mg total) by mouth daily. (Patient not taking: Reported on 12/05/2020), Disp: 30 capsule, Rfl: 11    Review of Systems  Per HPI unless specifically indicated above     Objective:     BP (!) 136/100   Pulse (!) 105   Temp 98 F (36.7 C)   Ht 4' 11"  (1.499 m)   Wt 123 lb 3.2 oz (55.9 kg)   SpO2 97%   BMI 24.88 kg/m   Wt Readings from Last 3 Encounters:  12/05/20 123 lb 3.2 oz (55.9 kg)  11/30/20 123 lb (55.8 kg)  11/05/20 121 lb (54.9 kg)    Physical Exam Constitutional:      General: She is not in acute distress.    Appearance: She is not toxic-appearing.  HENT:     Head: Normocephalic and atraumatic.  Cardiovascular:     Rate and Rhythm: Normal rate and regular rhythm.  Pulmonary:     Effort: Pulmonary effort is normal. No respiratory distress.  Abdominal:     General: Bowel sounds are normal.     Palpations: Abdomen is soft. There is no mass.     Tenderness: There is no guarding or rebound.  Musculoskeletal:     Cervical back: Neck supple.     Right lower leg: No edema.     Left lower leg: No edema.  Neurological:     Mental Status: She is alert and oriented to person, place, and time.  Psychiatric:        Behavior: Behavior normal.     Comments: Pt Is cooperative today and responds appropriately     Results for orders placed or performed in visit on 12/05/20  POCT Urinalysis Dipstick  Result Value Ref Range   Color, UA LIGHT YELLOW    Clarity, UA CLEAR    Glucose, UA Positive (A) Negative   Bilirubin, UA NEG    Ketones, UA TRACE    Spec Grav, UA 1.010 1.010 - 1.025   Blood, UA TRACE-LYSED    pH, UA 7.0 5.0 - 8.0   Protein, UA Positive (A) Negative   Urobilinogen, UA 0.2 0.2 or 1.0 E.U./dL   Nitrite, UA NEG    Leukocytes, UA Negative Negative   Appearance     Odor        Assessment & Plan:     Encounter Diagnoses  Name Primary?  . Urinary problem Yes  . Nausea   . Uncontrolled type 1 diabetes mellitus with hyperglycemia ([place])   . Generalized abdominal pain   . Diarrhea, unspecified type      -pt is given phenergan IM in office and refill sent to pharmacy -she is given samples of glucerna to help with her protein  intake -discussed importance of her seeing her specialists as scheduled -no changes today.  Await evaluation by GI and endocrine this week -pt to follow up 1 month.  She is to contact  office sooner prn worsening or new symtpoms

## 2020-12-06 ENCOUNTER — Ambulatory Visit: Payer: Medicaid Other | Admitting: "Endocrinology

## 2020-12-07 ENCOUNTER — Encounter: Payer: Self-pay | Admitting: "Endocrinology

## 2020-12-07 ENCOUNTER — Telehealth: Payer: Self-pay | Admitting: Internal Medicine

## 2020-12-07 ENCOUNTER — Ambulatory Visit (INDEPENDENT_AMBULATORY_CARE_PROVIDER_SITE_OTHER): Payer: Self-pay | Admitting: "Endocrinology

## 2020-12-07 ENCOUNTER — Ambulatory Visit (INDEPENDENT_AMBULATORY_CARE_PROVIDER_SITE_OTHER): Payer: Self-pay | Admitting: Internal Medicine

## 2020-12-07 ENCOUNTER — Telehealth: Payer: Self-pay

## 2020-12-07 ENCOUNTER — Encounter: Payer: Self-pay | Admitting: Internal Medicine

## 2020-12-07 ENCOUNTER — Other Ambulatory Visit: Payer: Self-pay

## 2020-12-07 VITALS — BP 156/90 | HR 108 | Ht 59.0 in | Wt 125.0 lb

## 2020-12-07 VITALS — BP 141/94 | HR 109 | Temp 96.9°F | Ht 59.0 in | Wt 124.2 lb

## 2020-12-07 DIAGNOSIS — E1065 Type 1 diabetes mellitus with hyperglycemia: Secondary | ICD-10-CM

## 2020-12-07 DIAGNOSIS — K219 Gastro-esophageal reflux disease without esophagitis: Secondary | ICD-10-CM

## 2020-12-07 DIAGNOSIS — I10 Essential (primary) hypertension: Secondary | ICD-10-CM

## 2020-12-07 DIAGNOSIS — K50919 Crohn's disease, unspecified, with unspecified complications: Secondary | ICD-10-CM

## 2020-12-07 DIAGNOSIS — K3184 Gastroparesis: Secondary | ICD-10-CM

## 2020-12-07 LAB — POCT GLYCOSYLATED HEMOGLOBIN (HGB A1C): HbA1c, POC (controlled diabetic range): 14.7 % — AB (ref 0.0–7.0)

## 2020-12-07 NOTE — Telephone Encounter (Signed)
Caregiver who was with pt at [facility] this morning asked I call Diamond [name] (Education officer, [employer]) with pre-op/covid test appt. [phone].  LMOVM for [name] and informed him of pre-op/covid test appt. Appt letter and procedure instructions mailed to pt.

## 2020-12-07 NOTE — Patient Instructions (Signed)
We will set you up for EGD and colonoscopy to evaluate the severity of your Crohn's disease.  Pending endoscopic findings, we will likely start the process of obtaining chronic medication for you.  Continue on Reglan for gastroparesis.  The most important thing you can do is keep your diabetes under good control.  Follow-up after procedures.  At [facility] we value your feedback. You may receive a survey about your visit today. Please share your experience as we strive to create trusting relationships with our patients to provide genuine, compassionate, quality care.  We appreciate your understanding and patience as we review any laboratory studies, imaging, and other diagnostic tests that are ordered as we care for you. Our office policy is 5 business days for review of these results, and any emergent or urgent results are addressed in a timely manner for your best interest. If you do not hear from our office in 1 week, please contact us.   We also encourage the use of MyChart, which contains your medical information for your review as well. If you are not enrolled in this feature, an access code is on this after visit summary for your convenience. Thank you for allowing [place] to be involved in your care.  It was great to see you today!  I hope you have a great rest of your winter!!    Diamond [name]. Abbey Chatters, D.O. Gastroenterology and Hepatology [facility]

## 2020-12-07 NOTE — Patient Instructions (Signed)
                                     Advice for Weight Management  -For most of us the best way to lose weight is by diet management. Generally speaking, diet management means consuming less calories intentionally which over time brings about progressive weight loss.  This can be achieved more effectively by restricting carbohydrate consumption to the minimum possible.  So, it is critically important to know your numbers: how much calorie you are consuming and how much calorie you need. More importantly, our carbohydrates sources should be unprocessed or minimally processed complex starch food items.   Sometimes, it is important to balance nutrition by increasing protein intake (animal or plant source), fruits, and vegetables.  -Sticking to a routine mealtime to eat 3 meals a day and avoiding unnecessary snacks is shown to have a big role in weight control. Under normal circumstances, the only time we lose real weight is when we are hungry, so allow hunger to take place- hunger means no food between meal times, only water.  It is not advisable to starve.   -It is better to avoid simple carbohydrates including: Cakes, Sweet Desserts, Ice Cream, Soda (diet and regular), Sweet Tea, Candies, Chips, Cookies, Store Bought Juices, Alcohol in Excess of  1-2 drinks a day, Lemonade,  Artificial Sweeteners, Doughnuts, Coffee Creamers, "Sugar-free" Products, etc, etc.  This is not a complete list.....    -Consulting with certified diabetes educators is proven to provide you with the most accurate and current information on diet.  Also, you may be  interested in discussing diet options/exchanges , we can schedule a visit with Diamond [name], RDN, CDE for individualized nutrition education.  -Exercise: If you are able: 30 -60 minutes a day ,4 days a week, or 150 minutes a week.  The longer the better.  Combine stretch, strength, and aerobic activities.  If you were told in the  past that you have high risk for cardiovascular diseases, you may seek evaluation by your heart doctor prior to initiating moderate to intense exercise programs.                                  Additional Care Considerations for Diabetes   -Diabetes  is a chronic disease.  The most important care consideration is regular follow-up with your diabetes care provider with the goal being avoiding or delaying its complications and to take advantage of advances in medications and technology.    -Type 2 diabetes is known to coexist with other important comorbidities such as high blood pressure and high cholesterol.  It is critical to control not only the diabetes but also the high blood pressure and high cholesterol to minimize and delay the risk of complications including coronary artery disease, stroke, amputations, blindness, etc.    - Studies showed that people with diabetes will benefit from a class of medications known as ACE inhibitors and statins.  Unless there are specific reasons not to be on these medications, the standard of care is to consider getting one from these groups of medications at an optimal doses.  These medications are generally considered safe and proven to help protect the heart and the kidneys.    - People with diabetes are encouraged to initiate and maintain regular follow-up with eye doctors, foot   doctors, dentists , and if necessary heart and kidney doctors.     - It is highly recommended that people with diabetes quit smoking or stay away from smoking, and get yearly  flu vaccine and pneumonia vaccine at least every 5 years.  One other important lifestyle recommendation is to ensure adequate sleep - at least 6-7 hours of uninterrupted sleep at night.  -Exercise: If you are able: 30 -60 minutes a day, 4 days a week, or 150 minutes a week.  The longer the better.  Combine stretch, strength, and aerobic activities.  If you were told in the past that you have high risk for  cardiovascular diseases, you may seek evaluation by your heart doctor prior to initiating moderate to intense exercise programs.          

## 2020-12-07 NOTE — Telephone Encounter (Signed)
Faxed letter to Disability for the pt. Letter done by Dr. Abbey Chatters

## 2020-12-07 NOTE — Telephone Encounter (Signed)
PATIENT STATES DR Abbey Chatters IS SUPPOSED TO WRITE A LETTER FOR DISABILITY AND THEY NEED IT FAXED TO [phone]

## 2020-12-07 NOTE — Progress Notes (Signed)
ch

## 2020-12-07 NOTE — Progress Notes (Signed)
12/07/2020, 1:39 PM  Endocrinology follow-up note   Subjective:    Patient ID: Diamond [name], female    DOB: Mar 30, 1996.  Diamond [name] is being seen in follow-up after she was seen in consultation for management of currently uncontrolled symptomatic type I diabetes. She is accompanied by her aide who is assisting her with her monitoring and insulin administration activities.    Past Medical History:  Diagnosis Date  . ADHD (attention deficit hyperactivity disorder) 2007  . Allergy   . Asthma   . Bipolar disorder ([place])   . Crohn's colitis ([place])    Dx Dr. Cherrie Gauze  . Depression    early childhood, trial of seroquel?  . Diabetic neuropathy ([place])   . GERD (gastroesophageal reflux disease)   . Hypertension   . Pancreatitis   . Panic attacks   . PTSD (post-traumatic stress disorder)   . Stroke ([place]) 07/2020   R side  . Type 1 diabetes ([place]) 2008    Past Surgical History:  Procedure Laterality Date  . ADENOIDECTOMY    . HERNIA REPAIR    . TONSILLECTOMY      Social History   Socioeconomic History  . Marital status: Single    Spouse name: Not on file  . Number of children: Not on file  . Years of education: Not on file  . Highest education level: Not on file  Occupational History  . Not on file  Tobacco Use  . Smoking status: Former Smoker    Types: Cigarettes    Quit date: 12/03/2008    Years since quitting: 12.0  . Smokeless tobacco: Never Used  Vaping Use  . Vaping Use: Never used  Substance and Sexual Activity  . Alcohol use: No  . Drug use: No  . Sexual activity: Yes  Other Topics Concern  . Not on file  Social History Narrative  . Not on file   Social Determinants of Health   Financial Resource Strain: Not on file  Food Insecurity: Not on file  Transportation Needs: Not on file  Physical Activity: Not on file  Stress: Not on file  Social  Connections: Not on file    Family History  Problem Relation Age of Onset  . Hypertension Mother   . Diabetes Mother   . Heart disease Mother   . Crohn's disease Father   . Alcohol abuse Father   . Drug abuse Father   . Mental illness Father   . Cancer Father   . Hypertension Maternal Aunt   . Seizures Maternal Aunt   . Depression Maternal Aunt   . Asthma Maternal Uncle   . Hypertension Maternal Uncle   . Cancer Maternal Grandmother   . Diabetes Maternal Grandmother   . Mental illness Maternal Grandfather     Outpatient Encounter Medications as of 12/07/2020  Medication Sig  . albuterol (VENTOLIN HFA) 108 (90 Base) MCG/ACT inhaler Inhale 2 puffs into the lungs every 6 (six) hours as needed. For shortness of breath (Patient taking differently: Inhale 2 puffs into the lungs as needed. For shortness of breath)  .  alfuzosin (UROXATRAL) 10 MG 24 hr tablet Take 1 tablet (10 mg total) by mouth daily with breakfast. (Patient not taking: No sig reported)  . citalopram (CELEXA) 10 MG tablet Take 10 mg by mouth daily.  . furosemide (LASIX) 20 MG tablet Take 80 mg by mouth.  . furosemide (LASIX) 40 MG tablet Take 1 tablet (40 mg total) by mouth daily. (Patient not taking: No sig reported)  . gabapentin (NEURONTIN) 100 MG capsule Take 1 capsule (100 mg total) by mouth daily. (Patient not taking: No sig reported)  . hydrochlorothiazide (HYDRODIURIL) 25 MG tablet Take 25 mg by mouth daily. (Patient not taking: Reported on 12/07/2020)  . Insulin Glargine (BASAGLAR KWIKPEN) 100 UNIT/ML Inject 44 Units into the skin at bedtime. (Patient taking differently: Inject 40 Units into the skin at bedtime.)  . insulin lispro (HUMALOG KWIKPEN) 100 UNIT/ML KwikPen Inject 10-16 Units into the skin 3 (three) times daily before meals.  Marland Kitchen lisinopril (ZESTRIL) 20 MG tablet Take 1 tablet (20 mg total) by mouth daily.  . metoCLOPramide (REGLAN) 10 MG tablet Take 10 mg by mouth daily. Take 1 tablet every 6 hours for 7  days.  . Multiple Vitamin (MULTIVITAMIN WITH MINERALS) TABS tablet Take 1 tablet by mouth daily. (Patient not taking: No sig reported)  . ondansetron (ZOFRAN ODT) 8 MG disintegrating tablet Take 1 tablet (8 mg total) by mouth every 8 (eight) hours as needed for nausea. (Patient not taking: No sig reported)  . polyethylene glycol (MIRALAX / GLYCOLAX) 17 g packet Take 17 g by mouth daily as needed. (Patient not taking: No sig reported)  . potassium chloride (KLOR-CON) 10 MEQ tablet Take 1 tablet (10 mEq total) by mouth daily. Take While taking Lasix/furosemide  . promethazine (PHENERGAN) 25 MG suppository Place 1 suppository (25 mg total) rectally every 6 (six) hours as needed for nausea. (Patient not taking: No sig reported)  . promethazine (PHENERGAN) 25 MG tablet Take 1 tablet (25 mg total) by mouth every 8 (eight) hours as needed for nausea or vomiting. (Patient taking differently: Take 25 mg by mouth daily.)  . tamsulosin (FLOMAX) 0.4 MG CAPS capsule Take 1 capsule (0.4 mg total) by mouth daily. (Patient not taking: No sig reported)  . traZODone (DESYREL) 50 MG tablet Take 50 mg by mouth at bedtime.   No facility-administered encounter medications on file as of 12/07/2020.    ALLERGIES: Allergies  Allergen Reactions  . Amoxicillin Hives    Did it involve swelling of the face/tongue/throat, SOB, or low BP? no  Did it involve sudden or severe rash/hives, skin peeling, or any reaction on the inside of your mouth or nose? no Did you need to seek medical attention at a hospital or doctor's office? no When did it last happen?unk If all above answers are "NO", may proceed with cephalosporin use.  Marland Kitchen Dexamethasone Hives and Itching  . Doxycycline Itching and Swelling  . Penicillins Hives and Itching    Did it involve swelling of the face/tongue/throat, SOB, or low BP? no  Did it involve sudden or severe rash/hives, skin peeling, or any reaction on the inside of your mouth or nose? no Did you  need to seek medical attention at a hospital or doctor's office? no When did it last happen?unk If all above answers are "NO", may proceed with cephalosporin use. Did it involve swelling of the face/tongue/throat, SOB, or low BP? no  Did it involve sudden or severe rash/hives, skin peeling, or any reaction on the inside of  your mouth or nose? no Did you need to seek medical attention at a hospital or doctor's office? no When did it last happen?unk If all above answers are "NO", may proceed with cephalosporin use. Tolerated keflex May 2021 with no issue.   . Augmentin [Amoxicillin-Pot Clavulanate] Other (See Comments)    Reaction unknown  . Ceftriaxone Sodium In Dextrose   . Clavulanic Acid   . Adhesive [Tape] Itching and Rash  . Latex Itching and Rash  . Other Itching and Rash  . Pineapple Rash    Rash on tongue and throat    VACCINATION STATUS: Immunization History  Administered Date(s) Administered  . HPV Quadrivalent 08/27/2012  . Hepatitis A 08/27/2012  . Influenza Split 08/27/2012  . Meningococcal Conjugate 08/27/2012  . Moderna Sars-Covid-2 Vaccination 03/02/2020, 03/29/2020    Diabetes She presents for her follow-up diabetic visit. She has type 1 diabetes mellitus. Onset time: She was diagnosed at approximate age of 33 years. Her disease course has been improving. Pertinent negatives for hypoglycemia include no confusion, headaches, pallor or seizures. Associated symptoms include blurred vision, fatigue, foot paresthesias, foot ulcerations, polydipsia and polyuria. Pertinent negatives for diabetes include no chest pain and no polyphagia. There are no hypoglycemic complications. Symptoms are improving. Diabetic complications include peripheral neuropathy. (Lack of adequate health insurance.  Noncompliance/nonengagement.) Risk factors for coronary artery disease include diabetes mellitus, family history, sedentary lifestyle and tobacco exposure. Current diabetic  treatment includes insulin injections. She is compliant with treatment some of the time. Her weight is increasing steadily. She is following a generally unhealthy diet. When asked about meal planning, she reported none. She has not had a previous visit with a dietitian. She never participates in exercise. Her home blood glucose trend is decreasing steadily. Her breakfast blood glucose range is generally 140-180 mg/dl. Her lunch blood glucose range is generally 140-180 mg/dl. Her dinner blood glucose range is generally 180-200 mg/dl. Her bedtime blood glucose range is generally 180-200 mg/dl. Her overall blood glucose range is 180-200 mg/dl. (She presents with significantly improved glycemic profile no significant fluctuating glycemic profile. No hypoglycemia.  She did not bring her meter nor did she document insulin administration.   Her point-of-care A1c is 14.7%, still high however improving from >15.5%.  ) An ACE inhibitor/angiotensin II receptor blocker is being taken.     Review of Systems  Constitutional: Positive for fatigue. Negative for chills, fever and unexpected weight change.  HENT: Negative for trouble swallowing and voice change.   Eyes: Positive for blurred vision. Negative for visual disturbance.  Respiratory: Negative for cough, shortness of breath and wheezing.   Cardiovascular: Negative for chest pain, palpitations and leg swelling.  Gastrointestinal: Negative for diarrhea, nausea and vomiting.  Endocrine: Positive for polydipsia and polyuria. Negative for cold intolerance, heat intolerance and polyphagia.  Musculoskeletal: Negative for arthralgias and myalgias.  Skin: Negative for color change, pallor, rash and wound.  Neurological: Negative for seizures and headaches.  Psychiatric/Behavioral: Negative for confusion and suicidal ideas.    Objective:    Vitals with BMI 12/07/2020 12/07/2020 12/05/2020  Height 4' 11"  4' 11"  4' 11"   Weight 125 lbs 124 lbs 3 oz 123 lbs 3 oz  BMI  25.23 61.44 31.54  Systolic 008 676 195  Diastolic 90 94 093  Pulse 108 109 105    BP (!) 156/90   Pulse (!) 108   Ht 4' 11"  (1.499 m)   Wt 125 lb (56.7 kg)   BMI 25.25 kg/m   Wt Readings  from Last 3 Encounters:  12/07/20 125 lb (56.7 kg)  12/07/20 124 lb 3.2 oz (56.3 kg)  12/05/20 123 lb 3.2 oz (55.9 kg)     Physical Exam Constitutional:      Appearance: She is well-developed.  HENT:     Head: Normocephalic and atraumatic.  Neck:     Thyroid: No thyromegaly.     Trachea: No tracheal deviation.  Cardiovascular:     Comments: Distant heart sounds. Pulmonary:     Effort: Pulmonary effort is normal.  Abdominal:     Tenderness: There is no abdominal tenderness. There is no guarding.  Musculoskeletal:        General: No swelling. Normal range of motion.     Cervical back: Normal range of motion and neck supple.     Right lower leg: No edema.     Left lower leg: No edema.  Skin:    General: Skin is warm and dry.     Coloration: Skin is not pale.     Findings: No erythema or rash.  Neurological:     Mental Status: She is alert and oriented to person, place, and time.     Cranial Nerves: No cranial nerve deficit.     Coordination: Coordination normal.     Deep Tendon Reflexes: Reflexes are normal and symmetric.  Psychiatric:     Comments: Patient is very reluctant.  She displays unconcerned affect.     CMP ( most recent) CMP     Component Value Date/Time   NA 132 (L) 11/30/2020 0936   K 3.8 11/30/2020 0936   CL 98 11/30/2020 0936   CO2 25 11/30/2020 0936   GLUCOSE 297 (H) 11/30/2020 0936   BUN 18 11/30/2020 0936   CREATININE 0.83 11/30/2020 0936   CREATININE 0.69 07/08/2012 1203   CALCIUM 8.6 (L) 11/30/2020 0936   PROT 6.4 (L) 11/30/2020 0936   ALBUMIN 2.2 (L) 11/30/2020 0936   AST 41 11/30/2020 0936   ALT 34 11/30/2020 0936   ALKPHOS 105 11/30/2020 0936   BILITOT 1.1 11/30/2020 0936   GFRNONAA >60 11/30/2020 0936   GFRAA >60 08/26/2020 0507     Diabetic Labs (most recent): Lab Results  Component Value Date   HGBA1C 14.7 (A) 12/07/2020   HGBA1C >15.5 (H) 08/25/2020   HGBA1C 14.0 02/03/2020    Lab Results  Component Value Date   TSH 1.078 08/25/2020   TSH 1.181 07/08/2012      Assessment & Plan:   1. Uncontrolled type 1 diabetes mellitus with hyperglycemia (HCC) - NEELIE WELSHANS has currently uncontrolled symptomatic type 1 DM since  25 years of age. Her work-up in the hospital was negative for CHF, or ascites.  She has hypoalbuminemia.  She presents with significantly improved glycemic profile no significant fluctuating glycemic profile. No hypoglycemia.  She did not bring her meter nor did she document insulin administration.   Her point-of-care A1c is 14.7%, still high however improving from >15.5%.  - Recent labs reviewed. - I had a long discussion with her about the progressive nature of diabetes and the pathology behind its complications. -her diabetes is complicated by chronic nonadherence/noncompliance, peripheral neuropathy and she remains at exceedingly  high risk for more acute and chronic complications which include CAD, CVA, CKD, retinopathy, and neuropathy. These are all discussed in detail with her.  - I have counseled her on diet management  by adopting a carbohydrate restricted/protein rich diet. Patient is encouraged to switch to  unprocessed or minimally  processed     complex starch and increased protein intake (animal or plant source), fruits, and vegetables.  -  she is advised to stick to a routine mealtimes to eat 3 meals  a day and avoid unnecessary snacks ( to snack only to correct hypoglycemia).   - she acknowledges that there is a room for improvement in her food and drink choices. - Suggestion is made for her to avoid simple carbohydrates  from her diet including Cakes, Sweet Desserts, Ice Cream, Soda (diet and regular), Sweet Tea, Candies, Chips, Cookies, Store Bought Juices, Alcohol in  Excess of  1-2 drinks a day, Artificial Sweeteners,  Coffee Creamer, and "Sugar-free" Products, Lemonade. This will help patient to have more stable blood glucose profile and potentially avoid unintended weight gain.  - she has been scheduled with Jearld Fenton, RDN, CDE for diabetes education.  She did not keep her follow-up.  - I have reapproached her with the following individualized plan to manage  her diabetes and patient agrees:   - she has an aide from integrative medicine who promises to offer her help.   -She needs nutritional rehabilitation with increased protein intake.   -In light of her presentation with near target glycemic profile, she is advised to continue Basaglar 40 units nightly, continue Humalog 10 units 3 times a day with meals  for pre-meal BG readings of 90-156m/dl, plus patient specific correction dose for unexpected hyperglycemia above 1583mdl, associated with strict monitoring of glucose 4 times a day-before meals and at bedtime. - she is warned not to take insulin without proper monitoring per orders. - Adjustment parameters are given to her for hypo and hyperglycemia in writing. - she is encouraged to call clinic for blood glucose levels less than 70 or above 300 mg /dl.  - she is not a candidate for metformin, SGLT2 inhibitors, nor incretin therapy.   - Specific targets for  A1c;  LDL, HDL,  and Triglycerides were discussed with the patient.  2) Blood Pressure /Hypertension: Her blood pressure is improved to 156/90, still above target.   She is advised to continue lisinopril 20 mg.  Daily, she is off of HCTZ.  She has Lasix as needed.   She is advised to continue monitoring blood pressure at home and report if they are greater than 160/90.     3) Lipids/Hyperlipidemia: She does not have any lipid panel to review.     She will be considered for fasting lipid panel on subsequent visits.   4)  Weight/Diet:  Body mass index is 25.25 kg/m.  -She is not a candidate  for weight loss.  Exercise, and detailed carbohydrates information provided  -  detailed on discharge instructions.  5) Chronic Care/Health Maintenance:  -she  is not  on ACEI/ARB and Statin medications and  is encouraged to initiate and continue to follow up with Ophthalmology, Dentist,  Podiatrist at least yearly or according to recommendations, and advised to   stay away from smoking. I have recommended yearly flu vaccine and pneumonia vaccine at least every 5 years; moderate intensity exercise for up to 150 minutes weekly; and  sleep for at least 7 hours a day.  -She is advised to increase her Lasix to 40 mg p.o. every morning.  This patient is in desperate need for social worker to assist with her social life.  She has significant social constraints including lack of health  insurance which are interfering with her diabetes care.  - she is  advised  to maintain close follow up with her PCP for primary care needs, as well as her other providers for optimal and coordinated care.  - Time spent on this patient care encounter:  35 min, of which > 50% was spent in  counseling and the rest reviewing her blood glucose logs , discussing her hypoglycemia and hyperglycemia episodes, reviewing her current and  previous labs / studies  ( including abstraction from other facilities) and medications  doses and developing a  long term treatment plan and documenting her care.   Please refer to Patient Instructions for Blood Glucose Monitoring and Insulin/Medications Dosing Guide"  in media tab for additional information. Please  also refer to " Patient Self Inventory" in the Media  tab for reviewed elements of pertinent patient history.  [name] participated in the discussions, expressed understanding, and voiced agreement with the above plans.  All questions were answered to her satisfaction. she is encouraged to contact clinic should she have any questions or concerns prior to her return  visit.   Follow up plan: - Return in about 3 months (around 03/07/2021) for F/U with Meter and Logs Only - no Labs.  Glade Lloyd, MD [facility] [address] [place], [place] [address] Phone: [phone]  Fax: [phone]    12/07/2020, 1:39 PM  This note was partially dictated with voice recognition software. Similar sounding words can be transcribed inadequately or may not  be corrected upon review.

## 2020-12-07 NOTE — Telephone Encounter (Signed)
Noted  Routed to Dr. Abbey Chatters to address the letter

## 2020-12-08 DIAGNOSIS — E1143 Type 2 diabetes mellitus with diabetic autonomic (poly)neuropathy: Secondary | ICD-10-CM | POA: Insufficient documentation

## 2020-12-08 DIAGNOSIS — K3184 Gastroparesis: Secondary | ICD-10-CM | POA: Insufficient documentation

## 2020-12-08 DIAGNOSIS — K5090 Crohn's disease, unspecified, without complications: Secondary | ICD-10-CM | POA: Insufficient documentation

## 2020-12-08 NOTE — Progress Notes (Signed)
Primary Care Physician:  [name], PA-C Primary Gastroenterologist:  Dr. Abbey Chatters  Chief Complaint  Patient presents with  . Crohn's Disease    HPI:   Diamond [name] is a 25 y.o. female who presents to the clinic today by referral from her PCP [name] for evaluation.  She has a complicated past medical history.  She has been a type I diabetic for over 15 years and has numerous comorbidities related to this including gastroparesis on Reglan, peripheral neuropathy, previous stroke, pancreatitis.    She also notes a history of Crohn's disease.  She is unsure when she was diagnosed with this.  States she was "young."  Unsure when her last colonoscopy was.  Not currently take any chronic medications at this juncture for her Crohn's disease.  She states she was on pills at one point which were large green pills. She had a CT scan without contrast 11/26/2020 which was limited due to lack of contrast but did note mild thickening of the terminal ileum as well as moderate stool burden.  Patient also reports a history of chronic GERD.  Her main complaint for me today is ongoing abdominal pain.  States her pain is primarily periumbilical, does not radiate.  Also notes abdominal distention.  Past Medical History:  Diagnosis Date  . ADHD (attention deficit hyperactivity disorder) 2007  . Allergy   . Asthma   . Bipolar disorder ([place])   . Crohn's colitis ([place])    Dx Dr. Cherrie Gauze  . Depression    early childhood, trial of seroquel?  . Diabetic neuropathy ([place])   . GERD (gastroesophageal reflux disease)   . Hypertension   . Pancreatitis   . Panic attacks   . PTSD (post-traumatic stress disorder)   . Stroke ([place]) 07/2020   R side  . Type 1 diabetes ([place]) 2008    Past Surgical History:  Procedure Laterality Date  . ADENOIDECTOMY    . HERNIA REPAIR    . TONSILLECTOMY      Current Outpatient Medications  Medication Sig Dispense Refill  . albuterol (VENTOLIN  HFA) 108 (90 Base) MCG/ACT inhaler Inhale 2 puffs into the lungs every 6 (six) hours as needed. For shortness of breath (Patient taking differently: Inhale 2 puffs into the lungs as needed. For shortness of breath) 18 g 1  . citalopram (CELEXA) 10 MG tablet Take 10 mg by mouth daily.    . furosemide (LASIX) 20 MG tablet Take 80 mg by mouth.    . Insulin Glargine (BASAGLAR KWIKPEN) 100 UNIT/ML Inject 44 Units into the skin at bedtime. (Patient taking differently: Inject 40 Units into the skin at bedtime.) 15 mL 2  . insulin lispro (HUMALOG KWIKPEN) 100 UNIT/ML KwikPen Inject 10-16 Units into the skin 3 (three) times daily before meals. 15 mL 2  . lisinopril (ZESTRIL) 20 MG tablet Take 1 tablet (20 mg total) by mouth daily. 90 tablet 1  . metoCLOPramide (REGLAN) 10 MG tablet Take 10 mg by mouth daily. Take 1 tablet every 6 hours for 7 days.    . potassium chloride (KLOR-CON) 10 MEQ tablet Take 1 tablet (10 mEq total) by mouth daily. Take While taking Lasix/furosemide 30 tablet 2  . promethazine (PHENERGAN) 25 MG tablet Take 1 tablet (25 mg total) by mouth every 8 (eight) hours as needed for nausea or vomiting. (Patient taking differently: Take 25 mg by mouth daily.) 20 tablet 0  . traZODone (DESYREL) 50 MG tablet Take 50 mg by mouth  at bedtime.    Marland Kitchen alfuzosin (UROXATRAL) 10 MG 24 hr tablet Take 1 tablet (10 mg total) by mouth daily with breakfast. (Patient not taking: No sig reported) 30 tablet 1  . furosemide (LASIX) 40 MG tablet Take 1 tablet (40 mg total) by mouth daily. (Patient not taking: No sig reported) 90 tablet 3  . gabapentin (NEURONTIN) 100 MG capsule Take 1 capsule (100 mg total) by mouth daily. (Patient not taking: No sig reported) 30 capsule 0  . Multiple Vitamin (MULTIVITAMIN WITH MINERALS) TABS tablet Take 1 tablet by mouth daily. (Patient not taking: No sig reported) 30 tablet 5  . ondansetron (ZOFRAN ODT) 8 MG disintegrating tablet Take 1 tablet (8 mg total) by mouth every 8 (eight)  hours as needed for nausea. (Patient not taking: No sig reported) 20 tablet 0  . polyethylene glycol (MIRALAX / GLYCOLAX) 17 g packet Take 17 g by mouth daily as needed. (Patient not taking: No sig reported) 14 each 0  . promethazine (PHENERGAN) 25 MG suppository Place 1 suppository (25 mg total) rectally every 6 (six) hours as needed for nausea. (Patient not taking: No sig reported) 12 each 0  . tamsulosin (FLOMAX) 0.4 MG CAPS capsule Take 1 capsule (0.4 mg total) by mouth daily. (Patient not taking: No sig reported) 30 capsule 11   No current facility-administered medications for this visit.    Allergies as of 12/07/2020 - Review Complete 12/07/2020  Allergen Reaction Noted  . Amoxicillin Hives 04/28/2012  . Dexamethasone Hives and Itching 05/04/2020  . Doxycycline Itching and Swelling 04/17/2020  . Penicillins Hives and Itching 04/28/2012  . Augmentin [amoxicillin-pot clavulanate] Other (See Comments) 07/12/2012  . Ceftriaxone sodium in dextrose  02/29/2020  . Clavulanic acid  05/29/2019  . Adhesive [tape] Itching and Rash 07/12/2012  . Latex Itching and Rash 05/15/2012  . Other Itching and Rash 07/12/2012  . Pineapple Rash 06/26/2012    Family History  Problem Relation Age of Onset  . Hypertension Mother   . Diabetes Mother   . Heart disease Mother   . Crohn's disease Father   . Alcohol abuse Father   . Drug abuse Father   . Mental illness Father   . Cancer Father   . Hypertension Maternal Aunt   . Seizures Maternal Aunt   . Depression Maternal Aunt   . Asthma Maternal Uncle   . Hypertension Maternal Uncle   . Cancer Maternal Grandmother   . Diabetes Maternal Grandmother   . Mental illness Maternal Grandfather     Social History   Socioeconomic History  . Marital status: Single    Spouse name: Not on file  . Number of children: Not on file  . Years of education: Not on file  . Highest education level: Not on file  Occupational History  . Not on file  Tobacco  Use  . Smoking status: Former Smoker    Types: Cigarettes    Quit date: 12/03/2008    Years since quitting: 12.0  . Smokeless tobacco: Never Used  Vaping Use  . Vaping Use: Never used  Substance and Sexual Activity  . Alcohol use: No  . Drug use: No  . Sexual activity: Yes  Other Topics Concern  . Not on file  Social History Narrative  . Not on file   Social Determinants of Health   Financial Resource Strain: Not on file  Food Insecurity: Not on file  Transportation Needs: Not on file  Physical Activity: Not on file  Stress:  Not on file  Social Connections: Not on file  Intimate Partner Violence: Not on file    Subjective: Review of Systems  Constitutional: Negative for chills and fever.  HENT: Negative for congestion and hearing loss.   Eyes: Negative for blurred vision and double vision.  Respiratory: Negative for cough and shortness of breath.   Cardiovascular: Negative for chest pain and palpitations.  Gastrointestinal: Positive for abdominal pain and diarrhea. Negative for blood in stool, constipation, heartburn, melena and vomiting.       Mucus in stool  Genitourinary: Negative for dysuria and urgency.  Musculoskeletal: Negative for joint pain and myalgias.  Skin: Negative for itching and rash.  Neurological: Negative for dizziness and headaches.  Psychiatric/Behavioral: Negative for depression. The patient is not nervous/anxious.        Objective: BP (!) 141/94   Pulse (!) 109   Temp (!) 96.9 F (36.1 C) (Temporal)   Ht 4' 11"  (1.499 m)   Wt 124 lb 3.2 oz (56.3 kg)   LMP 03/03/2020   BMI 25.09 kg/m  Physical Exam Constitutional:      Appearance: Normal appearance.  HENT:     Head: Normocephalic and atraumatic.  Eyes:     Extraocular Movements: Extraocular movements intact.     Conjunctiva/sclera: Conjunctivae normal.  Cardiovascular:     Rate and Rhythm: Normal rate and regular rhythm.  Pulmonary:     Effort: Pulmonary effort is normal.      Breath sounds: Normal breath sounds.  Abdominal:     General: Bowel sounds are normal. There is distension.     Palpations: Abdomen is soft.     Tenderness: There is abdominal tenderness.  Musculoskeletal:        General: No swelling. Normal range of motion.     Cervical back: Normal range of motion and neck supple.  Skin:    General: Skin is warm and dry.     Coloration: Skin is not jaundiced.  Neurological:     General: No focal deficit present.     Mental Status: She is alert and oriented to person, place, and time.  Psychiatric:        Mood and Affect: Mood normal.        Behavior: Behavior normal.      Assessment: *Crohn's disease *Gastroparesis *Chronic GERD  Plan: Discussed Crohn's disease in depth with patient today.  We will first need to get an idea of disease activity prior to contemplating treatment.   -Recommended that she undergo EGD and colonoscopy and she is agreeable.  -We will also evaluate her chronic GERD at the same time.   -Continue on Reglan for gastroparesis.  Counseled on the vast importance of tight diabetic control. -Further recommendations to follow.  12/08/2020 1:30 PM   Disclaimer: This note was dictated with voice recognition software. Similar sounding words can inadvertently be transcribed and may not be corrected upon review.

## 2020-12-08 NOTE — Telephone Encounter (Signed)
Letter printed off and faxed to number given

## 2020-12-08 NOTE — Telephone Encounter (Signed)
noted 

## 2020-12-12 ENCOUNTER — Telehealth: Payer: Self-pay

## 2020-12-13 ENCOUNTER — Other Ambulatory Visit: Payer: Self-pay

## 2020-12-13 ENCOUNTER — Telehealth: Payer: Self-pay

## 2020-12-13 ENCOUNTER — Telehealth: Payer: Self-pay | Admitting: Internal Medicine

## 2020-12-13 DIAGNOSIS — K219 Gastro-esophageal reflux disease without esophagitis: Secondary | ICD-10-CM

## 2020-12-13 DIAGNOSIS — R112 Nausea with vomiting, unspecified: Secondary | ICD-10-CM

## 2020-12-13 MED ORDER — ONDANSETRON HCL 4 MG PO TABS: 4.0000 mg | ORAL_TABLET | Freq: Four times a day (QID) | ORAL | 1 refills | Status: DC

## 2020-12-13 MED ORDER — TAMSULOSIN HCL 0.4 MG PO CAPS
0.4000 mg | ORAL_CAPSULE | Freq: Every day | ORAL | 0 refills | Status: DC
Start: 2020-12-13 — End: 2021-02-15

## 2020-12-13 NOTE — Telephone Encounter (Signed)
1.Sent in Rx for Zofran 4 mg, QID,prn, 30 tabs with 1 refill per Dr. Abbey Chatters  2. Phone the pt back and advised her that Rx has been sent to [facility] in [place], [place]. The pt was advised of the directions to take this medication. Also once medication is in her system to get in her fluids (water, Gatorades, Pedialyte). Pt agreed.   3. Contacted (texted) Dr. Abbey Chatters back advising I had taken care of this.

## 2020-12-13 NOTE — Telephone Encounter (Signed)
Pt is asking for phenergan or something for nausea to be called into [facility]. [phone] or [phone]

## 2020-12-13 NOTE — Telephone Encounter (Signed)
I returned the pt's call and since yesterday she has had nausea, vomitting (this morning), and diarrhea. States she can't keep anything down or in. No other symptoms but these mentioned. States she is nauseated at least one/twice a week. Please call in Rx to [facility] in [place], [place]

## 2020-12-13 NOTE — Telephone Encounter (Signed)
Spoke with pts caregiver. I explained I sent a task to Dr. Alyson Ingles asking if he wanted to try another med on formulary for [facility] to save pt money or just send in to [facility]. Caregiver said to go ahead and send into [facility] this time but still call [facility] and see what they have because pt will not be able to pay for it again. Also told her I would in the morning and call her back after talking with dr.

## 2020-12-13 NOTE — Telephone Encounter (Signed)
What alpha blocker will they cover?

## 2020-12-14 NOTE — Telephone Encounter (Signed)
Zofran sent to pharm

## 2020-12-27 ENCOUNTER — Ambulatory Visit: Payer: Medicaid Other | Admitting: Urology

## 2020-12-29 ENCOUNTER — Telehealth: Payer: Self-pay

## 2020-12-29 NOTE — Telephone Encounter (Signed)
Spoke with pt. Asked for caregiver but she said it was her nurse and she is out having a baby. Explained to her that we were not able to get another type of med for Alfuzosin from [facility]. I called but they do not have any of those types of meds.

## 2021-01-05 ENCOUNTER — Encounter: Payer: Self-pay | Admitting: *Deleted

## 2021-01-05 ENCOUNTER — Telehealth: Payer: Self-pay

## 2021-01-05 ENCOUNTER — Ambulatory Visit: Payer: Medicaid Other | Admitting: Physician Assistant

## 2021-01-05 NOTE — Telephone Encounter (Signed)
TCS/EGD w/Propofol w/Dr. Abbey Chatters ASA 3 for 01/10/21 has to be rescheduled per endo.  Called pt, procedure moved to next available 01/31/21--pm only available. Endo scheduler.

## 2021-01-09 ENCOUNTER — Encounter (HOSPITAL_COMMUNITY): Payer: MEDICAID

## 2021-01-09 ENCOUNTER — Other Ambulatory Visit (HOSPITAL_COMMUNITY): Payer: MEDICAID

## 2021-01-11 ENCOUNTER — Encounter: Payer: Self-pay | Admitting: Physician Assistant

## 2021-01-17 ENCOUNTER — Telehealth: Payer: Self-pay | Admitting: Physician Assistant

## 2021-01-17 NOTE — Telephone Encounter (Signed)
Pt called stating that she has not been feeling well for a week.  She says her legs hurt and she "can't lift her legs".  She says her "whole body is swollen".    Pt says she feels bad.  She is sounding half asleep while speaking with her on the telephone.   Discussed with pt that in light of her complex history and brittle diabetes and covid pandemic, she needs to go to the ER for evaluation.    Pt was a no-show to her appointment on 01/05/21.  Attempt was made to get pt to reschedule her appointment but she refused and said she didn't want to come back in until April.

## 2021-01-20 ENCOUNTER — Telehealth: Payer: Self-pay | Admitting: *Deleted

## 2021-01-20 NOTE — Telephone Encounter (Signed)
Patient aware appointment letter she received for pre-op/covid test is incorrect. He pre-op/covid scheduled for 2/28 at 9:00am.

## 2021-01-20 NOTE — Telephone Encounter (Signed)
Pt called in stating she is in a lot of pain and having a lot of swelling. She is swollen all over. She is scheduled for TCS/EGD 3/1 with Dr. Abbey Chatters. Patient wants to know what she needs to do before then because she can barely walk.

## 2021-01-23 ENCOUNTER — Encounter (HOSPITAL_COMMUNITY): Payer: Self-pay | Admitting: *Deleted

## 2021-01-23 ENCOUNTER — Inpatient Hospital Stay (HOSPITAL_COMMUNITY)
Admission: EM | Admit: 2021-01-23 | Discharge: 2021-01-27 | DRG: 638 | Disposition: A | Discharge status: Home / Self Care | Payer: Self-pay | Attending: Internal Medicine | Admitting: Internal Medicine

## 2021-01-23 ENCOUNTER — Other Ambulatory Visit: Payer: Self-pay

## 2021-01-23 ENCOUNTER — Emergency Department (HOSPITAL_COMMUNITY): Payer: Self-pay

## 2021-01-23 DIAGNOSIS — F4310 Post-traumatic stress disorder, unspecified: Secondary | ICD-10-CM | POA: Diagnosis present

## 2021-01-23 DIAGNOSIS — Z9114 Patient's other noncompliance with medication regimen: Secondary | ICD-10-CM

## 2021-01-23 DIAGNOSIS — Z818 Family history of other mental and behavioral disorders: Secondary | ICD-10-CM

## 2021-01-23 DIAGNOSIS — R601 Generalized edema: Secondary | ICD-10-CM | POA: Diagnosis present

## 2021-01-23 DIAGNOSIS — Z23 Encounter for immunization: Secondary | ICD-10-CM

## 2021-01-23 DIAGNOSIS — Z794 Long term (current) use of insulin: Secondary | ICD-10-CM

## 2021-01-23 DIAGNOSIS — I10 Essential (primary) hypertension: Secondary | ICD-10-CM | POA: Diagnosis present

## 2021-01-23 DIAGNOSIS — Z833 Family history of diabetes mellitus: Secondary | ICD-10-CM

## 2021-01-23 DIAGNOSIS — R739 Hyperglycemia, unspecified: Secondary | ICD-10-CM

## 2021-01-23 DIAGNOSIS — E8770 Fluid overload, unspecified: Secondary | ICD-10-CM | POA: Diagnosis present

## 2021-01-23 DIAGNOSIS — Z20822 Contact with and (suspected) exposure to covid-19: Secondary | ICD-10-CM | POA: Diagnosis present

## 2021-01-23 DIAGNOSIS — Z8719 Personal history of other diseases of the digestive system: Secondary | ICD-10-CM

## 2021-01-23 DIAGNOSIS — Z888 Allergy status to other drugs, medicaments and biological substances status: Secondary | ICD-10-CM

## 2021-01-23 DIAGNOSIS — E8809 Other disorders of plasma-protein metabolism, not elsewhere classified: Secondary | ICD-10-CM | POA: Diagnosis present

## 2021-01-23 DIAGNOSIS — F419 Anxiety disorder, unspecified: Secondary | ICD-10-CM | POA: Diagnosis present

## 2021-01-23 DIAGNOSIS — F319 Bipolar disorder, unspecified: Secondary | ICD-10-CM | POA: Diagnosis present

## 2021-01-23 DIAGNOSIS — Z881 Allergy status to other antibiotic agents status: Secondary | ICD-10-CM

## 2021-01-23 DIAGNOSIS — Z91018 Allergy to other foods: Secondary | ICD-10-CM

## 2021-01-23 DIAGNOSIS — E1065 Type 1 diabetes mellitus with hyperglycemia: Principal | ICD-10-CM | POA: Diagnosis present

## 2021-01-23 DIAGNOSIS — K5090 Crohn's disease, unspecified, without complications: Secondary | ICD-10-CM | POA: Diagnosis present

## 2021-01-23 DIAGNOSIS — Z880 Allergy status to penicillin: Secondary | ICD-10-CM

## 2021-01-23 DIAGNOSIS — Z8249 Family history of ischemic heart disease and other diseases of the circulatory system: Secondary | ICD-10-CM

## 2021-01-23 DIAGNOSIS — I959 Hypotension, unspecified: Secondary | ICD-10-CM | POA: Diagnosis not present

## 2021-01-23 DIAGNOSIS — Z91048 Other nonmedicinal substance allergy status: Secondary | ICD-10-CM

## 2021-01-23 DIAGNOSIS — R1900 Intra-abdominal and pelvic swelling, mass and lump, unspecified site: Secondary | ICD-10-CM | POA: Diagnosis present

## 2021-01-23 DIAGNOSIS — K219 Gastro-esophageal reflux disease without esophagitis: Secondary | ICD-10-CM | POA: Diagnosis present

## 2021-01-23 DIAGNOSIS — Z91040 Latex allergy status: Secondary | ICD-10-CM

## 2021-01-23 DIAGNOSIS — Z87891 Personal history of nicotine dependence: Secondary | ICD-10-CM

## 2021-01-23 DIAGNOSIS — R0789 Other chest pain: Secondary | ICD-10-CM | POA: Diagnosis not present

## 2021-01-23 DIAGNOSIS — R600 Localized edema: Secondary | ICD-10-CM | POA: Diagnosis present

## 2021-01-23 DIAGNOSIS — Z79899 Other long term (current) drug therapy: Secondary | ICD-10-CM

## 2021-01-23 DIAGNOSIS — R609 Edema, unspecified: Secondary | ICD-10-CM

## 2021-01-23 HISTORY — DX: Edema, unspecified: R60.9

## 2021-01-23 LAB — CBC WITH DIFFERENTIAL/PLATELET
Abs Immature Granulocytes: 0.01 10*3/uL (ref 0.00–0.07)
Basophils Absolute: 0.1 10*3/uL (ref 0.0–0.1)
Basophils Relative: 1 %
Eosinophils Absolute: 0.6 10*3/uL — ABNORMAL HIGH (ref 0.0–0.5)
Eosinophils Relative: 9 %
HCT: 37 % (ref 36.0–46.0)
Hemoglobin: 12.3 g/dL (ref 12.0–15.0)
Immature Granulocytes: 0 %
Lymphocytes Relative: 24 %
Lymphs Abs: 1.7 10*3/uL (ref 0.7–4.0)
MCH: 29.4 pg (ref 26.0–34.0)
MCHC: 33.2 g/dL (ref 30.0–36.0)
MCV: 88.3 fL (ref 80.0–100.0)
Monocytes Absolute: 0.3 10*3/uL (ref 0.1–1.0)
Monocytes Relative: 5 %
Neutro Abs: 4.3 10*3/uL (ref 1.7–7.7)
Neutrophils Relative %: 61 %
Platelets: 383 10*3/uL (ref 150–400)
RBC: 4.19 MIL/uL (ref 3.87–5.11)
RDW: 12.8 % (ref 11.5–15.5)
WBC: 6.9 10*3/uL (ref 4.0–10.5)
nRBC: 0 % (ref 0.0–0.2)

## 2021-01-23 LAB — COMPREHENSIVE METABOLIC PANEL
ALT: 20 U/L (ref 0–44)
AST: 16 U/L (ref 15–41)
Albumin: 2.4 g/dL — ABNORMAL LOW (ref 3.5–5.0)
Alkaline Phosphatase: 106 U/L (ref 38–126)
Anion gap: 7 (ref 5–15)
BUN: 10 mg/dL (ref 6–20)
CO2: 27 mmol/L (ref 22–32)
Calcium: 8.6 mg/dL — ABNORMAL LOW (ref 8.9–10.3)
Chloride: 91 mmol/L — ABNORMAL LOW (ref 98–111)
Creatinine, Ser: 0.77 mg/dL (ref 0.44–1.00)
GFR, Estimated: >60 mL/min (ref >60)
Glucose, Bld: 781 mg/dL (ref 70–99)
Potassium: 4.4 mmol/L (ref 3.5–5.1)
Sodium: 125 mmol/L — ABNORMAL LOW (ref 135–145)
Total Bilirubin: 0.8 mg/dL (ref 0.3–1.2)
Total Protein: 7.1 g/dL (ref 6.5–8.1)

## 2021-01-23 LAB — BRAIN NATRIURETIC PEPTIDE: B Natriuretic Peptide: 44 pg/mL (ref 0.0–100.0)

## 2021-01-23 LAB — URINALYSIS, ROUTINE W REFLEX MICROSCOPIC
Bilirubin Urine: NEGATIVE
Glucose, UA: >=500 mg/dL — AB
Ketones, ur: NEGATIVE mg/dL
Nitrite: NEGATIVE
Protein, ur: 100 mg/dL — AB
Specific Gravity, Urine: 1.02 (ref 1.005–1.030)
pH: 7 (ref 5.0–8.0)

## 2021-01-23 LAB — BASIC METABOLIC PANEL
Anion gap: 7 (ref 5–15)
BUN: 10 mg/dL (ref 6–20)
CO2: 26 mmol/L (ref 22–32)
Calcium: 8.6 mg/dL — ABNORMAL LOW (ref 8.9–10.3)
Chloride: 94 mmol/L — ABNORMAL LOW (ref 98–111)
Creatinine, Ser: 0.84 mg/dL (ref 0.44–1.00)
GFR, Estimated: >60 mL/min (ref >60)
Glucose, Bld: 733 mg/dL (ref 70–99)
Potassium: 4.8 mmol/L (ref 3.5–5.1)
Sodium: 127 mmol/L — ABNORMAL LOW (ref 135–145)

## 2021-01-23 LAB — GLUCOSE, CAPILLARY
Glucose-Capillary: 300 mg/dL — ABNORMAL HIGH (ref 70–99)
Glucose-Capillary: 310 mg/dL — ABNORMAL HIGH (ref 70–99)

## 2021-01-23 LAB — CBG MONITORING, ED
Glucose-Capillary: >600 mg/dL (ref 70–99)
Glucose-Capillary: 580 mg/dL (ref 70–99)
Glucose-Capillary: >600 mg/dL (ref 70–99)
Glucose-Capillary: 558 mg/dL (ref 70–99)
Glucose-Capillary: 442 mg/dL — ABNORMAL HIGH (ref 70–99)
Glucose-Capillary: 443 mg/dL — ABNORMAL HIGH (ref 70–99)
Glucose-Capillary: 506 mg/dL (ref 70–99)
Glucose-Capillary: 374 mg/dL — ABNORMAL HIGH (ref 70–99)

## 2021-01-23 LAB — RESP PANEL BY RT-PCR (FLU A&B, COVID) ARPGX2
Influenza A by PCR: NEGATIVE
Influenza B by PCR: NEGATIVE
SARS Coronavirus 2 by RT PCR: NEGATIVE

## 2021-01-23 LAB — BLOOD GAS, VENOUS
Acid-Base Excess: 5.7 mmol/L — ABNORMAL HIGH (ref 0.0–2.0)
Bicarbonate: 25.9 mmol/L (ref 20.0–28.0)
FIO2: 21
O2 Saturation: 21.2 %
Patient temperature: 37
pCO2, Ven: 63.9 mmHg — ABNORMAL HIGH (ref 44.0–60.0)
pH, Ven: 7.315 (ref 7.250–7.430)
pO2, Ven: <31 mmHg — CL (ref 32.0–45.0)

## 2021-01-23 LAB — PREGNANCY, URINE: Preg Test, Ur: NEGATIVE

## 2021-01-23 LAB — LIPASE, BLOOD: Lipase: 27 U/L (ref 11–51)

## 2021-01-23 MED ORDER — ACETAMINOPHEN 325 MG PO TABS
650.0000 mg | ORAL_TABLET | Freq: Once | ORAL | Status: AC
  Administered 2021-01-23: 650 mg via ORAL
  Filled 2021-01-23: qty 2

## 2021-01-23 MED ORDER — LACTATED RINGERS IV SOLN: INTRAVENOUS | Status: DC

## 2021-01-23 MED ORDER — TAMSULOSIN HCL 0.4 MG PO CAPS
0.4000 mg | ORAL_CAPSULE | Freq: Every day | ORAL | Status: DC
  Administered 2021-01-24 – 2021-01-27 (×3): 0.4 mg via ORAL
  Filled 2021-01-23 (×4): qty 1

## 2021-01-23 MED ORDER — LISINOPRIL 10 MG PO TABS
20.0000 mg | ORAL_TABLET | Freq: Every day | ORAL | Status: DC
  Administered 2021-01-24 – 2021-01-27 (×4): 20 mg via ORAL
  Filled 2021-01-23 (×4): qty 2

## 2021-01-23 MED ORDER — DEXTROSE IN LACTATED RINGERS 5 % IV SOLN: INTRAVENOUS | Status: DC

## 2021-01-23 MED ORDER — DEXTROSE 50 % IV SOLN: 0.0000 mL | INTRAVENOUS | Status: DC | PRN

## 2021-01-23 MED ORDER — TRAZODONE HCL 50 MG PO TABS
50.0000 mg | ORAL_TABLET | Freq: Every day | ORAL | Status: DC
  Administered 2021-01-23 – 2021-01-26 (×4): 50 mg via ORAL
  Filled 2021-01-23 (×4): qty 1

## 2021-01-23 MED ORDER — CITALOPRAM HYDROBROMIDE 20 MG PO TABS
10.0000 mg | ORAL_TABLET | Freq: Every day | ORAL | Status: DC
  Administered 2021-01-24 – 2021-01-27 (×4): 10 mg via ORAL
  Filled 2021-01-23 (×4): qty 1

## 2021-01-23 MED ORDER — GABAPENTIN 100 MG PO CAPS
100.0000 mg | ORAL_CAPSULE | Freq: Every day | ORAL | Status: DC
  Administered 2021-01-24 – 2021-01-27 (×4): 100 mg via ORAL
  Filled 2021-01-23 (×4): qty 1

## 2021-01-23 MED ORDER — INSULIN REGULAR(HUMAN) IN NACL 100-0.9 UT/100ML-% IV SOLN
INTRAVENOUS | Status: DC
  Administered 2021-01-23: 6.5 [IU]/h via INTRAVENOUS

## 2021-01-23 MED ORDER — DEXTROSE 50 % IV SOLN
0.0000 mL | INTRAVENOUS | Status: DC | PRN
Start: 2021-01-23 — End: 2021-01-27

## 2021-01-23 MED ORDER — INSULIN REGULAR(HUMAN) IN NACL 100-0.9 UT/100ML-% IV SOLN
INTRAVENOUS | Status: DC
  Administered 2021-01-23: 9.6 [IU]/h via INTRAVENOUS
  Filled 2021-01-23: qty 100

## 2021-01-23 MED ORDER — ENOXAPARIN SODIUM 40 MG/0.4ML ~~LOC~~ SOLN
40.0000 mg | SUBCUTANEOUS | Status: DC
  Administered 2021-01-23 – 2021-01-26 (×4): 40 mg via SUBCUTANEOUS
  Filled 2021-01-23 (×4): qty 0.4

## 2021-01-23 NOTE — ED Triage Notes (Signed)
Pt c/o bilateral leg swelling and abdominal swelling x months. Pt reports she has gained 20lbs in the last 4 days and it's difficult for her to breathe when she lays down.

## 2021-01-23 NOTE — ED Notes (Signed)
Date and time results received: 01/23/21 1839 (use smartphrase ".now" to insert current time)  Test: Glucose Critical Value: 781  Name of Provider Notified: Roderic Palau, MD  Orders Received? Or Actions Taken?:

## 2021-01-23 NOTE — ED Notes (Signed)
Date and time results received: 01/23/21 @ 1959(use smartphrase ".now" to insert current time)  Test:Venus Blood Gas Critical Value: O2<31.0 Name of Provider Notified:[facility] Orders Received? Or Actions Taken?: Actions Taken: None yet

## 2021-01-23 NOTE — ED Provider Notes (Signed)
[facility] EMERGENCY DEPARTMENT Provider Note   CSN: [id number] Arrival date & time: 01/23/21  1635     History Chief Complaint  Patient presents with  . Leg Swelling    Diamond [name] is a 25 y.o. female.  HPI   Patient with significant medical history of type 1 diabetes, bipolar, Crohn's disease, edema, GERD, pancreatitis, hypertension presents to the emergency department with chief complaint of limb edema.  Patient endorses that she has had swelling of her limbs since April of last year and with orthopnea with shortness of breath that has remained unchanged.  She endorses that she came in today because the swelling in her legs is the worst it has ever been, they are extremely tender legs.  She states that her legs feel like they are covered in cement, making it difficult to move and they started to weep today.  She denies recent trauma to the area, has no history of PEs or DVTs, she states she has been taking her medication as prescribed, currently taking Lasix and HCTZ.  Patient states she has been urinating frequently, denies hematuria, dysuria, does endorse that she has some left and right sided flank pain, she states this is been hurting for a year and has gotten worse after she fell 1 week ago.  Patient denies any alleviating factors.  Patient denies headaches, fevers, chills, shortness of breath, chest pain, nausea, vomiting, diarrhea.   After reviewing patient's chart she has been here multiple times for similar complaints, most recent was 2/15 where she is admitted for DKA at [facility], she had CTA of the chest which was unremarkable, last echo was 07/26 unremarkable.  Patient was given fluids, insulin drip and was discharged the next day.  I suspect patient's high blood sugar is secondary due to noncompliance, unknown etiology for edema.  Past Medical History:  Diagnosis Date  . ADHD (attention deficit hyperactivity disorder) 2007  . Allergy   . Asthma   . Bipolar  disorder ([place])   . Crohn's colitis ([place])    Dx Dr. Cherrie Gauze  . Depression    early childhood, trial of seroquel?  . Diabetic neuropathy ([place])   . Edema   . GERD (gastroesophageal reflux disease)   . Hypertension   . Pancreatitis   . Panic attacks   . PTSD (post-traumatic stress disorder)   . Stroke ([place]) 07/2020   R side  . Type 1 diabetes ([place]) 2008    Patient Active Problem List   Diagnosis Date Noted  . Crohn's disease ([place]) 12/08/2020  . Gastroparesis 12/08/2020  . Essential hypertension, benign 11/01/2020  . Edema 10/19/2020  . Anasarca 10/18/2020  . Hypoalbuminemia 10/18/2020  . Personal history of noncompliance with medical treatment, presenting hazards to health 10/12/2020  . Bilateral lower extremity edema 08/25/2020  . Pneumonia 08/25/2020  . Hyperglycemia 08/25/2020  . Eczema 10/23/2012  . Abdominal pain 08/27/2012  . GERD (gastroesophageal reflux disease) 07/25/2012  . Mood disorder ([place]) 07/09/2012  . Contraception management 07/09/2012  . History of Crohn's disease 07/09/2012  . Uncontrolled type 1 diabetes mellitus with hyperglycemia ([place]) 07/08/2012  . Asthma, persistent controlled 07/08/2012    Past Surgical History:  Procedure Laterality Date  . ADENOIDECTOMY    . HERNIA REPAIR    . TONSILLECTOMY       OB History   No obstetric history on file.     Family History  Problem Relation Age of Onset  . Hypertension Mother   . Diabetes  Mother   . Heart disease Mother   . Crohn's disease Father   . Alcohol abuse Father   . Drug abuse Father   . Mental illness Father   . Cancer Father   . Hypertension Maternal Aunt   . Seizures Maternal Aunt   . Depression Maternal Aunt   . Asthma Maternal Uncle   . Hypertension Maternal Uncle   . Cancer Maternal Grandmother   . Diabetes Maternal Grandmother   . Mental illness Maternal Grandfather     Social History   Tobacco Use  . Smoking status: Former Smoker    Types: Cigarettes     Quit date: 12/03/2008    Years since quitting: 12.1  . Smokeless tobacco: Never Used  Vaping Use  . Vaping Use: Never used  Substance Use Topics  . Alcohol use: No  . Drug use: No    Home Medications Prior to Admission medications   Medication Sig Start Date End Date Taking? Authorizing Provider  albuterol (VENTOLIN HFA) 108 (90 Base) MCG/ACT inhaler Inhale 2 puffs into the lungs every 6 (six) hours as needed. For shortness of breath Patient taking differently: Inhale 2 puffs into the lungs every 6 (six) hours as needed for shortness of breath or wheezing. For shortness of breath 10/21/20  Yes Emokpae, Courage, MD  alfuzosin (UROXATRAL) 10 MG 24 hr tablet Take 1 tablet (10 mg total) by mouth daily with breakfast. 11/24/20  Yes McKenzie, Candee Furbish, MD  citalopram (CELEXA) 10 MG tablet Take 10 mg by mouth daily.   Yes [provider]  furosemide (LASIX) 20 MG tablet Take 100 mg by mouth daily.   Yes [provider]  gabapentin (NEURONTIN) 100 MG capsule Take 1 capsule (100 mg total) by mouth daily. 10/13/20  Yes [name], PA-C  insulin lispro (HUMALOG KWIKPEN) 100 UNIT/ML KwikPen Inject 10-16 Units into the skin 3 (three) times daily before meals. Patient taking differently: Inject 10-16 Units into the skin 3 (three) times daily before meals. Per sliding scale 11/01/20  Yes Nida, Marella Chimes, MD  lisinopril (ZESTRIL) 20 MG tablet Take 1 tablet (20 mg total) by mouth daily. 11/01/20  Yes Nida, Marella Chimes, MD  ondansetron (ZOFRAN) 4 MG tablet Take 1 tablet (4 mg total) by mouth in the morning, at noon, in the evening, and at bedtime. Patient taking differently: Take 4 mg by mouth every 8 (eight) hours as needed for vomiting or nausea. 12/13/20  Yes Carver, Elon Alas, DO  potassium chloride (KLOR-CON) 10 MEQ tablet Take 1 tablet (10 mEq total) by mouth daily. Take While taking Lasix/furosemide 10/21/20  Yes Emokpae, Courage, MD  promethazine (PHENERGAN) 25 MG  tablet Take 1 tablet (25 mg total) by mouth every 8 (eight) hours as needed for nausea or vomiting. Patient taking differently: Take 25 mg by mouth every 6 (six) hours as needed for vomiting or nausea. 12/05/20  Yes [name], PA-C  tamsulosin (FLOMAX) 0.4 MG CAPS capsule Take 1 capsule (0.4 mg total) by mouth daily. 12/13/20  Yes McKenzie, Candee Furbish, MD  traZODone (DESYREL) 50 MG tablet Take 50 mg by mouth at bedtime.   Yes [provider]    Allergies    Amoxicillin, Dexamethasone, Doxycycline, Penicillins, Augmentin [amoxicillin-pot clavulanate], Ceftriaxone sodium in dextrose, Clavulanic acid, Adhesive [tape], Latex, Other, and Pineapple  Review of Systems   Review of Systems  Constitutional: Negative for chills and fever.  HENT: Negative for congestion and tinnitus.   Respiratory: Negative for shortness of breath.  Cardiovascular: Positive for leg swelling. Negative for chest pain.  Gastrointestinal: Positive for abdominal pain. Negative for diarrhea, nausea and vomiting.  Genitourinary: Positive for flank pain and frequency. Negative for difficulty urinating, enuresis, vaginal bleeding, vaginal discharge and vaginal pain.  Musculoskeletal: Negative for back pain.       Bilateral leg pain.  Skin: Negative for rash.  Neurological: Negative for dizziness.  Hematological: Does not bruise/bleed easily.    Physical Exam Updated Vital Signs BP (!) 165/117   Pulse (!) 112   Temp 98.8 F (37.1 C) (Oral)   Resp (!) 22   Ht 4' 11"  (1.499 m)   Wt 63 kg   LMP  (Within Years)   SpO2 100%   BMI 28.05 kg/m   Physical Exam Vitals and nursing note reviewed.  Constitutional:      General: She is not in acute distress.    Appearance: She is not ill-appearing.  HENT:     Head: Normocephalic and atraumatic.     Nose: No congestion.     Mouth/Throat:     Mouth: Mucous membranes are moist.     Pharynx: Oropharynx is clear. No oropharyngeal exudate or posterior  oropharyngeal erythema.  Eyes:     Conjunctiva/sclera: Conjunctivae normal.  Cardiovascular:     Rate and Rhythm: Regular rhythm. Tachycardia present.     Pulses: Normal pulses.     Heart sounds: No murmur heard. No friction rub. No gallop.   Pulmonary:     Effort: No respiratory distress.     Breath sounds: No wheezing, rhonchi or rales.  Abdominal:     General: There is distension.     Palpations: Abdomen is soft.     Tenderness: There is abdominal tenderness. There is right CVA tenderness and left CVA tenderness.     Comments: Patient abdomen is distended, normoactive bowel sounds, dull to percussion, she had left lower quadrant pain, negative peritoneal sign, negative Murphy sign or McBurney point.  There is no fluid wave present, she had positive CVA tenderness bilaterally.  Musculoskeletal:     Right lower leg: Edema present.     Left lower leg: Edema present.     Comments: Patient has noted 1+ pitting edema up into her mid thigh, she has full range of motion in her toes ankles and knees, neurovascular fully intact.  Skin:    General: Skin is warm and dry.  Neurological:     Mental Status: She is alert.  Psychiatric:        Mood and Affect: Mood normal.     ED Results / Procedures / Treatments   Labs (all labs ordered are listed, but only abnormal results are displayed) Labs Reviewed  COMPREHENSIVE METABOLIC PANEL - Abnormal; Notable for the following components:      Result Value   Sodium 125 (*)    Chloride 91 (*)    Glucose, Bld 781 (*)    Calcium 8.6 (*)    Albumin 2.4 (*)    All other components within normal limits  CBC WITH DIFFERENTIAL/PLATELET - Abnormal; Notable for the following components:   Eosinophils Absolute 0.6 (*)    All other components within normal limits  BLOOD GAS, VENOUS - Abnormal; Notable for the following components:   pCO2, Ven 63.9 (*)    pO2, Ven <31.0 (*)    Acid-Base Excess 5.7 (*)    All other components within normal limits   BASIC METABOLIC PANEL - Abnormal; Notable for the following components:  Sodium 127 (*)    Chloride 94 (*)    Glucose, Bld 733 (*)    Calcium 8.6 (*)    All other components within normal limits  CBG MONITORING, ED - Abnormal; Notable for the following components:   Glucose-Capillary >600 (*)    All other components within normal limits  CBG MONITORING, ED - Abnormal; Notable for the following components:   Glucose-Capillary >600 (*)    All other components within normal limits  CBG MONITORING, ED - Abnormal; Notable for the following components:   Glucose-Capillary 580 (*)    All other components within normal limits  CBG MONITORING, ED - Abnormal; Notable for the following components:   Glucose-Capillary 558 (*)    All other components within normal limits  CBG MONITORING, ED - Abnormal; Notable for the following components:   Glucose-Capillary 506 (*)    All other components within normal limits  RESP PANEL BY RT-PCR (FLU A&B, COVID) ARPGX2  BRAIN NATRIURETIC PEPTIDE  LIPASE, BLOOD  PREGNANCY, URINE  URINALYSIS, ROUTINE W REFLEX MICROSCOPIC  POC URINE PREG, ED    EKG None  Radiology DG Chest Port 1 View  Result Date: 01/23/2021 CLINICAL DATA:  Shortness of breath with lower extremity edema EXAM: PORTABLE CHEST 1 VIEW COMPARISON:  Chest radiograph January 17, 2021; CT angiogram chest January 17, 2021 FINDINGS: Lungs are clear. Heart size and pulmonary vascularity are normal. No adenopathy. No pneumothorax. No bone lesions. IMPRESSION: Lungs clear.  Cardiac silhouette normal. Electronically Signed   By: Lowella Grip III M.D.   On: 01/23/2021 19:26    Procedures .Critical Care Performed by: Marcello Fennel, PA-C Authorized by: Marcello Fennel, PA-C   Critical care provider statement:    Critical care time (minutes):  45   Critical care time was exclusive of:  Separately billable procedures and treating other patients   Critical care was necessary to  treat or prevent imminent or life-threatening deterioration of the following conditions:  Metabolic crisis   Critical care was time spent personally by me on the following activities:  Discussions with consultants, evaluation of patient's response to treatment, examination of patient, ordering and performing treatments and interventions, ordering and review of laboratory studies, ordering and review of radiographic studies, pulse oximetry, re-evaluation of patient's condition and review of old charts   I assumed direction of critical care for this patient from another provider in my specialty: yes       Medications Ordered in ED Medications  insulin regular, human (MYXREDLIN) 100 units/ 100 mL infusion (9.5 Units/hr Intravenous Rate/Dose Change 01/23/21 2032)  lactated ringers infusion ( Intravenous New Bag/Given 01/23/21 1857)  dextrose 5 % in lactated ringers infusion (0 mLs Intravenous Hold 01/23/21 1900)  dextrose 50 % solution 0-50 mL (has no administration in time range)  acetaminophen (TYLENOL) tablet 650 mg (650 mg Oral Given 01/23/21 2007)    ED Course  I have reviewed the triage vital signs and the nursing notes.  Pertinent labs & imaging results that were available during my care of the patient were reviewed by me and considered in my medical decision making (see chart for details).    MDM Rules/Calculators/A&P                          Initial impression-patient presents with leg swelling.  She is alert, does not appear in acute distress, vital signs show tachycardia, initial CBG shows glucose greater than 600.  Concern for possible  DKA, will obtain basic lab work, add on venous blood gas, UA, and order DVT study if available.  Work-up-CBC unremarkable.  CMP shows pseudonatremia, hyperglycemia of 781, hypoalbuminemia 2.4 appears to be a baseline for patient, no anion gap present.  BMP 44, lipase 27.  Venous blood gas shows pH 7.3, increased CO2 of 63.  Increase acid base of 5.7.   Chest x-ray negative for acute findings.  Consult due to patient is elevated glucose level will consult with hospitalist for possible admission.  Spoke with Dr. Su Ley who has accepted the patient.  She will come down and evaluate them.  Reassessment patient has elevated glucose of 781, will start patient on Endo tool and speak with patient about admission as she has extremely elevated glucose levels.  Patient is agreeable to hospital admission.  Rule out-I have low suspicion for PE or DVT as she has no history of it, is not currently on hormone therapy.  She is noted to have pedal edema but it is symmetrical, she has had this in the past, I suspect this is secondary due to hypoalbumin.  Low suspicion for CHF as there is no fluid within the lungs, last EF was 60-65, BMP is 44.  Low suspicion for systemic infection as patient is nontoxic-appearing, no signs of infection on exam or within lab work.  Low suspicion for pancreatitis as lipase is 27.  Low suspicion for DKA or HHS as there is no acidosis noted on blood gas or on CMP, there is no anion gap present.  Plan-I suspect patient's hyperglycemia secondary to noncompliance, anticipate she will need continuing insulin drip and further evaluation.  I suspect patient's edema is secondary due to poor oral intake as well as hypoalbumin.  Patient care will be turned over to hospitalist team for further evaluation.    Final Clinical Impression(s) / ED Diagnoses Final diagnoses:  Hyperglycemia  Peripheral edema  Hypoalbuminemia    Rx / DC Orders ED Discharge Orders    None       Aron Baba 01/23/21 2040    Milton Ferguson, MD 01/24/21 1155

## 2021-01-23 NOTE — H&P (Addendum)
History and Physical    Diamond [name] ZTI:[id number] DOB: Oct 11, 1996 DOA: 01/23/2021  PCP: [name], PA-C   Patient coming from: Home  I have personally briefly reviewed patient's old medical records in [place]  Chief Complaint: Difficulty breathing  HPI: Diamond [name] is a 25 y.o. female with medical history significant for diabetes mellitus,, Crohn's disease, hypertension, bipolar disorder. Patient presented to the ED with complaints of chronic difficulty breathing, abdominal swelling, leg swelling extending upwards to her groin-all these have been ongoing for several months.  She reports compliance with her Lasix 100 mg daily.  No chest pain. She reports compliance with her Lantus 44 units daily, and sliding scale insulin.  She believes the Lantus is making her have swelling in the legs and a rash, but she reports she has not stopped taking it last dose was last night.  ED Course: Temperature 98.5.  Heart rate 112, respiratory 22, blood pressure systolic 825K to 539J.  O2 sat 100% on room air.  Blood glucose 781.  Sodium 125.  Serum bicarb 27, anion gap of 7.  Serum albumin 2.4.  EKG shows sinus tachycardia.  Rl 125cc/hr. Insulin drip started.  Hospitalist to admit.  Review of Systems: As per HPI all other systems reviewed and negative.  Past Medical History:  Diagnosis Date  . ADHD (attention deficit hyperactivity disorder) 2007  . Allergy   . Asthma   . Bipolar disorder ([place])   . Crohn's colitis ([place])    Dx Dr. Cherrie Gauze  . Depression    early childhood, trial of seroquel?  . Diabetic neuropathy ([place])   . Edema   . GERD (gastroesophageal reflux disease)   . Hypertension   . Pancreatitis   . Panic attacks   . PTSD (post-traumatic stress disorder)   . Stroke ([place]) 07/2020   R side  . Type 1 diabetes ([place]) 2008    Past Surgical History:  Procedure Laterality Date  . ADENOIDECTOMY    . HERNIA REPAIR    . TONSILLECTOMY       reports  that she quit smoking about 12 years ago. Her smoking use included cigarettes. She has never used smokeless tobacco. She reports that she does not drink alcohol and does not use drugs.  Allergies  Allergen Reactions  . Amoxicillin Hives    Did it involve swelling of the face/tongue/throat, SOB, or low BP? no  Did it involve sudden or severe rash/hives, skin peeling, or any reaction on the inside of your mouth or nose? no Did you need to seek medical attention at a hospital or doctor's office? no When did it last happen?unk If all above answers are "NO", may proceed with cephalosporin use.  Marland Kitchen Dexamethasone Hives and Itching  . Doxycycline Itching and Swelling  . Penicillins Hives and Itching    Did it involve swelling of the face/tongue/throat, SOB, or low BP? no  Did it involve sudden or severe rash/hives, skin peeling, or any reaction on the inside of your mouth or nose? no Did you need to seek medical attention at a hospital or doctor's office? no When did it last happen?unk If all above answers are "NO", may proceed with cephalosporin use. Did it involve swelling of the face/tongue/throat, SOB, or low BP? no  Did it involve sudden or severe rash/hives, skin peeling, or any reaction on the inside of your mouth or nose? no Did you need to seek medical attention at a hospital or doctor's office? no When  did it last happen?unk If all above answers are "NO", may proceed with cephalosporin use. Tolerated keflex May 2021 with no issue.   . Augmentin [Amoxicillin-Pot Clavulanate] Other (See Comments)    Reaction unknown  . Ceftriaxone Sodium In Dextrose   . Clavulanic Acid   . Adhesive [Tape] Itching and Rash  . Latex Itching and Rash  . Other Itching and Rash  . Pineapple Rash    Rash on tongue and throat    Family History  Problem Relation Age of Onset  . Hypertension Mother   . Diabetes Mother   . Heart disease Mother   . Crohn's disease Father   . Alcohol  abuse Father   . Drug abuse Father   . Mental illness Father   . Cancer Father   . Hypertension Maternal Aunt   . Seizures Maternal Aunt   . Depression Maternal Aunt   . Asthma Maternal Uncle   . Hypertension Maternal Uncle   . Cancer Maternal Grandmother   . Diabetes Maternal Grandmother   . Mental illness Maternal Grandfather     Prior to Admission medications   Medication Sig Start Date End Date Taking? Authorizing Provider  albuterol (VENTOLIN HFA) 108 (90 Base) MCG/ACT inhaler Inhale 2 puffs into the lungs every 6 (six) hours as needed. For shortness of breath Patient taking differently: Inhale 2 puffs into the lungs every 6 (six) hours as needed for shortness of breath or wheezing. For shortness of breath 10/21/20  Yes Joon Pohle, Courage, MD  alfuzosin (UROXATRAL) 10 MG 24 hr tablet Take 1 tablet (10 mg total) by mouth daily with breakfast. 11/24/20  Yes McKenzie, Candee Furbish, MD  citalopram (CELEXA) 10 MG tablet Take 10 mg by mouth daily.   Yes [provider]  furosemide (LASIX) 20 MG tablet Take 100 mg by mouth daily.   Yes [provider]  gabapentin (NEURONTIN) 100 MG capsule Take 1 capsule (100 mg total) by mouth daily. 10/13/20  Yes [name], PA-C  insulin lispro (HUMALOG KWIKPEN) 100 UNIT/ML KwikPen Inject 10-16 Units into the skin 3 (three) times daily before meals. Patient taking differently: Inject 10-16 Units into the skin 3 (three) times daily before meals. Per sliding scale 11/01/20  Yes Nida, Marella Chimes, MD  lisinopril (ZESTRIL) 20 MG tablet Take 1 tablet (20 mg total) by mouth daily. 11/01/20  Yes Nida, Marella Chimes, MD  ondansetron (ZOFRAN) 4 MG tablet Take 1 tablet (4 mg total) by mouth in the morning, at noon, in the evening, and at bedtime. Patient taking differently: Take 4 mg by mouth every 8 (eight) hours as needed for vomiting or nausea. 12/13/20  Yes Carver, Elon Alas, DO  potassium chloride (KLOR-CON) 10 MEQ tablet Take 1 tablet  (10 mEq total) by mouth daily. Take While taking Lasix/furosemide 10/21/20  Yes Joee Iovine, Courage, MD  promethazine (PHENERGAN) 25 MG tablet Take 1 tablet (25 mg total) by mouth every 8 (eight) hours as needed for nausea or vomiting. Patient taking differently: Take 25 mg by mouth every 6 (six) hours as needed for vomiting or nausea. 12/05/20  Yes [name], PA-C  tamsulosin (FLOMAX) 0.4 MG CAPS capsule Take 1 capsule (0.4 mg total) by mouth daily. 12/13/20  Yes McKenzie, Candee Furbish, MD  traZODone (DESYREL) 50 MG tablet Take 50 mg by mouth at bedtime.   Yes [provider]    Physical Exam: Vitals:   01/23/21 1730 01/23/21 1730 01/23/21 2009 01/23/21 2031  BP:  (!) 147/102 (!) 165/117  Pulse:  (!) 112    Resp:  (!) 22    Temp:  98.5 F (36.9 C)  98.8 F (37.1 C)  TempSrc:  Oral  Oral  SpO2:  100%    Weight: 63 kg     Height: 4' 11"  (1.499 m)       Constitutional: NAD, calm, comfortable Vitals:   01/23/21 1730 01/23/21 1730 01/23/21 2009 01/23/21 2031  BP:  (!) 147/102 (!) 165/117   Pulse:  (!) 112    Resp:  (!) 22    Temp:  98.5 F (36.9 C)  98.8 F (37.1 C)  TempSrc:  Oral  Oral  SpO2:  100%    Weight: 63 kg     Height: 4' 11"  (1.499 m)      Eyes: PERRL, lids and conjunctivae normal ENMT: Mucous membranes are moist.   Neck: normal, supple, no masses, no thyromegaly Respiratory: clear to auscultation bilaterally, no wheezing, no crackles. Normal respiratory effort. No accessory muscle use.  Cardiovascular: Regular rate and rhythm, no murmurs / rubs / gallops. At least pitting 2+ extremity edema to thighs bilaterally, 2+ pedal pulses. Abdomen: Full, no tenderness, no masses palpated. No hepatosplenomegaly. Bowel sounds positive.  Musculoskeletal: no clubbing / cyanosis. No joint deformity upper and lower extremities. Good ROM, no contractures. Normal muscle tone.  Skin: no rashes, lesions, ulcers. No induration Neurologic: No apparent cranial problems, moving  extremities spontaneously. Psychiatric: Normal judgment and insight. Alert and oriented x 3. Normal mood.   Labs on Admission: I have personally reviewed following labs and imaging studies  CBC: Recent Labs  Lab 01/23/21 1806  WBC 6.9  NEUTROABS 4.3  HGB 12.3  HCT 37.0  MCV 88.3  PLT 286   Basic Metabolic Panel: Recent Labs  Lab 01/23/21 1806 01/23/21 1858  NA 125* 127*  K 4.4 4.8  CL 91* 94*  CO2 27 26  GLUCOSE 781* 733*  BUN 10 10  CREATININE 0.77 0.84  CALCIUM 8.6* 8.6*   GFR: Estimated Creatinine Clearance: 83.3 mL/min (by C-G formula based on SCr of 0.84 mg/dL). Liver Function Tests: Recent Labs  Lab 01/23/21 1806  AST 16  ALT 20  ALKPHOS 106  BILITOT 0.8  PROT 7.1  ALBUMIN 2.4*   Recent Labs  Lab 01/23/21 1806  LIPASE 27   CBG: Recent Labs  Lab 01/23/21 1735 01/23/21 1854 01/23/21 1926 01/23/21 2000 01/23/21 2030  GLUCAP >600* >600* 580* 558* 506*    Radiological Exams on Admission: DG Chest Port 1 View  Result Date: 01/23/2021 CLINICAL DATA:  Shortness of breath with lower extremity edema EXAM: PORTABLE CHEST 1 VIEW COMPARISON:  Chest radiograph January 17, 2021; CT angiogram chest January 17, 2021 FINDINGS: Lungs are clear. Heart size and pulmonary vascularity are normal. No adenopathy. No pneumothorax. No bone lesions. IMPRESSION: Lungs clear.  Cardiac silhouette normal. Electronically Signed   By: Lowella Grip III M.D.   On: 01/23/2021 19:26    EKG: Independently reviewed.  Sinus tachycardia rate 111, QTc 448.  No change from prior.  Assessment/Plan Principal Problem:   Uncontrolled type 1 diabetes mellitus with hyperglycemia (HCC) Active Problems:   History of Crohn's disease   Bilateral lower extremity edema   Anasarca   Hypoalbuminemia   Essential hypertension, benign   Hyperglycemia and uncontrolled type I diabetic-blood glucose 70-81, anion gap of 7, serum glucose of 27.  Not in DKA.  Historically not compliant with  medications, but patient today reports compliance.  A1c is 14.7. -Continue  insulin drip -Continue Ringer's lactate 100cc/hr - Addendum- glucose 150, D/c IVF fluids and transition to home lantus at reduced dose 30 u,in a.m and start SSi- M -Monitor BMP closely  Pseudohyponatremia-sodium 125, corrected sodium 136.  Anasarca-chronic, difficulty breathing likely secondary to abdominal swelling.  Likely due to hypoalbuminemia Albumin 2.4 today. Prior Protein creatinine ratio elevated at 2.72, and UA showing 3+ protein. ? early nephrotic syndrome 2/2 uncontrolled diabetes.  Reports compliance with 100 mg Lasix daily. LAst echo- 10/2020 EF 65 to 70% normal LV diastolic parameters. -IV fluids for now, -Consider starting IV Lasix 60 mg twice daily when blood glucose improved -Follow-up bilateral lower extremity venous ultrasound  Hypertension-elevated 140s to 160s. -Resume lisinopril, tamsulosin  Crohns dx- stable.  DVT prophylaxis: Lovenox Code Status: Full code Family Communication: Full code Disposition Plan: ~ 1-2 days Consults called: None Admission status: Inpt, step down I certify that at the point of admission it is my clinical judgment that the patient will require inpatient hospital care spanning beyond 2 midnights from the point of admission due to high intensity of service, high risk for further deterioration and high frequency of surveillance required.   Bethena Roys MD [facility]  01/23/2021, 9:08 PM

## 2021-01-23 NOTE — ED Notes (Signed)
Date and time results received: 01/23/21 1950 (use smartphrase ".now" to insert current time)  Test: glucose Critical Value: 733  Name of Provider Notified: william pa  Orders Received? Or Actions Taken?: acknowledged

## 2021-01-24 ENCOUNTER — Inpatient Hospital Stay (HOSPITAL_COMMUNITY): Payer: Self-pay

## 2021-01-24 DIAGNOSIS — E8809 Other disorders of plasma-protein metabolism, not elsewhere classified: Secondary | ICD-10-CM

## 2021-01-24 DIAGNOSIS — R601 Generalized edema: Secondary | ICD-10-CM

## 2021-01-24 DIAGNOSIS — I10 Essential (primary) hypertension: Secondary | ICD-10-CM

## 2021-01-24 DIAGNOSIS — R600 Localized edema: Secondary | ICD-10-CM

## 2021-01-24 DIAGNOSIS — Z8719 Personal history of other diseases of the digestive system: Secondary | ICD-10-CM

## 2021-01-24 DIAGNOSIS — R739 Hyperglycemia, unspecified: Secondary | ICD-10-CM

## 2021-01-24 LAB — GLUCOSE, CAPILLARY
Glucose-Capillary: 150 mg/dL — ABNORMAL HIGH (ref 70–99)
Glucose-Capillary: 110 mg/dL — ABNORMAL HIGH (ref 70–99)
Glucose-Capillary: 196 mg/dL — ABNORMAL HIGH (ref 70–99)
Glucose-Capillary: 215 mg/dL — ABNORMAL HIGH (ref 70–99)
Glucose-Capillary: 276 mg/dL — ABNORMAL HIGH (ref 70–99)

## 2021-01-24 LAB — BASIC METABOLIC PANEL
Anion gap: 6 (ref 5–15)
Anion gap: 6 (ref 5–15)
BUN: 8 mg/dL (ref 6–20)
BUN: 8 mg/dL (ref 6–20)
CO2: 25 mmol/L (ref 22–32)
CO2: 29 mmol/L (ref 22–32)
Calcium: 8.3 mg/dL — ABNORMAL LOW (ref 8.9–10.3)
Calcium: 8.6 mg/dL — ABNORMAL LOW (ref 8.9–10.3)
Chloride: 99 mmol/L (ref 98–111)
Chloride: 100 mmol/L (ref 98–111)
Creatinine, Ser: 0.68 mg/dL (ref 0.44–1.00)
Creatinine, Ser: 0.54 mg/dL (ref 0.44–1.00)
GFR, Estimated: >60 mL/min (ref >60)
GFR, Estimated: >60 mL/min (ref >60)
Glucose, Bld: 219 mg/dL — ABNORMAL HIGH (ref 70–99)
Glucose, Bld: 141 mg/dL — ABNORMAL HIGH (ref 70–99)
Potassium: 3.6 mmol/L (ref 3.5–5.1)
Potassium: 3.7 mmol/L (ref 3.5–5.1)
Sodium: 130 mmol/L — ABNORMAL LOW (ref 135–145)
Sodium: 135 mmol/L (ref 135–145)

## 2021-01-24 LAB — MRSA PCR SCREENING: MRSA by PCR: NEGATIVE

## 2021-01-24 MED ORDER — FUROSEMIDE 40 MG PO TABS
40.0000 mg | ORAL_TABLET | Freq: Two times a day (BID) | ORAL | Status: DC
  Administered 2021-01-24 (×2): 40 mg via ORAL
  Filled 2021-01-24 (×3): qty 1

## 2021-01-24 MED ORDER — ACETAMINOPHEN 325 MG PO TABS
650.0000 mg | ORAL_TABLET | Freq: Three times a day (TID) | ORAL | Status: DC | PRN
  Administered 2021-01-24 – 2021-01-27 (×4): 650 mg via ORAL
  Filled 2021-01-24 (×4): qty 2

## 2021-01-24 MED ORDER — INSULIN ASPART 100 UNIT/ML ~~LOC~~ SOLN
0.0000 [IU] | Freq: Every day | SUBCUTANEOUS | Status: DC
  Administered 2021-01-25: 4 [IU] via SUBCUTANEOUS
  Administered 2021-01-26: 5 [IU] via SUBCUTANEOUS
  Administered 2021-01-26: 2 [IU] via SUBCUTANEOUS

## 2021-01-24 MED ORDER — INSULIN GLARGINE 100 UNIT/ML ~~LOC~~ SOLN
30.0000 [IU] | Freq: Every day | SUBCUTANEOUS | Status: DC
  Administered 2021-01-24: 30 [IU] via SUBCUTANEOUS
  Filled 2021-01-24 (×3): qty 0.3

## 2021-01-24 MED ORDER — PROCHLORPERAZINE EDISYLATE 10 MG/2ML IJ SOLN
10.0000 mg | Freq: Four times a day (QID) | INTRAMUSCULAR | Status: DC | PRN
  Administered 2021-01-24 – 2021-01-27 (×5): 10 mg via INTRAVENOUS
  Filled 2021-01-24 (×4): qty 2

## 2021-01-24 MED ORDER — CHLORHEXIDINE GLUCONATE CLOTH 2 % EX PADS
6.0000 | MEDICATED_PAD | Freq: Every day | CUTANEOUS | Status: DC
  Administered 2021-01-24 – 2021-01-26 (×3): 6 via TOPICAL

## 2021-01-24 MED ORDER — PROSOURCE PLUS PO LIQD
30.0000 mL | Freq: Two times a day (BID) | ORAL | Status: DC
  Administered 2021-01-24 – 2021-01-27 (×4): 30 mL via ORAL
  Filled 2021-01-24 (×6): qty 30

## 2021-01-24 MED ORDER — POTASSIUM CHLORIDE CRYS ER 20 MEQ PO TBCR
20.0000 meq | EXTENDED_RELEASE_TABLET | Freq: Every day | ORAL | Status: DC
  Administered 2021-01-24 – 2021-01-27 (×4): 20 meq via ORAL
  Filled 2021-01-24 (×4): qty 1

## 2021-01-24 MED ORDER — INSULIN ASPART 100 UNIT/ML ~~LOC~~ SOLN
0.0000 [IU] | Freq: Three times a day (TID) | SUBCUTANEOUS | Status: DC
  Administered 2021-01-24: 13:00:00 5 [IU] via SUBCUTANEOUS
  Administered 2021-01-24: 8 [IU] via SUBCUTANEOUS
  Administered 2021-01-24: 3 [IU] via SUBCUTANEOUS
  Administered 2021-01-25 (×2): 8 [IU] via SUBCUTANEOUS
  Administered 2021-01-26 (×3): 3 [IU] via SUBCUTANEOUS
  Administered 2021-01-27: 5 [IU] via SUBCUTANEOUS

## 2021-01-24 NOTE — Progress Notes (Signed)
PROGRESS NOTE    [name]  TMA:[id number] DOB: June 02, 1996 DOA: 01/23/2021 PCP: [name], PA-C   Chief Complaint  Patient presents with  . Leg Swelling    Brief admission narrative:  As per H&P written by Dr. Denton Brick on 01/23/2021 Diamond [name] is a 25 y.o. female with medical history significant for diabetes mellitus,, Crohn's disease, hypertension, bipolar disorder. Patient presented to the ED with complaints of chronic difficulty breathing, abdominal swelling, leg swelling extending upwards to her groin-all these have been ongoing for several months.  She reports compliance with her Lasix 100 mg daily.  No chest pain. She reports compliance with her Lantus 44 units daily, and sliding scale insulin.  She believes the Lantus is making her have swelling in the legs and a rash, but she reports she has not stopped taking it last dose was last night.  ED Course: Temperature 98.5.  Heart rate 112, respiratory 22, blood pressure systolic 256L to 893T.  O2 sat 100% on room air.  Blood glucose 781.  Sodium 125.  Serum bicarb 27, anion gap of 7.  Serum albumin 2.4.  EKG shows sinus tachycardia.  Rl 125cc/hr. Insulin drip started.  Hospitalist to admit.  Assessment & Plan: 1-uncontrolled type 1 diabetes mellitus with hyperglycemia ([place]) -In the setting of medication noncompliance and diet indiscretion -Patient rapidly improved after using insulin drip -Currently with a stable CBGs levels while using Lantus and sliding scale insulin. -Continue modified carbohydrate diet -Continue to maintain adequate hydration -Follow CBGs closely for fluctuation over abnormalities.  2-History of Crohn's disease -Appears to be stable currently -Continue outpatient follow-up with gastroenterology.  3-Bilateral lower extremity edema/Anasarca -In the setting of hypoalbuminemia -Patient started on prostat  -resume BID lasix -follow renal function and electrolytes -started on TED  hoses  4-Essential hypertension, benign -stable overall -continue to follow VS -continue current antihypertensive agents  5-pseudohyponatremia -In the setting of hypoglycemia -Resolved after controlling patient's sugar level.  6-depression/anxiety -No suicidal ideation -Continue the use of citalopram and trazodone.  DVT prophylaxis: Lovenox Code Status: Full code Family Communication: No family at bedside. Disposition:   Status is: Inpatient  Dispo: The patient is from: Home              Anticipated d/c is to: home               Anticipated d/c date is: 1 day              Patient currently not medically stable, as she is still having CBG's fluctuation and ongoing anasarca/edema.   Difficult to place patient      Consultants:   None   Procedures:  See below for x-ray report.   Antimicrobials: None   Subjective: Afebrile, no chest pain, no nausea, no vomiting.  Reports feeling weak, complaining of neck pain and low back pain.  Still with ongoing swelling in her extremities and increased abdominal girth.  CBGs fluctuating, but off insulin drip currently.  Objective: Vitals:   01/24/21 1603 01/24/21 1700 01/24/21 1800 01/24/21 1830  BP:    (!) 106/55  Pulse: (!) 112 (!) 105 (!) 119 (!) 113  Resp: (!) 9 17 17 17   Temp: 99.5 F (37.5 C)     TempSrc: Oral     SpO2: 99% 96% 100% 100%  Weight:      Height:        Intake/Output Summary (Last 24 hours) at 01/24/2021 1925 Last data filed at 01/24/2021 1830 Gross per  24 hour  Intake 1009.83 ml  Output 600 ml  Net 409.83 ml   Filed Weights   01/23/21 1730 01/23/21 2300  Weight: 63 kg 63.4 kg    Examination: General exam: Appears calm and comfortable, no fever, no nausea, no vomiting. Respiratory system: Clear to auscultation. Respiratory effort normal. Cardiovascular system: S1 & S2 heard, RRR. No JVD, murmurs, rubs, gallops or clicks. No pedal edema. Gastrointestinal system: Abdomen is nondistended, soft  and nontender.  Increased abdominal girth appreciated.  No organomegaly or masses felt. Normal bowel sounds heard. Central nervous system: Alert and oriented. No focal neurological deficits. Extremities: 2+ edema bilaterally; no cyanosis or clubbing Skin: No petechiae. Psychiatry: Judgement and insight appear normal. Mood & affect appropriate.     Data Reviewed: I have personally reviewed following labs and imaging studies  CBC: Recent Labs  Lab 01/23/21 1806  WBC 6.9  NEUTROABS 4.3  HGB 12.3  HCT 37.0  MCV 88.3  PLT 277    Basic Metabolic Panel: Recent Labs  Lab 01/23/21 1806 01/23/21 1858 01/23/21 2349 01/24/21 0420  NA 125* 127* 130* 135  K 4.4 4.8 3.6 3.7  CL 91* 94* 99 100  CO2 27 26 25 29   GLUCOSE 781* 733* 219* 141*  BUN 10 10 8 8   CREATININE 0.77 0.84 0.68 0.54  CALCIUM 8.6* 8.6* 8.3* 8.6*    GFR: Estimated Creatinine Clearance: 87.8 mL/min (by C-G formula based on SCr of 0.54 mg/dL).  Liver Function Tests: Recent Labs  Lab 01/23/21 1806  AST 16  ALT 20  ALKPHOS 106  BILITOT 0.8  PROT 7.1  ALBUMIN 2.4*    CBG: Recent Labs  Lab 01/24/21 0109 01/24/21 0315 01/24/21 0753 01/24/21 1140 01/24/21 1602  GLUCAP 150* 110* 196* 215* 276*    Recent Results (from the past 240 hour(s))  Resp Panel by RT-PCR (Flu A&B, Covid) Nasopharyngeal Swab     Status: None   Collection Time: 01/23/21  7:26 PM   Specimen: Nasopharyngeal Swab; Nasopharyngeal(NP) swabs in vial transport medium  Result Value Ref Range Status   SARS Coronavirus 2 by RT PCR NEGATIVE NEGATIVE Final    Comment: (NOTE) SARS-CoV-2 target nucleic acids are NOT DETECTED.  The SARS-CoV-2 RNA is generally detectable in upper respiratory specimens during the acute phase of infection. The lowest concentration of SARS-CoV-2 viral copies this assay can detect is 138 copies/mL. A negative result does not preclude SARS-Cov-2 infection and should not be used as the sole basis for treatment  or other patient management decisions. A negative result may occur with  improper specimen collection/handling, submission of specimen other than nasopharyngeal swab, presence of viral mutation(s) within the areas targeted by this assay, and inadequate number of viral copies(<138 copies/mL). A negative result must be combined with clinical observations, patient history, and epidemiological information. The expected result is Negative.  Fact Sheet for Patients:  EntrepreneurPulse.com.au  Fact Sheet for Healthcare Providers:  IncredibleEmployment.be  This test is no t yet approved or cleared by the [place] FDA and  has been authorized for detection and/or diagnosis of SARS-CoV-2 by FDA under an Emergency Use Authorization (EUA). This EUA will remain  in effect (meaning this test can be used) for the duration of the COVID-19 declaration under Section 564(b)(1) of the Act, 21 U.S.C.section 360bbb-3(b)(1), unless the authorization is terminated  or revoked sooner.       Influenza A by PCR NEGATIVE NEGATIVE Final   Influenza B by PCR NEGATIVE NEGATIVE Final  Comment: (NOTE) The Xpert Xpress SARS-CoV-2/FLU/RSV plus assay is intended as an aid in the diagnosis of influenza from Nasopharyngeal swab specimens and should not be used as a sole basis for treatment. Nasal washings and aspirates are unacceptable for Xpert Xpress SARS-CoV-2/FLU/RSV testing.  Fact Sheet for Patients: EntrepreneurPulse.com.au  Fact Sheet for Healthcare Providers: IncredibleEmployment.be  This test is not yet approved or cleared by the [place] FDA and has been authorized for detection and/or diagnosis of SARS-CoV-2 by FDA under an Emergency Use Authorization (EUA). This EUA will remain in effect (meaning this test can be used) for the duration of the COVID-19 declaration under Section 564(b)(1) of the Act, 21 U.S.C. section  360bbb-3(b)(1), unless the authorization is terminated or revoked.  Performed at [facility], [address] [place], [place] [address]   MRSA PCR Screening     Status: None   Collection Time: 01/23/21 10:49 PM   Specimen: Nasal Mucosa; Nasopharyngeal  Result Value Ref Range Status   MRSA by PCR NEGATIVE NEGATIVE Final    Comment:        The GeneXpert MRSA Assay (FDA approved for NASAL specimens only), is one component of a comprehensive MRSA colonization surveillance program. It is not intended to diagnose MRSA infection nor to guide or monitor treatment for MRSA infections. Performed at [facility], [address], [place], [place] [address]      Radiology Studies: US Venous Img Lower Bilateral  Result Date: 01/24/2021 CLINICAL DATA:  Shortness of breath. Bilateral lower extremity swelling. EXAM: BILATERAL LOWER EXTREMITY VENOUS DOPPLER ULTRASOUND TECHNIQUE: Gray-scale sonography with compression, as well as color and duplex ultrasound, were performed to evaluate the deep venous system(s) from the level of the common femoral vein through the popliteal and proximal calf veins. COMPARISON:  No prior FINDINGS: VENOUS Normal compressibility of the common femoral, superficial femoral, and popliteal veins, as well as the visualized calf veins. Visualized portions of profunda femoral vein and great saphenous vein unremarkable. No filling defects to suggest DVT on grayscale or color Doppler imaging. Doppler waveforms show normal direction of venous flow, normal respiratory plasticity and response to augmentation. Limited views of the contralateral common femoral vein are unremarkable. OTHER Prominent bilateral inguinal lymph nodes noted. The largest is in right groin and measures 2.9 cm. Bilateral lower extremity edema noted. IMPRESSION: 1.  No evidence of DVT. 2. Prominent bilateral inguinal lymph nodes noted. The largest is in the right groin and measures 2.9 cm. Bilateral lower extremity  edema noted. Electronically Signed   By: Marcello Moores  Register   On: 01/24/2021 10:35   DG Chest Port 1 View  Result Date: 01/23/2021 CLINICAL DATA:  Shortness of breath with lower extremity edema EXAM: PORTABLE CHEST 1 VIEW COMPARISON:  Chest radiograph January 17, 2021; CT angiogram chest January 17, 2021 FINDINGS: Lungs are clear. Heart size and pulmonary vascularity are normal. No adenopathy. No pneumothorax. No bone lesions. IMPRESSION: Lungs clear.  Cardiac silhouette normal. Electronically Signed   By: Lowella Grip III M.D.   On: 01/23/2021 19:26    Scheduled Meds: . (feeding supplement) PROSource Plus  30 mL Oral BID BM  . Chlorhexidine Gluconate Cloth  6 each Topical Daily  . citalopram  10 mg Oral Daily  . enoxaparin (LOVENOX) injection  40 mg Subcutaneous Q24H  . furosemide  40 mg Oral BID  . gabapentin  100 mg Oral Daily  . insulin aspart  0-15 Units Subcutaneous TID WC  . insulin aspart  0-5 Units Subcutaneous QHS  .  insulin glargine  30 Units Subcutaneous Daily  . lisinopril  20 mg Oral Daily  . potassium chloride  20 mEq Oral Daily  . tamsulosin  0.4 mg Oral Daily  . traZODone  50 mg Oral QHS   Continuous Infusions:   LOS: 1 day    Time spent: 35 minutes.    Barton Dubois, MD [facility]   To contact the attending provider between 7A-7P or the covering provider during after hours 7P-7A, please log into the web site [email or web address] and access using universal [place] password for that web site. If you do not have the password, please call the hospital operator.  01/24/2021, 7:25 PM

## 2021-01-24 NOTE — Progress Notes (Addendum)
Inpatient Diabetes Program Recommendations  AACE/ADA: New Consensus Statement on Inpatient Glycemic Control  Target Ranges:  Prepandial:   less than 140 mg/dL      Peak postprandial:   less than 180 mg/dL (1-2 hours)      Critically ill patients:  140 - 180 mg/dL   Results for Diamond [name], Diamond [name] (MRN [id number]) as of 01/24/2021 07:31  Ref. Range 01/23/2021 17:35 01/23/2021 18:54 01/23/2021 19:26 01/23/2021 20:00 01/23/2021 20:30 01/23/2021 21:05 01/23/2021 21:06 01/23/2021 21:31 01/23/2021 22:35 01/23/2021 23:56 01/24/2021 01:09 01/24/2021 03:15  Glucose-Capillary Latest Ref Range: 70 - 99 mg/dL >600 (HH) >600 (HH) 580 (HH) 558 (HH) 506 (HH) 442 (H) 443 (H) 374 (H) 300 (H) 310 (H) 150 (H) 110 (H)  Results for [name], [name] (MRN [id number]) as of 01/24/2021 07:31  Ref. Range 01/23/2021 18:06  CO2 Latest Ref Range: 22 - 32 mmol/L 27  Glucose Latest Ref Range: 70 - 99 mg/dL 781 (HH)  Anion gap Latest Ref Range: 5 - 15  7   Review of Glycemic Control  Diabetes history: DM1 Outpatient Diabetes medications: Basaglar 40 units QHS, Humalog 10 units TID with meals, plus Humalog for corrrection Current orders for Inpatient glycemic control: Lantus 30 units Q24H, Novolog 0-15 units TID with meals   Inpatient Diabetes Program Recommendations:    Insulin: Per chart, no Lantus has been given prior to stopping IV insulin. Patient received Lantus 30 units today at 9:43 am.    NOTE: Patient was last inpatient 10/18/20-10/21/20 and inpatient diabetes coordinator spoke with patient on 10/19/20 during that admission.  Per chart, patient has DM1 and sees Dr. Dorris Fetch (Endocrinologist) and was last seen on 12/07/2020.  Per office note by Dr. Dorris Fetch on 12/07/2020, "shehas an aide from integrative medicine who promises to offer her help. She is advised to continue Basaglar 40 units nightly,continue Humalog 10units 3 times a day with meals for pre-meal BG readings of 90-118m/dl, plus patient specific correction dose for unexpected  hyperglycemia above 1539md."  Also noted telephone [facility] which notes "Pt was a no-show to her appointment on 01/05/21.  Attempt was made to get pt to reschedule her appointment but she refused and said she didn't want to come back in until April." Patient admitted 01/23/21 with hyperglycemia and anasarca with initial glucose 781 mg/dl on 01/23/21. Per H&P on 01/23/21, "She reports compliance with her Lantus 44 units daily, and sliding scale insulin.  She believes the Lantus is making her have swelling in the legs and a rash, but she reports she has not stopped taking it last dose was last night." Diabetes coordinator working remotely. Attempted to call patient's room and cell phone in chart several times but no answer on either. Sent communication to RN that I was trying to reach patient by phone but she was not answering. RN reports that patient stated she is sleepy and does not want to talk on the phone. Will attempt to contact her again tomorrow.  Thanks, MaBarnie AldermanRN, MSN, CDE Diabetes Coordinator Inpatient Diabetes Program [phone] Pager from 8am to 5pm)

## 2021-01-24 NOTE — Progress Notes (Signed)
Received orders from Hospitalist.    Completed as ordered.

## 2021-01-25 DIAGNOSIS — E1065 Type 1 diabetes mellitus with hyperglycemia: Principal | ICD-10-CM

## 2021-01-25 LAB — BASIC METABOLIC PANEL
Anion gap: 9 (ref 5–15)
BUN: 12 mg/dL (ref 6–20)
CO2: 29 mmol/L (ref 22–32)
Calcium: 8.6 mg/dL — ABNORMAL LOW (ref 8.9–10.3)
Chloride: 100 mmol/L (ref 98–111)
Creatinine, Ser: 0.79 mg/dL (ref 0.44–1.00)
GFR, Estimated: >60 mL/min (ref >60)
Glucose, Bld: 317 mg/dL — ABNORMAL HIGH (ref 70–99)
Potassium: 3.8 mmol/L (ref 3.5–5.1)
Sodium: 138 mmol/L (ref 135–145)

## 2021-01-25 LAB — MAGNESIUM: Magnesium: 1.8 mg/dL (ref 1.7–2.4)

## 2021-01-25 LAB — GLUCOSE, CAPILLARY
Glucose-Capillary: 108 mg/dL — ABNORMAL HIGH (ref 70–99)
Glucose-Capillary: 236 mg/dL — ABNORMAL HIGH (ref 70–99)
Glucose-Capillary: 259 mg/dL — ABNORMAL HIGH (ref 70–99)
Glucose-Capillary: 267 mg/dL — ABNORMAL HIGH (ref 70–99)
Glucose-Capillary: 313 mg/dL — ABNORMAL HIGH (ref 70–99)

## 2021-01-25 MED ORDER — INSULIN GLARGINE 100 UNIT/ML ~~LOC~~ SOLN
35.0000 [IU] | Freq: Every day | SUBCUTANEOUS | Status: DC
  Administered 2021-01-25 – 2021-01-27 (×3): 35 [IU] via SUBCUTANEOUS
  Filled 2021-01-25 (×7): qty 0.35

## 2021-01-25 MED ORDER — FUROSEMIDE 10 MG/ML IJ SOLN
60.0000 mg | Freq: Two times a day (BID) | INTRAMUSCULAR | Status: DC
  Administered 2021-01-25 – 2021-01-27 (×4): 60 mg via INTRAVENOUS
  Filled 2021-01-25 (×5): qty 6

## 2021-01-25 MED ORDER — PNEUMOCOCCAL VAC POLYVALENT 25 MCG/0.5ML IJ INJ
0.5000 mL | INJECTION | INTRAMUSCULAR | Status: AC
  Administered 2021-01-26: 0.5 mL via INTRAMUSCULAR
  Filled 2021-01-25: qty 0.5

## 2021-01-25 MED ORDER — LABETALOL HCL 5 MG/ML IV SOLN: 10.0000 mg | INTRAVENOUS | Status: DC | PRN

## 2021-01-25 MED ORDER — ALUM & MAG HYDROXIDE-SIMETH 200-200-20 MG/5ML PO SUSP
30.0000 mL | Freq: Four times a day (QID) | ORAL | Status: DC | PRN
  Administered 2021-01-25 – 2021-01-26 (×2): 30 mL via ORAL
  Filled 2021-01-25: qty 30

## 2021-01-25 MED ORDER — INSULIN ASPART 100 UNIT/ML ~~LOC~~ SOLN
6.0000 [IU] | Freq: Three times a day (TID) | SUBCUTANEOUS | Status: DC
  Administered 2021-01-25 – 2021-01-27 (×7): 6 [IU] via SUBCUTANEOUS

## 2021-01-25 NOTE — Progress Notes (Addendum)
Inpatient Diabetes Program Recommendations  AACE/ADA: New Consensus Statement on Inpatient Glycemic Control (  Target Ranges:  Prepandial:   less than 140 mg/dL      Peak postprandial:   less than 180 mg/dL (1-2 hours)      Critically ill patients:  140 - 180 mg/dL   Results for KRISTIA, JUPITER (MRN [id number]) as of 01/25/2021 08:09  Ref. Range 01/24/2021 07:53 01/24/2021 11:40 01/24/2021 16:02 01/25/2021 00:43  Glucose-Capillary Latest Ref Range: 70 - 99 mg/dL 196 (H) 215 (H) 276 (H) 313 (H)   Review of Glycemic Control  Diabetes history:DM1 Outpatient Diabetes medications:Basaglar 40 units QHS, Humalog 10 units TID with meals, plus Humalog for corrrection Current orders for Inpatient glycemic control:Lantus 30 units Q24H, Novolog 0-15 units TID with meals, Novolog 0-5 units QHS  Inpatient Diabetes Program Recommendations:    Insulin: Please consider increasing Lantus to 35 units Q24H and adding Novolog 6 units TID with meals for meal coverage if patient eats at least 50% of meals.  NOTE: Patient was last inpatient 10/18/20-10/21/20 and inpatient diabetes coordinator spoke with patient on 10/19/20 during that admission.  Per chart, patient has DM1 and sees Dr. Dorris Fetch (Endocrinologist) and was last seen on 12/07/2020.  Per office note by Dr. Dorris Fetch on 12/07/2020, "shehas an aide from integrative medicine who promises to offer her help. She is advised to continue Basaglar 40 units nightly,continue Humalog 10units 3 times a day with meals for pre-meal BG readings of 90-138m/dl, plus patient specific correction dose for unexpected hyperglycemia above 1538md."  Also noted telephone note by [facility]la [facility]PA [facility] 01/17/21 which notes "Pt was a no-show to her appointment on 01/05/21.  Attempt was made to get pt to reschedule her appointment but she refused and said she didn't want to come back in until April." Patient admitted 01/23/21 with hyperglycemia and anasarca with initial glucose  781 mg/dl on 01/23/21. Per H&P on 01/23/21, "She reports compliance with her Lantus 44 units daily, and sliding scale insulin.  She believes the Lantus is making her have swelling in the legs and a rash, but she reports she has not stopped taking it last dose was last night."   Called patient over the phone and spoke with her about DM. [facility] Dr. NiDorris Fetchor DM management. Patient reports that she is taking Basaglar 40 units QHS and Humalog TID with meals (per correction scale based on glucose). Patient reports that she has been taking Basaglar since August 2021 and she states that she feels the BaNancee Liters causing her rash (on butt and back) and causing her to swell. Patient states she is injecting insulin in her abdomen and arms. Patient reports that she has hardened areas on her abdomen where she injects. Discussed that hardened areas are likely scar tissue and she should not administer insulin in those areas as it may not be getting absorbed. Discussed insulin injection sites, rotating injection sites and asked patient to stay away from hardened areas completely. Patient states that she has plenty of insulin and testing supplies at home as she is able [facility] needed [facility] and [facility] that [facility] [place] to Lantus as she was taking before. Patient reports that her glucose is consistently 300-HI on glucometer and she is even waking up with glucose of 300 mg/dl or higher.  Discussed A1C of 14.7% (per Dr. Liliane Channel note on 12/07/20) and explained that current A1C indicates an average glucose of 375 mg/dl over the past 2-3 months. Discussed glucose and A1C goals. Discussed importance of checking CBGs and maintaining good CBG control to prevent long-term and short-term complications. Explained how hyperglycemia leads to damage within blood vessels which  lead to the common complications seen with uncontrolled diabetes. Stressed to the patient the importance of improving glycemic control to prevent further complications from uncontrolled diabetes especially given she is so young. Encouraged patient to check glucose at least 4 times per day (before meals and at bedtime) and to keep a log book of glucose readings and DM medication taken (exact dose) which patient will need to take to doctor appointments. Explained how the doctor can use the log book to continue to make adjustments with DM medications if needed. Informed patient that [facility] would [facility] and see if they are able to get her back on Lantus.  Patient verbalized understanding of information discussed and reports no further questions at this time related to diabetes. [facility] and spoke with [place]. Cyril Mourning reports that patient has been given Engineer, agricultural since that is what Dr. Dorris Fetch has prescribed for her. Asked if they were able to get Lantus for the patient. Was told that [facility] has [facility] and they could get patient signed up for Lantus. Cyril Mourning spoke with the provider there and was told that patient was a no show for her last appointment and she would not reschedule an appointment when she was called. [facility] is willing to give the patient samples of Lantus SoloStar if patient is given a prescription at discharge from the hospital, patient makes an appointment for follow up, and patient would need to follow through with keeping her appointment. Called patient back and informed her that the [facility] has samples of Lantus pens and that if she was prescribed Lantus pens at discharge they would give her samples as long as she would make a follow up appointment and be sure she kept the appointment with them. Patient verbalized understanding of information discussed and states she has no further questions at this time.    Thanks, Barnie Alderman, RN, MSN, CDE Diabetes  Coordinator Inpatient Diabetes Program [phone] (Team Pager from 8am to 5pm)

## 2021-01-25 NOTE — Progress Notes (Signed)
Bedside report given to C. Toney Rakes, Therapist, sports. Pt wheeled via [place] to room 311.

## 2021-01-25 NOTE — Progress Notes (Signed)
PROGRESS NOTE    Diamond [name]  BJY:[id number] DOB: [birth year] DOA: 01/23/2021 PCP: [name], PA-C   Brief Narrative:  As per H&P written by Dr. Denton Brick on 01/23/2021 Bonita Quin Wilsonis a 25 y.o.femalewith medical history significant fordiabetes mellitus,, Crohn's disease, hypertension, bipolar disorder. Patient presented to the ED with complaints of chronic difficulty breathing, abdominal swelling, leg swelling extending upwards to her groin-all these have been ongoing for several months.She reports compliance with her Lasix 100 mg daily. No chest pain. She reports compliance with her Lantus 44units daily, and sliding scale insulin. She believes the Lantus is making her have swelling in the legs and a rash,but she reports she has not stopped taking it last dose was last night.  ED Course:Temperature 98.5. Heart rate 112, respiratory 22, blood pressure systolic 086V to 784O. O2 sat 100% on room air. Blood glucose 781. Sodium 125. Serum bicarb 27, anion gap of 7. Serum albumin 2.4. EKG shows sinus tachycardia. Rl 125cc/hr.Insulin drip started. Hospitalist to admit.  -DKA has now resolved, but patient still quite volume overloaded and has ambulatory difficulties.  Assessment & Plan:   Principal Problem:   Uncontrolled type 1 diabetes mellitus with hyperglycemia (HCC) Active Problems:   History of Crohn's disease   Bilateral lower extremity edema   Anasarca   Hypoalbuminemia   Essential hypertension, benign   1-uncontrolled type 1 diabetes mellitus with hyperglycemia (HCC) -In the setting of medication noncompliance and diet indiscretion -Patient rapidly improved after using insulin drip -Currently with a stable CBGs levels while using Lantus and sliding scale insulin. -Continue modified carbohydrate diet -Continue to maintain adequate hydration -Follow CBGs closely for fluctuation over abnormalities. -Appreciate diabetes coordinator recommendations  with adjustment to insulin schedule made.  Plan to continue on Lantus at home and avoid Basaglar.  2-History of Crohn's disease -Appears to be stable currently -Continue outpatient follow-up with gastroenterology.  3-Bilateral lower extremity edema/Anasarca -In the setting of hypoalbuminemia -Patient started on prostat  -Twice daily Lasix to 60 mg IV -follow renal function and electrolytes -started on TED hoses -Monitor intake and output as well as daily weights -PT evaluation  4-Essential hypertension-elevated -Continues to remain elevated -continue to follow VS -continue current antihypertensive agents -Increase Lasix for diuresis which should be helpful -Labetalol as needed  5-pseudohyponatremia-resolved -In the setting of hypoglycemia -Resolved after controlling patient's sugar level.  6-depression/anxiety -No suicidal ideation -Continue the use of citalopram and trazodone.  DVT prophylaxis: Lovenox Code Status: Full code Family Communication: Discussed with Aunt on 2/23 Disposition:   Status is: Inpatient  Dispo: The patient is from: Home  Anticipated d/c is to: home   Anticipated d/c date is: 1-2 days  Patient currently not medically stable, as she is still having CBG's fluctuation and ongoing anasarca/edema.              Difficult to place patient    Consultants:   None   Procedures:  See below for x-ray report.   Antimicrobials: None   Subjective: Patient seen and evaluated today and continues to have swelling in her feet as well as up into her stomach.  She denies abdominal pain, nausea, or vomiting.  Objective: Vitals:   01/25/21 0800 01/25/21 0806 01/25/21 0900 01/25/21 1000  BP: (!) 149/105  (!) 150/97 (!) 147/113  Pulse: (!) 104  (!) 102 (!) 106  Resp: 16  10 12   Temp:  99 F (37.2 C)    TempSrc:  Axillary    SpO2: 99%  98% 99%  Weight:      Height:        Intake/Output Summary (Last 24  hours) at 01/25/2021 1040 Last data filed at 01/25/2021 0953 Gross per 24 hour  Intake 600 ml  Output 600 ml  Net 0 ml   Filed Weights   01/23/21 1730 01/23/21 2300  Weight: 63 kg 63.4 kg    Examination:  General exam: Appears calm and comfortable, somnolent Respiratory system: Clear to auscultation. Respiratory effort normal. Cardiovascular system: S1 & S2 heard, RRR.  Gastrointestinal system: Abdomen is minimally distended Central nervous system: Alert and awake Extremities: 2+ edema noted bilaterally to lower abdomen. Skin: No significant lesions noted Psychiatry: Flat affect.    Data Reviewed: I have personally reviewed following labs and imaging studies  CBC: Recent Labs  Lab 01/23/21 1806  WBC 6.9  NEUTROABS 4.3  HGB 12.3  HCT 37.0  MCV 88.3  PLT 703   Basic Metabolic Panel: Recent Labs  Lab 01/23/21 1806 01/23/21 1858 01/23/21 2349 01/24/21 0420 01/25/21 0834  NA 125* 127* 130* 135 138  K 4.4 4.8 3.6 3.7 3.8  CL 91* 94* 99 100 100  CO2 27 26 25 29 29   GLUCOSE 781* 733* 219* 141* 317*  BUN 10 10 8 8 12   CREATININE 0.77 0.84 0.68 0.54 0.79  CALCIUM 8.6* 8.6* 8.3* 8.6* 8.6*  MG  --   --   --   --  1.8   GFR: Estimated Creatinine Clearance: 87.8 mL/min (by C-G formula based on SCr of 0.79 mg/dL). Liver Function Tests: Recent Labs  Lab 01/23/21 1806  AST 16  ALT 20  ALKPHOS 106  BILITOT 0.8  PROT 7.1  ALBUMIN 2.4*   Recent Labs  Lab 01/23/21 1806  LIPASE 27   No results for input(s): AMMONIA in the last 168 hours. Coagulation Profile: No results for input(s): INR, PROTIME in the last 168 hours. Cardiac Enzymes: No results for input(s): CKTOTAL, CKMB, CKMBINDEX, TROPONINI in the last 168 hours. BNP (last 3 results) No results for input(s): PROBNP in the last 8760 hours. HbA1C: No results for input(s): HGBA1C in the last 72 hours. CBG: Recent Labs  Lab 01/24/21 0753 01/24/21 1140 01/24/21 1602 01/25/21 0043 01/25/21 0817  GLUCAP  196* 215* 276* 313* 259*   Lipid Profile: No results for input(s): CHOL, HDL, LDLCALC, TRIG, CHOLHDL, LDLDIRECT in the last 72 hours. Thyroid Function Tests: No results for input(s): TSH, T4TOTAL, FREET4, T3FREE, THYROIDAB in the last 72 hours. Anemia Panel: No results for input(s): VITAMINB12, FOLATE, FERRITIN, TIBC, IRON, RETICCTPCT in the last 72 hours. Sepsis Labs: No results for input(s): PROCALCITON, LATICACIDVEN in the last 168 hours.  Recent Results (from the past 240 hour(s))  Resp Panel by RT-PCR (Flu A&B, Covid) Nasopharyngeal Swab     Status: None   Collection Time: 01/23/21  7:26 PM   Specimen: Nasopharyngeal Swab; Nasopharyngeal(NP) swabs in vial transport medium  Result Value Ref Range Status   SARS Coronavirus 2 by RT PCR NEGATIVE NEGATIVE Final    Comment: (NOTE) SARS-CoV-2 target nucleic acids are NOT DETECTED.  The SARS-CoV-2 RNA is generally detectable in upper respiratory specimens during the acute phase of infection. The lowest concentration of SARS-CoV-2 viral copies this assay can detect is 138 copies/mL. A negative result does not preclude SARS-Cov-2 infection and should not be used as the sole basis for treatment or other patient management decisions. A negative result may occur with  improper specimen collection/handling, submission of specimen other than nasopharyngeal swab,  presence of viral mutation(s) within the areas targeted by this assay, and inadequate number of viral copies(<138 copies/mL). A negative result must be combined with clinical observations, patient history, and epidemiological information. The expected result is Negative.  Fact Sheet for Patients:  EntrepreneurPulse.com.au  Fact Sheet for Healthcare Providers:  IncredibleEmployment.be  This test is no t yet approved or cleared by the [place] FDA and  has been authorized for detection and/or diagnosis of SARS-CoV-2 by FDA under an  Emergency Use Authorization (EUA). This EUA will remain  in effect (meaning this test can be used) for the duration of the COVID-19 declaration under Section 564(b)(1) of the Act, 21 U.S.C.section 360bbb-3(b)(1), unless the authorization is terminated  or revoked sooner.       Influenza A by PCR NEGATIVE NEGATIVE Final   Influenza B by PCR NEGATIVE NEGATIVE Final    Comment: (NOTE) The Xpert Xpress SARS-CoV-2/FLU/RSV plus assay is intended as an aid in the diagnosis of influenza from Nasopharyngeal swab specimens and should not be used as a sole basis for treatment. Nasal washings and aspirates are unacceptable for Xpert Xpress SARS-CoV-2/FLU/RSV testing.  Fact Sheet for Patients: EntrepreneurPulse.com.au  Fact Sheet for Healthcare Providers: IncredibleEmployment.be  This test is not yet approved or cleared by the [place] FDA and has been authorized for detection and/or diagnosis of SARS-CoV-2 by FDA under an Emergency Use Authorization (EUA). This EUA will remain in effect (meaning this test can be used) for the duration of the COVID-19 declaration under Section 564(b)(1) of the Act, 21 U.S.C. section 360bbb-3(b)(1), unless the authorization is terminated or revoked.  Performed at [facility], [address], [place], [place] [address]   MRSA PCR Screening     Status: None   Collection Time: 01/23/21 10:49 PM   Specimen: Nasal Mucosa; Nasopharyngeal  Result Value Ref Range Status   MRSA by PCR NEGATIVE NEGATIVE Final    Comment:        The GeneXpert MRSA Assay (FDA approved for NASAL specimens only), is one component of a comprehensive MRSA colonization surveillance program. It is not intended to diagnose MRSA infection nor to guide or monitor treatment for MRSA infections. Performed at [facility], [address] [place], [place] [address]          Radiology Studies: US Venous Img Lower Bilateral  Result Date:  01/24/2021 CLINICAL DATA:  Shortness of breath. Bilateral lower extremity swelling. EXAM: BILATERAL LOWER EXTREMITY VENOUS DOPPLER ULTRASOUND TECHNIQUE: Gray-scale sonography with compression, as well as color and duplex ultrasound, were performed to evaluate the deep venous system(s) from the level of the common femoral vein through the popliteal and proximal calf veins. COMPARISON:  No prior FINDINGS: VENOUS Normal compressibility of the common femoral, superficial femoral, and popliteal veins, as well as the visualized calf veins. Visualized portions of profunda femoral vein and great saphenous vein unremarkable. No filling defects to suggest DVT on grayscale or color Doppler imaging. Doppler waveforms show normal direction of venous flow, normal respiratory plasticity and response to augmentation. Limited views of the contralateral common femoral vein are unremarkable. OTHER Prominent bilateral inguinal lymph nodes noted. The largest is in right groin and measures 2.9 cm. Bilateral lower extremity edema noted. IMPRESSION: 1.  No evidence of DVT. 2. Prominent bilateral inguinal lymph nodes noted. The largest is in the right groin and measures 2.9 cm. Bilateral lower extremity edema noted. Electronically Signed   By: Marcello Moores  Register   On: 01/24/2021 10:35   DG Chest Port 1  View  Result Date: 01/23/2021 CLINICAL DATA:  Shortness of breath with lower extremity edema EXAM: PORTABLE CHEST 1 VIEW COMPARISON:  Chest radiograph January 17, 2021; CT angiogram chest January 17, 2021 FINDINGS: Lungs are clear. Heart size and pulmonary vascularity are normal. No adenopathy. No pneumothorax. No bone lesions. IMPRESSION: Lungs clear.  Cardiac silhouette normal. Electronically Signed   By: Lowella Grip III M.D.   On: 01/23/2021 19:26        Scheduled Meds: . (feeding supplement) PROSource Plus  30 mL Oral BID BM  . Chlorhexidine Gluconate Cloth  6 each Topical Daily  . citalopram  10 mg Oral Daily  .  enoxaparin (LOVENOX) injection  40 mg Subcutaneous Q24H  . furosemide  60 mg Intravenous Q12H  . gabapentin  100 mg Oral Daily  . insulin aspart  0-15 Units Subcutaneous TID WC  . insulin aspart  0-5 Units Subcutaneous QHS  . insulin aspart  6 Units Subcutaneous TID WC  . insulin glargine  35 Units Subcutaneous Daily  . lisinopril  20 mg Oral Daily  . potassium chloride  20 mEq Oral Daily  . tamsulosin  0.4 mg Oral Daily  . traZODone  50 mg Oral QHS    LOS: 2 days    Time spent: 35 minutes    Giulia Hickey Darleen Crocker, DO [facility]  If 7PM-7AM, please contact night-coverage [email or web address] 01/25/2021, 10:40 AM

## 2021-01-26 LAB — BASIC METABOLIC PANEL
Anion gap: 8 (ref 5–15)
BUN: 14 mg/dL (ref 6–20)
CO2: 27 mmol/L (ref 22–32)
Calcium: 8.4 mg/dL — ABNORMAL LOW (ref 8.9–10.3)
Chloride: 102 mmol/L (ref 98–111)
Creatinine, Ser: 0.71 mg/dL (ref 0.44–1.00)
GFR, Estimated: >60 mL/min (ref >60)
Glucose, Bld: 196 mg/dL — ABNORMAL HIGH (ref 70–99)
Potassium: 4.5 mmol/L (ref 3.5–5.1)
Sodium: 137 mmol/L (ref 135–145)

## 2021-01-26 LAB — CBC
HCT: 33.8 % — ABNORMAL LOW (ref 36.0–46.0)
Hemoglobin: 11.1 g/dL — ABNORMAL LOW (ref 12.0–15.0)
MCH: 29.7 pg (ref 26.0–34.0)
MCHC: 32.8 g/dL (ref 30.0–36.0)
MCV: 90.4 fL (ref 80.0–100.0)
Platelets: 335 10*3/uL (ref 150–400)
RBC: 3.74 MIL/uL — ABNORMAL LOW (ref 3.87–5.11)
RDW: 12.7 % (ref 11.5–15.5)
WBC: 6.7 10*3/uL (ref 4.0–10.5)
nRBC: 0 % (ref 0.0–0.2)

## 2021-01-26 LAB — GLUCOSE, CAPILLARY
Glucose-Capillary: 151 mg/dL — ABNORMAL HIGH (ref 70–99)
Glucose-Capillary: 197 mg/dL — ABNORMAL HIGH (ref 70–99)
Glucose-Capillary: 164 mg/dL — ABNORMAL HIGH (ref 70–99)
Glucose-Capillary: 397 mg/dL — ABNORMAL HIGH (ref 70–99)

## 2021-01-26 LAB — MAGNESIUM: Magnesium: 1.9 mg/dL (ref 1.7–2.4)

## 2021-01-26 LAB — TROPONIN I (HIGH SENSITIVITY)
Troponin I (High Sensitivity): 9 ng/L (ref <18)
Troponin I (High Sensitivity): 9 ng/L (ref <18)

## 2021-01-26 MED ORDER — LACTATED RINGERS IV BOLUS
500.0000 mL | Freq: Once | INTRAVENOUS | Status: AC
  Administered 2021-01-26: 500 mL via INTRAVENOUS

## 2021-01-26 MED ORDER — TRAMADOL HCL 50 MG PO TABS
50.0000 mg | ORAL_TABLET | Freq: Once | ORAL | Status: AC
  Administered 2021-01-26: 50 mg via ORAL
  Filled 2021-01-26: qty 1

## 2021-01-26 NOTE — Evaluation (Signed)
Physical Therapy Evaluation Patient Details Name: Diamond [name] MRN: [id number] DOB: [birth year] Today's Date: 01/26/2021   History of Present Illness  Diamond [name] is a 25 y.o. female with medical history significant for diabetes mellitus,, Crohn's disease, hypertension, bipolar disorder.  Patient presented to the ED with complaints of chronic difficulty breathing, abdominal swelling, leg swelling extending upwards to her groin-all these have been ongoing for several months.  She reports compliance with her Lasix 100 mg daily.  No chest pain.  She reports compliance with her Lantus 44 units daily, and sliding scale insulin.  She believes the Lantus is making her have swelling in the legs and a rash, but she reports she has not stopped taking it last dose was last night.    Clinical Impression  Patient functioning near baseline for functional mobility and gait, had to use bed rail to help pull self to sitting, limited to ambulation in room mostly due to c/o chest pain and awaiting EKG.  Patient tolerated sitting up at bedside after therapy - RN aware.  Patient will benefit from continued physical therapy in hospital and recommended venue below to increase strength, balance, endurance for safe ADLs and gait.     Follow Up Recommendations Home health PT    Equipment Recommendations  None recommended by PT    Recommendations for Other Services       Precautions / Restrictions Precautions Precautions: None Restrictions Weight Bearing Restrictions: No      Mobility  Bed Mobility Overal bed mobility: Needs Assistance Bed Mobility: Supine to Sit;Sit to Supine     Supine to sit: Supervision;Modified independent (Device/Increase time) Sit to supine: Modified independent (Device/Increase time)   General bed mobility comments: increased time, had to use bed rail    Transfers Overall transfer level: Needs assistance Equipment used: None Transfers: Sit to/from [employer] Sit to Stand: Supervision Stand pivot transfers: Supervision       General transfer comment: slow labored movement  Ambulation/Gait Ambulation/Gait assistance: Supervision Gait Distance (Feet): 22 Feet Assistive device: None Gait Pattern/deviations: Decreased step length - right;Decreased step length - left;Decreased stride length Gait velocity: decreased   General Gait Details: slow slightly labored movement, limited mostly due to c/o chest pain and awaiting EKG  Stairs            Wheelchair Mobility    Modified Rankin (Stroke Patients Only)       Balance Overall balance assessment: No apparent balance deficits (not formally assessed)                                           Pertinent Vitals/Pain Pain Assessment: 0-10 Pain Score: 7  Pain Location: chest pain and stomach Pain Descriptors / Indicators: Aching;Discomfort Pain Intervention(s): Limited activity within patient's tolerance;Monitored during session    Home Living Family/patient expects to be discharged to:: Private residence Living Arrangements: Other relatives Engineer, petroleum) Available Help at Discharge: Family;Available PRN/intermittently Type of Home: House Home Access: Stairs to enter Entrance Stairs-Rails: None Entrance Stairs-Number of Steps: 2 Home Layout: Two level Home Equipment: None      Prior Function Level of Independence: Independent         Comments: household and short distanced community ambulator, does not drive     Hand Dominance        Extremity/Trunk Assessment   Upper Extremity Assessment Upper Extremity Assessment: Overall  WFL for tasks assessed    Lower Extremity Assessment Lower Extremity Assessment: Generalized weakness    Cervical / Trunk Assessment Cervical / Trunk Assessment: Normal  Communication   Communication: No difficulties  Cognition Arousal/Alertness: Awake/alert Behavior During Therapy: WFL for tasks  assessed/performed Overall Cognitive Status: Within Functional Limits for tasks assessed                                        General Comments      Exercises     Assessment/Plan    PT Assessment Patient needs continued PT services  PT Problem List Decreased strength;Decreased activity tolerance;Decreased balance;Decreased mobility       PT Treatment Interventions Functional mobility training;Therapeutic activities;Stair training;Gait training;Therapeutic exercise;Balance training;Patient/family education;DME instruction    PT Goals (Current goals can be found in the Care Plan section)  Acute Rehab PT Goals Patient Stated Goal: return home with family to assist PT Goal Formulation: With patient Time For Goal Achievement: 01/28/21 Potential to Achieve Goals: Good    Frequency Min 2X/week   Barriers to discharge        Co-evaluation               AM-PAC PT "6 Clicks" Mobility  Outcome Measure Help needed turning from your back to your side while in a flat bed without using bedrails?: None Help needed moving from lying on your back to sitting on the side of a flat bed without using bedrails?: A Little Help needed moving to and from a bed to a chair (including a wheelchair)?: A Little Help needed standing up from a chair using your arms (e.g., wheelchair or bedside chair)?: A Little Help needed to walk in hospital room?: A Little Help needed climbing 3-5 steps with a railing? : A Little 6 Click Score: 19    End of Session   Activity Tolerance: Patient tolerated treatment well;Patient limited by fatigue Patient left: in bed;with call bell/phone within reach Nurse Communication: Mobility status PT Visit Diagnosis: Unsteadiness on feet (R26.81);Other abnormalities of gait and mobility (R26.89);Muscle weakness (generalized) (M62.81)    Time: 1275-1700 PT Time Calculation (min) (ACUTE ONLY): 15 min   Charges:   PT Evaluation $PT Eval Low  Complexity: 1 Low PT Treatments $Therapeutic Activity: 8-22 mins        11:45 AM, 01/26/21 Lonell Grandchild, MPT Physical Therapist with [facility] [phone] office [phone] mobile phone

## 2021-01-26 NOTE — Progress Notes (Signed)
PROGRESS NOTE    Diamond [name]  EVO:[id number] DOB: 02/26/1996 DOA: 01/23/2021 PCP: [name], PA-C   Brief Narrative:  As per H&P written by Dr. D[place] Brick on 01/23/2021 Diamond Quin Wilsonis a 25 y.o.femalewith medical history significant fordiabetes mellitus,, Crohn's disease, hypertension, bipolar disorder. Patient presented to the ED with complaints of chronic difficulty breathing, abdominal swelling, leg swelling extending upwards to her groin-all these have been ongoing for several months.She reports compliance with her Lasix 100 mg daily. No chest pain. She reports compliance with her Lantus 44units daily, and sliding scale insulin. She believes the Lantus is making her have swelling in the legs and a rash,but she reports she has not stopped taking it last dose was last night.  ED Course:Temperature 98.5. Heart rate 112, respiratory 22, blood pressure systolic 299B to 716R. O2 sat 100% on room air. Blood glucose 781. Sodium 125. Serum bicarb 27, anion gap of 7. Serum albumin 2.4. EKG shows sinus tachycardia. Rl 125cc/hr.Insulin drip started. Hospitalist to admit.  -DKA has now resolved, but patient still quite volume overloaded and has ambulatory difficulties.  Assessment & Plan:   Principal Problem:   Uncontrolled type 1 diabetes mellitus with hyperglycemia (HCC) Active Problems:   History of Crohn's disease   Bilateral lower extremity edema   Anasarca   Hypoalbuminemia   Essential hypertension, benign   1-uncontrolled type 1 diabetes mellitus with hyperglycemia (HCC) -In the setting of medication noncompliance and diet indiscretion -Patient rapidly improved after using insulin drip -Currently with a stable CBGs levels while using Lantus and sliding scale insulin. -Continue modified carbohydrate diet -Continue to maintain adequate hydration -Follow CBGs closely for fluctuation over abnormalities. -Appreciate diabetes coordinator recommendations  with adjustment to insulin schedule made.  Plan to continue on Lantus at home and avoid Basaglar.  2-History of Crohn's disease -Appears to be stable currently -Continue outpatient follow-up with gastroenterology.  3-Bilateral lower extremity edema/Anasarca -In the setting of hypoalbuminemia -Patientstarted on prostat  -Twice daily Lasix to 60 mg IV -follow renal function and electrolytes -started on TED hoses -Monitor intake and output as well as daily weights -PT evaluation  4-Essential hypertension-elevated -Continues to remain elevated -continue to follow VS -continue current antihypertensive agents -Increase Lasix for diuresis which should be helpful -Labetalol as needed  5-pseudohyponatremia-resolved -In the setting of hypoglycemia -Resolved after controlling patient's sugar level.  6-depression/anxiety -No suicidal ideation -Continue theuse ofcitalopram and trazodone.  7-atypical CP with mild hypotension 2/24 -EKG and troponins unremarkable -Holding bp meds this am  DVT prophylaxis:Lovenox Code Status:Full code Family Communication:Discussed with Aunt on 2/23 Disposition:  Status is: Inpatient  Dispo: The patient is from:Home Anticipated d/c is CV:ELFY Anticipated d/c date is: 1-2 days Patient currently not medically stable, as she is still having CBG's fluctuation and ongoing anasarca/edema. Difficult to place patient   Consultants:  None   Procedures: See below for x-ray report.   Antimicrobials: None  Subjective: Patient seen and evaluated today with some mild chest pain and low blood pressure readings noted this morning.  She was able to eat breakfast and denies any abdominal pain, nausea, or vomiting.  Objective: Vitals:   01/26/21 0300 01/26/21 0500 01/26/21 0821 01/26/21 1027  BP:  (!) 127/100 (!) 89/62 124/89  Pulse:  (!) 109 (!) 108 (!) 114  Resp: 18 20  16    Temp:  98.3 F (36.8 C)    TempSrc:  Oral    SpO2:  100% 98% 100%  Weight:      Height:  Intake/Output Summary (Last 24 hours) at 01/26/2021 1103 Last data filed at 01/26/2021 0900 Gross per 24 hour  Intake 480 ml  Output 750 ml  Net -270 ml   Filed Weights   01/23/21 1730 01/23/21 2300  Weight: 63 kg 63.4 kg    Examination:  General exam: Appears calm and comfortable  Respiratory system: Clear to auscultation. Respiratory effort normal. Cardiovascular system: S1 & S2 heard, RRR.  Gastrointestinal system: Abdomen is soft Central nervous system: Alert and awake Extremities: Edema noted to bilateral ankles. Skin: No significant lesions noted Psychiatry: Flat affect.    Data Reviewed: I have personally reviewed following labs and imaging studies  CBC: Recent Labs  Lab 01/23/21 1806 01/26/21 0420  WBC 6.9 6.7  NEUTROABS 4.3  --   HGB 12.3 11.1*  HCT 37.0 33.8*  MCV 88.3 90.4  PLT 383 035   Basic Metabolic Panel: Recent Labs  Lab 01/23/21 1858 01/23/21 2349 01/24/21 0420 01/25/21 0834 01/26/21 0420  NA 127* 130* 135 138 137  K 4.8 3.6 3.7 3.8 4.5  CL 94* 99 100 100 102  CO2 26 25 29 29 27   GLUCOSE 733* 219* 141* 317* 196*  BUN 10 8 8 12 14   CREATININE 0.84 0.68 0.54 0.79 0.71  CALCIUM 8.6* 8.3* 8.6* 8.6* 8.4*  MG  --   --   --  1.8 1.9   GFR: Estimated Creatinine Clearance: 87.8 mL/min (by C-G formula based on SCr of 0.71 mg/dL). Liver Function Tests: Recent Labs  Lab 01/23/21 1806  AST 16  ALT 20  ALKPHOS 106  BILITOT 0.8  PROT 7.1  ALBUMIN 2.4*   Recent Labs  Lab 01/23/21 1806  LIPASE 27   No results for input(s): AMMONIA in the last 168 hours. Coagulation Profile: No results for input(s): INR, PROTIME in the last 168 hours. Cardiac Enzymes: No results for input(s): CKTOTAL, CKMB, CKMBINDEX, TROPONINI in the last 168 hours. BNP (last 3 results) No results for input(s): PROBNP in the last 8760 hours. HbA1C: No results for  input(s): HGBA1C in the last 72 hours. CBG: Recent Labs  Lab 01/25/21 0817 01/25/21 1125 01/25/21 1624 01/25/21 2050 01/26/21 0729  GLUCAP 259* 267* 108* 236* 151*   Lipid Profile: No results for input(s): CHOL, HDL, LDLCALC, TRIG, CHOLHDL, LDLDIRECT in the last 72 hours. Thyroid Function Tests: No results for input(s): TSH, T4TOTAL, FREET4, T3FREE, THYROIDAB in the last 72 hours. Anemia Panel: No results for input(s): VITAMINB12, FOLATE, FERRITIN, TIBC, IRON, RETICCTPCT in the last 72 hours. Sepsis Labs: No results for input(s): PROCALCITON, LATICACIDVEN in the last 168 hours.  Recent Results (from the past 240 hour(s))  Resp Panel by RT-PCR (Flu A&B, Covid) Nasopharyngeal Swab     Status: None   Collection Time: 01/23/21  7:26 PM   Specimen: Nasopharyngeal Swab; Nasopharyngeal(NP) swabs in vial transport medium  Result Value Ref Range Status   SARS Coronavirus 2 by RT PCR NEGATIVE NEGATIVE Final    Comment: (NOTE) SARS-CoV-2 target nucleic acids are NOT DETECTED.  The SARS-CoV-2 RNA is generally detectable in upper respiratory specimens during the acute phase of infection. The lowest concentration of SARS-CoV-2 viral copies this assay can detect is 138 copies/mL. A negative result does not preclude SARS-Cov-2 infection and should not be used as the sole basis for treatment or other patient management decisions. A negative result may occur with  improper specimen collection/handling, submission of specimen other than nasopharyngeal swab, presence of viral mutation(s) within the areas targeted by this  assay, and inadequate number of viral copies(<138 copies/mL). A negative result must be combined with clinical observations, patient history, and epidemiological information. The expected result is Negative.  Fact Sheet for Patients:  EntrepreneurPulse.com.au  Fact Sheet for Healthcare Providers:  IncredibleEmployment.be  This test is no  t yet approved or cleared by the [place] FDA and  has been authorized for detection and/or diagnosis of SARS-CoV-2 by FDA under an Emergency Use Authorization (EUA). This EUA will remain  in effect (meaning this test can be used) for the duration of the COVID-19 declaration under Section 564(b)(1) of the Act, 21 U.S.C.section 360bbb-3(b)(1), unless the authorization is terminated  or revoked sooner.       Influenza A by PCR NEGATIVE NEGATIVE Final   Influenza B by PCR NEGATIVE NEGATIVE Final    Comment: (NOTE) The Xpert Xpress SARS-CoV-2/FLU/RSV plus assay is intended as an aid in the diagnosis of influenza from Nasopharyngeal swab specimens and should not be used as a sole basis for treatment. Nasal washings and aspirates are unacceptable for Xpert Xpress SARS-CoV-2/FLU/RSV testing.  Fact Sheet for Patients: EntrepreneurPulse.com.au  Fact Sheet for Healthcare Providers: IncredibleEmployment.be  This test is not yet approved or cleared by the [place] FDA and has been authorized for detection and/or diagnosis of SARS-CoV-2 by FDA under an Emergency Use Authorization (EUA). This EUA will remain in effect (meaning this test can be used) for the duration of the COVID-19 declaration under Section 564(b)(1) of the Act, 21 U.S.C. section 360bbb-3(b)(1), unless the authorization is terminated or revoked.  Performed at [facility], [address] [place], [place] [address]   MRSA PCR Screening     Status: None   Collection Time: 01/23/21 10:49 PM   Specimen: Nasal Mucosa; Nasopharyngeal  Result Value Ref Range Status   MRSA by PCR NEGATIVE NEGATIVE Final    Comment:        The GeneXpert MRSA Assay (FDA approved for NASAL specimens only), is one component of a comprehensive MRSA colonization surveillance program. It is not intended to diagnose MRSA infection nor to guide or monitor treatment for MRSA infections. Performed at  [facility], [address] [place], [place] [address]          Radiology Studies: No results found.      Scheduled Meds: . (feeding supplement) PROSource Plus  30 mL Oral BID BM  . Chlorhexidine Gluconate Cloth  6 each Topical Daily  . citalopram  10 mg Oral Daily  . enoxaparin (LOVENOX) injection  40 mg Subcutaneous Q24H  . furosemide  60 mg Intravenous Q12H  . gabapentin  100 mg Oral Daily  . insulin aspart  0-15 Units Subcutaneous TID WC  . insulin aspart  0-5 Units Subcutaneous QHS  . insulin aspart  6 Units Subcutaneous TID WC  . insulin glargine  35 Units Subcutaneous Daily  . lisinopril  20 mg Oral Daily  . pneumococcal 23 valent vaccine  0.5 mL Intramuscular Tomorrow-1000  . potassium chloride  20 mEq Oral Daily  . tamsulosin  0.4 mg Oral Daily  . traZODone  50 mg Oral QHS    LOS: 3 days    Time spent: 35 minutes    Latalia Etzler Darleen Crocker, DO [facility]  If 7PM-7AM, please contact night-coverage [email or web address] 01/26/2021, 11:03 AM

## 2021-01-26 NOTE — Progress Notes (Signed)
   01/26/21 0849  Level of Consciousness  Level of Consciousness Alert  MEWS COLOR  MEWS Score Color Yellow  MEWS Score  MEWS Temp 0  MEWS Systolic 1  MEWS Pulse 1  MEWS RR 0  MEWS LOC 0  MEWS Score 2  Provider Notification  Provider Name/Title Manuella Ghazi MD  Date Provider Notified 01/26/21  Time Provider Notified [phone]  Notification Type Page  Notification Reason Other (Comment) (BP low and chest pain)  Provider response At bedside;Other (Comment) (Held lasix and BP medication)  Date of Provider Response 01/26/21  Time of Provider Response [phone]

## 2021-01-26 NOTE — Plan of Care (Signed)
  Problem: Acute Rehab PT Goals(only PT should resolve) Goal: Pt Will Go Supine/Side To Sit Outcome: Progressing Flowsheets (Taken 01/26/2021 1147) Pt will go Supine/Side to Sit:  with modified independence  Independently Goal: Patient Will Transfer Sit To/From Stand Outcome: Progressing Flowsheets (Taken 01/26/2021 1147) Patient will transfer sit to/from stand: with modified independence Goal: Pt Will Transfer Bed To Chair/Chair To Bed Outcome: Progressing Flowsheets (Taken 01/26/2021 1147) Pt will Transfer Bed to Chair/Chair to Bed: with modified independence Goal: Pt Will Ambulate Outcome: Progressing Flowsheets (Taken 01/26/2021 1147) Pt will Ambulate:  50 feet  with modified independence  with least restrictive assistive device   11:47 AM, 01/26/21 Lonell Grandchild, MPT Physical Therapist with [facility] [phone] office [phone] mobile phone

## 2021-01-27 ENCOUNTER — Telehealth: Payer: Self-pay | Admitting: *Deleted

## 2021-01-27 LAB — BASIC METABOLIC PANEL
Anion gap: 5 (ref 5–15)
BUN: 19 mg/dL (ref 6–20)
CO2: 29 mmol/L (ref 22–32)
Calcium: 8.3 mg/dL — ABNORMAL LOW (ref 8.9–10.3)
Chloride: 98 mmol/L (ref 98–111)
Creatinine, Ser: 0.88 mg/dL (ref 0.44–1.00)
GFR, Estimated: >60 mL/min (ref >60)
Glucose, Bld: 257 mg/dL — ABNORMAL HIGH (ref 70–99)
Potassium: 4.2 mmol/L (ref 3.5–5.1)
Sodium: 132 mmol/L — ABNORMAL LOW (ref 135–145)

## 2021-01-27 LAB — GLUCOSE, CAPILLARY
Glucose-Capillary: 106 mg/dL — ABNORMAL HIGH (ref 70–99)
Glucose-Capillary: 209 mg/dL — ABNORMAL HIGH (ref 70–99)
Glucose-Capillary: 368 mg/dL — ABNORMAL HIGH (ref 70–99)

## 2021-01-27 LAB — MAGNESIUM: Magnesium: 1.8 mg/dL (ref 1.7–2.4)

## 2021-01-27 MED ORDER — INSULIN GLARGINE 100 UNIT/ML ~~LOC~~ SOLN: 35.0000 [IU] | Freq: Every day | SUBCUTANEOUS | 11 refills | Status: DC

## 2021-01-27 MED ORDER — PEN NEEDLES 31G X 5 MM MISC: 3 refills | Status: DC

## 2021-01-27 NOTE — Progress Notes (Signed)
Physical Therapy Treatment Patient Details Name: Diamond [name] MRN: [id number] DOB: 1996/08/20 Today's Date: 01/27/2021    History of Present Illness Diamond [name] is a 25 y.o. female with medical history significant for diabetes mellitus,, Crohn's disease, hypertension, bipolar disorder.  Patient presented to the ED with complaints of chronic difficulty breathing, abdominal swelling, leg swelling extending upwards to her groin-all these have been ongoing for several months.  She reports compliance with her Lasix 100 mg daily.  No chest pain.  She reports compliance with her Lantus 44 units daily, and sliding scale insulin.  She believes the Lantus is making her have swelling in the legs and a rash, but she reports she has not stopped taking it last dose was last night.    PT Comments    Patient seated EOB at beginning of session with c/o LE swelling. Patient states she took her ted hose about an hour before. Patient educated on importance of using compression consistently for reducing edema. Assisted patient in donning TED hose and socks. Patient transfers to standing without AD with unsteadiness upon standing reaching for objects/walls for support. She ambulates with slow, labored, unsteady cadence reaching for walls/objects frequently for support. She states intermittent dizziness with ambulating. Patient assisted back to sitting EOB at end of session. Patient will benefit from continued physical therapy in hospital and recommended venue below to increase strength, balance, endurance for safe ADLs and gait.   Follow Up Recommendations  Home health PT     Equipment Recommendations  None recommended by PT    Recommendations for Other Services       Precautions / Restrictions Precautions Precautions: Fall Restrictions Weight Bearing Restrictions: No    Mobility  Bed Mobility Overal bed mobility: Needs Assistance       Supine to sit: Modified independent (Device/Increase  time)     General bed mobility comments: increased time with bed rail    Transfers Overall transfer level: Needs assistance Equipment used: None Transfers: Sit to/from [employer] Transfers   Stand pivot transfers: Min guard       General transfer comment: slow labored movement with unsteadiness  Ambulation/Gait Ambulation/Gait assistance: Min guard Gait Distance (Feet): 75 Feet Assistive device: None Gait Pattern/deviations: Decreased step length - right;Decreased step length - left;Decreased stride length;Staggering left;Staggering right Gait velocity: decreased   General Gait Details: slow, labored, unsteady cadence reaching for nearby walls/objects for support without AD, c/o of intermittent dizziness   Stairs             Wheelchair Mobility    Modified Rankin (Stroke Patients Only)       Balance Overall balance assessment: Needs assistance Sitting-balance support: No upper extremity supported Sitting balance-Leahy Scale: Normal Sitting balance - Comments: seated EOB   Standing balance support: No upper extremity supported Standing balance-Leahy Scale: Fair Standing balance comment: without AD                            Cognition Arousal/Alertness: Awake/alert Behavior During Therapy: WFL for tasks assessed/performed Overall Cognitive Status: Within Functional Limits for tasks assessed                                        Exercises      General Comments        Pertinent Vitals/Pain Pain Assessment: Faces Faces Pain Scale: Hurts  little more Pain Location: chest pain and stomach Pain Descriptors / Indicators: Aching;Discomfort Pain Intervention(s): Limited activity within patient's tolerance;Monitored during session    Home Living                      Prior Function            PT Goals (current goals can now be found in the care plan section) Acute Rehab PT Goals Patient Stated Goal: return  home with family to assist PT Goal Formulation: With patient Time For Goal Achievement: 01/28/21 Potential to Achieve Goals: Good Progress towards PT goals: Progressing toward goals    Frequency    Min 2X/week      PT Plan      Co-evaluation              AM-PAC PT "6 Clicks" Mobility   Outcome Measure  Help needed turning from your back to your side while in a flat bed without using bedrails?: None Help needed moving from lying on your back to sitting on the side of a flat bed without using bedrails?: A Little Help needed moving to and from a bed to a chair (including a wheelchair)?: A Little Help needed standing up from a chair using your arms (e.g., wheelchair or bedside chair)?: A Little Help needed to walk in hospital room?: A Little Help needed climbing 3-5 steps with a railing? : A Little 6 Click Score: 19    End of Session   Activity Tolerance: Patient tolerated treatment well;Patient limited by fatigue Patient left: in bed;with call bell/phone within reach Nurse Communication: Mobility status PT Visit Diagnosis: Unsteadiness on feet (R26.81);Other abnormalities of gait and mobility (R26.89);Muscle weakness (generalized) (M62.81)     Time: 4259-5638 PT Time Calculation (min) (ACUTE ONLY): 15 min  Charges:  $Therapeutic Activity: 8-22 mins                      9:16 AM, 01/27/21 Mearl Latin PT, DPT Physical Therapist at [facility]

## 2021-01-27 NOTE — Telephone Encounter (Signed)
fowarding to Dr. Abbey Chatters as an Juluis Rainier

## 2021-01-27 NOTE — TOC Transition Note (Signed)
Transition of Care [facility]) - CM/SW Discharge Note   Patient Details  Name: JAMARIYA DAVIDOFF MRN: [id number] Date of Birth: [birth year]  Transition of Care [facility]) CM/SW Contact:  Boneta Lucks, RN Phone Number: 01/27/2021, 11:52 AM   Clinical Narrative:   Patient admitted with Uncontrolled type 1 diabetes. Patient has a high risk for readmission. Patient goes to free clinic, next appointment  In one week. MD is ordering HHrn/PT/AIde.  Patients medicaid is still pending. TOC called [facility] for charity, they have no availability. Also checked to pair with no agency to accept. Explain to patient as soon as she gets her medicaid card to have PCP to order home health. Patient states she will have someone drive her to outpatient PT. TOC will ask for orders.    Final next level of care: Home/Self Care Barriers to Discharge: No [place] will accept this patient   Patient Goals and CMS Choice   CMS Medicare.gov Compare Post Acute Care list provided to:: Patient    Discharge Placement    Patient and family notified of of transfer: 01/27/21   Readmission Risk Interventions Readmission Risk Prevention Plan 01/27/2021  Transportation Screening Complete  PCP or Specialist Appt within 3-5 Days Complete  HRI or [place] Complete  Social Work Consult for [place] Planning/Counseling Complete  Palliative Care Screening Not Applicable  Medication Review Press photographer) Complete  Some recent data might be hidden

## 2021-01-27 NOTE — Plan of Care (Signed)

## 2021-01-27 NOTE — Discharge Summary (Signed)
Physician Discharge Summary  Diamond [name] WVP:[id number] DOB: Jul 05, 1996 DOA: 01/23/2021  PCP: [name], PA-C  Admit date: 01/23/2021  Discharge date: 01/27/2021  Admitted From:Home  Disposition:  Home  Recommendations for Outpatient Follow-up:  1. Follow up with PCP in 1-2 weeks, obtain free samples of Lantus from Free clinic, patient aware since she cannot tolerate Basaglar 2. Follow-up with endocrinology Dr. Dorris Fetch as scheduled 3. Continue other home medications as prior  Home Health: Yes with PT, RN, aide  Equipment/Devices: None  Discharge Condition:Stable  CODE STATUS: Full  Diet recommendation: Heart Healthy/carb modified  Brief/Interim Summary: As per H&P written by Dr. Denton Brick on 01/23/2021 Diamond Quin Wilsonis a 25 y.o.femalewith medical history significant fordiabetes mellitus,, Crohn's disease, hypertension, bipolar disorder. Patient presented to the ED with complaints of chronic difficulty breathing, abdominal swelling, leg swelling extending upwards to her groin-all these have been ongoing for several months.She reports compliance with her Lasix 100 mg daily. No chest pain. She reports compliance with her Lantus 44units daily, and sliding scale insulin. She believes the Lantus is making her have swelling in the legs and a rash,but she reports she has not stopped taking it last dose was last night.  ED Course:Temperature 98.5. Heart rate 112, respiratory 22, blood pressure systolic 546E to 703J. O2 sat 100% on room air. Blood glucose 781. Sodium 125. Serum bicarb 27, anion gap of 7. Serum albumin 2.4. EKG shows sinus tachycardia. Rl 125cc/hr.Insulin drip started. Hospitalist to admit.  -Patient was admitted with type I DKA.  She was noncompliant with her home Basaglar insulin because she thought that this was causing her some swelling and a rash.  Additionally she was injecting her mealtime insulin over some scars and hardened areas of the skin  it was thought that she was having poor absorption as a result.  She was treated for her DKA with insulin drip as well as IV fluid and this resolved fairly quickly.  She was transitioned to a diet which was well-tolerated.  She was noted to have some volume overload secondary to her treatment and was also diuresed with Lasix with adequate improvement noted.  She appears to be otherwise compliant with her home medications aside from her long-acting insulin.  Plan is to prescribe Lantus pens which will be given to her at the free clinic and she has been instructed to avoid scars and hardened tissue on her skin for injection sites.  She is otherwise in stable condition for discharge today with no other acute events noted throughout the course of this admission.  Discharge Diagnoses:  Principal Problem:   Uncontrolled type 1 diabetes mellitus with hyperglycemia (HCC) Active Problems:   History of Crohn's disease   Bilateral lower extremity edema   Anasarca   Hypoalbuminemia   Essential hypertension, benign  Principal discharge diagnosis: DKA type I in the setting of uncontrolled type 1 diabetes with poor medication compliance.  Discharge Instructions  Discharge Instructions    Diet - low sodium heart healthy   Complete by: As directed    Increase activity slowly   Complete by: As directed      Allergies as of 01/27/2021      Reactions   Amoxicillin Hives   Did it involve swelling of the face/tongue/throat, SOB, or low BP? no  Did it involve sudden or severe rash/hives, skin peeling, or any reaction on the inside of your mouth or nose? no Did you need to seek medical attention at a hospital or doctor's office? no  When did it last happen?unk If all above answers are "NO", may proceed with cephalosporin use.   Dexamethasone Hives, Itching   Doxycycline Itching, Swelling   Penicillins Hives, Itching   Did it involve swelling of the face/tongue/throat, SOB, or low BP? no  Did it  involve sudden or severe rash/hives, skin peeling, or any reaction on the inside of your mouth or nose? no Did you need to seek medical attention at a hospital or doctor's office? no When did it last happen?unk If all above answers are "NO", may proceed with cephalosporin use. Did it involve swelling of the face/tongue/throat, SOB, or low BP? no  Did it involve sudden or severe rash/hives, skin peeling, or any reaction on the inside of your mouth or nose? no Did you need to seek medical attention at a hospital or doctor's office? no When did it last happen?unk If all above answers are "NO", may proceed with cephalosporin use. Tolerated keflex May 2021 with no issue.   Augmentin [amoxicillin-pot Clavulanate] Other (See Comments)   Reaction unknown   Ceftriaxone Sodium In Dextrose    Clavulanic Acid    Adhesive [tape] Itching, Rash   Latex Itching, Rash   Other Itching, Rash   Pineapple Rash   Rash on tongue and throat      Medication List    TAKE these medications   albuterol 108 (90 Base) MCG/ACT inhaler Commonly known as: VENTOLIN HFA Inhale 2 puffs into the lungs every 6 (six) hours as needed. For shortness of breath What changed: reasons to take this   alfuzosin 10 MG 24 hr tablet Commonly known as: UROXATRAL Take 1 tablet (10 mg total) by mouth daily with breakfast.   citalopram 10 MG tablet Commonly known as: CELEXA Take 10 mg by mouth daily.   furosemide 20 MG tablet Commonly known as: LASIX Take 100 mg by mouth daily.   gabapentin 100 MG capsule Commonly known as: NEURONTIN Take 1 capsule (100 mg total) by mouth daily.   insulin glargine 100 UNIT/ML injection Commonly known as: LANTUS Inject 0.35 mLs (35 Units total) into the skin daily.   insulin lispro 100 UNIT/ML KwikPen Commonly known as: HumaLOG KwikPen Inject 10-16 Units into the skin 3 (three) times daily before meals. What changed: additional instructions   lisinopril 20 MG  tablet Commonly known as: ZESTRIL Take 1 tablet (20 mg total) by mouth daily.   ondansetron 4 MG tablet Commonly known as: ZOFRAN Take 1 tablet (4 mg total) by mouth in the morning, at noon, in the evening, and at bedtime. What changed:   when to take this  reasons to take this   Pen Needles 31G X 5 MM Misc To use with lantus.   potassium chloride 10 MEQ tablet Commonly known as: KLOR-CON Take 1 tablet (10 mEq total) by mouth daily. Take While taking Lasix/furosemide   promethazine 25 MG tablet Commonly known as: PHENERGAN Take 1 tablet (25 mg total) by mouth every 8 (eight) hours as needed for nausea or vomiting. What changed: when to take this   tamsulosin 0.4 MG Caps capsule Commonly known as: FLOMAX Take 1 capsule (0.4 mg total) by mouth daily.   traZODone 50 MG tablet Commonly known as: DESYREL Take 50 mg by mouth at bedtime.       Follow-up Information    [name], PA-C Follow up in 1 week(s).   Specialty: Physician Radio producer information: [address] [place] [address] [phone]  Allergies  Allergen Reactions  . Amoxicillin Hives    Did it involve swelling of the face/tongue/throat, SOB, or low BP? no  Did it involve sudden or severe rash/hives, skin peeling, or any reaction on the inside of your mouth or nose? no Did you need to seek medical attention at a hospital or doctor's office? no When did it last happen?unk If all above answers are "NO", may proceed with cephalosporin use.  Marland Kitchen Dexamethasone Hives and Itching  . Doxycycline Itching and Swelling  . Penicillins Hives and Itching    Did it involve swelling of the face/tongue/throat, SOB, or low BP? no  Did it involve sudden or severe rash/hives, skin peeling, or any reaction on the inside of your mouth or nose? no Did you need to seek medical attention at a hospital or doctor's office? no When did it last happen?unk If all above answers are  "NO", may proceed with cephalosporin use. Did it involve swelling of the face/tongue/throat, SOB, or low BP? no  Did it involve sudden or severe rash/hives, skin peeling, or any reaction on the inside of your mouth or nose? no Did you need to seek medical attention at a hospital or doctor's office? no When did it last happen?unk If all above answers are "NO", may proceed with cephalosporin use. Tolerated keflex May 2021 with no issue.   . Augmentin [Amoxicillin-Pot Clavulanate] Other (See Comments)    Reaction unknown  . Ceftriaxone Sodium In Dextrose   . Clavulanic Acid   . Adhesive [Tape] Itching and Rash  . Latex Itching and Rash  . Other Itching and Rash  . Pineapple Rash    Rash on tongue and throat    Consultations:  None   Procedures/Studies: US Venous Img Lower Bilateral  Result Date: 01/24/2021 CLINICAL DATA:  Shortness of breath. Bilateral lower extremity swelling. EXAM: BILATERAL LOWER EXTREMITY VENOUS DOPPLER ULTRASOUND TECHNIQUE: Gray-scale sonography with compression, as well as color and duplex ultrasound, were performed to evaluate the deep venous system(s) from the level of the common femoral vein through the popliteal and proximal calf veins. COMPARISON:  No prior FINDINGS: VENOUS Normal compressibility of the common femoral, superficial femoral, and popliteal veins, as well as the visualized calf veins. Visualized portions of profunda femoral vein and great saphenous vein unremarkable. No filling defects to suggest DVT on grayscale or color Doppler imaging. Doppler waveforms show normal direction of venous flow, normal respiratory plasticity and response to augmentation. Limited views of the contralateral common femoral vein are unremarkable. OTHER Prominent bilateral inguinal lymph nodes noted. The largest is in right groin and measures 2.9 cm. Bilateral lower extremity edema noted. IMPRESSION: 1.  No evidence of DVT. 2. Prominent bilateral inguinal lymph nodes  noted. The largest is in the right groin and measures 2.9 cm. Bilateral lower extremity edema noted. Electronically Signed   By: Marcello Moores  Register   On: 01/24/2021 10:35   DG Chest Port 1 View  Result Date: 01/23/2021 CLINICAL DATA:  Shortness of breath with lower extremity edema EXAM: PORTABLE CHEST 1 VIEW COMPARISON:  Chest radiograph January 17, 2021; CT angiogram chest January 17, 2021 FINDINGS: Lungs are clear. Heart size and pulmonary vascularity are normal. No adenopathy. No pneumothorax. No bone lesions. IMPRESSION: Lungs clear.  Cardiac silhouette normal. Electronically Signed   By: Lowella Grip III M.D.   On: 01/23/2021 19:26      Discharge Exam: Vitals:   01/27/21 0544 01/27/21 0924  BP: 117/87 115/72  Pulse: (!) 111 (!) 105  Resp: 20 20  Temp: 98.9 F (37.2 C) 98.3 F (36.8 C)  SpO2: 98% 100%   Vitals:   01/27/21 0122 01/27/21 0500 01/27/21 0544 01/27/21 0924  BP: 111/77  117/87 115/72  Pulse: (!) 105  (!) 111 (!) 105  Resp: 18  20 20   Temp: 98.2 F (36.8 C)  98.9 F (37.2 C) 98.3 F (36.8 C)  TempSrc: Oral  Oral Oral  SpO2: 97%  98% 100%  Weight:  63.3 kg    Height:        General: Pt is alert, awake, not in acute distress Cardiovascular: RRR, S1/S2 +, no rubs, no gallops Respiratory: CTA bilaterally, no wheezing, no rhonchi Abdominal: Soft, NT, ND, bowel sounds + Extremities: Minimal bilateral edema, no cyanosis    The results of significant diagnostics from this hospitalization (including imaging, microbiology, ancillary and laboratory) are listed below for reference.     Microbiology: Recent Results (from the past 240 hour(s))  Resp Panel by RT-PCR (Flu A&B, Covid) Nasopharyngeal Swab     Status: None   Collection Time: 01/23/21  7:26 PM   Specimen: Nasopharyngeal Swab; Nasopharyngeal(NP) swabs in vial transport medium  Result Value Ref Range Status   SARS Coronavirus 2 by RT PCR NEGATIVE NEGATIVE Final    Comment: (NOTE) SARS-CoV-2 target  nucleic acids are NOT DETECTED.  The SARS-CoV-2 RNA is generally detectable in upper respiratory specimens during the acute phase of infection. The lowest concentration of SARS-CoV-2 viral copies this assay can detect is 138 copies/mL. A negative result does not preclude SARS-Cov-2 infection and should not be used as the sole basis for treatment or other patient management decisions. A negative result may occur with  improper specimen collection/handling, submission of specimen other than nasopharyngeal swab, presence of viral mutation(s) within the areas targeted by this assay, and inadequate number of viral copies(<138 copies/mL). A negative result must be combined with clinical observations, patient history, and epidemiological information. The expected result is Negative.  Fact Sheet for Patients:  EntrepreneurPulse.com.au  Fact Sheet for Healthcare Providers:  IncredibleEmployment.be  This test is no t yet approved or cleared by the [place] FDA and  has been authorized for detection and/or diagnosis of SARS-CoV-2 by FDA under an Emergency Use Authorization (EUA). This EUA will remain  in effect (meaning this test can be used) for the duration of the COVID-19 declaration under Section 564(b)(1) of the Act, 21 U.S.C.section 360bbb-3(b)(1), unless the authorization is terminated  or revoked sooner.       Influenza A by PCR NEGATIVE NEGATIVE Final   Influenza B by PCR NEGATIVE NEGATIVE Final    Comment: (NOTE) The Xpert Xpress SARS-CoV-2/FLU/RSV plus assay is intended as an aid in the diagnosis of influenza from Nasopharyngeal swab specimens and should not be used as a sole basis for treatment. Nasal washings and aspirates are unacceptable for Xpert Xpress SARS-CoV-2/FLU/RSV testing.  Fact Sheet for Patients: EntrepreneurPulse.com.au  Fact Sheet for Healthcare  Providers: IncredibleEmployment.be  This test is not yet approved or cleared by the [place] FDA and has been authorized for detection and/or diagnosis of SARS-CoV-2 by FDA under an Emergency Use Authorization (EUA). This EUA will remain in effect (meaning this test can be used) for the duration of the COVID-19 declaration under Section 564(b)(1) of the Act, 21 U.S.C. section 360bbb-3(b)(1), unless the authorization is terminated or revoked.  Performed at [facility], [address] [place], [place] [address]   MRSA PCR Screening     Status: None  Collection Time: 01/23/21 10:49 PM   Specimen: Nasal Mucosa; Nasopharyngeal  Result Value Ref Range Status   MRSA by PCR NEGATIVE NEGATIVE Final    Comment:        The GeneXpert MRSA Assay (FDA approved for NASAL specimens only), is one component of a comprehensive MRSA colonization surveillance program. It is not intended to diagnose MRSA infection nor to guide or monitor treatment for MRSA infections. Performed at [facility], [address] [place], [place] [address]      Labs: BNP (last 3 results) Recent Labs    10/18/20 1212 11/30/20 0936 01/23/21 1806  BNP 83.0 50.0 93.5   Basic Metabolic Panel: Recent Labs  Lab 01/23/21 2349 01/24/21 0420 01/25/21 0834 01/26/21 0420 01/27/21 0536  NA 130* 135 138 137 132*  K 3.6 3.7 3.8 4.5 4.2  CL 99 100 100 102 98  CO2 25 29 29 27 29   GLUCOSE 219* 141* 317* 196* 257*  BUN 8 8 12 14 19   CREATININE 0.68 0.54 0.79 0.71 0.88  CALCIUM 8.3* 8.6* 8.6* 8.4* 8.3*  MG  --   --  1.8 1.9 1.8   Liver Function Tests: Recent Labs  Lab 01/23/21 1806  AST 16  ALT 20  ALKPHOS 106  BILITOT 0.8  PROT 7.1  ALBUMIN 2.4*   Recent Labs  Lab 01/23/21 1806  LIPASE 27   No results for input(s): AMMONIA in the last 168 hours. CBC: Recent Labs  Lab 01/23/21 1806 01/26/21 0420  WBC 6.9 6.7  NEUTROABS 4.3  --   HGB 12.3 11.1*  HCT 37.0 33.8*  MCV 88.3  90.4  PLT 383 335   Cardiac Enzymes: No results for input(s): CKTOTAL, CKMB, CKMBINDEX, TROPONINI in the last 168 hours. BNP: Invalid input(s): POCBNP CBG: Recent Labs  Lab 01/26/21 1107 01/26/21 1526 01/26/21 2215 01/27/21 0003 01/27/21 0742  GLUCAP 197* 164* 397* 368* 209*   D-Dimer No results for input(s): DDIMER in the last 72 hours. Hgb A1c No results for input(s): HGBA1C in the last 72 hours. Lipid Profile No results for input(s): CHOL, HDL, LDLCALC, TRIG, CHOLHDL, LDLDIRECT in the last 72 hours. Thyroid function studies No results for input(s): TSH, T4TOTAL, T3FREE, THYROIDAB in the last 72 hours.  Invalid input(s): FREET3 Anemia work up No results for input(s): VITAMINB12, FOLATE, FERRITIN, TIBC, IRON, RETICCTPCT in the last 72 hours. Urinalysis    Component Value Date/Time   COLORURINE COLORLESS (A) 01/23/2021 2115   APPEARANCEUR CLEAR 01/23/2021 2115   APPEARANCEUR Clear 11/15/2020 1531   LABSPEC 1.020 01/23/2021 2115   PHURINE 7.0 01/23/2021 2115   GLUCOSEU >=500 (A) 01/23/2021 2115   HGBUR SMALL (A) 01/23/2021 2115   BILIRUBINUR NEGATIVE 01/23/2021 2115   BILIRUBINUR NEG 12/05/2020 1353   BILIRUBINUR Negative 11/15/2020 [place] 01/23/2021 2115   PROTEINUR 100 (A) 01/23/2021 2115   UROBILINOGEN 0.2 12/05/2020 1353   UROBILINOGEN 0.2 09/13/2012 2014   NITRITE NEGATIVE 01/23/2021 2115   LEUKOCYTESUR TRACE (A) 01/23/2021 2115   Sepsis Labs Invalid input(s): PROCALCITONIN,  WBC,  LACTICIDVEN Microbiology Recent Results (from the past 240 hour(s))  Resp Panel by RT-PCR (Flu A&B, Covid) Nasopharyngeal Swab     Status: None   Collection Time: 01/23/21  7:26 PM   Specimen: Nasopharyngeal Swab; Nasopharyngeal(NP) swabs in vial transport medium  Result Value Ref Range Status   SARS Coronavirus 2 by RT PCR NEGATIVE NEGATIVE Final    Comment: (NOTE) SARS-CoV-2 target nucleic acids are NOT DETECTED.  The  SARS-CoV-2 RNA is generally  detectable in upper respiratory specimens during the acute phase of infection. The lowest concentration of SARS-CoV-2 viral copies this assay can detect is 138 copies/mL. A negative result does not preclude SARS-Cov-2 infection and should not be used as the sole basis for treatment or other patient management decisions. A negative result may occur with  improper specimen collection/handling, submission of specimen other than nasopharyngeal swab, presence of viral mutation(s) within the areas targeted by this assay, and inadequate number of viral copies(<138 copies/mL). A negative result must be combined with clinical observations, patient history, and epidemiological information. The expected result is Negative.  Fact Sheet for Patients:  EntrepreneurPulse.com.au  Fact Sheet for Healthcare Providers:  IncredibleEmployment.be  This test is no t yet approved or cleared by the [place] FDA and  has been authorized for detection and/or diagnosis of SARS-CoV-2 by FDA under an Emergency Use Authorization (EUA). This EUA will remain  in effect (meaning this test can be used) for the duration of the COVID-19 declaration under Section 564(b)(1) of the Act, 21 U.S.C.section 360bbb-3(b)(1), unless the authorization is terminated  or revoked sooner.       Influenza A by PCR NEGATIVE NEGATIVE Final   Influenza B by PCR NEGATIVE NEGATIVE Final    Comment: (NOTE) The Xpert Xpress SARS-CoV-2/FLU/RSV plus assay is intended as an aid in the diagnosis of influenza from Nasopharyngeal swab specimens and should not be used as a sole basis for treatment. Nasal washings and aspirates are unacceptable for Xpert Xpress SARS-CoV-2/FLU/RSV testing.  Fact Sheet for Patients: EntrepreneurPulse.com.au  Fact Sheet for Healthcare Providers: IncredibleEmployment.be  This test is not yet approved or cleared by the [place] FDA  and has been authorized for detection and/or diagnosis of SARS-CoV-2 by FDA under an Emergency Use Authorization (EUA). This EUA will remain in effect (meaning this test can be used) for the duration of the COVID-19 declaration under Section 564(b)(1) of the Act, 21 U.S.C. section 360bbb-3(b)(1), unless the authorization is terminated or revoked.  Performed at [facility], [address], [place], [place] [address]   MRSA PCR Screening     Status: None   Collection Time: 01/23/21 10:49 PM   Specimen: Nasal Mucosa; Nasopharyngeal  Result Value Ref Range Status   MRSA by PCR NEGATIVE NEGATIVE Final    Comment:        The GeneXpert MRSA Assay (FDA approved for NASAL specimens only), is one component of a comprehensive MRSA colonization surveillance program. It is not intended to diagnose MRSA infection nor to guide or monitor treatment for MRSA infections. Performed at [facility], [address], [place], [place] [address]      Time coordinating discharge: 35 minutes  SIGNED:   Rodena Goldmann, DO [facility] 01/27/2021, 9:25 AM  If 7PM-7AM, please contact night-coverage [email or web address]

## 2021-01-27 NOTE — Telephone Encounter (Signed)
-----   Message from Encarnacion Chu, RN sent at 01/27/2021 11:19 AM EST ----- Regarding: inpatient Hey [name]! I just wanted to let you know that Diamond [name] is currently an inpatient in room 311. Se is scheduled for Dr Elisha Ponder on 01/31/2021.

## 2021-01-30 ENCOUNTER — Encounter (HOSPITAL_COMMUNITY): Payer: Self-pay

## 2021-01-30 ENCOUNTER — Encounter (HOSPITAL_COMMUNITY)
Admission: RE | Admit: 2021-01-30 | Discharge: 2021-01-30 | Disposition: A | Discharge status: Home / Self Care | Payer: Self-pay | Source: Ambulatory Visit | Attending: Internal Medicine | Admitting: Internal Medicine

## 2021-01-30 ENCOUNTER — Other Ambulatory Visit: Payer: Self-pay

## 2021-01-30 ENCOUNTER — Inpatient Hospital Stay (HOSPITAL_COMMUNITY)
Admission: EM | Admit: 2021-01-30 | Discharge: 2021-02-03 | DRG: 638 | Disposition: A | Discharge status: Home / Self Care | Payer: Self-pay | Attending: Internal Medicine | Admitting: Internal Medicine

## 2021-01-30 ENCOUNTER — Other Ambulatory Visit (HOSPITAL_COMMUNITY)
Admission: RE | Admit: 2021-01-30 | Discharge: 2021-01-30 | Disposition: A | Discharge status: Home / Self Care | Payer: Self-pay | Source: Ambulatory Visit | Attending: Internal Medicine | Admitting: Internal Medicine

## 2021-01-30 ENCOUNTER — Emergency Department (HOSPITAL_COMMUNITY): Payer: Self-pay

## 2021-01-30 DIAGNOSIS — E1065 Type 1 diabetes mellitus with hyperglycemia: Principal | ICD-10-CM | POA: Diagnosis present

## 2021-01-30 DIAGNOSIS — E1100 Type 2 diabetes mellitus with hyperosmolarity without nonketotic hyperglycemic-hyperosmolar coma (NKHHC): Secondary | ICD-10-CM | POA: Diagnosis present

## 2021-01-30 DIAGNOSIS — K861 Other chronic pancreatitis: Secondary | ICD-10-CM

## 2021-01-30 DIAGNOSIS — R809 Proteinuria, unspecified: Secondary | ICD-10-CM | POA: Diagnosis present

## 2021-01-30 DIAGNOSIS — Z20822 Contact with and (suspected) exposure to covid-19: Secondary | ICD-10-CM | POA: Diagnosis present

## 2021-01-30 DIAGNOSIS — E1043 Type 1 diabetes mellitus with diabetic autonomic (poly)neuropathy: Secondary | ICD-10-CM | POA: Diagnosis present

## 2021-01-30 DIAGNOSIS — Z8673 Personal history of transient ischemic attack (TIA), and cerebral infarction without residual deficits: Secondary | ICD-10-CM

## 2021-01-30 DIAGNOSIS — R1013 Epigastric pain: Secondary | ICD-10-CM

## 2021-01-30 DIAGNOSIS — Z91040 Latex allergy status: Secondary | ICD-10-CM

## 2021-01-30 DIAGNOSIS — F4310 Post-traumatic stress disorder, unspecified: Secondary | ICD-10-CM | POA: Diagnosis present

## 2021-01-30 DIAGNOSIS — L98499 Non-pressure chronic ulcer of skin of other sites with unspecified severity: Secondary | ICD-10-CM | POA: Diagnosis present

## 2021-01-30 DIAGNOSIS — K5080 Crohn's disease of both small and large intestine without complications: Secondary | ICD-10-CM | POA: Diagnosis present

## 2021-01-30 DIAGNOSIS — Z91018 Allergy to other foods: Secondary | ICD-10-CM

## 2021-01-30 DIAGNOSIS — F909 Attention-deficit hyperactivity disorder, unspecified type: Secondary | ICD-10-CM | POA: Diagnosis present

## 2021-01-30 DIAGNOSIS — K5900 Constipation, unspecified: Secondary | ICD-10-CM

## 2021-01-30 DIAGNOSIS — D649 Anemia, unspecified: Secondary | ICD-10-CM | POA: Diagnosis present

## 2021-01-30 DIAGNOSIS — K2100 Gastro-esophageal reflux disease with esophagitis, without bleeding: Secondary | ICD-10-CM | POA: Diagnosis present

## 2021-01-30 DIAGNOSIS — K648 Other hemorrhoids: Secondary | ICD-10-CM | POA: Diagnosis present

## 2021-01-30 DIAGNOSIS — E1042 Type 1 diabetes mellitus with diabetic polyneuropathy: Secondary | ICD-10-CM | POA: Diagnosis present

## 2021-01-30 DIAGNOSIS — K529 Noninfective gastroenteritis and colitis, unspecified: Secondary | ICD-10-CM | POA: Diagnosis present

## 2021-01-30 DIAGNOSIS — K3184 Gastroparesis: Secondary | ICD-10-CM

## 2021-01-30 DIAGNOSIS — I10 Essential (primary) hypertension: Secondary | ICD-10-CM | POA: Diagnosis present

## 2021-01-30 DIAGNOSIS — K50018 Crohn's disease of small intestine with other complication: Secondary | ICD-10-CM

## 2021-01-30 DIAGNOSIS — R601 Generalized edema: Secondary | ICD-10-CM | POA: Diagnosis present

## 2021-01-30 DIAGNOSIS — N320 Bladder-neck obstruction: Secondary | ICD-10-CM

## 2021-01-30 DIAGNOSIS — R933 Abnormal findings on diagnostic imaging of other parts of digestive tract: Secondary | ICD-10-CM

## 2021-01-30 DIAGNOSIS — Z888 Allergy status to other drugs, medicaments and biological substances status: Secondary | ICD-10-CM

## 2021-01-30 DIAGNOSIS — K5000 Crohn's disease of small intestine without complications: Secondary | ICD-10-CM | POA: Insufficient documentation

## 2021-01-30 DIAGNOSIS — E869 Volume depletion, unspecified: Secondary | ICD-10-CM | POA: Diagnosis present

## 2021-01-30 DIAGNOSIS — Z880 Allergy status to penicillin: Secondary | ICD-10-CM

## 2021-01-30 DIAGNOSIS — E8809 Other disorders of plasma-protein metabolism, not elsewhere classified: Secondary | ICD-10-CM | POA: Diagnosis present

## 2021-01-30 DIAGNOSIS — R600 Localized edema: Secondary | ICD-10-CM

## 2021-01-30 DIAGNOSIS — Z794 Long term (current) use of insulin: Secondary | ICD-10-CM

## 2021-01-30 DIAGNOSIS — K5090 Crohn's disease, unspecified, without complications: Secondary | ICD-10-CM | POA: Diagnosis present

## 2021-01-30 DIAGNOSIS — Z8249 Family history of ischemic heart disease and other diseases of the circulatory system: Secondary | ICD-10-CM

## 2021-01-30 DIAGNOSIS — E10649 Type 1 diabetes mellitus with hypoglycemia without coma: Secondary | ICD-10-CM | POA: Diagnosis present

## 2021-01-30 DIAGNOSIS — R140 Abdominal distension (gaseous): Secondary | ICD-10-CM

## 2021-01-30 DIAGNOSIS — R509 Fever, unspecified: Secondary | ICD-10-CM

## 2021-01-30 DIAGNOSIS — R0789 Other chest pain: Secondary | ICD-10-CM | POA: Diagnosis present

## 2021-01-30 DIAGNOSIS — E871 Hypo-osmolality and hyponatremia: Secondary | ICD-10-CM | POA: Diagnosis present

## 2021-01-30 DIAGNOSIS — Z833 Family history of diabetes mellitus: Secondary | ICD-10-CM

## 2021-01-30 DIAGNOSIS — Z01812 Encounter for preprocedural laboratory examination: Secondary | ICD-10-CM | POA: Insufficient documentation

## 2021-01-30 DIAGNOSIS — E10622 Type 1 diabetes mellitus with other skin ulcer: Secondary | ICD-10-CM | POA: Diagnosis present

## 2021-01-30 DIAGNOSIS — Z79899 Other long term (current) drug therapy: Secondary | ICD-10-CM

## 2021-01-30 DIAGNOSIS — F319 Bipolar disorder, unspecified: Secondary | ICD-10-CM | POA: Diagnosis present

## 2021-01-30 DIAGNOSIS — N289 Disorder of kidney and ureter, unspecified: Secondary | ICD-10-CM | POA: Diagnosis present

## 2021-01-30 DIAGNOSIS — E1165 Type 2 diabetes mellitus with hyperglycemia: Secondary | ICD-10-CM

## 2021-01-30 DIAGNOSIS — R739 Hyperglycemia, unspecified: Secondary | ICD-10-CM

## 2021-01-30 LAB — URINALYSIS, ROUTINE W REFLEX MICROSCOPIC
Bacteria, UA: NONE SEEN
Bilirubin Urine: NEGATIVE
Glucose, UA: >=500 mg/dL — AB
Ketones, ur: NEGATIVE mg/dL
Leukocytes,Ua: NEGATIVE
Nitrite: NEGATIVE
Protein, ur: 100 mg/dL — AB
Specific Gravity, Urine: 1.008 (ref 1.005–1.030)
pH: 6 (ref 5.0–8.0)

## 2021-01-30 LAB — BLOOD GAS, VENOUS
Acid-Base Excess: 0.4 mmol/L (ref 0.0–2.0)
Bicarbonate: 23.7 mmol/L (ref 20.0–28.0)
FIO2: 21
O2 Saturation: 62.5 %
Patient temperature: 37
pCO2, Ven: 47.4 mmHg (ref 44.0–60.0)
pH, Ven: 7.347 (ref 7.250–7.430)
pO2, Ven: 35.3 mmHg (ref 32.0–45.0)

## 2021-01-30 LAB — BRAIN NATRIURETIC PEPTIDE: B Natriuretic Peptide: 76 pg/mL (ref 0.0–100.0)

## 2021-01-30 LAB — CBG MONITORING, ED
Glucose-Capillary: 476 mg/dL — ABNORMAL HIGH (ref 70–99)
Glucose-Capillary: 584 mg/dL (ref 70–99)
Glucose-Capillary: 389 mg/dL — ABNORMAL HIGH (ref 70–99)
Glucose-Capillary: 455 mg/dL — ABNORMAL HIGH (ref 70–99)
Glucose-Capillary: 294 mg/dL — ABNORMAL HIGH (ref 70–99)
Glucose-Capillary: 183 mg/dL — ABNORMAL HIGH (ref 70–99)
Glucose-Capillary: 146 mg/dL — ABNORMAL HIGH (ref 70–99)

## 2021-01-30 LAB — BASIC METABOLIC PANEL
Anion gap: 13 (ref 5–15)
Anion gap: 10 (ref 5–15)
Anion gap: 11 (ref 5–15)
BUN: 13 mg/dL (ref 6–20)
BUN: 12 mg/dL (ref 6–20)
BUN: 12 mg/dL (ref 6–20)
CO2: 24 mmol/L (ref 22–32)
CO2: 25 mmol/L (ref 22–32)
CO2: 27 mmol/L (ref 22–32)
Calcium: 8.7 mg/dL — ABNORMAL LOW (ref 8.9–10.3)
Calcium: 8.8 mg/dL — ABNORMAL LOW (ref 8.9–10.3)
Calcium: 9 mg/dL (ref 8.9–10.3)
Chloride: 97 mmol/L — ABNORMAL LOW (ref 98–111)
Chloride: 96 mmol/L — ABNORMAL LOW (ref 98–111)
Chloride: 106 mmol/L (ref 98–111)
Creatinine, Ser: 0.78 mg/dL (ref 0.44–1.00)
Creatinine, Ser: 0.8 mg/dL (ref 0.44–1.00)
Creatinine, Ser: 0.7 mg/dL (ref 0.44–1.00)
GFR, Estimated: >60 mL/min (ref >60)
GFR, Estimated: >60 mL/min (ref >60)
GFR, Estimated: >60 mL/min (ref >60)
Glucose, Bld: 710 mg/dL (ref 70–99)
Glucose, Bld: 753 mg/dL (ref 70–99)
Glucose, Bld: 159 mg/dL — ABNORMAL HIGH (ref 70–99)
Potassium: 4.5 mmol/L (ref 3.5–5.1)
Potassium: 4.4 mmol/L (ref 3.5–5.1)
Potassium: 3.7 mmol/L (ref 3.5–5.1)
Sodium: 134 mmol/L — ABNORMAL LOW (ref 135–145)
Sodium: 131 mmol/L — ABNORMAL LOW (ref 135–145)
Sodium: 144 mmol/L (ref 135–145)

## 2021-01-30 LAB — CBC WITH DIFFERENTIAL/PLATELET
Abs Immature Granulocytes: 0.02 10*3/uL (ref 0.00–0.07)
Basophils Absolute: 0 10*3/uL (ref 0.0–0.1)
Basophils Relative: 1 %
Eosinophils Absolute: 0.3 10*3/uL (ref 0.0–0.5)
Eosinophils Relative: 6 %
HCT: 39.5 % (ref 36.0–46.0)
Hemoglobin: 12.7 g/dL (ref 12.0–15.0)
Immature Granulocytes: 0 %
Lymphocytes Relative: 25 %
Lymphs Abs: 1.3 10*3/uL (ref 0.7–4.0)
MCH: 30 pg (ref 26.0–34.0)
MCHC: 32.2 g/dL (ref 30.0–36.0)
MCV: 93.2 fL (ref 80.0–100.0)
Monocytes Absolute: 0.3 10*3/uL (ref 0.1–1.0)
Monocytes Relative: 5 %
Neutro Abs: 3.3 10*3/uL (ref 1.7–7.7)
Neutrophils Relative %: 63 %
Platelets: 386 10*3/uL (ref 150–400)
RBC: 4.24 MIL/uL (ref 3.87–5.11)
RDW: 12.8 % (ref 11.5–15.5)
WBC: 5.3 10*3/uL (ref 4.0–10.5)
nRBC: 0 % (ref 0.0–0.2)

## 2021-01-30 LAB — GLUCOSE, CAPILLARY
Glucose-Capillary: >600 mg/dL (ref 70–99)
Glucose-Capillary: 132 mg/dL — ABNORMAL HIGH (ref 70–99)
Glucose-Capillary: 171 mg/dL — ABNORMAL HIGH (ref 70–99)
Glucose-Capillary: 139 mg/dL — ABNORMAL HIGH (ref 70–99)
Glucose-Capillary: 167 mg/dL — ABNORMAL HIGH (ref 70–99)
Glucose-Capillary: 145 mg/dL — ABNORMAL HIGH (ref 70–99)

## 2021-01-30 LAB — RESP PANEL BY RT-PCR (FLU A&B, COVID) ARPGX2
Influenza A by PCR: NEGATIVE
Influenza B by PCR: NEGATIVE
SARS Coronavirus 2 by RT PCR: NEGATIVE

## 2021-01-30 LAB — TROPONIN I (HIGH SENSITIVITY)
Troponin I (High Sensitivity): 12 ng/L (ref <18)
Troponin I (High Sensitivity): 11 ng/L (ref <18)

## 2021-01-30 LAB — HEPATIC FUNCTION PANEL
ALT: 25 U/L (ref 0–44)
AST: 22 U/L (ref 15–41)
Albumin: 2.1 g/dL — ABNORMAL LOW (ref 3.5–5.0)
Alkaline Phosphatase: 81 U/L (ref 38–126)
Bilirubin, Direct: <0.1 mg/dL (ref 0.0–0.2)
Total Bilirubin: 0.6 mg/dL (ref 0.3–1.2)
Total Protein: 5.8 g/dL — ABNORMAL LOW (ref 6.5–8.1)

## 2021-01-30 LAB — PREGNANCY, URINE: Preg Test, Ur: NEGATIVE

## 2021-01-30 LAB — HCG, QUANTITATIVE, PREGNANCY: hCG, Beta Chain, Quant, S: <1 m[IU]/mL (ref <5)

## 2021-01-30 LAB — BETA-HYDROXYBUTYRIC ACID: Beta-Hydroxybutyric Acid: 0.07 mmol/L (ref 0.05–0.27)

## 2021-01-30 MED ORDER — ONDANSETRON 4 MG PO TBDP
4.0000 mg | ORAL_TABLET | Freq: Three times a day (TID) | ORAL | Status: DC
  Administered 2021-01-30 – 2021-02-03 (×13): 4 mg via ORAL
  Filled 2021-01-30 (×13): qty 1

## 2021-01-30 MED ORDER — DEXTROSE 50 % IV SOLN
0.0000 mL | INTRAVENOUS | Status: DC | PRN
  Administered 2021-02-01 (×2): 50 mL via INTRAVENOUS
  Administered 2021-02-01 (×3): 25 mL via INTRAVENOUS
  Filled 2021-01-30 (×3): qty 50

## 2021-01-30 MED ORDER — CITALOPRAM HYDROBROMIDE 20 MG PO TABS
10.0000 mg | ORAL_TABLET | Freq: Every day | ORAL | Status: DC
  Administered 2021-01-31 – 2021-02-02 (×3): 10 mg via ORAL
  Filled 2021-01-30 (×4): qty 1

## 2021-01-30 MED ORDER — DEXTROSE IN LACTATED RINGERS 5 % IV SOLN: INTRAVENOUS | Status: DC

## 2021-01-30 MED ORDER — INSULIN REGULAR(HUMAN) IN NACL 100-0.9 UT/100ML-% IV SOLN
INTRAVENOUS | Status: DC
  Administered 2021-01-30: 0.6 [IU]/h via INTRAVENOUS

## 2021-01-30 MED ORDER — ACETAMINOPHEN 325 MG PO TABS: 650.0000 mg | ORAL_TABLET | Freq: Four times a day (QID) | ORAL | Status: DC | PRN

## 2021-01-30 MED ORDER — TAMSULOSIN HCL 0.4 MG PO CAPS
0.4000 mg | ORAL_CAPSULE | Freq: Every day | ORAL | Status: DC
  Administered 2021-01-31 – 2021-02-02 (×3): 0.4 mg via ORAL
  Filled 2021-01-30 (×4): qty 1

## 2021-01-30 MED ORDER — ENOXAPARIN SODIUM 40 MG/0.4ML ~~LOC~~ SOLN
40.0000 mg | SUBCUTANEOUS | Status: DC
  Administered 2021-01-30 – 2021-01-31 (×2): 40 mg via SUBCUTANEOUS
  Filled 2021-01-30 (×2): qty 0.4

## 2021-01-30 MED ORDER — LACTATED RINGERS IV SOLN: INTRAVENOUS | Status: DC

## 2021-01-30 MED ORDER — LISINOPRIL 10 MG PO TABS
20.0000 mg | ORAL_TABLET | Freq: Every day | ORAL | Status: DC
Start: 2021-01-30 — End: 2021-01-31
  Administered 2021-01-31: 20 mg via ORAL
  Filled 2021-01-30 (×2): qty 2

## 2021-01-30 MED ORDER — GABAPENTIN 100 MG PO CAPS
100.0000 mg | ORAL_CAPSULE | Freq: Every day | ORAL | Status: DC
  Administered 2021-01-31 – 2021-02-02 (×3): 100 mg via ORAL
  Filled 2021-01-30 (×4): qty 1

## 2021-01-30 MED ORDER — KETOROLAC TROMETHAMINE 15 MG/ML IJ SOLN
15.0000 mg | Freq: Four times a day (QID) | INTRAMUSCULAR | Status: DC | PRN
  Administered 2021-01-31 – 2021-02-03 (×7): 15 mg via INTRAVENOUS
  Filled 2021-01-30 (×7): qty 1

## 2021-01-30 MED ORDER — DEXTROSE 50 % IV SOLN: 0.0000 mL | INTRAVENOUS | Status: DC | PRN

## 2021-01-30 MED ORDER — TRAZODONE HCL 50 MG PO TABS
50.0000 mg | ORAL_TABLET | Freq: Every day | ORAL | Status: DC
  Administered 2021-01-30 – 2021-02-02 (×4): 50 mg via ORAL
  Filled 2021-01-30 (×4): qty 1

## 2021-01-30 MED ORDER — INSULIN ASPART 100 UNIT/ML ~~LOC~~ SOLN
0.0000 [IU] | Freq: Three times a day (TID) | SUBCUTANEOUS | Status: DC
  Administered 2021-01-31: 2 [IU] via SUBCUTANEOUS

## 2021-01-30 MED ORDER — INSULIN REGULAR(HUMAN) IN NACL 100-0.9 UT/100ML-% IV SOLN
INTRAVENOUS | Status: DC
  Administered 2021-01-30: 9.5 [IU]/h via INTRAVENOUS
  Filled 2021-01-30: qty 100

## 2021-01-30 MED ORDER — KETOROLAC TROMETHAMINE 30 MG/ML IJ SOLN
30.0000 mg | Freq: Once | INTRAMUSCULAR | Status: AC
  Administered 2021-01-30: 30 mg via INTRAVENOUS
  Filled 2021-01-30: qty 1

## 2021-01-30 MED ORDER — PANTOPRAZOLE SODIUM 40 MG PO TBEC
40.0000 mg | DELAYED_RELEASE_TABLET | Freq: Every day | ORAL | Status: DC
  Administered 2021-01-30 – 2021-02-03 (×5): 40 mg via ORAL
  Filled 2021-01-30 (×5): qty 1

## 2021-01-30 MED ORDER — LINACLOTIDE 145 MCG PO CAPS
290.0000 ug | ORAL_CAPSULE | Freq: Every day | ORAL | Status: DC
  Administered 2021-01-30 – 2021-01-31 (×2): 290 ug via ORAL
  Filled 2021-01-30 (×2): qty 2

## 2021-01-30 MED ORDER — ALFUZOSIN HCL ER 10 MG PO TB24
10.0000 mg | ORAL_TABLET | Freq: Every day | ORAL | Status: DC
  Administered 2021-01-31 – 2021-02-02 (×3): 10 mg via ORAL
  Filled 2021-01-30 (×7): qty 1

## 2021-01-30 MED ORDER — INSULIN GLARGINE 100 UNIT/ML ~~LOC~~ SOLN
35.0000 [IU] | Freq: Every day | SUBCUTANEOUS | Status: DC
  Administered 2021-01-30: 35 [IU] via SUBCUTANEOUS
  Filled 2021-01-30 (×3): qty 0.35

## 2021-01-30 MED ORDER — INSULIN ASPART 100 UNIT/ML ~~LOC~~ SOLN: 0.0000 [IU] | Freq: Every day | SUBCUTANEOUS | Status: DC

## 2021-01-30 MED ORDER — SODIUM CHLORIDE 0.9 % IV BOLUS
1000.0000 mL | Freq: Once | INTRAVENOUS | Status: AC
  Administered 2021-01-30: 1000 mL via INTRAVENOUS

## 2021-01-30 NOTE — H&P (View-Only) (Signed)
Referring Provider: Dr. Carles Collet  Primary Care Physician:  [name], PA-C Primary Gastroenterologist:  Dr. Abbey Chatters  Date of Admission: 01/30/21 Date of Consultation: 01/30/21  Reason for Consultation:  Consideration for colonoscopy  HPI:  Diamond [name] is a 25 y.o. year old female with multiple medical issues to include Type 1 diabetes, pancreatitis, gastroparesis, prior stroke, peripheral neuropathy, anasarca, lower extremity edema, possible Crohn's disease with colonoscopy in 2011 by Dr. Burr Medico at [facility] with colonic mucosa erythematous and thick at level of cecum and presence of ulcerations. EGD was also completed with antral gastritis. Pathology of cecum, ascending colon, and transverse colon showed focal active colitis. Duodenal biopsies with mildly increased lymphocytes. Antral biopsies without H.pylori. Appears she was prescribed Pentasa after review of notes from 2011. She was also on amitriptyline. At that time, felt to possibly have early IBD, indeterminate colitis.   She was seen as a new patient in Jan 2022 at [facility] with plans for colonoscopy and EGD. During pre-op testing, she was found to have markedly elevated blood glucose. High of 710 this morning. Admitted with hyperosmolar state and started on IV insulin with frequent blood glucose, fluid resuscitation. GI has been consulted as colonoscopy/EGD had been planned for tomorrow.   She states she has taken 2 doses of Miralax thus far today in prep for colonoscopy tomorrow. Upper abdominal discomfort for months. Worse with eating. Barely eating food. Stays nauseated. Taking Zofran before meals. BM rare. No OTC laxatives. Has gone up to a week without BM. No overt GI bleeding. No dysphagia. Takes Advil and Tylenol . Has intermittent chest pain. Feels pressure upper abdomen and radiates up into chest. Will vomit three times a day if not taking medication, especially after eating.   Outside CT abdomen pelvis with contrast  Jan 17, 2021: distended urinary bladder with questionable posterior bladder wall thickening, progressive generalized subcutaneous edema of body wall fat, third-spacing, mild distal esophageal wall thickening with small amount of contrast in distal esophagus, increased size of bilateral inguinal lymph nodes. Moderate stool in colon.    Prior CT without contrast Dec 2021 with mild thickening of TI, non-specific.   Upper abdominal discomfort for months. Worse with eating. Barely eating food. Stays nauseated. Taking Zofran before meals. BM rare. No OTC laxatives. Has gone up to a week without BM. No overt GI bleeding. No dysphagia. Takes Advil and Tylenol . Has intermittent chest pain. Feels pressure upper abdomen and radiates up into chest. Will vomit three times a day if not taking medication, especially after eating.   Past Medical History:  Diagnosis Date  . ADHD (attention deficit hyperactivity disorder) 2007  . Allergy   . Asthma   . Bipolar disorder ([place])   . Crohn's colitis ([place])    Dx Dr. Cherrie Gauze  . Depression    early childhood, trial of seroquel?  . Diabetic neuropathy ([place])   . Edema   . GERD (gastroesophageal reflux disease)   . Hypertension   . Pancreatitis   . Panic attacks   . PTSD (post-traumatic stress disorder)   . Stroke ([place]) 07/2020   R side  . Type 1 diabetes ([place]) 2008    Past Surgical History:  Procedure Laterality Date  . ADENOIDECTOMY    . HERNIA REPAIR    . TONSILLECTOMY      Prior to Admission medications   Medication Sig Start Date End Date Taking? Authorizing Provider  albuterol (VENTOLIN HFA) 108 (90 Base) MCG/ACT inhaler Inhale 2 puffs into  the lungs every 6 (six) hours as needed. For shortness of breath Patient taking differently: Inhale 2 puffs into the lungs every 6 (six) hours as needed for shortness of breath or wheezing. For shortness of breath 10/21/20  Yes Emokpae, Courage, MD  alfuzosin (UROXATRAL) 10 MG 24 hr tablet Take 1  tablet (10 mg total) by mouth daily with breakfast. 11/24/20  Yes McKenzie, Candee Furbish, MD  citalopram (CELEXA) 10 MG tablet Take 10 mg by mouth daily.   Yes [provider]  furosemide (LASIX) 20 MG tablet Take 100 mg by mouth daily.   Yes [provider]  gabapentin (NEURONTIN) 100 MG capsule Take 1 capsule (100 mg total) by mouth daily. 10/13/20  Yes [name], PA-C  insulin lispro (HUMALOG KWIKPEN) 100 UNIT/ML KwikPen Inject 10-16 Units into the skin 3 (three) times daily before meals. Patient taking differently: Inject 10-16 Units into the skin 3 (three) times daily before meals. Per sliding scale 11/01/20  Yes Nida, Marella Chimes, MD  lisinopril (ZESTRIL) 20 MG tablet Take 1 tablet (20 mg total) by mouth daily. 11/01/20  Yes Nida, Marella Chimes, MD  ondansetron (ZOFRAN) 4 MG tablet Take 1 tablet (4 mg total) by mouth in the morning, at noon, in the evening, and at bedtime. Patient taking differently: Take 4 mg by mouth every 8 (eight) hours as needed for vomiting or nausea. 12/13/20  Yes Carver, Elon Alas, DO  potassium chloride (KLOR-CON) 10 MEQ tablet Take 1 tablet (10 mEq total) by mouth daily. Take While taking Lasix/furosemide 10/21/20  Yes Emokpae, Courage, MD  tamsulosin (FLOMAX) 0.4 MG CAPS capsule Take 1 capsule (0.4 mg total) by mouth daily. 12/13/20  Yes McKenzie, Candee Furbish, MD  traZODone (DESYREL) 50 MG tablet Take 50 mg by mouth at bedtime.   Yes [provider]  insulin glargine (LANTUS) 100 UNIT/ML injection Inject 0.35 mLs (35 Units total) into the skin daily. 01/27/21   Manuella Ghazi, Pratik D, DO  Insulin Pen Needle (PEN NEEDLES) 31G X 5 MM MISC To use with lantus. 01/27/21   Manuella Ghazi, Pratik D, DO  promethazine (PHENERGAN) 25 MG tablet Take 1 tablet (25 mg total) by mouth every 8 (eight) hours as needed for nausea or vomiting. Patient not taking: Reported on 01/30/2021 12/05/20   [name], PA-C    Current Facility-Administered Medications   Medication Dose Route Frequency Provider Last Rate Last Admin  . [START ON 01/31/2021] alfuzosin (UROXATRAL) 24 hr tablet 10 mg  10 mg Oral Q breakfast Tat, David, MD      . citalopram (CELEXA) tablet 10 mg  10 mg Oral Daily Tat, David, MD      . dextrose 5 % in lactated ringers infusion   Intravenous Continuous Henderly, Britni A, PA-C 125 mL/hr at 01/30/21 1406 New Bag at 01/30/21 1406  . dextrose 5 % in lactated ringers infusion   Intravenous Continuous Tat, David, MD      . dextrose 50 % solution 0-50 mL  0-50 mL Intravenous PRN Henderly, Britni A, PA-C      . dextrose 50 % solution 0-50 mL  0-50 mL Intravenous PRN Tat, David, MD      . enoxaparin (LOVENOX) injection 40 mg  40 mg Subcutaneous Q24H Tat, Shanon Brow, MD   40 mg at 01/30/21 1502  . gabapentin (NEURONTIN) capsule 100 mg  100 mg Oral Daily Tat, David, MD      . insulin regular, human (MYXREDLIN) 100 units/ 100 mL infusion   Intravenous Continuous Tat,  Shanon Brow, MD      . lactated ringers infusion   Intravenous Continuous Henderly, Britni A, PA-C   Stopped at 01/30/21 1406  . lactated ringers infusion   Intravenous Continuous Tat, David, MD      . lisinopril (ZESTRIL) tablet 20 mg  20 mg Oral Daily Tat, David, MD      . tamsulosin (FLOMAX) capsule 0.4 mg  0.4 mg Oral Daily Tat, David, MD      . traZODone (DESYREL) tablet 50 mg  50 mg Oral Benay Pike, MD       Current Outpatient Medications  Medication Sig Dispense Refill  . albuterol (VENTOLIN HFA) 108 (90 Base) MCG/ACT inhaler Inhale 2 puffs into the lungs every 6 (six) hours as needed. For shortness of breath (Patient taking differently: Inhale 2 puffs into the lungs every 6 (six) hours as needed for shortness of breath or wheezing. For shortness of breath) 18 g 1  . alfuzosin (UROXATRAL) 10 MG 24 hr tablet Take 1 tablet (10 mg total) by mouth daily with breakfast. 30 tablet 1  . citalopram (CELEXA) 10 MG tablet Take 10 mg by mouth daily.    . furosemide (LASIX) 20 MG tablet Take 100  mg by mouth daily.    Marland Kitchen gabapentin (NEURONTIN) 100 MG capsule Take 1 capsule (100 mg total) by mouth daily. 30 capsule 0  . insulin lispro (HUMALOG KWIKPEN) 100 UNIT/ML KwikPen Inject 10-16 Units into the skin 3 (three) times daily before meals. (Patient taking differently: Inject 10-16 Units into the skin 3 (three) times daily before meals. Per sliding scale) 15 mL 2  . lisinopril (ZESTRIL) 20 MG tablet Take 1 tablet (20 mg total) by mouth daily. 90 tablet 1  . ondansetron (ZOFRAN) 4 MG tablet Take 1 tablet (4 mg total) by mouth in the morning, at noon, in the evening, and at bedtime. (Patient taking differently: Take 4 mg by mouth every 8 (eight) hours as needed for vomiting or nausea.) 30 tablet 1  . potassium chloride (KLOR-CON) 10 MEQ tablet Take 1 tablet (10 mEq total) by mouth daily. Take While taking Lasix/furosemide 30 tablet 2  . tamsulosin (FLOMAX) 0.4 MG CAPS capsule Take 1 capsule (0.4 mg total) by mouth daily. 30 capsule 0  . traZODone (DESYREL) 50 MG tablet Take 50 mg by mouth at bedtime.    . insulin glargine (LANTUS) 100 UNIT/ML injection Inject 0.35 mLs (35 Units total) into the skin daily. 10 mL 11  . Insulin Pen Needle (PEN NEEDLES) 31G X 5 MM MISC To use with lantus. 100 each 3  . promethazine (PHENERGAN) 25 MG tablet Take 1 tablet (25 mg total) by mouth every 8 (eight) hours as needed for nausea or vomiting. (Patient not taking: Reported on 01/30/2021) 20 tablet 0    Allergies as of 01/30/2021 - Review Complete 01/30/2021  Allergen Reaction Noted  . Amoxicillin Hives 04/28/2012  . Dexamethasone Hives and Itching 05/04/2020  . Doxycycline Itching and Swelling 04/17/2020  . Penicillins Hives and Itching 04/28/2012  . Augmentin [amoxicillin-pot clavulanate] Other (See Comments) 07/12/2012  . Ceftriaxone sodium in dextrose  02/29/2020  . Clavulanic acid  05/29/2019  . Adhesive [tape] Itching and Rash 07/12/2012  . Latex Itching and Rash 05/15/2012  . Other Itching and Rash  07/12/2012  . Pineapple Rash 06/26/2012    Family History  Problem Relation Age of Onset  . Hypertension Mother   . Diabetes Mother   . Heart disease Mother   . Crohn's disease  Father   . Alcohol abuse Father   . Drug abuse Father   . Mental illness Father   . Cancer Father   . Hypertension Maternal Aunt   . Seizures Maternal Aunt   . Depression Maternal Aunt   . Asthma Maternal Uncle   . Hypertension Maternal Uncle   . Cancer Maternal Grandmother   . Diabetes Maternal Grandmother   . Mental illness Maternal Grandfather     Social History   Socioeconomic History  . Marital status: Single    Spouse name: Not on file  . Number of children: Not on file  . Years of education: Not on file  . Highest education level: Not on file  Occupational History  . Not on file  Tobacco Use  . Smoking status: Never Smoker  . Smokeless tobacco: Never Used  Vaping Use  . Vaping Use: Former  Substance and Sexual Activity  . Alcohol use: No  . Drug use: No  . Sexual activity: Yes  Other Topics Concern  . Not on file  Social History Narrative  . Not on file   Social Determinants of Health   Financial Resource Strain: Not on file  Food Insecurity: Not on file  Transportation Needs: Not on file  Physical Activity: Not on file  Stress: Not on file  Social Connections: Not on file  Intimate Partner Violence: Not on file    Review of Systems: Gen: Denies fever, chills, loss of appetite, change in weight or weight loss CV: Denies chest pain, heart palpitations, syncope, edema  Resp: Denies shortness of breath with rest, cough, wheezing GI: see HPI GU : Denies urinary burning, urinary frequency, urinary incontinence.  MS: Denies joint pain,swelling, cramping Derm: Denies rash, itching, dry skin Psych: Denies depression, anxiety,confusion, or memory loss Heme: Denies bruising, bleeding, and enlarged lymph nodes.  Physical Exam: Vital signs in last 24 hours: Temp:  [98.5 F  (36.9 C)-98.6 F (37 C)] 98.5 F (36.9 C) (02/28 0857) Pulse Rate:  [109-118] 110 (02/28 1250) Resp:  [14-21] 14 (02/28 1250) BP: (107-159)/(63-104) 107/77 (02/28 1250) SpO2:  [94 %-100 %] 99 % (02/28 1250) Weight:  [63.3 kg] 63.3 kg (02/28 0857)   General:   Alert,  Well-developed, well-nourished, pleasant and cooperative in NAD Head:  Normocephalic and atraumatic. Eyes:  Sclera clear, no icterus.   Ears:  Normal auditory acuity. Nose:  No deformity, discharge,  or lesions. Mouth:  No deformity or lesions, dentition normal. Lungs:  Clear throughout to auscultation.    Heart:  S1 S2 present without murmurs Abdomen:  Soft, TTP upper abdomen and nondistended. Round and full. Limited exam as patient on bedside commode.  Rectal:  Deferred until time of colonoscopy.   Msk:  Symmetrical without gross deformities. Normal posture. Extremities:  Lower extremity edema to thigh, 2+, pedal edema, anasarca Neurologic:  Alert and  oriented x4 Psych:  Alert and cooperative. Normal mood and affect.  Intake/Output from previous day: No intake/output data recorded. Intake/Output this shift: Total I/O In: 1300 [I.V.:300; IV Piggyback:1000] Out: -   Lab Results: Recent Labs    01/30/21 1006  WBC 5.3  HGB 12.7  HCT 39.5  PLT 386   BMET Recent Labs    01/30/21 0907 01/30/21 1006  NA 134* 131*  K 4.5 4.4  CL 97* 96*  CO2 24 25  GLUCOSE 710* 753*  BUN 13 12  CREATININE 0.78 0.80  CALCIUM 8.7* 8.8*    Studies/Results: DG Chest Portable 1 View  Result Date: 01/30/2021 CLINICAL DATA:  Lower extremity edema. Chest pain and elevated blood sugar. EXAM: PORTABLE CHEST 1 VIEW COMPARISON:  01/23/2021 FINDINGS: 1017 hours. The lungs are clear without focal pneumonia, edema, pneumothorax or pleural effusion. The cardiopericardial silhouette is within normal limits for size. The visualized bony structures of the thorax show no acute abnormality. IMPRESSION: No active disease. Electronically  Signed   By: Misty Stanley M.D.   On: 01/30/2021 10:28    Impression: 25 year old female with multiple medical issues to include Type 1 diabetes, pancreatitis, gastroparesis, prior stroke, peripheral neuropathy, anasarca, lower extremity edema, possible Crohn's disease with colonoscopy/EGD in 2011 by Dr. Burr Medico at [facility] and prescribed Pentasa; however, she was lost to follow-up and established care with [facility] in January 2022. Plans were for EGD/colonoscopy on 3/1. Now admitted with hyperosmolar state, on insulin drip, with initial blood glucose greater than 700.   Epigastric pain: admits to NSAIDs at times, alternating with Tylenol. Outside CT abdomen pelvis with contrast Jan 17, 2021: distended urinary bladder with questionable posterior bladder wall thickening, progressive generalized subcutaneous edema of body wall fat, third-spacing, mild distal esophageal wall thickening with small amount of contrast in distal esophagus, increased size of bilateral inguinal lymph nodes. Moderate stool in colon.  Will need EGD once stabilized.   Possible Crohn's disease: will need colonoscopy as well. Hopeful to do this during this admission as it may be difficult to achieve as outpatient in setting of multiple health issues. Chronic constipation is a concern as well with prep. I am starting her on Linzess 290 mcg daily now. Stay on clear liquids for now. Plans for colonoscopy/EGD prior to discharge.   Gastroparesis: in setting of uncontrolled Type 1 diabetes. Will start Zofran scheduled as she has noted this is helpful as outpatient. PPI daily.   Anasarca: notable hypoalbuminemia historically. Check albumin.   Plan: PPI daily Zofran TID and at bedtime Linzess 290 mcg daily Clear liquids Will tentatively plan on colonoscopy/EGD prior to discharge but have cancelled tomorrow's procedure  Check HFP Will continue to follow along with you   Annitta Needs, PhD, ANP-BC [facility]      LOS: 0 days    01/30/2021, 3:06 PM

## 2021-01-30 NOTE — ED Notes (Signed)
2+ -3+ pitting edema from waist to feet.  Slightly yellow tone to skin.

## 2021-01-30 NOTE — ED Triage Notes (Signed)
Pt brought to ED from [facility]. Pt was there for pre op today and sugar noted to be high. BMET sent and pending. Pt denies any symptoms. Pt is type 1 DM states sugars have been high at home.

## 2021-01-30 NOTE — ED Notes (Signed)
Dr Tat at bedside 

## 2021-01-30 NOTE — ED Notes (Signed)
Healing ulcer noted to right outer lower leg, (fifty cent size).  Denies any pain.

## 2021-01-30 NOTE — Pre-Procedure Instructions (Signed)
Pt had questions regarding glucose control while doing prep for colonoscopy so during education for that found that she has been hyperglycemic for the last few days. CBG performed and read HI so BMP drawn. Consulted Dr. Charna Elizabeth who requested that she been taken to ED for evaluation.

## 2021-01-30 NOTE — Progress Notes (Addendum)
Inpatient Diabetes Program Recommendations  AACE/ADA: New Consensus Statement on Inpatient Glycemic Control (2015)  Target Ranges:  Prepandial:   less than 140 mg/dL      Peak postprandial:   less than 180 mg/dL (1-2 hours)      Critically ill patients:  140 - 180 mg/dL   Lab Results  Component Value Date   GLUCAP 455 (H) 01/30/2021   HGBA1C 14.7 (A) 12/07/2020    Review of Glycemic Control Results for Diamond [name], Diamond [name] (MRN [id number]) as of 01/30/2021 11:37  Ref. Range 01/30/2021 10:31 01/30/2021 11:04 01/30/2021 11:34  Glucose-Capillary Latest Ref Range: 70 - 99 mg/dL 584 (HH) 476 (H) 455 (H)   Diabetes history: DM 1 Outpatient Diabetes medications:  Lantus 35 units daily, Humalog 10-16 units tid with meals Current orders for Inpatient glycemic control:  IV insulin - Inpatient Diabetes Program Recommendations:    Agree with start of insulin drip.  Unsure why blood sugars are so high? Question of patient took basal insulin yesterday??  Spoke with patient by phone.  She states that she has not taken Lantus or Basaglar since leaving the hospital due to her not having the Lantus yet?  [facility] but had decided to wait until today to pick up Lantus.  Explained that she needs basal insulin to prevent blood sugars from increasing?  It appears patient has been counseled several times regarding insulin, DM management, and prevention of hyperglycemia.  Needs [facility] and needs to pick up medications which was arranged for her last week.   Insulin drip currently being infused.  Once blood sugars <180 mg/dL, consider Lantus 35 units 2 hours prior to d/c of insulin drip.  Need to ensure that patient has medications at d/c and also has plans to pick up medications.    Thanks,  Adah Perl, RN, BC-ADM Inpatient Diabetes Coordinator Pager [phone] (8a-5p)

## 2021-01-30 NOTE — Consult Note (Signed)
Referring Provider: Dr. Carles Collet  Primary Care Physician:  [name], PA-C Primary Gastroenterologist:  Dr. Abbey Chatters  Date of Admission: 01/30/21 Date of Consultation: 01/30/21  Reason for Consultation:  Consideration for colonoscopy  HPI:  Diamond [name] is a 25 y.o. year old female with multiple medical issues to include Type 1 diabetes, pancreatitis, gastroparesis, prior stroke, peripheral neuropathy, anasarca, lower extremity edema, possible Crohn's disease with colonoscopy in 2011 by Dr. Burr Medico at [facility] with colonic mucosa erythematous and thick at level of cecum and presence of ulcerations. EGD was also completed with antral gastritis. Pathology of cecum, ascending colon, and transverse colon showed focal active colitis. Duodenal biopsies with mildly increased lymphocytes. Antral biopsies without H.pylori. Appears she was prescribed Pentasa after review of notes from 2011. She was also on amitriptyline. At that time, felt to possibly have early IBD, indeterminate colitis.   She was seen as a new patient in Jan 2022 at [facility] with plans for colonoscopy and EGD. During pre-op testing, she was found to have markedly elevated blood glucose. High of 710 this morning. Admitted with hyperosmolar state and started on IV insulin with frequent blood glucose, fluid resuscitation. GI has been consulted as colonoscopy/EGD had been planned for tomorrow.   She states she has taken 2 doses of Miralax thus far today in prep for colonoscopy tomorrow. Upper abdominal discomfort for months. Worse with eating. Barely eating food. Stays nauseated. Taking Zofran before meals. BM rare. No OTC laxatives. Has gone up to a week without BM. No overt GI bleeding. No dysphagia. Takes Advil and Tylenol . Has intermittent chest pain. Feels pressure upper abdomen and radiates up into chest. Will vomit three times a day if not taking medication, especially after eating.   Outside CT abdomen pelvis with contrast  Jan 17, 2021: distended urinary bladder with questionable posterior bladder wall thickening, progressive generalized subcutaneous edema of body wall fat, third-spacing, mild distal esophageal wall thickening with small amount of contrast in distal esophagus, increased size of bilateral inguinal lymph nodes. Moderate stool in colon.    Prior CT without contrast Dec 2021 with mild thickening of TI, non-specific.   Upper abdominal discomfort for months. Worse with eating. Barely eating food. Stays nauseated. Taking Zofran before meals. BM rare. No OTC laxatives. Has gone up to a week without BM. No overt GI bleeding. No dysphagia. Takes Advil and Tylenol . Has intermittent chest pain. Feels pressure upper abdomen and radiates up into chest. Will vomit three times a day if not taking medication, especially after eating.   Past Medical History:  Diagnosis Date  . ADHD (attention deficit hyperactivity disorder) 2007  . Allergy   . Asthma   . Bipolar disorder ([place])   . Crohn's colitis ([place])    Dx Dr. Cherrie Gauze  . Depression    early childhood, trial of seroquel?  . Diabetic neuropathy ([place])   . Edema   . GERD (gastroesophageal reflux disease)   . Hypertension   . Pancreatitis   . Panic attacks   . PTSD (post-traumatic stress disorder)   . Stroke ([place]) 07/2020   R side  . Type 1 diabetes ([place]) 2008    Past Surgical History:  Procedure Laterality Date  . ADENOIDECTOMY    . HERNIA REPAIR    . TONSILLECTOMY      Prior to Admission medications   Medication Sig Start Date End Date Taking? Authorizing Provider  albuterol (VENTOLIN HFA) 108 (90 Base) MCG/ACT inhaler Inhale 2 puffs into  the lungs every 6 (six) hours as needed. For shortness of breath Patient taking differently: Inhale 2 puffs into the lungs every 6 (six) hours as needed for shortness of breath or wheezing. For shortness of breath 10/21/20  Yes Emokpae, Courage, MD  alfuzosin (UROXATRAL) 10 MG 24 hr tablet Take 1  tablet (10 mg total) by mouth daily with breakfast. 11/24/20  Yes McKenzie, Candee Furbish, MD  citalopram (CELEXA) 10 MG tablet Take 10 mg by mouth daily.   Yes [provider]  furosemide (LASIX) 20 MG tablet Take 100 mg by mouth daily.   Yes [provider]  gabapentin (NEURONTIN) 100 MG capsule Take 1 capsule (100 mg total) by mouth daily. 10/13/20  Yes [name], PA-C  insulin lispro (HUMALOG KWIKPEN) 100 UNIT/ML KwikPen Inject 10-16 Units into the skin 3 (three) times daily before meals. Patient taking differently: Inject 10-16 Units into the skin 3 (three) times daily before meals. Per sliding scale 11/01/20  Yes Nida, Marella Chimes, MD  lisinopril (ZESTRIL) 20 MG tablet Take 1 tablet (20 mg total) by mouth daily. 11/01/20  Yes Nida, Marella Chimes, MD  ondansetron (ZOFRAN) 4 MG tablet Take 1 tablet (4 mg total) by mouth in the morning, at noon, in the evening, and at bedtime. Patient taking differently: Take 4 mg by mouth every 8 (eight) hours as needed for vomiting or nausea. 12/13/20  Yes Carver, Elon Alas, DO  potassium chloride (KLOR-CON) 10 MEQ tablet Take 1 tablet (10 mEq total) by mouth daily. Take While taking Lasix/furosemide 10/21/20  Yes Emokpae, Courage, MD  tamsulosin (FLOMAX) 0.4 MG CAPS capsule Take 1 capsule (0.4 mg total) by mouth daily. 12/13/20  Yes McKenzie, Candee Furbish, MD  traZODone (DESYREL) 50 MG tablet Take 50 mg by mouth at bedtime.   Yes [provider]  insulin glargine (LANTUS) 100 UNIT/ML injection Inject 0.35 mLs (35 Units total) into the skin daily. 01/27/21   Manuella Ghazi, Pratik D, DO  Insulin Pen Needle (PEN NEEDLES) 31G X 5 MM MISC To use with lantus. 01/27/21   Manuella Ghazi, Pratik D, DO  promethazine (PHENERGAN) 25 MG tablet Take 1 tablet (25 mg total) by mouth every 8 (eight) hours as needed for nausea or vomiting. Patient not taking: Reported on 01/30/2021 12/05/20   [name], PA-C    Current Facility-Administered Medications   Medication Dose Route Frequency Provider Last Rate Last Admin  . [START ON 01/31/2021] alfuzosin (UROXATRAL) 24 hr tablet 10 mg  10 mg Oral Q breakfast Tat, David, MD      . citalopram (CELEXA) tablet 10 mg  10 mg Oral Daily Tat, David, MD      . dextrose 5 % in lactated ringers infusion   Intravenous Continuous Henderly, Britni A, PA-C 125 mL/hr at 01/30/21 1406 New Bag at 01/30/21 1406  . dextrose 5 % in lactated ringers infusion   Intravenous Continuous Tat, David, MD      . dextrose 50 % solution 0-50 mL  0-50 mL Intravenous PRN Henderly, Britni A, PA-C      . dextrose 50 % solution 0-50 mL  0-50 mL Intravenous PRN Tat, David, MD      . enoxaparin (LOVENOX) injection 40 mg  40 mg Subcutaneous Q24H Tat, Shanon Brow, MD   40 mg at 01/30/21 1502  . gabapentin (NEURONTIN) capsule 100 mg  100 mg Oral Daily Tat, David, MD      . insulin regular, human (MYXREDLIN) 100 units/ 100 mL infusion   Intravenous Continuous Tat,  Shanon Brow, MD      . lactated ringers infusion   Intravenous Continuous Henderly, Britni A, PA-C   Stopped at 01/30/21 1406  . lactated ringers infusion   Intravenous Continuous Tat, David, MD      . lisinopril (ZESTRIL) tablet 20 mg  20 mg Oral Daily Tat, David, MD      . tamsulosin (FLOMAX) capsule 0.4 mg  0.4 mg Oral Daily Tat, David, MD      . traZODone (DESYREL) tablet 50 mg  50 mg Oral Benay Pike, MD       Current Outpatient Medications  Medication Sig Dispense Refill  . albuterol (VENTOLIN HFA) 108 (90 Base) MCG/ACT inhaler Inhale 2 puffs into the lungs every 6 (six) hours as needed. For shortness of breath (Patient taking differently: Inhale 2 puffs into the lungs every 6 (six) hours as needed for shortness of breath or wheezing. For shortness of breath) 18 g 1  . alfuzosin (UROXATRAL) 10 MG 24 hr tablet Take 1 tablet (10 mg total) by mouth daily with breakfast. 30 tablet 1  . citalopram (CELEXA) 10 MG tablet Take 10 mg by mouth daily.    . furosemide (LASIX) 20 MG tablet Take 100  mg by mouth daily.    Marland Kitchen gabapentin (NEURONTIN) 100 MG capsule Take 1 capsule (100 mg total) by mouth daily. 30 capsule 0  . insulin lispro (HUMALOG KWIKPEN) 100 UNIT/ML KwikPen Inject 10-16 Units into the skin 3 (three) times daily before meals. (Patient taking differently: Inject 10-16 Units into the skin 3 (three) times daily before meals. Per sliding scale) 15 mL 2  . lisinopril (ZESTRIL) 20 MG tablet Take 1 tablet (20 mg total) by mouth daily. 90 tablet 1  . ondansetron (ZOFRAN) 4 MG tablet Take 1 tablet (4 mg total) by mouth in the morning, at noon, in the evening, and at bedtime. (Patient taking differently: Take 4 mg by mouth every 8 (eight) hours as needed for vomiting or nausea.) 30 tablet 1  . potassium chloride (KLOR-CON) 10 MEQ tablet Take 1 tablet (10 mEq total) by mouth daily. Take While taking Lasix/furosemide 30 tablet 2  . tamsulosin (FLOMAX) 0.4 MG CAPS capsule Take 1 capsule (0.4 mg total) by mouth daily. 30 capsule 0  . traZODone (DESYREL) 50 MG tablet Take 50 mg by mouth at bedtime.    . insulin glargine (LANTUS) 100 UNIT/ML injection Inject 0.35 mLs (35 Units total) into the skin daily. 10 mL 11  . Insulin Pen Needle (PEN NEEDLES) 31G X 5 MM MISC To use with lantus. 100 each 3  . promethazine (PHENERGAN) 25 MG tablet Take 1 tablet (25 mg total) by mouth every 8 (eight) hours as needed for nausea or vomiting. (Patient not taking: Reported on 01/30/2021) 20 tablet 0    Allergies as of 01/30/2021 - Review Complete 01/30/2021  Allergen Reaction Noted  . Amoxicillin Hives 04/28/2012  . Dexamethasone Hives and Itching 05/04/2020  . Doxycycline Itching and Swelling 04/17/2020  . Penicillins Hives and Itching 04/28/2012  . Augmentin [amoxicillin-pot clavulanate] Other (See Comments) 07/12/2012  . Ceftriaxone sodium in dextrose  02/29/2020  . Clavulanic acid  05/29/2019  . Adhesive [tape] Itching and Rash 07/12/2012  . Latex Itching and Rash 05/15/2012  . Other Itching and Rash  07/12/2012  . Pineapple Rash 06/26/2012    Family History  Problem Relation Age of Onset  . Hypertension Mother   . Diabetes Mother   . Heart disease Mother   . Crohn's disease  Father   . Alcohol abuse Father   . Drug abuse Father   . Mental illness Father   . Cancer Father   . Hypertension Maternal Aunt   . Seizures Maternal Aunt   . Depression Maternal Aunt   . Asthma Maternal Uncle   . Hypertension Maternal Uncle   . Cancer Maternal Grandmother   . Diabetes Maternal Grandmother   . Mental illness Maternal Grandfather     Social History   Socioeconomic History  . Marital status: Single    Spouse name: Not on file  . Number of children: Not on file  . Years of education: Not on file  . Highest education level: Not on file  Occupational History  . Not on file  Tobacco Use  . Smoking status: Never Smoker  . Smokeless tobacco: Never Used  Vaping Use  . Vaping Use: Former  Substance and Sexual Activity  . Alcohol use: No  . Drug use: No  . Sexual activity: Yes  Other Topics Concern  . Not on file  Social History Narrative  . Not on file   Social Determinants of Health   Financial Resource Strain: Not on file  Food Insecurity: Not on file  Transportation Needs: Not on file  Physical Activity: Not on file  Stress: Not on file  Social Connections: Not on file  Intimate Partner Violence: Not on file    Review of Systems: Gen: Denies fever, chills, loss of appetite, change in weight or weight loss CV: Denies chest pain, heart palpitations, syncope, edema  Resp: Denies shortness of breath with rest, cough, wheezing GI: see HPI GU : Denies urinary burning, urinary frequency, urinary incontinence.  MS: Denies joint pain,swelling, cramping Derm: Denies rash, itching, dry skin Psych: Denies depression, anxiety,confusion, or memory loss Heme: Denies bruising, bleeding, and enlarged lymph nodes.  Physical Exam: Vital signs in last 24 hours: Temp:  [98.5 F  (36.9 C)-98.6 F (37 C)] 98.5 F (36.9 C) (02/28 0857) Pulse Rate:  [109-118] 110 (02/28 1250) Resp:  [14-21] 14 (02/28 1250) BP: (107-159)/(63-104) 107/77 (02/28 1250) SpO2:  [94 %-100 %] 99 % (02/28 1250) Weight:  [63.3 kg] 63.3 kg (02/28 0857)   General:   Alert,  Well-developed, well-nourished, pleasant and cooperative in NAD Head:  Normocephalic and atraumatic. Eyes:  Sclera clear, no icterus.   Ears:  Normal auditory acuity. Nose:  No deformity, discharge,  or lesions. Mouth:  No deformity or lesions, dentition normal. Lungs:  Clear throughout to auscultation.    Heart:  S1 S2 present without murmurs Abdomen:  Soft, TTP upper abdomen and nondistended. Round and full. Limited exam as patient on bedside commode.  Rectal:  Deferred until time of colonoscopy.   Msk:  Symmetrical without gross deformities. Normal posture. Extremities:  Lower extremity edema to thigh, 2+, pedal edema, anasarca Neurologic:  Alert and  oriented x4 Psych:  Alert and cooperative. Normal mood and affect.  Intake/Output from previous day: No intake/output data recorded. Intake/Output this shift: Total I/O In: 1300 [I.V.:300; IV Piggyback:1000] Out: -   Lab Results: Recent Labs    01/30/21 1006  WBC 5.3  HGB 12.7  HCT 39.5  PLT 386   BMET Recent Labs    01/30/21 0907 01/30/21 1006  NA 134* 131*  K 4.5 4.4  CL 97* 96*  CO2 24 25  GLUCOSE 710* 753*  BUN 13 12  CREATININE 0.78 0.80  CALCIUM 8.7* 8.8*    Studies/Results: DG Chest Portable 1 View  Result Date: 01/30/2021 CLINICAL DATA:  Lower extremity edema. Chest pain and elevated blood sugar. EXAM: PORTABLE CHEST 1 VIEW COMPARISON:  01/23/2021 FINDINGS: 1017 hours. The lungs are clear without focal pneumonia, edema, pneumothorax or pleural effusion. The cardiopericardial silhouette is within normal limits for size. The visualized bony structures of the thorax show no acute abnormality. IMPRESSION: No active disease. Electronically  Signed   By: Misty Stanley M.D.   On: 01/30/2021 10:28    Impression: 25 year old female with multiple medical issues to include Type 1 diabetes, pancreatitis, gastroparesis, prior stroke, peripheral neuropathy, anasarca, lower extremity edema, possible Crohn's disease with colonoscopy/EGD in 2011 by Dr. Burr Medico at [facility] and prescribed Pentasa; however, she was lost to follow-up and established care with [facility] in January 2022. Plans were for EGD/colonoscopy on 3/1. Now admitted with hyperosmolar state, on insulin drip, with initial blood glucose greater than 700.   Epigastric pain: admits to NSAIDs at times, alternating with Tylenol. Outside CT abdomen pelvis with contrast Jan 17, 2021: distended urinary bladder with questionable posterior bladder wall thickening, progressive generalized subcutaneous edema of body wall fat, third-spacing, mild distal esophageal wall thickening with small amount of contrast in distal esophagus, increased size of bilateral inguinal lymph nodes. Moderate stool in colon.  Will need EGD once stabilized.   Possible Crohn's disease: will need colonoscopy as well. Hopeful to do this during this admission as it may be difficult to achieve as outpatient in setting of multiple health issues. Chronic constipation is a concern as well with prep. I am starting her on Linzess 290 mcg daily now. Stay on clear liquids for now. Plans for colonoscopy/EGD prior to discharge.   Gastroparesis: in setting of uncontrolled Type 1 diabetes. Will start Zofran scheduled as she has noted this is helpful as outpatient. PPI daily.   Anasarca: notable hypoalbuminemia historically. Check albumin.   Plan: PPI daily Zofran TID and at bedtime Linzess 290 mcg daily Clear liquids Will tentatively plan on colonoscopy/EGD prior to discharge but have cancelled tomorrow's procedure  Check HFP Will continue to follow along with you   Annitta Needs, PhD, ANP-BC [facility]      LOS: 0 days    01/30/2021, 3:06 PM

## 2021-01-30 NOTE — Progress Notes (Signed)
Spoke with Dr. Abbey Chatters regarding pt's hyperglycemia. Pt cancelled for now. Scheduler notified.

## 2021-01-30 NOTE — H&P (Signed)
History and Physical  Diamond [name] WFU:[id number] DOB: 1996-05-02 DOA: 01/30/2021   PCP: [name], PA-C   Patient coming from: Home  Chief Complaint: n/v, high sugars  HPI:  Diamond [name] is a 25 y.o. female with medical history of  DM type 1, bipolar disorder, anasarca, Crohn's Disease, and hypertension presenting with high blood sugars.  The patient was recently discharged from the hospital after a stay from 01/23/21 to 01/17/21 for treatment of DKA.  The patient was discharged home with instructions to get Lantus from the free clinic.  Apparently, the patient never picked up the Lantus.  The patient gives often conflicting information regarding her insulin usage.  Essentially, it appears that the patient has not taken any long-acting insulin since her discharge on 01/27/2021.  She states that she may have intermittently taken some short acting insulin over the weekend after which she was discharged.  Nevertheless, it has been well documented regarding the patient's poor compliance with her medications.  She has had numerous admissions in the hospital for her DKA and uncontrolled hyperglycemia.  The patient states that she came to the hospital for preop evaluation for her EGD and colonoscopy scheduled for 01/31/21.  Apparently, the patient was noted to have a CBG reading of "high".  As result, the patient was sent to emergency department for further evaluation.  The patient endorses polydipsia and polyuria.  She states that she has had nausea and vomiting for the past 2 days at home.  She denies any fevers, chills, headache, sore throat.  She has chronic intermittent chest discomfort and dyspnea on exertion.  She denies any diarrhea, hematochezia, melena.  She states that she has intermittent abdominal pain. In the emergency department, the patient was afebrile and hemodynamically stable with heart rate 110-120.  Oxygen saturation was 90-100% on room air.  BMP showed a sodium 131,  potassium 4.4, serum creatinine 0.0.  Serum glucose was 753.  Anion gap was 10.  WBC 5.3, hemoglobin 12.7, platelets 286,000.  Chest x-ray was negative for any infiltrates or edema.  VBG showed 7.347/47/35/22.  The patient was started on intravenous insulin and IV fluids. Assessment/Plan: Hyperosmolar state -patient started on IV insulin with q 1 hour CBG check and q 4 hour BMPs -pt started on aggressive fluid resuscitation -Electrolytes were monitored and repleted -transitioned to [place] insulin once anion gap closed -diet was advanced once anion gap closed -HbA1C >15.5 on 08/25/20 -transitioned to Lantus 35 units once hyperglycemia is improved  Crohn's disease -Apparently, patient is scheduled for colonoscopy and EGD on 01/31/2021 -Consult GI to see if this could be done as an inpatient  Sinus tachycardia -Secondary to volume depletion and acute medical illness -Obtain EKG  Atypical chest pain -Likely secondary to the patient's vomiting -Cycle troponins -Chest x-ray without any infiltrates or edema  Anasarca -Secondary to hypoalbuminemia and proteinuria -The patient had 3.95 g of protein on urine protein creatinine ratio on 10/18/2020 -Restart lisinopril -Holding furosemide for today secondary to volume depletion -10/19/2020 echo EF 65 to 70%, no WMA, trivial MR  Bipolar disorder -Continue trazodone and Celexa  Hyponatremia -Secondary to hyperglycemia  Essential hypertension -Continue lisinopril       Past Medical History:  Diagnosis Date  . ADHD (attention deficit hyperactivity disorder) 2007  . Allergy   . Asthma   . Bipolar disorder ([place])   . Crohn's colitis ([place])    Dx Dr. Cherrie Gauze  . Depression    early childhood,  trial of seroquel?  . Diabetic neuropathy ([place])   . Edema   . GERD (gastroesophageal reflux disease)   . Hypertension   . Pancreatitis   . Panic attacks   . PTSD (post-traumatic stress disorder)   . Stroke ([place]) 07/2020   R  side  . Type 1 diabetes ([place]) 2008   Past Surgical History:  Procedure Laterality Date  . ADENOIDECTOMY    . HERNIA REPAIR    . TONSILLECTOMY     Social History:  reports that she has never smoked. She has never used smokeless tobacco. She reports that she does not drink alcohol and does not use drugs.   Family History  Problem Relation Age of Onset  . Hypertension Mother   . Diabetes Mother   . Heart disease Mother   . Crohn's disease Father   . Alcohol abuse Father   . Drug abuse Father   . Mental illness Father   . Cancer Father   . Hypertension Maternal Aunt   . Seizures Maternal Aunt   . Depression Maternal Aunt   . Asthma Maternal Uncle   . Hypertension Maternal Uncle   . Cancer Maternal Grandmother   . Diabetes Maternal Grandmother   . Mental illness Maternal Grandfather      Allergies  Allergen Reactions  . Amoxicillin Hives    Did it involve swelling of the face/tongue/throat, SOB, or low BP? no  Did it involve sudden or severe rash/hives, skin peeling, or any reaction on the inside of your mouth or nose? no Did you need to seek medical attention at a hospital or doctor's office? no When did it last happen?unk If all above answers are "NO", may proceed with cephalosporin use.  Marland Kitchen Dexamethasone Hives and Itching  . Doxycycline Itching and Swelling  . Penicillins Hives and Itching    Did it involve swelling of the face/tongue/throat, SOB, or low BP? no  Did it involve sudden or severe rash/hives, skin peeling, or any reaction on the inside of your mouth or nose? no Did you need to seek medical attention at a hospital or doctor's office? no When did it last happen?unk If all above answers are "NO", may proceed with cephalosporin use. Did it involve swelling of the face/tongue/throat, SOB, or low BP? no  Did it involve sudden or severe rash/hives, skin peeling, or any reaction on the inside of your mouth or nose? no Did you need to seek medical  attention at a hospital or doctor's office? no When did it last happen?unk If all above answers are "NO", may proceed with cephalosporin use. Tolerated keflex May 2021 with no issue.   . Augmentin [Amoxicillin-Pot Clavulanate] Other (See Comments)    Reaction unknown  . Ceftriaxone Sodium In Dextrose   . Clavulanic Acid   . Adhesive [Tape] Itching and Rash  . Latex Itching and Rash  . Other Itching and Rash  . Pineapple Rash    Rash on tongue and throat     Prior to Admission medications   Medication Sig Start Date End Date Taking? Authorizing Provider  albuterol (VENTOLIN HFA) 108 (90 Base) MCG/ACT inhaler Inhale 2 puffs into the lungs every 6 (six) hours as needed. For shortness of breath Patient taking differently: Inhale 2 puffs into the lungs every 6 (six) hours as needed for shortness of breath or wheezing. For shortness of breath 10/21/20  Yes Emokpae, Courage, MD  alfuzosin (UROXATRAL) 10 MG 24 hr tablet Take 1 tablet (10 mg total) by mouth  daily with breakfast. 11/24/20  Yes McKenzie, Candee Furbish, MD  citalopram (CELEXA) 10 MG tablet Take 10 mg by mouth daily.   Yes [provider]  furosemide (LASIX) 20 MG tablet Take 100 mg by mouth daily.   Yes [provider]  gabapentin (NEURONTIN) 100 MG capsule Take 1 capsule (100 mg total) by mouth daily. 10/13/20  Yes [name], PA-C  insulin lispro (HUMALOG KWIKPEN) 100 UNIT/ML KwikPen Inject 10-16 Units into the skin 3 (three) times daily before meals. Patient taking differently: Inject 10-16 Units into the skin 3 (three) times daily before meals. Per sliding scale 11/01/20  Yes Nida, Marella Chimes, MD  lisinopril (ZESTRIL) 20 MG tablet Take 1 tablet (20 mg total) by mouth daily. 11/01/20  Yes Nida, Marella Chimes, MD  ondansetron (ZOFRAN) 4 MG tablet Take 1 tablet (4 mg total) by mouth in the morning, at noon, in the evening, and at bedtime. Patient taking differently: Take 4 mg by mouth every 8  (eight) hours as needed for vomiting or nausea. 12/13/20  Yes Carver, Elon Alas, DO  potassium chloride (KLOR-CON) 10 MEQ tablet Take 1 tablet (10 mEq total) by mouth daily. Take While taking Lasix/furosemide 10/21/20  Yes Emokpae, Courage, MD  tamsulosin (FLOMAX) 0.4 MG CAPS capsule Take 1 capsule (0.4 mg total) by mouth daily. 12/13/20  Yes McKenzie, Candee Furbish, MD  traZODone (DESYREL) 50 MG tablet Take 50 mg by mouth at bedtime.   Yes [provider]  insulin glargine (LANTUS) 100 UNIT/ML injection Inject 0.35 mLs (35 Units total) into the skin daily. 01/27/21   Manuella Ghazi, Pratik D, DO  Insulin Pen Needle (PEN NEEDLES) 31G X 5 MM MISC To use with lantus. 01/27/21   Manuella Ghazi, Pratik D, DO  promethazine (PHENERGAN) 25 MG tablet Take 1 tablet (25 mg total) by mouth every 8 (eight) hours as needed for nausea or vomiting. Patient not taking: Reported on 01/30/2021 12/05/20   [name], PA-C    Review of Systems:  Constitutional:  No weight loss, night sweats, Fevers, chills, fatigue.  Head&Eyes: No headache.  No vision loss.  No eye pain or scotoma ENT:  No Difficulty swallowing,Tooth/dental problems,Sore throat,  No ear ache, post nasal drip,  Cardio-vascular:  No chest pain, Orthopnea, PND, swelling in lower extremities,  dizziness, palpitations  GI:  No   loss of appetite, hematochezia, melena, heartburn, indigestion, Resp:  No shortness of breath with exertion or at rest. No cough. No coughing up of blood .No wheezing.No chest wall deformity  Skin:  no rash or lesions.  GU:  no dysuria, change in color of urine, no urgency or frequency. No flank pain.  Musculoskeletal:  No joint pain or swelling. No decreased range of motion. No back pain.  Psych:  No change in mood or affect. No depression or anxiety. Neurologic: No headache, no dysesthesia, no focal weakness, no vision loss. No syncope  Physical Exam: Vitals:   01/30/21 1250 01/30/21 1515 01/30/21 1554 01/30/21 1640  BP:  107/77 (!) 143/106 (!) 148/116 (!) 140/117  Pulse: (!) 110 (!) 104 90 (!) 101  Resp: 14 16 18 19   Temp:      TempSrc:      SpO2: 99% 100% 100% 100%  Weight:   63.3 kg   Height:   4' 11"  (1.499 m)    General:  A&O x 3, NAD, nontoxic, pleasant/cooperative Head/Eye: No conjunctival hemorrhage, no icterus, [place]/AT, No nystagmus ENT:  No icterus,  No thrush, good dentition, no pharyngeal  exudate Neck:  No masses, no lymphadenpathy, no bruits CV:  RRR, no rub, no gallop, no S3 Lung:  CTAB, good air movement, no wheeze, no rhonchi Abdomen: soft/NT, +BS, nondistended, no peritoneal signs Ext: No cyanosis, No rashes, No petechiae, No lymphangitis, No edema Neuro: CNII-XII intact, strength 4/5 in bilateral upper and lower extremities, no dysmetria  Labs on Admission:  Basic Metabolic Panel: Recent Labs  Lab 01/25/21 0834 01/26/21 0420 01/27/21 0536 01/30/21 0907 01/30/21 1006 01/30/21 1424  NA 138 137 132* 134* 131* 144  K 3.8 4.5 4.2 4.5 4.4 3.7  CL 100 102 98 97* 96* 106  CO2 29 27 29 24 25 27   GLUCOSE 317* 196* 257* 710* 753* 159*  BUN 12 14 19 13 12 12   CREATININE 0.79 0.71 0.88 0.78 0.80 0.70  CALCIUM 8.6* 8.4* 8.3* 8.7* 8.8* 9.0  MG 1.8 1.9 1.8  --   --   --    Liver Function Tests: Recent Labs  Lab 01/23/21 1806  AST 16  ALT 20  ALKPHOS 106  BILITOT 0.8  PROT 7.1  ALBUMIN 2.4*   Recent Labs  Lab 01/23/21 1806  LIPASE 27   No results for input(s): AMMONIA in the last 168 hours. CBC: Recent Labs  Lab 01/23/21 1806 01/26/21 0420 01/30/21 1006  WBC 6.9 6.7 5.3  NEUTROABS 4.3  --  3.3  HGB 12.3 11.1* 12.7  HCT 37.0 33.8* 39.5  MCV 88.3 90.4 93.2  PLT 383 335 386   Coagulation Profile: No results for input(s): INR, PROTIME in the last 168 hours. Cardiac Enzymes: No results for input(s): CKTOTAL, CKMB, CKMBINDEX, TROPONINI in the last 168 hours. BNP: Invalid input(s): POCBNP CBG: Recent Labs  Lab 01/30/21 1207 01/30/21 1302 01/30/21 1403  01/30/21 1509 01/30/21 1615  GLUCAP 389* 294* 183* 146* 132*   Urine analysis:    Component Value Date/Time   COLORURINE STRAW (A) 01/30/2021 0956   APPEARANCEUR CLEAR 01/30/2021 0956   APPEARANCEUR Clear 11/15/2020 1531   LABSPEC 1.008 01/30/2021 0956   PHURINE 6.0 01/30/2021 0956   GLUCOSEU >=500 (A) 01/30/2021 0956   HGBUR SMALL (A) 01/30/2021 0956   BILIRUBINUR NEGATIVE 01/30/2021 0956   BILIRUBINUR NEG 12/05/2020 1353   BILIRUBINUR Negative 11/15/2020 1531   KETONESUR NEGATIVE 01/30/2021 0956   PROTEINUR 100 (A) 01/30/2021 0956   UROBILINOGEN 0.2 12/05/2020 1353   UROBILINOGEN 0.2 09/13/2012 2014   NITRITE NEGATIVE 01/30/2021 0956   LEUKOCYTESUR NEGATIVE 01/30/2021 0956   Sepsis Labs: @LABRCNTIP (procalcitonin:4,lacticidven:4) ) Recent Results (from the past 240 hour(s))  Resp Panel by RT-PCR (Flu A&B, Covid) Nasopharyngeal Swab     Status: None   Collection Time: 01/23/21  7:26 PM   Specimen: Nasopharyngeal Swab; Nasopharyngeal(NP) swabs in vial transport medium  Result Value Ref Range Status   SARS Coronavirus 2 by RT PCR NEGATIVE NEGATIVE Final    Comment: (NOTE) SARS-CoV-2 target nucleic acids are NOT DETECTED.  The SARS-CoV-2 RNA is generally detectable in upper respiratory specimens during the acute phase of infection. The lowest concentration of SARS-CoV-2 viral copies this assay can detect is 138 copies/mL. A negative result does not preclude SARS-Cov-2 infection and should not be used as the sole basis for treatment or other patient management decisions. A negative result may occur with  improper specimen collection/handling, submission of specimen other than nasopharyngeal swab, presence of viral mutation(s) within the areas targeted by this assay, and inadequate number of viral copies(<138 copies/mL). A negative result must be combined with clinical observations, patient  history, and epidemiological information. The expected result is Negative.  Fact  Sheet for Patients:  EntrepreneurPulse.com.au  Fact Sheet for Healthcare Providers:  IncredibleEmployment.be  This test is no t yet approved or cleared by the [place] FDA and  has been authorized for detection and/or diagnosis of SARS-CoV-2 by FDA under an Emergency Use Authorization (EUA). This EUA will remain  in effect (meaning this test can be used) for the duration of the COVID-19 declaration under Section 564(b)(1) of the Act, 21 U.S.C.section 360bbb-3(b)(1), unless the authorization is terminated  or revoked sooner.       Influenza A by PCR NEGATIVE NEGATIVE Final   Influenza B by PCR NEGATIVE NEGATIVE Final    Comment: (NOTE) The Xpert Xpress SARS-CoV-2/FLU/RSV plus assay is intended as an aid in the diagnosis of influenza from Nasopharyngeal swab specimens and should not be used as a sole basis for treatment. Nasal washings and aspirates are unacceptable for Xpert Xpress SARS-CoV-2/FLU/RSV testing.  Fact Sheet for Patients: EntrepreneurPulse.com.au  Fact Sheet for Healthcare Providers: IncredibleEmployment.be  This test is not yet approved or cleared by the [place] FDA and has been authorized for detection and/or diagnosis of SARS-CoV-2 by FDA under an Emergency Use Authorization (EUA). This EUA will remain in effect (meaning this test can be used) for the duration of the COVID-19 declaration under Section 564(b)(1) of the Act, 21 U.S.C. section 360bbb-3(b)(1), unless the authorization is terminated or revoked.  Performed at [facility], [address], [place], [place] [address]   MRSA PCR Screening     Status: None   Collection Time: 01/23/21 10:49 PM   Specimen: Nasal Mucosa; Nasopharyngeal  Result Value Ref Range Status   MRSA by PCR NEGATIVE NEGATIVE Final    Comment:        The GeneXpert MRSA Assay (FDA approved for NASAL specimens only), is one component of  a comprehensive MRSA colonization surveillance program. It is not intended to diagnose MRSA infection nor to guide or monitor treatment for MRSA infections. Performed at [facility], [address] [place], [place] [address]   Resp Panel by RT-PCR (Flu A&B, Covid) Nasopharyngeal Swab     Status: None   Collection Time: 01/30/21 10:29 AM   Specimen: Nasopharyngeal Swab; Nasopharyngeal(NP) swabs in vial transport medium  Result Value Ref Range Status   SARS Coronavirus 2 by RT PCR NEGATIVE NEGATIVE Final    Comment: (NOTE) SARS-CoV-2 target nucleic acids are NOT DETECTED.  The SARS-CoV-2 RNA is generally detectable in upper respiratory specimens during the acute phase of infection. The lowest concentration of SARS-CoV-2 viral copies this assay can detect is 138 copies/mL. A negative result does not preclude SARS-Cov-2 infection and should not be used as the sole basis for treatment or other patient management decisions. A negative result may occur with  improper specimen collection/handling, submission of specimen other than nasopharyngeal swab, presence of viral mutation(s) within the areas targeted by this assay, and inadequate number of viral copies(<138 copies/mL). A negative result must be combined with clinical observations, patient history, and epidemiological information. The expected result is Negative.  Fact Sheet for Patients:  EntrepreneurPulse.com.au  Fact Sheet for Healthcare Providers:  IncredibleEmployment.be  This test is no t yet approved or cleared by the [place] FDA and  has been authorized for detection and/or diagnosis of SARS-CoV-2 by FDA under an Emergency Use Authorization (EUA). This EUA will remain  in effect (meaning this test can be used) for the duration of the COVID-19 declaration under  Section 564(b)(1) of the Act, 21 U.S.C.section 360bbb-3(b)(1), unless the authorization is terminated  or revoked  sooner.       Influenza A by PCR NEGATIVE NEGATIVE Final   Influenza B by PCR NEGATIVE NEGATIVE Final    Comment: (NOTE) The Xpert Xpress SARS-CoV-2/FLU/RSV plus assay is intended as an aid in the diagnosis of influenza from Nasopharyngeal swab specimens and should not be used as a sole basis for treatment. Nasal washings and aspirates are unacceptable for Xpert Xpress SARS-CoV-2/FLU/RSV testing.  Fact Sheet for Patients: EntrepreneurPulse.com.au  Fact Sheet for Healthcare Providers: IncredibleEmployment.be  This test is not yet approved or cleared by the [place] FDA and has been authorized for detection and/or diagnosis of SARS-CoV-2 by FDA under an Emergency Use Authorization (EUA). This EUA will remain in effect (meaning this test can be used) for the duration of the COVID-19 declaration under Section 564(b)(1) of the Act, 21 U.S.C. section 360bbb-3(b)(1), unless the authorization is terminated or revoked.  Performed at [facility], [address], [place], [place] [address]      Radiological Exams on Admission: DG Chest Portable 1 View  Result Date: 01/30/2021 CLINICAL DATA:  Lower extremity edema. Chest pain and elevated blood sugar. EXAM: PORTABLE CHEST 1 VIEW COMPARISON:  01/23/2021 FINDINGS: 1017 hours. The lungs are clear without focal pneumonia, edema, pneumothorax or pleural effusion. The cardiopericardial silhouette is within normal limits for size. The visualized bony structures of the thorax show no acute abnormality. IMPRESSION: No active disease. Electronically Signed   By: Misty Stanley M.D.   On: 01/30/2021 10:28    EKG: Independently reviewed. Sinus, nonspecific T wave change    Time spent:60 minutes Code Status:   FULL Family Communication:  No Family at bedside Disposition Plan: expect 2 day hospitalization Consults called: GI DVT Prophylaxis: [place] Lovenox  Orson Eva, DO  [facility] Pager  [phone]  If 7PM-7AM, please contact night-coverage [email or web address] Password [email or web address] 01/30/2021, 5:10 PM

## 2021-01-30 NOTE — ED Provider Notes (Signed)
[facility] EMERGENCY DEPARTMENT Provider Note   CSN: [id number] Arrival date & time: 01/30/21  6256     History Chief Complaint  Patient presents with  . Hyperglycemia    Diamond [name] is a 25 y.o. female with past medical history significant for Crohn's, diabetic neuropathy, GERD, prior CVA, type 1 diabetes who presents for evaluation of hyperglycemia.  Patiently recently discharged from hospital for DKA 3 days ago.  Patient states she was going in today to have an EGD and colonoscopy and was noted to have blood sugars that read "high."  Patient states she has "mostly" compliant with her insulin.  States she does 6 shots daily.  She is not on the insulin pump.  Patient states her blood sugars have been reading "high" at home.  Patient states she continues to have diffuse lower extremity edema that goes into her abdomen.  She had this on prior admission.  She was diuresed with Lasix.  Patient states she has been compliant with her Lasix at home.  She denies fever, chills, nausea, vomiting, chest pain, shortness of breath, abdominal pain, diarrhea, dysuria.  States her leg edema is symmetric.  No history of PE or DVT.  No associated redness, warmth.  No recent falls or injuries.  Does note a diabetic ulcer to her right lateral calf which is been there x1 week.  Has not noted any discharge to this, redness or warmth.  Denies additional aggravating or alleviating factors. Notes polyuria without polydipsia  History obtained from patient and past medical records.  No interpreter is used.  HPI     Past Medical History:  Diagnosis Date  . ADHD (attention deficit hyperactivity disorder) 2007  . Allergy   . Asthma   . Bipolar disorder ([place])   . Crohn's colitis ([place])    Dx Dr. Cherrie Gauze  . Depression    early childhood, trial of seroquel?  . Diabetic neuropathy ([place])   . Edema   . GERD (gastroesophageal reflux disease)   . Hypertension   . Pancreatitis   . Panic attacks   .  PTSD (post-traumatic stress disorder)   . Stroke ( [place]) 07/2020   R side  . Type 1 diabetes ([place]) 2008    Patient Active Problem List   Diagnosis Date Noted  . Hyperosmolar hyperglycemic state (HHS) ([place]) 01/30/2021  . Crohn's disease ([place]) 12/08/2020  . Gastroparesis 12/08/2020  . Essential hypertension, benign 11/01/2020  . Edema 10/19/2020  . Anasarca 10/18/2020  . Hypoalbuminemia 10/18/2020  . Personal history of noncompliance with medical treatment, presenting hazards to health 10/12/2020  . Bilateral lower extremity edema 08/25/2020  . Pneumonia 08/25/2020  . Eczema 10/23/2012  . Abdominal pain 08/27/2012  . GERD (gastroesophageal reflux disease) 07/25/2012  . Mood disorder ([place]) 07/09/2012  . Contraception management 07/09/2012  . History of Crohn's disease 07/09/2012  . Uncontrolled type 1 diabetes mellitus with hyperglycemia ([place]) 07/08/2012  . Asthma, persistent controlled 07/08/2012    Past Surgical History:  Procedure Laterality Date  . ADENOIDECTOMY    . HERNIA REPAIR    . TONSILLECTOMY       OB History   No obstetric history on file.     Family History  Problem Relation Age of Onset  . Hypertension Mother   . Diabetes Mother   . Heart disease Mother   . Crohn's disease Father   . Alcohol abuse Father   . Drug abuse Father   . Mental illness Father   . Cancer Father   .  Hypertension Maternal Aunt   . Seizures Maternal Aunt   . Depression Maternal Aunt   . Asthma Maternal Uncle   . Hypertension Maternal Uncle   . Cancer Maternal Grandmother   . Diabetes Maternal Grandmother   . Mental illness Maternal Grandfather     Social History   Tobacco Use  . Smoking status: Never Smoker  . Smokeless tobacco: Never Used  Vaping Use  . Vaping Use: Former  Substance Use Topics  . Alcohol use: No  . Drug use: No    Home Medications Prior to Admission medications   Medication Sig Start Date End Date Taking? Authorizing Provider  albuterol  (VENTOLIN HFA) 108 (90 Base) MCG/ACT inhaler Inhale 2 puffs into the lungs every 6 (six) hours as needed. For shortness of breath Patient taking differently: Inhale 2 puffs into the lungs every 6 (six) hours as needed for shortness of breath or wheezing. For shortness of breath 10/21/20  Yes Emokpae, Courage, MD  alfuzosin (UROXATRAL) 10 MG 24 hr tablet Take 1 tablet (10 mg total) by mouth daily with breakfast. 11/24/20  Yes McKenzie, Candee Furbish, MD  citalopram (CELEXA) 10 MG tablet Take 10 mg by mouth daily.   Yes [provider]  furosemide (LASIX) 20 MG tablet Take 100 mg by mouth daily.   Yes [provider]  gabapentin (NEURONTIN) 100 MG capsule Take 1 capsule (100 mg total) by mouth daily. 10/13/20  Yes [name], PA-C  insulin lispro (HUMALOG KWIKPEN) 100 UNIT/ML KwikPen Inject 10-16 Units into the skin 3 (three) times daily before meals. Patient taking differently: Inject 10-16 Units into the skin 3 (three) times daily before meals. Per sliding scale 11/01/20  Yes Nida, Marella Chimes, MD  lisinopril (ZESTRIL) 20 MG tablet Take 1 tablet (20 mg total) by mouth daily. 11/01/20  Yes Nida, Marella Chimes, MD  ondansetron (ZOFRAN) 4 MG tablet Take 1 tablet (4 mg total) by mouth in the morning, at noon, in the evening, and at bedtime. Patient taking differently: Take 4 mg by mouth every 8 (eight) hours as needed for vomiting or nausea. 12/13/20  Yes Carver, Elon Alas, DO  potassium chloride (KLOR-CON) 10 MEQ tablet Take 1 tablet (10 mEq total) by mouth daily. Take While taking Lasix/furosemide 10/21/20  Yes Emokpae, Courage, MD  tamsulosin (FLOMAX) 0.4 MG CAPS capsule Take 1 capsule (0.4 mg total) by mouth daily. 12/13/20  Yes McKenzie, Candee Furbish, MD  traZODone (DESYREL) 50 MG tablet Take 50 mg by mouth at bedtime.   Yes [provider]  insulin glargine (LANTUS) 100 UNIT/ML injection Inject 0.35 mLs (35 Units total) into the skin daily. 01/27/21   Manuella Ghazi, Pratik D, DO   Insulin Pen Needle (PEN NEEDLES) 31G X 5 MM MISC To use with lantus. 01/27/21   Manuella Ghazi, Pratik D, DO  promethazine (PHENERGAN) 25 MG tablet Take 1 tablet (25 mg total) by mouth every 8 (eight) hours as needed for nausea or vomiting. Patient not taking: Reported on 01/30/2021 12/05/20   [name], PA-C    Allergies    Amoxicillin, Dexamethasone, Doxycycline, Penicillins, Augmentin [amoxicillin-pot clavulanate], Ceftriaxone sodium in dextrose, Clavulanic acid, Adhesive [tape], Latex, Other, and Pineapple  Review of Systems   Review of Systems  Constitutional: Negative.   HENT: Negative.   Respiratory: Negative.   Cardiovascular: Positive for leg swelling (BL).  Gastrointestinal: Positive for abdominal distention. Negative for abdominal pain, constipation, diarrhea, nausea, rectal pain and vomiting.  Genitourinary: Negative.   Musculoskeletal: Negative.   Skin: Negative.  Neurological: Negative.   All other systems reviewed and are negative.   Physical Exam Updated Vital Signs BP 138/89   Pulse (!) 109   Temp 98.5 F (36.9 C) (Oral)   Resp 14   Ht 4' 11"  (1.499 m)   Wt 63.3 kg   SpO2 98%   BMI 28.19 kg/m   Physical Exam Vitals and nursing note reviewed.  Constitutional:      General: She is not in acute distress.    Appearance: She is well-developed and well-nourished. She is ill-appearing. She is not toxic-appearing.  HENT:     Head: Normocephalic and atraumatic.     Nose: Nose normal.     Mouth/Throat:     Mouth: Mucous membranes are moist.  Eyes:     Pupils: Pupils are equal, round, and reactive to light.  Cardiovascular:     Rate and Rhythm: Tachycardia present.     Pulses: Intact distal pulses.     Heart sounds: Normal heart sounds.  Pulmonary:     Effort: Pulmonary effort is normal. No respiratory distress.     Breath sounds: Normal breath sounds.     Comments: Speaks in full sentences without difficulty.  Clear to auscultation bilaterally. Abdominal:      General: Bowel sounds are normal. There is distension.     Tenderness: There is no abdominal tenderness. There is no right CVA tenderness, left CVA tenderness, guarding or rebound.     Comments: Distended, no tenderness.  No fluid wave.  Musculoskeletal:        General: Normal range of motion.     Cervical back: Normal range of motion.     Comments: No bony tenderness.  Moves all 4 extremities without difficulty.  2+ pitting edema to mid thigh bilaterally.  Skin:    General: Skin is warm and dry.     Capillary Refill: Capillary refill takes less than 2 seconds.     Comments: Superficial diabetic wound to right lateral calf.  No drainage, redness, warmth.  Neurological:     General: No focal deficit present.     Mental Status: She is alert and oriented to person, place, and time.     Comments: Intact sensation Cranial nerves II through XII grossly intact ANO x3  Psychiatric:        Mood and Affect: Mood and affect normal.     ED Results / Procedures / Treatments   Labs (all labs ordered are listed, but only abnormal results are displayed) Labs Reviewed  BASIC METABOLIC PANEL - Abnormal; Notable for the following components:      Result Value   Sodium 131 (*)    Chloride 96 (*)    Glucose, Bld 753 (*)    Calcium 8.8 (*)    All other components within normal limits  URINALYSIS, ROUTINE W REFLEX MICROSCOPIC - Abnormal; Notable for the following components:   Color, Urine STRAW (*)    Glucose, UA >=500 (*)    Hgb urine dipstick SMALL (*)    Protein, ur 100 (*)    All other components within normal limits  CBG MONITORING, ED - Abnormal; Notable for the following components:   Glucose-Capillary 584 (*)    All other components within normal limits  CBG MONITORING, ED - Abnormal; Notable for the following components:   Glucose-Capillary 476 (*)    All other components within normal limits  CBG MONITORING, ED - Abnormal; Notable for the following components:   Glucose-Capillary  455 (*)  All other components within normal limits  CBG MONITORING, ED - Abnormal; Notable for the following components:   Glucose-Capillary 389 (*)    All other components within normal limits  RESP PANEL BY RT-PCR (FLU A&B, COVID) ARPGX2  CBC WITH DIFFERENTIAL/PLATELET  BETA-HYDROXYBUTYRIC ACID  BLOOD GAS, VENOUS  BRAIN NATRIURETIC PEPTIDE  PREGNANCY, URINE  POC URINE PREG, ED  CBG MONITORING, ED  TROPONIN I (HIGH SENSITIVITY)    EKG None  Radiology DG Chest Portable 1 View  Result Date: 01/30/2021 CLINICAL DATA:  Lower extremity edema. Chest pain and elevated blood sugar. EXAM: PORTABLE CHEST 1 VIEW COMPARISON:  01/23/2021 FINDINGS: 1017 hours. The lungs are clear without focal pneumonia, edema, pneumothorax or pleural effusion. The cardiopericardial silhouette is within normal limits for size. The visualized bony structures of the thorax show no acute abnormality. IMPRESSION: No active disease. Electronically Signed   By: Misty Stanley M.D.   On: 01/30/2021 10:28    Procedures .Critical Care Performed by: Nettie Elm, PA-C Authorized by: Nettie Elm, PA-C   Critical care provider statement:    Critical care time (minutes):  45   Critical care was necessary to treat or prevent imminent or life-threatening deterioration of the following conditions:  Endocrine crisis   Critical care was time spent personally by me on the following activities:  Discussions with consultants, evaluation of patient's response to treatment, examination of patient, ordering and performing treatments and interventions, ordering and review of laboratory studies, ordering and review of radiographic studies, pulse oximetry, re-evaluation of patient's condition, obtaining history from patient or surrogate and review of old charts     Medications Ordered in ED Medications  insulin regular, human (MYXREDLIN) 100 units/ 100 mL infusion (4.6 Units/hr Intravenous Rate/Dose Change 01/30/21 1209)   lactated ringers infusion ( Intravenous New Bag/Given 01/30/21 1127)  dextrose 5 % in lactated ringers infusion (has no administration in time range)  dextrose 50 % solution 0-50 mL (has no administration in time range)  sodium chloride 0.9 % bolus 1,000 mL (0 mLs Intravenous Stopped 01/30/21 1125)    ED Course  I have reviewed the triage vital signs and the nursing notes.  Pertinent labs & imaging results that were available during my care of the patient were reviewed by me and considered in my medical decision making (see chart for details).  25 year old type I diabetic, recurrent medical noncompliance presents for evaluation of hyperglycemia.  Noted to have elevated blood sugars while going in for procedure earlier today.  On arrival she is tachycardic her denies any chest pain, shortness of breath.  Her heart and lungs are clear.  Her abdomen is distended, however nontender.  Low suspicion for SBP.  He does have 2+ pitting edema to her mid thighs bilaterally.  No unilateral leg swelling, redness or warmth.  I have low suspicion for PE or DVT.  She does have beginnings of what looks like a diabetic ulcer to her right lateral calf however does not appear actively infected.  She is neurovascularly intact.  Metabolic panel from surgery center shows blood sugar greater than 700 however no anion gap.  Plan for labs and imaging.  Started on insulin drip.  Will give fluid bolus for possible developing DKA however also concerned given her significant lower extremity edema into her abdomen consistent with likely anasarca.  Prior hospitalization in November had echo with EF 65 to 70% Bilateral ultrasound negative for DVT 6 days ago  Labs and imaging personally reviewed and interpreted: CBC  without leukocytosis, hemoglobin 12.7 at baseline BMP sodium 131, within normal limits when corrected for hyperglycemia of 753, calcium 8.8 BNP 76 VBG without acidosis, normal bicarb Resp panel Covid, flu  negative UA negative for infection, no ketones or his proteinuria and glucosuria Preg negative Beta Hydroxy 0.07 DG chest without cardiomegaly, pulmonary edema, pneumothorax or infiltrates  Patient reassessed.  Persistently tachycardic, elevated blood sugars.  She was started on insulin drip and fluid bolus for her significant hyperglycemia.  Unclear etiology of her lower extremity edema however per prior note reviewed this has been persistent over a long period of time.  Her chest x-ray does not show any evidence of pulmonary edema and her prior echo no which I reviewed did not show any evidence of reduced EF.  Will likely need some Lasix while inpatient.  She does not appear septic at this time.  Will admit for significant hyperglycemia.  CONSULT with Dr. Carles Collet with [facility] agrees to evaluate patient for admission  The patient appears reasonably stabilized for admission considering the current resources, flow, and capabilities available in the ED at this time, and I doubt any other [facility] requiring further screening and/or treatment in the ED prior to admission.    MDM Rules/Calculators/A&P                           Final Clinical Impression(s) / ED Diagnoses Final diagnoses:  Hyperglycemia  Anasarca  Lower extremity edema    Rx / DC Orders ED Discharge Orders    None       Henderly, Britni A, PA-C 01/30/21 1246    Horton, Alvin Critchley, DO 02/02/21 1753

## 2021-01-30 NOTE — Progress Notes (Signed)
History and Physical  Diamond [name] DOB: 12-25-1995 DOA: 01/30/2021   PCP: [name], PA-C   Patient coming from: Home  Chief Complaint: high sugars  HPI:  Diamond [name] is a 25 y.o. female with medical history of DM type 1, bipolar disorder, anasarca, Crohn's Disease, and hypertension presenting with high blood sugars.  The patient was recently discharged from the hospital after a stay from 01/23/21 to 01/17/21 for treatment of DKA.  The patient was discharged home with instructions to get Lantus from the free clinic.  Apparently, the patient never picked up the Lantus.  The patient gives often conflicting information regarding her insulin usage.  Essentially, it appears that the patient has not taken any long-acting insulin since her discharge on 01/27/2021.  She states that she may have intermittently taken some short acting insulin over the weekend after which she was discharged.  Nevertheless, it has been well documented regarding the patient's poor compliance with her medications.  She has had numerous admissions in the hospital for her DKA and uncontrolled hyperglycemia.  The patient states that she came to the hospital for preop evaluation for her EGD and colonoscopy scheduled for 01/31/21.  Apparently, the patient was noted to have a CBG reading of "high".  As result, the patient was sent to emergency department for further evaluation.  The patient endorses polydipsia and polyuria.  She states that she has had nausea and vomiting for the past 2 days at home.  She denies any fevers, chills, headache, sore throat.  She has chronic intermittent chest discomfort and dyspnea on exertion.  She denies any diarrhea, hematochezia, melena.  She states that she has intermittent abdominal pain. In the emergency department, the patient was afebrile and hemodynamically stable with heart rate 110-120.  Oxygen saturation was 90-100% on room air.  BMP showed a sodium 131, potassium  4.4, serum creatinine 0.0.  Serum glucose was 753.  Anion gap was 10.  WBC 5.3, hemoglobin 12.7, platelets 286,000.  Chest x-ray was negative for any infiltrates or edema.  VBG showed 7.347/47/35/22.  The patient was started on intravenous insulin and IV fluids.  Assessment/Plan: Hyperosmolar state -patient started on IV insulin with q 1 hour CBG check and q 4 hour BMPs -pt started on aggressive fluid resuscitation -Electrolytes were monitored and repleted -transitioned to [place] insulin once anion gap closed -diet was advanced once anion gap closed -HbA1C >15.5 on 08/25/20 -transitioned to Lantus 35 units once hyperglycemia is improved  Crohn's disease -Apparently, patient is scheduled for colonoscopy and EGD on 01/31/2021 -Consult GI to see if this could be done as an inpatient  Sinus tachycardia -Secondary to volume depletion and acute medical illness -Obtain EKG  Atypical chest pain -Likely secondary to the patient's vomiting -Cycle troponins -Chest x-ray without any infiltrates or edema  Anasarca -Secondary to hypoalbuminemia and proteinuria -The patient had 3.95 g of protein on urine protein creatinine ratio on 10/18/2020 -Restart lisinopril -Holding furosemide for today secondary to volume depletion -10/19/2020 echo EF 65 to 70%, no WMA, trivial MR  Bipolar disorder -Continue trazodone and Celexa  Hyponatremia -Secondary to hyperglycemia  Essential hypertension -Continue lisinopril       Past Medical History:  Diagnosis Date  . ADHD (attention deficit hyperactivity disorder) 2007  . Allergy   . Asthma   . Bipolar disorder ([place])   . Crohn's colitis ([place])    Dx Dr. Cherrie Gauze  . Depression    early childhood, trial  of seroquel?  . Diabetic neuropathy ([place])   . Edema   . GERD (gastroesophageal reflux disease)   . Hypertension   . Pancreatitis   . Panic attacks   . PTSD (post-traumatic stress disorder)   . Stroke ([place]) 07/2020   R side  . Type 1  diabetes ([place]) 2008   Past Surgical History:  Procedure Laterality Date  . ADENOIDECTOMY    . HERNIA REPAIR    . TONSILLECTOMY     Social History:  reports that she has never smoked. She has never used smokeless tobacco. She reports that she does not drink alcohol and does not use drugs.   Family History  Problem Relation Age of Onset  . Hypertension Mother   . Diabetes Mother   . Heart disease Mother   . Crohn's disease Father   . Alcohol abuse Father   . Drug abuse Father   . Mental illness Father   . Cancer Father   . Hypertension Maternal Aunt   . Seizures Maternal Aunt   . Depression Maternal Aunt   . Asthma Maternal Uncle   . Hypertension Maternal Uncle   . Cancer Maternal Grandmother   . Diabetes Maternal Grandmother   . Mental illness Maternal Grandfather      Allergies  Allergen Reactions  . Amoxicillin Hives    Did it involve swelling of the face/tongue/throat, SOB, or low BP? no  Did it involve sudden or severe rash/hives, skin peeling, or any reaction on the inside of your mouth or nose? no Did you need to seek medical attention at a hospital or doctor's office? no When did it last happen?unk If all above answers are "NO", may proceed with cephalosporin use.  Marland Kitchen Dexamethasone Hives and Itching  . Doxycycline Itching and Swelling  . Penicillins Hives and Itching    Did it involve swelling of the face/tongue/throat, SOB, or low BP? no  Did it involve sudden or severe rash/hives, skin peeling, or any reaction on the inside of your mouth or nose? no Did you need to seek medical attention at a hospital or doctor's office? no When did it last happen?unk If all above answers are "NO", may proceed with cephalosporin use. Did it involve swelling of the face/tongue/throat, SOB, or low BP? no  Did it involve sudden or severe rash/hives, skin peeling, or any reaction on the inside of your mouth or nose? no Did you need to seek medical attention at a  hospital or doctor's office? no When did it last happen?unk If all above answers are "NO", may proceed with cephalosporin use. Tolerated keflex May 2021 with no issue.   . Augmentin [Amoxicillin-Pot Clavulanate] Other (See Comments)    Reaction unknown  . Ceftriaxone Sodium In Dextrose   . Clavulanic Acid   . Adhesive [Tape] Itching and Rash  . Latex Itching and Rash  . Other Itching and Rash  . Pineapple Rash    Rash on tongue and throat     Prior to Admission medications   Medication Sig Start Date End Date Taking? Authorizing Provider  albuterol (VENTOLIN HFA) 108 (90 Base) MCG/ACT inhaler Inhale 2 puffs into the lungs every 6 (six) hours as needed. For shortness of breath Patient taking differently: Inhale 2 puffs into the lungs every 6 (six) hours as needed for shortness of breath or wheezing. For shortness of breath 10/21/20   Roxan Hockey, MD  alfuzosin (UROXATRAL) 10 MG 24 hr tablet Take 1 tablet (10 mg total) by mouth daily  with breakfast. 11/24/20   McKenzie, Candee Furbish, MD  citalopram (CELEXA) 10 MG tablet Take 10 mg by mouth daily.    [provider]  furosemide (LASIX) 20 MG tablet Take 100 mg by mouth daily.    [provider]  gabapentin (NEURONTIN) 100 MG capsule Take 1 capsule (100 mg total) by mouth daily. 10/13/20   [name], PA-C  insulin glargine (LANTUS) 100 UNIT/ML injection Inject 0.35 mLs (35 Units total) into the skin daily. 01/27/21   Manuella Ghazi, Pratik D, DO  insulin lispro (HUMALOG KWIKPEN) 100 UNIT/ML KwikPen Inject 10-16 Units into the skin 3 (three) times daily before meals. Patient taking differently: Inject 10-16 Units into the skin 3 (three) times daily before meals. Per sliding scale 11/01/20   Cassandria Anger, MD  Insulin Pen Needle (PEN NEEDLES) 31G X 5 MM MISC To use with lantus. 01/27/21   Manuella Ghazi, Pratik D, DO  lisinopril (ZESTRIL) 20 MG tablet Take 1 tablet (20 mg total) by mouth daily. 11/01/20   Cassandria Anger, MD  ondansetron (ZOFRAN) 4 MG tablet Take 1 tablet (4 mg total) by mouth in the morning, at noon, in the evening, and at bedtime. Patient taking differently: No sig reported 12/13/20   Eloise Harman, DO  potassium chloride (KLOR-CON) 10 MEQ tablet Take 1 tablet (10 mEq total) by mouth daily. Take While taking Lasix/furosemide 10/21/20   Roxan Hockey, MD  promethazine (PHENERGAN) 25 MG tablet Take 1 tablet (25 mg total) by mouth every 8 (eight) hours as needed for nausea or vomiting. Patient taking differently: Take 25 mg by mouth every 6 (six) hours as needed for vomiting or nausea. 12/05/20   [name], PA-C  tamsulosin (FLOMAX) 0.4 MG CAPS capsule Take 1 capsule (0.4 mg total) by mouth daily. 12/13/20   McKenzie, Candee Furbish, MD  traZODone (DESYREL) 50 MG tablet Take 50 mg by mouth at bedtime.    [provider]    Review of Systems:  Constitutional:  No weight loss, night sweats,  Head&Eyes: No headache.  No vision loss.  No eye pain or scotoma ENT:  No Difficulty swallowing,Tooth/dental problems,Sore throat,  No ear ache, post nasal drip,  Cardio-vascular:  No chest pain, Orthopnea, PND, swelling in lower extremities,  dizziness, palpitations  GI:  No  diarrhea, loss of appetite, hematochezia, melena, heartburn, indigestion, Resp:   No cough. No coughing up of blood .No wheezing.No chest wall deformity  Skin:  no rash or lesions.  GU:  no dysuria, change in color of urine, no urgency or frequency. No flank pain.  Musculoskeletal:  No joint pain or swelling. No decreased range of motion. No back pain.  Psych:  No change in mood or affect. No depression or anxiety. Neurologic: No headache, no dysesthesia, no focal weakness, no vision loss. No syncope  Physical Exam: Vitals:   01/30/21 0857 01/30/21 1000 01/30/21 1030 01/30/21 1100  BP: 109/63 (!) 159/104 (!) 143/104 138/89  Pulse: (!) 115 (!) 118 (!) 117 (!) 109  Resp: 18 16 14 14   Temp: 98.5 F (36.9  C)     TempSrc: Oral     SpO2: 94% 100% 100% 98%  Weight: 63.3 kg     Height: 4' 11"  (1.499 m)      General:  A&O x 3, NAD, nontoxic, pleasant/cooperative Head/Eye: No conjunctival hemorrhage, no icterus, [place]/AT, No nystagmus ENT:  No icterus,  No thrush, good dentition, no pharyngeal exudate Neck:  No masses, no lymphadenpathy, no bruits  CV:  RRR, no rub, no gallop, no S3 Lung:  CTAB, good air movement, no wheeze, no rhonchi Abdomen: soft/NT, +BS, nondistended, no peritoneal signs Ext: No cyanosis, No rashes, No petechiae, No lymphangitis, No edema Neuro: CNII-XII intact, strength 4/5 in bilateral upper and lower extremities, no dysmetria  Labs on Admission:  Basic Metabolic Panel: Recent Labs  Lab 01/25/21 0834 01/26/21 0420 01/27/21 0536 01/30/21 0907 01/30/21 1006  NA 138 137 132* 134* 131*  K 3.8 4.5 4.2 4.5 4.4  CL 100 102 98 97* 96*  CO2 29 27 29 24 25   GLUCOSE 317* 196* 257* 710* 753*  BUN 12 14 19 13 12   CREATININE 0.79 0.71 0.88 0.78 0.80  CALCIUM 8.6* 8.4* 8.3* 8.7* 8.8*  MG 1.8 1.9 1.8  --   --    Liver Function Tests: Recent Labs  Lab 01/23/21 1806  AST 16  ALT 20  ALKPHOS 106  BILITOT 0.8  PROT 7.1  ALBUMIN 2.4*   Recent Labs  Lab 01/23/21 1806  LIPASE 27   No results for input(s): AMMONIA in the last 168 hours. CBC: Recent Labs  Lab 01/23/21 1806 01/26/21 0420 01/30/21 1006  WBC 6.9 6.7 5.3  NEUTROABS 4.3  --  3.3  HGB 12.3 11.1* 12.7  HCT 37.0 33.8* 39.5  MCV 88.3 90.4 93.2  PLT 383 335 386   Coagulation Profile: No results for input(s): INR, PROTIME in the last 168 hours. Cardiac Enzymes: No results for input(s): CKTOTAL, CKMB, CKMBINDEX, TROPONINI in the last 168 hours. BNP: Invalid input(s): POCBNP CBG: Recent Labs  Lab 01/30/21 0837 01/30/21 1031 01/30/21 1104 01/30/21 1134 01/30/21 1207  GLUCAP >600* 584* 476* 455* 389*   Urine analysis:    Component Value Date/Time   COLORURINE STRAW (A) 01/30/2021 0956    APPEARANCEUR CLEAR 01/30/2021 0956   APPEARANCEUR Clear 11/15/2020 1531   LABSPEC 1.008 01/30/2021 0956   PHURINE 6.0 01/30/2021 0956   GLUCOSEU >=500 (A) 01/30/2021 0956   HGBUR SMALL (A) 01/30/2021 0956   BILIRUBINUR NEGATIVE 01/30/2021 0956   BILIRUBINUR NEG 12/05/2020 1353   BILIRUBINUR Negative 11/15/2020 1531   KETONESUR NEGATIVE 01/30/2021 0956   PROTEINUR 100 (A) 01/30/2021 0956   UROBILINOGEN 0.2 12/05/2020 1353   UROBILINOGEN 0.2 09/13/2012 2014   NITRITE NEGATIVE 01/30/2021 0956   LEUKOCYTESUR NEGATIVE 01/30/2021 0956   Sepsis Labs: @LABRCNTIP (procalcitonin:4,lacticidven:4) ) Recent Results (from the past 240 hour(s))  Resp Panel by RT-PCR (Flu A&B, Covid) Nasopharyngeal Swab     Status: None   Collection Time: 01/23/21  7:26 PM   Specimen: Nasopharyngeal Swab; Nasopharyngeal(NP) swabs in vial transport medium  Result Value Ref Range Status   SARS Coronavirus 2 by RT PCR NEGATIVE NEGATIVE Final    Comment: (NOTE) SARS-CoV-2 target nucleic acids are NOT DETECTED.  The SARS-CoV-2 RNA is generally detectable in upper respiratory specimens during the acute phase of infection. The lowest concentration of SARS-CoV-2 viral copies this assay can detect is 138 copies/mL. A negative result does not preclude SARS-Cov-2 infection and should not be used as the sole basis for treatment or other patient management decisions. A negative result may occur with  improper specimen collection/handling, submission of specimen other than nasopharyngeal swab, presence of viral mutation(s) within the areas targeted by this assay, and inadequate number of viral copies(<138 copies/mL). A negative result must be combined with clinical observations, patient history, and epidemiological information. The expected result is Negative.  Fact Sheet for Patients:  EntrepreneurPulse.com.au  Fact Sheet for Healthcare Providers:  IncredibleEmployment.be  This  test is no t yet approved or cleared by the [place] and  has been authorized for detection and/or diagnosis of SARS-CoV-2 by FDA under an Emergency Use Authorization (EUA). This EUA will remain  in effect (meaning this test can be used) for the duration of the COVID-19 declaration under Section 564(b)(1) of the Act, 21 U.S.C.section 360bbb-3(b)(1), unless the authorization is terminated  or revoked sooner.       Influenza A by PCR NEGATIVE NEGATIVE Final   Influenza B by PCR NEGATIVE NEGATIVE Final    Comment: (NOTE) The Xpert Xpress SARS-CoV-2/FLU/RSV plus assay is intended as an aid in the diagnosis of influenza from Nasopharyngeal swab specimens and should not be used as a sole basis for treatment. Nasal washings and aspirates are unacceptable for Xpert Xpress SARS-CoV-2/FLU/RSV testing.  Fact Sheet for Patients: EntrepreneurPulse.com.au  Fact Sheet for Healthcare Providers: IncredibleEmployment.be  This test is not yet approved or cleared by the [place] FDA and has been authorized for detection and/or diagnosis of SARS-CoV-2 by FDA under an Emergency Use Authorization (EUA). This EUA will remain in effect (meaning this test can be used) for the duration of the COVID-19 declaration under Section 564(b)(1) of the Act, 21 U.S.C. section 360bbb-3(b)(1), unless the authorization is terminated or revoked.  Performed at [facility], [address] [place], [place] [address]   MRSA PCR Screening     Status: None   Collection Time: 01/23/21 10:49 PM   Specimen: Nasal Mucosa; Nasopharyngeal  Result Value Ref Range Status   MRSA by PCR NEGATIVE NEGATIVE Final    Comment:        The GeneXpert MRSA Assay (FDA approved for NASAL specimens only), is one component of a comprehensive MRSA colonization surveillance program. It is not intended to diagnose MRSA infection nor to guide or monitor treatment for MRSA  infections. Performed at [facility], [address], [place],  [address]   Resp Panel by RT-PCR (Flu A&B, Covid) Nasopharyngeal Swab     Status: None   Collection Time: 01/30/21 10:29 AM   Specimen: Nasopharyngeal Swab; Nasopharyngeal(NP) swabs in vial transport medium  Result Value Ref Range Status   SARS Coronavirus 2 by RT PCR NEGATIVE NEGATIVE Final    Comment: (NOTE) SARS-CoV-2 target nucleic acids are NOT DETECTED.  The SARS-CoV-2 RNA is generally detectable in upper respiratory specimens during the acute phase of infection. The lowest concentration of SARS-CoV-2 viral copies this assay can detect is 138 copies/mL. A negative result does not preclude SARS-Cov-2 infection and should not be used as the sole basis for treatment or other patient management decisions. A negative result may occur with  improper specimen collection/handling, submission of specimen other than nasopharyngeal swab, presence of viral mutation(s) within the areas targeted by this assay, and inadequate number of viral copies(<138 copies/mL). A negative result must be combined with clinical observations, patient history, and epidemiological information. The expected result is Negative.  Fact Sheet for Patients:  EntrepreneurPulse.com.au  Fact Sheet for Healthcare Providers:  IncredibleEmployment.be  This test is no t yet approved or cleared by the [place] FDA and  has been authorized for detection and/or diagnosis of SARS-CoV-2 by FDA under an Emergency Use Authorization (EUA). This EUA will remain  in effect (meaning this test can be used) for the duration of the COVID-19 declaration under Section 564(b)(1) of the Act, 21 U.S.C.section 360bbb-3(b)(1), unless the authorization is terminated  or revoked sooner.  Influenza A by PCR NEGATIVE NEGATIVE Final   Influenza B by PCR NEGATIVE NEGATIVE Final    Comment: (NOTE) The Xpert Xpress  SARS-CoV-2/FLU/RSV plus assay is intended as an aid in the diagnosis of influenza from Nasopharyngeal swab specimens and should not be used as a sole basis for treatment. Nasal washings and aspirates are unacceptable for Xpert Xpress SARS-CoV-2/FLU/RSV testing.  Fact Sheet for Patients: EntrepreneurPulse.com.au  Fact Sheet for Healthcare Providers: IncredibleEmployment.be  This test is not yet approved or cleared by the [place] FDA and has been authorized for detection and/or diagnosis of SARS-CoV-2 by FDA under an Emergency Use Authorization (EUA). This EUA will remain in effect (meaning this test can be used) for the duration of the COVID-19 declaration under Section 564(b)(1) of the Act, 21 U.S.C. section 360bbb-3(b)(1), unless the authorization is terminated or revoked.  Performed at [facility], [address], [place], [place] [address]      Radiological Exams on Admission: DG Chest Portable 1 View  Result Date: 01/30/2021 CLINICAL DATA:  Lower extremity edema. Chest pain and elevated blood sugar. EXAM: PORTABLE CHEST 1 VIEW COMPARISON:  01/23/2021 FINDINGS: 1017 hours. The lungs are clear without focal pneumonia, edema, pneumothorax or pleural effusion. The cardiopericardial silhouette is within normal limits for size. The visualized bony structures of the thorax show no acute abnormality. IMPRESSION: No active disease. Electronically Signed   By: Misty Stanley M.D.   On: 01/30/2021 10:28    EKG: Independently reviewed. pending    Time spent:60 minutes Code Status:   FULL Family Communication:  No Family at bedside Disposition Plan: expect 2-3 day hospitalization Consults called: GI DVT Prophylaxis: [place] Lovenox  Orson Eva, DO  [facility] Pager [phone]  If 7PM-7AM, please contact night-coverage [email or web address] Password [facility] 01/30/2021, 12:22 PM

## 2021-01-30 NOTE — Progress Notes (Signed)
Lantus given at 1738. Insulin gtt to be turned off at 1938. Will pass on to night shift RN.

## 2021-01-30 NOTE — ED Notes (Signed)
Date and time results received: 01/30/21 10:28 AM   Test: Glucose  Critical Value: 753  Name of Provider Notified: K. Horton DO  Orders Received? Or Actions Taken?: pending

## 2021-01-31 ENCOUNTER — Encounter (HOSPITAL_COMMUNITY): Admission: RE | Payer: Self-pay | Source: Home / Self Care

## 2021-01-31 ENCOUNTER — Ambulatory Visit (HOSPITAL_COMMUNITY): Admission: RE | Admit: 2021-01-31 | Payer: Medicaid Other | Source: Home / Self Care

## 2021-01-31 DIAGNOSIS — R1013 Epigastric pain: Secondary | ICD-10-CM

## 2021-01-31 DIAGNOSIS — R933 Abnormal findings on diagnostic imaging of other parts of digestive tract: Secondary | ICD-10-CM

## 2021-01-31 DIAGNOSIS — K5900 Constipation, unspecified: Secondary | ICD-10-CM

## 2021-01-31 LAB — HEPATIC FUNCTION PANEL
ALT: 23 U/L (ref 0–44)
AST: 27 U/L (ref 15–41)
Albumin: 1.9 g/dL — ABNORMAL LOW (ref 3.5–5.0)
Alkaline Phosphatase: 69 U/L (ref 38–126)
Bilirubin, Direct: 0.2 mg/dL (ref 0.0–0.2)
Indirect Bilirubin: 0.6 mg/dL (ref 0.3–0.9)
Total Bilirubin: 0.8 mg/dL (ref 0.3–1.2)
Total Protein: 5.7 g/dL — ABNORMAL LOW (ref 6.5–8.1)

## 2021-01-31 LAB — BASIC METABOLIC PANEL
Anion gap: 8 (ref 5–15)
BUN: 10 mg/dL (ref 6–20)
CO2: 27 mmol/L (ref 22–32)
Calcium: 8.3 mg/dL — ABNORMAL LOW (ref 8.9–10.3)
Chloride: 104 mmol/L (ref 98–111)
Creatinine, Ser: 0.6 mg/dL (ref 0.44–1.00)
GFR, Estimated: >60 mL/min (ref >60)
Glucose, Bld: 71 mg/dL (ref 70–99)
Potassium: 3.9 mmol/L (ref 3.5–5.1)
Sodium: 139 mmol/L (ref 135–145)

## 2021-01-31 LAB — CBC
HCT: 30.2 % — ABNORMAL LOW (ref 36.0–46.0)
Hemoglobin: 9.9 g/dL — ABNORMAL LOW (ref 12.0–15.0)
MCH: 29.7 pg (ref 26.0–34.0)
MCHC: 32.8 g/dL (ref 30.0–36.0)
MCV: 90.7 fL (ref 80.0–100.0)
Platelets: 329 10*3/uL (ref 150–400)
RBC: 3.33 MIL/uL — ABNORMAL LOW (ref 3.87–5.11)
RDW: 12.6 % (ref 11.5–15.5)
WBC: 5 10*3/uL (ref 4.0–10.5)
nRBC: 0 % (ref 0.0–0.2)

## 2021-01-31 LAB — GLUCOSE, CAPILLARY
Glucose-Capillary: 50 mg/dL — ABNORMAL LOW (ref 70–99)
Glucose-Capillary: 69 mg/dL — ABNORMAL LOW (ref 70–99)
Glucose-Capillary: 125 mg/dL — ABNORMAL HIGH (ref 70–99)
Glucose-Capillary: 122 mg/dL — ABNORMAL HIGH (ref 70–99)
Glucose-Capillary: 62 mg/dL — ABNORMAL LOW (ref 70–99)
Glucose-Capillary: 108 mg/dL — ABNORMAL HIGH (ref 70–99)
Glucose-Capillary: 135 mg/dL — ABNORMAL HIGH (ref 70–99)

## 2021-01-31 LAB — TSH: TSH: 2.228 u[IU]/mL (ref 0.350–4.500)

## 2021-01-31 SURGERY — COLONOSCOPY WITH PROPOFOL
Anesthesia: Monitor Anesthesia Care

## 2021-01-31 MED ORDER — INSULIN GLARGINE 100 UNIT/ML ~~LOC~~ SOLN
30.0000 [IU] | Freq: Every day | SUBCUTANEOUS | Status: DC
  Administered 2021-01-31: 30 [IU] via SUBCUTANEOUS
  Filled 2021-01-31 (×3): qty 0.3

## 2021-01-31 MED ORDER — CHLORHEXIDINE GLUCONATE CLOTH 2 % EX PADS
6.0000 | MEDICATED_PAD | Freq: Every day | CUTANEOUS | Status: DC
  Administered 2021-01-31 – 2021-02-02 (×3): 6 via TOPICAL

## 2021-01-31 MED ORDER — INSULIN ASPART 100 UNIT/ML ~~LOC~~ SOLN
0.0000 [IU] | Freq: Three times a day (TID) | SUBCUTANEOUS | Status: DC
  Administered 2021-01-31: 1 [IU] via SUBCUTANEOUS
  Administered 2021-02-02: 2 [IU] via SUBCUTANEOUS
  Administered 2021-02-03 (×2): 1 [IU] via SUBCUTANEOUS

## 2021-01-31 MED ORDER — PEG 3350-KCL-NA BICARB-NACL 420 G PO SOLR
4000.0000 mL | Freq: Once | ORAL | Status: AC
  Administered 2021-01-31: 4000 mL via ORAL

## 2021-01-31 MED ORDER — INSULIN ASPART 100 UNIT/ML ~~LOC~~ SOLN: 0.0000 [IU] | Freq: Every day | SUBCUTANEOUS | Status: DC

## 2021-01-31 MED ORDER — POLYETHYLENE GLYCOL 3350 17 GM/SCOOP PO POWD
1.0000 | Freq: Once | ORAL | Status: DC
  Filled 2021-01-31: qty 255

## 2021-01-31 MED ORDER — ALBUMIN HUMAN 25 % IV SOLN
50.0000 g | Freq: Once | INTRAVENOUS | Status: AC
  Administered 2021-01-31: 50 g via INTRAVENOUS
  Filled 2021-01-31: qty 200

## 2021-01-31 NOTE — Progress Notes (Signed)
Hypoglycemic Event  CBG: 62  Treatment: 8 oz juice/soda  Symptoms: Shaky  Follow-up CBG: Time:1837 CBG Result:108  Possible Reasons for Event: Inadequate meal intake  Comments/MD notified:Tat. MD.    Virgina Evener Foley

## 2021-01-31 NOTE — Progress Notes (Signed)
Afternoon blood sugar was 122. 1 unit of Novolog given.

## 2021-01-31 NOTE — Progress Notes (Signed)
AM blood sugar did not cross over. CBG was 125. 2 units of Novolog given.

## 2021-01-31 NOTE — Progress Notes (Signed)
PROGRESS NOTE  Diamond [name] SWN:[id number] DOB: [birth year] DOA: 01/30/2021 PCP: [name], PA-C  Brief History:  25 y.o. female with medical history of DM type 1, bipolar disorder, anasarca, Crohn's Disease,and hypertension presenting with high blood sugars. The patient was recently discharged from the hospital after a stay from 01/23/21 to 2/15/22for treatment of DKA. The patient was discharged home with instructions to get Lantus from the free clinic. Apparently, the patient never picked up the Lantus. The patient gives often conflicting information regarding her insulin usage. Essentially, it appears that the patient has not taken any long-acting insulin since her discharge on 01/27/2021. She states that she may have intermittently taken some short acting insulin over the weekend after which she was discharged. Nevertheless, it has been well documented regarding the patient's poor compliance with her medications. She has had numerous admissions in the hospital for her DKA and uncontrolled hyperglycemia. The patient states that she came to the hospital forpreop evaluation for her EGD and colonoscopy scheduled for 01/31/21.Apparently, the patient was noted to have a CBG reading of "high". As result, the patient was sent to emergency department for further evaluation. The patient endorses polydipsia and polyuria. She states that she has had nausea and vomiting for the past 2 days at home. She denies any fevers, chills, headache, sore throat. She has chronic intermittent chest discomfort and dyspnea on exertion. She denies any diarrhea, hematochezia, melena. She states that she has intermittent abdominal pain. In the emergency department, the patient was afebrile and hemodynamically stable with heart rate 110-120. Oxygen saturation was 90-100% on room air. BMP showed a sodium 131, potassium 4.4, serum creatinine 0.0. Serum glucose was 753. Anion gap was 10. WBC 5.3,  hemoglobin 12.7, platelets 286,000. Chest x-ray was negative for any infiltrates or edema. VBG showed 7.347/47/35/22. The patient was started on intravenous insulin and IV fluids.  Assessment/Plan:  Hyperosmolar state -patient started on IV insulin with q 1 hour CBG check and q 4 hour BMPs -pt started on aggressive fluid resuscitation -Electrolytes were monitored and repleted -transitioned to [place] insulin once anion gap closed -diet was advanced once anion gap closed -HbA1C>15.5 on 08/25/20 -transitioned to Lantus 30 units once hyperglycemia is improved  Crohn's disease -Apparently, patient is scheduled for colonoscopy and EGD on 01/31/2021 -Consult GI-->plans colonoscopy 02/01/21  Throat Swelling sensation?? -personally visualized-->she has enlarged tonsils and large uvula, but NO tongue or lip swelling -no SOB or dysphagia -d/c lisinopril  Sinus tachycardia -Secondary to volume depletion and acute medical illness -personally reviewed EKg--sinus tach, no STT changes  Atypical chest pain -Likely secondary to the patient's vomiting -Cycle troponins 12>>11 -Chest x-ray without any infiltrates or edema  Anasarca -Secondary to hypoalbuminemia and proteinuria -The patient had 3.95 g ofprotein on urine protein creatinine ratio on 10/18/2020 -Restart lisinopril -Holding furosemide for today secondary to volume depletion -10/19/2020 echo EF 65 to 70%, no WMA, trivial MR  Bipolar disorder -Continue trazodone and Celexa  Hyponatremia -Secondary to hyperglycemia -resolved  Essential hypertension -discontinue lisinopril due to concerns of possible allergy       Status is: Inpatient  Remains inpatient appropriate because:IV treatments appropriate due to intensity of illness or inability to take PO   Dispo: The patient is from: Home              Anticipated d/c is to: Home              Patient currently is not  medically stable to d/c.   Difficult to place patient  No        Family Communication:  no Family at bedside  Consultants:  GI  Code Status:  FULL   DVT Prophylaxis:  [place] Lovenox   Procedures: As Listed in Progress Note Above  Antibiotics: None      Subjective: Patient feeling better.  No further f/c, cp, sob, n/v/d  Objective: Vitals:   01/31/21 1300 01/31/21 1400 01/31/21 1640 01/31/21 1647  BP:    (!) 145/102  Pulse: (!) 107 (!) 106  (!) 103  Resp: 19 (!) 9  17  Temp:   98 F (36.7 C)   TempSrc:      SpO2: 100% 100%  100%  Weight:      Height:        Intake/Output Summary (Last 24 hours) at 01/31/2021 1824 Last data filed at 01/31/2021 9163 Gross per 24 hour  Intake 1641.6 ml  Output --  Net 1641.6 ml   Weight change:  Exam:   General:  Pt is alert, follows commands appropriately, not in acute distress  HEENT: No icterus, No thrush, No neck mass, [place]/AT  Cardiovascular: RRR, S1/S2, no rubs, no gallops  Respiratory: CTA bilaterally, no wheezing, no crackles, no rhonchi  Abdomen: Soft/+BS, non tender, non distended, no guarding  Extremities: No edema, No lymphangitis, No petechiae, No rashes, no synovitis   Data Reviewed: I have personally reviewed following labs and imaging studies Basic Metabolic Panel: Recent Labs  Lab 01/25/21 0834 01/26/21 0420 01/27/21 0536 01/30/21 0907 01/30/21 1006 01/30/21 1424 01/31/21 0344  NA 138 137 132* 134* 131* 144 139  K 3.8 4.5 4.2 4.5 4.4 3.7 3.9  CL 100 102 98 97* 96* 106 104  CO2 29 27 29 24 25 27 27   GLUCOSE 317* 196* 257* 710* 753* 159* 71  BUN 12 14 19 13 12 12 10   CREATININE 0.79 0.71 0.88 0.78 0.80 0.70 0.60  CALCIUM 8.6* 8.4* 8.3* 8.7* 8.8* 9.0 8.3*  MG 1.8 1.9 1.8  --   --   --   --    Liver Function Tests: Recent Labs  Lab 01/30/21 1919 01/31/21 0344  AST 22 27  ALT 25 23  ALKPHOS 81 69  BILITOT 0.6 0.8  PROT 5.8* 5.7*  ALBUMIN 2.1* 1.9*   No results for input(s): LIPASE, AMYLASE in the last 168 hours. No results for input(s):  AMMONIA in the last 168 hours. Coagulation Profile: No results for input(s): INR, PROTIME in the last 168 hours. CBC: Recent Labs  Lab 01/26/21 0420 01/30/21 1006 01/31/21 0344  WBC 6.7 5.3 5.0  NEUTROABS  --  3.3  --   HGB 11.1* 12.7 9.9*  HCT 33.8* 39.5 30.2*  MCV 90.4 93.2 90.7  PLT 335 386 329   Cardiac Enzymes: No results for input(s): CKTOTAL, CKMB, CKMBINDEX, TROPONINI in the last 168 hours. BNP: Invalid input(s): POCBNP CBG: Recent Labs  Lab 01/31/21 0615 01/31/21 0647 01/31/21 0741 01/31/21 1157 01/31/21 1713  GLUCAP 50* 69* 125* 122* 62*   HbA1C: No results for input(s): HGBA1C in the last 72 hours. Urine analysis:    Component Value Date/Time   COLORURINE STRAW (A) 01/30/2021 0956   APPEARANCEUR CLEAR 01/30/2021 0956   APPEARANCEUR Clear 11/15/2020 1531   LABSPEC 1.008 01/30/2021 0956   PHURINE 6.0 01/30/2021 0956   GLUCOSEU >=500 (A) 01/30/2021 0956   HGBUR SMALL (A) 01/30/2021 0956   BILIRUBINUR NEGATIVE 01/30/2021 0956   BILIRUBINUR  NEG 12/05/2020 1353   BILIRUBINUR Negative 11/15/2020 1531   KETONESUR NEGATIVE 01/30/2021 0956   PROTEINUR 100 (A) 01/30/2021 0956   UROBILINOGEN 0.2 12/05/2020 1353   UROBILINOGEN 0.2 09/13/2012 2014   NITRITE NEGATIVE 01/30/2021 0956   LEUKOCYTESUR NEGATIVE 01/30/2021 0956   Sepsis Labs: @LABRCNTIP (procalcitonin:4,lacticidven:4) ) Recent Results (from the past 240 hour(s))  Resp Panel by RT-PCR (Flu A&B, Covid) Nasopharyngeal Swab     Status: None   Collection Time: 01/23/21  7:26 PM   Specimen: Nasopharyngeal Swab; Nasopharyngeal(NP) swabs in vial transport medium  Result Value Ref Range Status   SARS Coronavirus 2 by RT PCR NEGATIVE NEGATIVE Final    Comment: (NOTE) SARS-CoV-2 target nucleic acids are NOT DETECTED.  The SARS-CoV-2 RNA is generally detectable in upper respiratory specimens during the acute phase of infection. The lowest concentration of SARS-CoV-2 viral copies this assay can detect  is 138 copies/mL. A negative result does not preclude SARS-Cov-2 infection and should not be used as the sole basis for treatment or other patient management decisions. A negative result may occur with  improper specimen collection/handling, submission of specimen other than nasopharyngeal swab, presence of viral mutation(s) within the areas targeted by this assay, and inadequate number of viral copies(<138 copies/mL). A negative result must be combined with clinical observations, patient history, and epidemiological information. The expected result is Negative.  Fact Sheet for Patients:  EntrepreneurPulse.com.au  Fact Sheet for Healthcare Providers:  IncredibleEmployment.be  This test is no t yet approved or cleared by the [place] FDA and  has been authorized for detection and/or diagnosis of SARS-CoV-2 by FDA under an Emergency Use Authorization (EUA). This EUA will remain  in effect (meaning this test can be used) for the duration of the COVID-19 declaration under Section 564(b)(1) of the Act, 21 U.S.C.section 360bbb-3(b)(1), unless the authorization is terminated  or revoked sooner.       Influenza A by PCR NEGATIVE NEGATIVE Final   Influenza B by PCR NEGATIVE NEGATIVE Final    Comment: (NOTE) The Xpert Xpress SARS-CoV-2/FLU/RSV plus assay is intended as an aid in the diagnosis of influenza from Nasopharyngeal swab specimens and should not be used as a sole basis for treatment. Nasal washings and aspirates are unacceptable for Xpert Xpress SARS-CoV-2/FLU/RSV testing.  Fact Sheet for Patients: EntrepreneurPulse.com.au  Fact Sheet for Healthcare Providers: IncredibleEmployment.be  This test is not yet approved or cleared by the [place] FDA and has been authorized for detection and/or diagnosis of SARS-CoV-2 by FDA under an Emergency Use Authorization (EUA). This EUA will remain in effect  (meaning this test can be used) for the duration of the COVID-19 declaration under Section 564(b)(1) of the Act, 21 U.S.C. section 360bbb-3(b)(1), unless the authorization is terminated or revoked.  Performed at [facility], [address], [place], [place] [address]   MRSA PCR Screening     Status: None   Collection Time: 01/23/21 10:49 PM   Specimen: Nasal Mucosa; Nasopharyngeal  Result Value Ref Range Status   MRSA by PCR NEGATIVE NEGATIVE Final    Comment:        The GeneXpert MRSA Assay (FDA approved for NASAL specimens only), is one component of a comprehensive MRSA colonization surveillance program. It is not intended to diagnose MRSA infection nor to guide or monitor treatment for MRSA infections. Performed at [facility], [address], [place], [place] [address]   Resp Panel by RT-PCR (Flu A&B, Covid) Nasopharyngeal Swab     Status: None   Collection  Time: 01/30/21 10:29 AM   Specimen: Nasopharyngeal Swab; Nasopharyngeal(NP) swabs in vial transport medium  Result Value Ref Range Status   SARS Coronavirus 2 by RT PCR NEGATIVE NEGATIVE Final    Comment: (NOTE) SARS-CoV-2 target nucleic acids are NOT DETECTED.  The SARS-CoV-2 RNA is generally detectable in upper respiratory specimens during the acute phase of infection. The lowest concentration of SARS-CoV-2 viral copies this assay can detect is 138 copies/mL. A negative result does not preclude SARS-Cov-2 infection and should not be used as the sole basis for treatment or other patient management decisions. A negative result may occur with  improper specimen collection/handling, submission of specimen other than nasopharyngeal swab, presence of viral mutation(s) within the areas targeted by this assay, and inadequate number of viral copies(<138 copies/mL). A negative result must be combined with clinical observations, patient history, and epidemiological information. The expected result is Negative.  Fact Sheet  for Patients:  EntrepreneurPulse.com.au  Fact Sheet for Healthcare Providers:  IncredibleEmployment.be  This test is no t yet approved or cleared by the [place] FDA and  has been authorized for detection and/or diagnosis of SARS-CoV-2 by FDA under an Emergency Use Authorization (EUA). This EUA will remain  in effect (meaning this test can be used) for the duration of the COVID-19 declaration under Section 564(b)(1) of the Act, 21 U.S.C.section 360bbb-3(b)(1), unless the authorization is terminated  or revoked sooner.       Influenza A by PCR NEGATIVE NEGATIVE Final   Influenza B by PCR NEGATIVE NEGATIVE Final    Comment: (NOTE) The Xpert Xpress SARS-CoV-2/FLU/RSV plus assay is intended as an aid in the diagnosis of influenza from Nasopharyngeal swab specimens and should not be used as a sole basis for treatment. Nasal washings and aspirates are unacceptable for Xpert Xpress SARS-CoV-2/FLU/RSV testing.  Fact Sheet for Patients: EntrepreneurPulse.com.au  Fact Sheet for Healthcare Providers: IncredibleEmployment.be  This test is not yet approved or cleared by the [place] FDA and has been authorized for detection and/or diagnosis of SARS-CoV-2 by FDA under an Emergency Use Authorization (EUA). This EUA will remain in effect (meaning this test can be used) for the duration of the COVID-19 declaration under Section 564(b)(1) of the Act, 21 U.S.C. section 360bbb-3(b)(1), unless the authorization is terminated or revoked.  Performed at [facility], [address] [place], [place] [address]      Scheduled Meds: . alfuzosin  10 mg Oral Q breakfast  . Chlorhexidine Gluconate Cloth  6 each Topical Daily  . citalopram  10 mg Oral Daily  . enoxaparin (LOVENOX) injection  40 mg Subcutaneous Q24H  . gabapentin  100 mg Oral Daily  . insulin aspart  0-5 Units Subcutaneous QHS  . insulin aspart  0-9  Units Subcutaneous TID WC  . insulin glargine  30 Units Subcutaneous Daily  . ondansetron  4 mg Oral TID AC & HS  . pantoprazole  40 mg Oral Daily  . polyethylene glycol-electrolytes  4,000 mL Oral Once  . tamsulosin  0.4 mg Oral Daily  . traZODone  50 mg Oral QHS   Continuous Infusions: . albumin human      Procedures/Studies: US Venous Img Lower Bilateral  Result Date: 01/24/2021 CLINICAL DATA:  Shortness of breath. Bilateral lower extremity swelling. EXAM: BILATERAL LOWER EXTREMITY VENOUS DOPPLER ULTRASOUND TECHNIQUE: Gray-scale sonography with compression, as well as color and duplex ultrasound, were performed to evaluate the deep venous system(s) from the level of the common femoral vein through the popliteal and proximal calf  veins. COMPARISON:  No prior FINDINGS: VENOUS Normal compressibility of the common femoral, superficial femoral, and popliteal veins, as well as the visualized calf veins. Visualized portions of profunda femoral vein and great saphenous vein unremarkable. No filling defects to suggest DVT on grayscale or color Doppler imaging. Doppler waveforms show normal direction of venous flow, normal respiratory plasticity and response to augmentation. Limited views of the contralateral common femoral vein are unremarkable. OTHER Prominent bilateral inguinal lymph nodes noted. The largest is in right groin and measures 2.9 cm. Bilateral lower extremity edema noted. IMPRESSION: 1.  No evidence of DVT. 2. Prominent bilateral inguinal lymph nodes noted. The largest is in the right groin and measures 2.9 cm. Bilateral lower extremity edema noted. Electronically Signed   By: Marcello Moores  Register   On: 01/24/2021 10:35   DG Chest Portable 1 View  Result Date: 01/30/2021 CLINICAL DATA:  Lower extremity edema. Chest pain and elevated blood sugar. EXAM: PORTABLE CHEST 1 VIEW COMPARISON:  01/23/2021 FINDINGS: 1017 hours. The lungs are clear without focal pneumonia, edema, pneumothorax or pleural  effusion. The cardiopericardial silhouette is within normal limits for size. The visualized bony structures of the thorax show no acute abnormality. IMPRESSION: No active disease. Electronically Signed   By: Misty Stanley M.D.   On: 01/30/2021 10:28   DG Chest Port 1 View  Result Date: 01/23/2021 CLINICAL DATA:  Shortness of breath with lower extremity edema EXAM: PORTABLE CHEST 1 VIEW COMPARISON:  Chest radiograph January 17, 2021; CT angiogram chest January 17, 2021 FINDINGS: Lungs are clear. Heart size and pulmonary vascularity are normal. No adenopathy. No pneumothorax. No bone lesions. IMPRESSION: Lungs clear.  Cardiac silhouette normal. Electronically Signed   By: Lowella Grip III M.D.   On: 01/23/2021 19:26    Orson Eva, DO  [facility]  If 7PM-7AM, please contact night-coverage [email or web address] Password [facility] 01/31/2021, 6:24 PM   LOS: 0 days

## 2021-01-31 NOTE — Progress Notes (Signed)
Pt is c/o difficulty to swallow and her throat being swollen. RN personally assessed throat and couldn't see tonsils or any of that area due to patient's tongue, no redness noted, but patient's face did look more swollen than yesterday. Will make MD aware, will continue to monitor.

## 2021-01-31 NOTE — Progress Notes (Signed)
Inpatient Diabetes Program Recommendations  AACE/ADA: New Consensus Statement on Inpatient Glycemic Control (2015)  Target Ranges:  Prepandial:   less than 140 mg/dL      Peak postprandial:   less than 180 mg/dL (1-2 hours)      Critically ill patients:  140 - 180 mg/dL   Lab Results  Component Value Date   GLUCAP 145 (H) 01/30/2021   HGBA1C 14.7 (A) 12/07/2020    Review of Glycemic Control Results for Diamond [name], Diamond [name] (MRN [id number]) as of 01/31/2021 09:28  Ref. Range 01/30/2021 17:10 01/30/2021 18:16 01/30/2021 19:17 01/30/2021 21:03  Glucose-Capillary Latest Ref Range: 70 - 99 mg/dL 171 (H) 139 (H) 167 (H) 145 (H)  Results for Diamond [name], Diamond [name] (MRN [id number]) as of 01/31/2021 09:28  Ref. Range 01/31/2021 03:44  Glucose Latest Ref Range: 70 - 99 mg/dL 71  Diabetes history: DM 1 Outpatient Diabetes medications:  Lantus 35 units daily, Humalog 10-16 units tid with meals Current orders for Inpatient glycemic control:  Lantus 35 units daily, Novolog moderate tid with meals and HS Inpatient Diabetes Program Recommendations:    Please reduce Lantus to 30 units daily and reduce Novolog correction to tid with meals and HS.   Thanks,  Adah Perl, RN, BC-ADM Inpatient Diabetes Coordinator Pager [phone] (8a-5p)

## 2021-01-31 NOTE — Progress Notes (Signed)
Subjective: Face feels is swollen. Also feels throat feels swollen.  Throat swelling to started this morning and is making it somewhat hard to swallow. No SOB. She did eat breakfast this morning and tolerated this well (clear liquids). No nausea or vomiting today. Had epigastric and LUQ pain this morning, improved with pain medication. None at this time. Had several BMs yesterday. Maybe 3-4 mushy BMs yesterday.  Passing small amounts of liquid stool today.   No brbpr, melena, or hematemesis.  No NSAIDs.  No alcohol.   Peripheral edema has been an issue since August 2021.   Doesn't eat much at home since abdominal issues began. Typically boiled eggs and oatmeal. Dinner varies but is a small meal.   Objective: Vital signs in last 24 hours: Temp:  [98.5 F (36.9 C)-98.9 F (37.2 C)] 98.8 F (37.1 C) (03/01 0409) Pulse Rate:  [90-118] 105 (03/01 0514) Resp:  [13-21] 18 (03/01 0514) BP: (107-160)/(63-117) 134/98 (03/01 0514) SpO2:  [94 %-100 %] 100 % (03/01 0514) Weight:  [62.1 kg-63.3 kg] 62.1 kg (03/01 0409) Last BM Date: 01/30/21 General:   Alert and oriented, pleasant, NAD.  Face is edematous. Head:  Normocephalic and atraumatic. Eyes:  No icterus, sclera clear. Conjuctiva pink.  Mouth:  Mucosa pink and moist without lesions, uvula is edematous with petechiations, no other significant erythema or lesions noted in pharynx.  Neck:  Supple, without thyromegaly or masses.  Abdomen:  Abdomen is round/full but soft with normoactive bowel sounds.  Mild TTP in the epigastric area and LUQ.  No rebound or guarding. No masses appreciated  Msk:  Symmetrical without gross deformities. Normal posture. Extremities:  With 2+ bilateral lower extremity pitting edema up to knees.   Neurologic:  Alert and  oriented x4;  grossly normal neurologically. Psych:  Normal mood and affect.  Intake/Output from previous day: 02/28 0701 - 03/01 0700 In: 3348.5 [I.V.:2348.5; IV Piggyback:1000] Out: -   Intake/Output this shift: No intake/output data recorded.  Lab Results: Recent Labs    01/30/21 1006 01/31/21 0344  WBC 5.3 5.0  HGB 12.7 9.9*  HCT 39.5 30.2*  PLT 386 329   BMET Recent Labs    01/30/21 1006 01/30/21 1424 01/31/21 0344  NA 131* 144 139  K 4.4 3.7 3.9  CL 96* 106 104  CO2 25 27 27   GLUCOSE 753* 159* 71  BUN 12 12 10   CREATININE 0.80 0.70 0.60  CALCIUM 8.8* 9.0 8.3*   LFT Recent Labs    01/30/21 1919 01/31/21 0344  PROT 5.8* 5.7*  ALBUMIN 2.1* 1.9*  AST 22 27  ALT 25 23  ALKPHOS 81 69  BILITOT 0.6 0.8  BILIDIR <0.1 0.2  IBILI NOT CALCULATED 0.6    Studies/Results: DG Chest Portable 1 View  Result Date: 01/30/2021 CLINICAL DATA:  Lower extremity edema. Chest pain and elevated blood sugar. EXAM: PORTABLE CHEST 1 VIEW COMPARISON:  01/23/2021 FINDINGS: 1017 hours. The lungs are clear without focal pneumonia, edema, pneumothorax or pleural effusion. The cardiopericardial silhouette is within normal limits for size. The visualized bony structures of the thorax show no acute abnormality. IMPRESSION: No active disease. Electronically Signed   By: Misty Stanley M.D.   On: 01/30/2021 10:28    Assessment: 25 year old female with multiple medical issues to include Type 1 diabetes, pancreatitis, gastroparesis, prior stroke, peripheral neuropathy, anasarca, lower extremity edema, possible Crohn's disease with colonoscopy/EGD in 2011 by Dr. Burr Medico at [facility] and prescribed Pentasa; however, she was lost to  follow-up and established care with [facility] in January 2022. Plans were for EGD/colonoscopy on 3/1. Now admitted with hyperosmolar state with initial blood glucose greater than 700.  Hyperglycemia has improved and insulin drip has been discontinued.  She is now on NovoLog and Lantus.  Epigastric pain: Admits to NSAIDs at times, alternating with Tylenol. Outside CT abdomen pelvis with contrast Jan 17, 2021: distended urinary bladder with questionable  posterior bladder wall thickening, progressive generalized subcutaneous edema of body wall fat, third-spacing, mild distal esophageal wall thickening with small amount of contrast in distal esophagus, increased size of bilateral inguinal lymph nodes. Moderate stool in colon. Planning on EGD tomorrow to evaluate further.   Possible Crohn's disease: Suspected possible early IBD at the time of colonoscopy in 2011 with [facility].  CT abdomen December 2021 with mild thickening of the TI, nonspecific. Planning on colonoscopy tomorrow.   Chronic constipation: Started on Linzess this admission with 3 BMs yesterday. Now passing small amounts of liquids stool. Notably, patient reports feeling her throat is swollen today.  Doubt allergic reaction, but will stop Linzess for now as we are starting bowel prep for planned colonoscopy tomorrow.  Will need better bowel regimen moving forward.    Gastroparesis: In the setting of uncontrolled diabetes.  Hemoglobin A1c>15.5 on 08/25/20. Doing well at this time with PPI and Zofran. Planning on EGD tomorrow to further evaluate persistent upper GI issues.   Anemia: Hemoglobin 12.7 on admission, down to 9.9 this morning.  This is likely in the setting of aggressive IV fluid hydration secondary to hyperglycemic hyperosmolar state.  No overt GI bleeding.  Anasarca: In the setting of hypoalbuminemia.  Albumin 1.9 this morning. Will give 50 g of 25% IV albumin today.  Hypoalbuminemia likely secondary to diabetes and kidney disease.   Throat swelling: Uvula appears edematous with some petechia.  No significant surrounding erythema or other lesions noted in the mouth or throat.  Doubt this is an allergic reaction to Linzess, but will stop Linzess for now.  Notified Dr. Carles Collet.  Plan: 50 g of 25% IV albumin Continue clear liquids today. N.p.o. at midnight. Start colon prep. Plan for colonoscopy and EGD with propofol with Dr. Laural Golden tomorrow. Hold tomorrows Lovenox dose.   Continue PPI daily.  May need to increase to twice daily pending EGD findings. Continue Zofran scheduled. Discontinue Linzess. Bowel regimen will need to be addressed moving forward. Reassess in the am to ensure good prep and throat swelling is not an issue.    LOS: 0 days    01/31/2021, 7:45 AM   Aliene Altes, [facility]

## 2021-02-01 ENCOUNTER — Inpatient Hospital Stay (HOSPITAL_COMMUNITY): Payer: Self-pay

## 2021-02-01 ENCOUNTER — Other Ambulatory Visit: Payer: Self-pay

## 2021-02-01 ENCOUNTER — Encounter (HOSPITAL_COMMUNITY): Payer: Self-pay | Admitting: Internal Medicine

## 2021-02-01 ENCOUNTER — Ambulatory Visit: Payer: Medicaid Other | Admitting: Urology

## 2021-02-01 ENCOUNTER — Inpatient Hospital Stay (HOSPITAL_COMMUNITY): Payer: Self-pay | Admitting: Certified Registered Nurse Anesthetist

## 2021-02-01 ENCOUNTER — Encounter (HOSPITAL_COMMUNITY)
Admission: EM | Disposition: A | Discharge status: Home / Self Care | Payer: Self-pay | Source: Home / Self Care | Attending: Internal Medicine

## 2021-02-01 HISTORY — PX: BIOPSY: SHX5522

## 2021-02-01 HISTORY — PX: ESOPHAGOGASTRODUODENOSCOPY (EGD) WITH PROPOFOL: SHX5813

## 2021-02-01 HISTORY — PX: COLONOSCOPY WITH PROPOFOL: SHX5780

## 2021-02-01 LAB — GLUCOSE, CAPILLARY
Glucose-Capillary: 77 mg/dL (ref 70–99)
Glucose-Capillary: 44 mg/dL — CL (ref 70–99)
Glucose-Capillary: 81 mg/dL (ref 70–99)
Glucose-Capillary: 55 mg/dL — ABNORMAL LOW (ref 70–99)
Glucose-Capillary: 58 mg/dL — ABNORMAL LOW (ref 70–99)
Glucose-Capillary: 100 mg/dL — ABNORMAL HIGH (ref 70–99)
Glucose-Capillary: 68 mg/dL — ABNORMAL LOW (ref 70–99)
Glucose-Capillary: 101 mg/dL — ABNORMAL HIGH (ref 70–99)
Glucose-Capillary: 75 mg/dL (ref 70–99)
Glucose-Capillary: 46 mg/dL — ABNORMAL LOW (ref 70–99)
Glucose-Capillary: 93 mg/dL (ref 70–99)
Glucose-Capillary: 124 mg/dL — ABNORMAL HIGH (ref 70–99)

## 2021-02-01 LAB — COMPREHENSIVE METABOLIC PANEL
ALT: 18 U/L (ref 0–44)
AST: 20 U/L (ref 15–41)
Albumin: 2.4 g/dL — ABNORMAL LOW (ref 3.5–5.0)
Alkaline Phosphatase: 60 U/L (ref 38–126)
Anion gap: 7 (ref 5–15)
BUN: 6 mg/dL (ref 6–20)
CO2: 27 mmol/L (ref 22–32)
Calcium: 8.2 mg/dL — ABNORMAL LOW (ref 8.9–10.3)
Chloride: 101 mmol/L (ref 98–111)
Creatinine, Ser: 0.64 mg/dL (ref 0.44–1.00)
GFR, Estimated: >60 mL/min (ref >60)
Glucose, Bld: 83 mg/dL (ref 70–99)
Potassium: 3.8 mmol/L (ref 3.5–5.1)
Sodium: 135 mmol/L (ref 135–145)
Total Bilirubin: 1.1 mg/dL (ref 0.3–1.2)
Total Protein: 5.6 g/dL — ABNORMAL LOW (ref 6.5–8.1)

## 2021-02-01 LAB — TROPONIN I (HIGH SENSITIVITY): Troponin I (High Sensitivity): 10 ng/L (ref <18)

## 2021-02-01 LAB — HEMOGLOBIN A1C
Hgb A1c MFr Bld: 14.3 % — ABNORMAL HIGH (ref 4.8–5.6)
Mean Plasma Glucose: 364 mg/dL

## 2021-02-01 SURGERY — ESOPHAGOGASTRODUODENOSCOPY (EGD) WITH PROPOFOL
Anesthesia: General

## 2021-02-01 MED ORDER — STERILE WATER FOR IRRIGATION IR SOLN
Status: DC | PRN
  Administered 2021-02-01: 200 mL

## 2021-02-01 MED ORDER — MAGNESIUM CITRATE PO SOLN
1.0000 | Freq: Once | ORAL | Status: AC
  Administered 2021-02-01: 1 via ORAL
  Filled 2021-02-01: qty 296

## 2021-02-01 MED ORDER — SODIUM CHLORIDE 0.9 % IV SOLN: INTRAVENOUS | Status: DC

## 2021-02-01 MED ORDER — SODIUM CHLORIDE 0.9 % IV SOLN: INTRAVENOUS | Status: DC | PRN

## 2021-02-01 MED ORDER — ENOXAPARIN SODIUM 40 MG/0.4ML ~~LOC~~ SOLN
40.0000 mg | SUBCUTANEOUS | Status: DC
  Administered 2021-02-01: 40 mg via SUBCUTANEOUS
  Filled 2021-02-01: qty 0.4

## 2021-02-01 MED ORDER — MIDAZOLAM HCL 2 MG/2ML IJ SOLN
INTRAMUSCULAR | Status: AC
  Filled 2021-02-01: qty 2

## 2021-02-01 MED ORDER — PROPOFOL 10 MG/ML IV BOLUS
INTRAVENOUS | Status: DC | PRN
  Administered 2021-02-01: 20 mg via INTRAVENOUS
  Administered 2021-02-01: 50 mg via INTRAVENOUS
  Administered 2021-02-01: 10 mg via INTRAVENOUS
  Administered 2021-02-01: 20 mg via INTRAVENOUS

## 2021-02-01 MED ORDER — NITROGLYCERIN 0.4 MG SL SUBL
0.4000 mg | SUBLINGUAL_TABLET | SUBLINGUAL | Status: DC | PRN
  Administered 2021-02-01: 0.4 mg via SUBLINGUAL
  Filled 2021-02-01 (×3): qty 1

## 2021-02-01 MED ORDER — INSULIN GLARGINE 100 UNIT/ML ~~LOC~~ SOLN
7.0000 [IU] | Freq: Every day | SUBCUTANEOUS | Status: DC
  Administered 2021-02-01 – 2021-02-02 (×2): 7 [IU] via SUBCUTANEOUS
  Filled 2021-02-01 (×5): qty 0.07

## 2021-02-01 MED ORDER — BISACODYL 5 MG PO TBEC
10.0000 mg | DELAYED_RELEASE_TABLET | Freq: Every day | ORAL | Status: DC | PRN
  Filled 2021-02-01: qty 2

## 2021-02-01 MED ORDER — KETOROLAC TROMETHAMINE 15 MG/ML IJ SOLN
15.0000 mg | Freq: Once | INTRAMUSCULAR | Status: AC
  Administered 2021-02-01: 15 mg via INTRAVENOUS
  Filled 2021-02-01: qty 1

## 2021-02-01 MED ORDER — PROPOFOL 10 MG/ML IV BOLUS
INTRAVENOUS | Status: AC
  Filled 2021-02-01: qty 20

## 2021-02-01 MED ORDER — PROPOFOL 500 MG/50ML IV EMUL
INTRAVENOUS | Status: DC | PRN
  Administered 2021-02-01: 150 ug/kg/min via INTRAVENOUS

## 2021-02-01 MED ORDER — DEXTROSE IN LACTATED RINGERS 5 % IV SOLN: INTRAVENOUS | Status: DC

## 2021-02-01 MED ORDER — DIPHENHYDRAMINE HCL 50 MG/ML IJ SOLN
25.0000 mg | Freq: Once | INTRAMUSCULAR | Status: AC
  Administered 2021-02-01: 25 mg via INTRAVENOUS
  Filled 2021-02-01: qty 1

## 2021-02-01 NOTE — Anesthesia Postprocedure Evaluation (Signed)
Anesthesia Post Note  Patient: Diamond [name]  Procedure(s) Performed: ESOPHAGOGASTRODUODENOSCOPY (EGD) WITH PROPOFOL (N/A ) COLONOSCOPY WITH PROPOFOL (N/A ) BIOPSY  Patient location during evaluation: PACU Anesthesia Type: General Level of consciousness: awake and alert and oriented Pain management: pain level controlled Respiratory status: spontaneous breathing Cardiovascular status: blood pressure returned to baseline and stable Postop Assessment: no apparent nausea or vomiting Anesthetic complications: no   No complications documented.   Last Vitals:  Vitals:   02/01/21 1235 02/01/21 1335  BP: (!) 146/102 (!) 141/109  Pulse: (!) 110 98  Resp: 19 (!) 26  Temp: 37.1 C 36.7 C  SpO2: 97% 100%    Last Pain:  Vitals:   02/01/21 1235  TempSrc: Oral  PainSc: 7                  Emryn Flanery

## 2021-02-01 NOTE — Op Note (Signed)
[facility] Patient Name: Diamond [name] Procedure Date: 02/01/2021 11:58 AM MRN: [id number] Date of Birth: 1996-03-16 Attending MD: Hildred Laser , MD CSN: [id number] Age: 25 Admit Type: Inpatient Procedure:                Upper GI endoscopy Indications:              Epigastric abdominal pain, Abnormal CT of the GI                            tract Providers:                Hildred Laser, MD, Lurline Del, RN, Randa Spike,                            Technician Referring MD:             Barton Dubois, MD Medicines:                Propofol per Anesthesia Complications:            No immediate complications. Estimated Blood Loss:     Estimated blood loss was minimal. Procedure:                Pre-Anesthesia Assessment:                           - Prior to the procedure, a History and Physical                            was performed, and patient medications and                            allergies were reviewed. The patient's tolerance of                            previous anesthesia was also reviewed. The risks                            and benefits of the procedure and the sedation                            options and risks were discussed with the patient.                            All questions were answered, and informed consent                            was obtained. Prior Anticoagulants: The patient                            last took Lovenox (enoxaparin) 1 day prior to the                            procedure. ASA Grade Assessment: IV - A patient  with severe systemic disease that is a constant                            threat to life. After reviewing the risks and                            benefits, the patient was deemed in satisfactory                            condition to undergo the procedure.                           After obtaining informed consent, the endoscope was                            passed under direct vision.  Throughout the                            procedure, the patient's blood pressure, pulse, and                            oxygen saturations were monitored continuously. The                            [id number]) was introduced through the mouth,                            and advanced to the second part of duodenum. The                            upper GI endoscopy was accomplished without                            difficulty. The patient tolerated the procedure                            well. Scope In: 12:53:43 PM Scope Out: 1:04:46 PM Total Procedure Duration: 0 hours 11 minutes 3 seconds  Findings:      The hypopharynx was normal.      Mild mucosal changes characterized by longitudinal markings were found       in the mid esophagus. Biopsies were taken with a cold forceps for       histology. The pathology specimen was placed into Bottle Number 2.      The Z-line was irregular and was found 38 cm from the incisors.      The entire examined stomach was normal.      The duodenal bulb and second portion of the duodenum were normal.       Biopsies were taken with a cold forceps for histology. The pathology       specimen was placed into Bottle Number 1. Impression:               - Normal hypopharynx.                           -  Longitudinally marked mucosa in the esophagus.                            Biopsied.                           - Z-line irregular, 38 cm from the incisors.                           - Normal stomach.                           - Normal duodenal bulb and second portion of the                            duodenum. Biopsied. Moderate Sedation:      Per Anesthesia Care Recommendation:           - Clear liquid diet today.                           - Continue present medications.                           - Await pathology results. Procedure Code(s):        --- Professional ---                           [phone], Esophagogastroduodenoscopy, flexible,                             transoral; with biopsy, single or multiple Diagnosis Code(s):        --- Professional ---                           K22.8, Other specified diseases of esophagus                           R10.13, Epigastric pain                           R93.3, Abnormal findings on diagnostic imaging of                            other parts of digestive tract CPT copyright 2019 [employer]. All rights reserved. The codes documented in this report are preliminary and upon coder review may  be revised to meet current compliance requirements. Hildred Laser, MD Hildred Laser, MD 02/01/2021 1:31:39 PM This report has been signed electronically. Number of Addenda: 0

## 2021-02-01 NOTE — Anesthesia Preprocedure Evaluation (Addendum)
Anesthesia Evaluation  Patient identified by MRN, date of birth, ID band Patient awake    Reviewed: Allergy & Precautions, NPO status , Patient's Chart, lab work & pertinent test results  History of Anesthesia Complications Negative for: history of anesthetic complications  Airway Mallampati: II  TM Distance: >3 FB Neck ROM: Full    Dental  (+) Dental Advisory Given, Missing, Chipped, Poor Dentition   Pulmonary asthma , pneumonia, resolved,           Cardiovascular Exercise Tolerance: Poor hypertension, Pt. on medications  Rhythm:Regular Rate:Normal     Neuro/Psych PSYCHIATRIC DISORDERS Anxiety Depression Bipolar Disorder  Neuromuscular disease CVA, Residual Symptoms    GI/Hepatic Neg liver ROS, GERD (gastroparesis )  Medicated,  Endo/Other  diabetes, Poorly Controlled, Type 2, Insulin Dependent  Renal/GU negative Renal ROS     Musculoskeletal negative musculoskeletal ROS (+)   Abdominal   Peds  (+) ADHD Hematology negative hematology ROS (+)   Anesthesia Other Findings   Reproductive/Obstetrics                          Anesthesia Physical Anesthesia Plan  ASA: IV  Anesthesia Plan: General   Post-op Pain Management:    Induction: Intravenous  PONV Risk Score and Plan: TIVA  Airway Management Planned: Nasal Cannula and Natural Airway  Additional Equipment:   Intra-op Plan:   Post-operative Plan:   Informed Consent: I have reviewed the patients History and Physical, chart, labs and discussed the procedure including the risks, benefits and alternatives for the proposed anesthesia with the patient or authorized representative who has indicated his/her understanding and acceptance.     Dental advisory given  Plan Discussed with: CRNA and Surgeon  Anesthesia Plan Comments:        Anesthesia Quick Evaluation

## 2021-02-01 NOTE — Progress Notes (Signed)
Brief EGD and colonoscopy notes  EGD Normal oropharynx. Few linear furrows at esophageal body otherwise normal mucosa.  Biopsies taken. Normal GE junction Normal examined the stomach first and second part of the duodenum Post bulbar random duodenal biopsies taken.  Colonoscopy  Examination completed to cecum but prep suboptimal To the mucosa that were visualized revealed no abnormality

## 2021-02-01 NOTE — Progress Notes (Addendum)
Patient completed bowel prep successfully. Tap water enema X 2 this morning. NPO except sips with meds. Aware of upcoming colonoscopy/EGD by Dr. Laural Golden today. Discussed risks and benefits with stated understanding. Denies any throat swelling (had reported this yesterday).    GI attending note.  Patient says she is passing clear stools.  She complains of pain in epigastric region as well as left upper quadrant.  She denies melena or rectal bleeding.  She also denies heartburn or dysphagia.  She says she generally has 1-2 bowel movements per day. She has a history of Crohn's disease.  She states she has not been on therapy since 2013. Patient is alert and in no acute distress. Face is somewhat puffy. Oropharyngeal mucosa is normal Neck without masses or thyromegaly. Cardiac exam with regular rhythm normal S1 and S2.  No murmur gallop noted Auscultation lungs reveal vesicular breath sounds bilaterally Abdomen is full.  She has infraumbilical scar.  On palpation abdomen is soft.  She has mild generalized tenderness which is more pronounced in epigastric region.  No organomegaly or masses She has edema involving back of her hands as well as feet and legs.   Results of outside CT noted; study from 01/17/2021.  Generalized subcutaneous edema involving abdominal wall.  Distal esophageal wall thickening.  Small of contrast noted in distal esophagus.  No wall thickening to small or large bowel.  Increased size of inguinal nodes bilaterally.  Lab data from from 01/31/2021 WBC 5.0 H&H 9.9 and 30.2 MCV 90.7 Platelet count 329K Serum albumin 1.9  Assessment.  Patient is 25 year old female with juvenile diabetes history of pancreatitis and gastroparesis now being evaluated for abdominal pain and abnormal CT revealing thickening to distal esophageal wall.  She has history of Crohn's disease but CT does not show any abnormality to small or large bowel. She also has anemia which most likely due to chronic  disease. Suspect esophagitis secondary to gastroparesis.  It remains to be seen if Crohn's disease is active or if she is in remission. Anasarca would appear to be due to hypoalbuminemia.  Urinalysis revealed 100 mg/dL of protein.  Doubt nephrotic syndrome but should consider 24-hour urine collection for protein.  Will proceed with diagnostic esophagogastroduodenoscopy and colonoscopy. Reviewed both the procedures with the patient and she is agreeable.

## 2021-02-01 NOTE — Op Note (Signed)
[facility] Patient Name: Diamond [name] Procedure Date: 02/01/2021 1:10 PM MRN: [id number] Date of Birth: [birth year] Attending MD: Hildred Laser , MD CSN: [id number] Age: 25 Admit Type: Inpatient Procedure:                Colonoscopy Indications:              Abdominal pain, Crohn's disease of the colon,                            Disease activity assessment of Crohn's disease of                            the colon Providers:                Hildred Laser, MD, Lurline Del, RN, Randa Spike,                            Technician Referring MD:             Barton Dubois, MD Medicines:                Propofol per Anesthesia Complications:            No immediate complications. Estimated Blood Loss:     Estimated blood loss: none. Procedure:                Pre-Anesthesia Assessment:                           - Prior to the procedure, a History and Physical                            was performed, and patient medications and                            allergies were reviewed. The patient's tolerance of                            previous anesthesia was also reviewed. The risks                            and benefits of the procedure and the sedation                            options and risks were discussed with the patient.                            All questions were answered, and informed consent                            was obtained. Prior Anticoagulants: The patient                            last took Lovenox (enoxaparin) 1 day prior to the  procedure. ASA Grade Assessment: IV - A patient                            with severe systemic disease that is a constant                            threat to life. After reviewing the risks and                            benefits, the patient was deemed in satisfactory                            condition to undergo the procedure.                           After obtaining informed consent, the colonoscope                             was passed under direct vision. Throughout the                            procedure, the patient's blood pressure, pulse, and                            oxygen saturations were monitored continuously. The                            [id number]) was introduced through the anus                            and advanced to the the cecum, identified by the                            ileocecal valve. The colonoscopy was performed                            without difficulty. The patient tolerated the                            procedure well. The quality of the bowel                            preparation was inadequate. The ileocecal valve and                            the rectum were photographed. Scope In: 1:11:32 PM Scope Out: 1:20:59 PM Scope Withdrawal Time: 0 hours 2 minutes 0 seconds  Total Procedure Duration: 0 hours 9 minutes 27 seconds  Findings:      The perianal and digital rectal examinations were normal.      The colon (entire examined portion) was examined. Limited view of mucosa       due to poor prep. Part of the mucosa that was seen was normal.      Retroflexion in the rectum was not  performed. Impression:               - Preparation of the colon was inadequate.                           - Limited view of colonic mucosa. Mucosa there was                            seen was normal                           - No specimens collected. Moderate Sedation:      Per Anesthesia Care Recommendation:           - Return patient to hospital ward for ongoing care.                           - Clear liquid diet today.                           - Continue present medications.                           - Repeat colonoscopy tomorrow. Procedure Code(s):        --- Professional ---                           [phone], Colonoscopy, flexible; diagnostic, including                            collection of specimen(s) by brushing or washing,                             when performed (separate procedure) Diagnosis Code(s):        --- Professional ---                           R10.9, Unspecified abdominal pain                           K50.10, Crohn's disease of large intestine without                            complications CPT copyright 2019 American [employer] Association. All rights reserved. The codes documented in this report are preliminary and upon coder review may  be revised to meet current compliance requirements. Hildred Laser, MD Hildred Laser, MD 02/01/2021 1:36:42 PM This report has been signed electronically. Number of Addenda: 0

## 2021-02-01 NOTE — Progress Notes (Signed)
Initially patient reported that her face "feels swollen." Patient is new to myself, and the nurse, but no appreciable swelling. No change in O2 requirement. ACE is still being held. Ordered Benadryl. Then patient complained of chest pain. No worsening dyspnea, nauseous at baseline. EKG shows sinus tach. Trop and nitro ordered.

## 2021-02-01 NOTE — Progress Notes (Signed)
Inpatient Diabetes Program Recommendations  AACE/ADA: New Consensus Statement on Inpatient Glycemic Control (2015)  Target Ranges:  Prepandial:   less than 140 mg/dL      Peak postprandial:   less than 180 mg/dL (1-2 hours)      Critically ill patients:  140 - 180 mg/dL   Lab Results  Component Value Date   GLUCAP 58 (L) 02/01/2021   HGBA1C 14.3 (H) 01/30/2021    Review of Glycemic Control Results for ARISBETH, PURRINGTON (MRN [id number]) as of 02/01/2021 09:22  Ref. Range 01/31/2021 07:41 01/31/2021 11:57 01/31/2021 17:13 01/31/2021 18:34 01/31/2021 20:21 02/01/2021 05:51 02/01/2021 06:16 02/01/2021 07:07 02/01/2021 08:49  Glucose-Capillary Latest Ref Range: 70 - 99 mg/dL 125 (H) 122 (H) 62 (L) 108 (H) 135 (H) 44 (LL) 81 55 (L) 58 (L)  Diabetes history:DM 1 Outpatient Diabetes medications: Lantus 35 units daily, Humalog 10-16 units tid with meals Current orders for Inpatient glycemic control: Lantus 30 units daily, Novolog sensitive tid with meals and HS  Inpatient Diabetes Program Recommendations:   Note low CBG's this AM.  Patient prepping for colonoscopy/EGD today. Please consider adding Dextrose to IV fluids.  Also reduce Lantus to 22 units daily.   Thanks  Adah Perl, RN, BC-ADM Inpatient Diabetes Coordinator Pager [phone] (8a-5p)

## 2021-02-01 NOTE — Progress Notes (Signed)
PROGRESS NOTE  Diamond [name] RWE:[id number] DOB: 12/31/1995 DOA: 01/30/2021 PCP: [name], PA-C  Brief History:  25 y.o. female with medical history of DM type 1, bipolar disorder, anasarca, Crohn's Disease,and hypertension presenting with high blood sugars. The patient was recently discharged from the hospital after a stay from 01/23/21 to 2/15/22for treatment of DKA. The patient was discharged home with instructions to get Lantus from the free clinic. Apparently, the patient never picked up the Lantus. The patient gives often conflicting information regarding her insulin usage. Essentially, it appears that the patient has not taken any long-acting insulin since her discharge on 01/27/2021. She states that she may have intermittently taken some short acting insulin over the weekend after which she was discharged. Nevertheless, it has been well documented regarding the patient's poor compliance with her medications. She has had numerous admissions in the hospital for her DKA and uncontrolled hyperglycemia. The patient states that she came to the hospital forpreop evaluation for her EGD and colonoscopy scheduled for 01/31/21.Apparently, the patient was noted to have a CBG reading of "high". As result, the patient was sent to emergency department for further evaluation. The patient endorses polydipsia and polyuria. She states that she has had nausea and vomiting for the past 2 days at home. She denies any fevers, chills, headache, sore throat. She has chronic intermittent chest discomfort and dyspnea on exertion. She denies any diarrhea, hematochezia, melena. She states that she has intermittent abdominal pain. In the emergency department, the patient was afebrile and hemodynamically stable with heart rate 110-120. Oxygen saturation was 90-100% on room air. BMP showed a sodium 131, potassium 4.4, serum creatinine 0.0. Serum glucose was 753. Anion gap was 10. WBC 5.3,  hemoglobin 12.7, platelets 286,000. Chest x-ray was negative for any infiltrates or edema. VBG showed 7.347/47/35/22. The patient was started on intravenous insulin and IV fluids.  Assessment/Plan:  Hyperosmolar state -patient started on IV insulin with q 1 hour CBG check and q 4 hour BMPs -pt started on aggressive fluid resuscitation -Electrolytes were monitored and repleted appropriately. -transitioned to [place] insulin once anion gap closed and CBGs stabilized. -HbA1C>15.5 on 08/25/20 -Currently n.p.o. in anticipation for endoscopy and colonoscopy -Patient had experience ongoing hypoglycemic events that have been resistant to cooperate with the use of D50 amp -Low rate dextrose infusion initiated -Every 4 hours CBG monitoring.  Crohn's disease -Apparently, patient is scheduled for colonoscopy and EGD on 01/31/2021 -Consult GI-->plans colonoscopy and endoscopy later today -We will follow results and recommendations.  Throat Swelling sensation?? -personally visualized-->she has enlarged tonsils and large uvula, but NO tongue or lip swelling -no SOB or dysphagia -Lisinopril has been placed on hold -Will use ARB at time of discharge.  Sinus tachycardia -Secondary to volume depletion and acute medical illness -personally reviewed EKg--sinus tach, no STT changes -Continue to monitor on telemetry. -As mentioned below, continue holding diuretics.  Atypical chest pain -Likely secondary to the patient's vomiting -Cycle troponins 12>>11 -Chest x-ray without any infiltrates or edema -Continue treatment with PPI. -No chest pain reported today.  Anasarca -Secondary to hypoalbuminemia and proteinuria -The patient had 3.95 g ofprotein on urine protein creatinine ratio on 10/18/2020 -Restart ARB at discharge. -Holding furosemide with concern for volume depletion especially while NPO. -Resume adjusted dose of Lasix at time of discharge. -10/19/2020 echo EF 65 to 70%, no WMA, trivial  MR  Bipolar disorder -Continue trazodone and Celexa -Stable mood.  Hyponatremia -Secondary to hyperglycemia -resolved  and is stable currently -Continue to follow electrolytes. -Maintain adequate hydration.  Essential hypertension -Continue to follow vital signs. -Stable blood pressure.  Status is: Inpatient  Remains inpatient appropriate because:IV treatments appropriate due to intensity of illness or inability to take PO   Dispo: The patient is from: Home              Anticipated d/c is to: Home              Patient currently is not medically stable to d/c.   Difficult to place patient No   Family Communication:  no Family at bedside  Consultants:  GI  Code Status:  FULL   DVT Prophylaxis:  [place] Lovenox   Procedures: As Listed in Progress Note Above  Antibiotics: None   Subjective: Patient expressing no vomiting, no chest pain, no fever, no dysuria.  Expressed to be slightly hungry and complaining of mild intermittent nausea.  Hypoglycemic event has been reported.  Objective: Vitals:   02/01/21 1335 02/01/21 1345 02/01/21 1400 02/01/21 1632  BP: (!) 141/109 (!) 148/110 132/68   Pulse: 98 (!) 104 (!) 114 (!) 117  Resp: (!) 26 13 10 17   Temp: 98.1 F (36.7 C)   99 F (37.2 C)  TempSrc:    Oral  SpO2: 100% 99% 99% 99%  Weight:      Height:        Intake/Output Summary (Last 24 hours) at 02/01/2021 1821 Last data filed at 02/01/2021 1323 Gross per 24 hour  Intake 209.28 ml  Output 0 ml  Net 209.28 ml   Weight change: -0.4 kg  Exam: General exam: Alert, awake, oriented x 3; following commands appropriately; in no major distress.  She is afebrile reporting some nausea without acute vomiting. Respiratory system: Clear to auscultation. Respiratory effort normal.  No using accessory muscle.  Good saturation on room air. Cardiovascular system: Sinus tachycardia; no rubs, no gallops, no JVD. Gastrointestinal system: Abdomen is nondistended, soft and  nontender. No organomegaly or masses felt. Normal bowel sounds heard. Central nervous system: Alert and oriented. No focal neurological deficits. Extremities: No cyanosis or clubbing. Skin: No petechiae. Psychiatry: Judgement and insight appear normal. Mood & affect appropriate.    Data Reviewed: I have personally reviewed following labs and imaging studies  Basic Metabolic Panel: Recent Labs  Lab 01/26/21 0420 01/27/21 0536 01/30/21 0907 01/30/21 1006 01/30/21 1424 01/31/21 0344 02/01/21 0436  NA 137 132* 134* 131* 144 139 135  K 4.5 4.2 4.5 4.4 3.7 3.9 3.8  CL 102 98 97* 96* 106 104 101  CO2 27 29 24 25 27 27 27   GLUCOSE 196* 257* 710* 753* 159* 71 83  BUN 14 19 13 12 12 10 6   CREATININE 0.71 0.88 0.78 0.80 0.70 0.60 0.64  CALCIUM 8.4* 8.3* 8.7* 8.8* 9.0 8.3* 8.2*  MG 1.9 1.8  --   --   --   --   --    Liver Function Tests: Recent Labs  Lab 01/30/21 1919 01/31/21 0344 02/01/21 0436  AST 22 27 20   ALT 25 23 18   ALKPHOS 81 69 60  BILITOT 0.6 0.8 1.1  PROT 5.8* 5.7* 5.6*  ALBUMIN 2.1* 1.9* 2.4*   CBC: Recent Labs  Lab 01/26/21 0420 01/30/21 1006 01/31/21 0344  WBC 6.7 5.3 5.0  NEUTROABS  --  3.3  --   HGB 11.1* 12.7 9.9*  HCT 33.8* 39.5 30.2*  MCV 90.4 93.2 90.7  PLT 335 386 329  CBG: Recent Labs  Lab 02/01/21 1126 02/01/21 1226 02/01/21 1334 02/01/21 1454 02/01/21 1630  GLUCAP 68* 101* 75 46* 93   HbA1C: Recent Labs    01/30/21 1414  HGBA1C 14.3*   Urine analysis:    Component Value Date/Time   COLORURINE STRAW (A) 01/30/2021 0956   APPEARANCEUR CLEAR 01/30/2021 0956   APPEARANCEUR Clear 11/15/2020 1531   LABSPEC 1.008 01/30/2021 0956   PHURINE 6.0 01/30/2021 0956   GLUCOSEU >=500 (A) 01/30/2021 0956   HGBUR SMALL (A) 01/30/2021 0956   BILIRUBINUR NEGATIVE 01/30/2021 0956   BILIRUBINUR NEG 12/05/2020 1353   BILIRUBINUR Negative 11/15/2020 1531   KETONESUR NEGATIVE 01/30/2021 0956   PROTEINUR 100 (A) 01/30/2021 0956   UROBILINOGEN  0.2 12/05/2020 1353   UROBILINOGEN 0.2 09/13/2012 2014   NITRITE NEGATIVE 01/30/2021 0956   LEUKOCYTESUR NEGATIVE 01/30/2021 0956    Recent Results (from the past 240 hour(s))  Resp Panel by RT-PCR (Flu A&B, Covid) Nasopharyngeal Swab     Status: None   Collection Time: 01/23/21  7:26 PM   Specimen: Nasopharyngeal Swab; Nasopharyngeal(NP) swabs in vial transport medium  Result Value Ref Range Status   SARS Coronavirus 2 by RT PCR NEGATIVE NEGATIVE Final    Comment: (NOTE) SARS-CoV-2 target nucleic acids are NOT DETECTED.  The SARS-CoV-2 RNA is generally detectable in upper respiratory specimens during the acute phase of infection. The lowest concentration of SARS-CoV-2 viral copies this assay can detect is 138 copies/mL. A negative result does not preclude SARS-Cov-2 infection and should not be used as the sole basis for treatment or other patient management decisions. A negative result may occur with  improper specimen collection/handling, submission of specimen other than nasopharyngeal swab, presence of viral mutation(s) within the areas targeted by this assay, and inadequate number of viral copies(<138 copies/mL). A negative result must be combined with clinical observations, patient history, and epidemiological information. The expected result is Negative.  Fact Sheet for Patients:  EntrepreneurPulse.com.au  Fact Sheet for Healthcare Providers:  IncredibleEmployment.be  This test is no t yet approved or cleared by the [place] FDA and  has been authorized for detection and/or diagnosis of SARS-CoV-2 by FDA under an Emergency Use Authorization (EUA). This EUA will remain  in effect (meaning this test can be used) for the duration of the COVID-19 declaration under Section 564(b)(1) of the Act, 21 U.S.C.section 360bbb-3(b)(1), unless the authorization is terminated  or revoked sooner.       Influenza A by PCR NEGATIVE NEGATIVE  Final   Influenza B by PCR NEGATIVE NEGATIVE Final    Comment: (NOTE) The Xpert Xpress SARS-CoV-2/FLU/RSV plus assay is intended as an aid in the diagnosis of influenza from Nasopharyngeal swab specimens and should not be used as a sole basis for treatment. Nasal washings and aspirates are unacceptable for Xpert Xpress SARS-CoV-2/FLU/RSV testing.  Fact Sheet for Patients: EntrepreneurPulse.com.au  Fact Sheet for Healthcare Providers: IncredibleEmployment.be  This test is not yet approved or cleared by the [place] FDA and has been authorized for detection and/or diagnosis of SARS-CoV-2 by FDA under an Emergency Use Authorization (EUA). This EUA will remain in effect (meaning this test can be used) for the duration of the COVID-19 declaration under Section 564(b)(1) of the Act, 21 U.S.C. section 360bbb-3(b)(1), unless the authorization is terminated or revoked.  Performed at [facility], [address] [place], [place] [address]   MRSA PCR Screening     Status: None   Collection Time: 01/23/21 10:49 PM   Specimen:  Nasal Mucosa; Nasopharyngeal  Result Value Ref Range Status   MRSA by PCR NEGATIVE NEGATIVE Final    Comment:        The GeneXpert MRSA Assay (FDA approved for NASAL specimens only), is one component of a comprehensive MRSA colonization surveillance program. It is not intended to diagnose MRSA infection nor to guide or monitor treatment for MRSA infections. Performed at [facility], [address] [place], [place] [address]   Resp Panel by RT-PCR (Flu A&B, Covid) Nasopharyngeal Swab     Status: None   Collection Time: 01/30/21 10:29 AM   Specimen: Nasopharyngeal Swab; Nasopharyngeal(NP) swabs in vial transport medium  Result Value Ref Range Status   SARS Coronavirus 2 by RT PCR NEGATIVE NEGATIVE Final    Comment: (NOTE) SARS-CoV-2 target nucleic acids are NOT DETECTED.  The SARS-CoV-2 RNA is generally detectable in  upper respiratory specimens during the acute phase of infection. The lowest concentration of SARS-CoV-2 viral copies this assay can detect is 138 copies/mL. A negative result does not preclude SARS-Cov-2 infection and should not be used as the sole basis for treatment or other patient management decisions. A negative result may occur with  improper specimen collection/handling, submission of specimen other than nasopharyngeal swab, presence of viral mutation(s) within the areas targeted by this assay, and inadequate number of viral copies(<138 copies/mL). A negative result must be combined with clinical observations, patient history, and epidemiological information. The expected result is Negative.  Fact Sheet for Patients:  EntrepreneurPulse.com.au  Fact Sheet for Healthcare Providers:  IncredibleEmployment.be  This test is no t yet approved or cleared by the [place] FDA and  has been authorized for detection and/or diagnosis of SARS-CoV-2 by FDA under an Emergency Use Authorization (EUA). This EUA will remain  in effect (meaning this test can be used) for the duration of the COVID-19 declaration under Section 564(b)(1) of the Act, 21 U.S.C.section 360bbb-3(b)(1), unless the authorization is terminated  or revoked sooner.       Influenza A by PCR NEGATIVE NEGATIVE Final   Influenza B by PCR NEGATIVE NEGATIVE Final    Comment: (NOTE) The Xpert Xpress SARS-CoV-2/FLU/RSV plus assay is intended as an aid in the diagnosis of influenza from Nasopharyngeal swab specimens and should not be used as a sole basis for treatment. Nasal washings and aspirates are unacceptable for Xpert Xpress SARS-CoV-2/FLU/RSV testing.  Fact Sheet for Patients: EntrepreneurPulse.com.au  Fact Sheet for Healthcare Providers: IncredibleEmployment.be  This test is not yet approved or cleared by the [place] FDA and has been  authorized for detection and/or diagnosis of SARS-CoV-2 by FDA under an Emergency Use Authorization (EUA). This EUA will remain in effect (meaning this test can be used) for the duration of the COVID-19 declaration under Section 564(b)(1) of the Act, 21 U.S.C. section 360bbb-3(b)(1), unless the authorization is terminated or revoked.  Performed at [facility], [address] [place], [place] [address]      Scheduled Meds: . alfuzosin  10 mg Oral Q breakfast  . Chlorhexidine Gluconate Cloth  6 each Topical Daily  . citalopram  10 mg Oral Daily  . [START ON 02/02/2021] enoxaparin (LOVENOX) injection  40 mg Subcutaneous Q24H  . gabapentin  100 mg Oral Daily  . insulin aspart  0-5 Units Subcutaneous QHS  . insulin aspart  0-9 Units Subcutaneous TID WC  . insulin glargine  7 Units Subcutaneous QHS  . ondansetron  4 mg Oral TID AC & HS  . pantoprazole  40 mg Oral  Daily  . tamsulosin  0.4 mg Oral Daily  . traZODone  50 mg Oral QHS   Continuous Infusions: . dextrose 5% lactated ringers 50 mL/hr at 02/01/21 1550    Procedures/Studies: US Venous Img Lower Bilateral  Result Date: 01/24/2021 CLINICAL DATA:  Shortness of breath. Bilateral lower extremity swelling. EXAM: BILATERAL LOWER EXTREMITY VENOUS DOPPLER ULTRASOUND TECHNIQUE: Gray-scale sonography with compression, as well as color and duplex ultrasound, were performed to evaluate the deep venous system(s) from the level of the common femoral vein through the popliteal and proximal calf veins. COMPARISON:  No prior FINDINGS: VENOUS Normal compressibility of the common femoral, superficial femoral, and popliteal veins, as well as the visualized calf veins. Visualized portions of profunda femoral vein and great saphenous vein unremarkable. No filling defects to suggest DVT on grayscale or color Doppler imaging. Doppler waveforms show normal direction of venous flow, normal respiratory plasticity and response to augmentation. Limited views of  the contralateral common femoral vein are unremarkable. OTHER Prominent bilateral inguinal lymph nodes noted. The largest is in right groin and measures 2.9 cm. Bilateral lower extremity edema noted. IMPRESSION: 1.  No evidence of DVT. 2. Prominent bilateral inguinal lymph nodes noted. The largest is in the right groin and measures 2.9 cm. Bilateral lower extremity edema noted. Electronically Signed   By: Marcello Moores  Register   On: 01/24/2021 10:35   DG Chest Portable 1 View  Result Date: 01/30/2021 CLINICAL DATA:  Lower extremity edema. Chest pain and elevated blood sugar. EXAM: PORTABLE CHEST 1 VIEW COMPARISON:  01/23/2021 FINDINGS: 1017 hours. The lungs are clear without focal pneumonia, edema, pneumothorax or pleural effusion. The cardiopericardial silhouette is within normal limits for size. The visualized bony structures of the thorax show no acute abnormality. IMPRESSION: No active disease. Electronically Signed   By: Misty Stanley M.D.   On: 01/30/2021 10:28   DG Chest Port 1 View  Result Date: 01/23/2021 CLINICAL DATA:  Shortness of breath with lower extremity edema EXAM: PORTABLE CHEST 1 VIEW COMPARISON:  Chest radiograph January 17, 2021; CT angiogram chest January 17, 2021 FINDINGS: Lungs are clear. Heart size and pulmonary vascularity are normal. No adenopathy. No pneumothorax. No bone lesions. IMPRESSION: Lungs clear.  Cardiac silhouette normal. Electronically Signed   By: Lowella Grip III M.D.   On: 01/23/2021 19:26    Barton Dubois, MD  [facility]  If 7PM-7AM, please contact night-coverage [email or web address] Password [email or web address] 02/01/2021, 6:21 PM   LOS: 1 day

## 2021-02-01 NOTE — Transfer of Care (Signed)
Immediate Anesthesia Transfer of Care Note  Patient: Diamond [name]  Procedure(s) Performed: ESOPHAGOGASTRODUODENOSCOPY (EGD) WITH PROPOFOL (N/A ) COLONOSCOPY WITH PROPOFOL (N/A ) BIOPSY  Patient Location: PACU  Anesthesia Type:General  Level of Consciousness: awake  Airway & Oxygen Therapy: Patient Spontanous Breathing  Post-op Assessment: Report given to RN  Post vital signs: Reviewed and stable  Last Vitals:  Vitals Value Taken Time  BP 141/109 02/01/21 1335  Temp 36.7 C 02/01/21 1335  Pulse 99 02/01/21 1337  Resp 21 02/01/21 1337  SpO2 100 % 02/01/21 1337  Vitals shown include unvalidated device data.  Last Pain:  Vitals:   02/01/21 1235  TempSrc: Oral  PainSc: 7       Patients Stated Pain Goal: 7 (67/73/73 6681)  Complications: No complications documented.

## 2021-02-01 NOTE — Progress Notes (Signed)
Patient is agreeable to proceed with colonoscopy tomorrow after end of the prep today with mag citrate to be followed by Dulcolax tablets. Patient was given 10 ounces of mag citrate earlier today. She is on IV fluids keep her hydrated. Will assess response to mag citrate before Dulcolax tablets ordered.

## 2021-02-02 ENCOUNTER — Encounter (HOSPITAL_COMMUNITY)
Admission: EM | Disposition: A | Discharge status: Home / Self Care | Payer: Self-pay | Source: Home / Self Care | Attending: Internal Medicine

## 2021-02-02 ENCOUNTER — Inpatient Hospital Stay (HOSPITAL_COMMUNITY): Payer: Self-pay | Admitting: Anesthesiology

## 2021-02-02 ENCOUNTER — Encounter (HOSPITAL_COMMUNITY): Payer: Self-pay | Admitting: Internal Medicine

## 2021-02-02 ENCOUNTER — Inpatient Hospital Stay (HOSPITAL_COMMUNITY): Payer: Self-pay

## 2021-02-02 DIAGNOSIS — R1013 Epigastric pain: Secondary | ICD-10-CM

## 2021-02-02 DIAGNOSIS — R601 Generalized edema: Secondary | ICD-10-CM

## 2021-02-02 DIAGNOSIS — K5090 Crohn's disease, unspecified, without complications: Secondary | ICD-10-CM

## 2021-02-02 LAB — URINALYSIS, ROUTINE W REFLEX MICROSCOPIC
Bilirubin Urine: NEGATIVE
Glucose, UA: 50 mg/dL — AB
Ketones, ur: NEGATIVE mg/dL
Leukocytes,Ua: NEGATIVE
Nitrite: NEGATIVE
Protein, ur: 100 mg/dL — AB
Specific Gravity, Urine: 1.008 (ref 1.005–1.030)
pH: 6 (ref 5.0–8.0)

## 2021-02-02 LAB — BASIC METABOLIC PANEL
Anion gap: 9 (ref 5–15)
BUN: <5 mg/dL — ABNORMAL LOW (ref 6–20)
CO2: 26 mmol/L (ref 22–32)
Calcium: 8.3 mg/dL — ABNORMAL LOW (ref 8.9–10.3)
Chloride: 100 mmol/L (ref 98–111)
Creatinine, Ser: 0.72 mg/dL (ref 0.44–1.00)
GFR, Estimated: >60 mL/min (ref >60)
Glucose, Bld: 126 mg/dL — ABNORMAL HIGH (ref 70–99)
Potassium: 4.3 mmol/L (ref 3.5–5.1)
Sodium: 135 mmol/L (ref 135–145)

## 2021-02-02 LAB — GLUCOSE, CAPILLARY
Glucose-Capillary: 135 mg/dL — ABNORMAL HIGH (ref 70–99)
Glucose-Capillary: 128 mg/dL — ABNORMAL HIGH (ref 70–99)
Glucose-Capillary: 115 mg/dL — ABNORMAL HIGH (ref 70–99)
Glucose-Capillary: 126 mg/dL — ABNORMAL HIGH (ref 70–99)
Glucose-Capillary: 129 mg/dL — ABNORMAL HIGH (ref 70–99)
Glucose-Capillary: 186 mg/dL — ABNORMAL HIGH (ref 70–99)
Glucose-Capillary: 197 mg/dL — ABNORMAL HIGH (ref 70–99)

## 2021-02-02 LAB — SURGICAL PATHOLOGY

## 2021-02-02 SURGERY — COLONOSCOPY WITH PROPOFOL
Anesthesia: Monitor Anesthesia Care

## 2021-02-02 MED ORDER — FLEET ENEMA 7-19 GM/118ML RE ENEM
1.0000 | ENEMA | RECTAL | Status: AC
  Administered 2021-02-02: 1 via RECTAL

## 2021-02-02 MED ORDER — ACETAMINOPHEN 325 MG PO TABS
650.0000 mg | ORAL_TABLET | Freq: Four times a day (QID) | ORAL | Status: DC | PRN
  Administered 2021-02-02: 650 mg via ORAL
  Filled 2021-02-02: qty 2

## 2021-02-02 MED ORDER — POLYETHYLENE GLYCOL 3350 17 G PO PACK
17.0000 g | PACK | ORAL | Status: AC
  Administered 2021-02-02 (×6): 17 g via ORAL
  Filled 2021-02-02 (×2): qty 1

## 2021-02-02 MED ORDER — DIPHENHYDRAMINE HCL 50 MG/ML IJ SOLN
25.0000 mg | Freq: Once | INTRAMUSCULAR | Status: AC
  Administered 2021-02-02: 25 mg via INTRAVENOUS
  Filled 2021-02-02: qty 1

## 2021-02-02 MED ORDER — BISACODYL 5 MG PO TBEC
10.0000 mg | DELAYED_RELEASE_TABLET | Freq: Once | ORAL | Status: AC
  Administered 2021-02-02: 10 mg via ORAL
  Filled 2021-02-02: qty 2

## 2021-02-02 MED ORDER — FUROSEMIDE 10 MG/ML IJ SOLN
40.0000 mg | Freq: Every day | INTRAMUSCULAR | Status: DC
  Administered 2021-02-02: 40 mg via INTRAVENOUS
  Filled 2021-02-02: qty 4

## 2021-02-02 MED ORDER — BISACODYL 5 MG PO TBEC
10.0000 mg | DELAYED_RELEASE_TABLET | ORAL | Status: AC
  Administered 2021-02-02: 10 mg via ORAL

## 2021-02-02 MED ORDER — FLEET ENEMA 7-19 GM/118ML RE ENEM
1.0000 | ENEMA | Freq: Once | RECTAL | Status: AC
  Administered 2021-02-02: 1 via RECTAL

## 2021-02-02 NOTE — Progress Notes (Signed)
Patient complained of chest pain, baseline nausea, denies any numbness or tingling, shortness or difficulty breathing. EKG done. MD made aware. Nitro given as ordered with good effect.

## 2021-02-02 NOTE — Progress Notes (Signed)
   02/02/21 1829  Assess: MEWS Score  BP 115/76  Pulse Rate (!) 104  Resp 18  SpO2 100 %  O2 Device Room Air  Assess: MEWS Score  MEWS Temp 0  MEWS Systolic 0  MEWS Pulse 1  MEWS RR 0  MEWS LOC 0  MEWS Score 1  MEWS Score Color Green  Assess: if the MEWS score is Yellow or Red  Were vital signs taken at a resting state? Yes  Focused Assessment No change from prior assessment  Early Detection of Sepsis Score *See Row Information* Low  MEWS guidelines implemented *See Row Information* No, previously yellow, continue vital signs every 4 hours  Document  Patient Outcome Other (Comment) (Stable and remains on the department)  Progress note created (see row info) Yes

## 2021-02-02 NOTE — Progress Notes (Addendum)
Subjective:  Feels ok. No chest pain today. Throat is a little scratchy but does not feel swollen today. Worried about swelling in the legs/arms/face. Some epigastric pain.   Objective: Vital signs in last 24 hours: Temp:  [98.1 F (36.7 C)-101.1 F (38.4 C)] 99.4 F (37.4 C) (03/03 0739) Pulse Rate:  [98-119] 109 (03/03 0739) Resp:  [8-26] 18 (03/03 0739) BP: (117-189)/(68-129) 156/109 (03/03 0739) SpO2:  [90 %-100 %] 100 % (03/03 0739) Last BM Date: 02/01/21 General:   Alert,  Well-developed, well-nourished, pleasant and cooperative in NAD Head:  Normocephalic and atraumatic. Eyes:  Sclera clear, no icterus.  Mouth: posterior pharynx normal without erythema or exudate Chest: CTA bilaterally without rales, rhonchi, crackles.    Heart:  Regular rate and rhythm; no murmurs, clicks, rubs,  or gallops. Abdomen:  Soft, nontender and nondistended. No masses, hepatosplenomegaly or hernias noted. Normal bowel sounds, without guarding, and without rebound.   Extremities:  Without clubbing, deformity or edema. Neurologic:  Alert and  oriented x4;  grossly normal neurologically. Skin:  Intact without significant lesions or rashes. Psych:  Alert and cooperative. Normal mood and affect.  Intake/Output from previous day: 03/02 0701 - 03/03 0700 In: 200 [I.V.:200] Out: 2 [Urine:1; Stool:1] Intake/Output this shift: No intake/output data recorded.  Lab Results: CBC Recent Labs    01/31/21 0344  WBC 5.0  HGB 9.9*  HCT 30.2*  MCV 90.7  PLT 329   BMET Recent Labs    01/31/21 0344 02/01/21 0436 02/02/21 0518  NA 139 135 135  K 3.9 3.8 4.3  CL 104 101 100  CO2 27 27 26   GLUCOSE 71 83 126*  BUN 10 6 <5*  CREATININE 0.60 0.64 0.72  CALCIUM 8.3* 8.2* 8.3*   LFTs Recent Labs    01/30/21 1919 01/31/21 0344 02/01/21 0436  BILITOT 0.6 0.8 1.1  BILIDIR <0.1 0.2  --   IBILI NOT CALCULATED 0.6  --   ALKPHOS 81 69 60  AST 22 27 20   ALT 25 23 18   PROT 5.8* 5.7* 5.6*  ALBUMIN  2.1* 1.9* 2.4*   No results for input(s): LIPASE in the last 72 hours. PT/INR No results for input(s): LABPROT, INR in the last 72 hours.    Imaging Studies: DG Abd 1 View  Result Date: 02/01/2021 CLINICAL DATA:  Abdominal distension EXAM: ABDOMEN - 1 VIEW COMPARISON:  None. FINDINGS: The bowel gas pattern is normal. No radio-opaque calculi or other significant radiographic abnormality are seen. IMPRESSION: Negative. Electronically Signed   By: Fidela Salisbury MD   On: 02/01/2021 21:58   US Venous Img Lower Bilateral  Result Date: 01/24/2021 CLINICAL DATA:  Shortness of breath. Bilateral lower extremity swelling. EXAM: BILATERAL LOWER EXTREMITY VENOUS DOPPLER ULTRASOUND TECHNIQUE: Gray-scale sonography with compression, as well as color and duplex ultrasound, were performed to evaluate the deep venous system(s) from the level of the common femoral vein through the popliteal and proximal calf veins. COMPARISON:  No prior FINDINGS: VENOUS Normal compressibility of the common femoral, superficial femoral, and popliteal veins, as well as the visualized calf veins. Visualized portions of profunda femoral vein and great saphenous vein unremarkable. No filling defects to suggest DVT on grayscale or color Doppler imaging. Doppler waveforms show normal direction of venous flow, normal respiratory plasticity and response to augmentation. Limited views of the contralateral common femoral vein are unremarkable. OTHER Prominent bilateral inguinal lymph nodes noted. The largest is in right groin and measures 2.9 cm. Bilateral lower extremity edema noted. IMPRESSION:  1.  No evidence of DVT. 2. Prominent bilateral inguinal lymph nodes noted. The largest is in the right groin and measures 2.9 cm. Bilateral lower extremity edema noted. Electronically Signed   By: Marcello Moores  Register   On: 01/24/2021 10:35   DG Chest Portable 1 View  Result Date: 01/30/2021 CLINICAL DATA:  Lower extremity edema. Chest pain and elevated  blood sugar. EXAM: PORTABLE CHEST 1 VIEW COMPARISON:  01/23/2021 FINDINGS: 1017 hours. The lungs are clear without focal pneumonia, edema, pneumothorax or pleural effusion. The cardiopericardial silhouette is within normal limits for size. The visualized bony structures of the thorax show no acute abnormality. IMPRESSION: No active disease. Electronically Signed   By: Misty Stanley M.D.   On: 01/30/2021 10:28   DG Chest Port 1 View  Result Date: 01/23/2021 CLINICAL DATA:  Shortness of breath with lower extremity edema EXAM: PORTABLE CHEST 1 VIEW COMPARISON:  Chest radiograph January 17, 2021; CT angiogram chest January 17, 2021 FINDINGS: Lungs are clear. Heart size and pulmonary vascularity are normal. No adenopathy. No pneumothorax. No bone lesions. IMPRESSION: Lungs clear.  Cardiac silhouette normal. Electronically Signed   By: Lowella Grip III M.D.   On: 01/23/2021 19:26  [2 weeks]   Assessment: 25 year old female with history of type 1 diabetes, pancreatitis, gastroparesis, peripheral neuropathy, prior stroke, anasarca, possible Crohn's with colonoscopy/EGD in 2011 at [facility] and prescribed Pentasa (however lost to follow-up and establish care with [facility] in January 2022 with plans for EGD/colonoscopy March 1).  Admitted with hyperosmolar state with initial blood glucose greater than 700.  Hyperglycemia has improved and insulin drip has been discontinued.   Epigastric pain: Admits to NSAID use at times, alternates with Tylenol.  Outside CT abdomen and pelvis with contrast January 17, 2021: Distended urinary bladder with questionable posterior bladder wall thickening, progressive generalized subcutaneous edema of body wall fat, third spacing, mild distal esophageal wall thickening with small amount of contrast in the distal esophagus, increased size of bilateral inguinal nodes.  Moderate stool in the colon.  EGD this admission showed longitudinally marked mucosal in the esophagus.  Possible  Crohn's disease: Suspected possible early IBD at time of colonoscopy in 2011 with [facility].  CT abdomen December 2021 with mild thickening of the TI, nonspecific.  Recent imaging without small bowel or colon abnormality.  Colonoscopy planned.  Chronic constipation: Started on Linzess this admission.  Patient reported feeling her throat was swollen, facial swelling, given new medication, Linzess was stopped although may not be the culprit.  She has evidence of anasarca/significant peripheral edema as well.  Gastroparesis: In the setting of uncontrolled diabetes.  Hemoglobin A1c greater than 15.5 in September.  Controlled with PPI and Zofran.  EGD this admission as outlined above.  Anemia: Hemoglobin 12.7 on admission, down to 9.9.  Likely in the setting of aggressive IV fluid hydration.  No overt GI bleeding.  Anasarca: Appears to be due to hypoalbuminemia.  Albumin 1.9.  Urine revealed 100 mg/dL of protein.  Provided IV albumin x1.  Query protein losses plus or minus malnutrition.  Significant lower extremity edema, upper extremity edema, facial edema.  As an outpatient she is on Lasix.  Lasix held on admission due volume depletion.  Peripheral edema has been an issue since August 2021.  Fever: Patient febrile last night with temp of 101.1.  Last night complained of chest pain, no shortness of breath.  Headache.  Scratchy throat with recent endoscopy and complaints of swelling sensation.  Denies problems swallowing.  Chest pain evaluated by attending, EKG with sinus tach, troponin normal.   Plan: 1. Follow-up pending path from EGD. 2. Continue PPI. 3. Discussed with Dr. Dyann Kief regarding fluid overload, fever.  Further evaluation needed prior to consideration of colonoscopy. 4. Colonoscopy later today or tomorrow depending on work-up of fever, bowel preparation.  This morning her prep appeared incomplete, still passing liquid colored stool.  Additional Dulcolax and Fleet enemas provided.  Continue  n.p.o. status until we have determine whether her procedure will be postponed for tomorrow.  Laureen Ochs. Bernarda Caffey [facility] [phone] 3/3/202211:36 AM  Attending note:  Patient was noted to have a bowel movement which contained formed stool elements as she was being retrieved for colonoscopy..  Obviously, still not prepped. Will need to persevere with additional prep (MiraLAX) and reassess her candidacy for the examination tomorrow morning.         LOS: 2 days

## 2021-02-02 NOTE — Progress Notes (Signed)
PROGRESS NOTE  Diamond [name] VHQ:[id number] DOB: [birth year] DOA: 01/30/2021 PCP: [name], PA-C  Brief History:  25 y.o. female with medical history of DM type 1, bipolar disorder, anasarca, Crohn's Disease,and hypertension presenting with high blood sugars. The patient was recently discharged from the hospital after a stay from 01/23/21 to 2/15/22for treatment of DKA. The patient was discharged home with instructions to get Lantus from the free clinic. Apparently, the patient never picked up the Lantus. The patient gives often conflicting information regarding her insulin usage. Essentially, it appears that the patient has not taken any long-acting insulin since her discharge on 01/27/2021. She states that she may have intermittently taken some short acting insulin over the weekend after which she was discharged. Nevertheless, it has been well documented regarding the patient's poor compliance with her medications. She has had numerous admissions in the hospital for her DKA and uncontrolled hyperglycemia. The patient states that she came to the hospital forpreop evaluation for her EGD and colonoscopy scheduled for 01/31/21.Apparently, the patient was noted to have a CBG reading of "high". As result, the patient was sent to emergency department for further evaluation. The patient endorses polydipsia and polyuria. She states that she has had nausea and vomiting for the past 2 days at home. She denies any fevers, chills, headache, sore throat. She has chronic intermittent chest discomfort and dyspnea on exertion. She denies any diarrhea, hematochezia, melena. She states that she has intermittent abdominal pain. In the emergency department, the patient was afebrile and hemodynamically stable with heart rate 110-120. Oxygen saturation was 90-100% on room air. BMP showed a sodium 131, potassium 4.4, serum creatinine 0.0. Serum glucose was 753. Anion gap was 10. WBC 5.3,  hemoglobin 12.7, platelets 286,000. Chest x-ray was negative for any infiltrates or edema. VBG showed 7.347/47/35/22. The patient was started on intravenous insulin and IV fluids.  Assessment/Plan:  Hyperosmolar state -patient started on IV insulin with q 1 hour CBG check and q 4 hour BMPs -pt started on aggressive fluid resuscitation -Electrolytes were monitored and repleted appropriately. -transitioned to Ferguson insulin once anion gap closed and CBGs stabilized. -HbA1C>15.5 on 08/25/20 -Receiving clear liquid diet only while completing bowel prep for colonoscopy. -No further episode of hypoglycemia currently. -Continue low rate dextrose infusion initiated -Continue close monitoring of patient's CBGs.  Every 4 hours CBG monitoring.  Crohn's disease -Appreciate GI consultation -Endoscopy from 3-22 without significant abnormalities -Planning for colonoscopy on 02/03/2021  Throat Swelling sensation?? -personally visualized-->she has enlarged tonsils and large uvula, but NO tongue or lip swelling -no SOB or dysphagia -Lisinopril has been placed on hold -Will use ARB at time of discharge.  Sinus tachycardia -Secondary to volume depletion and acute medical illness -personally reviewed EKg--sinus tach, no STT changes -Continue to monitor on telemetry. -Restart low-dose diuretics and follow response. -Close monitoring of electrolytes.  Atypical chest pain -Likely secondary to the patient's vomiting -Cycle troponins 12>>11 -Chest x-ray without any infiltrates or edema -Continue treatment with PPI. -Currently chest pain-free.  Anasarca -Secondary to hypoalbuminemia and proteinuria -The patient had 3.95 g ofprotein on urine protein creatinine ratio on 10/18/2020 -Restart ARB at discharge. -Resume adjusted dose of Lasix and follow daily weights. -10/19/2020 echo EF 65 to 70%, no WMA, trivial MR  Bipolar disorder -Continue trazodone and Celexa -Stable  mood.  Hyponatremia -Secondary to hyperglycemia -resolved and has remained stable currently -Continue to follow electrolytes. -Continue to maintain adequate hydration.  Essential hypertension -Continue  to follow vital signs. -Overall stable blood pressure.  Fever -no source of infection appreciated -will check CXR and UA -PRN analgesics ordered.  Status is: Inpatient  Remains inpatient appropriate because:IV treatments appropriate due to intensity of illness or inability to take PO   Dispo: The patient is from: Home              Anticipated d/c is to: Home              Patient currently is not medically stable to d/c.   Difficult to place patient No   Family Communication:  no Family at bedside  Consultants:  GI  Code Status:  FULL   DVT Prophylaxis:  [place] Lovenox   Procedures: As Listed in Progress Note Above  Antibiotics: None   Subjective: No chest pain, no nausea, no vomiting.  Spiking fever overnight.  Complaining of lower extremity swelling, hand swellings and face swelling.  No hypoglycemia appreciated.  Objective: Vitals:   02/02/21 0739 02/02/21 1129 02/02/21 1130 02/02/21 1434  BP: (!) 156/109  100/73 96/60  Pulse: (!) 109 91 (!) 109 (!) 109  Resp: 18  18 18   Temp: 99.4 F (37.4 C)  99.3 F (37.4 C) 99.6 F (37.6 C)  TempSrc: Oral  Oral Oral  SpO2: 100% 97% 100% 99%  Weight:      Height:        Intake/Output Summary (Last 24 hours) at 02/02/2021 1825 Last data filed at 02/02/2021 1738 Gross per 24 hour  Intake 1000 ml  Output 2 ml  Net 998 ml   Weight change:   Exam: General exam: Alert, awake, oriented x 3; denies chest pain, no shortness of breath, no coughing.  Patient complaining of lower extremity, hands and feet swelling.  She had experienced fever overnight.  No throat pain or difficulty swallowing reported. Respiratory system: Clear to auscultation. Respiratory effort normal.  No using accessory muscle.   Concentration. Cardiovascular system: Sinus tachycardia. No murmurs, rubs, gallops.  No JVD. Gastrointestinal system: Abdomen is nondistended, soft and nontender. No organomegaly or masses felt. Normal bowel sounds heard. Central nervous system: Alert and oriented. No focal neurological deficits. Extremities: No cyanosis or clubbing; trace to 1+ edema bilaterally appreciated on examination. Skin: No petechiae. Psychiatry: Judgement and insight appear normal. Mood & affect appropriate.    Data Reviewed: I have personally reviewed following labs and imaging studies  Basic Metabolic Panel: Recent Labs  Lab 01/27/21 0536 01/30/21 0907 01/30/21 1006 01/30/21 1424 01/31/21 0344 02/01/21 0436 02/02/21 0518  NA 132*   < > 131* 144 139 135 135  K 4.2   < > 4.4 3.7 3.9 3.8 4.3  CL 98   < > 96* 106 104 101 100  CO2 29   < > 25 27 27 27 26   GLUCOSE 257*   < > 753* 159* 71 83 126*  BUN 19   < > 12 12 10 6  <5*  CREATININE 0.88   < > 0.80 0.70 0.60 0.64 0.72  CALCIUM 8.3*   < > 8.8* 9.0 8.3* 8.2* 8.3*  MG 1.8  --   --   --   --   --   --    < > = values in this interval not displayed.   Liver Function Tests: Recent Labs  Lab 01/30/21 1919 01/31/21 0344 02/01/21 0436  AST 22 27 20   ALT 25 23 18   ALKPHOS 81 69 60  BILITOT 0.6 0.8 1.1  PROT 5.8*  5.7* 5.6*  ALBUMIN 2.1* 1.9* 2.4*   CBC: Recent Labs  Lab 01/30/21 1006 01/31/21 0344  WBC 5.3 5.0  NEUTROABS 3.3  --   HGB 12.7 9.9*  HCT 39.5 30.2*  MCV 93.2 90.7  PLT 386 329   CBG: Recent Labs  Lab 02/02/21 0404 02/02/21 0733 02/02/21 1300 02/02/21 1419 02/02/21 1555  GLUCAP 128* 115* 126* 129* 186*   HbA1C: No results for input(s): HGBA1C in the last 72 hours. Urine analysis:    Component Value Date/Time   COLORURINE STRAW (A) 01/30/2021 0956   APPEARANCEUR CLEAR 01/30/2021 0956   APPEARANCEUR Clear 11/15/2020 1531   LABSPEC 1.008 01/30/2021 0956   PHURINE 6.0 01/30/2021 0956   GLUCOSEU >=500 (A) 01/30/2021  0956   HGBUR SMALL (A) 01/30/2021 0956   BILIRUBINUR NEGATIVE 01/30/2021 0956   BILIRUBINUR NEG 12/05/2020 1353   BILIRUBINUR Negative 11/15/2020 1531   KETONESUR NEGATIVE 01/30/2021 0956   PROTEINUR 100 (A) 01/30/2021 0956   UROBILINOGEN 0.2 12/05/2020 1353   UROBILINOGEN 0.2 09/13/2012 2014   NITRITE NEGATIVE 01/30/2021 0956   LEUKOCYTESUR NEGATIVE 01/30/2021 0956    Recent Results (from the past 240 hour(s))  Resp Panel by RT-PCR (Flu A&B, Covid) Nasopharyngeal Swab     Status: None   Collection Time: 01/23/21  7:26 PM   Specimen: Nasopharyngeal Swab; Nasopharyngeal(NP) swabs in vial transport medium  Result Value Ref Range Status   SARS Coronavirus 2 by RT PCR NEGATIVE NEGATIVE Final    Comment: (NOTE) SARS-CoV-2 target nucleic acids are NOT DETECTED.  The SARS-CoV-2 RNA is generally detectable in upper respiratory specimens during the acute phase of infection. The lowest concentration of SARS-CoV-2 viral copies this assay can detect is 138 copies/mL. A negative result does not preclude SARS-Cov-2 infection and should not be used as the sole basis for treatment or other patient management decisions. A negative result may occur with  improper specimen collection/handling, submission of specimen other than nasopharyngeal swab, presence of viral mutation(s) within the areas targeted by this assay, and inadequate number of viral copies(<138 copies/mL). A negative result must be combined with clinical observations, patient history, and epidemiological information. The expected result is Negative.  Fact Sheet for Patients:  EntrepreneurPulse.com.au  Fact Sheet for Healthcare Providers:  IncredibleEmployment.be  This test is no t yet approved or cleared by the [place] FDA and  has been authorized for detection and/or diagnosis of SARS-CoV-2 by FDA under an Emergency Use Authorization (EUA). This EUA will remain  in effect (meaning this  test can be used) for the duration of the COVID-19 declaration under Section 564(b)(1) of the Act, 21 U.S.C.section 360bbb-3(b)(1), unless the authorization is terminated  or revoked sooner.       Influenza A by PCR NEGATIVE NEGATIVE Final   Influenza B by PCR NEGATIVE NEGATIVE Final    Comment: (NOTE) The Xpert Xpress SARS-CoV-2/FLU/RSV plus assay is intended as an aid in the diagnosis of influenza from Nasopharyngeal swab specimens and should not be used as a sole basis for treatment. Nasal washings and aspirates are unacceptable for Xpert Xpress SARS-CoV-2/FLU/RSV testing.  Fact Sheet for Patients: EntrepreneurPulse.com.au  Fact Sheet for Healthcare Providers: IncredibleEmployment.be  This test is not yet approved or cleared by the [place] FDA and has been authorized for detection and/or diagnosis of SARS-CoV-2 by FDA under an Emergency Use Authorization (EUA). This EUA will remain in effect (meaning this test can be used) for the duration of the COVID-19 declaration under Section 564(b)(1) of the Act,  21 U.S.C. section 360bbb-3(b)(1), unless the authorization is terminated or revoked.  Performed at [facility], [address], [place], [place] [address]   MRSA PCR Screening     Status: None   Collection Time: 01/23/21 10:49 PM   Specimen: Nasal Mucosa; Nasopharyngeal  Result Value Ref Range Status   MRSA by PCR NEGATIVE NEGATIVE Final    Comment:        The GeneXpert MRSA Assay (FDA approved for NASAL specimens only), is one component of a comprehensive MRSA colonization surveillance program. It is not intended to diagnose MRSA infection nor to guide or monitor treatment for MRSA infections. Performed at [facility], [address] [place], [place] [address]   Resp Panel by RT-PCR (Flu A&B, Covid) Nasopharyngeal Swab     Status: None   Collection Time: 01/30/21 10:29 AM   Specimen: Nasopharyngeal Swab; Nasopharyngeal(NP)  swabs in vial transport medium  Result Value Ref Range Status   SARS Coronavirus 2 by RT PCR NEGATIVE NEGATIVE Final    Comment: (NOTE) SARS-CoV-2 target nucleic acids are NOT DETECTED.  The SARS-CoV-2 RNA is generally detectable in upper respiratory specimens during the acute phase of infection. The lowest concentration of SARS-CoV-2 viral copies this assay can detect is 138 copies/mL. A negative result does not preclude SARS-Cov-2 infection and should not be used as the sole basis for treatment or other patient management decisions. A negative result may occur with  improper specimen collection/handling, submission of specimen other than nasopharyngeal swab, presence of viral mutation(s) within the areas targeted by this assay, and inadequate number of viral copies(<138 copies/mL). A negative result must be combined with clinical observations, patient history, and epidemiological information. The expected result is Negative.  Fact Sheet for Patients:  EntrepreneurPulse.com.au  Fact Sheet for Healthcare Providers:  IncredibleEmployment.be  This test is no t yet approved or cleared by the [place] FDA and  has been authorized for detection and/or diagnosis of SARS-CoV-2 by FDA under an Emergency Use Authorization (EUA). This EUA will remain  in effect (meaning this test can be used) for the duration of the COVID-19 declaration under Section 564(b)(1) of the Act, 21 U.S.C.section 360bbb-3(b)(1), unless the authorization is terminated  or revoked sooner.       Influenza A by PCR NEGATIVE NEGATIVE Final   Influenza B by PCR NEGATIVE NEGATIVE Final    Comment: (NOTE) The Xpert Xpress SARS-CoV-2/FLU/RSV plus assay is intended as an aid in the diagnosis of influenza from Nasopharyngeal swab specimens and should not be used as a sole basis for treatment. Nasal washings and aspirates are unacceptable for Xpert Xpress  SARS-CoV-2/FLU/RSV testing.  Fact Sheet for Patients: EntrepreneurPulse.com.au  Fact Sheet for Healthcare Providers: IncredibleEmployment.be  This test is not yet approved or cleared by the [place] FDA and has been authorized for detection and/or diagnosis of SARS-CoV-2 by FDA under an Emergency Use Authorization (EUA). This EUA will remain in effect (meaning this test can be used) for the duration of the COVID-19 declaration under Section 564(b)(1) of the Act, 21 U.S.C. section 360bbb-3(b)(1), unless the authorization is terminated or revoked.  Performed at [facility], [address], [place], [place] [address]      Scheduled Meds: . alfuzosin  10 mg Oral Q breakfast  . Chlorhexidine Gluconate Cloth  6 each Topical Daily  . citalopram  10 mg Oral Daily  . furosemide  40 mg Intravenous Daily  . gabapentin  100 mg Oral Daily  . insulin aspart  0-5 Units  Subcutaneous QHS  . insulin aspart  0-9 Units Subcutaneous TID WC  . insulin glargine  7 Units Subcutaneous QHS  . ondansetron  4 mg Oral TID AC & HS  . pantoprazole  40 mg Oral Daily  . polyethylene glycol  17 g Oral Q1 Hr x 6  . tamsulosin  0.4 mg Oral Daily  . traZODone  50 mg Oral QHS   Continuous Infusions: . dextrose 5% lactated ringers 50 mL/hr at 02/01/21 1550    Procedures/Studies: DG Chest 2 View  Result Date: 02/02/2021 CLINICAL DATA:  Shortness of breath. EXAM: CHEST - 2 VIEW COMPARISON:  January 30, 2021. FINDINGS: The heart size and mediastinal contours are within normal limits. No pneumothorax is noted. Minimal bibasilar subsegmental atelectasis is noted with minimal pleural effusions. The visualized skeletal structures are unremarkable. IMPRESSION: Minimal bibasilar subsegmental atelectasis with minimal pleural effusions. Electronically Signed   By: Marijo Conception M.D.   On: 02/02/2021 13:35   DG Abd 1 View  Result Date: 02/01/2021 CLINICAL DATA:  Abdominal  distension EXAM: ABDOMEN - 1 VIEW COMPARISON:  None. FINDINGS: The bowel gas pattern is normal. No radio-opaque calculi or other significant radiographic abnormality are seen. IMPRESSION: Negative. Electronically Signed   By: Fidela Salisbury MD   On: 02/01/2021 21:58   US Venous Img Lower Bilateral  Result Date: 01/24/2021 CLINICAL DATA:  Shortness of breath. Bilateral lower extremity swelling. EXAM: BILATERAL LOWER EXTREMITY VENOUS DOPPLER ULTRASOUND TECHNIQUE: Gray-scale sonography with compression, as well as color and duplex ultrasound, were performed to evaluate the deep venous system(s) from the level of the common femoral vein through the popliteal and proximal calf veins. COMPARISON:  No prior FINDINGS: VENOUS Normal compressibility of the common femoral, superficial femoral, and popliteal veins, as well as the visualized calf veins. Visualized portions of profunda femoral vein and great saphenous vein unremarkable. No filling defects to suggest DVT on grayscale or color Doppler imaging. Doppler waveforms show normal direction of venous flow, normal respiratory plasticity and response to augmentation. Limited views of the contralateral common femoral vein are unremarkable. OTHER Prominent bilateral inguinal lymph nodes noted. The largest is in right groin and measures 2.9 cm. Bilateral lower extremity edema noted. IMPRESSION: 1.  No evidence of DVT. 2. Prominent bilateral inguinal lymph nodes noted. The largest is in the right groin and measures 2.9 cm. Bilateral lower extremity edema noted. Electronically Signed   By: Marcello Moores  Register   On: 01/24/2021 10:35   DG Chest Portable 1 View  Result Date: 01/30/2021 CLINICAL DATA:  Lower extremity edema. Chest pain and elevated blood sugar. EXAM: PORTABLE CHEST 1 VIEW COMPARISON:  01/23/2021 FINDINGS: 1017 hours. The lungs are clear without focal pneumonia, edema, pneumothorax or pleural effusion. The cardiopericardial silhouette is within normal limits for  size. The visualized bony structures of the thorax show no acute abnormality. IMPRESSION: No active disease. Electronically Signed   By: Misty Stanley M.D.   On: 01/30/2021 10:28   DG Chest Port 1 View  Result Date: 01/23/2021 CLINICAL DATA:  Shortness of breath with lower extremity edema EXAM: PORTABLE CHEST 1 VIEW COMPARISON:  Chest radiograph January 17, 2021; CT angiogram chest January 17, 2021 FINDINGS: Lungs are clear. Heart size and pulmonary vascularity are normal. No adenopathy. No pneumothorax. No bone lesions. IMPRESSION: Lungs clear.  Cardiac silhouette normal. Electronically Signed   By: Lowella Grip III M.D.   On: 01/23/2021 19:26    Barton Dubois, MD  [facility]  If 7PM-7AM, please contact  night-coverage [email or web address] Password [email or web address] 02/02/2021, 6:25 PM   LOS: 2 days

## 2021-02-02 NOTE — Progress Notes (Signed)
Patient has ben monitored for increasing temp, MD made aware of 100.6 reading . Tylenol given as ordered. Patient has also has baseline increased HR, on telemetry, Sinus tachy. MD aware.  Will continue to monitor patient.

## 2021-02-03 ENCOUNTER — Inpatient Hospital Stay (HOSPITAL_COMMUNITY): Payer: Self-pay | Admitting: Certified Registered Nurse Anesthetist

## 2021-02-03 ENCOUNTER — Encounter (HOSPITAL_COMMUNITY): Payer: Self-pay | Admitting: Internal Medicine

## 2021-02-03 ENCOUNTER — Telehealth: Payer: Self-pay | Admitting: Nurse Practitioner

## 2021-02-03 ENCOUNTER — Other Ambulatory Visit: Payer: Self-pay

## 2021-02-03 ENCOUNTER — Encounter (HOSPITAL_COMMUNITY)
Admission: EM | Disposition: A | Discharge status: Home / Self Care | Payer: Self-pay | Source: Home / Self Care | Attending: Internal Medicine

## 2021-02-03 DIAGNOSIS — R933 Abnormal findings on diagnostic imaging of other parts of digestive tract: Secondary | ICD-10-CM

## 2021-02-03 DIAGNOSIS — N320 Bladder-neck obstruction: Secondary | ICD-10-CM

## 2021-02-03 DIAGNOSIS — K5000 Crohn's disease of small intestine without complications: Secondary | ICD-10-CM

## 2021-02-03 HISTORY — PX: COLONOSCOPY WITH PROPOFOL: SHX5780

## 2021-02-03 HISTORY — PX: BIOPSY: SHX5522

## 2021-02-03 LAB — BASIC METABOLIC PANEL
Anion gap: 6 (ref 5–15)
BUN: 6 mg/dL (ref 6–20)
CO2: 26 mmol/L (ref 22–32)
Calcium: 8.4 mg/dL — ABNORMAL LOW (ref 8.9–10.3)
Chloride: 103 mmol/L (ref 98–111)
Creatinine, Ser: 0.91 mg/dL (ref 0.44–1.00)
GFR, Estimated: >60 mL/min (ref >60)
Glucose, Bld: 181 mg/dL — ABNORMAL HIGH (ref 70–99)
Potassium: 4.5 mmol/L (ref 3.5–5.1)
Sodium: 135 mmol/L (ref 135–145)

## 2021-02-03 LAB — CBC
HCT: 29.4 % — ABNORMAL LOW (ref 36.0–46.0)
Hemoglobin: 9.5 g/dL — ABNORMAL LOW (ref 12.0–15.0)
MCH: 29.7 pg (ref 26.0–34.0)
MCHC: 32.3 g/dL (ref 30.0–36.0)
MCV: 91.9 fL (ref 80.0–100.0)
Platelets: 295 10*3/uL (ref 150–400)
RBC: 3.2 MIL/uL — ABNORMAL LOW (ref 3.87–5.11)
RDW: 12.9 % (ref 11.5–15.5)
WBC: 5.2 10*3/uL (ref 4.0–10.5)
nRBC: 0 % (ref 0.0–0.2)

## 2021-02-03 LAB — GLUCOSE, CAPILLARY
Glucose-Capillary: 185 mg/dL — ABNORMAL HIGH (ref 70–99)
Glucose-Capillary: 161 mg/dL — ABNORMAL HIGH (ref 70–99)
Glucose-Capillary: 137 mg/dL — ABNORMAL HIGH (ref 70–99)
Glucose-Capillary: 134 mg/dL — ABNORMAL HIGH (ref 70–99)
Glucose-Capillary: 101 mg/dL — ABNORMAL HIGH (ref 70–99)
Glucose-Capillary: 139 mg/dL — ABNORMAL HIGH (ref 70–99)

## 2021-02-03 LAB — MAGNESIUM: Magnesium: 1.9 mg/dL (ref 1.7–2.4)

## 2021-02-03 SURGERY — COLONOSCOPY WITH PROPOFOL
Anesthesia: General

## 2021-02-03 MED ORDER — ONDANSETRON HCL 4 MG/2ML IJ SOLN
INTRAMUSCULAR | Status: AC
  Filled 2021-02-03: qty 2

## 2021-02-03 MED ORDER — PROCHLORPERAZINE EDISYLATE 10 MG/2ML IJ SOLN: 10.0000 mg | Freq: Four times a day (QID) | INTRAMUSCULAR | Status: DC | PRN

## 2021-02-03 MED ORDER — ALFUZOSIN HCL ER 10 MG PO TB24: 10.0000 mg | ORAL_TABLET | Freq: Every day | ORAL | Status: DC

## 2021-02-03 MED ORDER — FUROSEMIDE 20 MG PO TABS: 40.0000 mg | ORAL_TABLET | Freq: Every day | ORAL | Status: DC

## 2021-02-03 MED ORDER — PROPOFOL 10 MG/ML IV BOLUS
INTRAVENOUS | Status: DC | PRN
  Administered 2021-02-03: 100 mg via INTRAVENOUS

## 2021-02-03 MED ORDER — STERILE WATER FOR IRRIGATION IR SOLN
Status: DC | PRN
  Administered 2021-02-03: 100 mL

## 2021-02-03 MED ORDER — METOCLOPRAMIDE HCL 5 MG PO TABS: 5.0000 mg | ORAL_TABLET | Freq: Three times a day (TID) | ORAL | 0 refills | Status: DC | PRN

## 2021-02-03 MED ORDER — LACTATED RINGERS IV SOLN: INTRAVENOUS | Status: DC

## 2021-02-03 MED ORDER — INSULIN GLARGINE 100 UNIT/ML ~~LOC~~ SOLN: 30.0000 [IU] | Freq: Every day | SUBCUTANEOUS | Status: DC

## 2021-02-03 MED ORDER — ONDANSETRON HCL 4 MG/2ML IJ SOLN
4.0000 mg | Freq: Once | INTRAMUSCULAR | Status: AC
  Administered 2021-02-03: 4 mg via INTRAVENOUS

## 2021-02-03 MED ORDER — MIDAZOLAM HCL 2 MG/2ML IJ SOLN
INTRAMUSCULAR | Status: AC
  Filled 2021-02-03: qty 2

## 2021-02-03 MED ORDER — MIDAZOLAM HCL 2 MG/2ML IJ SOLN
INTRAMUSCULAR | Status: DC | PRN
  Administered 2021-02-03: 2 mg via INTRAVENOUS

## 2021-02-03 MED ORDER — PANTOPRAZOLE SODIUM 40 MG PO TBEC: 40.0000 mg | DELAYED_RELEASE_TABLET | Freq: Every day | ORAL | 1 refills | Status: DC

## 2021-02-03 MED ORDER — PROPOFOL 500 MG/50ML IV EMUL
INTRAVENOUS | Status: DC | PRN
  Administered 2021-02-03: 150 ug/kg/min via INTRAVENOUS

## 2021-02-03 MED ORDER — ONDANSETRON 8 MG PO TBDP: 8.0000 mg | ORAL_TABLET | Freq: Three times a day (TID) | ORAL | 0 refills | Status: DC | PRN

## 2021-02-03 NOTE — Anesthesia Postprocedure Evaluation (Signed)
Anesthesia Post Note  Patient: Diamond [name]  Procedure(s) Performed: COLONOSCOPY WITH PROPOFOL (N/A ) BIOPSY  Anesthesia Type: General Level of consciousness: awake and alert Pain management: satisfactory to patient Vital Signs Assessment: post-procedure vital signs reviewed and stable Respiratory status: spontaneous breathing and respiratory function stable Cardiovascular status: blood pressure returned to baseline and stable Postop Assessment: no apparent nausea or vomiting Anesthetic complications: no   No complications documented.   Last Vitals:  Vitals:   02/03/21 1020 02/03/21 1023  BP:  118/73  Pulse:  (!) 112  Resp:  18  Temp: 37.4 C   SpO2:  96%    Last Pain:  Vitals:   02/03/21 1102  TempSrc:   PainSc: [place] Velmer Woelfel

## 2021-02-03 NOTE — Transfer of Care (Signed)
Immediate Anesthesia Transfer of Care Note  Patient: Diamond [name]  Procedure(s) Performed: COLONOSCOPY WITH PROPOFOL (N/A ) BIOPSY  Patient Location: PACU  Anesthesia Type:General  Level of Consciousness: drowsy  Airway & Oxygen Therapy: Patient Spontanous Breathing  Post-op Assessment: Report given to RN and Post -op Vital signs reviewed and stable  Post vital signs: Reviewed and stable  Last Vitals:  Vitals Value Taken Time  BP    Temp    Pulse    Resp    SpO2      Last Pain:  Vitals:   02/03/21 1102  TempSrc:   PainSc: 6       Patients Stated Pain Goal: 8 (53/64/68 0321)  Complications: No complications documented.

## 2021-02-03 NOTE — Progress Notes (Signed)
Colonoscopy complete, normal appearing colon s/p biopsies. See procedure report. Started on ADA diet. Patient ok for d/c from GI standpoint, will arrange outpatient follow-up visit.  We will sign off for now, will remove consult order. Please contact us and enter a new consult order if we can be of further assistance.  Thank you for allowing [place] to participate in the care of [place], DNP, AGNP-C Adult & Gerontological Nurse Practitioner [facility]

## 2021-02-03 NOTE — Interval H&P Note (Signed)
History and Physical Interval Note:  02/03/2021 10:33 AM  Diamond [name]  has presented today for surgery, with the diagnosis of Crohns's disease.  The various methods of treatment have been discussed with the patient and family. After consideration of risks, benefits and other options for treatment, the patient has consented to  Procedure(s): COLONOSCOPY WITH PROPOFOL (N/A) as a surgical intervention.  The patient's history has been reviewed, patient examined, no change in status, stable for surgery.  I have reviewed the patient's chart and labs.  Questions were answered to the patient's satisfaction.     Eloise Harman

## 2021-02-03 NOTE — Op Note (Signed)
[facility] Patient Name: Diamond [name] Procedure Date: 02/03/2021 11:00 AM MRN: [id number] Date of Birth: 1996/11/18 Attending MD: Elon Alas. Abbey Chatters DO CSN: [id number] Age: 25 Admit Type: Outpatient Procedure:                Colonoscopy Indications:              Crohn's disease of the small bowel Providers:                Elon Alas. Abbey Chatters, DO, Jessica Boudreaux, Casimer Bilis, Technician Referring MD:              Medicines:                See the Anesthesia note for documentation of the                            administered medications Complications:            No immediate complications. Estimated Blood Loss:     Estimated blood loss: none. Procedure:                Pre-Anesthesia Assessment:                           - The anesthesia plan was to use monitored                            anesthesia care (MAC).                           After obtaining informed consent, the colonoscope                            was passed under direct vision. Throughout the                            procedure, the patient's blood pressure, pulse, and                            oxygen saturations were monitored continuously. The                            [id number]-H190DL ([id number]) scope was introduced through                            the anus and advanced to the the cecum, identified                            by appendiceal orifice and ileocecal valve. The                            colonoscopy was performed without difficulty. The                            patient tolerated the procedure well. The  quality                            of the bowel preparation was evaluated using the                            BBPS [facility] Bowel Preparation Scale) with scores                            of: Right Colon = 2 (minor amount of residual                            staining, small fragments of stool and/or opaque                            liquid, but mucosa seen  well), Transverse Colon = 2                            (minor amount of residual staining, small fragments                            of stool and/or opaque liquid, but mucosa seen                            well) and Left Colon = 2 (minor amount of residual                            staining, small fragments of stool and/or opaque                            liquid, but mucosa seen well). The total BBPS score                            equals 6. The quality of the bowel preparation was                            fair. Scope In: 11:01:37 AM Scope Out: 11:11:17 AM Scope Withdrawal Time: 0 hours 5 minutes 57 seconds  Total Procedure Duration: 0 hours 9 minutes 40 seconds  Findings:      The perianal and digital rectal examinations were normal.      Non-bleeding internal hemorrhoids were found during endoscopy.      The terminal ileum appeared normal. Biopsies were taken with a cold       forceps for histology.      A moderate amount of stool was found in the entire colon, making       visualization difficult. Lavage of the area was performed using copious       amounts of sterile water, resulting in clearance with fair visualization. Impression:               - Preparation of the colon was fair.                           - Non-bleeding internal hemorrhoids.                           -  The examined portion of the ileum was normal.                            Biopsied.                           - Stool in the entire examined colon. Moderate Sedation:      Per Anesthesia Care Recommendation:           - Return patient to hospital ward for ongoing care.                           - Diabetic (ADA) diet.                           - Continue present medications.                           - No evidence of Crohn's disease on this exam. I                            took biopsies of her terminal ileum and we will                            await results. Start on diet, okay to discharge                             home from [facility]. We will arrange follow up                            with [place]. Procedure Code(s):        --- Professional ---                           [phone], Colonoscopy, flexible; with biopsy, single                            or multiple Diagnosis Code(s):        --- Professional ---                           K64.8, Other hemorrhoids                           K50.00, Crohn's disease of small intestine without                            complications CPT copyright 2019 [employer]. All rights reserved. The codes documented in this report are preliminary and upon coder review may  be revised to meet current compliance requirements. Elon Alas. Abbey Chatters, DO [employer] Abbey Chatters, DO 02/03/2021 11:18:47 AM This report has been signed electronically. Number of Addenda: 0

## 2021-02-03 NOTE — Discharge Summary (Signed)
Physician Discharge Summary  Diamond [name] TIR:[id number] DOB: 1996-11-04 DOA: 01/30/2021  PCP: Diamond Dryer, PA-C  Admit date: 01/30/2021 Discharge date: 02/03/2021  Time spent: 35 minutes  Recommendations for Outpatient Follow-up:  1. Repeat basic metabolic panel to evaluate lites and renal function 2. Close monitoring of patient blood pressure and volume status with further adjustment to antihypertensive regimen and diuretics. 3. Close monitoring of patient CBGs/A1c with further treatment of hypoglycemia regimen as required. 4. Outpatient follow-up with gastroenterology service.   Discharge Diagnoses:  Active Problems:   Uncontrolled type 1 diabetes mellitus with hyperglycemia (HCC)   Anasarca   Crohn's disease ([place])   Hyperosmolar hyperglycemic state (HHS) ([place])   Constipation   Abdominal pain, epigastric   Abnormal computed tomography of cecum and terminal ileum   Bladder outlet obstruction   Discharge Condition: Stable and improved.  Patient discharged home with treatment follow-up with PCP and GI service.  Code status: Full code.  Diet recommendation: heart healthy and modified carb diet.  Filed Weights   01/31/21 0409 02/01/21 0421 02/03/21 1020  Weight: 62.1 kg 62.9 kg 62.9 kg    History of present illness:  As per H&P written by Dr. Carles [name] on 01/30/21 Diamond [name] is a 25 y.o. female with medical history of DM type 1, bipolar disorder, anasarca, Crohn's Disease,and hypertension presenting with high blood sugars. The patient was recently discharged from the hospital after a stay from 01/23/21 to 2/15/22for treatment of DKA. The patient was discharged home with instructions to get Lantus from the free clinic. Apparently, the patient never picked up the Lantus. The patient gives often conflicting information regarding her insulin usage. Essentially, it appears that the patient has not taken any long-acting insulin since her discharge on 01/27/2021. She states  that she may have intermittently taken some short acting insulin over the weekend after which she was discharged. Nevertheless, it has been well documented regarding the patient's poor compliance with her medications. She has had numerous admissions in the hospital for her DKA and uncontrolled hyperglycemia. The patient states that she came to the hospital forpreop evaluation for her EGD and colonoscopy scheduled for 01/31/21.Apparently, the patient was noted to have a CBG reading of "high". As result, the patient was sent to emergency department for further evaluation. The patient endorses polydipsia and polyuria. She states that she has had nausea and vomiting for the past 2 days at home. She denies any fevers, chills, headache, sore throat. She has chronic intermittent chest discomfort and dyspnea on exertion. She denies any diarrhea, hematochezia, melena. She states that she has intermittent abdominal pain. In the emergency department, the patient was afebrile and hemodynamically stable with heart rate 110-120. Oxygen saturation was 90-100% on room air. BMP showed a sodium 131, potassium 4.4, serum creatinine 0.0. Serum glucose was 753. Anion gap was 10. WBC 5.3, hemoglobin 12.7, platelets 286,000. Chest x-ray was negative for any infiltrates or edema. VBG showed 7.347/47/35/22. The patient was started on intravenous insulin and IV fluids.  Hospital Course:  Hyperosmolar state -patient started on IV insulin with q 1 hour CBG check and q 4 hour BMPs -pt started on aggressive fluid resuscitation -Electrolytes were monitored and repleted appropriately. -transitioned to Sylva insulin once anion gap closed and CBGs stabilized. -HbA1C>15.5 on 08/25/20 -No further episode of hypoglycemia currently. -Educated about importance of medication compliance and tolerance complaints. -Patient close monitoring of CBGs/A1c with further adjustments to her hypoglycemic regimen.  Crohn's  disease -Appreciate GI consultation -Endoscopy from 3-22 without  significant abnormalities -S/P colonoscopy on 02/03/2021, no significant abnormalities appreciated. Biopsies taken -patient will contninue to follow up with GI as an outpatient.  Throat Swelling sensation?? -personally visualized-->she has enlarged tonsils and large uvula, but NO tongue or lip swelling -no SOB or dysphagia -swallowing without any problems. .  Sinus tachycardia -Secondary to volume depletion and acute medical illness -personally reviewed EKg--sinus tach, no STT changes -Continue to maintain adequate nutrition and hydration -resume low dose lasix.  Atypical chest pain -Likely secondary to the patient's vomiting -Cycle troponins 12>>11 -Chest x-ray without any infiltrates or edema -Continue treatment with PPI. -Currently chest pain-free.  Anasarca -Secondary to hypoalbuminemia and proteinuria -The patient had 3.95 g ofprotein on urine protein creatinine ratio on 10/18/2020 -Restart ACE inhibitors at discharge. -Resume adjusted dose of Lasix and follow daily weights. -10/19/2020 echo EF 65 to 70%, no WMA, trivial MR -advised to follow low sodium diet.  Bipolar disorder -Continue trazodone and Celexa -Stable mood. -No suicidal ideation or hallucination.  Hyponatremia -Secondary to hyperglycemia -resolved and has remained stable currently -Continue to follow electrolytes trend. -Continue to maintain adequate hydration.  Essential hypertension -Overall stable blood pressure. -Advised to follow low-sodium diet.  Fever -isolated and not recurrent episode. -no source of infection appreciated -Chest x-ray with no acute cardiopulmonary process; urinalysis not demonstrating UTI.  Patient denies dysuria. -PRN antipyretics to be continued.    Procedures:  See below for x-ray reports.   Consultations:  GI service  Discharge Exam: Vitals:   02/03/21 1141 02/03/21 1337  BP: (!)  134/91 (!) 124/96  Pulse: (!) 108 (!) 109  Resp:  18  Temp: 98.6 F (37 C) 98.2 F (36.8 C)  SpO2: 94% 99%    General: Afebrile, no chest pain, no nausea, no vomiting.  Good oxygen saturation.  Reports intermittent nausea but no further vomiting. Cardiovascular: Sinus tachycardia; no rubs, no gallops, no JVD. Respiratory: Clear to auscultation bilaterally. Abdomen: Soft, nontender, nondistended, positive bowel sounds. Extremities: No cyanosis or clubbing.  Trace edema. Appreciated bilaterally.  Discharge Instructions   Discharge Instructions    Diet - low sodium heart healthy   Complete by: As directed    Discharge instructions   Complete by: As directed    Follow-up with gastroenterology service as instructed  Arrange follow-up with PCP in 10 days  Avoid skipping meals and be compliant with insulin therapy Take medications as prescribed Maintain adequate hydration Follow low-sodium diet.   Increase activity slowly   Complete by: As directed    No dressing needed   Complete by: As directed      Allergies as of 02/03/2021      Reactions   Amoxicillin Hives   Did it involve swelling of the face/tongue/throat, SOB, or low BP? no  Did it involve sudden or severe rash/hives, skin peeling, or any reaction on the inside of your mouth or nose? no Did you need to seek medical attention at a hospital or doctor's office? no When did it last happen?unk If all above answers are "NO", may proceed with cephalosporin use.   Dexamethasone Hives, Itching   Doxycycline Itching, Swelling   Penicillins Hives, Itching   Did it involve swelling of the face/tongue/throat, SOB, or low BP? no  Did it involve sudden or severe rash/hives, skin peeling, or any reaction on the inside of your mouth or nose? no Did you need to seek medical attention at a hospital or doctor's office? no When did it last happen?unk If all above answers are "  NO", may proceed with cephalosporin use. Did it  involve swelling of the face/tongue/throat, SOB, or low BP? no  Did it involve sudden or severe rash/hives, skin peeling, or any reaction on the inside of your mouth or nose? no Did you need to seek medical attention at a hospital or doctor's office? no When did it last happen?unk If all above answers are "NO", may proceed with cephalosporin use. Tolerated keflex May 2021 with no issue.   Augmentin [amoxicillin-pot Clavulanate] Other (See Comments)   Reaction unknown   Ceftriaxone Sodium In Dextrose    Clavulanic Acid    Adhesive [tape] Itching, Rash   Latex Itching, Rash   Other Itching, Rash   Pineapple Rash   Rash on tongue and throat      Medication List    STOP taking these medications   ondansetron 4 MG tablet Commonly known as: ZOFRAN   promethazine 25 MG tablet Commonly known as: PHENERGAN     TAKE these medications   albuterol 108 (90 Base) MCG/ACT inhaler Commonly known as: VENTOLIN HFA Inhale 2 puffs into the lungs every 6 (six) hours as needed. For shortness of breath What changed: reasons to take this   alfuzosin 10 MG 24 hr tablet Commonly known as: UROXATRAL Take 1 tablet (10 mg total) by mouth at bedtime. Start taking on: February 04, 2021 What changed: when to take this   citalopram 10 MG tablet Commonly known as: CELEXA Take 10 mg by mouth daily.   furosemide 20 MG tablet Commonly known as: LASIX Take 2 tablets (40 mg total) by mouth daily. What changed: how much to take   gabapentin 100 MG capsule Commonly known as: NEURONTIN Take 1 capsule (100 mg total) by mouth daily.   insulin glargine 100 UNIT/ML injection Commonly known as: LANTUS Inject 0.3 mLs (30 Units total) into the skin daily. What changed: how much to take   insulin lispro 100 UNIT/ML KwikPen Commonly known as: HumaLOG KwikPen Inject 10-16 Units into the skin 3 (three) times daily before meals. What changed: additional instructions   lisinopril 20 MG tablet Commonly  known as: ZESTRIL Take 1 tablet (20 mg total) by mouth daily.   metoCLOPramide 5 MG tablet Commonly known as: Reglan Take 1 tablet (5 mg total) by mouth every 8 (eight) hours as needed for refractory nausea / vomiting (refractory symptoms).   ondansetron 8 MG disintegrating tablet Commonly known as: Zofran ODT Take 1 tablet (8 mg total) by mouth every 8 (eight) hours as needed for nausea or vomiting.   pantoprazole 40 MG tablet Commonly known as: PROTONIX Take 1 tablet (40 mg total) by mouth daily. Start taking on: February 04, 2021   Pen Needles 31G X 5 MM Misc To use with lantus.   potassium chloride 10 MEQ tablet Commonly known as: KLOR-CON Take 1 tablet (10 mEq total) by mouth daily. Take While taking Lasix/furosemide   tamsulosin 0.4 MG Caps capsule Commonly known as: FLOMAX Take 1 capsule (0.4 mg total) by mouth daily.   traZODone 50 MG tablet Commonly known as: DESYREL Take 50 mg by mouth at bedtime.            Discharge Care Instructions  (From admission, onward)         Start     Ordered   02/03/21 0000  No dressing needed        02/03/21 1455         Allergies  Allergen Reactions  . Amoxicillin Hives  Did it involve swelling of the face/tongue/throat, SOB, or low BP? no  Did it involve sudden or severe rash/hives, skin peeling, or any reaction on the inside of your mouth or nose? no Did you need to seek medical attention at a hospital or doctor's office? no When did it last happen?unk If all above answers are "NO", may proceed with cephalosporin use.  Marland Kitchen Dexamethasone Hives and Itching  . Doxycycline Itching and Swelling  . Penicillins Hives and Itching    Did it involve swelling of the face/tongue/throat, SOB, or low BP? no  Did it involve sudden or severe rash/hives, skin peeling, or any reaction on the inside of your mouth or nose? no Did you need to seek medical attention at a hospital or doctor's office? no When did it last  happen?unk If all above answers are "NO", may proceed with cephalosporin use. Did it involve swelling of the face/tongue/throat, SOB, or low BP? no  Did it involve sudden or severe rash/hives, skin peeling, or any reaction on the inside of your mouth or nose? no Did you need to seek medical attention at a hospital or doctor's office? no When did it last happen?unk If all above answers are "NO", may proceed with cephalosporin use. Tolerated keflex May 2021 with no issue.   . Augmentin [Amoxicillin-Pot Clavulanate] Other (See Comments)    Reaction unknown  . Ceftriaxone Sodium In Dextrose   . Clavulanic Acid   . Adhesive [Tape] Itching and Rash  . Latex Itching and Rash  . Other Itching and Rash  . Pineapple Rash    Rash on tongue and throat    Follow-up Information    Diamond Dryer, PA-C. Schedule an appointment as soon as possible for a visit in 10 day(s).   Specialty: Physician Assistant Contact information: [address] [place] [address] [phone]                The results of significant diagnostics from this hospitalization (including imaging, microbiology, ancillary and laboratory) are listed below for reference.    Significant Diagnostic Studies: DG Chest 2 View  Result Date: 02/02/2021 CLINICAL DATA:  Shortness of breath. EXAM: CHEST - 2 VIEW COMPARISON:  January 30, 2021. FINDINGS: The heart size and mediastinal contours are within normal limits. No pneumothorax is noted. Minimal bibasilar subsegmental atelectasis is noted with minimal pleural effusions. The visualized skeletal structures are unremarkable. IMPRESSION: Minimal bibasilar subsegmental atelectasis with minimal pleural effusions. Electronically Signed   By: Marijo Conception M.D.   On: 02/02/2021 13:35   DG Abd 1 View  Result Date: 02/01/2021 CLINICAL DATA:  Abdominal distension EXAM: ABDOMEN - 1 VIEW COMPARISON:  None. FINDINGS: The bowel gas pattern is normal. No radio-opaque  calculi or other significant radiographic abnormality are seen. IMPRESSION: Negative. Electronically Signed   By: Fidela Salisbury MD   On: 02/01/2021 21:58   US Venous Img Lower Bilateral  Result Date: 01/24/2021 CLINICAL DATA:  Shortness of breath. Bilateral lower extremity swelling. EXAM: BILATERAL LOWER EXTREMITY VENOUS DOPPLER ULTRASOUND TECHNIQUE: Gray-scale sonography with compression, as well as color and duplex ultrasound, were performed to evaluate the deep venous system(s) from the level of the common femoral vein through the popliteal and proximal calf veins. COMPARISON:  No prior FINDINGS: VENOUS Normal compressibility of the common femoral, superficial femoral, and popliteal veins, as well as the visualized calf veins. Visualized portions of profunda femoral vein and great saphenous vein unremarkable. No filling defects to suggest DVT on grayscale or  color Doppler imaging. Doppler waveforms show normal direction of venous flow, normal respiratory plasticity and response to augmentation. Limited views of the contralateral common femoral vein are unremarkable. OTHER Prominent bilateral inguinal lymph nodes noted. The largest is in right groin and measures 2.9 cm. Bilateral lower extremity edema noted. IMPRESSION: 1.  No evidence of DVT. 2. Prominent bilateral inguinal lymph nodes noted. The largest is in the right groin and measures 2.9 cm. Bilateral lower extremity edema noted. Electronically Signed   By: Marcello Moores  Register   On: 01/24/2021 10:35   DG Chest Portable 1 View  Result Date: 01/30/2021 CLINICAL DATA:  Lower extremity edema. Chest pain and elevated blood sugar. EXAM: PORTABLE CHEST 1 VIEW COMPARISON:  01/23/2021 FINDINGS: 1017 hours. The lungs are clear without focal pneumonia, edema, pneumothorax or pleural effusion. The cardiopericardial silhouette is within normal limits for size. The visualized bony structures of the thorax show no acute abnormality. IMPRESSION: No active disease.  Electronically Signed   By: Misty Stanley M.D.   On: 01/30/2021 10:28   DG Chest Port 1 View  Result Date: 01/23/2021 CLINICAL DATA:  Shortness of breath with lower extremity edema EXAM: PORTABLE CHEST 1 VIEW COMPARISON:  Chest radiograph January 17, 2021; CT angiogram chest January 17, 2021 FINDINGS: Lungs are clear. Heart size and pulmonary vascularity are normal. No adenopathy. No pneumothorax. No bone lesions. IMPRESSION: Lungs clear.  Cardiac silhouette normal. Electronically Signed   By: Lowella Grip III M.D.   On: 01/23/2021 19:26    Microbiology: Recent Results (from the past 240 hour(s))  Resp Panel by RT-PCR (Flu A&B, Covid) Nasopharyngeal Swab     Status: None   Collection Time: 01/30/21 10:29 AM   Specimen: Nasopharyngeal Swab; Nasopharyngeal(NP) swabs in vial transport medium  Result Value Ref Range Status   SARS Coronavirus 2 by RT PCR NEGATIVE NEGATIVE Final    Comment: (NOTE) SARS-CoV-2 target nucleic acids are NOT DETECTED.  The SARS-CoV-2 RNA is generally detectable in upper respiratory specimens during the acute phase of infection. The lowest concentration of SARS-CoV-2 viral copies this assay can detect is 138 copies/mL. A negative result does not preclude SARS-Cov-2 infection and should not be used as the sole basis for treatment or other patient management decisions. A negative result may occur with  improper specimen collection/handling, submission of specimen other than nasopharyngeal swab, presence of viral mutation(s) within the areas targeted by this assay, and inadequate number of viral copies(<138 copies/mL). A negative result must be combined with clinical observations, patient history, and epidemiological information. The expected result is Negative.  Fact Sheet for Patients:  EntrepreneurPulse.com.au  Fact Sheet for Healthcare Providers:  IncredibleEmployment.be  This test is no t yet approved or cleared by  the [place] FDA and  has been authorized for detection and/or diagnosis of SARS-CoV-2 by FDA under an Emergency Use Authorization (EUA). This EUA will remain  in effect (meaning this test can be used) for the duration of the COVID-19 declaration under Section 564(b)(1) of the Act, 21 U.S.C.section 360bbb-3(b)(1), unless the authorization is terminated  or revoked sooner.       Influenza A by PCR NEGATIVE NEGATIVE Final   Influenza B by PCR NEGATIVE NEGATIVE Final    Comment: (NOTE) The Xpert Xpress SARS-CoV-2/FLU/RSV plus assay is intended as an aid in the diagnosis of influenza from Nasopharyngeal swab specimens and should not be used as a sole basis for treatment. Nasal washings and aspirates are unacceptable for Xpert Xpress SARS-CoV-2/FLU/RSV testing.  Fact Sheet  for Patients: EntrepreneurPulse.com.au  Fact Sheet for Healthcare Providers: IncredibleEmployment.be  This test is not yet approved or cleared by the [place] FDA and has been authorized for detection and/or diagnosis of SARS-CoV-2 by FDA under an Emergency Use Authorization (EUA). This EUA will remain in effect (meaning this test can be used) for the duration of the COVID-19 declaration under Section 564(b)(1) of the Act, 21 U.S.C. section 360bbb-3(b)(1), unless the authorization is terminated or revoked.  Performed at [facility], [address] [place], [place] [address]      Labs: Basic Metabolic Panel: Recent Labs  Lab 01/30/21 1424 01/31/21 0344 02/01/21 0436 02/02/21 0518 02/03/21 0518  NA 144 139 135 135 135  K 3.7 3.9 3.8 4.3 4.5  CL 106 104 101 100 103  CO2 27 27 27 26 26   GLUCOSE 159* 71 83 126* 181*  BUN 12 10 6  <5* 6  CREATININE 0.70 0.60 0.64 0.72 0.91  CALCIUM 9.0 8.3* 8.2* 8.3* 8.4*  MG  --   --   --   --  1.9   Liver Function Tests: Recent Labs  Lab 01/30/21 1919 01/31/21 0344 02/01/21 0436  AST 22 27 20   ALT 25 23 18   ALKPHOS  81 69 60  BILITOT 0.6 0.8 1.1  PROT 5.8* 5.7* 5.6*  ALBUMIN 2.1* 1.9* 2.4*   No results for input(s): LIPASE, AMYLASE in the last 168 hours. No results for input(s): AMMONIA in the last 168 hours. CBC: Recent Labs  Lab 01/30/21 1006 01/31/21 0344 02/03/21 0518  WBC 5.3 5.0 5.2  NEUTROABS 3.3  --   --   HGB 12.7 9.9* 9.5*  HCT 39.5 30.2* 29.4*  MCV 93.2 90.7 91.9  PLT 386 329 295   BNP (last 3 results) Recent Labs    11/30/20 0936 01/23/21 1806 01/30/21 1007  BNP 50.0 44.0 76.0   CBG: Recent Labs  Lab 02/03/21 0119 02/03/21 0348 02/03/21 0803 02/03/21 1021 02/03/21 1141  GLUCAP 185* 161* 137* 134* 101*    Signed:  Barton Dubois MD.  [facility] 02/03/2021, 3:04 PM

## 2021-02-03 NOTE — Progress Notes (Signed)
Reviewed discharge instructions, diabetes management, and stressed the importance of medication compliance and foot care. Patient and family verbalized understanding of instructions.  Patient discharged home with family in stable condition.

## 2021-02-03 NOTE — Anesthesia Preprocedure Evaluation (Addendum)
Anesthesia Evaluation  Patient identified by MRN, date of birth, ID band Patient awake    Reviewed: Allergy & Precautions, NPO status , Patient's Chart, lab work & pertinent test results  History of Anesthesia Complications (+) Family history of anesthesia reaction and history of anesthetic complications (mother had cardiac arrest and died under anesthesia.)  Airway Mallampati: II  TM Distance: >3 FB Neck ROM: Full    Dental  (+) Dental Advisory Given, Missing, Chipped, Poor Dentition   Pulmonary asthma , pneumonia, resolved,           Cardiovascular Exercise Tolerance: Poor hypertension, Pt. on medications  Rhythm:Regular Rate:Normal     Neuro/Psych PSYCHIATRIC DISORDERS Anxiety Depression Bipolar Disorder  Neuromuscular disease CVA, Residual Symptoms    GI/Hepatic Neg liver ROS, GERD (gastroparesis )  Medicated,  Endo/Other  diabetes, Poorly Controlled, Type 2, Insulin Dependent  Renal/GU negative Renal ROS     Musculoskeletal negative musculoskeletal ROS (+)   Abdominal   Peds  (+) ADHD Hematology negative hematology ROS (+)   Anesthesia Other Findings   Reproductive/Obstetrics                            Anesthesia Physical  Anesthesia Plan  ASA: IV  Anesthesia Plan: General   Post-op Pain Management:    Induction: Intravenous  PONV Risk Score and Plan: TIVA  Airway Management Planned: Nasal Cannula and Natural Airway  Additional Equipment:   Intra-op Plan:   Post-operative Plan:   Informed Consent: I have reviewed the patients History and Physical, chart, labs and discussed the procedure including the risks, benefits and alternatives for the proposed anesthesia with the patient or authorized representative who has indicated his/her understanding and acceptance.     Dental advisory given  Plan Discussed with: CRNA and Surgeon  Anesthesia Plan Comments:          Anesthesia Quick Evaluation

## 2021-02-03 NOTE — Progress Notes (Signed)
Discussed with nurse. Patient bowel movement this morning (after prep yesterday evening) was clear/yellow. Will order tap water enema x 1 on call to colonoscopy. Procedure tentatively scheduled for later this afternoon.  Reviewed labs: hgb 9.5, electrolytes stable.   Thank you for allowing [place] to participate in the care of [place], DNP, AGNP-C Adult & Gerontological Nurse Practitioner [facility]

## 2021-02-03 NOTE — Telephone Encounter (Signed)
Please arrange outpatient follow-up in 6-8 weeks with Arvella Nigh or Dr. Abbey Chatters. Anticipate d/c today or tomorrow.

## 2021-02-05 LAB — TISSUE TRANSGLUTAMINASE, IGA: Tissue Transglutaminase Ab, IgA: <2 U/mL (ref 0–3)

## 2021-02-06 ENCOUNTER — Encounter: Payer: Self-pay | Admitting: Internal Medicine

## 2021-02-06 LAB — SURGICAL PATHOLOGY

## 2021-02-08 ENCOUNTER — Encounter (HOSPITAL_COMMUNITY): Payer: Self-pay | Admitting: Internal Medicine

## 2021-02-11 ENCOUNTER — Encounter (HOSPITAL_COMMUNITY): Payer: Self-pay | Admitting: Emergency Medicine

## 2021-02-11 ENCOUNTER — Emergency Department (HOSPITAL_COMMUNITY)
Admission: EM | Admit: 2021-02-11 | Discharge: 2021-02-12 | Disposition: A | Discharge status: Home / Self Care | Payer: Self-pay | Attending: Emergency Medicine | Admitting: Emergency Medicine

## 2021-02-11 ENCOUNTER — Other Ambulatory Visit: Payer: Self-pay

## 2021-02-11 DIAGNOSIS — R1084 Generalized abdominal pain: Secondary | ICD-10-CM | POA: Insufficient documentation

## 2021-02-11 DIAGNOSIS — E109 Type 1 diabetes mellitus without complications: Secondary | ICD-10-CM | POA: Insufficient documentation

## 2021-02-11 DIAGNOSIS — Z79899 Other long term (current) drug therapy: Secondary | ICD-10-CM | POA: Insufficient documentation

## 2021-02-11 DIAGNOSIS — R601 Generalized edema: Secondary | ICD-10-CM

## 2021-02-11 DIAGNOSIS — I10 Essential (primary) hypertension: Secondary | ICD-10-CM | POA: Insufficient documentation

## 2021-02-11 DIAGNOSIS — Z794 Long term (current) use of insulin: Secondary | ICD-10-CM | POA: Insufficient documentation

## 2021-02-11 DIAGNOSIS — Z91040 Latex allergy status: Secondary | ICD-10-CM | POA: Insufficient documentation

## 2021-02-11 DIAGNOSIS — M545 Low back pain, unspecified: Secondary | ICD-10-CM | POA: Insufficient documentation

## 2021-02-11 DIAGNOSIS — J45909 Unspecified asthma, uncomplicated: Secondary | ICD-10-CM | POA: Insufficient documentation

## 2021-02-11 DIAGNOSIS — M7989 Other specified soft tissue disorders: Secondary | ICD-10-CM | POA: Insufficient documentation

## 2021-02-11 LAB — CBC WITH DIFFERENTIAL/PLATELET
Abs Immature Granulocytes: 0.02 10*3/uL (ref 0.00–0.07)
Basophils Absolute: 0 10*3/uL (ref 0.0–0.1)
Basophils Relative: 1 %
Eosinophils Absolute: 0.4 10*3/uL (ref 0.0–0.5)
Eosinophils Relative: 6 %
HCT: 32.5 % — ABNORMAL LOW (ref 36.0–46.0)
Hemoglobin: 11 g/dL — ABNORMAL LOW (ref 12.0–15.0)
Immature Granulocytes: 0 %
Lymphocytes Relative: 32 %
Lymphs Abs: 2.2 10*3/uL (ref 0.7–4.0)
MCH: 30.3 pg (ref 26.0–34.0)
MCHC: 33.8 g/dL (ref 30.0–36.0)
MCV: 89.5 fL (ref 80.0–100.0)
Monocytes Absolute: 0.6 10*3/uL (ref 0.1–1.0)
Monocytes Relative: 8 %
Neutro Abs: 3.8 10*3/uL (ref 1.7–7.7)
Neutrophils Relative %: 53 %
Platelets: 373 10*3/uL (ref 150–400)
RBC: 3.63 MIL/uL — ABNORMAL LOW (ref 3.87–5.11)
RDW: 13.1 % (ref 11.5–15.5)
WBC: 7.1 10*3/uL (ref 4.0–10.5)
nRBC: 0 % (ref 0.0–0.2)

## 2021-02-11 LAB — URINALYSIS, ROUTINE W REFLEX MICROSCOPIC
Bilirubin Urine: NEGATIVE
Glucose, UA: >=500 mg/dL — AB
Hgb urine dipstick: NEGATIVE
Ketones, ur: 5 mg/dL — AB
Leukocytes,Ua: NEGATIVE
Nitrite: NEGATIVE
Protein, ur: >=300 mg/dL — AB
Specific Gravity, Urine: 1.02 (ref 1.005–1.030)
pH: 7 (ref 5.0–8.0)

## 2021-02-11 LAB — COMPREHENSIVE METABOLIC PANEL
ALT: 16 U/L (ref 0–44)
AST: 14 U/L — ABNORMAL LOW (ref 15–41)
Albumin: 2.5 g/dL — ABNORMAL LOW (ref 3.5–5.0)
Alkaline Phosphatase: 113 U/L (ref 38–126)
Anion gap: 9 (ref 5–15)
BUN: 10 mg/dL (ref 6–20)
CO2: 26 mmol/L (ref 22–32)
Calcium: 8.5 mg/dL — ABNORMAL LOW (ref 8.9–10.3)
Chloride: 99 mmol/L (ref 98–111)
Creatinine, Ser: 0.64 mg/dL (ref 0.44–1.00)
GFR, Estimated: >60 mL/min (ref >60)
Glucose, Bld: 273 mg/dL — ABNORMAL HIGH (ref 70–99)
Potassium: 3.8 mmol/L (ref 3.5–5.1)
Sodium: 134 mmol/L — ABNORMAL LOW (ref 135–145)
Total Bilirubin: 0.3 mg/dL (ref 0.3–1.2)
Total Protein: 6.4 g/dL — ABNORMAL LOW (ref 6.5–8.1)

## 2021-02-11 LAB — CBG MONITORING, ED: Glucose-Capillary: 397 mg/dL — ABNORMAL HIGH (ref 70–99)

## 2021-02-11 LAB — PREGNANCY, URINE: Preg Test, Ur: NEGATIVE

## 2021-02-11 NOTE — ED Notes (Signed)
Entered room and introduced self to patient. Pt appears to be resting in bed, respirations are even and unlabored with equal chest rise and fall. Bed is locked in the lowest position, side rails x2, call bell within reach. Pt educated on call light use and hourly rounding, verbalized understanding and in agreement at this time. All questions and concerns voiced addressed. Refreshments offered and provided per patient request.

## 2021-02-11 NOTE — ED Provider Notes (Signed)
[facility] EMERGENCY DEPARTMENT Provider Note   CSN: [id number] Arrival date & time: 02/11/21  1707     History Chief Complaint  Patient presents with  . Back Pain    Diamond [name] is a 25 y.o. female.  Pt complains of swelling in both legs and abdomen.   The history is provided by the patient. No language interpreter was used.  Abdominal Pain Pain location:  Generalized Pain quality: bloating   Timing:  Constant Progression:  Worsening Relieved by:  Nothing Worsened by:  Nothing Ineffective treatments:  None tried Pt reports she has increased swelling in her abdomen and her legs.  Pt reports her glucose has been between 200 and 300. Pt has chrons disease.     Past Medical History:  Diagnosis Date  . ADHD (attention deficit hyperactivity disorder) 2007  . Allergy   . Asthma   . Bipolar disorder ([place])   . Crohn's colitis ([place])    Dx Dr. Cherrie Gauze  . Depression    early childhood, trial of seroquel?  . Diabetic neuropathy ([place])   . Edema   . GERD (gastroesophageal reflux disease)   . Hypertension   . Pancreatitis   . Panic attacks   . PTSD (post-traumatic stress disorder)   . Stroke ([place]) 07/2020   R side  . Type 1 diabetes ([place]) 2008    Patient Active Problem List   Diagnosis Date Noted  . Bladder outlet obstruction   . Constipation   . Abdominal pain, epigastric   . Abnormal computed tomography of cecum and terminal ileum   . Hyperosmolar hyperglycemic state (HHS) ([place]) 01/30/2021  . Crohn's disease ([place]) 12/08/2020  . Gastroparesis 12/08/2020  . Essential hypertension, benign 11/01/2020  . Edema 10/19/2020  . Anasarca 10/18/2020  . Hypoalbuminemia 10/18/2020  . Personal history of noncompliance with medical treatment, presenting hazards to health 10/12/2020  . Bilateral lower extremity edema 08/25/2020  . Pneumonia 08/25/2020  . Eczema 10/23/2012  . Abdominal pain 08/27/2012  . GERD (gastroesophageal reflux disease) 07/25/2012  .  Mood disorder ([place]) 07/09/2012  . Contraception management 07/09/2012  . History of Crohn's disease 07/09/2012  . Uncontrolled type 1 diabetes mellitus with hyperglycemia ([place]) 07/08/2012  . Asthma, persistent controlled 07/08/2012    Past Surgical History:  Procedure Laterality Date  . ADENOIDECTOMY    . BIOPSY  02/01/2021   Procedure: BIOPSY;  Surgeon: Rogene Houston, MD;  [facility];  Service: Endoscopy;;  duodenal, esophageal,  . BIOPSY  02/03/2021   Procedure: BIOPSY;  Surgeon: Eloise Harman, DO;  Location[facility];  Service: Endoscopy;;  . COLONOSCOPY WITH PROPOFOL N/A 02/01/2021   Procedure: COLONOSCOPY WITH PROPOFOL;  Surgeon: Rogene Houston, MD;  Location[facility];  Service: Endoscopy;  Laterality: N/A;  . COLONOSCOPY WITH PROPOFOL N/A 02/03/2021   Procedure: COLONOSCOPY WITH PROPOFOL;  Surgeon: Eloise Harman, DO;  Location[facility];  Service: Endoscopy;  Laterality: N/A;  . ESOPHAGOGASTRODUODENOSCOPY (EGD) WITH PROPOFOL N/A 02/01/2021   Procedure: ESOPHAGOGASTRODUODENOSCOPY (EGD) WITH PROPOFOL;  Surgeon: Rogene Houston, MD;  [facility];  Service: Endoscopy;  Laterality: N/A;  . HERNIA REPAIR    . TONSILLECTOMY       OB History   No obstetric history on file.     Family History  Problem Relation Age of Onset  . Hypertension Mother   . Diabetes Mother   . Heart disease Mother   . Crohn's disease Father   .  Alcohol abuse Father   . Drug abuse Father   . Mental illness Father   . Cancer Father   . Hypertension Maternal Aunt   . Seizures Maternal Aunt   . Depression Maternal Aunt   . Asthma Maternal Uncle   . Hypertension Maternal Uncle   . Cancer Maternal Grandmother   . Diabetes Maternal Grandmother   . Mental illness Maternal Grandfather     Social History   Tobacco Use  . Smoking status: Never Smoker  . Smokeless tobacco: Never Used  Vaping Use  . Vaping Use: Former  Substance Use Topics  . Alcohol use:  No  . Drug use: No    Home Medications Prior to Admission medications   Medication Sig Start Date End Date Taking? Authorizing Provider  albuterol (VENTOLIN HFA) 108 (90 Base) MCG/ACT inhaler Inhale 2 puffs into the lungs every 6 (six) hours as needed. For shortness of breath Patient taking differently: Inhale 2 puffs into the lungs every 6 (six) hours as needed for shortness of breath or wheezing. For shortness of breath 10/21/20   Roxan Hockey, MD  alfuzosin (UROXATRAL) 10 MG 24 hr tablet Take 1 tablet (10 mg total) by mouth at bedtime. 02/04/21   Barton Dubois, MD  citalopram (CELEXA) 10 MG tablet Take 10 mg by mouth daily.    [provider]  furosemide (LASIX) 20 MG tablet Take 2 tablets (40 mg total) by mouth daily. 02/03/21   Barton Dubois, MD  gabapentin (NEURONTIN) 100 MG capsule Take 1 capsule (100 mg total) by mouth daily. 10/13/20   [name], PA-C  insulin glargine (LANTUS) 100 UNIT/ML injection Inject 0.3 mLs (30 Units total) into the skin daily. 02/03/21   Barton Dubois, MD  insulin lispro (HUMALOG KWIKPEN) 100 UNIT/ML KwikPen Inject 10-16 Units into the skin 3 (three) times daily before meals. Patient taking differently: Inject 10-16 Units into the skin 3 (three) times daily before meals. Per sliding scale 11/01/20   Cassandria Anger, MD  Insulin Pen Needle (PEN NEEDLES) 31G X 5 MM MISC To use with lantus. 01/27/21   Manuella Ghazi, Pratik D, DO  lisinopril (ZESTRIL) 20 MG tablet Take 1 tablet (20 mg total) by mouth daily. 11/01/20   Cassandria Anger, MD  metoCLOPramide (REGLAN) 5 MG tablet Take 1 tablet (5 mg total) by mouth every 8 (eight) hours as needed for refractory nausea / vomiting (refractory symptoms). 02/03/21 02/03/22  Barton Dubois, MD  ondansetron (ZOFRAN ODT) 8 MG disintegrating tablet Take 1 tablet (8 mg total) by mouth every 8 (eight) hours as needed for nausea or vomiting. 02/03/21   Barton Dubois, MD  pantoprazole (PROTONIX) 40 MG tablet Take 1  tablet (40 mg total) by mouth daily. 02/04/21   Barton Dubois, MD  potassium chloride (KLOR-CON) 10 MEQ tablet Take 1 tablet (10 mEq total) by mouth daily. Take While taking Lasix/furosemide 10/21/20   Roxan Hockey, MD  tamsulosin (FLOMAX) 0.4 MG CAPS capsule Take 1 capsule (0.4 mg total) by mouth daily. 12/13/20   McKenzie, Candee Furbish, MD  traZODone (DESYREL) 50 MG tablet Take 50 mg by mouth at bedtime.    [provider]    Allergies    Amoxicillin, Dexamethasone, Doxycycline, Penicillins, Augmentin [amoxicillin-pot clavulanate], Ceftriaxone sodium in dextrose, Clavulanic acid, Adhesive [tape], Latex, Other, and Pineapple  Review of Systems   Review of Systems  Gastrointestinal: Positive for abdominal pain.  All other systems reviewed and are negative.   Physical Exam Updated Vital Signs BP (!) 135/93 (  BP Location: Right Arm)   Pulse (!) 106   Temp 98.4 F (36.9 C) (Oral)   Resp 20   Ht 4' 11"  (1.499 m)   Wt 62.9 kg   SpO2 100%   BMI 28.01 kg/m   Physical Exam Vitals and nursing note reviewed.  Constitutional:      Appearance: She is well-developed.  HENT:     Head: Normocephalic and atraumatic.     Mouth/Throat:     Mouth: Mucous membranes are moist.  Eyes:     Extraocular Movements: Extraocular movements intact.     Pupils: Pupils are equal, round, and reactive to light.  Cardiovascular:     Rate and Rhythm: Normal rate.  Pulmonary:     Effort: Pulmonary effort is normal.  Abdominal:     General: There is distension.  Musculoskeletal:        General: Swelling and tenderness present. Normal range of motion.     Cervical back: Normal range of motion.  Skin:    General: Skin is warm.  Neurological:     General: No focal deficit present.     Mental Status: She is alert and oriented to person, place, and time.  Psychiatric:        Mood and Affect: Mood normal.     ED Results / Procedures / Treatments   Labs (all labs ordered are listed, but only  abnormal results are displayed) Labs Reviewed  CBG MONITORING, ED - Abnormal; Notable for the following components:      Result Value   Glucose-Capillary 397 (*)    All other components within normal limits  CBC WITH DIFFERENTIAL/PLATELET  COMPREHENSIVE METABOLIC PANEL  URINALYSIS, ROUTINE W REFLEX MICROSCOPIC  PREGNANCY, URINE    EKG None  Radiology No results found.  Procedures Procedures   Medications Ordered in ED Medications - No data to display  ED Course  I have reviewed the triage vital signs and the nursing notes.  Pertinent labs & imaging results that were available during my care of the patient were reviewed by me and considered in my medical decision making (see chart for details).    MDM Rules/Calculators/A&P                          MDM: Pt has a history of anasarca from proteinuria and hypoalbuminemia.  Labs ordered.  Pt's care turned over to Evalee Jefferson [facility] at 7pm Final Clinical Impression(s) / ED Diagnoses Final diagnoses:  None    Rx / DC Orders ED Discharge Orders    None       Sidney Ace 02/11/21 1938    Truddie Hidden, MD 02/12/21 [phone]

## 2021-02-11 NOTE — ED Notes (Signed)
ED Provider at bedside. 

## 2021-02-11 NOTE — ED Triage Notes (Addendum)
Pt c/o lower back pain,Leg pain with swelling, and a floater in her eye.

## 2021-02-12 MED ORDER — LIDOCAINE 5 % EX PTCH: 1.0000 | MEDICATED_PATCH | CUTANEOUS | 0 refills | Status: DC

## 2021-02-12 MED ORDER — LIDOCAINE 5 % EX PTCH
1.0000 | MEDICATED_PATCH | CUTANEOUS | Status: DC
  Administered 2021-02-12: 1 via TRANSDERMAL
  Filled 2021-02-12: qty 1

## 2021-02-12 MED ORDER — TRAMADOL HCL 50 MG PO TABS: 50.0000 mg | ORAL_TABLET | Freq: Four times a day (QID) | ORAL | 0 refills | Status: DC | PRN

## 2021-02-12 MED ORDER — TRAMADOL HCL 50 MG PO TABS
50.0000 mg | ORAL_TABLET | Freq: Once | ORAL | Status: AC
  Administered 2021-02-12: 50 mg via ORAL
  Filled 2021-02-12: qty 1

## 2021-02-12 NOTE — ED Provider Notes (Signed)
Pt presenting with low back pain, started when she woke this am and is worse with movement. Denies injury or falls.  Also reports chronic swelling in her legs which is worse today.  Reports compliance with her medications including her lisinopril and furosemide to help control her chronic anasarca, result of chronic hypoalbuminemia and proteinuria.  Presents with hyperglycemia today, but no dka, normal Co2 and anion gap. Denies sob, chest pain, dysuria. Labs reviewed and reassuring - however proteinuria persists, along with low albumin level.  She does not endorse low sodium diet which was recommended.    She was given short course of ultram for low back pain, recommended heat tx, lidoderm patch, close f/u with pcp for recheck this week.   The patient appears reasonably screened and/or stabilized for discharge and I doubt any other medical condition or other [facility] requiring further screening, evaluation, or treatment in the ED at this time prior to discharge.    Evalee Jefferson, PA-C 02/12/21 0037    Truddie Hidden, MD 02/12/21 [phone]

## 2021-02-12 NOTE — Discharge Instructions (Signed)
Your lab tests today are stable and you are not in DKA as discussed.  Your chronic swelling is from having a low protein level in your body (this is because you are losing a lot of protein in your urine).  It is very important for you to restrict your salt intake to help lessen this swelling - it is recommended that you consume less than 2000 mg of salt (sodium) daily - you will need to watch labels in the foods you eat.   Also keep a close watch on your blood glucose levels.  Continue taking your lisinopril to protect your kidneys and your fluid pill (furosemide).    Use the pain patch for your back pain. Stay as active as you comfortably can  to avoid your back pain getting worse.  A heating pad applied to your back can be helpful - I recommend 20 minutes 3 times daily.  You may take the ultram prescribed for pain relief.  This will make you drowsy - do not drive within 4 hours of taking this medication.  Plan to see your primary provider for a recheck of your symptoms this week.

## 2021-02-14 ENCOUNTER — Ambulatory Visit (HOSPITAL_COMMUNITY): Payer: Self-pay | Attending: Internal Medicine | Admitting: Physical Therapy

## 2021-02-14 ENCOUNTER — Other Ambulatory Visit: Payer: Self-pay

## 2021-02-14 ENCOUNTER — Encounter (HOSPITAL_COMMUNITY): Payer: Self-pay | Admitting: Physical Therapy

## 2021-02-14 DIAGNOSIS — R2689 Other abnormalities of gait and mobility: Secondary | ICD-10-CM | POA: Insufficient documentation

## 2021-02-14 DIAGNOSIS — M545 Low back pain, unspecified: Secondary | ICD-10-CM | POA: Insufficient documentation

## 2021-02-14 DIAGNOSIS — M6281 Muscle weakness (generalized): Secondary | ICD-10-CM | POA: Insufficient documentation

## 2021-02-14 NOTE — Therapy (Signed)
[facility] [address] [place], [place], [address] Phone: [phone]   Fax:  [phone]  Physical Therapy Evaluation  Patient Details  Name: Diamond [name] MRN: [id number] Date of Birth: May 07, 1996 Referring Provider (PT): Pratik Manuella Ghazi DO   Encounter Date: 02/14/2021   PT End of Session - 02/14/21 1025    Visit Number 1    Number of Visits 8    Date for PT Re-Evaluation 03/14/21    Authorization Type Self Pay    PT Start Time 2637   Arrived late   PT Stop Time 34    PT Time Calculation (min) 35 min    Equipment Utilized During Treatment Gait belt    Activity Tolerance Patient tolerated treatment well;Patient limited by fatigue    Behavior During Therapy [facility] for tasks assessed/performed           Past Medical History:  Diagnosis Date  . ADHD (attention deficit hyperactivity disorder) 2007  . Allergy   . Asthma   . Bipolar disorder ([place])   . Crohn's colitis ([place])    Dx Dr. Cherrie Gauze  . Depression    early childhood, trial of seroquel?  . Diabetic neuropathy ([place])   . Edema   . GERD (gastroesophageal reflux disease)   . Hypertension   . Pancreatitis   . Panic attacks   . PTSD (post-traumatic stress disorder)   . Stroke ([place]) 07/2020   R side  . Type 1 diabetes ([place]) 2008    Past Surgical History:  Procedure Laterality Date  . ADENOIDECTOMY    . BIOPSY  02/01/2021   Procedure: BIOPSY;  Surgeon: Rogene Houston, MD;  [facility];  Service: Endoscopy;;  duodenal, esophageal,  . BIOPSY  02/03/2021   Procedure: BIOPSY;  Surgeon: Eloise Harman, DO;  [facility];  Service: Endoscopy;;  . COLONOSCOPY WITH PROPOFOL N/A 02/01/2021   Procedure: COLONOSCOPY WITH PROPOFOL;  Surgeon: Rogene Houston, MD;  [facility];  Service: Endoscopy;  Laterality: N/A;  . COLONOSCOPY WITH PROPOFOL N/A 02/03/2021   Procedure: COLONOSCOPY WITH PROPOFOL;  Surgeon: Eloise Harman, DO;   [facility];  Service: Endoscopy;  Laterality: N/A;  . ESOPHAGOGASTRODUODENOSCOPY (EGD) WITH PROPOFOL N/A 02/01/2021   Procedure: ESOPHAGOGASTRODUODENOSCOPY (EGD) WITH PROPOFOL;  Surgeon: Rogene Houston, MD;  [facility];  Service: Endoscopy;  Laterality: N/A;  . HERNIA REPAIR    . TONSILLECTOMY      There were no vitals filed for this visit.    Subjective Assessment - 02/14/21 1002    Subjective Patient presents to therapy with complaint of leg weakness and LBP. She says she has trouble getting up out of bed and notes swelling in her legs that makes her walk funny. She says these issues began last year after she had COVID.    Limitations Standing;Walking;House hold activities    How long can you stand comfortably? 2 hours    How long can you walk comfortably? Unsure    Patient Stated Goals Get better    Currently in Pain? Yes    Pain Score 8     Pain Location Back    Pain Orientation Lower    Pain Descriptors / Indicators Pressure   "sitting there grinding"   Pain Type Chronic pain    Pain Onset More than a month ago    Pain Frequency Constant    Aggravating Factors  getting up, walking far  Pain Relieving Factors pain meds    Effect of Pain on Daily Activities Limits              [facility] PT Assessment - 02/14/21 0001      Assessment   Medical Diagnosis Uncontrolled Diabetes    Referring Provider (PT) Pratik Manuella Ghazi DO    Prior Therapy Inpatient      Precautions   Precautions Fall      Restrictions   Weight Bearing Restrictions No      Balance Screen   Has the patient fallen in the past 6 months Yes    How many times? "every day"    Has the patient had a decrease in activity level because of a fear of falling?  Yes    Is the patient reluctant to leave their home because of a fear of falling?  Yes      [place] residence    Living Arrangements Other relatives      Prior Function   Level of Independence  --   Her brother helps her with cooking, cleaning and dressing   Vocation Part time employment    Vocation Requirements SHEET[employer]      Cognition   Overall Cognitive Status Within Functional Limits for tasks assessed      ROM / Strength   AROM / PROM / Strength Strength      Strength   Strength Assessment Site Hip;Knee;Ankle    Right/Left Hip Right;Left    Right Hip Flexion 3+/5    Left Hip Flexion 3-/5    Right/Left Knee Right;Left    Right Knee Extension 3+/5    Left Knee Extension 3-/5    Right/Left Ankle Right;Left    Right Ankle Dorsiflexion 3+/5    Left Ankle Dorsiflexion 3+/5      Transfers   Transfers Sit to Stand    Sit to Stand 5: Supervision;With upper extremity assist    Comments slow, labored movement, heavy UE use      Ambulation/Gait   Ambulation/Gait Yes    Ambulation/Gait Assistance 5: Supervision    Ambulation Distance (Feet) 140 Feet    Assistive device None    Gait Pattern Decreased step length - right;Decreased step length - left;Decreased stance time - left;Decreased stride length;Decreased dorsiflexion - right;Decreased dorsiflexion - left;Antalgic;Shuffle;Poor foot clearance - left;Poor foot clearance - right;Wide base of support    Ambulation Surface Level;Indoor    Gait Comments 2MWT                      Objective measurements completed on examination: See above findings.               PT Education - 02/14/21 1005    Education Details on evaluation findings, POC and HEP. On use of AD for satbility with functional mobility to reduce falls    Person(s) Educated Patient    Methods Explanation    Comprehension Verbalized understanding            PT Short Term Goals - 02/14/21 1501      PT SHORT TERM GOAL #1   Title Patient will be independent with initial HEP and self-management strategies to improve functional outcomes    Time 2    Period Weeks    Status New    Target Date 02/28/21             PT Long Term  Goals - 02/14/21 1501  PT LONG TERM GOAL #1   Title Patient will be able to ambulate at least 300 feet during 2MWT with LRAD to demonstrate improved ability to perform functional mobility and associated tasks.    Time 4    Period Weeks    Status New    Target Date 03/14/21      PT LONG TERM GOAL #2   Title Patient will report at least 70% overall improvement in subjective complaint to indicate improvement in ability to perform ADLs.    Time 4    Period Weeks    Status New    Target Date 03/14/21      PT LONG TERM GOAL #3   Title Patient will be able to perform stand x 5 in < 25 seconds to demonstrate improvement in functional mobility and reduced risk for falls.                  Plan - 02/14/21 1026    Clinical Impression Statement Patient is a 25 y.o. female who presents to physical therapy with complaint of LBP, LT side weakness and falls. Patient demonstrates decreased strength, balance deficits and gait abnormalities which are negatively impacting patient ability to perform ADLs and functional mobility tasks. Patient will benefit from skilled physical therapy services to address these deficits to improve level of function with ADLs, functional mobility tasks, and reduce risk for falls.    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    Designer, jewellery Low    Rehab Potential Fair    PT Frequency 2x / week    PT Duration 4 weeks    PT Treatment/Interventions ADLs/Self Care Home Management;Aquatic Therapy;Biofeedback;Canalith Repostioning;Cryotherapy;Electrical Stimulation;Contrast Bath;Therapeutic exercise;Manual lymph drainage;Manual techniques;Functional  mobility training;Neuromuscular re-education;Scar mobilization;Passive range of motion;Balance training;DME Instruction;Gait training;Iontophoresis 76m/ml Dexamethasone;Stair training;Moist Heat;Traction;Ultrasound;Parrafin;Therapeutic activities;Fluidtherapy;Orthotic Fit/Training;Compression bandaging;Dry needling;Energy conservation;Splinting;Taping;Vasopneumatic Device;Other (comment);Joint Manipulations;Spinal Manipulations;Visual/perceptual remediation/compensation;Vestibular;Patient/family education    PT Next Visit Plan Review goals and HEP. Progress LE strength as tolerated. Seated>Standing> balance>Gait    PT Home Exercise Plan Eval: seated march, LAQs    Consulted and Agree with Plan of Care Patient           Patient will benefit from skilled therapeutic intervention in order to improve the following deficits and impairments:  Pain,Improper body mechanics,Abnormal gait,Decreased activity tolerance,Decreased balance,Decreased safety awareness,Difficulty walking,Decreased strength,Decreased endurance  Visit Diagnosis: Other abnormalities of gait and mobility  Muscle weakness (generalized)  Low back pain, unspecified back pain laterality, unspecified chronicity, unspecified whether sciatica present     Problem List Patient Active Problem List   Diagnosis Date Noted  . Bladder outlet obstruction   . Constipation   . Abdominal pain, epigastric   . Abnormal computed tomography of cecum and terminal ileum   . Hyperosmolar hyperglycemic state (HHS) ([place] 01/30/2021  . Crohn's disease ([place] 12/08/2020  . Gastroparesis 12/08/2020  . Essential hypertension, benign 11/01/2020  . Edema 10/19/2020  . Anasarca 10/18/2020  . Hypoalbuminemia 10/18/2020  . Personal history of noncompliance with medical treatment, presenting hazards to health 10/12/2020  . Bilateral lower extremity edema 08/25/2020  . Pneumonia 08/25/2020  . Eczema 10/23/2012  . Abdominal pain 08/27/2012  . GERD  (gastroesophageal reflux disease) 07/25/2012  . Mood disorder ([place] 07/09/2012  . Contraception management 07/09/2012  . History of Crohn's disease 07/09/2012  . Uncontrolled type  1 diabetes mellitus with hyperglycemia ([place]) 07/08/2012  . Asthma, persistent controlled 07/08/2012    4:03 PM, 02/14/21 Josue Hector PT DPT  Physical Therapist with [facility]  [phone] [place] [address] [place], [place], [address] Phone: [phone]   Fax:  [phone]  Name: ADDALYN SPEEDY MRN: [id number] Date of Birth: 1996/06/10

## 2021-02-15 ENCOUNTER — Encounter (HOSPITAL_COMMUNITY): Payer: Self-pay

## 2021-02-15 ENCOUNTER — Encounter: Payer: Self-pay | Admitting: Physician Assistant

## 2021-02-15 ENCOUNTER — Ambulatory Visit: Payer: Medicaid Other | Admitting: Physician Assistant

## 2021-02-15 ENCOUNTER — Ambulatory Visit (HOSPITAL_COMMUNITY): Payer: Self-pay

## 2021-02-15 VITALS — BP 134/91 | HR 107 | Temp 98.1°F | Wt 131.5 lb

## 2021-02-15 DIAGNOSIS — R609 Edema, unspecified: Secondary | ICD-10-CM

## 2021-02-15 DIAGNOSIS — R601 Generalized edema: Secondary | ICD-10-CM

## 2021-02-15 DIAGNOSIS — M545 Low back pain, unspecified: Secondary | ICD-10-CM

## 2021-02-15 DIAGNOSIS — M6281 Muscle weakness (generalized): Secondary | ICD-10-CM

## 2021-02-15 DIAGNOSIS — K50919 Crohn's disease, unspecified, with unspecified complications: Secondary | ICD-10-CM

## 2021-02-15 DIAGNOSIS — E1065 Type 1 diabetes mellitus with hyperglycemia: Secondary | ICD-10-CM

## 2021-02-15 DIAGNOSIS — R2689 Other abnormalities of gait and mobility: Secondary | ICD-10-CM

## 2021-02-15 DIAGNOSIS — N320 Bladder-neck obstruction: Secondary | ICD-10-CM

## 2021-02-15 MED ORDER — ONDANSETRON 8 MG PO TBDP
8.0000 mg | ORAL_TABLET | Freq: Three times a day (TID) | ORAL | 0 refills | Status: DC | PRN
Start: 2021-02-15 — End: 2021-03-10

## 2021-02-15 MED ORDER — FUROSEMIDE 20 MG PO TABS: 40.0000 mg | ORAL_TABLET | Freq: Every day | ORAL | 0 refills | Status: DC

## 2021-02-15 MED ORDER — ALFUZOSIN HCL ER 10 MG PO TB24: 10.0000 mg | ORAL_TABLET | Freq: Every day | ORAL | 0 refills | Status: DC

## 2021-02-15 MED ORDER — TAMSULOSIN HCL 0.4 MG PO CAPS: 0.4000 mg | ORAL_CAPSULE | Freq: Every day | ORAL | 0 refills | Status: DC

## 2021-02-15 MED ORDER — LISINOPRIL 20 MG PO TABS: 20.0000 mg | ORAL_TABLET | Freq: Every day | ORAL | 0 refills | Status: DC

## 2021-02-15 MED ORDER — ALBUTEROL SULFATE HFA 108 (90 BASE) MCG/ACT IN AERS
2.0000 | INHALATION_SPRAY | Freq: Four times a day (QID) | RESPIRATORY_TRACT | 0 refills | Status: DC | PRN
Start: 2021-02-15 — End: 2021-03-10

## 2021-02-15 MED ORDER — OMEPRAZOLE 40 MG PO CPDR: 40.0000 mg | DELAYED_RELEASE_CAPSULE | Freq: Every day | ORAL | 0 refills | Status: DC

## 2021-02-15 MED ORDER — POTASSIUM CHLORIDE ER 10 MEQ PO TBCR: 10.0000 meq | EXTENDED_RELEASE_TABLET | Freq: Every day | ORAL | 0 refills | Status: DC

## 2021-02-15 NOTE — Progress Notes (Signed)
BP (!) 134/91   Pulse (!) 107   Temp 98.1 F (36.7 C)   Wt 131 lb 8 oz (59.6 kg)   SpO2 96%   BMI 26.56 kg/m    Subjective:    Patient ID: Diamond [name], female    DOB: Oct 09, 1996, 25 y.o.   MRN: [id number]  HPI: Diamond [name] is a 25 y.o. female presenting on 02/15/2021 for No chief complaint on file.   HPI    Pt had a negative covid 19 screening questionnaire.    Pt is 25 y.o. female with history of DM type 1, bipolar disorder, anasarca, Crohn's Disease,and hypertension.    She didn't bring her medicines with her and she doesn't know what she is taking.  She has Not been seen in office since 12/05/20.  She was a no-show to her February appointment.  She is doing PT for her back and legs  Her DM is managed by endocrinology.  The Last time she went to endocrine was 12/07/20.  She has appointment to follow up there 03/08/21.    She says she is keeping bs log.   She does not have it with her.  She says it is Mostly in the 300s.   She says she had one low; she says it was 50-something.  She says it was just after she got off work.  She is back to working now.    She says she is still waiting on full medicaid.  She is followed by GI.  She recently had colonoscopy and EGD that showed Crohns, internal hemorrhoids, non-specific egd diagnosis.  She is not going to [facility] for her MH issues.  She says she went 2 times.  She says she Hasn't been there in a long time.   She has been to urologist and thinks she has appointment to follow up there in may.  She has had some problems with bladder obstruction.  For Contraception she is using abstainence  She says she is drinking protein shakes every day now to try to help her protein malnutrition.       Relevant past medical, surgical, family and social history reviewed and updated as indicated. Interim medical history since our last visit reviewed. Allergies and medications reviewed and updated.   Current Outpatient  Medications:  .  furosemide (LASIX) 20 MG tablet, Take 2 tablets (40 mg total) by mouth daily., Disp: , Rfl:  .  gabapentin (NEURONTIN) 100 MG capsule, Take 1 capsule (100 mg total) by mouth daily., Disp: 30 capsule, Rfl: 0 .  insulin glargine (LANTUS) 100 UNIT/ML injection, Inject 0.3 mLs (30 Units total) into the skin daily., Disp: , Rfl:  .  Insulin Glulisine (APIDRA [place]), Inject into the skin. ssi, Disp: , Rfl:  .  lisinopril (ZESTRIL) 20 MG tablet, Take 1 tablet (20 mg total) by mouth daily., Disp: 90 tablet, Rfl: 1 .  potassium chloride (KLOR-CON) 10 MEQ tablet, Take 1 tablet (10 mEq total) by mouth daily. Take While taking Lasix/furosemide, Disp: 30 tablet, Rfl: 2 .  albuterol (VENTOLIN HFA) 108 (90 Base) MCG/ACT inhaler, Inhale 2 puffs into the lungs every 6 (six) hours as needed. For shortness of breath (Patient not taking: Reported on 02/15/2021), Disp: 18 g, Rfl: 1 .  alfuzosin (UROXATRAL) 10 MG 24 hr tablet, Take 1 tablet (10 mg total) by mouth at bedtime. (Patient not taking: Reported on 02/15/2021), Disp: , Rfl:  .  citalopram (CELEXA) 10 MG tablet, Take 10 mg by mouth  daily. (Patient not taking: Reported on 02/15/2021), Disp: , Rfl:  .  lidocaine (LIDODERM) 5 %, Place 1 patch onto the skin daily. Remove & Discard patch within 12 hours or as directed by MD (Patient not taking: Reported on 02/15/2021), Disp: 30 patch, Rfl: 0 .  metoCLOPramide (REGLAN) 5 MG tablet, Take 1 tablet (5 mg total) by mouth every 8 (eight) hours as needed for refractory nausea / vomiting (refractory symptoms). (Patient not taking: Reported on 02/15/2021), Disp: 45 tablet, Rfl: 0 .  ondansetron (ZOFRAN ODT) 8 MG disintegrating tablet, Take 1 tablet (8 mg total) by mouth every 8 (eight) hours as needed for nausea or vomiting. (Patient not taking: Reported on 02/15/2021), Disp: 20 tablet, Rfl: 0 .  pantoprazole (PROTONIX) 40 MG tablet, Take 1 tablet (40 mg total) by mouth daily. (Patient not taking: Reported on 02/15/2021),  Disp: 30 tablet, Rfl: 1 .  tamsulosin (FLOMAX) 0.4 MG CAPS capsule, Take 1 capsule (0.4 mg total) by mouth daily. (Patient not taking: Reported on 02/15/2021), Disp: 30 capsule, Rfl: 0 .  traMADol (ULTRAM) 50 MG tablet, Take 1 tablet (50 mg total) by mouth every 6 (six) hours as needed. (Patient not taking: Reported on 02/15/2021), Disp: 10 tablet, Rfl: 0 .  traZODone (DESYREL) 50 MG tablet, Take 50 mg by mouth at bedtime. (Patient not taking: Reported on 02/15/2021), Disp: , Rfl:      Review of Systems  Per HPI unless specifically indicated above     Objective:    BP (!) 134/91   Pulse (!) 107   Temp 98.1 F (36.7 C)   Wt 131 lb 8 oz (59.6 kg)   SpO2 96%   BMI 26.56 kg/m   Wt Readings from Last 3 Encounters:  02/15/21 131 lb 8 oz (59.6 kg)  02/11/21 138 lb 10.7 oz (62.9 kg)  02/03/21 138 lb 10.7 oz (62.9 kg)    Physical Exam Constitutional:      General: She is not in acute distress.    Appearance: She is not toxic-appearing.     Comments: Pt spent most of appointment with her head on her arms on the countertop.    HENT:     Head: Normocephalic and atraumatic.  Cardiovascular:     Rate and Rhythm: Normal rate and regular rhythm.  Pulmonary:     Effort: Pulmonary effort is normal. No respiratory distress.     Breath sounds: Normal breath sounds. No wheezing or rhonchi.  Abdominal:     General: Bowel sounds are normal.     Palpations: Abdomen is soft.  Musculoskeletal:     Right lower leg: Edema present.     Left lower leg: Edema present.     Comments: Some weeping.  Edema up to knee.    Skin:    General: Skin is warm and dry.  Neurological:     Mental Status: She is oriented to person, place, and time and easily aroused. She is lethargic.  Psychiatric:        Attention and Perception: Attention normal.        Behavior: Behavior is cooperative.     Comments: Indifferent affect.  She does follow instruction when asked to sit on exam table, etc              Assessment & Plan:    Encounter Diagnoses  Name Primary?  Marland Kitchen Uncontrolled type 1 diabetes mellitus with hyperglycemia ([place]) Yes  . Anasarca   . Bladder outlet obstruction   . Crohn's  disease with complication, unspecified gastrointestinal tract location ([place])   . Edema, unspecified type      -recent labs reviewed.  No additional labs at this time.   -spent a long time going through pt's medication list with her as to where she could get each medication.  Most were sent to [facility].  she is to contact us if she hasn't gotten them 10-14 days  -Notes reviewed from urology. rx tamsulosin was sent to [facility] and pt was given coupon.  D/c alfuzosin as it appears to only have been prescribed as an alternative to tamsulosin.  Neither of these meds are available through [facility].  Discussed with pt that she will need to get ongoing prescriptions of this medication from her urologist  -She just recently picked up apidra and lantus.  She says she doesn't need more at this time  -discussed anasarca with Dr Florene Glen who agrees that pt would benefit from referral to nephrologist in light of her anasarca and complex history.  -encouraged pt to return to [facility].  She has history or bipolar disorder, panic attacks.  -discussed with pt that her taking active role in her care and being attentive to her medications, treatments, etc would improve her medical conditions  -pt encouraged to attend all specialist appointments and follow up with them as they recommend  -pt to follow up here 2 months.  She is to contact office sooner prn

## 2021-02-15 NOTE — Patient Instructions (Addendum)
Will need to get refills of tamsulosin from Dr Alyson Ingles (urologist)

## 2021-02-15 NOTE — Therapy (Signed)
[facility] [address] [place], [place], [address] Phone: [phone]   Fax:  [phone]  Physical Therapy Treatment  Patient Details  Name: Diamond [name] MRN: [id number] Date of Birth: 1996-10-05 Referring Provider (PT): Pratik Manuella Ghazi DO   Encounter Date: 02/15/2021   PT End of Session - 02/15/21 1021    Visit Number 2    Number of Visits 8    Date for PT Re-Evaluation 03/14/21    Authorization Type Self Pay    PT Start Time 1003    PT Stop Time 1043    PT Time Calculation (min) 40 min    Equipment Utilized During Treatment Gait belt    Activity Tolerance Patient tolerated treatment well;Patient limited by fatigue;Patient limited by pain    Behavior During Therapy [facility] for tasks assessed/performed           Past Medical History:  Diagnosis Date  . ADHD (attention deficit hyperactivity disorder) 2007  . Allergy   . Asthma   . Bipolar disorder ([place])   . Crohn's colitis ([place])    Dx Dr. Cherrie Gauze  . Depression    early childhood, trial of seroquel?  . Diabetic neuropathy ([place])   . Edema   . GERD (gastroesophageal reflux disease)   . Hypertension   . Pancreatitis   . Panic attacks   . PTSD (post-traumatic stress disorder)   . Stroke ([place]) 07/2020   R side  . Type 1 diabetes ([place]) 2008    Past Surgical History:  Procedure Laterality Date  . ADENOIDECTOMY    . BIOPSY  02/01/2021   Procedure: BIOPSY;  Surgeon: Rogene Houston, MD;  [facility];  Service: Endoscopy;;  duodenal, esophageal,  . BIOPSY  02/03/2021   Procedure: BIOPSY;  Surgeon: Eloise Harman, DO;  [facility];  Service: Endoscopy;;  . COLONOSCOPY WITH PROPOFOL N/A 02/01/2021   Procedure: COLONOSCOPY WITH PROPOFOL;  Surgeon: Rogene Houston, MD;  [facility];  Service: Endoscopy;  Laterality: N/A;  . COLONOSCOPY WITH PROPOFOL N/A 02/03/2021   Procedure: COLONOSCOPY WITH PROPOFOL;  Surgeon: Eloise Harman, DO;   [facility];  Service: Endoscopy;  Laterality: N/A;  . ESOPHAGOGASTRODUODENOSCOPY (EGD) WITH PROPOFOL N/A 02/01/2021   Procedure: ESOPHAGOGASTRODUODENOSCOPY (EGD) WITH PROPOFOL;  Surgeon: Rogene Houston, MD;  [facility];  Service: Endoscopy;  Laterality: N/A;  . HERNIA REPAIR    . TONSILLECTOMY      There were no vitals filed for this visit.   Subjective Assessment - 02/15/21 1005    Subjective Pt stated she fell yesterday going up stairs.  Reports she has a constant sharp pain BLE, "feels like a heart beat".  Continues to have swelling of legs and stomach.    Patient Stated Goals Get better    Currently in Pain? Yes    Pain Score 6     Pain Location Leg    Pain Orientation Right;Left    Pain Descriptors / Indicators Sharp    Pain Type Chronic pain    Aggravating Factors  getting up, walking far    Pain Relieving Factors Tylenol    Effect of Pain on Daily Activities Limits                             [facility] Adult PT Treatment/Exercise - 02/15/21 0001      Bed Mobility   Bed  Mobility Right Sidelying to Sit;Sit to Sidelying Right    Right Sidelying to Sit Supervision/Verbal cueing;Minimal Assistance - Patient > 75%    Sit to Sidelying Right Supervision/Verbal cueing      Ambulation/Gait   Ambulation/Gait Yes    Ambulation/Gait Assistance 5: Supervision    Ambulation Distance (Feet) 40 Feet    Assistive device Rolling walker    Gait Pattern Decreased step length - right;Decreased step length - left;Decreased stance time - left;Decreased stride length;Decreased dorsiflexion - right;Decreased dorsiflexion - left;Antalgic;Shuffle;Poor foot clearance - left;Poor foot clearance - right;Wide base of support    Ambulation Surface Level;Indoor      Exercises   Exercises Knee/Hip      Knee/Hip Exercises: Standing   Heel Raises Limitations 6 reps, limited by LBP and fatigue      Knee/Hip Exercises: Seated   Long Arc Quad 2 sets;5 reps     Marching 2 sets;5 reps      Knee/Hip Exercises: Supine   Bridges 10 reps    Bridges Limitations glut sets                  PT Education - 02/15/21 1021    Education Details Reviewed goals, educated importance of HEP complaince for maximal benefits.  Educated need for AD for stability and functional mobility to reduce falls.  Discussed beneftis with compression garments for edema control, measurements taken and paperwork given for capris and knee high.    Person(s) Educated Patient    Methods Explanation;Demonstration;Handout    Comprehension Verbalized understanding;Returned demonstration            PT Short Term Goals - 02/14/21 1501      PT SHORT TERM GOAL #1   Title Patient will be independent with initial HEP and self-management strategies to improve functional outcomes    Time 2    Period Weeks    Status New    Target Date 02/28/21             PT Long Term Goals - 02/14/21 1501      PT LONG TERM GOAL #1   Title Patient will be able to ambulate at least 300 feet during 2MWT with LRAD to demonstrate improved ability to perform functional mobility and associated tasks.    Time 4    Period Weeks    Status New    Target Date 03/14/21      PT LONG TERM GOAL #2   Title Patient will report at least 70% overall improvement in subjective complaint to indicate improvement in ability to perform ADLs.    Time 4    Period Weeks    Status New    Target Date 03/14/21      PT LONG TERM GOAL #3   Title Patient will be able to perform stand x 5 in < 25 seconds to demonstrate improvement in functional mobility and reduced risk for falls.                 Plan - 02/15/21 1336    Clinical Impression Statement Reviewed goals, educated importance of HEP compliance.  Pt able to recall LAQ exercise, educated for full range and slow control for maximal strengthening benefits.  Pt with edema present BLE and abdominal region.  Educated on beneftis with compression garments  including capris, knee/thigh length, paperwork given for capris, Elastic Therapy as well as places to get measured in person.  Pt ambulates with slow cadence and decreased clearance especially wiht Lt  LE which increases her risk of falling.  Educated on benefits on use of AD, will need further training and discussion wiht proper gait mechanics in the future.  Pt reports increased pain following heel raises.  Easily fatigued required seated rest breaks.  Also instructed log rolling techniques to improve bed mobility independently.    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    Designer, jewellery Low    Rehab Potential Fair    PT Frequency 2x / week    PT Duration 4 weeks    PT Treatment/Interventions ADLs/Self Care Home Management;Aquatic Therapy;Biofeedback;Canalith Repostioning;Cryotherapy;Electrical Stimulation;Contrast Bath;Therapeutic exercise;Manual lymph drainage;Manual techniques;Functional mobility training;Neuromuscular re-education;Scar mobilization;Passive range of motion;Balance training;DME Instruction;Gait training;Iontophoresis 62m/ml Dexamethasone;Stair training;Moist Heat;Traction;Ultrasound;Parrafin;Therapeutic activities;Fluidtherapy;Orthotic Fit/Training;Compression bandaging;Dry needling;Energy conservation;Splinting;Taping;Vasopneumatic Device;Other (comment);Joint Manipulations;Spinal Manipulations;Visual/perceptual remediation/compensation;Vestibular;Patient/family education    PT Next Visit Plan Follow up with compression garments and AD. Progress LE strength as tolerated. Seated>Standing> balance>Gait    PT Home Exercise Plan Eval: seated march, LAQs    Consulted and  Agree with Plan of Care Patient           Patient will benefit from skilled therapeutic intervention in order to improve the following deficits and impairments:  Pain,Improper body mechanics,Abnormal gait,Decreased activity tolerance,Decreased balance,Decreased safety awareness,Difficulty walking,Decreased strength,Decreased endurance  Visit Diagnosis: Other abnormalities of gait and mobility  Muscle weakness (generalized)  Low back pain, unspecified back pain laterality, unspecified chronicity, unspecified whether sciatica present     Problem List Patient Active Problem List   Diagnosis Date Noted  . Bladder outlet obstruction   . Constipation   . Abdominal pain, epigastric   . Abnormal computed tomography of cecum and terminal ileum   . Hyperosmolar hyperglycemic state (HHS) ([place] 01/30/2021  . Crohn's disease ([place] 12/08/2020  . Gastroparesis 12/08/2020  . Essential hypertension, benign 11/01/2020  . Edema 10/19/2020  . Anasarca 10/18/2020  . Hypoalbuminemia 10/18/2020  . Personal history of noncompliance with medical treatment, presenting hazards to health 10/12/2020  . Bilateral lower extremity edema 08/25/2020  . Pneumonia 08/25/2020  . Eczema 10/23/2012  . Abdominal pain 08/27/2012  . GERD (gastroesophageal reflux disease) 07/25/2012  . Mood disorder ([place] 07/09/2012  . Contraception management 07/09/2012  . History of Crohn's disease 07/09/2012  . Uncontrolled type 1 diabetes mellitus with hyperglycemia ([place] 07/08/2012  . Asthma, persistent controlled 07/08/2012   CIhor Austin LPTA/CLT; CBIS [phone] CAldona Lento3/16/2022, 1:43 PM  [place] [place] [address]Phone: [phone]  Fax:  [phone] Name: Diamond BLACKSTOCKMRN: [id number] of Birth: 5July 25, 1997

## 2021-02-22 ENCOUNTER — Other Ambulatory Visit: Payer: Self-pay

## 2021-02-22 ENCOUNTER — Encounter (HOSPITAL_COMMUNITY): Payer: Self-pay

## 2021-02-22 ENCOUNTER — Ambulatory Visit (HOSPITAL_COMMUNITY): Payer: Self-pay

## 2021-02-22 DIAGNOSIS — M6281 Muscle weakness (generalized): Secondary | ICD-10-CM

## 2021-02-22 DIAGNOSIS — M545 Low back pain, unspecified: Secondary | ICD-10-CM

## 2021-02-22 DIAGNOSIS — R2689 Other abnormalities of gait and mobility: Secondary | ICD-10-CM

## 2021-02-22 NOTE — Therapy (Signed)
[facility] [address] [place], [place], [address] Phone: [phone]   Fax:  [phone]  Physical Therapy Treatment  Patient Details  Name: Diamond [name] MRN: [id number] Date of Birth: 1996/07/31 Referring Provider (PT): Pratik Manuella Ghazi DO   Encounter Date: 02/22/2021   PT End of Session - 02/22/21 0948    Visit Number 3    Number of Visits 8    Date for PT Re-Evaluation 03/14/21    Authorization Type Self Pay    PT Start Time 0945    PT Stop Time 1030    PT Time Calculation (min) 45 min    Equipment Utilized During Treatment Gait belt    Activity Tolerance Patient tolerated treatment well;Patient limited by fatigue;Patient limited by pain    Behavior During Therapy [facility] for tasks assessed/performed           Past Medical History:  Diagnosis Date  . ADHD (attention deficit hyperactivity disorder) 2007  . Allergy   . Asthma   . Bipolar disorder ([place])   . Crohn's colitis ([place])    Dx Dr. Cherrie Gauze  . Depression    early childhood, trial of seroquel?  . Diabetic neuropathy ([place])   . Edema   . GERD (gastroesophageal reflux disease)   . Hypertension   . Pancreatitis   . Panic attacks   . PTSD (post-traumatic stress disorder)   . Stroke ([place]) 07/2020   R side  . Type 1 diabetes ([place]) 2008    Past Surgical History:  Procedure Laterality Date  . ADENOIDECTOMY    . BIOPSY  02/01/2021   Procedure: BIOPSY;  Surgeon: Rogene Houston, MD;  [facility];  Service: Endoscopy;;  duodenal, esophageal,  . BIOPSY  02/03/2021   Procedure: BIOPSY;  Surgeon: Eloise Harman, DO;  [facility];  Service: Endoscopy;;  . COLONOSCOPY WITH PROPOFOL N/A 02/01/2021   Procedure: COLONOSCOPY WITH PROPOFOL;  Surgeon: Rogene Houston, MD;  [facility];  Service: Endoscopy;  Laterality: N/A;  . COLONOSCOPY WITH PROPOFOL N/A 02/03/2021   Procedure: COLONOSCOPY WITH PROPOFOL;  Surgeon: Eloise Harman, DO;   [facility];  Service: Endoscopy;  Laterality: N/A;  . ESOPHAGOGASTRODUODENOSCOPY (EGD) WITH PROPOFOL N/A 02/01/2021   Procedure: ESOPHAGOGASTRODUODENOSCOPY (EGD) WITH PROPOFOL;  Surgeon: Rogene Houston, MD;  [facility];  Service: Endoscopy;  Laterality: N/A;  . HERNIA REPAIR    . TONSILLECTOMY      There were no vitals filed for this visit.   Subjective Assessment - 02/22/21 0953    Subjective Pt reports additional fall down the stairs two days ago where she reports landing on her knee and feels increased knee pain since this incident and notes continued swelling in BLE and her abdomen    Limitations Standing;Walking;House hold activities    Patient Stated Goals Get better    Currently in Pain? Yes    Pain Score 7     Pain Location Leg    Pain Orientation Right;Left    Pain Descriptors / Indicators Sharp;Aching    Pain Type Chronic pain    Pain Onset More than a month ago    Pain Frequency Constant                             [facility] Adult PT Treatment/Exercise - 02/22/21 0001      Ambulation/Gait   Ambulation/Gait Yes  Ambulation/Gait Assistance 4: Min guard    Ambulation Distance (Feet) 125 Feet    Assistive device Straight cane    Gait Pattern Decreased step length - right;Decreased step length - left;Decreased stance time - left;Decreased stride length;Decreased dorsiflexion - right;Decreased dorsiflexion - left;Antalgic;Shuffle;Poor foot clearance - left;Poor foot clearance - right;Wide base of support    Ambulation Surface Level;Indoor      Knee/Hip Exercises: Supine   Quad Sets Strengthening;Right;3 sets;10 reps    Short Arc Quad Sets Strengthening;Left;3 sets;10 reps    Heel Slides AAROM;Right;3 sets;10 reps    Other Supine Knee/Hip Exercises ankle pumps 3x10    Other Supine Knee/Hip Exercises ball squeeze 3x10. AAROM lLLE heel slides 3x10. Hip fall-outs 3x10                  PT Education - 02/22/21 0955     Education Details Pt education on canvasing local thrift stores to acquire a single point cane to use of ambulation. Education/training on sequence with cane    Person(s) Educated Patient    Methods Explanation;Demonstration    Comprehension Verbalized understanding;Returned demonstration            PT Short Term Goals - 02/14/21 1501      PT SHORT TERM GOAL #1   Title Patient will be independent with initial HEP and self-management strategies to improve functional outcomes    Time 2    Period Weeks    Status New    Target Date 02/28/21             PT Long Term Goals - 02/14/21 1501      PT LONG TERM GOAL #1   Title Patient will be able to ambulate at least 300 feet during 2MWT with LRAD to demonstrate improved ability to perform functional mobility and associated tasks.    Time 4    Period Weeks    Status New    Target Date 03/14/21      PT LONG TERM GOAL #2   Title Patient will report at least 70% overall improvement in subjective complaint to indicate improvement in ability to perform ADLs.    Time 4    Period Weeks    Status New    Target Date 03/14/21      PT LONG TERM GOAL #3   Title Patient will be able to perform stand x 5 in < 25 seconds to demonstrate improvement in functional mobility and reduced risk for falls.                 Plan - 02/22/21 0956    Clinical Impression Statement Pt presents with difficulty in walking requiring wide BOS and limited knee flexion and limb advancement ambulating with unsteady pattern and very poor activity tolerance requiring frequent rest periods with light exercise/activity. Continued POC indicated to improve ambulation and balance to reduce risk for falls    Personal Factors and Comorbidities Comorbidity 3+    Comorbidities HTN, DM I, CVA (April 2021)    Examination-Activity Limitations Bathing;Lift;Stand;Locomotion Level;Toileting;Transfers;Stairs    Examination-Participation Restrictions  Laundry;Occupation;Community Activity;Cleaning;Yard Work    Stability/Clinical Decision Making Stable/Uncomplicated    Rehab Potential Fair    PT Frequency 2x / week    PT Duration 4 weeks    PT Treatment/Interventions ADLs/Self Care Home Management;Aquatic Therapy;Biofeedback;Canalith Repostioning;Cryotherapy;Electrical Stimulation;Contrast Bath;Therapeutic exercise;Manual lymph drainage;Manual techniques;Functional mobility training;Neuromuscular re-education;Scar mobilization;Passive range of motion;Balance training;DME Instruction;Gait training;Iontophoresis 69m/ml Dexamethasone;Stair training;Moist Heat;Traction;Ultrasound;Parrafin;Therapeutic activities;Fluidtherapy;Orthotic Fit/Training;Compression bandaging;Dry needling;Energy conservation;Splinting;Taping;Vasopneumatic Device;Other (comment);Joint Manipulations;Spinal Manipulations;Visual/perceptual  remediation/compensation;Vestibular;Patient/family education    PT Next Visit Plan Follow up with compression garments and AD. Progress LE strength as tolerated. Seated>Standing> balance>Gait    PT Home Exercise Plan Eval: seated march, LAQs    Consulted and Agree with Plan of Care Patient           Patient will benefit from skilled therapeutic intervention in order to improve the following deficits and impairments:  Pain,Improper body mechanics,Abnormal gait,Decreased activity tolerance,Decreased balance,Decreased safety awareness,Difficulty walking,Decreased strength,Decreased endurance  Visit Diagnosis: Other abnormalities of gait and mobility  Muscle weakness (generalized)  Low back pain, unspecified back pain laterality, unspecified chronicity, unspecified whether sciatica present     Problem List Patient Active Problem List   Diagnosis Date Noted  . Bladder outlet obstruction   . Constipation   . Abdominal pain, epigastric   . Abnormal computed tomography of cecum and terminal ileum   . Hyperosmolar hyperglycemic state  (HHS) ([place]) 01/30/2021  . Crohn's disease ([place]) 12/08/2020  . Gastroparesis 12/08/2020  . Essential hypertension, benign 11/01/2020  . Edema 10/19/2020  . Anasarca 10/18/2020  . Hypoalbuminemia 10/18/2020  . Personal history of noncompliance with medical treatment, presenting hazards to health 10/12/2020  . Bilateral lower extremity edema 08/25/2020  . Pneumonia 08/25/2020  . Eczema 10/23/2012  . Abdominal pain 08/27/2012  . GERD (gastroesophageal reflux disease) 07/25/2012  . Mood disorder ([place]) 07/09/2012  . Contraception management 07/09/2012  . History of Crohn's disease 07/09/2012  . Uncontrolled type 1 diabetes mellitus with hyperglycemia ([place]) 07/08/2012  . Asthma, persistent controlled 07/08/2012   10:25 AM, 02/22/21 M. Sherlyn Lees, PT, DPT Physical Therapist- [place] Office Number: [phone]  [facility] [address] [place], [place], [address] Phone: [phone]   Fax:  [phone]  Name: Diamond [name] MRN: [id number] Date of Birth: September 12, 1996

## 2021-02-23 ENCOUNTER — Ambulatory Visit (HOSPITAL_COMMUNITY): Payer: Self-pay

## 2021-02-23 ENCOUNTER — Encounter (HOSPITAL_COMMUNITY): Payer: Self-pay

## 2021-02-23 DIAGNOSIS — E119 Type 2 diabetes mellitus without complications: Secondary | ICD-10-CM | POA: Insufficient documentation

## 2021-02-23 DIAGNOSIS — M545 Low back pain, unspecified: Secondary | ICD-10-CM

## 2021-02-23 DIAGNOSIS — R2689 Other abnormalities of gait and mobility: Secondary | ICD-10-CM

## 2021-02-23 DIAGNOSIS — M6281 Muscle weakness (generalized): Secondary | ICD-10-CM

## 2021-02-23 NOTE — Congregational Nurse Program (Signed)
Late Entry for 02/22/21  [name] of [facility] stopped by Reva Bores. Client Diamond [name], "dropped her glucose meter in the toilet" 1 trumetrix meter given to send to client by [name] 2 50 count trumetrix strips 1 100 count box of lancets and 3 glucose log sheets.  Will plan follow up call to client 02/23/21 as client as not exhibited engagement in participating in her own care.  Client has upcoming appointment with [name] 03/08/21 Have continued to discuss client's needs to monitor her blood sugars and follow the plan of care for her sliding scale coverage by Dr. Dorris Fetch and stress that she needs to monitor before taking insulin as well as follow a regular meal routine.  Will emphasize in follow up call 02/23/21  [name]

## 2021-02-23 NOTE — Therapy (Signed)
[facility] [address] [place], [place], [address] Phone: [phone]   Fax:  [phone]  Physical Therapy Treatment  Patient Details  Name: Diamond [name] MRN: [id number] Date of Birth: 02-22-1996 Referring Provider (PT): Pratik Manuella Ghazi DO   Encounter Date: 02/23/2021   PT End of Session - 02/23/21 0836    Visit Number 4    Number of Visits 8    Date for PT Re-Evaluation 03/14/21    Authorization Type Self Pay    PT Start Time 0846    PT Stop Time 0935    PT Time Calculation (min) 49 min    Equipment Utilized During Treatment Gait belt    Activity Tolerance Patient tolerated treatment well;Patient limited by fatigue;Patient limited by pain    Behavior During Therapy [facility] for tasks assessed/performed           Past Medical History:  Diagnosis Date  . ADHD (attention deficit hyperactivity disorder) 2007  . Allergy   . Asthma   . Bipolar disorder ([place])   . Crohn's colitis ([place])    Dx Dr. Cherrie Gauze  . Depression    early childhood, trial of seroquel?  . Diabetic neuropathy ([place])   . Edema   . GERD (gastroesophageal reflux disease)   . Hypertension   . Pancreatitis   . Panic attacks   . PTSD (post-traumatic stress disorder)   . Stroke ([place]) 07/2020   R side  . Type 1 diabetes ([place]) 2008    Past Surgical History:  Procedure Laterality Date  . ADENOIDECTOMY    . BIOPSY  02/01/2021   Procedure: BIOPSY;  Surgeon: Rogene Houston, MD;  [facility];  Service: Endoscopy;;  duodenal, esophageal,  . BIOPSY  02/03/2021   Procedure: BIOPSY;  Surgeon: Eloise Harman, DO;  [facility];  Service: Endoscopy;;  . COLONOSCOPY WITH PROPOFOL N/A 02/01/2021   Procedure: COLONOSCOPY WITH PROPOFOL;  Surgeon: Rogene Houston, MD;  [facility];  Service: Endoscopy;  Laterality: N/A;  . COLONOSCOPY WITH PROPOFOL N/A 02/03/2021   Procedure: COLONOSCOPY WITH PROPOFOL;  Surgeon: Eloise Harman, DO;   [facility];  Service: Endoscopy;  Laterality: N/A;  . ESOPHAGOGASTRODUODENOSCOPY (EGD) WITH PROPOFOL N/A 02/01/2021   Procedure: ESOPHAGOGASTRODUODENOSCOPY (EGD) WITH PROPOFOL;  Surgeon: Rogene Houston, MD;  [facility];  Service: Endoscopy;  Laterality: N/A;  . HERNIA REPAIR    . TONSILLECTOMY      There were no vitals filed for this visit.   Subjective Assessment - 02/23/21 0835    Subjective Patient reports pain in lower back and right lower thigh 8/10. Has been using SPC but not sure she is steady with it. No more falls since last visit. States she works at [employer] am to 10:00 pm today. Works 5 days a week as Immunologist. Aunt takes patient to work.    Limitations Standing;Walking;House hold activities    Patient Stated Goals Get better    Currently in Pain? Yes    Pain Score 8     Pain Onset More than a month ago            [facility] Adult PT Treatment/Exercise - 02/23/21 0001      Ambulation/Gait   Ambulation/Gait Yes    Ambulation/Gait Assistance 4: Min guard    Ambulation Distance (Feet) 70 Feet   35x2   Assistive device Straight cane    Gait Pattern Decreased  step length - right;Decreased step length - left;Decreased stance time - left;Decreased stride length;Decreased dorsiflexion - right;Decreased dorsiflexion - left;Antalgic;Shuffle;Poor foot clearance - left;Poor foot clearance - right;Wide base of support    Ambulation Surface Level;Indoor      Knee/Hip Exercises: Supine   Short Arc Quad Sets Strengthening;Both;15 reps;2 sets    Heel Slides AAROM;Right;10 reps;2 sets    Other Supine Knee/Hip Exercises ankle pumps 2x15    Other Supine Knee/Hip Exercises ball squeeze 2x10. AAROM lLLE heel slides 2x10.            PT Education - 02/23/21 0913    Education Details Disacussed purpose and technique of interventions throughout session. Advanced HEP.    Person(s) Educated Patient    Methods Explanation    Comprehension Verbalized  understanding            PT Short Term Goals - 02/23/21 0837      PT SHORT TERM GOAL #1   Title Patient will be independent with initial HEP and self-management strategies to improve functional outcomes    Time 2    Period Weeks    Status On-going    Target Date 02/28/21             PT Long Term Goals - 02/23/21 0837      PT LONG TERM GOAL #1   Title Patient will be able to ambulate at least 300 feet during 2MWT with LRAD to demonstrate improved ability to perform functional mobility and associated tasks.    Time 4    Period Weeks    Status On-going      PT LONG TERM GOAL #2   Title Patient will report at least 70% overall improvement in subjective complaint to indicate improvement in ability to perform ADLs.    Time 4    Period Weeks    Status On-going      PT LONG TERM GOAL #3   Title Patient will be able to perform stand x 5 in < 25 seconds to demonstrate improvement in functional mobility and reduced risk for falls.    Status On-going            Plan - 02/23/21 5809    Clinical Impression Statement Re-measured for thigh high compression stockings as patient reports her brother threw away the measurement sheet given to her a few sessions ago. Patient presents with difficulty in walking requiring wide BOS, reaching for objects along the path and limited knee flexion and limb advancement ambulating with unsteady pattern and very poor activity tolerance requiring frequent rest periods with light exercise/activity. Complaints of LBP centrally and right lower thigh with therapeutic exercises. Patient reported 10+/10 pain at end of session. Continued POC indicated to improve ambulation and balance to reduce risk for falls.    Personal Factors and Comorbidities Comorbidity 3+    Comorbidities HTN, DM I, CVA (April 2021)    Examination-Activity Limitations Bathing;Lift;Stand;Locomotion Level;Toileting;Transfers;Stairs    Examination-Participation Restrictions  Laundry;Occupation;Community Activity;Cleaning;Yard Work    Stability/Clinical Decision Making Stable/Uncomplicated    Rehab Potential Fair    PT Frequency 2x / week    PT Duration 4 weeks    PT Treatment/Interventions ADLs/Self Care Home Management;Aquatic Therapy;Biofeedback;Canalith Repostioning;Cryotherapy;Electrical Stimulation;Contrast Bath;Therapeutic exercise;Manual lymph drainage;Manual techniques;Functional mobility training;Neuromuscular re-education;Scar mobilization;Passive range of motion;Balance training;DME Instruction;Gait training;Iontophoresis 62m/ml Dexamethasone;Stair training;Moist Heat;Traction;Ultrasound;Parrafin;Therapeutic activities;Fluidtherapy;Orthotic Fit/Training;Compression bandaging;Dry needling;Energy conservation;Splinting;Taping;Vasopneumatic Device;Other (comment);Joint Manipulations;Spinal Manipulations;Visual/perceptual remediation/compensation;Vestibular;Patient/family education    PT Next Visit Plan Follow up with compression garments and AD. Progress LE strength as  tolerated. Seated>Standing> balance>Gait    PT Home Exercise Plan Eval: seated march, LAQs; 02/23/21 - QS, ab set, SAQ, ankle pump    Consulted and Agree with Plan of Care Patient           Patient will benefit from skilled therapeutic intervention in order to improve the following deficits and impairments:  Pain,Improper body mechanics,Abnormal gait,Decreased activity tolerance,Decreased balance,Decreased safety awareness,Difficulty walking,Decreased strength,Decreased endurance  Visit Diagnosis: Other abnormalities of gait and mobility  Muscle weakness (generalized)  Low back pain, unspecified back pain laterality, unspecified chronicity, unspecified whether sciatica present     Problem List Patient Active Problem List   Diagnosis Date Noted  . Bladder outlet obstruction   . Constipation   . Abdominal pain, epigastric   . Abnormal computed tomography of cecum and terminal ileum    . Hyperosmolar hyperglycemic state (HHS) ([place]) 01/30/2021  . Crohn's disease ([place]) 12/08/2020  . Gastroparesis 12/08/2020  . Essential hypertension, benign 11/01/2020  . Edema 10/19/2020  . Anasarca 10/18/2020  . Hypoalbuminemia 10/18/2020  . Personal history of noncompliance with medical treatment, presenting hazards to health 10/12/2020  . Bilateral lower extremity edema 08/25/2020  . Pneumonia 08/25/2020  . Eczema 10/23/2012  . Abdominal pain 08/27/2012  . GERD (gastroesophageal reflux disease) 07/25/2012  . Mood disorder ([place]) 07/09/2012  . Contraception management 07/09/2012  . History of Crohn's disease 07/09/2012  . Uncontrolled type 1 diabetes mellitus with hyperglycemia ([place]) 07/08/2012  . Asthma, persistent controlled 07/08/2012   Floria Raveling. Hartnett-Rands, MS, PT [facility] [phone]  Jeannie Done 02/23/2021, 10:50 AM  [facility] [address] [place], [place], [address] Phone: [phone]   Fax:  [phone]  Name: Diamond [name] MRN: [id number] Date of Birth: 15-Dec-1995

## 2021-02-23 NOTE — Patient Instructions (Signed)
Isometric Abdominal    Lying on back with knees bent, tighten stomach by pressing elbows down. Hold _3-5_ seconds. Repeat 10-15 times per set. Do _1_ sets per session. Do _1_ sessions per day.  http://orth.exer.us/1087   Copyright  VHI. All rights reserved.   Quad Set    With other leg bent, foot flat, slowly tighten muscles on thigh of straight leg while counting out loud to _5_. Repeat with other leg. Repeat _10-15_ times. Do 1_ sessions per day.  http://gt2.exer.us/276   Copyright  VHI. All rights reserved.   Short Arc [employer] a large can or rolled towel under left leg. Straighten leg. Hold _3-5_ seconds. Repeat _10-15_ times. Do _1__ sessions per day.  http://gt2.exer.us/299   Copyright  VHI. All rights reserved.   Ankle Pump    Bend ankles up and down, alternating feet. Repeat _10-15_ times. Do _1_ sessions per day.  http://gt2.exer.us/291   Copyright  VHI. All rights reserved.

## 2021-03-01 ENCOUNTER — Ambulatory Visit (HOSPITAL_COMMUNITY): Payer: Self-pay | Admitting: Physical Therapy

## 2021-03-01 ENCOUNTER — Encounter (HOSPITAL_COMMUNITY): Payer: Self-pay | Admitting: Physical Therapy

## 2021-03-01 ENCOUNTER — Other Ambulatory Visit: Payer: Self-pay

## 2021-03-01 DIAGNOSIS — R2689 Other abnormalities of gait and mobility: Secondary | ICD-10-CM

## 2021-03-01 DIAGNOSIS — M6281 Muscle weakness (generalized): Secondary | ICD-10-CM

## 2021-03-01 DIAGNOSIS — M545 Low back pain, unspecified: Secondary | ICD-10-CM

## 2021-03-01 NOTE — Patient Instructions (Signed)
Access Code: [email or web address] URL: [email or web address] Date: 03/01/2021 Prepared by: Josue Hector  Exercises Heel rises with counter support - 2-3 x daily - 7 x weekly - 2 sets - 10 reps Standing March with Counter Support - 2-3 x daily - 7 x weekly - 2 sets - 10 reps

## 2021-03-01 NOTE — Therapy (Signed)
[facility] [address] [place], [place], [address] Phone: [phone]   Fax:  [phone]  Physical Therapy Treatment  Patient Details  Name: Diamond [name] MRN: [id number] Date of Birth: [birth year] Referring Provider (PT): Pratik Manuella Ghazi DO   Encounter Date: 03/01/2021   PT End of Session - 03/01/21 1041    Visit Number 5    Number of Visits 8    Date for PT Re-Evaluation 03/14/21    Authorization Type Self Pay    PT Start Time 1035    PT Stop Time 1116    PT Time Calculation (min) 41 min    Equipment Utilized During Treatment Gait belt    Activity Tolerance Patient limited by fatigue;Patient limited by pain    Behavior During Therapy [facility] for tasks assessed/performed           Past Medical History:  Diagnosis Date  . ADHD (attention deficit hyperactivity disorder) 2007  . Allergy   . Asthma   . Bipolar disorder ([place])   . Crohn's colitis ([place])    Dx Dr. Cherrie Gauze  . Depression    early childhood, trial of seroquel?  . Diabetic neuropathy ([place])   . Edema   . GERD (gastroesophageal reflux disease)   . Hypertension   . Pancreatitis   . Panic attacks   . PTSD (post-traumatic stress disorder)   . Stroke ([place]) 07/2020   R side  . Type 1 diabetes ([place]) 2008    Past Surgical History:  Procedure Laterality Date  . ADENOIDECTOMY    . BIOPSY  02/01/2021   Procedure: BIOPSY;  Surgeon: Rogene Houston, MD;  [facility];  Service: Endoscopy;;  duodenal, esophageal,  . BIOPSY  02/03/2021   Procedure: BIOPSY;  Surgeon: Eloise Harman, DO;  [facility];  Service: Endoscopy;;  . COLONOSCOPY WITH PROPOFOL N/A 02/01/2021   Procedure: COLONOSCOPY WITH PROPOFOL;  Surgeon: Rogene Houston, MD;  [facility];  Service: Endoscopy;  Laterality: N/A;  . COLONOSCOPY WITH PROPOFOL N/A 02/03/2021   Procedure: COLONOSCOPY WITH PROPOFOL;  Surgeon: Eloise Harman, DO;  [facility];   Service: Endoscopy;  Laterality: N/A;  . ESOPHAGOGASTRODUODENOSCOPY (EGD) WITH PROPOFOL N/A 02/01/2021   Procedure: ESOPHAGOGASTRODUODENOSCOPY (EGD) WITH PROPOFOL;  Surgeon: Rogene Houston, MD;  [facility];  Service: Endoscopy;  Laterality: N/A;  . HERNIA REPAIR    . TONSILLECTOMY      There were no vitals filed for this visit.   Subjective Assessment - 03/01/21 1040    Subjective Patient says she is "terrible". She says she gained 10 pounds in swelling and her back is "killing me". She says the cause is unknown. States her swelling is unmanaged.    Limitations Standing;Walking;House hold activities    Patient Stated Goals Get better    Pain Score 10-Worst pain ever    Pain Location Back    Pain Orientation Posterior    Pain Descriptors / Indicators Pressure    Pain Type Chronic pain    Pain Onset More than a month ago    Pain Frequency Constant                             [facility] Adult PT Treatment/Exercise - 03/01/21 0001      Knee/Hip Exercises: Standing   Heel Raises Both;15 reps    Knee Flexion Both;15 reps  Other Standing Knee Exercises sidestepping at mat x 4 RT    Other Standing Knee Exercises staggered stance with int HHA 2 x 30"      Knee/Hip Exercises: Seated   Marching Both;10 reps   had difficulty due to "swollen stomach"     Modalities   Modalities Moist Heat   lumbar seated 8 minutes EOS                   PT Short Term Goals - 02/23/21 0837      PT SHORT TERM GOAL #1   Title Patient will be independent with initial HEP and self-management strategies to improve functional outcomes    Time 2    Period Weeks    Status On-going    Target Date 02/28/21             PT Long Term Goals - 02/23/21 0837      PT LONG TERM GOAL #1   Title Patient will be able to ambulate at least 300 feet during 2MWT with LRAD to demonstrate improved ability to perform functional mobility and associated tasks.    Time 4    Period  Weeks    Status On-going      PT LONG TERM GOAL #2   Title Patient will report at least 70% overall improvement in subjective complaint to indicate improvement in ability to perform ADLs.    Time 4    Period Weeks    Status On-going      PT LONG TERM GOAL #3   Title Patient will be able to perform stand x 5 in < 25 seconds to demonstrate improvement in functional mobility and reduced risk for falls.    Status On-going                 Plan - 03/01/21 1112    Clinical Impression Statement Patient with overall poor tolerance to activity. Patient performs activity in slow, labored manor. She complains of swelling in her stomach which she says causes her pain with moving her legs. She notes increased lumbar pain in standing. Added static balance for improving stability in WB. Patient did fairly well with this but notes feeling off balance, though maintains position with int HHA. Patient request heat at end of session for lumbar pain. She says this was helpful from last visit. Will attempt to progress WB activity for meaningful functional mobility progression. Issued updated HEP handout.    Personal Factors and Comorbidities Comorbidity 3+    Comorbidities HTN, DM I, CVA (April 2021)    Examination-Activity Limitations Bathing;Lift;Stand;Locomotion Level;Toileting;Transfers;Stairs    Examination-Participation Restrictions Laundry;Occupation;Community Activity;Cleaning;Yard Work    Stability/Clinical Decision Making Stable/Uncomplicated    Rehab Potential Fair    PT Frequency 2x / week    PT Duration 4 weeks    PT Treatment/Interventions ADLs/Self Care Home Management;Aquatic Therapy;Biofeedback;Canalith Repostioning;Cryotherapy;Electrical Stimulation;Contrast Bath;Therapeutic exercise;Manual lymph drainage;Manual techniques;Functional mobility training;Neuromuscular re-education;Scar mobilization;Passive range of motion;Balance training;DME Instruction;Gait training;Iontophoresis 86m/ml  Dexamethasone;Stair training;Moist Heat;Traction;Ultrasound;Parrafin;Therapeutic activities;Fluidtherapy;Orthotic Fit/Training;Compression bandaging;Dry needling;Energy conservation;Splinting;Taping;Vasopneumatic Device;Other (comment);Joint Manipulations;Spinal Manipulations;Visual/perceptual remediation/compensation;Vestibular;Patient/family education    PT Next Visit Plan Follow up with compression garments and AD. Progress LE strength as tolerated. Seated>Standing> balance>Gait. Continue WB activity. Possibly try step ups    PT Home Exercise Plan Eval: seated march, LAQs; 02/23/21 - QS, ab set, SAQ, ankle pump 3/30 heel raise, standing march    Consulted and Agree with Plan of Care Patient           Patient will  benefit from skilled therapeutic intervention in order to improve the following deficits and impairments:  Pain,Improper body mechanics,Abnormal gait,Decreased activity tolerance,Decreased balance,Decreased safety awareness,Difficulty walking,Decreased strength,Decreased endurance  Visit Diagnosis: Other abnormalities of gait and mobility  Muscle weakness (generalized)  Low back pain, unspecified back pain laterality, unspecified chronicity, unspecified whether sciatica present     Problem List Patient Active Problem List   Diagnosis Date Noted  . Bladder outlet obstruction   . Constipation   . Abdominal pain, epigastric   . Abnormal computed tomography of cecum and terminal ileum   . Hyperosmolar hyperglycemic state (HHS) ([place]) 01/30/2021  . Crohn's disease ([place]) 12/08/2020  . Gastroparesis 12/08/2020  . Essential hypertension, benign 11/01/2020  . Edema 10/19/2020  . Anasarca 10/18/2020  . Hypoalbuminemia 10/18/2020  . Personal history of noncompliance with medical treatment, presenting hazards to health 10/12/2020  . Bilateral lower extremity edema 08/25/2020  . Pneumonia 08/25/2020  . Eczema 10/23/2012  . Abdominal pain 08/27/2012  . GERD (gastroesophageal  reflux disease) 07/25/2012  . Mood disorder ([place]) 07/09/2012  . Contraception management 07/09/2012  . History of Crohn's disease 07/09/2012  . Uncontrolled type 1 diabetes mellitus with hyperglycemia ([place]) 07/08/2012  . Asthma, persistent controlled 07/08/2012   11:34 AM, 03/01/21 Josue Hector PT DPT  Physical Therapist with [facility]  [phone] [facility] [address] [place], [place], [address] Phone: [phone]   Fax:  [phone]  Name: Diamond [name] MRN: [id number] Date of Birth: 02-22-1996

## 2021-03-02 ENCOUNTER — Telehealth (HOSPITAL_COMMUNITY): Payer: Self-pay | Admitting: Physical Therapy

## 2021-03-02 ENCOUNTER — Telehealth: Payer: Self-pay

## 2021-03-02 ENCOUNTER — Emergency Department (HOSPITAL_COMMUNITY): Payer: Self-pay

## 2021-03-02 ENCOUNTER — Inpatient Hospital Stay (HOSPITAL_COMMUNITY)
Admission: EM | Admit: 2021-03-02 | Discharge: 2021-03-10 | DRG: 871 | Disposition: A | Discharge status: Home Health | Payer: Self-pay | Attending: Family Medicine | Admitting: Family Medicine

## 2021-03-02 ENCOUNTER — Telehealth: Payer: Self-pay | Admitting: Physician Assistant

## 2021-03-02 ENCOUNTER — Other Ambulatory Visit: Payer: Self-pay

## 2021-03-02 ENCOUNTER — Encounter (HOSPITAL_COMMUNITY): Payer: Self-pay | Admitting: Emergency Medicine

## 2021-03-02 ENCOUNTER — Ambulatory Visit (HOSPITAL_COMMUNITY): Payer: Self-pay | Admitting: Physical Therapy

## 2021-03-02 DIAGNOSIS — I10 Essential (primary) hypertension: Secondary | ICD-10-CM | POA: Diagnosis present

## 2021-03-02 DIAGNOSIS — E1121 Type 2 diabetes mellitus with diabetic nephropathy: Secondary | ICD-10-CM | POA: Diagnosis present

## 2021-03-02 DIAGNOSIS — Z91199 Patient's noncompliance with other medical treatment and regimen due to unspecified reason: Secondary | ICD-10-CM

## 2021-03-02 DIAGNOSIS — R601 Generalized edema: Secondary | ICD-10-CM | POA: Diagnosis present

## 2021-03-02 DIAGNOSIS — D75839 Thrombocytosis, unspecified: Secondary | ICD-10-CM | POA: Diagnosis present

## 2021-03-02 DIAGNOSIS — Z880 Allergy status to penicillin: Secondary | ICD-10-CM

## 2021-03-02 DIAGNOSIS — A4101 Sepsis due to Methicillin susceptible Staphylococcus aureus: Principal | ICD-10-CM | POA: Diagnosis present

## 2021-03-02 DIAGNOSIS — E8809 Other disorders of plasma-protein metabolism, not elsewhere classified: Secondary | ICD-10-CM | POA: Diagnosis present

## 2021-03-02 DIAGNOSIS — M549 Dorsalgia, unspecified: Secondary | ICD-10-CM | POA: Diagnosis present

## 2021-03-02 DIAGNOSIS — R509 Fever, unspecified: Secondary | ICD-10-CM

## 2021-03-02 DIAGNOSIS — F909 Attention-deficit hyperactivity disorder, unspecified type: Secondary | ICD-10-CM | POA: Diagnosis present

## 2021-03-02 DIAGNOSIS — Z9119 Patient's noncompliance with other medical treatment and regimen: Secondary | ICD-10-CM

## 2021-03-02 DIAGNOSIS — M25569 Pain in unspecified knee: Secondary | ICD-10-CM | POA: Diagnosis present

## 2021-03-02 DIAGNOSIS — D6489 Other specified anemias: Secondary | ICD-10-CM | POA: Diagnosis not present

## 2021-03-02 DIAGNOSIS — Z8719 Personal history of other diseases of the digestive system: Secondary | ICD-10-CM

## 2021-03-02 DIAGNOSIS — E875 Hyperkalemia: Secondary | ICD-10-CM | POA: Diagnosis not present

## 2021-03-02 DIAGNOSIS — E1021 Type 1 diabetes mellitus with diabetic nephropathy: Secondary | ICD-10-CM | POA: Diagnosis present

## 2021-03-02 DIAGNOSIS — E10649 Type 1 diabetes mellitus with hypoglycemia without coma: Secondary | ICD-10-CM | POA: Diagnosis present

## 2021-03-02 DIAGNOSIS — Z881 Allergy status to other antibiotic agents status: Secondary | ICD-10-CM

## 2021-03-02 DIAGNOSIS — K219 Gastro-esophageal reflux disease without esophagitis: Secondary | ICD-10-CM | POA: Diagnosis present

## 2021-03-02 DIAGNOSIS — K5900 Constipation, unspecified: Secondary | ICD-10-CM | POA: Diagnosis present

## 2021-03-02 DIAGNOSIS — J189 Pneumonia, unspecified organism: Secondary | ICD-10-CM | POA: Diagnosis present

## 2021-03-02 DIAGNOSIS — N049 Nephrotic syndrome with unspecified morphologic changes: Secondary | ICD-10-CM

## 2021-03-02 DIAGNOSIS — Z8673 Personal history of transient ischemic attack (TIA), and cerebral infarction without residual deficits: Secondary | ICD-10-CM

## 2021-03-02 DIAGNOSIS — Z833 Family history of diabetes mellitus: Secondary | ICD-10-CM

## 2021-03-02 DIAGNOSIS — E1065 Type 1 diabetes mellitus with hyperglycemia: Secondary | ICD-10-CM | POA: Diagnosis present

## 2021-03-02 DIAGNOSIS — N179 Acute kidney failure, unspecified: Secondary | ICD-10-CM | POA: Diagnosis not present

## 2021-03-02 DIAGNOSIS — E860 Dehydration: Secondary | ICD-10-CM | POA: Diagnosis not present

## 2021-03-02 DIAGNOSIS — R809 Proteinuria, unspecified: Secondary | ICD-10-CM | POA: Diagnosis present

## 2021-03-02 DIAGNOSIS — G8929 Other chronic pain: Secondary | ICD-10-CM | POA: Diagnosis present

## 2021-03-02 DIAGNOSIS — Z794 Long term (current) use of insulin: Secondary | ICD-10-CM

## 2021-03-02 DIAGNOSIS — K3184 Gastroparesis: Secondary | ICD-10-CM | POA: Diagnosis present

## 2021-03-02 DIAGNOSIS — J15211 Pneumonia due to Methicillin susceptible Staphylococcus aureus: Secondary | ICD-10-CM | POA: Diagnosis present

## 2021-03-02 DIAGNOSIS — E1043 Type 1 diabetes mellitus with diabetic autonomic (poly)neuropathy: Secondary | ICD-10-CM | POA: Diagnosis present

## 2021-03-02 DIAGNOSIS — K5090 Crohn's disease, unspecified, without complications: Secondary | ICD-10-CM | POA: Diagnosis present

## 2021-03-02 DIAGNOSIS — E871 Hypo-osmolality and hyponatremia: Secondary | ICD-10-CM | POA: Diagnosis present

## 2021-03-02 DIAGNOSIS — Z8249 Family history of ischemic heart disease and other diseases of the circulatory system: Secondary | ICD-10-CM

## 2021-03-02 DIAGNOSIS — D72829 Elevated white blood cell count, unspecified: Secondary | ICD-10-CM

## 2021-03-02 DIAGNOSIS — Z20822 Contact with and (suspected) exposure to covid-19: Secondary | ICD-10-CM | POA: Diagnosis present

## 2021-03-02 DIAGNOSIS — R600 Localized edema: Secondary | ICD-10-CM | POA: Diagnosis present

## 2021-03-02 DIAGNOSIS — Z91040 Latex allergy status: Secondary | ICD-10-CM

## 2021-03-02 DIAGNOSIS — E1040 Type 1 diabetes mellitus with diabetic neuropathy, unspecified: Secondary | ICD-10-CM | POA: Diagnosis present

## 2021-03-02 DIAGNOSIS — Z79899 Other long term (current) drug therapy: Secondary | ICD-10-CM

## 2021-03-02 LAB — URINALYSIS, ROUTINE W REFLEX MICROSCOPIC
Bilirubin Urine: NEGATIVE
Glucose, UA: >=500 mg/dL — AB
Ketones, ur: NEGATIVE mg/dL
Nitrite: NEGATIVE
Protein, ur: 100 mg/dL — AB
Specific Gravity, Urine: 1.01 (ref 1.005–1.030)
pH: 6 (ref 5.0–8.0)

## 2021-03-02 LAB — COMPREHENSIVE METABOLIC PANEL
ALT: 11 U/L (ref 0–44)
AST: 11 U/L — ABNORMAL LOW (ref 15–41)
Albumin: 1.9 g/dL — ABNORMAL LOW (ref 3.5–5.0)
Alkaline Phosphatase: 96 U/L (ref 38–126)
Anion gap: 8 (ref 5–15)
BUN: 18 mg/dL (ref 6–20)
CO2: 24 mmol/L (ref 22–32)
Calcium: 8.6 mg/dL — ABNORMAL LOW (ref 8.9–10.3)
Chloride: 98 mmol/L (ref 98–111)
Creatinine, Ser: 0.92 mg/dL (ref 0.44–1.00)
GFR, Estimated: >60 mL/min (ref >60)
Glucose, Bld: 413 mg/dL — ABNORMAL HIGH (ref 70–99)
Potassium: 4.1 mmol/L (ref 3.5–5.1)
Sodium: 130 mmol/L — ABNORMAL LOW (ref 135–145)
Total Bilirubin: 0.6 mg/dL (ref 0.3–1.2)
Total Protein: 7.2 g/dL (ref 6.5–8.1)

## 2021-03-02 LAB — TYPE AND SCREEN
ABO/RH(D): A POS
Antibody Screen: NEGATIVE

## 2021-03-02 LAB — PROTIME-INR
INR: 1 (ref 0.8–1.2)
Prothrombin Time: 12.6 seconds (ref 11.4–15.2)

## 2021-03-02 LAB — LACTIC ACID, PLASMA
Lactic Acid, Venous: 1.1 mmol/L (ref 0.5–1.9)
Lactic Acid, Venous: 1.8 mmol/L (ref 0.5–1.9)

## 2021-03-02 LAB — TROPONIN I (HIGH SENSITIVITY)
Troponin I (High Sensitivity): 14 ng/L (ref <18)
Troponin I (High Sensitivity): 10 ng/L (ref <18)

## 2021-03-02 LAB — CBC WITH DIFFERENTIAL/PLATELET
Abs Immature Granulocytes: 0.06 10*3/uL (ref 0.00–0.07)
Basophils Absolute: 0.1 10*3/uL (ref 0.0–0.1)
Basophils Relative: 1 %
Eosinophils Absolute: 0.6 10*3/uL — ABNORMAL HIGH (ref 0.0–0.5)
Eosinophils Relative: 5 %
HCT: 29 % — ABNORMAL LOW (ref 36.0–46.0)
Hemoglobin: 9.4 g/dL — ABNORMAL LOW (ref 12.0–15.0)
Immature Granulocytes: 1 %
Lymphocytes Relative: 16 %
Lymphs Abs: 1.9 10*3/uL (ref 0.7–4.0)
MCH: 29 pg (ref 26.0–34.0)
MCHC: 32.4 g/dL (ref 30.0–36.0)
MCV: 89.5 fL (ref 80.0–100.0)
Monocytes Absolute: 0.9 10*3/uL (ref 0.1–1.0)
Monocytes Relative: 7 %
Neutro Abs: 8.6 10*3/uL — ABNORMAL HIGH (ref 1.7–7.7)
Neutrophils Relative %: 70 %
Platelets: 559 10*3/uL — ABNORMAL HIGH (ref 150–400)
RBC: 3.24 MIL/uL — ABNORMAL LOW (ref 3.87–5.11)
RDW: 12.4 % (ref 11.5–15.5)
WBC: 12.1 10*3/uL — ABNORMAL HIGH (ref 4.0–10.5)
nRBC: 0 % (ref 0.0–0.2)

## 2021-03-02 LAB — IRON AND TIBC
Iron: 19 ug/dL — ABNORMAL LOW (ref 28–170)
Saturation Ratios: 8 % — ABNORMAL LOW (ref 10.4–31.8)
TIBC: 234 ug/dL — ABNORMAL LOW (ref 250–450)
UIBC: 215 ug/dL

## 2021-03-02 LAB — APTT: aPTT: 35 seconds (ref 24–36)

## 2021-03-02 LAB — RETICULOCYTES
Immature Retic Fract: 30.7 % — ABNORMAL HIGH (ref 2.3–15.9)
RBC.: 3.21 MIL/uL — ABNORMAL LOW (ref 3.87–5.11)
Retic Count, Absolute: 88.9 10*3/uL (ref 19.0–186.0)
Retic Ct Pct: 2.8 % (ref 0.4–3.1)

## 2021-03-02 LAB — HEMOGLOBIN A1C
Hgb A1c MFr Bld: 5.9 % — ABNORMAL HIGH (ref 4.8–5.6)
Mean Plasma Glucose: 122.63 mg/dL

## 2021-03-02 LAB — RESP PANEL BY RT-PCR (FLU A&B, COVID) ARPGX2
Influenza A by PCR: NEGATIVE
Influenza B by PCR: NEGATIVE
SARS Coronavirus 2 by RT PCR: NEGATIVE

## 2021-03-02 LAB — CBG MONITORING, ED
Glucose-Capillary: 383 mg/dL — ABNORMAL HIGH (ref 70–99)
Glucose-Capillary: 289 mg/dL — ABNORMAL HIGH (ref 70–99)

## 2021-03-02 LAB — FOLATE: Folate: 17.6 ng/mL (ref >5.9)

## 2021-03-02 LAB — BRAIN NATRIURETIC PEPTIDE: B Natriuretic Peptide: 89 pg/mL (ref 0.0–100.0)

## 2021-03-02 LAB — VITAMIN B12: Vitamin B-12: 403 pg/mL (ref 180–914)

## 2021-03-02 LAB — MAGNESIUM: Magnesium: 2.1 mg/dL (ref 1.7–2.4)

## 2021-03-02 LAB — POC URINE PREG, ED: Preg Test, Ur: NEGATIVE

## 2021-03-02 LAB — GLUCOSE, CAPILLARY: Glucose-Capillary: 182 mg/dL — ABNORMAL HIGH (ref 70–99)

## 2021-03-02 LAB — ABO/RH: ABO/RH(D): A POS

## 2021-03-02 LAB — BETA-HYDROXYBUTYRIC ACID: Beta-Hydroxybutyric Acid: 0.66 mmol/L — ABNORMAL HIGH (ref 0.05–0.27)

## 2021-03-02 LAB — FERRITIN: Ferritin: 115 ng/mL (ref 11–307)

## 2021-03-02 MED ORDER — INSULIN GLARGINE 100 UNIT/ML ~~LOC~~ SOLN
30.0000 [IU] | Freq: Every day | SUBCUTANEOUS | Status: DC
  Administered 2021-03-02 – 2021-03-03 (×2): 30 [IU] via SUBCUTANEOUS
  Filled 2021-03-02 (×5): qty 0.3

## 2021-03-02 MED ORDER — METOPROLOL TARTRATE 5 MG/5ML IV SOLN
2.5000 mg | Freq: Once | INTRAVENOUS | Status: AC
  Administered 2021-03-02: 2.5 mg via INTRAVENOUS
  Filled 2021-03-02: qty 5

## 2021-03-02 MED ORDER — TAMSULOSIN HCL 0.4 MG PO CAPS
0.4000 mg | ORAL_CAPSULE | Freq: Every day | ORAL | Status: DC
  Administered 2021-03-02 – 2021-03-10 (×9): 0.4 mg via ORAL
  Filled 2021-03-02 (×10): qty 1

## 2021-03-02 MED ORDER — POTASSIUM CHLORIDE CRYS ER 10 MEQ PO TBCR
10.0000 meq | EXTENDED_RELEASE_TABLET | Freq: Every day | ORAL | Status: DC
  Administered 2021-03-03 – 2021-03-07 (×5): 10 meq via ORAL
  Filled 2021-03-02 (×7): qty 1

## 2021-03-02 MED ORDER — HYDRALAZINE HCL 20 MG/ML IJ SOLN
5.0000 mg | Freq: Four times a day (QID) | INTRAMUSCULAR | Status: DC | PRN
  Filled 2021-03-02: qty 1

## 2021-03-02 MED ORDER — PANTOPRAZOLE SODIUM 40 MG PO TBEC
40.0000 mg | DELAYED_RELEASE_TABLET | Freq: Every day | ORAL | Status: DC
  Administered 2021-03-03 – 2021-03-10 (×8): 40 mg via ORAL
  Filled 2021-03-02 (×8): qty 1

## 2021-03-02 MED ORDER — ENOXAPARIN SODIUM 40 MG/0.4ML ~~LOC~~ SOLN
40.0000 mg | SUBCUTANEOUS | Status: DC
  Administered 2021-03-02 – 2021-03-09 (×7): 40 mg via SUBCUTANEOUS
  Filled 2021-03-02 (×9): qty 0.4

## 2021-03-02 MED ORDER — GABAPENTIN 100 MG PO CAPS: 100.0000 mg | ORAL_CAPSULE | Freq: Every day | ORAL | Status: DC

## 2021-03-02 MED ORDER — ALBUTEROL SULFATE HFA 108 (90 BASE) MCG/ACT IN AERS: 2.0000 | INHALATION_SPRAY | Freq: Four times a day (QID) | RESPIRATORY_TRACT | Status: DC | PRN

## 2021-03-02 MED ORDER — FUROSEMIDE 10 MG/ML IJ SOLN
80.0000 mg | Freq: Once | INTRAMUSCULAR | Status: AC
  Administered 2021-03-02: 80 mg via INTRAVENOUS
  Filled 2021-03-02: qty 8

## 2021-03-02 MED ORDER — INSULIN ASPART 100 UNIT/ML ~~LOC~~ SOLN
15.0000 [IU] | Freq: Once | SUBCUTANEOUS | Status: AC
  Administered 2021-03-02: 15 [IU] via SUBCUTANEOUS
  Filled 2021-03-02: qty 1

## 2021-03-02 MED ORDER — FUROSEMIDE 10 MG/ML IJ SOLN
40.0000 mg | Freq: Two times a day (BID) | INTRAMUSCULAR | Status: DC
  Administered 2021-03-03 – 2021-03-07 (×9): 40 mg via INTRAVENOUS
  Filled 2021-03-02 (×9): qty 4

## 2021-03-02 MED ORDER — TRAMADOL HCL 50 MG PO TABS
50.0000 mg | ORAL_TABLET | Freq: Four times a day (QID) | ORAL | Status: DC | PRN
Start: 2021-03-02 — End: 2021-03-03
  Administered 2021-03-02 – 2021-03-03 (×2): 50 mg via ORAL
  Filled 2021-03-02 (×2): qty 1

## 2021-03-02 MED ORDER — LISINOPRIL 10 MG PO TABS
20.0000 mg | ORAL_TABLET | Freq: Every day | ORAL | Status: DC
  Administered 2021-03-03 – 2021-03-07 (×5): 20 mg via ORAL
  Filled 2021-03-02 (×6): qty 2

## 2021-03-02 MED ORDER — INSULIN ASPART 100 UNIT/ML ~~LOC~~ SOLN
0.0000 [IU] | Freq: Three times a day (TID) | SUBCUTANEOUS | Status: DC
  Administered 2021-03-02: 8 [IU] via SUBCUTANEOUS
  Administered 2021-03-03 – 2021-03-04 (×2): 3 [IU] via SUBCUTANEOUS
  Administered 2021-03-05 (×2): 2 [IU] via SUBCUTANEOUS
  Filled 2021-03-02: qty 1

## 2021-03-02 MED ORDER — INSULIN ASPART 100 UNIT/ML ~~LOC~~ SOLN: 0.0000 [IU] | Freq: Every day | SUBCUTANEOUS | Status: DC

## 2021-03-02 NOTE — Telephone Encounter (Addendum)
  Late entry from 03/02/21 at 10:10 am today   Received contact from [facility] paramedic with [place] [facility] care regarding client of [facility] that she was currently transporting to PT at [facility] outpatient center this am.  She stated that client was having shortness of breath, reported a weight gain and that her am blood sugar was 542 (client history of type 1 DM) and her oxygen saturations was 93-97% and heart rate 120. Talked also to Ms Rayfield who confirmed weight gain with swelling to lower extremities and "not feeling well"  Recommendation made for community paramedic to take immediately to [facility] for treatment and evaluation.  Called an notified [facility] staff that recommendation was made based on verbal data obtained for client to seek emergency evaluation.  Will plan to follow after discharge and client agreeable.  Debria Garret RN [employer]

## 2021-03-02 NOTE — ED Triage Notes (Signed)
Pt reports she has gained 30 lb in 4 days; c/o swelling to "whole body"; pt reports she was seen by PCP 02/20/21 was told to come to the ER when she called this am

## 2021-03-02 NOTE — ED Provider Notes (Addendum)
[facility] EMERGENCY DEPARTMENT Provider Note   CSN: [id number] Arrival date & time: 03/02/21  1057     History Chief Complaint  Patient presents with  . Weight Gain    Diamond [name] is a 25 y.o. female who presents with a prompting of her primary care doctor for evaluation given whole body swelling with 30 pound weight gain approximately 4 days.  Patient was recently discharged this week from [facility] for [facility] and anasarca in the context of type 1 diabetes.  Patient also has history of Crohn's disease.  She is on Lasix 90 mg daily, without clear etiology for swelling.  Cardiac echo in 2021 revealed LVEF of 65 to 70%.   Shortness of breath with intermittent dry cough, abdominal distention, generalized abdominal pain and whole body aching.  She also endorses severe bilateral lower extremity edema, and feeling cold all the time.  She endorses normal bowel movements for her and denies any urinary symptoms.  I personally reviewed this patient's medical records.  She has history of Crohn's disease, type 1 diabetes, and history of CVA additionally more recently she has been experiencing fluid overload on Lasix, without clear etiology.     HPI     Past Medical History:  Diagnosis Date  . ADHD (attention deficit hyperactivity disorder) 2007  . Allergy   . Asthma   . Bipolar disorder ([place])   . Crohn's colitis ([place])    Dx Dr. Cherrie Gauze  . Depression    early childhood, trial of seroquel?  . Diabetic neuropathy ([place])   . Edema   . GERD (gastroesophageal reflux disease)   . Hypertension   . Pancreatitis   . Panic attacks   . PTSD (post-traumatic stress disorder)   . Stroke ([place]) 07/2020   R side  . Type 1 diabetes ([place]) 2008    Patient Active Problem List   Diagnosis Date Noted  . Bladder outlet obstruction   . Constipation   . Abdominal pain, epigastric   . Abnormal computed tomography of cecum and terminal ileum   . Hyperosmolar hyperglycemic state  (HHS) ([place]) 01/30/2021  . Crohn's disease ([place]) 12/08/2020  . Gastroparesis 12/08/2020  . Essential hypertension, benign 11/01/2020  . Edema 10/19/2020  . Anasarca 10/18/2020  . Hypoalbuminemia 10/18/2020  . Personal history of noncompliance with medical treatment, presenting hazards to health 10/12/2020  . Bilateral lower extremity edema 08/25/2020  . Pneumonia 08/25/2020  . Eczema 10/23/2012  . Abdominal pain 08/27/2012  . GERD (gastroesophageal reflux disease) 07/25/2012  . Mood disorder ([place]) 07/09/2012  . Contraception management 07/09/2012  . History of Crohn's disease 07/09/2012  . Uncontrolled type 1 diabetes mellitus with hyperglycemia ([place]) 07/08/2012  . Asthma, persistent controlled 07/08/2012    Past Surgical History:  Procedure Laterality Date  . ADENOIDECTOMY    . BIOPSY  02/01/2021   Procedure: BIOPSY;  Surgeon: Rogene Houston, MD;  Location: [facility];  Service: Endoscopy;;  duodenal, esophageal,  . BIOPSY  02/03/2021   Procedure: BIOPSY;  Surgeon: Eloise Harman, DO;  Location: [facility];  Service: Endoscopy;;  . COLONOSCOPY WITH PROPOFOL N/A 02/01/2021   Procedure: COLONOSCOPY WITH PROPOFOL;  Surgeon: Rogene Houston, MD;  Location: [facility];  Service: Endoscopy;  Laterality: N/A;  . COLONOSCOPY WITH PROPOFOL N/A 02/03/2021   Procedure: COLONOSCOPY WITH PROPOFOL;  Surgeon: Eloise Harman, DO;  Location: [facility];  Service: Endoscopy;  Laterality: N/A;  . ESOPHAGOGASTRODUODENOSCOPY (EGD) WITH PROPOFOL N/A 02/01/2021  Procedure: ESOPHAGOGASTRODUODENOSCOPY (EGD) WITH PROPOFOL;  Surgeon: Rogene Houston, MD;  Location: [facility];  Service: Endoscopy;  Laterality: N/A;  . HERNIA REPAIR    . TONSILLECTOMY       OB History   No obstetric history on file.     Family History  Problem Relation Age of Onset  . Hypertension Mother   . Diabetes Mother   . Heart disease Mother   . Crohn's disease Father   . Alcohol abuse Father   . Drug  abuse Father   . Mental illness Father   . Cancer Father   . Hypertension Maternal Aunt   . Seizures Maternal Aunt   . Depression Maternal Aunt   . Asthma Maternal Uncle   . Hypertension Maternal Uncle   . Cancer Maternal Grandmother   . Diabetes Maternal Grandmother   . Mental illness Maternal Grandfather     Social History   Tobacco Use  . Smoking status: Never Smoker  . Smokeless tobacco: Never Used  Vaping Use  . Vaping Use: Former  Substance Use Topics  . Alcohol use: No  . Drug use: No    Home Medications Prior to Admission medications   Medication Sig Start Date End Date Taking? Authorizing Provider  albuterol (VENTOLIN HFA) 108 (90 Base) MCG/ACT inhaler Inhale 2 puffs into the lungs every 6 (six) hours as needed. For shortness of breath 02/15/21   [name], PA-C  citalopram (CELEXA) 10 MG tablet Take 10 mg by mouth daily. Patient not taking: Reported on 02/15/2021    [provider]  furosemide (LASIX) 20 MG tablet Take 2 tablets (40 mg total) by mouth daily. 02/15/21   [name], PA-C  gabapentin (NEURONTIN) 100 MG capsule Take 1 capsule (100 mg total) by mouth daily. 10/13/20   [name], PA-C  insulin glargine (LANTUS) 100 UNIT/ML injection Inject 0.3 mLs (30 Units total) into the skin daily. 02/03/21   Barton Dubois, MD  Insulin Glulisine (APIDRA [place]) Inject into the skin. ssi    [provider]  lidocaine (LIDODERM) 5 % Place 1 patch onto the skin daily. Remove & Discard patch within 12 hours or as directed by MD Patient not taking: Reported on 02/15/2021 02/12/21   Evalee Jefferson, PA-C  lisinopril (ZESTRIL) 20 MG tablet Take 1 tablet (20 mg total) by mouth daily. 02/15/21   [name], PA-C  metoCLOPramide (REGLAN) 5 MG tablet Take 1 tablet (5 mg total) by mouth every 8 (eight) hours as needed for refractory nausea / vomiting (refractory symptoms). Patient not taking: Reported on 02/15/2021 02/03/21 02/03/22  Barton Dubois, MD   omeprazole (PRILOSEC) 40 MG capsule Take 1 capsule (40 mg total) by mouth daily. 02/15/21   [name], PA-C  ondansetron (ZOFRAN ODT) 8 MG disintegrating tablet Take 1 tablet (8 mg total) by mouth every 8 (eight) hours as needed for nausea or vomiting. 02/15/21   [name], PA-C  potassium chloride (KLOR-CON) 10 MEQ tablet Take 1 tablet (10 mEq total) by mouth daily. Take While taking Lasix/furosemide 02/15/21   [name], PA-C  tamsulosin (FLOMAX) 0.4 MG CAPS capsule Take 1 capsule (0.4 mg total) by mouth daily. 02/15/21   [name], PA-C  traMADol (ULTRAM) 50 MG tablet Take 1 tablet (50 mg total) by mouth every 6 (six) hours as needed. Patient not taking: Reported on 02/15/2021 02/12/21   Evalee Jefferson, PA-C  traZODone (DESYREL) 50 MG tablet Take 50 mg by mouth at bedtime. Patient not taking: Reported on  02/15/2021    [provider]    Allergies    Amoxicillin, Dexamethasone, Doxycycline, Penicillins, Augmentin [amoxicillin-pot clavulanate], Ceftriaxone sodium in dextrose, Clavulanic acid, Adhesive [tape], Latex, Other, and Pineapple  Review of Systems   Review of Systems  Constitutional: Positive for activity change, appetite change, chills, fatigue and unexpected weight change. Negative for fever.  HENT: Negative.   Eyes: Negative.   Respiratory: Positive for cough and shortness of breath. Negative for chest tightness.   Cardiovascular: Positive for palpitations and leg swelling.  Gastrointestinal: Positive for abdominal distention, abdominal pain and nausea. Negative for diarrhea and vomiting.  Genitourinary: Positive for frequency and urgency. Negative for decreased urine volume, dysuria, vaginal bleeding, vaginal discharge and vaginal pain.  Allergic/Immunologic: Positive for immunocompromised state.  Neurological: Positive for weakness and headaches.    Physical Exam Updated Vital Signs BP (!) 149/103   Pulse (!) 112   Temp 97.6 F (36.4 C)  (Temporal)   Resp 14   Ht 4' 11"  (1.499 m)   Wt 66.5 kg   SpO2 99%   BMI 29.61 kg/m   Physical Exam Vitals and nursing note reviewed.  Constitutional:      Appearance: She is ill-appearing.     Comments: Anasarca  HENT:     Head: Normocephalic and atraumatic.     Nose: Nose normal.     Mouth/Throat:     Mouth: Mucous membranes are moist.     Pharynx: Oropharynx is clear. Uvula midline. No oropharyngeal exudate or posterior oropharyngeal erythema.     Tonsils: No tonsillar exudate.  Eyes:     General: Vision grossly intact.        Right eye: No discharge.        Left eye: No discharge.     Extraocular Movements: Extraocular movements intact.     Conjunctiva/sclera: Conjunctivae normal.     Pupils: Pupils are equal, round, and reactive to light.     Comments: Bilateral periorbital edema  Neck:     Trachea: Trachea and phonation normal.  Cardiovascular:     Rate and Rhythm: Regular rhythm. Tachycardia present.     Pulses:          Radial pulses are 2+ on the right side and 2+ on the left side.       Dorsalis pedis pulses are detected w/ Doppler on the right side and detected w/ Doppler on the left side.     Heart sounds: Normal heart sounds. No murmur heard.   Pulmonary:     Effort: Pulmonary effort is normal. Tachypnea present. No accessory muscle usage, prolonged expiration or respiratory distress.     Breath sounds: Normal breath sounds. No wheezing or rales.  Chest:     Chest wall: No mass, lacerations, deformity, swelling, tenderness, crepitus or edema.  Abdominal:     General: Bowel sounds are decreased. There is distension.     Palpations: Abdomen is soft.     Tenderness: There is generalized abdominal tenderness. There is no guarding or rebound.  Musculoskeletal:        General: No deformity.     Cervical back: Normal range of motion and neck supple. Edema present. No rigidity, tenderness or crepitus. No pain with movement, spinous process tenderness or muscular  tenderness.     Right lower leg: 3+ Pitting Edema present.     Left lower leg: 3+ Pitting Edema present.  Lymphadenopathy:     Cervical: No cervical adenopathy.  Skin:    General: Skin  is warm and dry.     Capillary Refill: Capillary refill takes less than 2 seconds.  Neurological:     General: No focal deficit present.     Mental Status: She is alert and oriented to person, place, and time. Mental status is at baseline.     Sensory: Sensation is intact.     Motor: Motor function is intact.     Comments: Moving all 4 extremities spontaneously and without difficulty.  Psychiatric:        Mood and Affect: Mood normal.     ED Results / Procedures / Treatments   Labs (all labs ordered are listed, but only abnormal results are displayed) Labs Reviewed  COMPREHENSIVE METABOLIC PANEL - Abnormal; Notable for the following components:      Result Value   Sodium 130 (*)    Glucose, Bld 413 (*)    Calcium 8.6 (*)    Albumin 1.9 (*)    AST 11 (*)    All other components within normal limits  CBC WITH DIFFERENTIAL/PLATELET - Abnormal; Notable for the following components:   WBC 12.1 (*)    RBC 3.24 (*)    Hemoglobin 9.4 (*)    HCT 29.0 (*)    Platelets 559 (*)    Neutro Abs 8.6 (*)    Eosinophils Absolute 0.6 (*)    All other components within normal limits  CBG MONITORING, ED - Abnormal; Notable for the following components:   Glucose-Capillary 383 (*)    All other components within normal limits  RESP PANEL BY RT-PCR (FLU A&B, COVID) ARPGX2  BRAIN NATRIURETIC PEPTIDE  MAGNESIUM  LACTIC ACID, PLASMA  PROTIME-INR  APTT  URINALYSIS, ROUTINE W REFLEX MICROSCOPIC  BETA-HYDROXYBUTYRIC ACID  LACTIC ACID, PLASMA  POC URINE PREG, ED  TYPE AND SCREEN  ABO/RH  TROPONIN I (HIGH SENSITIVITY)  TROPONIN I (HIGH SENSITIVITY)    EKG None  Radiology DG Chest Port 1 View  Result Date: 03/02/2021 CLINICAL DATA:  Shortness of breath Gain 30 pound in 4 days Diffuse swelling EXAM:  PORTABLE CHEST 1 VIEW COMPARISON:  02/02/2021 FINDINGS: The heart size and mediastinal contours are within normal limits. Both lungs are clear. The visualized skeletal structures are unremarkable. IMPRESSION: No active disease. Electronically Signed   By: Miachel Roux M.D.   On: 03/02/2021 14:19    Procedures Procedures   Medications Ordered in ED Medications  furosemide (LASIX) injection 80 mg (has no administration in time range)  insulin aspart (novoLOG) injection 15 Units (15 Units Subcutaneous Given 03/02/21 1537)    ED Course  I have reviewed the triage vital signs and the nursing notes.  Pertinent labs & imaging results that were available during my care of the patient were reviewed by me and considered in my medical decision making (see chart for details).  Clinical Course as of 03/02/21 1537  Thu Mar 02, 2021  1434 DG Chest [place] 1 View [RS]  [phone] EKG has not yet been obtained as patient is not in her room with cardiac monitor, nurses aware and will transport patient to more suitable room once 1 becomes available.  [RS]  9233 Consult to hospitalist, Dr. Waldron Labs, who is agreeable to seeing this patient and admitting her to his service. I appreciate his collaboration in the care of this patient.  [RS]    Clinical Course User Index [RS] Marna Weniger, Gypsy Balsam, PA-C   MDM Rules/Calculators/A&P  25 year old female presents with concern for 3 pound weight gain in the last 4 days, history of anasarca and HHS secondary to poorly controlled type 1 diabetes, recently discharged.  Tachycardic and hypertensive on intake, vital signs otherwise normal.  Cardiac exam revealed tachycardia with regular rhythm, pulmonary exam is unremarkable.  Abdominal exam revealed abdominal distention with generalized tenderness to palpation and decreased bowel sounds.  There is bilateral lower extremity pitting edema and periorbital/facial edema.  Additionally there is edema of the  hands and forearms.   Based on physical exam and vital signs, feel this patient will require admission to the hospital, however will hold off on admission consult until laboratory studies have returned.  CBC revealed leukocytosis of 12,000, anemia with hemoglobin of 9, at patient baseline.  CMP with hyponatremia of 130, hyperglycemia of 413, and hypoalbuminemia of 1.9.  Magnesium is normal, lactic acid is normal, troponin is normal.  CXR negative for acute cardiopulmonary disease.  80 mg IV Lasix ordered, will administer subcu insulin as well for hyperglycemia.Workup consistent with nephrotic syndrome.  Will proceed with hospitalist consult at this time.  Consult to hospitalist as above for admission for anasarca and apparent nephrotic syndrome. Diamond [name] voiced understanding of her medical evaluation and treatment plan.  Each of her questions was answered to her expressed satisfaction.  She is amenable to plan for admission at this time.  This chart was dictated using voice recognition software, Dragon. Despite the best efforts of this provider to proofread and correct errors, errors may still occur which can change documentation meaning.  Final Clinical Impression(s) / ED Diagnoses Final diagnoses:  None    Rx / DC Orders ED Discharge Orders    None       Emeline Darling, PA-C 03/02/21 [address] Gypsy Balsam, PA-C 03/02/21 1628    Margette Fast, MD 03/03/21 [phone]

## 2021-03-02 NOTE — Telephone Encounter (Signed)
Pt l/m she is on her way to the hospital and will not  be here

## 2021-03-02 NOTE — Telephone Encounter (Signed)
Pt called complaining of leg swelling. She says it started 2 days after she got out of the hospital (on 02/25/21).  Pt says she is taking 21m lasix daily and is elevating her legs.  She says she worked yesterday but it was only for 3 hours and it was for a meeting so she wasn't standing the entire time.  She says that is the only time she has been to work since getting out of the hospital.  She only complains of leg pain and swelling.  She does not report any dyspnea.  She says she sometimes gets a bit of sob with walking but no sob at rest.  Pt was asked what her bs was today and she says she doesn't know because she just got up and hasn't checked it.  [facility] of leg pain, her bs was 900 according to the discharge summary that was reviewed.  Pt was asked to check her bs and she says her meter was downstairs and it would take her at least 5 minutes before she could have a result and she has PT at 10:45 (time of call was several minutes before 10am).  Discussed with pt that it was important to know what her bs is.  Suggested that she check her bs, go to her PT appt and we would call her back at 12 noon to finish discussion.  Pt agreed with plan.   referral to nephrology was faxed on 02/21/21.  Pt says she hasn't been contacted about that yet.

## 2021-03-02 NOTE — H&P (Signed)
TRH H&P   Patient Demographics:    Diamond [name], is a 25 y.o. female  MRN: [id number]   DOB - [birth year]  Admit Date - 03/02/2021  Outpatient Primary MD for the patient is [name], PA-C  Referring MD/NP/PA: PA [place]  Patient coming from: Home  Chief Complaint  Patient presents with  . Weight Gain      HPI:    Diamond [name]  is a 25 y.o. female, with past medical history of uncontrolled type 1 diabetes mellitus, with multiple admissions in the past for HHS, and hyperglycemia, anasarca, hypertension, she presents to ED due to significant weight gain recently discharged from [facility] 4 days ago, she reports 30 pounds weight gain since, reports her weight was 115 pounds on discharge, report today's weight 145, reports she has been compliant with low-salt diet, Lasix, she denies any fever, chills, chest pain, patient cardiac echo in 2021 with preserved EF 65 to 70%, with mild left ventricular hypertrophy, her BNP within normal limit, she does report some shortness of breath, intermittent cough, abdominal distention and decreased renal pain and whole body aching. - in ED, creatinine at 0.92, her albumin 1.9, LFTs within normal limit, she is with known anemia hemoglobin of 9.4, BNP normal at 89, chest x-ray with no evidence of volume overload, patient received 80 mg of IV Lasix and [facility] hospital[facility] consulted to admit.    Review of systems:    In addition to the HPI above,  No Fever-chills, fatigue, increased weakness No Headache, No changes with Vision or hearing, No problems swallowing food or Liquids, No Chest pain, reports cough and shortness of breath No Abdominal pain, No Nausea or Vommitting, Bowel movements are regular, No Blood in stool or Urine, No dysuria, report increased frequency and urgency No new skin rashes or bruises, No new joints  pains-aches,  No new weakness, tingling, numbness in any extremity, No recent weight gain or loss, No polyuria, polydypsia or polyphagia, No significant Mental Stressors.  A full 10 point Review of Systems was done, except as stated above, all other Review of Systems were negative.   With Past History of the following :    Past Medical History:  Diagnosis Date  . ADHD (attention deficit hyperactivity disorder) 2007  . Allergy   . Asthma   . Bipolar disorder ([place])   . Crohn's colitis ([place])    Dx Dr. Cherrie Gauze  . Depression    early childhood, trial of seroquel?  . Diabetic neuropathy ([place])   . Edema   . GERD (gastroesophageal reflux disease)   . Hypertension   . Pancreatitis   . Panic attacks   . PTSD (post-traumatic stress disorder)   . Stroke ([place]) 07/2020   R side  . Type 1 diabetes ([place]) 2008      Past Surgical History:  Procedure Laterality Date  . ADENOIDECTOMY    .  BIOPSY  02/01/2021   Procedure: BIOPSY;  Surgeon: Rogene Houston, MD;  [facility];  Service: Endoscopy;;  duodenal, esophageal,  . BIOPSY  02/03/2021   Procedure: BIOPSY;  Surgeon: Eloise Harman, DO;  [facility];  Service: Endoscopy;;  . COLONOSCOPY WITH PROPOFOL N/A 02/01/2021   Procedure: COLONOSCOPY WITH PROPOFOL;  Surgeon: Rogene Houston, MD;  [facility];  Service: Endoscopy;  Laterality: N/A;  . COLONOSCOPY WITH PROPOFOL N/A 02/03/2021   Procedure: COLONOSCOPY WITH PROPOFOL;  Surgeon: Eloise Harman, DO;  [facility];  Service: Endoscopy;  Laterality: N/A;  . ESOPHAGOGASTRODUODENOSCOPY (EGD) WITH PROPOFOL N/A 02/01/2021   Procedure: ESOPHAGOGASTRODUODENOSCOPY (EGD) WITH PROPOFOL;  Surgeon: Rogene Houston, MD;  [facility];  Service: Endoscopy;  Laterality: N/A;  . HERNIA REPAIR    . TONSILLECTOMY        Social History:     Social History   Tobacco Use  . Smoking status: Never Smoker  . Smokeless tobacco: Never  Used  Substance Use Topics  . Alcohol use: No      Family History :     Family History  Problem Relation Age of Onset  . Hypertension Mother   . Diabetes Mother   . Heart disease Mother   . Crohn's disease Father   . Alcohol abuse Father   . Drug abuse Father   . Mental illness Father   . Cancer Father   . Hypertension Maternal Aunt   . Seizures Maternal Aunt   . Depression Maternal Aunt   . Asthma Maternal Uncle   . Hypertension Maternal Uncle   . Cancer Maternal Grandmother   . Diabetes Maternal Grandmother   . Mental illness Maternal Grandfather      Home Medications:   Prior to Admission medications   Medication Sig Start Date End Date Taking? Authorizing Provider  albuterol (VENTOLIN HFA) 108 (90 Base) MCG/ACT inhaler Inhale 2 puffs into the lungs every 6 (six) hours as needed. For shortness of breath 02/15/21   [name], PA-C  citalopram (CELEXA) 10 MG tablet Take 10 mg by mouth daily. Patient not taking: Reported on 02/15/2021    [provider]  furosemide (LASIX) 20 MG tablet Take 2 tablets (40 mg total) by mouth daily. 02/15/21   [name], PA-C  gabapentin (NEURONTIN) 100 MG capsule Take 1 capsule (100 mg total) by mouth daily. 10/13/20   [name], PA-C  insulin glargine (LANTUS) 100 UNIT/ML injection Inject 0.3 mLs (30 Units total) into the skin daily. 02/03/21   Barton Dubois, MD  Insulin Glulisine (APIDRA [place]) Inject into the skin. ssi    [provider]  lidocaine (LIDODERM) 5 % Place 1 patch onto the skin daily. Remove & Discard patch within 12 hours or as directed by MD Patient not taking: Reported on 02/15/2021 02/12/21   Evalee Jefferson, PA-C  lisinopril (ZESTRIL) 20 MG tablet Take 1 tablet (20 mg total) by mouth daily. 02/15/21   [name], PA-C  metoCLOPramide (REGLAN) 5 MG tablet Take 1 tablet (5 mg total) by mouth every 8 (eight) hours as needed for refractory nausea / vomiting (refractory symptoms). Patient not  taking: Reported on 02/15/2021 02/03/21 02/03/22  Barton Dubois, MD  omeprazole (PRILOSEC) 40 MG capsule Take 1 capsule (40 mg total) by mouth daily. 02/15/21   [name], PA-C  ondansetron (ZOFRAN ODT) 8 MG disintegrating tablet Take 1 tablet (8 mg total) by mouth every 8 (eight) hours  as needed for nausea or vomiting. 02/15/21   [name], PA-C  potassium chloride (KLOR-CON) 10 MEQ tablet Take 1 tablet (10 mEq total) by mouth daily. Take While taking Lasix/furosemide 02/15/21   [name], PA-C  tamsulosin (FLOMAX) 0.4 MG CAPS capsule Take 1 capsule (0.4 mg total) by mouth daily. 02/15/21   [name], PA-C  traMADol (ULTRAM) 50 MG tablet Take 1 tablet (50 mg total) by mouth every 6 (six) hours as needed. Patient not taking: Reported on 02/15/2021 02/12/21   Evalee Jefferson, PA-C  traZODone (DESYREL) 50 MG tablet Take 50 mg by mouth at bedtime. Patient not taking: Reported on 02/15/2021    [provider]     Allergies:     Allergies  Allergen Reactions  . Amoxicillin Hives    Did it involve swelling of the face/tongue/throat, SOB, or low BP? no  Did it involve sudden or severe rash/hives, skin peeling, or any reaction on the inside of your mouth or nose? no Did you need to seek medical attention at a hospital or doctor's office? no When did it last happen?unk If all above answers are "NO", may proceed with cephalosporin use.  Marland Kitchen Dexamethasone Hives and Itching  . Doxycycline Itching and Swelling  . Penicillins Hives and Itching    Did it involve swelling of the face/tongue/throat, SOB, or low BP? no  Did it involve sudden or severe rash/hives, skin peeling, or any reaction on the inside of your mouth or nose? no Did you need to seek medical attention at a hospital or doctor's office? no When did it last happen?unk If all above answers are "NO", may proceed with cephalosporin use. Did it involve swelling of the face/tongue/throat, SOB, or low BP? no   Did it involve sudden or severe rash/hives, skin peeling, or any reaction on the inside of your mouth or nose? no Did you need to seek medical attention at a hospital or doctor's office? no When did it last happen?unk If all above answers are "NO", may proceed with cephalosporin use. Tolerated keflex May 2021 with no issue.   . Augmentin [Amoxicillin-Pot Clavulanate] Other (See Comments)    Reaction unknown  . Ceftriaxone Sodium In Dextrose   . Clavulanic Acid   . Adhesive [Tape] Itching and Rash  . Latex Itching and Rash  . Other Itching and Rash  . Pineapple Rash    Rash on tongue and throat     Physical Exam:   Vitals  Blood pressure (!) 160/119, pulse (!) 113, temperature 97.6 F (36.4 C), temperature source Temporal, resp. rate (!) 27, height 4' 11"  (1.499 m), weight 66.5 kg, SpO2 98 %.   1. General , well-developed female, laying in bed in no apparent distress  2. Normal affect and insight, Not Suicidal or Homicidal, Awake Alert, Oriented X 3.  3. No F.N deficits, ALL C.Nerves Intact, Strength 5/5 all 4 extremities, Sensation intact all 4 extremities, Plantars down going.  4. Ears and Eyes appear Normal, Conjunctivae clear, PERRLA. Moist Oral Mucosa.  He does have periorbital edema  5. Supple Neck, No JVD, No cervical lymphadenopathy appriciated, No Carotid Bruits.  6. Symmetrical Chest wall movement, Good air movement bilaterally, CTAB.  7. RRR, No Gallops, Rubs or Murmurs, No Parasternal Heave.  8. Positive Bowel Sounds, Abdomen Soft, No tenderness, No organomegaly appriciated,No rebound -guarding or rigidity.  9.  No Cyanosis, Normal Skin Turgor, No Skin Rash or Bruise.  Significant with significant anasarca, with lower extremity +3 edema, with abdominal  swelling as well,  10. Good muscle tone,  joints appear normal , no effusions, Normal ROM.  11. No Palpable Lymph Nodes in Neck or Axillae   Patient nurse Lyn Henri, was present at bedside during  exam.   Data Review:    CBC Recent Labs  Lab 03/02/21 1419  WBC 12.1*  HGB 9.4*  HCT 29.0*  PLT 559*  MCV 89.5  MCH 29.0  MCHC 32.4  RDW 12.4  LYMPHSABS 1.9  MONOABS 0.9  EOSABS 0.6*  BASOSABS 0.1   ------------------------------------------------------------------------------------------------------------------  Chemistries  Recent Labs  Lab 03/02/21 1419  NA 130*  K 4.1  CL 98  CO2 24  GLUCOSE 413*  BUN 18  CREATININE 0.92  CALCIUM 8.6*  MG 2.1  AST 11*  ALT 11  ALKPHOS 96  BILITOT 0.6   ------------------------------------------------------------------------------------------------------------------ estimated creatinine clearance is 78.1 mL/min (by C-G formula based on SCr of 0.92 mg/dL). ------------------------------------------------------------------------------------------------------------------ No results for input(s): TSH, T4TOTAL, T3FREE, THYROIDAB in the last 72 hours.  Invalid input(s): FREET3  Coagulation profile Recent Labs  Lab 03/02/21 1419  INR 1.0   ------------------------------------------------------------------------------------------------------------------- No results for input(s): DDIMER in the last 72 hours. -------------------------------------------------------------------------------------------------------------------  Cardiac Enzymes No results for input(s): CKMB, TROPONINI, MYOGLOBIN in the last 168 hours.  Invalid input(s): CK ------------------------------------------------------------------------------------------------------------------    Component Value Date/Time   BNP 89.0 03/02/2021 1419     ---------------------------------------------------------------------------------------------------------------  Urinalysis    Component Value Date/Time   COLORURINE STRAW (A) 02/11/2021 2105   APPEARANCEUR CLEAR 02/11/2021 2105   APPEARANCEUR Clear 11/15/2020 1531   LABSPEC 1.020 02/11/2021 2105   PHURINE 7.0  02/11/2021 2105   GLUCOSEU >=500 (A) 02/11/2021 2105   HGBUR NEGATIVE 02/11/2021 2105   BILIRUBINUR NEGATIVE 02/11/2021 2105   BILIRUBINUR NEG 12/05/2020 1353   BILIRUBINUR Negative 11/15/2020 1531   KETONESUR 5 (A) 02/11/2021 2105   PROTEINUR >=300 (A) 02/11/2021 2105   UROBILINOGEN 0.2 12/05/2020 1353   UROBILINOGEN 0.2 09/13/2012 2014   NITRITE NEGATIVE 02/11/2021 2105   LEUKOCYTESUR NEGATIVE 02/11/2021 2105    ----------------------------------------------------------------------------------------------------------------   Imaging Results:    DG Chest Port 1 View  Result Date: 03/02/2021 CLINICAL DATA:  Shortness of breath Gain 30 pound in 4 days Diffuse swelling EXAM: PORTABLE CHEST 1 VIEW COMPARISON:  02/02/2021 FINDINGS: The heart size and mediastinal contours are within normal limits. Both lungs are clear. The visualized skeletal structures are unremarkable. IMPRESSION: No active disease. Electronically Signed   By: Miachel Roux M.D.   On: 03/02/2021 14:19      Assessment & Plan:    Active Problems:   Uncontrolled type 1 diabetes mellitus with hyperglycemia (HCC)   GERD (gastroesophageal reflux disease)   Bilateral lower extremity edema   Anasarca   Essential hypertension, benign   Crohn's disease ([place])    Anasarca -This is most likely in the setting of nephrotic syndrome, given anasarca, hypoalbuminemia, and proteinuria> 3 g per 24 hours(4 hours collection last November) -We will start on IV Lasix 40 mg IV twice daily. -We will keep on strict ins and outs -We will keep on low-salt diet. -We will obtain 24-hour urine protein. -Has been consulted, will evaluate. -BNP is normal at 89, echo last year with a preserved EF, mild hypertrophy, low clinical suspicion for CHF exacerbation.  Diabetes mellitus, type I, uncontrolled -We will resume Lantus 30 units (patient cannot recall what type of insulin she is currently taking), will add insulin sliding scale, will check  A1c, will keep on insulin sliding  scale.  Hypertension -continue with lisinopril  Normocytic normochromic anemia -At baseline,. -We will obtain anemia panel   Thrombocytosis -We will continue to monitor  Albuminemia/proteinuria -Likely due to nephrotic syndrome   DVT Prophylaxis  Lovenox  AM Labs Ordered, also please review Full Orders  Family Communication: Admission, patients condition and plan of care including tests being ordered have been discussed with the patient  who indicate understanding and agree with the plan and Code Status.  Code Status Full  Likely DC to  Home  Condition GUARDED   Consults called: renal    Admission status: inpatient    Time spent in minutes : 60 minutes   Phillips Climes M.D on 03/02/2021 at 3:55 PM   [facility] - Office  [phone]

## 2021-03-03 DIAGNOSIS — K219 Gastro-esophageal reflux disease without esophagitis: Secondary | ICD-10-CM

## 2021-03-03 DIAGNOSIS — R600 Localized edema: Secondary | ICD-10-CM

## 2021-03-03 LAB — COMPREHENSIVE METABOLIC PANEL
ALT: 11 U/L (ref 0–44)
AST: 13 U/L — ABNORMAL LOW (ref 15–41)
Albumin: 1.4 g/dL — ABNORMAL LOW (ref 3.5–5.0)
Alkaline Phosphatase: 78 U/L (ref 38–126)
Anion gap: 8 (ref 5–15)
BUN: 16 mg/dL (ref 6–20)
CO2: 26 mmol/L (ref 22–32)
Calcium: 8.1 mg/dL — ABNORMAL LOW (ref 8.9–10.3)
Chloride: 100 mmol/L (ref 98–111)
Creatinine, Ser: 1 mg/dL (ref 0.44–1.00)
GFR, Estimated: >60 mL/min (ref >60)
Glucose, Bld: 235 mg/dL — ABNORMAL HIGH (ref 70–99)
Potassium: 3.9 mmol/L (ref 3.5–5.1)
Sodium: 134 mmol/L — ABNORMAL LOW (ref 135–145)
Total Bilirubin: 0.3 mg/dL (ref 0.3–1.2)
Total Protein: 5.8 g/dL — ABNORMAL LOW (ref 6.5–8.1)

## 2021-03-03 LAB — GLUCOSE, CAPILLARY
Glucose-Capillary: 169 mg/dL — ABNORMAL HIGH (ref 70–99)
Glucose-Capillary: 77 mg/dL (ref 70–99)
Glucose-Capillary: 83 mg/dL (ref 70–99)
Glucose-Capillary: 38 mg/dL — CL (ref 70–99)
Glucose-Capillary: 176 mg/dL — ABNORMAL HIGH (ref 70–99)
Glucose-Capillary: 167 mg/dL — ABNORMAL HIGH (ref 70–99)

## 2021-03-03 LAB — CBC
HCT: 25.5 % — ABNORMAL LOW (ref 36.0–46.0)
Hemoglobin: 8.2 g/dL — ABNORMAL LOW (ref 12.0–15.0)
MCH: 29.1 pg (ref 26.0–34.0)
MCHC: 32.2 g/dL (ref 30.0–36.0)
MCV: 90.4 fL (ref 80.0–100.0)
Platelets: 480 10*3/uL — ABNORMAL HIGH (ref 150–400)
RBC: 2.82 MIL/uL — ABNORMAL LOW (ref 3.87–5.11)
RDW: 12.7 % (ref 11.5–15.5)
WBC: 12.6 10*3/uL — ABNORMAL HIGH (ref 4.0–10.5)
nRBC: 0 % (ref 0.0–0.2)

## 2021-03-03 LAB — C4 COMPLEMENT: Complement C4, Body Fluid: 48 mg/dL — ABNORMAL HIGH (ref 12–38)

## 2021-03-03 LAB — LIPID PANEL
Cholesterol: 165 mg/dL (ref 0–200)
HDL: 67 mg/dL (ref >40)
LDL Cholesterol: 70 mg/dL (ref 0–99)
Total CHOL/HDL Ratio: 2.5 RATIO
Triglycerides: 140 mg/dL (ref <150)
VLDL: 28 mg/dL (ref 0–40)

## 2021-03-03 LAB — D-DIMER, QUANTITATIVE: D-Dimer, Quant: 1.2 ug/mL-FEU — ABNORMAL HIGH (ref 0.00–0.50)

## 2021-03-03 LAB — GLOMERULAR BASEMENT MEMBRANE ANTIBODIES: GBM Ab: 6 units (ref 0–20)

## 2021-03-03 LAB — ANTINUCLEAR ANTIBODIES, IFA: ANA Ab, IFA: NEGATIVE

## 2021-03-03 LAB — C3 COMPLEMENT: C3 Complement: 201 mg/dL — ABNORMAL HIGH (ref 82–167)

## 2021-03-03 LAB — ANTISTREPTOLYSIN O TITER: ASO: 21 IU/mL (ref 0.0–200.0)

## 2021-03-03 LAB — ANTI-DNA ANTIBODY, DOUBLE-STRANDED: ds DNA Ab: <1 IU/mL (ref 0–9)

## 2021-03-03 MED ORDER — SODIUM CHLORIDE 0.9 % IV SOLN
510.0000 mg | Freq: Once | INTRAVENOUS | Status: AC
  Administered 2021-03-03: 510 mg via INTRAVENOUS
  Filled 2021-03-03: qty 17

## 2021-03-03 MED ORDER — TRAMADOL HCL 50 MG PO TABS
50.0000 mg | ORAL_TABLET | Freq: Four times a day (QID) | ORAL | Status: DC | PRN
  Administered 2021-03-03 – 2021-03-04 (×3): 50 mg via ORAL
  Filled 2021-03-03 (×3): qty 1

## 2021-03-03 MED ORDER — INSULIN ASPART 100 UNIT/ML ~~LOC~~ SOLN
8.0000 [IU] | Freq: Three times a day (TID) | SUBCUTANEOUS | Status: DC
  Administered 2021-03-03: 8 [IU] via SUBCUTANEOUS

## 2021-03-03 MED ORDER — DEXTROSE 50 % IV SOLN
INTRAVENOUS | Status: AC
  Administered 2021-03-03: 50 mL
  Filled 2021-03-03: qty 50

## 2021-03-03 MED ORDER — ACETAMINOPHEN 325 MG PO TABS
650.0000 mg | ORAL_TABLET | Freq: Four times a day (QID) | ORAL | Status: DC
  Administered 2021-03-03 – 2021-03-10 (×23): 650 mg via ORAL
  Filled 2021-03-03 (×25): qty 2

## 2021-03-03 MED ORDER — ONDANSETRON HCL 4 MG/2ML IJ SOLN
4.0000 mg | Freq: Four times a day (QID) | INTRAMUSCULAR | Status: DC | PRN
  Administered 2021-03-03 – 2021-03-09 (×12): 4 mg via INTRAVENOUS
  Filled 2021-03-03 (×12): qty 2

## 2021-03-03 MED ORDER — PROCHLORPERAZINE EDISYLATE 10 MG/2ML IJ SOLN
10.0000 mg | Freq: Four times a day (QID) | INTRAMUSCULAR | Status: DC | PRN
  Administered 2021-03-03 – 2021-03-10 (×10): 10 mg via INTRAVENOUS
  Filled 2021-03-03 (×11): qty 2

## 2021-03-03 MED ORDER — ACETAMINOPHEN 325 MG PO TABS: 650.0000 mg | ORAL_TABLET | Freq: Four times a day (QID) | ORAL | Status: DC | PRN

## 2021-03-03 MED ORDER — INSULIN ASPART 100 UNIT/ML ~~LOC~~ SOLN
10.0000 [IU] | Freq: Three times a day (TID) | SUBCUTANEOUS | Status: DC
  Administered 2021-03-03: 10 [IU] via SUBCUTANEOUS

## 2021-03-03 NOTE — Consult Note (Signed)
Reason for Consult: Anasarca and possible Nephrotic Syndrome Referring Physician: Waldron Labs, MD  Diamond [name] is an 25 y.o. female with a PMH significant for poorly controlled type 1 DM since the age of 65, HTN, depression, and h/o pancreatitis who has been hospitalized multiple times over the past year with DKA/HHS and anasarca.  She was most recently discharged on 02/25/21 from [facility] due to anasarca and hyperglycemia.  She presented to [facility] ED with a 4 day history of worsening DOE, whole body edema, and 30 lbs weight gain despite increasing doses of furosemide (in actuality was only 6 lbs according to discharge records).  Labs were notable for glucose 413, alb 1.9, BNP 89, Na 130, Hgb 9.4, UA with 100 mg/dL.  We were consulted to evaluate for possible Nephrotic syndrome as a contributor to her recurring anasarca.  She has not had a 24 hour urine collection performed in the past but did have a UPC of 3.95 mg/g in November of 2021.    She has never been seen by a Nephrologist.  She denies any h/o retinopathy.  She does note periorbital edema worse in the morning, and foamy/frothy urine.  She denies any NSAIDs, dysuria, pyuria, hematuria, urgency, frequency, or retention.    Trend in Creatinine: Creatinine, Ser  Date/Time Value Ref Range Status  03/03/2021 04:36 AM 1.00 0.44 - 1.00 mg/dL Final  03/02/2021 02:19 PM 0.92 0.44 - 1.00 mg/dL Final  02/11/2021 07:11 PM 0.64 0.44 - 1.00 mg/dL Final  02/03/2021 05:18 AM 0.91 0.44 - 1.00 mg/dL Final  02/02/2021 05:18 AM 0.72 0.44 - 1.00 mg/dL Final  02/01/2021 04:36 AM 0.64 0.44 - 1.00 mg/dL Final  01/31/2021 03:44 AM 0.60 0.44 - 1.00 mg/dL Final  01/30/2021 02:24 PM 0.70 0.44 - 1.00 mg/dL Final  01/30/2021 10:06 AM 0.80 0.44 - 1.00 mg/dL Final  01/30/2021 09:07 AM 0.78 0.44 - 1.00 mg/dL Final  01/27/2021 05:36 AM 0.88 0.44 - 1.00 mg/dL Final  01/26/2021 04:20 AM 0.71 0.44 - 1.00 mg/dL Final  01/25/2021 08:34 AM 0.79 0.44 - 1.00 mg/dL Final   01/24/2021 04:20 AM 0.54 0.44 - 1.00 mg/dL Final  01/23/2021 11:49 PM 0.68 0.44 - 1.00 mg/dL Final  01/23/2021 06:58 PM 0.84 0.44 - 1.00 mg/dL Final  01/23/2021 06:06 PM 0.77 0.44 - 1.00 mg/dL Final  11/30/2020 09:36 AM 0.83 0.44 - 1.00 mg/dL Final  11/05/2020 03:42 PM 1.01 (H) 0.44 - 1.00 mg/dL Final  10/21/2020 05:54 AM 0.98 0.44 - 1.00 mg/dL Final  10/21/2020 05:54 AM 0.94 0.44 - 1.00 mg/dL Final  10/20/2020 04:51 AM 0.87 0.44 - 1.00 mg/dL Final  10/19/2020 01:14 PM 0.80 0.44 - 1.00 mg/dL Final  10/19/2020 09:36 AM 0.83 0.44 - 1.00 mg/dL Final  10/19/2020 05:32 AM 0.84 0.44 - 1.00 mg/dL Final  10/19/2020 01:50 AM 0.87 0.44 - 1.00 mg/dL Final  10/18/2020 09:47 PM 0.74 0.44 - 1.00 mg/dL Final  10/18/2020 12:12 PM 0.83 0.44 - 1.00 mg/dL Final  08/26/2020 05:07 AM 0.64 0.44 - 1.00 mg/dL Final  08/25/2020 10:48 PM 0.77 0.44 - 1.00 mg/dL Final  08/25/2020 05:54 PM 0.96 0.44 - 1.00 mg/dL Final  04/30/2020 05:39 PM 0.66 0.44 - 1.00 mg/dL Final  01/03/2020 05:37 PM 0.83 0.44 - 1.00 mg/dL Final  12/24/2019 08:12 PM 0.45 0.44 - 1.00 mg/dL Final  09/13/2012 08:40 PM 0.80 0.47 - 1.00 mg/dL Final  09/13/2012 07:58 PM 0.61 0.47 - 1.00 mg/dL Final  08/23/2012 12:58 AM 0.60 0.47 - 1.00 mg/dL Final  08/22/2012  10:18 PM 0.56 0.47 - 1.00 mg/dL Final  06/26/2012 07:15 PM 0.70 0.47 - 1.00 mg/dL Final  02/15/2011 03:42 PM 0.71 0.4 - 1.2 mg/dL Final  10/19/2009 07:29 AM 0.59 0.4 - 1.2 mg/dL Final  04/18/2009 04:05 PM 0.69 0.4 - 1.2 mg/dL Final    PMH:   Past Medical History:  Diagnosis Date  . ADHD (attention deficit hyperactivity disorder) 2007  . Allergy   . Asthma   . Bipolar disorder ([place])   . Crohn's colitis ([place])    Dx Dr. Cherrie Gauze  . Depression    early childhood, trial of seroquel?  . Diabetic neuropathy ([place])   . Edema   . GERD (gastroesophageal reflux disease)   . Hypertension   . Pancreatitis   . Panic attacks   . PTSD (post-traumatic stress disorder)   . Stroke  ([place]) 07/2020   R side  . Type 1 diabetes ([place]) 2008    PSH:   Past Surgical History:  Procedure Laterality Date  . ADENOIDECTOMY    . BIOPSY  02/01/2021   Procedure: BIOPSY;  Surgeon: Rogene Houston, MD;  [facility];  Service: Endoscopy;;  duodenal, esophageal,  . BIOPSY  02/03/2021   Procedure: BIOPSY;  Surgeon: Eloise Harman, DO;  [facility];  Service: Endoscopy;;  . COLONOSCOPY WITH PROPOFOL N/A 02/01/2021   Procedure: COLONOSCOPY WITH PROPOFOL;  Surgeon: Rogene Houston, MD;  [facility];  Service: Endoscopy;  Laterality: N/A;  . COLONOSCOPY WITH PROPOFOL N/A 02/03/2021   Procedure: COLONOSCOPY WITH PROPOFOL;  Surgeon: Eloise Harman, DO;  [facility];  Service: Endoscopy;  Laterality: N/A;  . ESOPHAGOGASTRODUODENOSCOPY (EGD) WITH PROPOFOL N/A 02/01/2021   Procedure: ESOPHAGOGASTRODUODENOSCOPY (EGD) WITH PROPOFOL;  Surgeon: Rogene Houston, MD;  [facility];  Service: Endoscopy;  Laterality: N/A;  . HERNIA REPAIR    . TONSILLECTOMY      Allergies:  Allergies  Allergen Reactions  . Amoxicillin Hives    Did it involve swelling of the face/tongue/throat, SOB, or low BP? no  Did it involve sudden or severe rash/hives, skin peeling, or any reaction on the inside of your mouth or nose? no Did you need to seek medical attention at a hospital or doctor's office? no When did it last happen?unk If all above answers are "NO", may proceed with cephalosporin use.  Marland Kitchen Dexamethasone Hives and Itching  . Doxycycline Itching and Swelling  . Penicillins Hives and Itching    Did it involve swelling of the face/tongue/throat, SOB, or low BP? no  Did it involve sudden or severe rash/hives, skin peeling, or any reaction on the inside of your mouth or nose? no Did you need to seek medical attention at a hospital or doctor's office? no When did it last happen?unk If all above answers are "NO", may proceed with cephalosporin  use. Did it involve swelling of the face/tongue/throat, SOB, or low BP? no  Did it involve sudden or severe rash/hives, skin peeling, or any reaction on the inside of your mouth or nose? no Did you need to seek medical attention at a hospital or doctor's office? no When did it last happen?unk If all above answers are "NO", may proceed with cephalosporin use. Tolerated keflex May 2021 with no issue.   . Augmentin [Amoxicillin-Pot Clavulanate] Other (See Comments)    Reaction unknown  . Ceftriaxone Sodium In Dextrose   . Clavulanic Acid   . Adhesive [Tape] Itching and Rash  .  Latex Itching and Rash  . Other Itching and Rash  . Pineapple Rash    Rash on tongue and throat    Medications:   Prior to Admission medications   Medication Sig Start Date End Date Taking? Authorizing Provider  albuterol (VENTOLIN HFA) 108 (90 Base) MCG/ACT inhaler Inhale 2 puffs into the lungs every 6 (six) hours as needed. For shortness of breath 02/15/21  Yes [name], PA-C  furosemide (LASIX) 40 MG tablet Take 40 mg by mouth daily. 02/27/21  Yes [provider]  lisinopril (ZESTRIL) 20 MG tablet Take 1 tablet (20 mg total) by mouth daily. 02/15/21  Yes [name], PA-C  omeprazole (PRILOSEC) 40 MG capsule Take 1 capsule (40 mg total) by mouth daily. 02/15/21  Yes [name], PA-C  ondansetron (ZOFRAN ODT) 8 MG disintegrating tablet Take 1 tablet (8 mg total) by mouth every 8 (eight) hours as needed for nausea or vomiting. 02/15/21  Yes [name], PA-C  potassium chloride (MICRO-K) 10 MEQ CR capsule Take 10 mEq by mouth daily. 02/27/21  Yes [provider]  tamsulosin (FLOMAX) 0.4 MG CAPS capsule Take 1 capsule (0.4 mg total) by mouth daily. 02/15/21  Yes [name], PA-C  traZODone (DESYREL) 50 MG tablet Take 50 mg by mouth at bedtime.   Yes [provider]  gabapentin (NEURONTIN) 100 MG capsule Take 1 capsule (100 mg total) by mouth daily. Patient not  taking: Reported on 03/02/2021 10/13/20   [name], PA-C  insulin glargine (LANTUS) 100 UNIT/ML injection Inject 15 Units into the skin at bedtime. 02/25/21 03/27/21  [provider]  insulin lispro (HUMALOG) 100 UNIT/ML KwikPen Inject 10-16 Units into the skin 3 (three) times daily before meals. 02/25/21 03/27/21  [provider]  metoCLOPramide (REGLAN) 5 MG tablet Take 1 tablet (5 mg total) by mouth every 8 (eight) hours as needed for refractory nausea / vomiting (refractory symptoms). 02/03/21 02/03/22  Barton Dubois, MD    Inpatient medications: . enoxaparin (LOVENOX) injection  40 mg Subcutaneous Q24H  . furosemide  40 mg Intravenous BID  . insulin aspart  0-15 Units Subcutaneous TID WC  . insulin aspart  0-5 Units Subcutaneous QHS  . insulin aspart  10 Units Subcutaneous TID WC  . insulin glargine  30 Units Subcutaneous Daily  . lisinopril  20 mg Oral Daily  . pantoprazole  40 mg Oral Daily  . potassium chloride  10 mEq Oral Daily  . tamsulosin  0.4 mg Oral Daily    Discontinued Meds:   Medications Discontinued During This Encounter  Medication Reason  . insulin glargine (LANTUS) 100 UNIT/ML injection Patient Preference  . potassium chloride (KLOR-CON) 10 MEQ tablet Patient Preference  . Insulin Glulisine (APIDRA [place]) Patient Preference  . lidocaine (LIDODERM) 5 % Patient Preference  . traMADol (ULTRAM) 50 MG tablet Patient Preference  . furosemide (LASIX) 20 MG tablet Dose change  . hydrochlorothiazide (HYDRODIURIL) 25 MG tablet Patient Preference  . citalopram (CELEXA) 10 MG tablet Patient Preference  . gabapentin (NEURONTIN) capsule 100 mg Patient has not taken in last 30 days    Social History:  reports that she has never smoked. She has never used smokeless tobacco. She reports that she does not drink alcohol and does not use drugs.  Family History:   Family History  Problem Relation Age of Onset  . Hypertension Mother   . Diabetes Mother   . Heart  disease Mother   . Crohn's disease Father   . Alcohol abuse  Father   . Drug abuse Father   . Mental illness Father   . Cancer Father   . Hypertension Maternal Aunt   . Seizures Maternal Aunt   . Depression Maternal Aunt   . Asthma Maternal Uncle   . Hypertension Maternal Uncle   . Cancer Maternal Grandmother   . Diabetes Maternal Grandmother   . Mental illness Maternal Grandfather     Pertinent items are noted in HPI. Weight change:  No intake or output data in the 24 hours ending 03/03/21 1006 BP 128/88 (BP Location: Left Arm)   Pulse (!) 113 Comment: Re checked manually 110  Temp 99.3 F (37.4 C) (Oral)   Resp 18   Ht 4' 11"  (1.499 m)   Wt 63.8 kg   SpO2 99%   BMI 28.41 kg/m  Vitals:   03/03/21 0039 03/03/21 0238 03/03/21 0504 03/03/21 0921  BP: 118/79 (!) 128/92 (!) 133/94 128/88  Pulse: (!) 118 (!) 114 (!) 110 (!) 113  Resp: 18 18 16 18   Temp:  99.8 F (37.7 C) 100.2 F (37.9 C) 99.3 F (37.4 C)  TempSrc:  Oral Oral Oral  SpO2: 93% 95% 96% 99%  Weight:   63.8 kg   Height:   4' 11"  (1.499 m)      General appearance: alert, cooperative and no distress Head: Normocephalic, without obvious abnormality, atraumatic Eyes: bilateral periorbital edema Resp: clear to auscultation bilaterally Cardio: regular rate and rhythm, S1, S2 normal, no murmur, click, rub or gallop GI: soft, non-tender; bowel sounds normal; no masses,  no organomegaly Extremities: edema 1+ anasarca to hips  Labs: Basic Metabolic Panel: Recent Labs  Lab 03/02/21 1419 03/03/21 0436  NA 130* 134*  K 4.1 3.9  CL 98 100  CO2 24 26  GLUCOSE 413* 235*  BUN 18 16  CREATININE 0.92 1.00  ALBUMIN 1.9* 1.4*  CALCIUM 8.6* 8.1*   Liver Function Tests: Recent Labs  Lab 03/02/21 1419 03/03/21 0436  AST 11* 13*  ALT 11 11  ALKPHOS 96 78  BILITOT 0.6 0.3  PROT 7.2 5.8*  ALBUMIN 1.9* 1.4*   No results for input(s): LIPASE, AMYLASE in the last 168 hours. No results for input(s): AMMONIA in  the last 168 hours. CBC: Recent Labs  Lab 03/02/21 1419 03/03/21 0436  WBC 12.1* 12.6*  NEUTROABS 8.6*  --   HGB 9.4* 8.2*  HCT 29.0* 25.5*  MCV 89.5 90.4  PLT 559* 480*   PT/INR: @LABRCNTIP (inr:5) Cardiac Enzymes: )No results for input(s): CKTOTAL, CKMB, CKMBINDEX, TROPONINI in the last 168 hours. CBG: Recent Labs  Lab 03/02/21 1521 03/02/21 1818 03/02/21 2144 03/03/21 0753  GLUCAP 383* 289* 182* 169*    Iron Studies:  Recent Labs  Lab 03/02/21 1659  IRON 19*  TIBC 234*  FERRITIN 115    Xrays/Other Studies: DG Chest Port 1 View  Result Date: 03/02/2021 CLINICAL DATA:  Shortness of breath Gain 30 pound in 4 days Diffuse swelling EXAM: PORTABLE CHEST 1 VIEW COMPARISON:  02/02/2021 FINDINGS: The heart size and mediastinal contours are within normal limits. Both lungs are clear. The visualized skeletal structures are unremarkable. IMPRESSION: No active disease. Electronically Signed   By: Miachel Roux M.D.   On: 03/02/2021 14:19     Assessment/Plan: 1.  Anasarca - ECHO in November 2021 showed EF 65-70%, no mention of diastolic dysfunction.  She has had nephrotic range proteinuria in the past along with poorly controlled type 1 DM.  Renal US with some increased echogenicity  bilaterally.  1. Obtain 24 hour urine for protein 2. Will order serologies 3. Discussed role for renal biopsy (although most likely due to poorly controlled DM) but if significant proteinuria is present will consult IR for renal biopsy. 4. Agree with IV lasix and follow UOP and daily weights. 5. Low sodium diet 6. Increase lisinopril to 40 mg daily and follow.  2. CKD stage II - presumably due to poorly controlled type 1 DM, however she denies any history of diabetic retinopathy.  Workup as above.  3. Uncontrolled type 1 DM with multiple hospitalizations related to hyperglycemia.  Stress compliance with insulin and diet. 4. HTN - would increase lisinopril to 40 mg daily to help with BP control and  proteinuria. 5. Anemia, normocytic.  Low iron stores, recommend IV iron.   Will not see patient over the weekend but will re-evaluate her on Monday 03/06/21 as well as results of workup and decide upon necessity of renal biopsy at that time.   Broadus John A Kasen Adduci 03/03/2021, 10:06 AM

## 2021-03-03 NOTE — Progress Notes (Signed)
PROGRESS NOTE   Diamond [name]  ION:[id number] DOB: 12-Oct-1996 DOA: 03/02/2021 PCP: [name], PA-C   Chief Complaint  Patient presents with  . Weight Gain   Level of care: Telemetry  Brief Admission History:  25 y.o. female, with past medical history of uncontrolled type 1 diabetes mellitus, with multiple admissions in the past for HHS, and hyperglycemia, anasarca, hypertension, she presents to ED due to significant weight gain recently discharged from [facility] 4 days ago, she reports 30 pounds weight gain since, reports her weight was 115 pounds on discharge, report today's weight 145, reports she has been compliant with low-salt diet, Lasix, she denies any fever, chills, chest pain, patient cardiac echo in 2021 with preserved EF 65 to 70%, with mild left ventricular hypertrophy, her BNP within normal limit, she does report some shortness of breath, intermittent cough, abdominal distention and decreased renal pain and whole body aching. - in ED, creatinine at 0.92, her albumin 1.9, LFTs within normal limit, she is with known anemia hemoglobin of 9.4, BNP normal at 89, chest x-ray with no evidence of volume overload, patient received 80 mg of IV Lasix and [facility] hospitalist consulted to admit.  Assessment & Plan:   Active Problems:   Uncontrolled type 1 diabetes mellitus with hyperglycemia (HCC)   GERD (gastroesophageal reflux disease)   Bilateral lower extremity edema   Anasarca   Essential hypertension, benign   Crohn's disease ([place])  Anasarca - Pt being evaluated for nephrotic syndrome, following 24 hour urine and appreciate nephrology consultation.  Continue IV lasix.   Type 1 DM uncontrolled with possible renal complications - Pt has been restarted on home Lantus 30 units daily with prandial NovoLog 10 units 3 times daily with meals.  Monitor CBG 5 times per day as needed.  Supplemental sliding scale coverage ordered as well.  Proteinuria-24-hour urine in  progress, nephrology increase lisinopril to 40 mg daily.  Anticipate possible need for renal biopsy on 03/06/2021-determination made by nephrology team on Monday.  Poorly controlled hypertension-blood pressures better controlled with increased dose of lisinopril ordered by nephrology.  Normocytic anemia-appreciate nephrology consult and recommendations for IV iron infusion which has been ordered.  Leukocytosis-possibly reactive no signs of infection have been found.  Check blood cultures x2.  Follow CBC with differential.  Fever-unknown cause, no source of infection has been found we have ordered blood cultures x2, Tylenol ordered as needed.  Sinus tachycardia-unknown cause check venous Dopplers of the lower extremity and check D-dimer if positive would likely pursue CTA chest.   DVT prophylaxis: Enoxaparin Code Status: Full Family Communication: Updated patient at bedside and verbalized understanding Disposition: Home when medically stable Status is: Inpatient  Remains inpatient appropriate because:IV treatments appropriate due to intensity of illness or inability to take PO and Inpatient level of care appropriate due to severity of illness   Dispo: The patient is from: Home              Anticipated d/c is to: Home              Patient currently is not medically stable to d/c.   Difficult to place patient No  Consultants:   nephrology  Procedures:     Antimicrobials:    Subjective: Pt reports that she is urinating frequently.  No CP, No SOB.  Having some palpitations.   Objective: Vitals:   03/03/21 0238 03/03/21 0504 03/03/21 0921 03/03/21 1507  BP: (!) 128/92 (!) 133/94 128/88 117/78  Pulse: (!) 114 Marland Kitchen)  110 (!) 113 (!) 113  Resp: 18 16 18 18   Temp: 99.8 F (37.7 C) 100.2 F (37.9 C) 99.3 F (37.4 C) (!) 100.7 F (38.2 C)  TempSrc: Oral Oral Oral Oral  SpO2: 95% 96% 99% 95%  Weight:  63.8 kg    Height:  4' 11"  (1.499 m)      Intake/Output Summary (Last 24  hours) at 03/03/2021 1642 Last data filed at 03/03/2021 1130 Gross per 24 hour  Intake 480 ml  Output --  Net 480 ml   Filed Weights   03/02/21 1151 03/02/21 1906 03/03/21 0504  Weight: 66.5 kg 64 kg 63.8 kg    Examination:  General exam: Appears calm and comfortable  Respiratory system: Clear to auscultation. Respiratory effort normal. Cardiovascular system: normal S1 & S2 heard. No JVD, murmurs, rubs, gallops or clicks. No pedal edema. Gastrointestinal system: Abdomen is nondistended, soft and nontender. No organomegaly or masses felt. Normal bowel sounds heard. Central nervous system: Alert and oriented. No focal neurological deficits. Extremities: Symmetric 5 x 5 power. Skin: No rashes, lesions or ulcers Psychiatry: Judgement and insight appear normal. Mood & affect appropriate.   Data Reviewed: I have personally reviewed following labs and imaging studies  CBC: Recent Labs  Lab 03/02/21 1419 03/03/21 0436  WBC 12.1* 12.6*  NEUTROABS 8.6*  --   HGB 9.4* 8.2*  HCT 29.0* 25.5*  MCV 89.5 90.4  PLT 559* 480*    Basic Metabolic Panel: Recent Labs  Lab 03/02/21 1419 03/03/21 0436  NA 130* 134*  K 4.1 3.9  CL 98 100  CO2 24 26  GLUCOSE 413* 235*  BUN 18 16  CREATININE 0.92 1.00  CALCIUM 8.6* 8.1*  MG 2.1  --     GFR: Estimated Creatinine Clearance: 70.4 mL/min (by C-G formula based on SCr of 1 mg/dL).  Liver Function Tests: Recent Labs  Lab 03/02/21 1419 03/03/21 0436  AST 11* 13*  ALT 11 11  ALKPHOS 96 78  BILITOT 0.6 0.3  PROT 7.2 5.8*  ALBUMIN 1.9* 1.4*    CBG: Recent Labs  Lab 03/02/21 1818 03/02/21 2144 03/03/21 0753 03/03/21 1122 03/03/21 1611  GLUCAP 289* 182* 169* 77 83    Recent Results (from the past 240 hour(s))  Resp Panel by RT-PCR (Flu A&B, Covid) Nasopharyngeal Swab     Status: None   Collection Time: 03/02/21  3:32 PM   Specimen: Nasopharyngeal Swab; Nasopharyngeal(NP) swabs in vial transport medium  Result Value Ref Range  Status   SARS Coronavirus 2 by RT PCR NEGATIVE NEGATIVE Final    Comment: (NOTE) SARS-CoV-2 target nucleic acids are NOT DETECTED.  The SARS-CoV-2 RNA is generally detectable in upper respiratory specimens during the acute phase of infection. The lowest concentration of SARS-CoV-2 viral copies this assay can detect is 138 copies/mL. A negative result does not preclude SARS-Cov-2 infection and should not be used as the sole basis for treatment or other patient management decisions. A negative result may occur with  improper specimen collection/handling, submission of specimen other than nasopharyngeal swab, presence of viral mutation(s) within the areas targeted by this assay, and inadequate number of viral copies(<138 copies/mL). A negative result must be combined with clinical observations, patient history, and epidemiological information. The expected result is Negative.  Fact Sheet for Patients:  EntrepreneurPulse.com.au  Fact Sheet for Healthcare Providers:  IncredibleEmployment.be  This test is no t yet approved or cleared by the [place] FDA and  has been authorized for detection and/or diagnosis  of SARS-CoV-2 by FDA under an Emergency Use Authorization (EUA). This EUA will remain  in effect (meaning this test can be used) for the duration of the COVID-19 declaration under Section 564(b)(1) of the Act, 21 U.S.C.section 360bbb-3(b)(1), unless the authorization is terminated  or revoked sooner.       Influenza A by PCR NEGATIVE NEGATIVE Final   Influenza B by PCR NEGATIVE NEGATIVE Final    Comment: (NOTE) The Xpert Xpress SARS-CoV-2/FLU/RSV plus assay is intended as an aid in the diagnosis of influenza from Nasopharyngeal swab specimens and should not be used as a sole basis for treatment. Nasal washings and aspirates are unacceptable for Xpert Xpress SARS-CoV-2/FLU/RSV testing.  Fact Sheet for  Patients: EntrepreneurPulse.com.au  Fact Sheet for Healthcare Providers: IncredibleEmployment.be  This test is not yet approved or cleared by the [place] FDA and has been authorized for detection and/or diagnosis of SARS-CoV-2 by FDA under an Emergency Use Authorization (EUA). This EUA will remain in effect (meaning this test can be used) for the duration of the COVID-19 declaration under Section 564(b)(1) of the Act, 21 U.S.C. section 360bbb-3(b)(1), unless the authorization is terminated or revoked.  Performed at [facility], [address], [place], [place] [address]      Radiology Studies: [facility] 1 View  Result Date: 03/02/2021 CLINICAL DATA:  Shortness of breath Gain 30 pound in 4 days Diffuse swelling EXAM: PORTABLE CHEST 1 VIEW COMPARISON:  02/02/2021 FINDINGS: The heart size and mediastinal contours are within normal limits. Both lungs are clear. The visualized skeletal structures are unremarkable. IMPRESSION: No active disease. Electronically Signed   By: Miachel Roux M.D.   On: 03/02/2021 14:19    Scheduled Meds: . enoxaparin (LOVENOX) injection  40 mg Subcutaneous Q24H  . furosemide  40 mg Intravenous BID  . insulin aspart  0-15 Units Subcutaneous TID WC  . insulin aspart  0-5 Units Subcutaneous QHS  . insulin aspart  8 Units Subcutaneous TID WC  . insulin glargine  30 Units Subcutaneous Daily  . lisinopril  20 mg Oral Daily  . pantoprazole  40 mg Oral Daily  . potassium chloride  10 mEq Oral Daily  . tamsulosin  0.4 mg Oral Daily   Continuous Infusions:   LOS: 1 day   Time spent: 37 mins   Alexander Aument Wynetta Emery, MD How to contact the [facility] Attending or Consulting provider [place] or covering provider during after hours [place], for this patient?  1. Check the care team in [facility] and look for a) attending/consulting [facility] provider listed and b) the [facility] team listed 2. Log into [email or web address] and use [place]'s universal  password to access. If you do not have the password, please contact the hospital operator. 3. Locate the [facility] provider you are looking for under [facility] and page to a number that you can be directly reached. 4. If you still have difficulty reaching the provider, please page the [facility] (Director on Call) for the Hospitalists listed on amion for assistance.  03/03/2021, 4:42 PM

## 2021-03-04 ENCOUNTER — Inpatient Hospital Stay (HOSPITAL_COMMUNITY): Payer: Self-pay

## 2021-03-04 LAB — CBC
HCT: 26.7 % — ABNORMAL LOW (ref 36.0–46.0)
Hemoglobin: 8.6 g/dL — ABNORMAL LOW (ref 12.0–15.0)
MCH: 29.3 pg (ref 26.0–34.0)
MCHC: 32.2 g/dL (ref 30.0–36.0)
MCV: 90.8 fL (ref 80.0–100.0)
Platelets: 474 10*3/uL — ABNORMAL HIGH (ref 150–400)
RBC: 2.94 MIL/uL — ABNORMAL LOW (ref 3.87–5.11)
RDW: 12.7 % (ref 11.5–15.5)
WBC: 11.2 10*3/uL — ABNORMAL HIGH (ref 4.0–10.5)
nRBC: 0 % (ref 0.0–0.2)

## 2021-03-04 LAB — RENAL FUNCTION PANEL
Albumin: 1.5 g/dL — ABNORMAL LOW (ref 3.5–5.0)
Anion gap: 9 (ref 5–15)
BUN: 17 mg/dL (ref 6–20)
CO2: 27 mmol/L (ref 22–32)
Calcium: 8.3 mg/dL — ABNORMAL LOW (ref 8.9–10.3)
Chloride: 99 mmol/L (ref 98–111)
Creatinine, Ser: 1.04 mg/dL — ABNORMAL HIGH (ref 0.44–1.00)
GFR, Estimated: >60 mL/min (ref >60)
Glucose, Bld: 123 mg/dL — ABNORMAL HIGH (ref 70–99)
Phosphorus: 5.8 mg/dL — ABNORMAL HIGH (ref 2.5–4.6)
Potassium: 3.9 mmol/L (ref 3.5–5.1)
Sodium: 135 mmol/L (ref 135–145)

## 2021-03-04 LAB — GLUCOSE, CAPILLARY
Glucose-Capillary: 135 mg/dL — ABNORMAL HIGH (ref 70–99)
Glucose-Capillary: 91 mg/dL (ref 70–99)
Glucose-Capillary: 113 mg/dL — ABNORMAL HIGH (ref 70–99)
Glucose-Capillary: 155 mg/dL — ABNORMAL HIGH (ref 70–99)
Glucose-Capillary: 74 mg/dL (ref 70–99)
Glucose-Capillary: 80 mg/dL (ref 70–99)

## 2021-03-04 LAB — COMPLEMENT, TOTAL: Compl, Total (CH50): >60 U/mL (ref >41)

## 2021-03-04 MED ORDER — OXYCODONE HCL 5 MG PO TABS
5.0000 mg | ORAL_TABLET | Freq: Four times a day (QID) | ORAL | Status: DC | PRN
  Administered 2021-03-04 – 2021-03-10 (×12): 5 mg via ORAL
  Filled 2021-03-04 (×13): qty 1

## 2021-03-04 MED ORDER — INSULIN ASPART 100 UNIT/ML ~~LOC~~ SOLN: 6.0000 [IU] | Freq: Three times a day (TID) | SUBCUTANEOUS | Status: DC

## 2021-03-04 MED ORDER — POLYETHYLENE GLYCOL 3350 17 G PO PACK
17.0000 g | PACK | Freq: Two times a day (BID) | ORAL | Status: DC
  Administered 2021-03-04 (×2): 17 g via ORAL
  Filled 2021-03-04 (×2): qty 1

## 2021-03-04 MED ORDER — INSULIN ASPART 100 UNIT/ML ~~LOC~~ SOLN
6.0000 [IU] | Freq: Three times a day (TID) | SUBCUTANEOUS | Status: DC
  Administered 2021-03-04 (×2): 6 [IU] via SUBCUTANEOUS

## 2021-03-04 MED ORDER — INSULIN GLARGINE 100 UNIT/ML ~~LOC~~ SOLN
25.0000 [IU] | Freq: Every day | SUBCUTANEOUS | Status: DC
  Administered 2021-03-04: 25 [IU] via SUBCUTANEOUS
  Filled 2021-03-04 (×3): qty 0.25

## 2021-03-04 MED ORDER — TECHNETIUM TO 99M ALBUMIN AGGREGATED
4.0000 | Freq: Once | INTRAVENOUS | Status: AC | PRN
  Administered 2021-03-04: 4.4 via INTRAVENOUS

## 2021-03-04 NOTE — Progress Notes (Signed)
PROGRESS NOTE   Diamond [name]  RAQ:[id number] DOB: 05-09-1996 DOA: 03/02/2021 PCP: [name], PA-C   Chief Complaint  Patient presents with  . Weight Gain   Level of care: Telemetry  Brief Admission History:  25 y.o. female, with past medical history of uncontrolled type 1 diabetes mellitus, with multiple admissions in the past for HHS, and hyperglycemia, anasarca, hypertension, she presents to ED due to significant weight gain recently discharged from [facility] 4 days ago, she reports 30 pounds weight gain since, reports her weight was 115 pounds on discharge, report today's weight 145, reports she has been compliant with low-salt diet, Lasix, she denies any fever, chills, chest pain, patient cardiac echo in 2021 with preserved EF 65 to 70%, with mild left ventricular hypertrophy, her BNP within normal limit, she does report some shortness of breath, intermittent cough, abdominal distention and decreased renal pain and whole body aching. - in ED, creatinine at 0.92, her albumin 1.9, LFTs within normal limit, she is with known anemia hemoglobin of 9.4, BNP normal at 89, chest x-ray with no evidence of volume overload, patient received 80 mg of IV Lasix and [facility] hospitalist consulted to admit.  Assessment & Plan:   Active Problems:   Uncontrolled type 1 diabetes mellitus with hyperglycemia (HCC)   GERD (gastroesophageal reflux disease)   Bilateral lower extremity edema   Anasarca   Essential hypertension, benign   Crohn's disease ([place])  Anasarca - Pt being evaluated for nephrotic syndrome, following 24 hour urine and appreciate nephrology consultation.  Continue IV lasix.   Type 1 DM uncontrolled with possible renal complications - continue Lantus 25 units daily with prandial NovoLog 6 units 3 times daily with meals.  Monitor CBG 5 times per day as needed.  Supplemental sliding scale coverage ordered as well.  Proteinuria-24-hour urine pending, nephrology  increased lisinopril to 40 mg daily.  Anticipate possible need for renal biopsy on 03/06/2021-determination made by nephrology team on Monday.  Poorly controlled hypertension-blood pressures better controlled with increased dose of lisinopril ordered by nephrology.  Normocytic anemia-appreciate nephrology consult and recommendations for IV iron infusion which has been given on 03/03/21.  Leukocytosis-possibly reactive no signs of infection have been found.  Check blood cultures x2.  Follow CBC with differential.  Fever-unknown cause, no source of infection has been found we have ordered blood cultures x2, Tylenol ordered as needed.  Sinus tachycardia-unknown cause, negative venous Dopplers of the lower extremity and V/Q scan low probability of PE.   Chronic leg back and knee pain - Pt requesting IV pain medication.  However, I don't feel that it is appropriate at this time.  If she is having nausea we will treat the nausea.  If she can't tolerate the tramadol, low dose oxycodone is ordered as needed but no IV pain medication. I would not use ketorolac at this time as to try not to harm her kidneys as she may have a renal biopsy in next 1-2 days.   Constipation - Pt requested to start miralax 17 gm BID.  Follow response.    DVT prophylaxis: Enoxaparin Code Status: Full Family Communication: Updated patient at bedside and verbalized understanding Disposition: Home when medically stable Status is: Inpatient  Remains inpatient appropriate because:IV treatments appropriate due to intensity of illness or inability to take PO and Inpatient level of care appropriate due to severity of illness   Dispo: The patient is from: Home  Anticipated d/c is to: Home              Patient currently is not medically stable to d/c.   Difficult to place patient No  Consultants:   nephrology  Procedures:     Antimicrobials:    Subjective: Pt c/o constipation.    Objective: Vitals:    03/03/21 1914 03/03/21 2133 03/04/21 0047 03/04/21 0509  BP: (!) 152/101 (!) 131/94 124/89 118/88  Pulse: (!) 131 (!) 107 (!) 110 (!) 108  Resp: 19 19 19 20   Temp: 98 F (36.7 C) 98 F (36.7 C) 98.5 F (36.9 C) 98.6 F (37 C)  TempSrc:      SpO2: 93% 94% 97% 92%  Weight:      Height:        Intake/Output Summary (Last 24 hours) at 03/04/2021 1430 Last data filed at 03/04/2021 0835 Gross per 24 hour  Intake 796 ml  Output 1300 ml  Net -504 ml   Filed Weights   03/02/21 1151 03/02/21 1906 03/03/21 0504  Weight: 66.5 kg 64 kg 63.8 kg    Examination:  General exam: Appears calm and comfortable  Respiratory system: Clear to auscultation. Respiratory effort normal. Cardiovascular system: normal S1 & S2 heard. No JVD, murmurs, rubs, gallops or clicks. No pedal edema. Gastrointestinal system: Abdomen is nondistended, soft and nontender. No organomegaly or masses felt. Normal bowel sounds heard. Central nervous system: Alert and oriented. No focal neurological deficits. Extremities: Symmetric 5 x 5 power. Skin: No rashes, lesions or ulcers Psychiatry: Judgement and insight appear normal. Mood & affect appropriate.   Data Reviewed: I have personally reviewed following labs and imaging studies  CBC: Recent Labs  Lab 03/02/21 1419 03/03/21 0436 03/04/21 0542  WBC 12.1* 12.6* 11.2*  NEUTROABS 8.6*  --   --   HGB 9.4* 8.2* 8.6*  HCT 29.0* 25.5* 26.7*  MCV 89.5 90.4 90.8  PLT 559* 480* 474*    Basic Metabolic Panel: Recent Labs  Lab 03/02/21 1419 03/03/21 0436 03/04/21 0542  NA 130* 134* 135  K 4.1 3.9 3.9  CL 98 100 99  CO2 24 26 27   GLUCOSE 413* 235* 123*  BUN 18 16 17   CREATININE 0.92 1.00 1.04*  CALCIUM 8.6* 8.1* 8.3*  MG 2.1  --   --   PHOS  --   --  5.8*    GFR: Estimated Creatinine Clearance: 67.7 mL/min (A) (by C-G formula based on SCr of 1.04 mg/dL (H)).  Liver Function Tests: Recent Labs  Lab 03/02/21 1419 03/03/21 0436 03/04/21 0542  AST 11*  13*  --   ALT 11 11  --   ALKPHOS 96 78  --   BILITOT 0.6 0.3  --   PROT 7.2 5.8*  --   ALBUMIN 1.9* 1.4* 1.5*    CBG: Recent Labs  Lab 03/03/21 1959 03/03/21 2138 03/04/21 0318 03/04/21 0742 03/04/21 1126  GLUCAP 176* 167* 135* 91 113*    Recent Results (from the past 240 hour(s))  Resp Panel by RT-PCR (Flu A&B, Covid) Nasopharyngeal Swab     Status: None   Collection Time: 03/02/21  3:32 PM   Specimen: Nasopharyngeal Swab; Nasopharyngeal(NP) swabs in vial transport medium  Result Value Ref Range Status   SARS Coronavirus 2 by RT PCR NEGATIVE NEGATIVE Final    Comment: (NOTE) SARS-CoV-2 target nucleic acids are NOT DETECTED.  The SARS-CoV-2 RNA is generally detectable in upper respiratory specimens during the acute phase of infection. The lowest concentration  of SARS-CoV-2 viral copies this assay can detect is 138 copies/mL. A negative result does not preclude SARS-Cov-2 infection and should not be used as the sole basis for treatment or other patient management decisions. A negative result may occur with  improper specimen collection/handling, submission of specimen other than nasopharyngeal swab, presence of viral mutation(s) within the areas targeted by this assay, and inadequate number of viral copies(<138 copies/mL). A negative result must be combined with clinical observations, patient history, and epidemiological information. The expected result is Negative.  Fact Sheet for Patients:  EntrepreneurPulse.com.au  Fact Sheet for Healthcare Providers:  IncredibleEmployment.be  This test is no t yet approved or cleared by the [place] FDA and  has been authorized for detection and/or diagnosis of SARS-CoV-2 by FDA under an Emergency Use Authorization (EUA). This EUA will remain  in effect (meaning this test can be used) for the duration of the COVID-19 declaration under Section 564(b)(1) of the Act, 21 U.S.C.section  360bbb-3(b)(1), unless the authorization is terminated  or revoked sooner.       Influenza A by PCR NEGATIVE NEGATIVE Final   Influenza B by PCR NEGATIVE NEGATIVE Final    Comment: (NOTE) The Xpert Xpress SARS-CoV-2/FLU/RSV plus assay is intended as an aid in the diagnosis of influenza from Nasopharyngeal swab specimens and should not be used as a sole basis for treatment. Nasal washings and aspirates are unacceptable for Xpert Xpress SARS-CoV-2/FLU/RSV testing.  Fact Sheet for Patients: EntrepreneurPulse.com.au  Fact Sheet for Healthcare Providers: IncredibleEmployment.be  This test is not yet approved or cleared by the [place] FDA and has been authorized for detection and/or diagnosis of SARS-CoV-2 by FDA under an Emergency Use Authorization (EUA). This EUA will remain in effect (meaning this test can be used) for the duration of the COVID-19 declaration under Section 564(b)(1) of the Act, 21 U.S.C. section 360bbb-3(b)(1), unless the authorization is terminated or revoked.  Performed at [facility], [address] [place], [place] [address]   Culture, blood (Routine X 2) w Reflex to ID Panel     Status: None (Preliminary result)   Collection Time: 03/03/21  4:48 PM   Specimen: BLOOD LEFT HAND  Result Value Ref Range Status   Specimen Description BLOOD LEFT HAND  Final   Special Requests   Final    Blood Culture adequate volume BOTTLES DRAWN AEROBIC AND ANAEROBIC   Culture   Final    NO GROWTH < 24 HOURS Performed at [facility], [address] [place], [place] [address]    Report Status PENDING  Incomplete  Culture, blood (Routine X 2) w Reflex to ID Panel     Status: None (Preliminary result)   Collection Time: 03/03/21  4:54 PM   Specimen: Left Antecubital; Blood  Result Value Ref Range Status   Specimen Description LEFT ANTECUBITAL  Final   Special Requests   Final    Blood Culture adequate volume BOTTLES DRAWN AEROBIC  AND ANAEROBIC   Culture   Final    NO GROWTH < 24 HOURS Performed at [facility], [address] [place], [place] [address]    Report Status PENDING  Incomplete     Radiology Studies: DG Chest 2 View  Result Date: 03/04/2021 CLINICAL DATA:  Fever and shortness of breath for several days. EXAM: CHEST - 2 VIEW COMPARISON:  03/02/2021 FINDINGS: Cardiomediastinal silhouette and pulmonary vasculature are within normal limits. Interval development of of bibasilar airspace opacities suspicious for pneumonia. IMPRESSION: Findings suspicious for multifocal pneumonia. Electronically  Signed   By: Miachel Roux M.D.   On: 03/04/2021 10:28   NM Pulmonary Perfusion  Result Date: 03/04/2021 CLINICAL DATA:  Positive D-dimer. Shortness of breath and chest pain several months. Low to intermediate probability for pulmonary embolism. EXAM: NUCLEAR MEDICINE PERFUSION LUNG SCAN TECHNIQUE: Perfusion images were obtained in multiple projections after intravenous injection of radiopharmaceutical. Ventilation scans intentionally deferred if perfusion scan and chest x-ray adequate for interpretation during COVID 19 epidemic. RADIOPHARMACEUTICALS:  4.4 mCi Tc-20mMAA IV COMPARISON:  Two view chest 03/04/2021 FINDINGS: Examination demonstrates normal perfusion images without focal peripheral wedge-shaped perfusion defect. IMPRESSION: Normal perfusion lung scan without evidence of pulmonary emboli. Electronically Signed   By: DMarin OlpM.D.   On: 03/04/2021 11:06   [place]Venous Img Lower Bilateral (DVT)  Result Date: 03/04/2021 CLINICAL DATA:  Bilateral lower extremity edema.  Evaluate for DVT. EXAM: BILATERAL LOWER EXTREMITY VENOUS DOPPLER ULTRASOUND TECHNIQUE: Gray-scale sonography with graded compression, as well as color Doppler and duplex ultrasound were performed to evaluate the lower extremity deep venous systems from the level of the common femoral vein and including the common femoral, femoral, profunda femoral,  popliteal and calf veins including the posterior tibial, peroneal and gastrocnemius veins when visible. The superficial great saphenous vein was also interrogated. Spectral Doppler was utilized to evaluate flow at rest and with distal augmentation maneuvers in the common femoral, femoral and popliteal veins. COMPARISON:  Nuclear medicine V/Q scan-03/04/2021 Bilateral lower extremity venous Doppler ultrasound-01/24/2021 (negative). FINDINGS: RIGHT LOWER EXTREMITY Common Femoral Vein: No evidence of thrombus. Normal compressibility, respiratory phasicity and response to augmentation. Saphenofemoral Junction: No evidence of thrombus. Normal compressibility and flow on color Doppler imaging. Profunda Femoral Vein: No evidence of thrombus. Normal compressibility and flow on color Doppler imaging. Femoral Vein: No evidence of thrombus. Normal compressibility, respiratory phasicity and response to augmentation. Popliteal Vein: No evidence of thrombus. Normal compressibility, respiratory phasicity and response to augmentation. Calf Veins: No evidence of thrombus. Normal compressibility and flow on color Doppler imaging. Superficial Great Saphenous Vein: No evidence of thrombus. Normal compressibility. Venous Reflux:  None. Other Findings: There is a moderate amount of subcutaneous edema at the level the right calf. Redemonstrated prominent right inguinal lymph nodes with index right inguinal lymph node measuring 0.8 cm in greatest short axis diameter (image 2), similar to the 01/24/2021 examination, presumably reactive in etiology. LEFT LOWER EXTREMITY Common Femoral Vein: No evidence of thrombus. Normal compressibility, respiratory phasicity and response to augmentation. Saphenofemoral Junction: No evidence of thrombus. Normal compressibility and flow on color Doppler imaging. Profunda Femoral Vein: No evidence of thrombus. Normal compressibility and flow on color Doppler imaging. Femoral Vein: No evidence of thrombus.  Normal compressibility, respiratory phasicity and response to augmentation. Popliteal Vein: No evidence of thrombus. Normal compressibility, respiratory phasicity and response to augmentation. Calf Veins: No evidence of thrombus. Normal compressibility and flow on color Doppler imaging. Superficial Great Saphenous Vein: No evidence of thrombus. Normal compressibility. Venous Reflux:  None. Other Findings: There is a minimal to moderate amount of subcutaneous edema at the level of the left calf. IMPRESSION: No evidence of DVT within either lower extremity. Electronically Signed   By: JSandi MariscalM.D.   On: 03/04/2021 11:56    Scheduled Meds: . acetaminophen  650 mg Oral Q6H  . enoxaparin (LOVENOX) injection  40 mg Subcutaneous Q24H  . furosemide  40 mg Intravenous BID  . insulin aspart  0-15 Units Subcutaneous TID WC  . insulin aspart  0-5 Units Subcutaneous  QHS  . insulin aspart  6 Units Subcutaneous TID WC  . insulin glargine  25 Units Subcutaneous Daily  . lisinopril  20 mg Oral Daily  . pantoprazole  40 mg Oral Daily  . polyethylene glycol  17 g Oral BID  . potassium chloride  10 mEq Oral Daily  . tamsulosin  0.4 mg Oral Daily   Continuous Infusions:   LOS: 2 days   Time spent: 36 mins   Kaien Pezzullo Wynetta Emery, MD How to contact the [facility] Attending or Consulting provider [place] or covering provider during after hours Will, for this patient?  1. Check the care team in [employer] and look for a) attending/consulting [facility] provider listed and b) the [facility] team listed 2. Log into [email or web address] and use [facility] universal password to access. If you do not have the password, please contact the hospital operator. 3. Locate the [facility] provider you are looking for under [facility] and page to a number that you can be directly reached. 4. If you still have difficulty reaching the provider, please page the [facility] (Director on Call) for the Hospitalists listed on amion for assistance.  03/04/2021, 2:30 PM

## 2021-03-04 NOTE — Plan of Care (Signed)

## 2021-03-05 DIAGNOSIS — J189 Pneumonia, unspecified organism: Secondary | ICD-10-CM | POA: Diagnosis present

## 2021-03-05 LAB — GLUCOSE, CAPILLARY
Glucose-Capillary: 42 mg/dL — CL (ref 70–99)
Glucose-Capillary: 107 mg/dL — ABNORMAL HIGH (ref 70–99)
Glucose-Capillary: 61 mg/dL — ABNORMAL LOW (ref 70–99)
Glucose-Capillary: 126 mg/dL — ABNORMAL HIGH (ref 70–99)
Glucose-Capillary: 130 mg/dL — ABNORMAL HIGH (ref 70–99)
Glucose-Capillary: 91 mg/dL (ref 70–99)
Glucose-Capillary: 94 mg/dL (ref 70–99)

## 2021-03-05 LAB — RENAL FUNCTION PANEL
Albumin: 1.8 g/dL — ABNORMAL LOW (ref 3.5–5.0)
Anion gap: 11 (ref 5–15)
BUN: 15 mg/dL (ref 6–20)
CO2: 27 mmol/L (ref 22–32)
Calcium: 8 mg/dL — ABNORMAL LOW (ref 8.9–10.3)
Chloride: 98 mmol/L (ref 98–111)
Creatinine, Ser: 1.08 mg/dL — ABNORMAL HIGH (ref 0.44–1.00)
GFR, Estimated: >60 mL/min (ref >60)
Glucose, Bld: 117 mg/dL — ABNORMAL HIGH (ref 70–99)
Phosphorus: 5.1 mg/dL — ABNORMAL HIGH (ref 2.5–4.6)
Potassium: 4.4 mmol/L (ref 3.5–5.1)
Sodium: 136 mmol/L (ref 135–145)

## 2021-03-05 LAB — CBC
HCT: 26.2 % — ABNORMAL LOW (ref 36.0–46.0)
Hemoglobin: 8.2 g/dL — ABNORMAL LOW (ref 12.0–15.0)
MCH: 29.1 pg (ref 26.0–34.0)
MCHC: 31.3 g/dL (ref 30.0–36.0)
MCV: 92.9 fL (ref 80.0–100.0)
Platelets: 524 10*3/uL — ABNORMAL HIGH (ref 150–400)
RBC: 2.82 MIL/uL — ABNORMAL LOW (ref 3.87–5.11)
RDW: 12.7 % (ref 11.5–15.5)
WBC: 13.7 10*3/uL — ABNORMAL HIGH (ref 4.0–10.5)
nRBC: 0 % (ref 0.0–0.2)

## 2021-03-05 MED ORDER — IBUPROFEN 600 MG PO TABS
600.0000 mg | ORAL_TABLET | Freq: Once | ORAL | Status: DC
  Filled 2021-03-05: qty 1

## 2021-03-05 MED ORDER — DEXTROSE 50 % IV SOLN
25.0000 mL | Freq: Once | INTRAVENOUS | Status: DC
  Filled 2021-03-05: qty 50

## 2021-03-05 MED ORDER — DEXTROSE 50 % IV SOLN
INTRAVENOUS | Status: AC
  Filled 2021-03-05: qty 50

## 2021-03-05 MED ORDER — SODIUM CHLORIDE 0.9 % IV BOLUS
500.0000 mL | Freq: Once | INTRAVENOUS | Status: AC
  Administered 2021-03-05: 500 mL via INTRAVENOUS

## 2021-03-05 MED ORDER — POLYETHYLENE GLYCOL 3350 17 G PO PACK: 17.0000 g | PACK | Freq: Every day | ORAL | Status: DC | PRN

## 2021-03-05 MED ORDER — INSULIN ASPART 100 UNIT/ML ~~LOC~~ SOLN
4.0000 [IU] | Freq: Three times a day (TID) | SUBCUTANEOUS | Status: DC
  Administered 2021-03-05 (×3): 4 [IU] via SUBCUTANEOUS

## 2021-03-05 MED ORDER — LEVOFLOXACIN IN D5W 750 MG/150ML IV SOLN
750.0000 mg | INTRAVENOUS | Status: DC
  Administered 2021-03-05: 750 mg via INTRAVENOUS
  Filled 2021-03-05 (×2): qty 150

## 2021-03-05 MED ORDER — INSULIN GLARGINE 100 UNIT/ML ~~LOC~~ SOLN
16.0000 [IU] | Freq: Every day | SUBCUTANEOUS | Status: DC
  Administered 2021-03-05: 16 [IU] via SUBCUTANEOUS
  Filled 2021-03-05 (×3): qty 0.16

## 2021-03-05 MED ORDER — INSULIN ASPART 100 UNIT/ML ~~LOC~~ SOLN: 5.0000 [IU] | Freq: Three times a day (TID) | SUBCUTANEOUS | Status: DC

## 2021-03-05 MED ORDER — INSULIN GLARGINE 100 UNIT/ML ~~LOC~~ SOLN: 20.0000 [IU] | Freq: Every day | SUBCUTANEOUS | Status: DC

## 2021-03-05 MED ORDER — INSULIN ASPART 100 UNIT/ML ~~LOC~~ SOLN
0.0000 [IU] | Freq: Three times a day (TID) | SUBCUTANEOUS | Status: DC
  Administered 2021-03-06: 5 [IU] via SUBCUTANEOUS
  Administered 2021-03-06: 2 [IU] via SUBCUTANEOUS
  Administered 2021-03-06: 1 [IU] via SUBCUTANEOUS
  Administered 2021-03-07 (×2): 7 [IU] via SUBCUTANEOUS
  Administered 2021-03-08: 2 [IU] via SUBCUTANEOUS
  Administered 2021-03-08 (×2): 9 [IU] via SUBCUTANEOUS
  Administered 2021-03-09: 5 [IU] via SUBCUTANEOUS
  Administered 2021-03-09: 7 [IU] via SUBCUTANEOUS
  Administered 2021-03-10: 2 [IU] via SUBCUTANEOUS
  Administered 2021-03-10 (×2): 5 [IU] via SUBCUTANEOUS

## 2021-03-05 NOTE — Progress Notes (Signed)
Pharmacy Antibiotic Note  Diamond [name] is a 25 y.o. female admitted on 03/02/2021 with pneumonia.  Pharmacy has been consulted for Levaquin dosing.  Plan: Levaquin 789m IV q24h F/u cxs and clinical progress Monitor V/S, labs.  F/U transition to po  Height: 4' 11"  (149.9 cm) Weight: 63.8 kg (140 lb 10.5 oz) IBW/kg (Calculated) : 43.2  Temp (24hrs), Avg:99.9 F (37.7 C), Min:97.5 F (36.4 C), Max:102.3 F (39.1 C)  Recent Labs  Lab 03/02/21 1419 03/02/21 1421 03/02/21 1659 03/03/21 0436 03/04/21 0542 03/05/21 0431  WBC 12.1*  --   --  12.6* 11.2* 13.7*  CREATININE 0.92  --   --  1.00 1.04*  --   LATICACIDVEN  --  1.1 1.8  --   --   --     Estimated Creatinine Clearance: 67.7 mL/min (A) (by C-G formula based on SCr of 1.04 mg/dL (H)).    Allergies  Allergen Reactions  . Amoxicillin Hives    Did it involve swelling of the face/tongue/throat, SOB, or low BP? no  Did it involve sudden or severe rash/hives, skin peeling, or any reaction on the inside of your mouth or nose? no Did you need to seek medical attention at a hospital or doctor's office? no When did it last happen?unk If all above answers are "NO", may proceed with cephalosporin use.  .Marland KitchenDexamethasone Hives and Itching  . Doxycycline Itching and Swelling  . Penicillins Hives and Itching    Did it involve swelling of the face/tongue/throat, SOB, or low BP? no  Did it involve sudden or severe rash/hives, skin peeling, or any reaction on the inside of your mouth or nose? no Did you need to seek medical attention at a hospital or doctor's office? no When did it last happen?unk If all above answers are "NO", may proceed with cephalosporin use. Did it involve swelling of the face/tongue/throat, SOB, or low BP? no  Did it involve sudden or severe rash/hives, skin peeling, or any reaction on the inside of your mouth or nose? no Did you need to seek medical attention at a hospital or doctor's office?  no When did it last happen?unk If all above answers are "NO", may proceed with cephalosporin use. Tolerated keflex May 2021 with no issue.   . Augmentin [Amoxicillin-Pot Clavulanate] Other (See Comments)    Reaction unknown  . Ceftriaxone Sodium In Dextrose   . Clavulanic Acid   . Adhesive [Tape] Itching and Rash  . Latex Itching and Rash  . Other Itching and Rash  . Pineapple Rash    Rash on tongue and throat    Antimicrobials this admission: Levaquin 4/3 >>   Microbiology results: 4/3 BCx: pending 4/3 BCx: ngtd  Thank you for allowing pharmacy to be a part of this patient's care.  LIsac Sarna BS Pharm D, BCaliforniaClinical Pharmacist Pager #[phone]/01/2021 9:14 AM

## 2021-03-05 NOTE — Progress Notes (Addendum)
PROGRESS NOTE   Diamond [name]  ZGY:[id number] DOB: 1996/09/21 DOA: 03/02/2021 PCP: [name], PA-C   Chief Complaint  Patient presents with  . Weight Gain   Level of care: Telemetry  Brief Admission History:  25 y.o. female, with past medical history of uncontrolled type 1 diabetes mellitus, with multiple admissions in the past for HHS, and hyperglycemia, anasarca, hypertension, she presents to ED due to significant weight gain recently discharged from [place] 4 days ago, she reports 30 pounds weight gain since, reports her weight was 115 pounds on discharge, report today's weight 145, reports she has been compliant with low-salt diet, Lasix, she denies any fever, chills, chest pain, patient cardiac echo in 2021 with preserved EF 65 to 70%, with mild left ventricular hypertrophy, her BNP within normal limit, she does report some shortness of breath, intermittent cough, abdominal distention and decreased renal pain and whole body aching. - in ED, creatinine at 0.92, her albumin 1.9, LFTs within normal limit, she is with known anemia hemoglobin of 9.4, BNP normal at 89, chest x-ray with no evidence of volume overload, patient received 80 mg of IV Lasix and [facility] hospitalist consulted to admit.  Assessment & Plan:   Principal Problem:   Multifocal pneumonia Active Problems:   Uncontrolled type 1 diabetes mellitus with hyperglycemia (HCC)   GERD (gastroesophageal reflux disease)   Bilateral lower extremity edema   Anasarca   Essential hypertension, benign   Crohn's disease ([place])   Multifocal pneumonia - Pt developed fever, cough and sinus tach. CXR confirms pneumonia.  Reports multiple allergies. Started on levofloxacin 750 mg IV daily.  Follow up blood cultures x 2.  V/Q scan was low probability for PE.     Anasarca - Improving.  Pt being evaluated for nephrotic syndrome, following 24 hour urine and appreciate nephrology consultation.  Continue IV lasix.   Type  1 DM uncontrolled with possible renal complications - continue Lantus 25 units daily with prandial NovoLog 6 units 3 times daily with meals.  Monitor CBG 5 times per day as needed.  Supplemental sliding scale coverage ordered as well.  Proteinuria-24-hour urine pending, nephrology increased lisinopril to 40 mg daily.  Anticipate possible need for renal biopsy on 03/06/2021-determination made by nephrology team on Monday.  Poorly controlled hypertension-blood pressures better controlled with increased dose of lisinopril ordered by nephrology.  Normocytic anemia-appreciate nephrology consult and recommendations for IV iron infusion which has been given on 03/03/21.  Leukocytosis-secondary to pneumonia.  Check blood cultures x2.  Follow CBC with differential.  Fever-unknown cause, no source of infection has been found we have ordered blood cultures x2, Tylenol ordered as needed.  Sinus tachycardia-unknown cause, negative venous Dopplers of the lower extremity and V/Q scan low probability of PE.   Chronic leg back and knee pain - Pt requesting IV pain medication.  However, I don't feel that it is appropriate at this time.  If she is having nausea we will treat the nausea.  If she can't tolerate the tramadol, low dose oxycodone is ordered as needed but no IV pain medication. I would not use ketorolac at this time as to try not to harm her kidneys as she may have a renal biopsy in next 1-2 days.   Constipation - Continue miralax 17 gm BID.  Follow response, she had 1 stool overnight.    DVT prophylaxis: Enoxaparin Code Status: Full Family Communication: Updated patient at bedside and verbalized understanding, spoke with aunt 03/05/21 at patient request.  Disposition: Home when medically stable Status is: Inpatient  Remains inpatient appropriate because:IV treatments appropriate due to intensity of illness or inability to take PO and Inpatient level of care appropriate due to severity of illness  Dispo:  The patient is from: Home              Anticipated d/c is to: Home              Patient currently is not medically stable to d/c.   Difficult to place patient No  Consultants:   nephrology  Procedures:     Antimicrobials:    Subjective: Pt c/o pain in back and legs.    Objective: Vitals:   03/05/21 0203 03/05/21 0321 03/05/21 0345 03/05/21 0542  BP: 113/86 115/81 117/69 125/90  Pulse: (!) 122 (!) 118 (!) 115 (!) 110  Resp: 16 14 20 14   Temp: (!) 100.7 F (38.2 C) 98.6 F (37 C) (!) 97.5 F (36.4 C) 99.8 F (37.7 C)  TempSrc: Oral Oral  Oral  SpO2: 99% 92% 95% 95%  Weight:      Height:        Intake/Output Summary (Last 24 hours) at 03/05/2021 1150 Last data filed at 03/05/2021 0400 Gross per 24 hour  Intake 746.34 ml  Output 1300 ml  Net -553.66 ml   Filed Weights   03/02/21 1151 03/02/21 1906 03/03/21 0504  Weight: 66.5 kg 64 kg 63.8 kg    Examination:  General exam: Appears calm and comfortable  Respiratory system: Clear to auscultation. Respiratory effort normal. Cardiovascular system: normal S1 & S2 heard. No JVD, murmurs, rubs, gallops or clicks. No pedal edema. Gastrointestinal system: Abdomen is nondistended, soft and nontender. No organomegaly or masses felt. Normal bowel sounds heard. Central nervous system: Alert and oriented. No focal neurological deficits. Extremities: Symmetric 5 x 5 power. Skin: No rashes, lesions or ulcers Psychiatry: Judgement and insight appear normal. Mood & affect appropriate.   Data Reviewed: I have personally reviewed following labs and imaging studies  CBC: Recent Labs  Lab 03/02/21 1419 03/03/21 0436 03/04/21 0542 03/05/21 0431  WBC 12.1* 12.6* 11.2* 13.7*  NEUTROABS 8.6*  --   --   --   HGB 9.4* 8.2* 8.6* 8.2*  HCT 29.0* 25.5* 26.7* 26.2*  MCV 89.5 90.4 90.8 92.9  PLT 559* 480* 474* 524*    Basic Metabolic Panel: Recent Labs  Lab 03/02/21 1419 03/03/21 0436 03/04/21 0542 03/05/21 0431  NA 130* 134*  135 136  K 4.1 3.9 3.9 4.4  CL 98 100 99 98  CO2 24 26 27 27   GLUCOSE 413* 235* 123* 117*  BUN 18 16 17 15   CREATININE 0.92 1.00 1.04* 1.08*  CALCIUM 8.6* 8.1* 8.3* 8.0*  MG 2.1  --   --   --   PHOS  --   --  5.8* 5.1*    GFR: Estimated Creatinine Clearance: 65.2 mL/min (A) (by C-G formula based on SCr of 1.08 mg/dL (H)).  Liver Function Tests: Recent Labs  Lab 03/02/21 1419 03/03/21 0436 03/04/21 0542 03/05/21 0431  AST 11* 13*  --   --   ALT 11 11  --   --   ALKPHOS 96 78  --   --   BILITOT 0.6 0.3  --   --   PROT 7.2 5.8*  --   --   ALBUMIN 1.9* 1.4* 1.5* 1.8*    CBG: Recent Labs  Lab 03/05/21 0321 03/05/21 0345 03/05/21 0413 03/05/21 0732 03/05/21  1121  GLUCAP 42* 61* 107* 126* 130*    Recent Results (from the past 240 hour(s))  Resp Panel by RT-PCR (Flu A&B, Covid) Nasopharyngeal Swab     Status: None   Collection Time: 03/02/21  3:32 PM   Specimen: Nasopharyngeal Swab; Nasopharyngeal(NP) swabs in vial transport medium  Result Value Ref Range Status   SARS Coronavirus 2 by RT PCR NEGATIVE NEGATIVE Final    Comment: (NOTE) SARS-CoV-2 target nucleic acids are NOT DETECTED.  The SARS-CoV-2 RNA is generally detectable in upper respiratory specimens during the acute phase of infection. The lowest concentration of SARS-CoV-2 viral copies this assay can detect is 138 copies/mL. A negative result does not preclude SARS-Cov-2 infection and should not be used as the sole basis for treatment or other patient management decisions. A negative result may occur with  improper specimen collection/handling, submission of specimen other than nasopharyngeal swab, presence of viral mutation(s) within the areas targeted by this assay, and inadequate number of viral copies(<138 copies/mL). A negative result must be combined with clinical observations, patient history, and epidemiological information. The expected result is Negative.  Fact Sheet for Patients:   EntrepreneurPulse.com.au  Fact Sheet for Healthcare Providers:  IncredibleEmployment.be  This test is no t yet approved or cleared by the [place] FDA and  has been authorized for detection and/or diagnosis of SARS-CoV-2 by FDA under an Emergency Use Authorization (EUA). This EUA will remain  in effect (meaning this test can be used) for the duration of the COVID-19 declaration under Section 564(b)(1) of the Act, 21 U.S.C.section 360bbb-3(b)(1), unless the authorization is terminated  or revoked sooner.       Influenza A by PCR NEGATIVE NEGATIVE Final   Influenza B by PCR NEGATIVE NEGATIVE Final    Comment: (NOTE) The Xpert Xpress SARS-CoV-2/FLU/RSV plus assay is intended as an aid in the diagnosis of influenza from Nasopharyngeal swab specimens and should not be used as a sole basis for treatment. Nasal washings and aspirates are unacceptable for Xpert Xpress SARS-CoV-2/FLU/RSV testing.  Fact Sheet for Patients: EntrepreneurPulse.com.au  Fact Sheet for Healthcare Providers: IncredibleEmployment.be  This test is not yet approved or cleared by the [place] FDA and has been authorized for detection and/or diagnosis of SARS-CoV-2 by FDA under an Emergency Use Authorization (EUA). This EUA will remain in effect (meaning this test can be used) for the duration of the COVID-19 declaration under Section 564(b)(1) of the Act, 21 U.S.C. section 360bbb-3(b)(1), unless the authorization is terminated or revoked.  Performed at [facility], [address] [place], [place] [address]   Culture, blood (Routine X 2) w Reflex to ID Panel     Status: None (Preliminary result)   Collection Time: 03/03/21  4:48 PM   Specimen: BLOOD LEFT HAND  Result Value Ref Range Status   Specimen Description BLOOD LEFT HAND  Final   Special Requests   Final    Blood Culture adequate volume BOTTLES DRAWN AEROBIC AND  ANAEROBIC   Culture   Final    NO GROWTH < 24 HOURS Performed at [facility], [address], [place], [place] [address]    Report Status PENDING  Incomplete  Culture, blood (Routine X 2) w Reflex to ID Panel     Status: None (Preliminary result)   Collection Time: 03/03/21  4:54 PM   Specimen: Left Antecubital; Blood  Result Value Ref Range Status   Specimen Description LEFT ANTECUBITAL  Final   Special Requests   Final  Blood Culture adequate volume BOTTLES DRAWN AEROBIC AND ANAEROBIC   Culture   Final    NO GROWTH < 24 HOURS Performed at [facility], [address], [place], [place] [address]    Report Status PENDING  Incomplete  Culture, blood (routine x 2)     Status: None (Preliminary result)   Collection Time: 03/05/21  4:31 AM   Specimen: Left Antecubital; Blood  Result Value Ref Range Status   Specimen Description   Final    LEFT ANTECUBITAL BOTTLES DRAWN AEROBIC AND ANAEROBIC   Special Requests   Final    Blood Culture adequate volume Performed at [facility], [address], [place], [place] [address]    Culture PENDING  Incomplete   Report Status PENDING  Incomplete  Culture, blood (routine x 2)     Status: None (Preliminary result)   Collection Time: 03/05/21  4:31 AM   Specimen: BLOOD LEFT HAND  Result Value Ref Range Status   Specimen Description   Final    BLOOD LEFT HAND BOTTLES DRAWN AEROBIC AND ANAEROBIC   Special Requests   Final    Blood Culture adequate volume Performed at [facility], [address], [place], [place] [address]    Culture PENDING  Incomplete   Report Status PENDING  Incomplete     Radiology Studies: DG Chest 2 View  Result Date: 03/04/2021 CLINICAL DATA:  Fever and shortness of breath for several days. EXAM: CHEST - 2 VIEW COMPARISON:  03/02/2021 FINDINGS: Cardiomediastinal silhouette and pulmonary vasculature are within normal limits. Interval development of of bibasilar airspace opacities suspicious for pneumonia. IMPRESSION:  Findings suspicious for multifocal pneumonia. Electronically Signed   By: Miachel Roux M.D.   On: 03/04/2021 10:28   NM Pulmonary Perfusion  Result Date: 03/04/2021 CLINICAL DATA:  Positive D-dimer. Shortness of breath and chest pain several months. Low to intermediate probability for pulmonary embolism. EXAM: NUCLEAR MEDICINE PERFUSION LUNG SCAN TECHNIQUE: Perfusion images were obtained in multiple projections after intravenous injection of radiopharmaceutical. Ventilation scans intentionally deferred if perfusion scan and chest x-ray adequate for interpretation during COVID 19 epidemic. RADIOPHARMACEUTICALS:  4.4 mCi Tc-93mMAA IV COMPARISON:  Two view chest 03/04/2021 FINDINGS: Examination demonstrates normal perfusion images without focal peripheral wedge-shaped perfusion defect. IMPRESSION: Normal perfusion lung scan without evidence of pulmonary emboli. Electronically Signed   By: DMarin OlpM.D.   On: 03/04/2021 11:06   UK[place]Venous Img Lower Bilateral (DVT)  Result Date: 03/04/2021 CLINICAL DATA:  Bilateral lower extremity edema.  Evaluate for DVT. EXAM: BILATERAL LOWER EXTREMITY VENOUS DOPPLER ULTRASOUND TECHNIQUE: Gray-scale sonography with graded compression, as well as color Doppler and duplex ultrasound were performed to evaluate the lower extremity deep venous systems from the level of the common femoral vein and including the common femoral, femoral, profunda femoral, popliteal and calf veins including the posterior tibial, peroneal and gastrocnemius veins when visible. The superficial great saphenous vein was also interrogated. Spectral Doppler was utilized to evaluate flow at rest and with distal augmentation maneuvers in the common femoral, femoral and popliteal veins. COMPARISON:  Nuclear medicine V/Q scan-03/04/2021 Bilateral lower extremity venous Doppler ultrasound-01/24/2021 (negative). FINDINGS: RIGHT LOWER EXTREMITY Common Femoral Vein: No evidence of thrombus. Normal compressibility,  respiratory phasicity and response to augmentation. Saphenofemoral Junction: No evidence of thrombus. Normal compressibility and flow on color Doppler imaging. Profunda Femoral Vein: No evidence of thrombus. Normal compressibility and flow on color Doppler imaging. Femoral Vein: No evidence of thrombus. Normal compressibility, respiratory phasicity and response to  augmentation. Popliteal Vein: No evidence of thrombus. Normal compressibility, respiratory phasicity and response to augmentation. Calf Veins: No evidence of thrombus. Normal compressibility and flow on color Doppler imaging. Superficial Great Saphenous Vein: No evidence of thrombus. Normal compressibility. Venous Reflux:  None. Other Findings: There is a moderate amount of subcutaneous edema at the level the right calf. Redemonstrated prominent right inguinal lymph nodes with index right inguinal lymph node measuring 0.8 cm in greatest short axis diameter (image 2), similar to the 01/24/2021 examination, presumably reactive in etiology. LEFT LOWER EXTREMITY Common Femoral Vein: No evidence of thrombus. Normal compressibility, respiratory phasicity and response to augmentation. Saphenofemoral Junction: No evidence of thrombus. Normal compressibility and flow on color Doppler imaging. Profunda Femoral Vein: No evidence of thrombus. Normal compressibility and flow on color Doppler imaging. Femoral Vein: No evidence of thrombus. Normal compressibility, respiratory phasicity and response to augmentation. Popliteal Vein: No evidence of thrombus. Normal compressibility, respiratory phasicity and response to augmentation. Calf Veins: No evidence of thrombus. Normal compressibility and flow on color Doppler imaging. Superficial Great Saphenous Vein: No evidence of thrombus. Normal compressibility. Venous Reflux:  None. Other Findings: There is a minimal to moderate amount of subcutaneous edema at the level of the left calf. IMPRESSION: No evidence of DVT within  either lower extremity. Electronically Signed   By: Sandi Mariscal M.D.   On: 03/04/2021 11:56    Scheduled Meds: . acetaminophen  650 mg Oral Q6H  . dextrose  25 mL Intravenous Once  . enoxaparin (LOVENOX) injection  40 mg Subcutaneous Q24H  . furosemide  40 mg Intravenous BID  . ibuprofen  600 mg Oral Once  . insulin aspart  0-15 Units Subcutaneous TID WC  . insulin aspart  0-5 Units Subcutaneous QHS  . insulin aspart  4 Units Subcutaneous TID WC  . insulin glargine  16 Units Subcutaneous Daily  . lisinopril  20 mg Oral Daily  . pantoprazole  40 mg Oral Daily  . polyethylene glycol  17 g Oral BID  . potassium chloride  10 mEq Oral Daily  . tamsulosin  0.4 mg Oral Daily   Continuous Infusions: . levofloxacin (LEVAQUIN) IV 750 mg (03/05/21 0926)     LOS: 3 days   Time spent: 36 mins   Jahron Hunsinger Wynetta Emery, MD How to contact the [facility] Attending or Consulting provider [place] or covering provider during after hours [place], for this patient?  1. Check the care team in [facility] and look for a) attending/consulting [facility] provider listed and b) the [facility] team listed 2. Log into [email or web address] and use [place]'s universal password to access. If you do not have the password, please contact the hospital operator. 3. Locate the [facility] provider you are looking for under [facility] and page to a number that you can be directly reached. 4. If you still have difficulty reaching the provider, please page the [facility] (Director on Call) for the Hospitalists listed on amion for assistance.  03/05/2021, 11:50 AM

## 2021-03-05 NOTE — Progress Notes (Signed)
Date and time results received: 03/05/21 2300   Test: blood culture Critical Value: [place]  IN CLUSTERS   Name of Provider Notified: Asia Zierle-Ghosh  Orders Received? Or Actions Taken?: MD stated pt was on correct ABX. No change or new orders at this time.

## 2021-03-05 NOTE — Plan of Care (Signed)

## 2021-03-05 NOTE — Progress Notes (Signed)
At start of shift, this RN found pt in room with visitors that brought in food for pt from [place]. Pt was awake, talking and eating steak, baked potatoe and french fries. Once pt's visitors left, pt then c/o n/v. Pt went back to her barley speaking and acting like she couldn't hold her head up. Pt Then covered herself with 4 blankets, including her head.  When pt's MEWS became yellow/red later in shift, Charge RN was notified, as well as, MD on call.  After pt got bolus of NS, her BGL dropped. Pt then drank 1 234m of juice and BGL was brought up to 107.  By end of shift, pt's HR was still tachy, yet fever and BP were back in normal range.

## 2021-03-06 ENCOUNTER — Inpatient Hospital Stay (HOSPITAL_COMMUNITY): Payer: Self-pay

## 2021-03-06 ENCOUNTER — Ambulatory Visit (HOSPITAL_COMMUNITY): Payer: Self-pay

## 2021-03-06 DIAGNOSIS — R7881 Bacteremia: Secondary | ICD-10-CM

## 2021-03-06 LAB — BLOOD CULTURE ID PANEL (REFLEXED) - BCID2

## 2021-03-06 LAB — ECHOCARDIOGRAM COMPLETE
AR max vel: 1.99 cm2
AV Area VTI: 2.07 cm2
AV Area mean vel: 2.07 cm2
AV Mean grad: 4.8 mmHg
AV Peak grad: 8.7 mmHg
Ao pk vel: 1.47 m/s
Area-P 1/2: 3.97 cm2
Height: 59 in
S' Lateral: 1.76 cm
Weight: 2250.46 oz

## 2021-03-06 LAB — GLUCOSE, CAPILLARY
Glucose-Capillary: 143 mg/dL — ABNORMAL HIGH (ref 70–99)
Glucose-Capillary: 36 mg/dL — CL (ref 70–99)
Glucose-Capillary: 149 mg/dL — ABNORMAL HIGH (ref 70–99)
Glucose-Capillary: 186 mg/dL — ABNORMAL HIGH (ref 70–99)
Glucose-Capillary: 276 mg/dL — ABNORMAL HIGH (ref 70–99)
Glucose-Capillary: 270 mg/dL — ABNORMAL HIGH (ref 70–99)

## 2021-03-06 LAB — MPO/PR-3 (ANCA) ANTIBODIES
ANCA Proteinase 3: <3.5 U/mL (ref 0.0–3.5)
Myeloperoxidase Abs: <9 U/mL (ref 0.0–9.0)

## 2021-03-06 LAB — PROTIME-INR
INR: 1.1 (ref 0.8–1.2)
Prothrombin Time: 14.1 seconds (ref 11.4–15.2)

## 2021-03-06 LAB — RENAL FUNCTION PANEL
Albumin: 1.5 g/dL — ABNORMAL LOW (ref 3.5–5.0)
Anion gap: 9 (ref 5–15)
BUN: 13 mg/dL (ref 6–20)
CO2: 28 mmol/L (ref 22–32)
Calcium: 8 mg/dL — ABNORMAL LOW (ref 8.9–10.3)
Chloride: 97 mmol/L — ABNORMAL LOW (ref 98–111)
Creatinine, Ser: 1.08 mg/dL — ABNORMAL HIGH (ref 0.44–1.00)
GFR, Estimated: >60 mL/min (ref >60)
Glucose, Bld: 184 mg/dL — ABNORMAL HIGH (ref 70–99)
Phosphorus: 4.9 mg/dL — ABNORMAL HIGH (ref 2.5–4.6)
Potassium: 4.2 mmol/L (ref 3.5–5.1)
Sodium: 134 mmol/L — ABNORMAL LOW (ref 135–145)

## 2021-03-06 LAB — CBC
HCT: 26.2 % — ABNORMAL LOW (ref 36.0–46.0)
Hemoglobin: 8 g/dL — ABNORMAL LOW (ref 12.0–15.0)
MCH: 28.3 pg (ref 26.0–34.0)
MCHC: 30.5 g/dL (ref 30.0–36.0)
MCV: 92.6 fL (ref 80.0–100.0)
Platelets: 495 10*3/uL — ABNORMAL HIGH (ref 150–400)
RBC: 2.83 MIL/uL — ABNORMAL LOW (ref 3.87–5.11)
RDW: 12.9 % (ref 11.5–15.5)
WBC: 12.8 10*3/uL — ABNORMAL HIGH (ref 4.0–10.5)
nRBC: 0 % (ref 0.0–0.2)

## 2021-03-06 LAB — IMMUNOFIXATION ELECTROPHORESIS
IgA: 256 mg/dL (ref 87–352)
IgG (Immunoglobin G), Serum: 1626 mg/dL — ABNORMAL HIGH (ref 586–1602)
IgM (Immunoglobulin M), Srm: 106 mg/dL (ref 26–217)
Total Protein ELP: 6 g/dL (ref 6.0–8.5)

## 2021-03-06 LAB — CK: Total CK: 52 U/L (ref 38–234)

## 2021-03-06 MED ORDER — INSULIN ASPART 100 UNIT/ML ~~LOC~~ SOLN
3.0000 [IU] | Freq: Three times a day (TID) | SUBCUTANEOUS | Status: DC
  Administered 2021-03-06 (×3): 3 [IU] via SUBCUTANEOUS

## 2021-03-06 MED ORDER — GADOBUTROL 1 MMOL/ML IV SOLN
7.0000 mL | Freq: Once | INTRAVENOUS | Status: AC | PRN
  Administered 2021-03-06: 7 mL via INTRAVENOUS

## 2021-03-06 MED ORDER — INSULIN GLARGINE 100 UNIT/ML ~~LOC~~ SOLN
12.0000 [IU] | Freq: Every day | SUBCUTANEOUS | Status: DC
  Administered 2021-03-06: 12 [IU] via SUBCUTANEOUS
  Filled 2021-03-06 (×3): qty 0.12

## 2021-03-06 MED ORDER — SODIUM CHLORIDE 0.9 % IV SOLN
8.0000 mg/kg | Freq: Every day | INTRAVENOUS | Status: DC
  Administered 2021-03-06: 500 mg via INTRAVENOUS
  Filled 2021-03-06 (×3): qty 10

## 2021-03-06 MED ORDER — DEXTROSE 50 % IV SOLN
25.0000 mL | Freq: Once | INTRAVENOUS | Status: AC
  Administered 2021-03-06: 25 mL via INTRAVENOUS

## 2021-03-06 NOTE — Progress Notes (Addendum)
      INFECTIOUS DISEASE ATTENDING ADDENDUM:   Date: 03/06/2021  Patient name: Diamond [name]  Medical record number: [id number]  Date of birth: 1996/05/02        [facility] Health Antimicrobial Management Team Staphylococcus aureus bacteremia   Staphylococcus aureus bacteremia (SAB) is associated with a high rate of complications and mortality.  Specific aspects of clinical management are critical to optimizing the outcome of patients with SAB.  Therefore, the [facility] Health Antimicrobial Management Team [facility]) has initiated an intervention aimed at improving the management of SAB at [facility].  To do so, Infectious Diseases physicians are providing an evidence-based consult for the management of all patients with SAB.     Yes No Comments  Perform follow-up blood cultures (even if the patient is afebrile) to ensure clearance of bacteremia [x]  []    Remove vascular catheter and obtain follow-up blood cultures after the removal of the catheter [x]  []  DO NOT PLACE PICC  Perform echocardiography to evaluate for endocarditis (transthoracic ECHO is 40-50% sensitive, TEE is > 90% sensitive) [x]  []  Please keep in mind, that neither test can definitively EXCLUDE endocarditis, and that should clinical suspicion remain high for endocarditis the patient should then still be treated with an "endocarditis" duration of therapy = 6 weeks  Consult electrophysiologist to evaluate implanted cardiac device (pacemaker, ICD) []  []    Ensure source control [x]  []  Have all abscesses been drained effectively? Have deep seeded infections (septic joints or osteomyelitis) had appropriate surgical debridement? ;  Investigate for "metastatic" sites of infection []  []  Does the patient have ANY symptom or physical exam finding that would suggest a deeper infection (back or neck pain that may be suggestive of vertebral osteomyelitis or epidural abscess, muscle pain that could be a symptom of pyomyositis)?  Keep in mind that for  deep seeded infections MRI imaging with contrast is preferred rather than other often insensitive tests such as plain x-rays, especially early in a patient's presentation.   Sounds like needs MRI of back L spine and evaluation for septic knee   Change antibiotic therapy to cefazolin or cubicin if she is truly cephalosporin allergic []  []  Beta-lactam antibiotics are preferred for MSSA due to higher cure rates.   If on Vancomycin, goal trough should be 15 - 20 mcg/mL  Estimated duration of IV antibiotic therapy:  4-6 weeks []  []  Consult case management for probably prolonged outpatient IV antibiotic therapy   I have sent secure chat message to Dr. Wynetta Emery and I am available to discuss case.   [name] 03/06/2021, 8:37 AM

## 2021-03-06 NOTE — Progress Notes (Signed)
PROGRESS NOTE   DEREONNA [name]  UKG:[id number] DOB: 12-03-1996 DOA: 03/02/2021 PCP: Diamond Dryer, PA-C   Chief Complaint  Patient presents with  . Weight Gain   Level of care: Telemetry  Brief Admission History:  25 y.o. female, with past medical history of uncontrolled type 1 diabetes mellitus, with multiple admissions in the past for HHS, and hyperglycemia, anasarca, hypertension, she presents to ED due to significant weight gain recently discharged from [place] 4 days ago, she reports 30 pounds weight gain since, reports her weight was 115 pounds on discharge, report today's weight 145, reports she has been compliant with low-salt diet, Lasix, she denies any fever, chills, chest pain, patient cardiac echo in 2021 with preserved EF 65 to 70%, with mild left ventricular hypertrophy, her BNP within normal limit, she does report some shortness of breath, intermittent cough, abdominal distention and decreased renal pain and whole body aching. - in ED, creatinine at 0.92, her albumin 1.9, LFTs within normal limit, she is with known anemia hemoglobin of 9.4, BNP normal at 89, chest x-ray with no evidence of volume overload, patient received 80 mg of IV Lasix and [facility] hospital[facility] consulted to admit.  Assessment & Plan:   Principal Problem:   Multifocal pneumonia Active Problems:   Uncontrolled type 1 diabetes mellitus with hyperglycemia (HCC)   GERD (gastroesophageal reflux disease)   Bilateral lower extremity edema   Anasarca   Essential hypertension, benign   Crohn's disease ([place])   Multifocal pneumonia - Pt developed fever, cough and sinus tach. CXR confirms pneumonia.  Reports multiple allergies. Started on levofloxacin 750 mg IV daily and now off levofloxacin and on daptomycin to treat staph aureus bacteremia as well.  Follow up blood cultures x 2.  V/Q scan was low probability for PE.     Staph aureus bacteremia - Pt denies any history of IV drug use or  recreational drugs.  Continue daptomycin per ID team, repeat blood cultures, follow up TTE, Obtain MRI L spine to check for infection.   Anasarca - Improving.  Pt being evaluated for nephrotic syndrome, following 24 hour urine and appreciate nephrology consultation.  Continue IV lasix.   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  Added HS snack. Reducing lantus to 12 units daily with prandial NovoLog 4 units 3 times daily with meals.  Monitor CBG 5 times per day as needed.  Supplemental sliding scale coverage ordered as well.  Proteinuria-24-hour urine per renal team, continue lisinopril daily.  Anticipate possible need for renal biopsy on 03/06/2021-determination made by nephrology team on Monday.  Poorly controlled hypertension-blood pressures better controlled with increased dose of lisinopril ordered by nephrology.  Normocytic anemia-appreciate nephrology consult and recommendations for IV iron infusion which has been given on 03/03/21.  Leukocytosis-secondary to pneumonia.  Check blood cultures x2.  Follow CBC with differential.  Fever-Tylenol ordered as needed.  Sinus tachycardia-likely from fever, negative venous Dopplers of the lower extremity and V/Q scan low probability of PE.   Chronic leg back and knee pain - MRI of L spine with contrast ordered for further evaluation.   Constipation - Continue miralax as needed.     DVT prophylaxis: Enoxaparin Code Status: Full Family Communication: Updated patient at bedside and verbalized understanding, spoke with aunt 03/05/21 at patient request.  Disposition: Home when medically stable Status is: Inpatient  Remains inpatient appropriate because:IV treatments appropriate due to intensity of illness or inability to take PO and Inpatient level  of care appropriate due to severity of illness  Dispo: The patient is from: Home              Anticipated d/c is to: Home              Patient  currently is not medically stable to d/c.   Difficult to place patient No  Consultants:   nephrology  Procedures:     Antimicrobials:    Subjective: Pt c/o pain in back and requesting more pain medication.     Objective: Vitals:   03/05/21 1400 03/06/21 0512 03/06/21 1404 03/06/21 1727  BP:  (!) 134/96 132/80   Pulse: (!) 108 96 91   Resp:  18 18   Temp:  (!) 97.5 F (36.4 C) 97.6 F (36.4 C)   TempSrc:  Oral Oral   SpO2:  100% 100%   Weight:    60.3 kg  Height:        Intake/Output Summary (Last 24 hours) at 03/06/2021 1810 Last data filed at 03/06/2021 1752 Gross per 24 hour  Intake 1060 ml  Output 2600 ml  Net -1540 ml   Filed Weights   03/02/21 1906 03/03/21 0504 03/06/21 1727  Weight: 64 kg 63.8 kg 60.3 kg    Examination:  General exam: Appears calm and comfortable  Respiratory system: Clear to auscultation. Respiratory effort normal. Cardiovascular system: normal S1 & S2 heard. No JVD, murmurs, rubs, gallops or clicks. No pedal edema. Gastrointestinal system: Abdomen is nondistended, soft and nontender. No organomegaly or masses felt. Normal bowel sounds heard. Central nervous system: Alert and oriented. No focal neurological deficits. Extremities: no s/s of knee swelling or pain with exam, full ROM, no deformities of knees or back. Symmetric 5 x 5 power. Skin: No rashes, lesions or ulcers Psychiatry: Judgement and insight appear poor. Mood & affect flat.   Data Reviewed: I have personally reviewed following labs and imaging studies  CBC: Recent Labs  Lab 03/02/21 1419 03/03/21 0436 03/04/21 0542 03/05/21 0431 03/06/21 0625  WBC 12.1* 12.6* 11.2* 13.7* 12.8*  NEUTROABS 8.6*  --   --   --   --   HGB 9.4* 8.2* 8.6* 8.2* 8.0*  HCT 29.0* 25.5* 26.7* 26.2* 26.2*  MCV 89.5 90.4 90.8 92.9 92.6  PLT 559* 480* 474* 524* 495*    Basic Metabolic Panel: Recent Labs  Lab 03/02/21 1419 03/03/21 0436 03/04/21 0542 03/05/21 0431 03/06/21 0625  NA 130*  134* 135 136 134*  K 4.1 3.9 3.9 4.4 4.2  CL 98 100 99 98 97*  CO2 24 26 27 27 28   GLUCOSE 413* 235* 123* 117* 184*  BUN 18 16 17 15 13   CREATININE 0.92 1.00 1.04* 1.08* 1.08*  CALCIUM 8.6* 8.1* 8.3* 8.0* 8.0*  MG 2.1  --   --   --   --   PHOS  --   --  5.8* 5.1* 4.9*    GFR: Estimated Creatinine Clearance: 63.4 mL/min (A) (by C-G formula based on SCr of 1.08 mg/dL (H)).  Liver Function Tests: Recent Labs  Lab 03/02/21 1419 03/03/21 0436 03/04/21 0542 03/05/21 0431 03/06/21 0625  AST 11* 13*  --   --   --   ALT 11 11  --   --   --   ALKPHOS 96 78  --   --   --   BILITOT 0.6 0.3  --   --   --   PROT 7.2 5.8*  --   --   --  ALBUMIN 1.9* 1.4* 1.5* 1.8* 1.5*    CBG: Recent Labs  Lab 03/06/21 0146 03/06/21 0225 03/06/21 0733 03/06/21 1123 03/06/21 1623  GLUCAP 36* 143* 149* 186* 276*    Recent Results (from the past 240 hour(s))  Resp Panel by RT-PCR (Flu A&B, Covid) Nasopharyngeal Swab     Status: None   Collection Time: 03/02/21  3:32 PM   Specimen: Nasopharyngeal Swab; Nasopharyngeal(NP) swabs in vial transport medium  Result Value Ref Range Status   SARS Coronavirus 2 by RT PCR NEGATIVE NEGATIVE Final    Comment: (NOTE) SARS-CoV-2 target nucleic acids are NOT DETECTED.  The SARS-CoV-2 RNA is generally detectable in upper respiratory specimens during the acute phase of infection. The lowest concentration of SARS-CoV-2 viral copies this assay can detect is 138 copies/mL. A negative result does not preclude SARS-Cov-2 infection and should not be used as the sole basis for treatment or other patient management decisions. A negative result may occur with  improper specimen collection/handling, submission of specimen other than nasopharyngeal swab, presence of viral mutation(s) within the areas targeted by this assay, and inadequate number of viral copies(<138 copies/mL). A negative result must be combined with clinical observations, patient history, and  epidemiological information. The expected result is Negative.  Fact Sheet for Patients:  EntrepreneurPulse.com.au  Fact Sheet for Healthcare Providers:  IncredibleEmployment.be  This test is no t yet approved or cleared by the [place] FDA and  has been authorized for detection and/or diagnosis of SARS-CoV-2 by FDA under an Emergency Use Authorization (EUA). This EUA will remain  in effect (meaning this test can be used) for the duration of the COVID-19 declaration under Section 564(b)(1) of the Act, 21 U.S.C.section 360bbb-3(b)(1), unless the authorization is terminated  or revoked sooner.       Influenza A by PCR NEGATIVE NEGATIVE Final   Influenza B by PCR NEGATIVE NEGATIVE Final    Comment: (NOTE) The Xpert Xpress SARS-CoV-2/FLU/RSV plus assay is intended as an aid in the diagnosis of influenza from Nasopharyngeal swab specimens and should not be used as a sole basis for treatment. Nasal washings and aspirates are unacceptable for Xpert Xpress SARS-CoV-2/FLU/RSV testing.  Fact Sheet for Patients: EntrepreneurPulse.com.au  Fact Sheet for Healthcare Providers: IncredibleEmployment.be  This test is not yet approved or cleared by the [place] FDA and has been authorized for detection and/or diagnosis of SARS-CoV-2 by FDA under an Emergency Use Authorization (EUA). This EUA will remain in effect (meaning this test can be used) for the duration of the COVID-19 declaration under Section 564(b)(1) of the Act, 21 U.S.C. section 360bbb-3(b)(1), unless the authorization is terminated or revoked.  Performed at [facility], [address] [place], [place] [address]   Culture, blood (Routine X 2) w Reflex to ID Panel     Status: None (Preliminary result)   Collection Time: 03/03/21  4:48 PM   Specimen: BLOOD LEFT HAND  Result Value Ref Range Status   Specimen Description BLOOD LEFT HAND  Final    Special Requests   Final    Blood Culture adequate volume BOTTLES DRAWN AEROBIC AND ANAEROBIC   Culture   Final    NO GROWTH 3 DAYS Performed at [facility], [address] [place], [place] [address]    Report Status PENDING  Incomplete  Culture, blood (Routine X 2) w Reflex to ID Panel     Status: None (Preliminary result)   Collection Time: 03/03/21  4:54 PM   Specimen: Left Antecubital; Blood  Result  Value Ref Range Status   Specimen Description LEFT ANTECUBITAL  Final   Special Requests   Final    Blood Culture adequate volume BOTTLES DRAWN AEROBIC AND ANAEROBIC   Culture   Final    NO GROWTH 3 DAYS Performed at [facility], [address] [place], [place] [address]    Report Status PENDING  Incomplete  Culture, blood (routine x 2)     Status: None (Preliminary result)   Collection Time: 03/05/21  4:31 AM   Specimen: Left Antecubital; Blood  Result Value Ref Range Status   Specimen Description   Final    LEFT ANTECUBITAL BOTTLES DRAWN AEROBIC AND ANAEROBIC Performed at [facility], [address], [place], [place] [address]    Special Requests   Final    Blood Culture adequate volume Performed at [facility], [address] [place], [place] [address]    Culture  Setup Time   Final    AEROBIC BOTTLE GRAM POSITIVE COCCI IN CLUSTERS CRITICAL RESULT CALLED TO, READ BACK BY AND VERIFIED WITH: MOODY,N [id number] 03/05/2021 COLEMAN,R ANAEROBIC BOTTLE GRAM POSITIVE COCCI IN CLUSTERS RESULTS PREVIOUSLY CALLED Organism ID to follow CRITICAL RESULT CALLED TO, READ BACK BY AND VERIFIED WITH: Performed at [facility], [place] [address], [place], [place] [address]    Culture   Final    NO GROWTH 1 DAY Performed at [facility], [address] [place], [place] [address]    Report Status PENDING  Incomplete  Culture, blood (routine x 2)     Status: None (Preliminary result)   Collection Time: 03/05/21  4:31 AM   Specimen: BLOOD LEFT HAND  Result Value Ref Range Status    Specimen Description   Final    BLOOD LEFT HAND BOTTLES DRAWN AEROBIC AND ANAEROBIC   Special Requests Blood Culture adequate volume  Final   Culture   Final    NO GROWTH 1 DAY Performed at [facility], [address], [place], [place] [address]    Report Status PENDING  Incomplete  Blood Culture ID Panel (Reflexed)     Status: Abnormal   Collection Time: 03/05/21  4:31 AM  Result Value Ref Range Status   Enterococcus faecalis NOT DETECTED NOT DETECTED Final   Enterococcus Faecium NOT DETECTED NOT DETECTED Final   Listeria monocytogenes NOT DETECTED NOT DETECTED Final   Staphylococcus species DETECTED (A) NOT DETECTED Final    Comment: CRITICAL RESULT CALLED TO, READ BACK BY AND VERIFIED WITH: RN CANDICE ROSE BY MESSAN H. AT 0332 ON 03/06/2021    Staphylococcus aureus (BCID) DETECTED (A) NOT DETECTED Final    Comment: CRITICAL RESULT CALLED TO, READ BACK BY AND VERIFIED WITH: RN CANDICE ROSE BY MESSAN H. AT 0332 ON 03/06/2021    Staphylococcus epidermidis NOT DETECTED NOT DETECTED Final   Staphylococcus lugdunensis NOT DETECTED NOT DETECTED Final   Streptococcus species NOT DETECTED NOT DETECTED Final   Streptococcus agalactiae NOT DETECTED NOT DETECTED Final   Streptococcus pneumoniae NOT DETECTED NOT DETECTED Final   Streptococcus pyogenes NOT DETECTED NOT DETECTED Final   A.calcoaceticus-baumannii NOT DETECTED NOT DETECTED Final   Bacteroides fragilis NOT DETECTED NOT DETECTED Final   Enterobacterales NOT DETECTED NOT DETECTED Final   Enterobacter cloacae complex NOT DETECTED NOT DETECTED Final   Escherichia coli NOT DETECTED NOT DETECTED Final   Klebsiella aerogenes NOT DETECTED NOT DETECTED Final   Klebsiella oxytoca NOT DETECTED NOT DETECTED Final   Klebsiella pneumoniae NOT DETECTED NOT DETECTED Final   Proteus  species NOT DETECTED NOT DETECTED Final   Salmonella species NOT DETECTED NOT DETECTED Final   Serratia marcescens NOT DETECTED NOT DETECTED Final   Haemophilus  influenzae NOT DETECTED NOT DETECTED Final   Neisseria meningitidis NOT DETECTED NOT DETECTED Final   Pseudomonas aeruginosa NOT DETECTED NOT DETECTED Final   Stenotrophomonas maltophilia NOT DETECTED NOT DETECTED Final   Candida albicans NOT DETECTED NOT DETECTED Final   Candida auris NOT DETECTED NOT DETECTED Final   Candida glabrata NOT DETECTED NOT DETECTED Final   Candida krusei NOT DETECTED NOT DETECTED Final   Candida parapsilosis NOT DETECTED NOT DETECTED Final   Candida tropicalis NOT DETECTED NOT DETECTED Final   Cryptococcus neoformans/gattii NOT DETECTED NOT DETECTED Final   Meth resistant mecA/C and MREJ NOT DETECTED NOT DETECTED Final    Comment: Performed at [facility], [place] [address], [place], [place] [address]     Radiology Studies: MR Lumbar Spine W Wo Contrast  Result Date: 03/06/2021 CLINICAL DATA:  Staph bacteremia. Low back pain. Bilateral leg weakness. EXAM: MRI LUMBAR SPINE WITHOUT AND WITH CONTRAST TECHNIQUE: Multiplanar and multiecho pulse sequences of the lumbar spine were obtained without and with intravenous contrast. CONTRAST:  9m GADAVIST GADOBUTROL 1 MMOL/ML IV SOLN COMPARISON:  None. FINDINGS: Segmentation:  5 lumbar type vertebral bodies. Alignment:  Normal Vertebrae:  Normal.  No evidence of bone or joint infection. Conus medullaris and cauda equina: Conus extends to the L1 level. Conus and cauda equina appear normal. Paraspinal and other soft tissues: Deep para spinous soft tissues are normal. No evidence of epidural space infection. Nonspecific edema pattern/fluid accumulation within the fatty tissues of the back, superficial to the paraspinous muscles, extending from T12-S1. This is more pronounced than usually seen with simple dependent positioning and the patient could possibly have a fascial plane infection in the back. Disc levels: Mild bulging of the discs at L3-4, L4-5 and L5-S1 but no apparent compressive stenosis. IMPRESSION: 1. No evidence of  epidural space infection. No evidence of spinal infection. 2. Nonspecific edema pattern within the fatty tissues of the back throughout the lumbar region, superficial to the paraspinous muscles. This is more pronounced than usually seen with simple dependent positioning and the patient could possibly have a fascial plane infection in the back. Electronically Signed   By: MNelson ChimesM.D.   On: 03/06/2021 14:33   ECHOCARDIOGRAM COMPLETE  Result Date: 03/06/2021    ECHOCARDIOGRAM REPORT   Patient Name:   Diamond GIANNETTIDate of Exam: 03/06/2021 Medical Rec #:  [id number]       Height:       59.0 in Accession #:    [id number]      Weight:       140.7 lb Date of Birth:  505/04/1996       BSA:          1.588 m Patient Age:    24 years         BP:           134/96 mmHg Patient Gender: F                HR:           96 bpm. Exam [facility]  [facility]: 2D Echo Indications:    Bacteremia R78.81  History:        Patient has prior history of Echocardiogram examinations, most  recent 10/19/2020. Risk Factors:Non-Smoker, Diabetes and                 Hypertension. GERD, Pneumonia.  Sonographer:    Leavy Cella RDCS (AE) Referring Phys: [place]  1. Moderate LVH, myocardium with somewhat speckled appearance. Mild valve thickening. Trivial pericardial effusion. Findings could suggest infiltrative cardiac disease. Consider outpatient cardiac MRI for further evaluation. . Left ventricular ejection fraction, by estimation, is 65 to 70%. The left ventricle has normal function. The left ventricle has no regional wall motion abnormalities. There is moderate left ventricular hypertrophy. Left ventricular diastolic parameters were normal.  2. Right ventricular systolic function is normal. The right ventricular size is normal.  3. The pericardial effusion is circumferential.  4. The mitral valve is abnormal. Trivial mitral valve regurgitation. No evidence of mitral stenosis.  5.  The aortic valve is tricuspid. There is mild thickening of the aortic valve. Aortic valve regurgitation is not visualized. No aortic stenosis is present.  6. The inferior vena cava is normal in size with greater than 50% respiratory variability, suggesting right atrial pressure of 3 mmHg. FINDINGS  Left Ventricle: Moderate LVH, myocardium with somewhat speckled appearance. Mild valve thickening. Trivial pericardial effusion. Findings could suggest infiltrative cardiac disease. Consider outpatient cardiac MRI for further evaluation. Left ventricular ejection fraction, by estimation, is 65 to 70%. The left ventricle has normal function. The left ventricle has no regional wall motion abnormalities. The left ventricular internal cavity size was normal in size. There is moderate left ventricular hypertrophy. Left ventricular diastolic parameters were normal. Right Ventricle: The right ventricular size is normal. No increase in right ventricular wall thickness. Right ventricular systolic function is normal. Left Atrium: Left atrial size was normal in size. Right Atrium: Right atrial size was normal in size. Pericardium: Trivial pericardial effusion is present. The pericardial effusion is circumferential. Mitral Valve: The mitral valve is abnormal. There is mild thickening of the mitral valve leaflet(s). Trivial mitral valve regurgitation. No evidence of mitral valve stenosis. Tricuspid Valve: The tricuspid valve is normal in structure. Tricuspid valve regurgitation is not demonstrated. No evidence of tricuspid stenosis. Aortic Valve: The aortic valve is tricuspid. There is mild thickening of the aortic valve. Aortic valve regurgitation is not visualized. No aortic stenosis is present. Aortic valve mean gradient measures 4.8 mmHg. Aortic valve peak gradient measures 8.7 mmHg. Aortic valve area, by VTI measures 2.07 cm. Pulmonic Valve: The pulmonic valve was not well visualized. Pulmonic valve regurgitation is not  visualized. No evidence of pulmonic stenosis. Aorta: The aortic root is normal in size and structure. Pulmonary Artery: Indeterminant PASP, inadequate TR jet. Venous: The inferior vena cava is normal in size with greater than 50% respiratory variability, suggesting right atrial pressure of 3 mmHg. IAS/Shunts: No atrial level shunt detected by color flow Doppler.  LEFT VENTRICLE PLAX 2D LVIDd:         3.42 cm  Diastology LVIDs:         1.76 cm  LV e' medial:    12.50 cm/s LV PW:         1.40 cm  LV E/e' medial:  5.9 LV IVS:        1.35 cm  LV e' lateral:   13.80 cm/s LVOT diam:     1.70 cm  LV E/e' lateral: 5.4 LV SV:         56 LV SV Index:   35 LVOT Area:     2.27 cm  RIGHT VENTRICLE RV S prime:     19.20 cm/s TAPSE (M-mode): 2.9 cm LEFT ATRIUM             Index       RIGHT ATRIUM           Index LA diam:        3.20 cm 2.02 cm/m  RA Area:     10.40 cm LA Vol (A2C):   40.0 ml 25.19 ml/m RA Volume:   21.10 ml  13.29 ml/m LA Vol (A4C):   27.6 ml 17.38 ml/m LA Biplane Vol: 33.0 ml 20.78 ml/m  AORTIC VALVE AV Area (Vmax):    1.99 cm AV Area (Vmean):   2.07 cm AV Area (VTI):     2.07 cm AV Vmax:           147.27 cm/s AV Vmean:          104.500 cm/s AV VTI:            0.270 m AV Peak Grad:      8.7 mmHg AV Mean Grad:      4.8 mmHg LVOT Vmax:         129.02 cm/s LVOT Vmean:        95.318 cm/s LVOT VTI:          0.247 m LVOT/AV VTI ratio: 0.91  AORTA Ao Root diam: 2.00 cm MITRAL VALVE MV Area (PHT): 3.97 cm    SHUNTS MV Decel Time: 191 msec    Systemic VTI:  0.25 m MV E velocity: 74.00 cm/s  Systemic Diam: 1.70 cm MV A velocity: 71.30 cm/s MV E/A ratio:  1.04 [name] MD Electronically signed by [name] MD Signature Date/Time: 03/06/2021/10:32:44 AM    Final     Scheduled Meds: . acetaminophen  650 mg Oral Q6H  . dextrose  25 mL Intravenous Once  . enoxaparin (LOVENOX) injection  40 mg Subcutaneous Q24H  . furosemide  40 mg Intravenous BID  . insulin aspart  0-9 Units Subcutaneous TID WC  .  insulin aspart  3 Units Subcutaneous TID WC  . insulin glargine  12 Units Subcutaneous Daily  . lisinopril  20 mg Oral Daily  . pantoprazole  40 mg Oral Daily  . potassium chloride  10 mEq Oral Daily  . tamsulosin  0.4 mg Oral Daily   Continuous Infusions: . DAPTOmycin (CUBICIN)  IV 500 mg (03/06/21 1300)     LOS: 4 days   Time spent: 36 mins   Lalia Loudon Wynetta Emery, MD How to contact the [facility] Attending or Consulting provider [place] or covering provider during after hours [place], for this patient?  1. Check the care team in [facility] and look for a) attending/consulting [facility] provider listed and b) the [facility] team listed 2. Log into [email or web address] and use [place]'s universal password to access. If you do not have the password, please contact the hospital operator. 3. Locate the [facility] provider you are looking for under [facility] and page to a number that you can be directly reached. 4. If you still have difficulty reaching the provider, please page the [facility] (Director on Call) for the Hospitalists listed on amion for assistance.  03/06/2021, 6:10 PM

## 2021-03-06 NOTE — Progress Notes (Signed)
*  PRELIMINARY RESULTS* Echocardiogram 2D Echocardiogram has been performed.  Diamond [name] 03/06/2021, 9:21 AM

## 2021-03-06 NOTE — Progress Notes (Signed)
[facility] has been contacted via receptionist r/t departure to [place] for IR services to get renal biopsy done-pt needs to be there at 10am-roundtrip. Pt made aware of transport as well. [facility] confirmed.

## 2021-03-06 NOTE — Progress Notes (Signed)
Admit: 03/02/2021 LOS: 4  78F DM1 since 25yo admit with recurrent anasarca, weight gain, and DOE with pneumonia, nephrotic syndrome.  Subjective:  . C/o pain ongoing in abdomen and back . 3.9L net negative, 3.9L neg yesterday . Lasix 40 IV BID . SAlb 1.5, SCr 1.08 . BPs stable . Friends at bedside, pt updated re renal status, discussed biopsy . Serologies negative . 24h U P pending, historical UP/C 3.95.   04/03 0701 - 04/04 0700 In: 021 [P.O.:720; IV Piggyback:150] Out: 3900 [Urine:3900]  Filed Weights   03/02/21 1151 03/02/21 1906 03/03/21 0504  Weight: 66.5 kg 64 kg 63.8 kg    Scheduled Meds: . acetaminophen  650 mg Oral Q6H  . dextrose  25 mL Intravenous Once  . enoxaparin (LOVENOX) injection  40 mg Subcutaneous Q24H  . furosemide  40 mg Intravenous BID  . insulin aspart  0-9 Units Subcutaneous TID WC  . insulin aspart  3 Units Subcutaneous TID WC  . insulin glargine  12 Units Subcutaneous Daily  . lisinopril  20 mg Oral Daily  . pantoprazole  40 mg Oral Daily  . potassium chloride  10 mEq Oral Daily  . tamsulosin  0.4 mg Oral Daily   Continuous Infusions: PRN Meds:.albuterol, hydrALAZINE, ondansetron (ZOFRAN) IV, oxyCODONE, polyethylene glycol, prochlorperazine  Current Labs: reviewed, serologies negative   Physical Exam:  Blood pressure (!) 134/96, pulse 96, temperature (!) 97.5 F (36.4 C), temperature source Oral, resp. rate 18, height 4' 11"  (1.499 m), weight 63.8 kg, SpO2 100 %. Young female NAD Regular, normal rate, nl s1s2 Diminished in bases, no crackles, nl WOB, no wheezing S/nt/nd 2+ LEE Nonfocal NCAT EOMI  A 1. Nephrotic Syndrome: anasarca, hypoalbuminemia in context of #2; negative serologies 2. DM1, not always well controlled, since 25yo 3. HTN on ACEi, lasix, stable 4. ? CAP, per TRH 5. Anemia Hb 8s, TSAT 8%, s/p IV Fe 03/03/21  P . Will order renal biopsy: paper biopsy form in paper chart . Cont current diuretic dosing . HTN, max ACEi as  able.   . Daily weights, Daily Renal Panel, Strict I/Os, Avoid nephrotoxins (NSAIDs, judicious IV Contrast)   Pearson Grippe MD 03/06/2021, 8:45 AM  Recent Labs  Lab 03/04/21 0542 03/05/21 0431 03/06/21 0625  NA 135 136 134*  K 3.9 4.4 4.2  CL 99 98 97*  CO2 27 27 28   GLUCOSE 123* 117* 184*  BUN 17 15 13   CREATININE 1.04* 1.08* 1.08*  CALCIUM 8.3* 8.0* 8.0*  PHOS 5.8* 5.1* 4.9*   Recent Labs  Lab 03/02/21 1419 03/03/21 0436 03/04/21 0542 03/05/21 0431 03/06/21 0625  WBC 12.1*   < > 11.2* 13.7* 12.8*  NEUTROABS 8.6*  --   --   --   --   HGB 9.4*   < > 8.6* 8.2* 8.0*  HCT 29.0*   < > 26.7* 26.2* 26.2*  MCV 89.5   < > 90.8 92.9 92.6  PLT 559*   < > 474* 524* 495*   < > = values in this interval not displayed.

## 2021-03-06 NOTE — Progress Notes (Signed)
Pharmacy Antibiotic Note  Diamond [name] is a 25 y.o. female admitted on 03/02/2021 with bacteremia   Pharmacy has been consulted for Daptomycin dosing.  Plan: Daptomycin 51m/kg q24h (5051m F/u cxs and clinical progress CPK ordered Monitor V/S, labs.   Height: 4' 11"  (149.9 cm) Weight: 63.8 kg (140 lb 10.5 oz) IBW/kg (Calculated) : 43.2  Temp (24hrs), Avg:98.5 F (36.9 C), Min:97.5 F (36.4 C), Max:99.5 F (37.5 C)  Recent Labs  Lab 03/02/21 1419 03/02/21 1421 03/02/21 1659 03/03/21 0436 03/04/21 0542 03/05/21 0431 03/06/21 0625  WBC 12.1*  --   --  12.6* 11.2* 13.7* 12.8*  CREATININE 0.92  --   --  1.00 1.04* 1.08* 1.08*  LATICACIDVEN  --  1.1 1.8  --   --   --   --     Estimated Creatinine Clearance: 65.2 mL/min (A) (by C-G formula based on SCr of 1.08 mg/dL (H)).    Allergies  Allergen Reactions  . Amoxicillin Hives    Did it involve swelling of the face/tongue/throat, SOB, or low BP? no  Did it involve sudden or severe rash/hives, skin peeling, or any reaction on the inside of your mouth or nose? no Did you need to seek medical attention at a hospital or doctor's office? no When did it last happen?unk If all above answers are "NO", may proceed with cephalosporin use.  . Marland Kitchenexamethasone Hives and Itching  . Doxycycline Itching and Swelling  . Penicillins Hives and Itching    Did it involve swelling of the face/tongue/throat, SOB, or low BP? no  Did it involve sudden or severe rash/hives, skin peeling, or any reaction on the inside of your mouth or nose? no Did you need to seek medical attention at a hospital or doctor's office? no When did it last happen?unk If all above answers are "NO", may proceed with cephalosporin use. Did it involve swelling of the face/tongue/throat, SOB, or low BP? no  Did it involve sudden or severe rash/hives, skin peeling, or any reaction on the inside of your mouth or nose? no Did you need to seek medical attention at a  hospital or doctor's office? no When did it last happen?unk If all above answers are "NO", may proceed with cephalosporin use. Tolerated keflex May 2021 with no issue.   . Ceftriaxone Sodium In Dextrose Itching  . Augmentin [Amoxicillin-Pot Clavulanate] Other (See Comments)    Reaction unknown  . Clavulanic Acid   . Adhesive [Tape] Itching and Rash  . Latex Itching and Rash  . Other Itching and Rash  . Pineapple Rash    Rash on tongue and throat    Antimicrobials this admission: Levaquin 4/3 >> 4/4 Daptomycin 4/4>>  Microbiology results: 4/3 BCx: MSSA Staphylococcus aureus (BCID) NOT DETECTED DETECTEDAbnormal   4/3 BCx: ngtd  Thank you for allowing pharmacy to be a part of this patient's care.  LoIsac SarnaBS Pharm D, [place] Pharmacist Pager #[phone]/03/2021 11:58 AM

## 2021-03-06 NOTE — Progress Notes (Signed)
Pt's mom bought in capsaicin hot patch's for pt to place on her low back, MD Dr. Wynetta Emery notified and gave the ok for pt to use only after pharmacy looks over them and labels them for pt's usage. Pt and family told this information and patch's placed in pharmacy bag and pt label and placed in med room. Information placed on report sheet for oncoming nurse to pass along to first shift for pharmacy to come up and evaluate medication and label.

## 2021-03-06 NOTE — Progress Notes (Signed)
Hypoglycemic Event  CBG: 36  Treatment: 4 oz juice/soda and D50 25 mL (12.5 gm)  Symptoms: sleepy, pt was up on [facility] and called staff to check BGL.   Follow-up CBG: Time:0225 CBG Result:143  Possible Reasons for Event: Unknown  Comments: this is the 3rd night this has happened. Pt always request Grape juice when CBG drops. Pt did not get any insulin during this RN's shift.    Marlou Starks

## 2021-03-06 NOTE — Progress Notes (Addendum)
IR consulted by Dr. Joelyn Oms for possible image-guided random renal biopsy secondary to nephrotic syndrome.  Plan for image-guided random renal biopsy tentatively for tomorrow 03/07/2021. Patient will be NPO at midnight prior to procedure. Will hold tonight's dose of Lovenox. INR pending. Glenard Haring, RN to set up carelink round trip (patient to arrive to [facility] at 1000 tomorrow, will return to [facility] following procedure). Patient will be seen/consented prior to procedure at [facility] tomorrow AM. Glenard Haring, RN aware of above.  Please call IR with questions/concerns.   Bea Graff Carin Shipp, PA-C 03/06/2021, 11:07 AM

## 2021-03-07 ENCOUNTER — Ambulatory Visit (HOSPITAL_COMMUNITY)
Admission: RE | Admit: 2021-03-07 | Discharge: 2021-03-07 | Disposition: A | Discharge status: Home / Self Care | Payer: Self-pay | Source: Ambulatory Visit | Attending: Nephrology | Admitting: Nephrology

## 2021-03-07 DIAGNOSIS — Z79899 Other long term (current) drug therapy: Secondary | ICD-10-CM | POA: Insufficient documentation

## 2021-03-07 DIAGNOSIS — K5090 Crohn's disease, unspecified, without complications: Secondary | ICD-10-CM | POA: Insufficient documentation

## 2021-03-07 DIAGNOSIS — E1065 Type 1 diabetes mellitus with hyperglycemia: Secondary | ICD-10-CM | POA: Insufficient documentation

## 2021-03-07 DIAGNOSIS — Z91040 Latex allergy status: Secondary | ICD-10-CM | POA: Insufficient documentation

## 2021-03-07 DIAGNOSIS — R609 Edema, unspecified: Secondary | ICD-10-CM | POA: Insufficient documentation

## 2021-03-07 DIAGNOSIS — N049 Nephrotic syndrome with unspecified morphologic changes: Secondary | ICD-10-CM | POA: Insufficient documentation

## 2021-03-07 DIAGNOSIS — Z9102 Food additives allergy status: Secondary | ICD-10-CM | POA: Insufficient documentation

## 2021-03-07 DIAGNOSIS — I10 Essential (primary) hypertension: Secondary | ICD-10-CM | POA: Insufficient documentation

## 2021-03-07 DIAGNOSIS — Z881 Allergy status to other antibiotic agents status: Secondary | ICD-10-CM | POA: Insufficient documentation

## 2021-03-07 DIAGNOSIS — K219 Gastro-esophageal reflux disease without esophagitis: Secondary | ICD-10-CM | POA: Insufficient documentation

## 2021-03-07 DIAGNOSIS — Z794 Long term (current) use of insulin: Secondary | ICD-10-CM | POA: Insufficient documentation

## 2021-03-07 DIAGNOSIS — Z888 Allergy status to other drugs, medicaments and biological substances status: Secondary | ICD-10-CM | POA: Insufficient documentation

## 2021-03-07 DIAGNOSIS — Z880 Allergy status to penicillin: Secondary | ICD-10-CM | POA: Insufficient documentation

## 2021-03-07 LAB — GLUCOSE, CAPILLARY
Glucose-Capillary: 291 mg/dL — ABNORMAL HIGH (ref 70–99)
Glucose-Capillary: 309 mg/dL — ABNORMAL HIGH (ref 70–99)
Glucose-Capillary: 148 mg/dL — ABNORMAL HIGH (ref 70–99)
Glucose-Capillary: 331 mg/dL — ABNORMAL HIGH (ref 70–99)

## 2021-03-07 LAB — RENAL FUNCTION PANEL
Albumin: 1.7 g/dL — ABNORMAL LOW (ref 3.5–5.0)
Anion gap: 10 (ref 5–15)
BUN: 15 mg/dL (ref 6–20)
CO2: 28 mmol/L (ref 22–32)
Calcium: 8.2 mg/dL — ABNORMAL LOW (ref 8.9–10.3)
Chloride: 96 mmol/L — ABNORMAL LOW (ref 98–111)
Creatinine, Ser: 1.57 mg/dL — ABNORMAL HIGH (ref 0.44–1.00)
GFR, Estimated: 47 mL/min — ABNORMAL LOW (ref >60)
Glucose, Bld: 349 mg/dL — ABNORMAL HIGH (ref 70–99)
Phosphorus: 5 mg/dL — ABNORMAL HIGH (ref 2.5–4.6)
Potassium: 4.8 mmol/L (ref 3.5–5.1)
Sodium: 134 mmol/L — ABNORMAL LOW (ref 135–145)

## 2021-03-07 LAB — CBC
HCT: 24.7 % — ABNORMAL LOW (ref 36.0–46.0)
Hemoglobin: 7.5 g/dL — ABNORMAL LOW (ref 12.0–15.0)
MCH: 28.4 pg (ref 26.0–34.0)
MCHC: 30.4 g/dL (ref 30.0–36.0)
MCV: 93.6 fL (ref 80.0–100.0)
Platelets: 502 10*3/uL — ABNORMAL HIGH (ref 150–400)
RBC: 2.64 MIL/uL — ABNORMAL LOW (ref 3.87–5.11)
RDW: 13 % (ref 11.5–15.5)
WBC: 11.7 10*3/uL — ABNORMAL HIGH (ref 4.0–10.5)
nRBC: 0 % (ref 0.0–0.2)

## 2021-03-07 LAB — PREGNANCY, URINE: Preg Test, Ur: NEGATIVE

## 2021-03-07 MED ORDER — MIDAZOLAM HCL 2 MG/2ML IJ SOLN
INTRAMUSCULAR | Status: AC | PRN
  Administered 2021-03-07 (×2): 1 mg via INTRAVENOUS

## 2021-03-07 MED ORDER — LIDOCAINE HCL (PF) 1 % IJ SOLN
INTRAMUSCULAR | Status: AC
  Filled 2021-03-07: qty 30

## 2021-03-07 MED ORDER — INSULIN GLARGINE 100 UNIT/ML ~~LOC~~ SOLN
14.0000 [IU] | Freq: Every day | SUBCUTANEOUS | Status: DC
  Administered 2021-03-08: 14 [IU] via SUBCUTANEOUS
  Filled 2021-03-07 (×3): qty 0.14

## 2021-03-07 MED ORDER — INSULIN ASPART 100 UNIT/ML ~~LOC~~ SOLN: 4.0000 [IU] | Freq: Three times a day (TID) | SUBCUTANEOUS | Status: DC

## 2021-03-07 MED ORDER — GELATIN ABSORBABLE 12-7 MM EX MISC
CUTANEOUS | Status: AC
  Filled 2021-03-07: qty 1

## 2021-03-07 MED ORDER — FENTANYL CITRATE (PF) 100 MCG/2ML IJ SOLN
INTRAMUSCULAR | Status: AC | PRN
  Administered 2021-03-07: 25 ug via INTRAVENOUS

## 2021-03-07 MED ORDER — FENTANYL CITRATE (PF) 100 MCG/2ML IJ SOLN
INTRAMUSCULAR | Status: AC
  Filled 2021-03-07: qty 2

## 2021-03-07 MED ORDER — FUROSEMIDE 40 MG PO TABS
40.0000 mg | ORAL_TABLET | Freq: Two times a day (BID) | ORAL | Status: DC
  Administered 2021-03-07: 40 mg via ORAL
  Filled 2021-03-07 (×2): qty 1

## 2021-03-07 MED ORDER — INSULIN ASPART 100 UNIT/ML ~~LOC~~ SOLN
4.0000 [IU] | Freq: Three times a day (TID) | SUBCUTANEOUS | Status: DC
  Administered 2021-03-07 – 2021-03-10 (×7): 4 [IU] via SUBCUTANEOUS

## 2021-03-07 MED ORDER — CEFAZOLIN SODIUM-DEXTROSE 2-4 GM/100ML-% IV SOLN
2.0000 g | Freq: Three times a day (TID) | INTRAVENOUS | Status: DC
  Administered 2021-03-07 – 2021-03-10 (×10): 2 g via INTRAVENOUS
  Filled 2021-03-07 (×11): qty 100

## 2021-03-07 MED ORDER — CAPSAICIN 0.025 % EX PTCH
1.0000 | MEDICATED_PATCH | Freq: Every day | CUTANEOUS | Status: DC | PRN
  Administered 2021-03-07 – 2021-03-08 (×2): 1 via CUTANEOUS

## 2021-03-07 MED ORDER — DIPHENHYDRAMINE HCL 25 MG PO CAPS
25.0000 mg | ORAL_CAPSULE | Freq: Four times a day (QID) | ORAL | Status: DC | PRN
  Administered 2021-03-07 – 2021-03-08 (×4): 25 mg via ORAL
  Filled 2021-03-07 (×4): qty 1

## 2021-03-07 MED ORDER — MIDAZOLAM HCL 2 MG/2ML IJ SOLN
INTRAMUSCULAR | Status: AC
  Filled 2021-03-07: qty 2

## 2021-03-07 NOTE — Progress Notes (Signed)
Pharmacy Antibiotic Note  Diamond [name] is a 25 y.o. female admitted on 03/02/2021 with bacteremia   Pharmacy has been consulted for Cefazolin dosing. Discussed allergies with patient and she does not remember the last time she had a PCN, but just remembers that it caused a rash and itchy throat. Dr. Wynetta Emery reviewed with patient and she is willing to trying cefazolin. Instructed to notify nursing if any issues with itchiness, difficulty with throat issues since cefazolin will be the preferred agent.   Plan: Cefazolin 2gm IV q8h F/u cxs and clinical progress Monitor V/S, labs.   Height: 4' 11"  (149.9 cm) Weight: 60.3 kg (133 lb) IBW/kg (Calculated) : 43.2  Temp (24hrs), Avg:98.1 F (36.7 C), Min:97.6 F (36.4 C), Max:99 F (37.2 C)  Recent Labs  Lab 03/02/21 1421 03/02/21 1659 03/03/21 0436 03/04/21 0542 03/05/21 0431 03/06/21 0625 03/07/21 0446  WBC  --   --  12.6* 11.2* 13.7* 12.8* 11.7*  CREATININE  --   --  1.00 1.04* 1.08* 1.08* 1.57*  LATICACIDVEN 1.1 1.8  --   --   --   --   --     Estimated Creatinine Clearance: 43.6 mL/min (A) (by C-G formula based on SCr of 1.57 mg/dL (H)).    Allergies  Allergen Reactions  . Amoxicillin Hives    Did it involve swelling of the face/tongue/throat, SOB, or low BP? no  Did it involve sudden or severe rash/hives, skin peeling, or any reaction on the inside of your mouth or nose? no Did you need to seek medical attention at a hospital or doctor's office? no When did it last happen?unk If all above answers are "NO", may proceed with cephalosporin use.  Marland Kitchen Dexamethasone Hives and Itching  . Doxycycline Itching and Swelling  . Penicillins Hives and Itching    Did it involve swelling of the face/tongue/throat, SOB, or low BP? no  Did it involve sudden or severe rash/hives, skin peeling, or any reaction on the inside of your mouth or nose? no Did you need to seek medical attention at a hospital or doctor's office? no When did it  last happen?unk If all above answers are "NO", may proceed with cephalosporin use. Did it involve swelling of the face/tongue/throat, SOB, or low BP? no  Did it involve sudden or severe rash/hives, skin peeling, or any reaction on the inside of your mouth or nose? no Did you need to seek medical attention at a hospital or doctor's office? no When did it last happen?unk If all above answers are "NO", may proceed with cephalosporin use. Tolerated keflex May 2021 with no issue.   . Ceftriaxone Sodium In Dextrose Itching  . Augmentin [Amoxicillin-Pot Clavulanate] Other (See Comments)    Reaction unknown  . Clavulanic Acid   . Adhesive [Tape] Itching and Rash  . Latex Itching and Rash  . Other Itching and Rash  . Pineapple Rash    Rash on tongue and throat    Antimicrobials this admission: Levaquin 4/3 >> 4/4 Daptomycin 4/4>> 4/5 Cefazolin 4/5>>  Microbiology results: 4/3 BCx: MSSA Staphylococcus aureus (BCID) NOT DETECTED DETECTEDAbnormal   4/3 BCx: ngtd 4/5 BCX: pending  Thank you for allowing pharmacy to be a part of this patient's care.  Isac Sarna, BS [place], [place] Clinical Pharmacist Pager [phone] 03/07/2021 9:36 AM

## 2021-03-07 NOTE — Consult Note (Signed)
Chief Complaint: Concern for nephrotic syndrome. Request is for kidney biopsy  Referring Physician(s): Dr. Alfonso Ellis  Supervising Physician: Corrie Mckusick  Patient Status:  [facility] - Inpatient  History of Present Illness: Diamond [name] is a 25 y.o. female History of DM multiple admissions for HTN, hyperglycemia and anasarca. Presented to the ED at [facility] for significant weight gain (30 pounds). Admitted for recurrent anasarca, multifocal pneumonia and nephrotic syndrome. Found to be MSSA bacteremic. Team is requesting a random kidney biopsy for further evaluation for possible nephrotic syndrome.  Currently without any significant complaints. Patient alert and sitting up on the side of the bed calm and comfortable. Denies any fevers, headache, chest pain, SOB, cough, abdominal pain, nausea, vomiting or bleeding. She reports anxiety regarding the upcoming IR procedure. Endorses urticaria at site on lidocaine patch. Return precautions and treatment recommendations and follow-up discussed with the patient who is agreeable with the plan.  Past Medical History:  Diagnosis Date  . ADHD (attention deficit hyperactivity disorder) 2007  . Allergy   . Asthma   . Bipolar disorder ([place])   . Crohn's colitis ([place])    Dx Dr. Cherrie Gauze  . Depression    early childhood, trial of seroquel?  . Diabetic neuropathy ([place])   . Edema   . GERD (gastroesophageal reflux disease)   . Hypertension   . Pancreatitis   . Panic attacks   . PTSD (post-traumatic stress disorder)   . Stroke ([place]) 07/2020   R side  . Type 1 diabetes ([place]) 2008    Past Surgical History:  Procedure Laterality Date  . ADENOIDECTOMY    . BIOPSY  02/01/2021   Procedure: BIOPSY;  Surgeon: Rogene Houston, MD;  Location: [facility];  Service: Endoscopy;;  duodenal, esophageal,  . BIOPSY  02/03/2021   Procedure: BIOPSY;  Surgeon: Eloise Harman, DO;  Location: [facility];  Service: Endoscopy;;  . COLONOSCOPY WITH  PROPOFOL N/A 02/01/2021   Procedure: COLONOSCOPY WITH PROPOFOL;  Surgeon: Rogene Houston, MD;  Location: [facility];  Service: Endoscopy;  Laterality: N/A;  . COLONOSCOPY WITH PROPOFOL N/A 02/03/2021   Procedure: COLONOSCOPY WITH PROPOFOL;  Surgeon: Eloise Harman, DO;  Location: [facility];  Service: Endoscopy;  Laterality: N/A;  . ESOPHAGOGASTRODUODENOSCOPY (EGD) WITH PROPOFOL N/A 02/01/2021   Procedure: ESOPHAGOGASTRODUODENOSCOPY (EGD) WITH PROPOFOL;  Surgeon: Rogene Houston, MD;  Location[facility];  Service: Endoscopy;  Laterality: N/A;  . HERNIA REPAIR    . TONSILLECTOMY      Allergies: Amoxicillin, Dexamethasone, Doxycycline, Penicillins, Ceftriaxone sodium in dextrose, Augmentin [amoxicillin-pot clavulanate], Clavulanic acid, Adhesive [tape], Latex, Other, and Pineapple  Medications: Prior to Admission medications   Medication Sig Start Date End Date Taking? Authorizing Provider  albuterol (VENTOLIN HFA) 108 (90 Base) MCG/ACT inhaler Inhale 2 puffs into the lungs every 6 (six) hours as needed. For shortness of breath 02/15/21  Yes [name], PA-C  furosemide (LASIX) 40 MG tablet Take 40 mg by mouth daily. 02/27/21  Yes [provider]  lisinopril (ZESTRIL) 20 MG tablet Take 1 tablet (20 mg total) by mouth daily. 02/15/21  Yes [name], PA-C  omeprazole (PRILOSEC) 40 MG capsule Take 1 capsule (40 mg total) by mouth daily. 02/15/21  Yes [name], PA-C  ondansetron (ZOFRAN ODT) 8 MG disintegrating tablet Take 1 tablet (8 mg total) by mouth every 8 (eight) hours as needed for nausea or vomiting. 02/15/21  Yes [name], PA-C  potassium chloride (MICRO-K) 10  MEQ CR capsule Take 10 mEq by mouth daily. 02/27/21  Yes [provider]  tamsulosin (FLOMAX) 0.4 MG CAPS capsule Take 1 capsule (0.4 mg total) by mouth daily. 02/15/21  Yes [name], PA-C  traZODone (DESYREL) 50 MG tablet Take 50 mg by mouth at bedtime.   Yes [provider]  gabapentin (NEURONTIN) 100 MG capsule Take 1 capsule (100 mg total) by mouth daily. Patient not taking: Reported on 03/02/2021 10/13/20   [name], PA-C  insulin glargine (LANTUS) 100 UNIT/ML injection Inject 15 Units into the skin at bedtime. 02/25/21 03/27/21  [provider]  insulin lispro (HUMALOG) 100 UNIT/ML KwikPen Inject 10-16 Units into the skin 3 (three) times daily before meals. 02/25/21 03/27/21  [provider]  metoCLOPramide (REGLAN) 5 MG tablet Take 1 tablet (5 mg total) by mouth every 8 (eight) hours as needed for refractory nausea / vomiting (refractory symptoms). 02/03/21 02/03/22  Barton Dubois, MD     Family History  Problem Relation Age of Onset  . Hypertension Mother   . Diabetes Mother   . Heart disease Mother   . Crohn's disease Father   . Alcohol abuse Father   . Drug abuse Father   . Mental illness Father   . Cancer Father   . Hypertension Maternal Aunt   . Seizures Maternal Aunt   . Depression Maternal Aunt   . Asthma Maternal Uncle   . Hypertension Maternal Uncle   . Cancer Maternal Grandmother   . Diabetes Maternal Grandmother   . Mental illness Maternal Grandfather     Social History   Socioeconomic History  . Marital status: Single    Spouse name: Not on file  . Number of children: Not on file  . Years of education: Not on file  . Highest education level: Not on file  Occupational History  . Not on file  Tobacco Use  . Smoking status: Never Smoker  . Smokeless tobacco: Never Used  Vaping Use  . Vaping Use: Former  Substance and Sexual Activity  . Alcohol use: No  . Drug use: No  . Sexual activity: Yes  Other Topics Concern  . Not on file  Social History Narrative  . Not on file   Social Determinants of Health   Financial Resource Strain: Not on file  Food Insecurity: Not on file  Transportation Needs: Not on file  Physical Activity: Not on file  Stress: Not on file  Social Connections: Not on  file     Review of Systems: A 12 point ROS discussed and pertinent positives are indicated in the HPI above.  All other systems are negative.  Review of Systems  Constitutional: Negative for fatigue and fever.  HENT: Negative for congestion.   Respiratory: Negative for cough and shortness of breath.   Gastrointestinal: Negative for abdominal pain, diarrhea, nausea and vomiting.  Skin:       reporting uticara at site of lidocaine patch.     Vital Signs: BP 127/83 (BP Location: Right Arm)   Pulse 98   Temp 97.6 F (36.4 C) (Oral)   Resp 16   Ht 4' 11"  (1.499 m)   Wt 133 lb (60.3 kg)   SpO2 100%   BMI 26.86 kg/m   Physical Exam Vitals and nursing note reviewed.  Constitutional:      Appearance: She is well-developed.  HENT:     Head: Normocephalic and atraumatic.  Eyes:     Conjunctiva/sclera: Conjunctivae normal.  Cardiovascular:  Rate and Rhythm: Normal rate and regular rhythm.     Heart sounds: Normal heart sounds.  Pulmonary:     Effort: Pulmonary effort is normal.     Breath sounds: Normal breath sounds.  Musculoskeletal:        General: Normal range of motion.     Cervical back: Normal range of motion.  Skin:    General: Skin is warm.  Neurological:     Mental Status: She is alert and oriented to person, place, and time.     Imaging: DG Chest 2 View  Result Date: 03/04/2021 CLINICAL DATA:  Fever and shortness of breath for several days. EXAM: CHEST - 2 VIEW COMPARISON:  03/02/2021 FINDINGS: Cardiomediastinal silhouette and pulmonary vasculature are within normal limits. Interval development of of bibasilar airspace opacities suspicious for pneumonia. IMPRESSION: Findings suspicious for multifocal pneumonia. Electronically Signed   By: Miachel Roux M.D.   On: 03/04/2021 10:28   MR Lumbar Spine W Wo Contrast  Result Date: 03/06/2021 CLINICAL DATA:  Staph bacteremia. Low back pain. Bilateral leg weakness. EXAM: MRI LUMBAR SPINE WITHOUT AND WITH CONTRAST  TECHNIQUE: Multiplanar and multiecho pulse sequences of the lumbar spine were obtained without and with intravenous contrast. CONTRAST:  86m GADAVIST GADOBUTROL 1 MMOL/ML IV SOLN COMPARISON:  None. FINDINGS: Segmentation:  5 lumbar type vertebral bodies. Alignment:  Normal Vertebrae:  Normal.  No evidence of bone or joint infection. Conus medullaris and cauda equina: Conus extends to the L1 level. Conus and cauda equina appear normal. Paraspinal and other soft tissues: Deep para spinous soft tissues are normal. No evidence of epidural space infection. Nonspecific edema pattern/fluid accumulation within the fatty tissues of the back, superficial to the paraspinous muscles, extending from T12-S1. This is more pronounced than usually seen with simple dependent positioning and the patient could possibly have a fascial plane infection in the back. Disc levels: Mild bulging of the discs at L3-4, L4-5 and L5-S1 but no apparent compressive stenosis. IMPRESSION: 1. No evidence of epidural space infection. No evidence of spinal infection. 2. Nonspecific edema pattern within the fatty tissues of the back throughout the lumbar region, superficial to the paraspinous muscles. This is more pronounced than usually seen with simple dependent positioning and the patient could possibly have a fascial plane infection in the back. Electronically Signed   By: MNelson ChimesM.D.   On: 03/06/2021 14:33   NM Pulmonary Perfusion  Result Date: 03/04/2021 CLINICAL DATA:  Positive D-dimer. Shortness of breath and chest pain several months. Low to intermediate probability for pulmonary embolism. EXAM: NUCLEAR MEDICINE PERFUSION LUNG SCAN TECHNIQUE: Perfusion images were obtained in multiple projections after intravenous injection of radiopharmaceutical. Ventilation scans intentionally deferred if perfusion scan and chest x-ray adequate for interpretation during COVID 19 epidemic. RADIOPHARMACEUTICALS:  4.4 mCi Tc-968mAA IV COMPARISON:  Two  view chest 03/04/2021 FINDINGS: Examination demonstrates normal perfusion images without focal peripheral wedge-shaped perfusion defect. IMPRESSION: Normal perfusion lung scan without evidence of pulmonary emboli. Electronically Signed   By: DaMarin Olp.D.   On: 03/04/2021 11:06   [place] Img Lower Bilateral (DVT)  Result Date: 03/04/2021 CLINICAL DATA:  Bilateral lower extremity edema.  Evaluate for DVT. EXAM: BILATERAL LOWER EXTREMITY VENOUS DOPPLER ULTRASOUND TECHNIQUE: Gray-scale sonography with graded compression, as well as color Doppler and duplex ultrasound were performed to evaluate the lower extremity deep venous systems from the level of the common femoral vein and including the common femoral, femoral, profunda femoral, popliteal and calf veins including the  posterior tibial, peroneal and gastrocnemius veins when visible. The superficial great saphenous vein was also interrogated. Spectral Doppler was utilized to evaluate flow at rest and with distal augmentation maneuvers in the common femoral, femoral and popliteal veins. COMPARISON:  Nuclear medicine V/Q scan-03/04/2021 Bilateral lower extremity venous Doppler ultrasound-01/24/2021 (negative). FINDINGS: RIGHT LOWER EXTREMITY Common Femoral Vein: No evidence of thrombus. Normal compressibility, respiratory phasicity and response to augmentation. Saphenofemoral Junction: No evidence of thrombus. Normal compressibility and flow on color Doppler imaging. Profunda Femoral Vein: No evidence of thrombus. Normal compressibility and flow on color Doppler imaging. Femoral Vein: No evidence of thrombus. Normal compressibility, respiratory phasicity and response to augmentation. Popliteal Vein: No evidence of thrombus. Normal compressibility, respiratory phasicity and response to augmentation. Calf Veins: No evidence of thrombus. Normal compressibility and flow on color Doppler imaging. Superficial Great Saphenous Vein: No evidence of thrombus. Normal  compressibility. Venous Reflux:  None. Other Findings: There is a moderate amount of subcutaneous edema at the level the right calf. Redemonstrated prominent right inguinal lymph nodes with index right inguinal lymph node measuring 0.8 cm in greatest short axis diameter (image 2), similar to the 01/24/2021 examination, presumably reactive in etiology. LEFT LOWER EXTREMITY Common Femoral Vein: No evidence of thrombus. Normal compressibility, respiratory phasicity and response to augmentation. Saphenofemoral Junction: No evidence of thrombus. Normal compressibility and flow on color Doppler imaging. Profunda Femoral Vein: No evidence of thrombus. Normal compressibility and flow on color Doppler imaging. Femoral Vein: No evidence of thrombus. Normal compressibility, respiratory phasicity and response to augmentation. Popliteal Vein: No evidence of thrombus. Normal compressibility, respiratory phasicity and response to augmentation. Calf Veins: No evidence of thrombus. Normal compressibility and flow on color Doppler imaging. Superficial Great Saphenous Vein: No evidence of thrombus. Normal compressibility. Venous Reflux:  None. Other Findings: There is a minimal to moderate amount of subcutaneous edema at the level of the left calf. IMPRESSION: No evidence of DVT within either lower extremity. Electronically Signed   By: Sandi Mariscal M.D.   On: 03/04/2021 11:56   DG Chest Port 1 View  Result Date: 03/02/2021 CLINICAL DATA:  Shortness of breath Gain 30 pound in 4 days Diffuse swelling EXAM: PORTABLE CHEST 1 VIEW COMPARISON:  02/02/2021 FINDINGS: The heart size and mediastinal contours are within normal limits. Both lungs are clear. The visualized skeletal structures are unremarkable. IMPRESSION: No active disease. Electronically Signed   By: Miachel Roux M.D.   On: 03/02/2021 14:19   ECHOCARDIOGRAM COMPLETE  Result Date: 03/06/2021    ECHOCARDIOGRAM REPORT   Patient Name:   TALICIA SUI Date of Exam: 03/06/2021  Medical Rec #:  [id number]        Height:       59.0 in Accession #:    [id number]       Weight:       140.7 lb Date of Birth:  Apr 22, 1996        BSA:          1.588 m Patient Age:    24 years         BP:           134/96 mmHg Patient Gender: F                HR:           96 bpm. Exam Location:  [facility] Procedure: 2D Echo Indications:    Bacteremia R78.81  History:        Patient has prior history of  Echocardiogram examinations, most                 recent 10/19/2020. Risk Factors:Non-Smoker, Diabetes and                 Hypertension. GERD, Pneumonia.  Sonographer:    Leavy Cella RDCS (AE) Referring Phys: [place]  1. Moderate LVH, myocardium with somewhat speckled appearance. Mild valve thickening. Trivial pericardial effusion. Findings could suggest infiltrative cardiac disease. Consider outpatient cardiac MRI for further evaluation. . Left ventricular ejection fraction, by estimation, is 65 to 70%. The left ventricle has normal function. The left ventricle has no regional wall motion abnormalities. There is moderate left ventricular hypertrophy. Left ventricular diastolic parameters were normal.  2. Right ventricular systolic function is normal. The right ventricular size is normal.  3. The pericardial effusion is circumferential.  4. The mitral valve is abnormal. Trivial mitral valve regurgitation. No evidence of mitral stenosis.  5. The aortic valve is tricuspid. There is mild thickening of the aortic valve. Aortic valve regurgitation is not visualized. No aortic stenosis is present.  6. The inferior vena cava is normal in size with greater than 50% respiratory variability, suggesting right atrial pressure of 3 mmHg. FINDINGS  Left Ventricle: Moderate LVH, myocardium with somewhat speckled appearance. Mild valve thickening. Trivial pericardial effusion. Findings could suggest infiltrative cardiac disease. Consider outpatient cardiac MRI for further evaluation. Left ventricular  ejection fraction, by estimation, is 65 to 70%. The left ventricle has normal function. The left ventricle has no regional wall motion abnormalities. The left ventricular internal cavity size was normal in size. There is moderate left ventricular hypertrophy. Left ventricular diastolic parameters were normal. Right Ventricle: The right ventricular size is normal. No increase in right ventricular wall thickness. Right ventricular systolic function is normal. Left Atrium: Left atrial size was normal in size. Right Atrium: Right atrial size was normal in size. Pericardium: Trivial pericardial effusion is present. The pericardial effusion is circumferential. Mitral Valve: The mitral valve is abnormal. There is mild thickening of the mitral valve leaflet(s). Trivial mitral valve regurgitation. No evidence of mitral valve stenosis. Tricuspid Valve: The tricuspid valve is normal in structure. Tricuspid valve regurgitation is not demonstrated. No evidence of tricuspid stenosis. Aortic Valve: The aortic valve is tricuspid. There is mild thickening of the aortic valve. Aortic valve regurgitation is not visualized. No aortic stenosis is present. Aortic valve mean gradient measures 4.8 mmHg. Aortic valve peak gradient measures 8.7 mmHg. Aortic valve area, by VTI measures 2.07 cm. Pulmonic Valve: The pulmonic valve was not well visualized. Pulmonic valve regurgitation is not visualized. No evidence of pulmonic stenosis. Aorta: The aortic root is normal in size and structure. Pulmonary Artery: Indeterminant PASP, inadequate TR jet. Venous: The inferior vena cava is normal in size with greater than 50% respiratory variability, suggesting right atrial pressure of 3 mmHg. IAS/Shunts: No atrial level shunt detected by color flow Doppler.  LEFT VENTRICLE PLAX 2D LVIDd:         3.42 cm  Diastology LVIDs:         1.76 cm  LV e' medial:    12.50 cm/s LV PW:         1.40 cm  LV E/e' medial:  5.9 LV IVS:        1.35 cm  LV e' lateral:    13.80 cm/s LVOT diam:     1.70 cm  LV E/e' lateral: 5.4 LV SV:  56 LV SV Index:   35 LVOT Area:     2.27 cm  RIGHT VENTRICLE RV S prime:     19.20 cm/s TAPSE (M-mode): 2.9 cm LEFT ATRIUM             Index       RIGHT ATRIUM           Index LA diam:        3.20 cm 2.02 cm/m  RA Area:     10.40 cm LA Vol (A2C):   40.0 ml 25.19 ml/m RA Volume:   21.10 ml  13.29 ml/m LA Vol (A4C):   27.6 ml 17.38 ml/m LA Biplane Vol: 33.0 ml 20.78 ml/m  AORTIC VALVE AV Area (Vmax):    1.99 cm AV Area (Vmean):   2.07 cm AV Area (VTI):     2.07 cm AV Vmax:           147.27 cm/s AV Vmean:          104.500 cm/s AV VTI:            0.270 m AV Peak Grad:      8.7 mmHg AV Mean Grad:      4.8 mmHg LVOT Vmax:         129.02 cm/s LVOT Vmean:        95.318 cm/s LVOT VTI:          0.247 m LVOT/AV VTI ratio: 0.91  AORTA Ao Root diam: 2.00 cm MITRAL VALVE MV Area (PHT): 3.97 cm    SHUNTS MV Decel Time: 191 msec    Systemic VTI:  0.25 m MV E velocity: 74.00 cm/s  Systemic Diam: 1.70 cm MV A velocity: 71.30 cm/s MV E/A ratio:  1.04 [name] MD Electronically signed by [name] MD Signature Date/Time: 03/06/2021/10:32:44 AM    Final     Labs:  CBC: Recent Labs    03/04/21 0542 03/05/21 0431 03/06/21 0625 03/07/21 0446  WBC 11.2* 13.7* 12.8* 11.7*  HGB 8.6* 8.2* 8.0* 7.5*  HCT 26.7* 26.2* 26.2* 24.7*  PLT 474* 524* 495* 502*    COAGS: Recent Labs    03/02/21 1419 03/06/21 1327  INR 1.0 1.1  APTT 35  --     BMP: Recent Labs    04/30/20 1739 08/25/20 1754 08/25/20 2248 08/26/20 0507 10/18/20 1212 03/04/21 0542 03/05/21 0431 03/06/21 0625 03/07/21 0446  NA 137 127* 135 135   < > 135 136 134* 134*  K 3.4* 3.9 3.4* 3.6   < > 3.9 4.4 4.2 4.8  CL 105 92* 98 102   < > 99 98 97* 96*  CO2 24 24 26 28    < > 27 27 28 28   GLUCOSE 53* 816* 235* 156*   < > 123* 117* 184* 349*  BUN 14 7 5* 5*   < > 17 15 13 15   CALCIUM 9.0 8.8* 8.6* 8.3*   < > 8.3* 8.0* 8.0* 8.2*  CREATININE 0.66 0.96 0.77 0.64    < > 1.04* 1.08* 1.08* 1.57*  GFRNONAA >60 >60 >60 >60   < > >60 >60 >60 47*  GFRAA >60 >60 >60 >60  --   --   --   --   --    < > = values in this interval not displayed.    LIVER FUNCTION TESTS: Recent Labs    02/01/21 4270 02/11/21 1911 03/02/21 1419 03/03/21 0436 03/04/21 0542 03/05/21 0431 03/06/21 6237 03/07/21 0446  BILITOT  1.1 0.3 0.6 0.3  --   --   --   --   AST 20 14* 11* 13*  --   --   --   --   ALT 18 16 11 11   --   --   --   --   ALKPHOS 60 113 96 78  --   --   --   --   PROT 5.6* 6.4* 7.2 5.8*  --   --   --   --   ALBUMIN 2.4* 2.5* 1.9* 1.4* 1.5* 1.8* 1.5* 1.7*     Assessment and Plan:  25 y.o. female inpatient. History of DM multiple admissions for HTN, hyperglycemia and anasarca. Present[facility] the ED at [facility] for significant weight gain (30 pounds). Admitted for recurrent anasarca, multifocal pneumonia and nephrotic syndrome. Found to be MSSA bacteremic. Team is requesting a random kidney biopsy for further evaluation for possible nephrotic syndrome.   WBC is 11.7, Cr 1.57, Phopshorus 5.0, GFR 47. Blood cultures are pending. Patient is on subcutaneous prophylactic dose of lovenox. Last dose given on 4.3.22 @ 22:36. Allergies include tape and latex please see list for additional allergies. Patient has been NPO since midnight,  Risks and benefits of kidney biopsy was discussed with the patient and/or patient's family including, but not limited to bleeding, infection, damage to adjacent structures or low yield requiring additional tests.  All of the questions were answered and there is agreement to proceed.  Consent signed and in chart.   Thank you for this interesting consult.  I greatly enjoyed meeting Diamond [name] and look forward to participating in their care.  A copy of this report was sent to the requesting provider on this date.  Electronically Signed: Jacqualine Mau, NP 03/07/2021, 10:35 AM   I spent a total of 40 Minutes    in face to face in  clinical consultation, greater than 50% of which was counseling/coordinating care for kidney biopsy

## 2021-03-07 NOTE — Progress Notes (Signed)
      INFECTIOUS DISEASE ATTENDING ADDENDUM:   Date: 03/07/2021  Patient name: Diamond [name]  Medical record number: [id number]  Date of birth: May 13, 1996   25 year old with MSSA bacteremia and severe low back pain. She has alleged cephalosporin allergy and therefore we used daptomycin instead of preferred treatment of ancef  Her TTE is negative and MRI spine negative  Cr is creeping up I did not realize she had multifocal pneumonia  I am switching her to vancomycin now since cubicin is inactivated by surfactant in the lungs and there have been failures with this agent with pneumonia  If she is willing to try cefazolin this would be much preferred to vancomycin which can be very nephrotoxic  She needs TEE and I have suspicon for right sided endocarditis given multifocal infiltrates that could be septic emboli  Will discuss with Dr. Gerre Couch 03/07/2021, 8:47 AM

## 2021-03-07 NOTE — Progress Notes (Signed)
    [facility] has been requested to perform a transesophageal echocardiogram on [name] for bacteremia. After careful review of history and examination, the risks and benefits of transesophageal echocardiogram have been explained including risks of esophageal damage, perforation (1:10,000 risk), bleeding, pharyngeal hematoma as well as other potential complications associated with conscious sedation including aspiration, arrhythmia, respiratory failure and death. Alternatives to treatment were discussed, questions were answered. Patient is willing to proceed.   BP stable without the requirement for pressor support. Hgb at 7.5 (has been variable from 8.0 - 9.4 this admission) with platelets at 502 this AM.   TEE w/ Propofol scheduled for 03/08/2021 at approximately 10:00 AM. Will need to be NPO after midnight in preparation for the procedure.   Erma Heritage, PA-C  03/07/2021 9:34 AM

## 2021-03-07 NOTE — Plan of Care (Signed)

## 2021-03-07 NOTE — Procedures (Signed)
Interventional Radiology Procedure Note  Procedure: US guided biopsy of left kidney. Medical renal bx Complications: None EBL: None Recommendations: - Supine Bedrest at least 2 hours.  Ambulate per primary order at [facility].  - Routine wound care - Follow up pathology - Advance diet per primary order  - Stable now for transport  Signed,  Corrie Mckusick, DO

## 2021-03-07 NOTE — TOC Initial Note (Signed)
Transition of Care [facility]) - Initial/Assessment Note    Patient Details  Name: Diamond [name] MRN: [id number] Date of Birth: 28-Aug-1996  Transition of Care [facility]) CM/SW Contact:    Salome Arnt, [place] Phone Number: 03/07/2021, 2:24 PM  Clinical Narrative:  LCSW completed assessment due to high risk readmission score. Pt admitted with multifocal pneumonia and staph aureus bacteremia. She reports she lives with her aunt. Pt works part-time at [employer]. She is followed by the community paramedic program and said they assisted her in applying for Medicaid. Pt states she goes to [facility] for depression/anxiety. Discussed possibility of IV antibiotics at home. Pt believes her aunt would assist if needed. LCSW will notify Pam with [facility] and will continue to follow.                  Expected Discharge Plan: [place] Barriers to Discharge: Continued Medical Work up   Patient Goals and CMS Choice Patient states their goals for this hospitalization and ongoing recovery are:: return home   Choice offered to / list presented to : Patient  Expected Discharge Plan and Services Expected Discharge Plan: [place] In-house Referral: Clinical Social Work     Living arrangements for the past 2 months: Single Family Home                                      Prior Living Arrangements/Services Living arrangements for the past 2 months: Single Family Home Lives with:: Relatives Patient language and need for interpreter reviewed:: Yes Do you feel safe going back to the place where you live?: Yes            Criminal Activity/Legal Involvement Pertinent to Current Situation/Hospitalization: No - Comment as needed  Activities of Daily Living Home Assistive Devices/Equipment: Cane (specify quad or straight),CBG Meter ADL Screening (condition at time of admission) Patient's cognitive ability adequate to safely complete daily  activities?: Yes Is the patient deaf or have difficulty hearing?: No Does the patient have difficulty seeing, even when wearing glasses/contacts?: No Does the patient have difficulty concentrating, remembering, or making decisions?: No Patient able to express need for assistance with ADLs?: Yes Does the patient have difficulty dressing or bathing?: No Independently performs ADLs?: Yes (appropriate for developmental age) Does the patient have difficulty walking or climbing stairs?: Yes Weakness of Legs: Both Weakness of Arms/Hands: None  Permission Sought/Granted                  Emotional Assessment   Attitude/Demeanor/Rapport: Engaged Affect (typically observed): Accepting Orientation: : Oriented to Self,Oriented to Place,Oriented to  Time,Oriented to Situation Alcohol / Substance Use: Not Applicable Psych Involvement: Outpatient Provider  Admission diagnosis:  Anasarca [R60.1] Nephrotic syndrome [N04.9] Patient Active Problem List   Diagnosis Date Noted  . Multifocal pneumonia 03/05/2021  . Bladder outlet obstruction   . Constipation   . Abdominal pain, epigastric   . Abnormal computed tomography of cecum and terminal ileum   . Hyperosmolar hyperglycemic state (HHS) ([place]) 01/30/2021  . Crohn's disease ([place]) 12/08/2020  . Gastroparesis 12/08/2020  . Essential hypertension, benign 11/01/2020  . Edema 10/19/2020  . Anasarca 10/18/2020  . Hypoalbuminemia 10/18/2020  . Personal history of noncompliance with medical treatment, presenting hazards to health 10/12/2020  . Bilateral lower extremity edema 08/25/2020  . Pneumonia 08/25/2020  . Eczema 10/23/2012  . Abdominal  pain 08/27/2012  . GERD (gastroesophageal reflux disease) 07/25/2012  . Mood disorder ([place]) 07/09/2012  . Contraception management 07/09/2012  . History of Crohn's disease 07/09/2012  . Uncontrolled type 1 diabetes mellitus with hyperglycemia ([place]) 07/08/2012  . Asthma, persistent controlled 07/08/2012    PCP:  [name], PA-C Pharmacy:   [facility], [place], [place] [address], [place] [address] Phone: [phone] Fax: [phone]  [place], [place] [address] Phone: [phone] Fax: [phone]     Social Determinants of Health (SDOH) Interventions    Readmission Risk Interventions Readmission Risk Prevention Plan 03/07/2021 01/27/2021  Transportation Screening Complete Complete  PCP or Specialist Appt within 3-5 Days - Complete  HRI or [place] - Complete  Social Work Consult for [place] Planning/Counseling - Complete  Palliative Care Screening - Not Applicable  Medication Review Press photographer) Complete Complete  HRI or Home Care Consult Complete -  SW Recovery Care/Counseling Consult Complete -  Palliative Care Screening Not Applicable -  [place] Not Applicable -  Some recent data might be hidden

## 2021-03-07 NOTE — Progress Notes (Signed)
Admit: 03/02/2021 LOS: 5  52F DM1 since 25 admit with recurrent anasarca, weight gain, and DOE with pneumonia, nephrotic syndrome.  MSSA bacteremia  Subjective:  [facility]land Kitchen MRI neg for abscess, ? Fascial plan infection . RCID following, daptomycin changed to vancomycin this AM . TTE neg, TEE recommended . For renal Bx this AM with IR at [facility] . Hb 7.5 from 8.0, no overt losses noted . SCr up to 1.57 . UOP not quantified, numermous unmeasured voids, weight down 6kg, edema much improved . 24h U P pending, historical UP/C 3.95.   04/04 0701 - 04/05 0700 In: 1060 [P.O.:960; IV Piggyback:100] Out: 400 [Urine:400]   Filed Weights   03/02/21 1906 03/03/21 0504 03/06/21 1727  Weight: 64 kg 63.8 kg 60.3 kg    Scheduled Meds: . acetaminophen  650 mg Oral Q6H  . dextrose  25 mL Intravenous Once  . enoxaparin (LOVENOX) injection  40 mg Subcutaneous Q24H  . furosemide  40 mg Oral BID  . insulin aspart  0-9 Units Subcutaneous TID WC  . insulin aspart  4 Units Subcutaneous TID WC  . insulin glargine  14 Units Subcutaneous Daily  . lisinopril  20 mg Oral Daily  . pantoprazole  40 mg Oral Daily  . potassium chloride  10 mEq Oral Daily  . tamsulosin  0.4 mg Oral Daily   Continuous Infusions: PRN Meds:.albuterol, hydrALAZINE, ondansetron (ZOFRAN) IV, oxyCODONE, polyethylene glycol, prochlorperazine  Current Labs: reviewed, serologies negative   Physical Exam:  Blood pressure 127/83, pulse 98, temperature 97.6 F (36.4 C), temperature source Oral, resp. rate 16, height 4' 11"  (1.499 m), weight 60.3 kg, SpO2 100 %. Young female NAD Regular, normal rate, nl s1s2 Diminished in bases, no crackles, nl WOB, no wheezing S/nt/nd trace+ LEE Nonfocal NCAT EOMI  A 1. Nephrotic Syndrome: anasarca, hypoalbuminemia in context of #2; negative serologies; ? DKD vs another glomerular process 2. Mild AKI, likely diuretic related 3. DM1, not always well controlled, since 25 4. HTN on ACEi, lasix,  stable 5. ? CAP, per TRH 6. Anemia drifting down, TSAT 8%, s/p IV Fe 03/03/21; likely acute illness driven + IDA 7. MSSA bacteremia, neg TTE, ? Fascial plan infection of lower back, vanc, RCID following  P . Tentative renal Bx today, but with MSSA bacteremia and ? Fascial plan infection will d/w IR to make sure that this is feasible . paper biopsy form in paper chart . Change from IV to PO lasix today, vol status much improved . HTN, cont ACEi at current dosing . Daily weights, Daily Renal Panel, Strict I/Os, Avoid nephrotoxins (NSAIDs, judicious IV Contrast)   Pearson Grippe MD 03/07/2021, 8:53 AM  Recent Labs  Lab 03/05/21 0431 03/06/21 0625 03/07/21 0446  NA 136 134* 134*  K 4.4 4.2 4.8  CL 98 97* 96*  CO2 27 28 28   GLUCOSE 117* 184* 349*  BUN 15 13 15   CREATININE 1.08* 1.08* 1.57*  CALCIUM 8.0* 8.0* 8.2*  PHOS 5.1* 4.9* 5.0*   Recent Labs  Lab 03/02/21 1419 03/03/21 0436 03/05/21 0431 03/06/21 0625 03/07/21 0446  WBC 12.1*   < > 13.7* 12.8* 11.7*  NEUTROABS 8.6*  --   --   --   --   HGB 9.4*   < > 8.2* 8.0* 7.5*  HCT 29.0*   < > 26.2* 26.2* 24.7*  MCV 89.5   < > 92.9 92.6 93.6  PLT 559*   < > 524* 495* 502*   < > = values in this  interval not displayed.

## 2021-03-07 NOTE — Progress Notes (Signed)
PROGRESS NOTE   SHEINA MCLEISH  KZL:[id number] DOB: Dec 20, 1995 DOA: 03/02/2021 PCP: [name], PA-C   Chief Complaint  Patient presents with  . Weight Gain   Level of care: Med-Surg  Brief Admission History:  25 y.o. female, with past medical history of uncontrolled type 1 diabetes mellitus, with multiple admissions in the past for HHS, and hyperglycemia, anasarca, hypertension, she presents to ED due to significant weight gain recently discharged from [facility] 4 days ago, she reports 30 pounds weight gain since, reports her weight was 115 pounds on discharge, report today's weight 145, reports she has been compliant with low-salt diet, Lasix, she denies any fever, chills, chest pain, patient cardiac echo in 2021 with preserved EF 65 to 70%, with mild left ventricular hypertrophy, her BNP within normal limit, she does report some shortness of breath, intermittent cough, abdominal distention and decreased renal pain and whole body aching. - in ED, creatinine at 0.92, her albumin 1.9, LFTs within normal limit, she is with known anemia hemoglobin of 9.4, BNP normal at 89, chest x-ray with no evidence of volume overload, patient received 80 mg of IV Lasix and [facility] hospitalist consulted to admit.  Assessment & Plan:   Principal Problem:   Multifocal pneumonia Active Problems:   Uncontrolled type 1 diabetes mellitus with hyperglycemia (HCC)   GERD (gastroesophageal reflux disease)   Bilateral lower extremity edema   Anasarca   Essential hypertension, benign   Crohn's disease ([place])   Multifocal pneumonia - Pt developed fever, cough and sinus tach. CXR confirms pneumonia.  Reports multiple allergies. Continue abx coverage.   Follow up blood cultures x 2.  V/Q scan was low probability for PE.     Staph aureus bacteremia - Pt denies any history of IV drug use or recreational drugs.  Pt agreeable to a trial of cefazolin IV under monitored conditions, if she tolerates, we  plan to continue this,  repeat blood cultures, MRI L spine with no source of infection seen.  Dr. Harl Bowie plans to do TEE on 03/08/21.    Anasarca - Improving.  Pt being evaluated for nephrotic syndrome, following 24 hour urine and appreciate nephrology consultation.  Continue lasix per nephrology.  Pt now back from renal biopsy done at [facility] on 4/5.   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  Added HS snack. Reduced lantus 14 units daily with prandial NovoLog 4 units 3 times daily with meals.  Monitor CBG 5 times per day as needed.  Supplemental sliding scale coverage ordered as well.  Proteinuria-24-hour urine per renal team, continue lisinopril daily per nephrology.  Renal biopsy done on 4/5.    Poorly controlled hypertension-blood pressures better controlled with increased dose of lisinopril ordered by nephrology.  Normocytic anemia-appreciate nephrology consult and recommendations for IV iron infusion which has been given on 03/03/21.  Leukocytosis-secondary to pneumonia.  Check blood cultures x2.  Follow CBC with differential.  Fever-Tylenol ordered as needed.  Sinus tachycardia-likely from fever, negative venous Dopplers of the lower extremity and V/Q scan low probability of PE.   Chronic leg back and knee pain - MRI of L spine with contrast ordered for further evaluation.   Constipation - Continue miralax as needed.     DVT prophylaxis: Enoxaparin Code Status: Full Family Communication: Updated patient at bedside and verbalized understanding, spoke with aunt 03/05/21 at patient request.  Disposition: Home when medically stable Status is: Inpatient  Remains inpatient appropriate because:IV treatments  appropriate due to intensity of illness or inability to take PO and Inpatient level of care appropriate due to severity of illness  Dispo: The patient is from: Home              Anticipated d/c is to: Home              Patient  currently is not medically stable to d/c.   Difficult to place patient No  Consultants:   nephrology  Procedures:     Antimicrobials:    Subjective: Pt c/o pain in back and requesting more pain medication.     Objective: Vitals:   03/06/21 1727 03/06/21 2019 03/07/21 0416 03/07/21 1042  BP:  (!) 95/57 127/83 132/87  Pulse:  96 98 (!) 114  Resp:  16 16 18   Temp:  99 F (37.2 C) 97.6 F (36.4 C)   TempSrc:   Oral   SpO2:  100% 100% 97%  Weight: 60.3 kg     Height:        Intake/Output Summary (Last 24 hours) at 03/07/2021 1623 Last data filed at 03/07/2021 1500 Gross per 24 hour  Intake 580 ml  Output 400 ml  Net 180 ml   Filed Weights   03/02/21 1906 03/03/21 0504 03/06/21 1727  Weight: 64 kg 63.8 kg 60.3 kg    Examination:  General exam: chronically ill appearing, Appears calm and comfortable  Respiratory system: Clear to auscultation. Respiratory effort normal. Cardiovascular system: normal S1 & S2 heard. No JVD, murmurs, rubs, gallops or clicks. No pedal edema. Gastrointestinal system: Abdomen is nondistended, soft and nontender. No organomegaly or masses felt. Normal bowel sounds heard. Central nervous system: Alert and oriented. No focal neurological deficits. Extremities: no s/s of knee swelling or pain with exam, full ROM, no deformities of knees or back. Symmetric 5 x 5 power. Skin: No rashes, lesions or ulcers Psychiatry: Judgement and insight appear poor. Mood & affect flat.   Data Reviewed: I have personally reviewed following labs and imaging studies  CBC: Recent Labs  Lab 03/02/21 1419 03/03/21 0436 03/04/21 0542 03/05/21 0431 03/06/21 0625 03/07/21 0446  WBC 12.1* 12.6* 11.2* 13.7* 12.8* 11.7*  NEUTROABS 8.6*  --   --   --   --   --   HGB 9.4* 8.2* 8.6* 8.2* 8.0* 7.5*  HCT 29.0* 25.5* 26.7* 26.2* 26.2* 24.7*  MCV 89.5 90.4 90.8 92.9 92.6 93.6  PLT 559* 480* 474* 524* 495* 502*    Basic Metabolic Panel: Recent Labs  Lab 03/02/21 1419  03/03/21 0436 03/04/21 0542 03/05/21 0431 03/06/21 0625 03/07/21 0446  NA 130* 134* 135 136 134* 134*  K 4.1 3.9 3.9 4.4 4.2 4.8  CL 98 100 99 98 97* 96*  CO2 24 26 27 27 28 28   GLUCOSE 413* 235* 123* 117* 184* 349*  BUN 18 16 17 15 13 15   CREATININE 0.92 1.00 1.04* 1.08* 1.08* 1.57*  CALCIUM 8.6* 8.1* 8.3* 8.0* 8.0* 8.2*  MG 2.1  --   --   --   --   --   PHOS  --   --  5.8* 5.1* 4.9* 5.0*    GFR: Estimated Creatinine Clearance: 43.6 mL/min (A) (by C-G formula based on SCr of 1.57 mg/dL (H)).  Liver Function Tests: Recent Labs  Lab 03/02/21 1419 03/03/21 0436 03/04/21 0542 03/05/21 0431 03/06/21 0625 03/07/21 0446  AST 11* 13*  --   --   --   --   ALT 11 11  --   --   --   --  ALKPHOS 96 78  --   --   --   --   BILITOT 0.6 0.3  --   --   --   --   PROT 7.2 5.8*  --   --   --   --   ALBUMIN 1.9* 1.4* 1.5* 1.8* 1.5* 1.7*    CBG: Recent Labs  Lab 03/06/21 1623 03/06/21 2015 03/07/21 0240 03/07/21 0725 03/07/21 1218  GLUCAP 276* 270* 291* 309* 148*    Recent Results (from the past 240 hour(s))  Resp Panel by RT-PCR (Flu A&B, Covid) Nasopharyngeal Swab     Status: None   Collection Time: 03/02/21  3:32 PM   Specimen: Nasopharyngeal Swab; Nasopharyngeal(NP) swabs in vial transport medium  Result Value Ref Range Status   SARS Coronavirus 2 by RT PCR NEGATIVE NEGATIVE Final    Comment: (NOTE) SARS-CoV-2 target nucleic acids are NOT DETECTED.  The SARS-CoV-2 RNA is generally detectable in upper respiratory specimens during the acute phase of infection. The lowest concentration of SARS-CoV-2 viral copies this assay can detect is 138 copies/mL. A negative result does not preclude SARS-Cov-2 infection and should not be used as the sole basis for treatment or other patient management decisions. A negative result may occur with  improper specimen collection/handling, submission of specimen other than nasopharyngeal swab, presence of viral mutation(s) within  the areas targeted by this assay, and inadequate number of viral copies(<138 copies/mL). A negative result must be combined with clinical observations, patient history, and epidemiological information. The expected result is Negative.  Fact Sheet for Patients:  EntrepreneurPulse.com.au  Fact Sheet for Healthcare Providers:  IncredibleEmployment.be  This test is no t yet approved or cleared by the [place] FDA and  has been authorized for detection and/or diagnosis of SARS-CoV-2 by FDA under an Emergency Use Authorization (EUA). This EUA will remain  in effect (meaning this test can be used) for the duration of the COVID-19 declaration under Section 564(b)(1) of the Act, 21 U.S.C.section 360bbb-3(b)(1), unless the authorization is terminated  or revoked sooner.       Influenza A by PCR NEGATIVE NEGATIVE Final   Influenza B by PCR NEGATIVE NEGATIVE Final    Comment: (NOTE) The Xpert Xpress SARS-CoV-2/FLU/RSV plus assay is intended as an aid in the diagnosis of influenza from Nasopharyngeal swab specimens and should not be used as a sole basis for treatment. Nasal washings and aspirates are unacceptable for Xpert Xpress SARS-CoV-2/FLU/RSV testing.  Fact Sheet for Patients: EntrepreneurPulse.com.au  Fact Sheet for Healthcare Providers: IncredibleEmployment.be  This test is not yet approved or cleared by the [place] FDA and has been authorized for detection and/or diagnosis of SARS-CoV-2 by FDA under an Emergency Use Authorization (EUA). This EUA will remain in effect (meaning this test can be used) for the duration of the COVID-19 declaration under Section 564(b)(1) of the Act, 21 U.S.C. section 360bbb-3(b)(1), unless the authorization is terminated or revoked.  Performed at [facility], [address] [place], [place] [address]   Culture, blood (Routine X 2) w Reflex to ID Panel     Status:  None (Preliminary result)   Collection Time: 03/03/21  4:48 PM   Specimen: BLOOD LEFT HAND  Result Value Ref Range Status   Specimen Description BLOOD LEFT HAND  Final   Special Requests   Final    Blood Culture adequate volume BOTTLES DRAWN AEROBIC AND ANAEROBIC   Culture   Final    NO GROWTH 4 DAYS Performed at [facility],  [address] [place], [place] [address]    Report Status PENDING  Incomplete  Culture, blood (Routine X 2) w Reflex to ID Panel     Status: None (Preliminary result)   Collection Time: 03/03/21  4:54 PM   Specimen: Left Antecubital; Blood  Result Value Ref Range Status   Specimen Description LEFT ANTECUBITAL  Final   Special Requests   Final    Blood Culture adequate volume BOTTLES DRAWN AEROBIC AND ANAEROBIC   Culture   Final    NO GROWTH 4 DAYS Performed at [facility], [address], [place], [place] [address]    Report Status PENDING  Incomplete  Culture, blood (routine x 2)     Status: Abnormal (Preliminary result)   Collection Time: 03/05/21  4:31 AM   Specimen: Left Antecubital; Blood  Result Value Ref Range Status   Specimen Description   Final    LEFT ANTECUBITAL BOTTLES DRAWN AEROBIC AND ANAEROBIC Performed at [facility], [address], [place], [place] [address]    Special Requests   Final    Blood Culture adequate volume Performed at [facility], [address], [place], [place] [address]    Culture  Setup Time   Final    AEROBIC BOTTLE GRAM POSITIVE COCCI IN CLUSTERS CRITICAL RESULT CALLED TO, READ BACK BY AND VERIFIED WITH: MOODY,N [id number] 03/05/2021 COLEMAN,R ANAEROBIC BOTTLE GRAM POSITIVE COCCI IN CLUSTERS RESULTS PREVIOUSLY CALLED Organism ID to follow CRITICAL RESULT CALLED TO, READ BACK BY AND VERIFIED WITH:    Culture (A)  Final    STAPHYLOCOCCUS AUREUS SUSCEPTIBILITIES TO FOLLOW Performed at [facility], [place] [address] [place], [place] [address]    Report Status PENDING  Incomplete  Culture, blood (routine x 2)      Status: None (Preliminary result)   Collection Time: 03/05/21  4:31 AM   Specimen: BLOOD LEFT HAND  Result Value Ref Range Status   Specimen Description   Final    BLOOD LEFT HAND BOTTLES DRAWN AEROBIC AND ANAEROBIC   Special Requests Blood Culture adequate volume  Final   Culture   Final    NO GROWTH 2 DAYS Performed at [facility], [address] [place], [place] [address]    Report Status PENDING  Incomplete  Blood Culture ID Panel (Reflexed)     Status: Abnormal   Collection Time: 03/05/21  4:31 AM  Result Value Ref Range Status   Enterococcus faecalis NOT DETECTED NOT DETECTED Final   Enterococcus Faecium NOT DETECTED NOT DETECTED Final   Listeria monocytogenes NOT DETECTED NOT DETECTED Final   Staphylococcus species DETECTED (A) NOT DETECTED Final    Comment: CRITICAL RESULT CALLED TO, READ BACK BY AND VERIFIED WITH: RN CANDICE ROSE BY MESSAN H. AT 0332 ON 03/06/2021    Staphylococcus aureus (BCID) DETECTED (A) NOT DETECTED Final    Comment: CRITICAL RESULT CALLED TO, READ BACK BY AND VERIFIED WITH: RN CANDICE ROSE BY MESSAN H. AT 0332 ON 03/06/2021    Staphylococcus epidermidis NOT DETECTED NOT DETECTED Final   Staphylococcus lugdunensis NOT DETECTED NOT DETECTED Final   Streptococcus species NOT DETECTED NOT DETECTED Final   Streptococcus agalactiae NOT DETECTED NOT DETECTED Final   Streptococcus pneumoniae NOT DETECTED NOT DETECTED Final   Streptococcus pyogenes NOT DETECTED NOT DETECTED Final   A.calcoaceticus-baumannii NOT DETECTED NOT DETECTED Final   Bacteroides fragilis NOT DETECTED NOT DETECTED Final   Enterobacterales NOT DETECTED NOT DETECTED Final   Enterobacter cloacae complex NOT DETECTED NOT DETECTED Final  Escherichia coli NOT DETECTED NOT DETECTED Final   Klebsiella aerogenes NOT DETECTED NOT DETECTED Final   Klebsiella oxytoca NOT DETECTED NOT DETECTED Final   Klebsiella pneumoniae NOT DETECTED NOT DETECTED Final   Proteus species NOT DETECTED NOT  DETECTED Final   Salmonella species NOT DETECTED NOT DETECTED Final   Serratia marcescens NOT DETECTED NOT DETECTED Final   Haemophilus influenzae NOT DETECTED NOT DETECTED Final   Neisseria meningitidis NOT DETECTED NOT DETECTED Final   Pseudomonas aeruginosa NOT DETECTED NOT DETECTED Final   Stenotrophomonas maltophilia NOT DETECTED NOT DETECTED Final   Candida albicans NOT DETECTED NOT DETECTED Final   Candida auris NOT DETECTED NOT DETECTED Final   Candida glabrata NOT DETECTED NOT DETECTED Final   Candida krusei NOT DETECTED NOT DETECTED Final   Candida parapsilosis NOT DETECTED NOT DETECTED Final   Candida tropicalis NOT DETECTED NOT DETECTED Final   Cryptococcus neoformans/gattii NOT DETECTED NOT DETECTED Final   Meth resistant mecA/C and MREJ NOT DETECTED NOT DETECTED Final    Comment: Performed at [facility], [place] [address] [place], [place] [address]  Culture, blood (Routine X 2) w Reflex to ID Panel     Status: None (Preliminary result)   Collection Time: 03/07/21  4:46 AM   Specimen: Left Antecubital; Blood  Result Value Ref Range Status   Specimen Description LEFT ANTECUBITAL  Final   Special Requests   Final    BOTTLES DRAWN AEROBIC AND ANAEROBIC Blood Culture adequate volume   Culture   Final    NO GROWTH <12 HOURS Performed at [facility], [address] [place], [place] [address]    Report Status PENDING  Incomplete  Culture, blood (Routine X 2) w Reflex to ID Panel     Status: None (Preliminary result)   Collection Time: 03/07/21  4:46 AM   Specimen: BLOOD LEFT HAND  Result Value Ref Range Status   Specimen Description BLOOD LEFT HAND  Final   Special Requests   Final    BOTTLES DRAWN AEROBIC AND ANAEROBIC Blood Culture adequate volume   Culture   Final    NO GROWTH <12 HOURS Performed at [facility], [address] [place],  [address]    Report Status PENDING  Incomplete     Radiology Studies: MR Lumbar Spine W Wo Contrast  Result Date:  03/06/2021 CLINICAL DATA:  Staph bacteremia. Low back pain. Bilateral leg weakness. EXAM: MRI LUMBAR SPINE WITHOUT AND WITH CONTRAST TECHNIQUE: Multiplanar and multiecho pulse sequences of the lumbar spine were obtained without and with intravenous contrast. CONTRAST:  1m GADAVIST GADOBUTROL 1 MMOL/ML IV SOLN COMPARISON:  None. FINDINGS: Segmentation:  5 lumbar type vertebral bodies. Alignment:  Normal Vertebrae:  Normal.  No evidence of bone or joint infection. Conus medullaris and cauda equina: Conus extends to the L1 level. Conus and cauda equina appear normal. Paraspinal and other soft tissues: Deep para spinous soft tissues are normal. No evidence of epidural space infection. Nonspecific edema pattern/fluid accumulation within the fatty tissues of the back, superficial to the paraspinous muscles, extending from T12-S1. This is more pronounced than usually seen with simple dependent positioning and the patient could possibly have a fascial plane infection in the back. Disc levels: Mild bulging of the discs at L3-4, L4-5 and L5-S1 but no apparent compressive stenosis. IMPRESSION: 1. No evidence of epidural space infection. No evidence of spinal infection. 2. Nonspecific edema pattern within the fatty tissues of the back throughout the lumbar region, superficial to the  paraspinous muscles. This is more pronounced than usually seen with simple dependent positioning and the patient could possibly have a fascial plane infection in the back. Electronically Signed   By: Nelson Chimes M.D.   On: 03/06/2021 14:33   ECHOCARDIOGRAM COMPLETE  Result Date: 03/06/2021    ECHOCARDIOGRAM REPORT   Patient Name:   Diamond [name] Date of Exam: 03/06/2021 Medical Rec #:  [id number]        Height:       59.0 in Accession #:    [id number]       Weight:       140.7 lb Date of Birth:  [birth year]        BSA:          1.588 m Patient Age:    24 years         BP:           134/96 mmHg Patient Gender: F                HR:           96  bpm. Exam Location:  [facility] Procedure: 2D Echo Indications:    Bacteremia R78.81  History:        Patient has prior history of Echocardiogram examinations, most                 recent 10/19/2020. Risk Factors:Non-Smoker, Diabetes and                 Hypertension. GERD, Pneumonia.  Sonographer:    Leavy Cella RDCS (AE) Referring Phys: [place]  1. Moderate LVH, myocardium with somewhat speckled appearance. Mild valve thickening. Trivial pericardial effusion. Findings could suggest infiltrative cardiac disease. Consider outpatient cardiac MRI for further evaluation. . Left ventricular ejection fraction, by estimation, is 65 to 70%. The left ventricle has normal function. The left ventricle has no regional wall motion abnormalities. There is moderate left ventricular hypertrophy. Left ventricular diastolic parameters were normal.  2. Right ventricular systolic function is normal. The right ventricular size is normal.  3. The pericardial effusion is circumferential.  4. The mitral valve is abnormal. Trivial mitral valve regurgitation. No evidence of mitral stenosis.  5. The aortic valve is tricuspid. There is mild thickening of the aortic valve. Aortic valve regurgitation is not visualized. No aortic stenosis is present.  6. The inferior vena cava is normal in size with greater than 50% respiratory variability, suggesting right atrial pressure of 3 mmHg. FINDINGS  Left Ventricle: Moderate LVH, myocardium with somewhat speckled appearance. Mild valve thickening. Trivial pericardial effusion. Findings could suggest infiltrative cardiac disease. Consider outpatient cardiac MRI for further evaluation. Left ventricular ejection fraction, by estimation, is 65 to 70%. The left ventricle has normal function. The left ventricle has no regional wall motion abnormalities. The left ventricular internal cavity size was normal in size. There is moderate left ventricular hypertrophy. Left  ventricular diastolic parameters were normal. Right Ventricle: The right ventricular size is normal. No increase in right ventricular wall thickness. Right ventricular systolic function is normal. Left Atrium: Left atrial size was normal in size. Right Atrium: Right atrial size was normal in size. Pericardium: Trivial pericardial effusion is present. The pericardial effusion is circumferential. Mitral Valve: The mitral valve is abnormal. There is mild thickening of the mitral valve leaflet(s). Trivial mitral valve regurgitation. No evidence of mitral valve stenosis. Tricuspid Valve: The tricuspid valve is normal in structure. Tricuspid valve regurgitation is  not demonstrated. No evidence of tricuspid stenosis. Aortic Valve: The aortic valve is tricuspid. There is mild thickening of the aortic valve. Aortic valve regurgitation is not visualized. No aortic stenosis is present. Aortic valve mean gradient measures 4.8 mmHg. Aortic valve peak gradient measures 8.7 mmHg. Aortic valve area, by VTI measures 2.07 cm. Pulmonic Valve: The pulmonic valve was not well visualized. Pulmonic valve regurgitation is not visualized. No evidence of pulmonic stenosis. Aorta: The aortic root is normal in size and structure. Pulmonary Artery: Indeterminant PASP, inadequate TR jet. Venous: The inferior vena cava is normal in size with greater than 50% respiratory variability, suggesting right atrial pressure of 3 mmHg. IAS/Shunts: No atrial level shunt detected by color flow Doppler.  LEFT VENTRICLE PLAX 2D LVIDd:         3.42 cm  Diastology LVIDs:         1.76 cm  LV e' medial:    12.50 cm/s LV PW:         1.40 cm  LV E/e' medial:  5.9 LV IVS:        1.35 cm  LV e' lateral:   13.80 cm/s LVOT diam:     1.70 cm  LV E/e' lateral: 5.4 LV SV:         56 LV SV Index:   35 LVOT Area:     2.27 cm  RIGHT VENTRICLE RV S prime:     19.20 cm/s TAPSE (M-mode): 2.9 cm LEFT ATRIUM             Index       RIGHT ATRIUM           Index LA diam:         3.20 cm 2.02 cm/m  RA Area:     10.40 cm LA Vol (A2C):   40.0 ml 25.19 ml/m RA Volume:   21.10 ml  13.29 ml/m LA Vol (A4C):   27.6 ml 17.38 ml/m LA Biplane Vol: 33.0 ml 20.78 ml/m  AORTIC VALVE AV Area (Vmax):    1.99 cm AV Area (Vmean):   2.07 cm AV Area (VTI):     2.07 cm AV Vmax:           147.27 cm/s AV Vmean:          104.500 cm/s AV VTI:            0.270 m AV Peak Grad:      8.7 mmHg AV Mean Grad:      4.8 mmHg LVOT Vmax:         129.02 cm/s LVOT Vmean:        95.318 cm/s LVOT VTI:          0.247 m LVOT/AV VTI ratio: 0.91  AORTA Ao Root diam: 2.00 cm MITRAL VALVE MV Area (PHT): 3.97 cm    SHUNTS MV Decel Time: 191 msec    Systemic VTI:  0.25 m MV E velocity: 74.00 cm/s  Systemic Diam: 1.70 cm MV A velocity: 71.30 cm/s MV E/A ratio:  1.04 [name] MD Electronically signed by [name] MD Signature Date/Time: 03/06/2021/10:32:44 AM    Final    US BIOPSY (KIDNEY)  Result Date: 03/07/2021 INDICATION: 25 year old female with a history of nephrotic syndrome EXAM: IMAGE GUIDED MEDICAL RENAL BIOPSY MEDICATIONS: None. ANESTHESIA/SEDATION: Moderate (conscious) sedation was employed during this procedure. A total of Versed 2.0 mg and Fentanyl 50 mcg was administered intravenously. Moderate Sedation Time: 13 minutes. The patient's level of consciousness  and vital signs were monitored continuously by radiology nursing throughout the procedure under my direct supervision. FLUOROSCOPY TIME:  Ultrasound COMPLICATIONS: None PROCEDURE: Informed written consent was obtained from the patient after a thorough discussion of the procedural risks, benefits and alternatives. All questions were addressed. Maximal Sterile Barrier Technique was utilized including caps, mask, sterile gowns, sterile gloves, sterile drape, hand hygiene and skin antiseptic. A timeout was performed prior to the initiation of the procedure. Patient was positioned prone position on the gantry table. Images were stored sent to PACs. Once  the patient is prepped and draped in the usual sterile fashion, the skin and subcutaneous tissues overlying the left kidney were generously infiltrated 1% lidocaine for local anesthesia. Using ultrasound guidance, a 15 gauge guide needle was advanced into the lower cortex of the left kidney. Once we confirmed location of the needle tip, 2 separate 16 gauge core biopsy were achieved. Two Gel-Foam pledgets were infused with a small amount of saline. The needle was removed. Final images were stored. The patient tolerated the procedure well and remained hemodynamically stable throughout. No complications were encountered and no significant blood loss encountered. IMPRESSION: Status post image guided medical renal biopsy of the left kidney. Signed, Dulcy Fanny. Dellia Nims, RPVI Vascular and [facility] Radiology Electronically Signed   By: Corrie Mckusick D.O.   On: 03/07/2021 12:38   Scheduled Meds: . acetaminophen  650 mg Oral Q6H  . dextrose  25 mL Intravenous Once  . enoxaparin (LOVENOX) injection  40 mg Subcutaneous Q24H  . furosemide  40 mg Oral BID  . insulin aspart  0-9 Units Subcutaneous TID WC  . insulin aspart  4 Units Subcutaneous TID WC  . insulin glargine  14 Units Subcutaneous Daily  . lisinopril  20 mg Oral Daily  . pantoprazole  40 mg Oral Daily  . potassium chloride  10 mEq Oral Daily  . tamsulosin  0.4 mg Oral Daily   Continuous Infusions: .  ceFAZolin (ANCEF) IV 2 g (03/07/21 1454)     LOS: 5 days   Time spent: 13 mins   Sherria Riemann Wynetta Emery, MD How to contact the [facility] Attending or Consulting provider [place] or covering provider during after hours [place], for this patient?  1. Check the care team in [facility] and look for a) attending/consulting TRH provider listed and b) the [facility] team listed 2. Log into [email or web address] and use 's universal password to access. If you do not have the password, please contact the hospital operator. 3. Locate the [facility]  provider you are looking for under [facility] and page to a number that you can be directly reached. 4. If you still have difficulty reaching the provider, please page the [facility] (Director on Call) for the Hospitalists listed on amion for assistance.  03/07/2021, 4:23 PM

## 2021-03-08 ENCOUNTER — Telehealth (HOSPITAL_COMMUNITY): Payer: Self-pay | Admitting: Physical Therapy

## 2021-03-08 ENCOUNTER — Inpatient Hospital Stay (HOSPITAL_COMMUNITY): Payer: Self-pay

## 2021-03-08 ENCOUNTER — Ambulatory Visit: Payer: Medicaid Other | Admitting: "Endocrinology

## 2021-03-08 ENCOUNTER — Ambulatory Visit (HOSPITAL_COMMUNITY): Payer: Self-pay | Admitting: Physical Therapy

## 2021-03-08 DIAGNOSIS — J189 Pneumonia, unspecified organism: Secondary | ICD-10-CM

## 2021-03-08 LAB — CULTURE, BLOOD (ROUTINE X 2)
Culture: NO GROWTH
Culture: NO GROWTH
Special Requests: ADEQUATE
Special Requests: ADEQUATE
Special Requests: ADEQUATE

## 2021-03-08 LAB — CBC
HCT: 24.1 % — ABNORMAL LOW (ref 36.0–46.0)
Hemoglobin: 7.5 g/dL — ABNORMAL LOW (ref 12.0–15.0)
MCH: 29 pg (ref 26.0–34.0)
MCHC: 31.1 g/dL (ref 30.0–36.0)
MCV: 93.1 fL (ref 80.0–100.0)
Platelets: 492 10*3/uL — ABNORMAL HIGH (ref 150–400)
RBC: 2.59 MIL/uL — ABNORMAL LOW (ref 3.87–5.11)
RDW: 13.1 % (ref 11.5–15.5)
WBC: 10.7 10*3/uL — ABNORMAL HIGH (ref 4.0–10.5)
nRBC: 0 % (ref 0.0–0.2)

## 2021-03-08 LAB — GLUCOSE, CAPILLARY
Glucose-Capillary: 342 mg/dL — ABNORMAL HIGH (ref 70–99)
Glucose-Capillary: 453 mg/dL — ABNORMAL HIGH (ref 70–99)
Glucose-Capillary: 534 mg/dL (ref 70–99)
Glucose-Capillary: 492 mg/dL — ABNORMAL HIGH (ref 70–99)
Glucose-Capillary: 382 mg/dL — ABNORMAL HIGH (ref 70–99)
Glucose-Capillary: 165 mg/dL — ABNORMAL HIGH (ref 70–99)
Glucose-Capillary: 121 mg/dL — ABNORMAL HIGH (ref 70–99)

## 2021-03-08 LAB — RENAL FUNCTION PANEL
Albumin: 1.8 g/dL — ABNORMAL LOW (ref 3.5–5.0)
Anion gap: 9 (ref 5–15)
BUN: 25 mg/dL — ABNORMAL HIGH (ref 6–20)
CO2: 27 mmol/L (ref 22–32)
Calcium: 8.2 mg/dL — ABNORMAL LOW (ref 8.9–10.3)
Chloride: 94 mmol/L — ABNORMAL LOW (ref 98–111)
Creatinine, Ser: 2.16 mg/dL — ABNORMAL HIGH (ref 0.44–1.00)
GFR, Estimated: 32 mL/min — ABNORMAL LOW (ref >60)
Glucose, Bld: 591 mg/dL (ref 70–99)
Phosphorus: 5.4 mg/dL — ABNORMAL HIGH (ref 2.5–4.6)
Potassium: 5.4 mmol/L — ABNORMAL HIGH (ref 3.5–5.1)
Sodium: 130 mmol/L — ABNORMAL LOW (ref 135–145)

## 2021-03-08 MED ORDER — INSULIN GLARGINE 100 UNIT/ML ~~LOC~~ SOLN
16.0000 [IU] | Freq: Every day | SUBCUTANEOUS | Status: DC
  Administered 2021-03-09 – 2021-03-10 (×2): 16 [IU] via SUBCUTANEOUS
  Filled 2021-03-08 (×5): qty 0.16

## 2021-03-08 MED ORDER — SODIUM ZIRCONIUM CYCLOSILICATE 10 G PO PACK
10.0000 g | PACK | Freq: Once | ORAL | Status: DC
  Filled 2021-03-08: qty 1

## 2021-03-08 MED ORDER — HYDROXYZINE HCL 25 MG PO TABS
25.0000 mg | ORAL_TABLET | Freq: Three times a day (TID) | ORAL | Status: DC | PRN
  Administered 2021-03-08: 25 mg via ORAL
  Filled 2021-03-08: qty 1

## 2021-03-08 MED ORDER — INSULIN ASPART 100 UNIT/ML ~~LOC~~ SOLN
9.0000 [IU] | Freq: Once | SUBCUTANEOUS | Status: AC
  Administered 2021-03-08: 9 [IU] via SUBCUTANEOUS

## 2021-03-08 MED ORDER — ALBUMIN HUMAN 25 % IV SOLN
25.0000 g | Freq: Four times a day (QID) | INTRAVENOUS | Status: AC
  Administered 2021-03-08 (×3): 25 g via INTRAVENOUS
  Filled 2021-03-08 (×3): qty 100

## 2021-03-08 NOTE — Telephone Encounter (Signed)
Pt returned call after therapist left message regarding missed appointment.  Pt states she was admitted into the hospital a week ago and is having surgery tomorrow.  Request all further appt be cancelled and she would call when she can return.   Teena Irani, PTA/CLT [phone]

## 2021-03-08 NOTE — Progress Notes (Signed)
      INFECTIOUS DISEASE ATTENDING ADDENDUM:   Date: 03/08/2021  Patient name: Diamond [name]  Medical record number: [id number]  Date of birth: November 19, 1996   25 year old lady with poorly controlled diabetes mellitus admitted with severe low back pain found to have evidence of multifocal pneumonia and growing MSSA from blood.  2D echocardiogram did not show vegetations MRI of the spine was without infection.  She has been successfully changed over cefazolin without adverse reaction.  She is going to have a transesophageal echocardiogram tomorrow.  We will check sed rate and CRP  Certainly if her TEE is clean but her inflammatory markers are up I would favor erring on the side of treating her for presumptive discitis that is just not showing up on MRI yet.  Have discussed today with Dr. Denton Brick and will followup tomorrow.   [name] 03/08/2021, 2:43 PM

## 2021-03-08 NOTE — Progress Notes (Signed)
Date and time results received: 03/08/21 6:36 AM  (use smartphrase ".now" to insert current time)  Test: glucose Critical Value: 591  Name of Provider Notified: hospitalist  Orders Received? Or Actions Taken?: Actions Taken: give 0800 ssi now

## 2021-03-08 NOTE — Progress Notes (Signed)
PROGRESS NOTE   Diamond [name]  OAC:[id number] DOB: Mar 28, 1996 DOA: 03/02/2021 PCP: [name], PA-C   Chief Complaint  Patient presents with  . Weight Gain   Level of care: Med-Surg  Brief Admission History:  24 y.o. female, with past medical history of uncontrolled type 1 diabetes mellitus, with multiple admissions in the past for HHS, and hyperglycemia, anasarca, hypertension, she presents to ED due to significant weight gain recently discharged from [facility] 4 days ago, she reports 30 pounds weight gain since, reports her weight was 115 pounds on discharge, report today's weight 145, reports she has been compliant with low-salt diet, Lasix, she denies any fever, chills, chest pain, patient cardiac echo in 2021 with preserved EF 65 to 70%, with mild left ventricular hypertrophy, her BNP within normal limit, she does report some shortness of breath, intermittent cough, abdominal distention and decreased renal pain and whole body aching. - in ED, creatinine at 0.92, her albumin 1.9, LFTs within normal limit, she is with known anemia hemoglobin of 9.4, BNP normal at 89, chest x-ray with no evidence of volume overload, patient received 80 mg of IV Lasix and [facility] hospitalist consulted to admit.  Assessment & Plan:   Principal Problem:   Multifocal pneumonia Active Problems:   Uncontrolled type 1 diabetes mellitus with hyperglycemia (HCC)   GERD (gastroesophageal reflux disease)   Bilateral lower extremity edema   Anasarca   Essential hypertension, benign   Crohn's disease ([place])     1)Staph Aureus Multifocal pneumonia and Sepsis- -patient met sepsis criteria on admission -Chest x-ray consistent with pneumonia -  V/Q scan was low probability for PE -Sepsis pathophysiology resolving -Leukocytosis improving, -Fever resolved, tachycardia improving Discussed with ID Physician Dr Alvino [place]--most likely will need up to 6 weeks of Abx in therapy -Continue IV  Ancef, does not appear to have frank allergy  2)Staph aureus bacteremia and sepsis- Pt denies any history of IV drug use or recreational drugs -Patient appears to have 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 -ID consult and IV Ancef as above #1 -  repeat blood cultures, MRI L spine with no source of infection seen.  Dr. Harl Bowie plans to do TEE on 03/09/21.   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  8)Chronic leg back and knee pain - MRI of L spine with contrast without acute findings -Check ESR and CRP  9)Constipation - Continue miralax as needed.     DVT prophylaxis: Enoxaparin Code Status: Full Family Communication: No family at bedside  Disposition: Home when medically stable Status is: Inpatient  Remains inpatient appropriate  because:Staph bacteremia, sepsis, pneumonia requiring IV antibiotics pending further work-up including TEE  Dispo: The patient is from: Home              Anticipated d/c is to: Home               Patient currently is not medically stable to d/c.   Difficult to place patient No  Consultants:   Nephrology/ID   Procedures:   TEE 03/09/21  Antimicrobials:    Subjective: -Complains of slight itchiness, hydroxyzine ordered No fevers, no chills, no vomiting, -Cough and shortness of breath is improved  Objective: Vitals:   03/07/21 2052 03/08/21 0455 03/08/21 0500 03/08/21 1330  BP: 117/71 115/73  109/63  Pulse: (!) 110 98  95  Resp: 18 18  17   Temp: 98.2 F (36.8 C) 98 F (36.7 C)  98 F (36.7 C)  TempSrc:  Oral  Oral  SpO2: 100% 100%  100%  Weight:   60.7 kg   Height:        Intake/Output Summary (Last 24 hours) at 03/08/2021 1717 Last data filed at 03/08/2021 1358 Gross per 24 hour  Intake 560 ml  Output --  Net 560 ml   Filed Weights   03/03/21 0504 03/06/21 1727 03/08/21 0500  Weight: 63.8 kg 60.3 kg 60.7 kg    Examination:  General exam: chronically ill appearing, no acute distress  respiratory system: Fair symmetrical air movement, no wheezing. Cardiovascular system: normal S1 & S2 heard.  At least 2+ to 3+ pitting edema  Gastrointestinal system: Abdomen is nondistended, soft and nontender. . Normal bowel sounds heard. Central nervous system: Alert and oriented. No focal neurological deficits. Extremities: Good pedal pulses Skin: No rashes, lesions or ulcers Psychiatry: Judgement and insight appear poor. Mood & affect appropriate  Data Reviewed: I have personally reviewed following labs and imaging studies  CBC: Recent Labs  Lab 03/02/21 1419 03/03/21 0436 03/04/21 0542 03/05/21 0431 03/06/21 0625 03/07/21 0446 03/08/21 0520  WBC 12.1*   < > 11.2* 13.7* 12.8* 11.7* 10.7*  NEUTROABS 8.6*  --   --   --   --   --   --   HGB 9.4*   < > 8.6* 8.2* 8.0* 7.5* 7.5*  HCT 29.0*   < > 26.7* 26.2* 26.2* 24.7* 24.1*  MCV 89.5   < > 90.8 92.9 92.6 93.6 93.1  PLT 559*   < > 474* 524* 495* 502* 492*   < > = values in this interval not displayed.     Basic Metabolic Panel: Recent Labs  Lab 03/02/21 1419 03/03/21 0436 03/04/21 0542 03/05/21 0431 03/06/21 0625 03/07/21 0446 03/08/21 0520  NA 130*   < > 135 136 134* 134* 130*  K 4.1   < > 3.9 4.4 4.2 4.8 5.4*  CL 98   < > 99 98 97* 96* 94*  CO2 24   < > 27 27 28 28 27   GLUCOSE 413*   < > 123* 117* 184* 349* 591*  BUN 18   < > 17 15 13 15  25*  CREATININE 0.92   < > 1.04* 1.08* 1.08* 1.57* 2.16*  CALCIUM 8.6*   < > 8.3* 8.0* 8.0* 8.2* 8.2*  MG 2.1  --   --   --   --   --   --   PHOS  --   --  5.8* 5.1* 4.9* 5.0* 5.4*   < > = values in this interval not displayed.  GFR: Estimated Creatinine Clearance: 31.8 mL/min (A) (by C-G formula based on SCr of 2.16 mg/dL (H)).  Liver Function Tests: Recent Labs  Lab 03/02/21 1419 03/03/21 0436 03/04/21 0542 03/05/21 0431 03/06/21 0625 03/07/21 0446 03/08/21 0520  AST 11* 13*  --   --   --   --   --   ALT 11 11  --   --   --   --   --   ALKPHOS 96 78  --   --   --   --   --   BILITOT 0.6 0.3  --   --   --   --   --   PROT 7.2 5.8*  --   --   --   --   --   ALBUMIN 1.9* 1.4* 1.5* 1.8* 1.5* 1.7* 1.8*    CBG: Recent Labs  Lab 03/08/21 0224 03/08/21 0745 03/08/21 0907 03/08/21 1109 03/08/21 1620  GLUCAP 453* 534* 492* 382* 165*    Recent Results (from the past 240 hour(s))  Resp Panel by RT-PCR (Flu A&B, Covid) Nasopharyngeal Swab     Status: None   Collection Time: 03/02/21  3:32 PM   Specimen: Nasopharyngeal Swab; Nasopharyngeal(NP) swabs in vial transport medium  Result Value Ref Range Status   SARS Coronavirus 2 by RT PCR NEGATIVE NEGATIVE Final    Comment: (NOTE) SARS-CoV-2 target nucleic acids are NOT DETECTED.  The SARS-CoV-2 RNA is generally detectable in upper respiratory specimens during the acute phase of infection. The lowest concentration of SARS-CoV-2 viral copies this assay can detect is 138 copies/mL. A negative result does not preclude SARS-Cov-2 infection and should not be used as the sole  basis for treatment or other patient management decisions. A negative result may occur with  improper specimen collection/handling, submission of specimen other than nasopharyngeal swab, presence of viral mutation(s) within the areas targeted by this assay, and inadequate number of viral copies(<138 copies/mL). A negative result must be combined with clinical observations, patient history, and epidemiological information. The expected result is Negative.  Fact Sheet for Patients:  EntrepreneurPulse.com.au  Fact Sheet for Healthcare Providers:  IncredibleEmployment.be  This test is no t yet approved or cleared by the [place] FDA and  has been authorized for detection and/or diagnosis of SARS-CoV-2 by FDA under an Emergency Use Authorization (EUA). This EUA will remain  in effect (meaning this test can be used) for the duration of the COVID-19 declaration under Section 564(b)(1) of the Act, 21 U.S.C.section 360bbb-3(b)(1), unless the authorization is terminated  or revoked sooner.       Influenza A by PCR NEGATIVE NEGATIVE Final   Influenza B by PCR NEGATIVE NEGATIVE Final    Comment: (NOTE) The Xpert Xpress SARS-CoV-2/FLU/RSV plus assay is intended as an aid in the diagnosis of influenza from Nasopharyngeal swab specimens and should not be used as a sole basis for treatment. Nasal washings and aspirates are unacceptable for Xpert Xpress SARS-CoV-2/FLU/RSV testing.  Fact Sheet for Patients: EntrepreneurPulse.com.au  Fact Sheet for Healthcare Providers: IncredibleEmployment.be  This test is not yet approved or cleared by the [place] FDA and has been authorized for detection and/or diagnosis of SARS-CoV-2 by FDA under an Emergency Use Authorization (EUA). This EUA will remain in effect (meaning this test can be used) for the duration of the COVID-19 declaration under Section 564(b)(1) of the Act,  21 U.S.C. section 360bbb-3(b)(1), unless the authorization is terminated or revoked.  Performed at [facility], [address] [place],  [place] [address]   Culture, blood (Routine X 2) w Reflex to ID Panel     Status: None   Collection Time: 03/03/21  4:48 PM   Specimen: BLOOD LEFT HAND  Result Value Ref Range Status   Specimen Description BLOOD LEFT HAND  Final   Special Requests   Final    Blood Culture adequate volume BOTTLES DRAWN AEROBIC AND ANAEROBIC   Culture   Final    NO GROWTH 5 DAYS Performed at [facility], [address] [place], [place] [address]    Report Status 03/08/2021 FINAL  Final  Culture, blood (Routine X 2) w Reflex to ID Panel     Status: None   Collection Time: 03/03/21  4:54 PM   Specimen: Left Antecubital; Blood  Result Value Ref Range Status   Specimen Description LEFT ANTECUBITAL  Final   Special Requests   Final    Blood Culture adequate volume BOTTLES DRAWN AEROBIC AND ANAEROBIC   Culture   Final    NO GROWTH 5 DAYS Performed at [facility], [address] [place], [place] [address]    Report Status 03/08/2021 FINAL  Final  Culture, blood (routine x 2)     Status: Abnormal   Collection Time: 03/05/21  4:31 AM   Specimen: Left Antecubital; Blood  Result Value Ref Range Status   Specimen Description   Final    LEFT ANTECUBITAL BOTTLES DRAWN AEROBIC AND ANAEROBIC Performed at [facility], [address] [place], [place] [address]    Special Requests   Final    Blood Culture adequate volume Performed at [facility], [address] [place], [place] [address]    Culture  Setup Time   Final    AEROBIC BOTTLE GRAM POSITIVE COCCI IN CLUSTERS CRITICAL RESULT CALLED TO, READ BACK BY AND VERIFIED WITH: MOODY,N 2215 03/05/2021 COLEMAN,R ANAEROBIC BOTTLE GRAM POSITIVE COCCI IN CLUSTERS RESULTS PREVIOUSLY CALLED Organism ID to follow CRITICAL RESULT CALLED TO, READ BACK BY AND VERIFIED WITH: Performed at [facility], [place] [address], [place], [place] [address]    Culture STAPHYLOCOCCUS AUREUS (A)  Final   Report Status 03/08/2021 FINAL  Final   Organism ID, Bacteria STAPHYLOCOCCUS AUREUS  Final      Susceptibility   Staphylococcus aureus - MIC*    CIPROFLOXACIN <=0.5 SENSITIVE Sensitive     ERYTHROMYCIN RESISTANT Resistant     GENTAMICIN <=0.5 SENSITIVE Sensitive     OXACILLIN <=0.25 SENSITIVE Sensitive     TETRACYCLINE <=1 SENSITIVE Sensitive     VANCOMYCIN <=0.5 SENSITIVE Sensitive     TRIMETH/SULFA <=10 SENSITIVE Sensitive     CLINDAMYCIN RESISTANT Resistant     RIFAMPIN <=0.5 SENSITIVE Sensitive     Inducible Clindamycin POSITIVE Resistant     * STAPHYLOCOCCUS AUREUS  Culture, blood (routine x 2)     Status: None (Preliminary result)   Collection Time: 03/05/21  4:31 AM   Specimen: BLOOD LEFT HAND  Result Value Ref Range Status   Specimen Description   Final    BLOOD LEFT HAND BOTTLES DRAWN AEROBIC AND ANAEROBIC   Special Requests Blood Culture adequate volume  Final   Culture   Final    NO GROWTH 3 DAYS Performed at [facility], [address] [place], [place] [address]    Report Status PENDING  Incomplete  Blood Culture ID Panel (Reflexed)     Status: Abnormal   Collection Time: 03/05/21  4:31 AM  Result Value Ref Range Status  Enterococcus faecalis NOT DETECTED NOT DETECTED Final   Enterococcus Faecium NOT DETECTED NOT DETECTED Final   Listeria monocytogenes NOT DETECTED NOT DETECTED Final   Staphylococcus species DETECTED (A) NOT DETECTED Final    Comment: CRITICAL RESULT CALLED TO, READ BACK BY AND VERIFIED WITH: RN CANDICE ROSE BY MESSAN H. AT 0332 ON 03/06/2021    Staphylococcus aureus (BCID) DETECTED (A) NOT DETECTED Final    Comment: CRITICAL RESULT CALLED TO, READ BACK BY AND VERIFIED WITH: RN CANDICE ROSE BY MESSAN H. AT 0332 ON 03/06/2021    Staphylococcus epidermidis NOT DETECTED NOT DETECTED Final   Staphylococcus lugdunensis NOT DETECTED NOT DETECTED Final   Streptococcus species NOT  DETECTED NOT DETECTED Final   Streptococcus agalactiae NOT DETECTED NOT DETECTED Final   Streptococcus pneumoniae NOT DETECTED NOT DETECTED Final   Streptococcus pyogenes NOT DETECTED NOT DETECTED Final   A.calcoaceticus-baumannii NOT DETECTED NOT DETECTED Final   Bacteroides fragilis NOT DETECTED NOT DETECTED Final   Enterobacterales NOT DETECTED NOT DETECTED Final   Enterobacter cloacae complex NOT DETECTED NOT DETECTED Final   Escherichia coli NOT DETECTED NOT DETECTED Final   Klebsiella aerogenes NOT DETECTED NOT DETECTED Final   Klebsiella oxytoca NOT DETECTED NOT DETECTED Final   Klebsiella pneumoniae NOT DETECTED NOT DETECTED Final   Proteus species NOT DETECTED NOT DETECTED Final   Salmonella species NOT DETECTED NOT DETECTED Final   Serratia marcescens NOT DETECTED NOT DETECTED Final   Haemophilus influenzae NOT DETECTED NOT DETECTED Final   Neisseria meningitidis NOT DETECTED NOT DETECTED Final   Pseudomonas aeruginosa NOT DETECTED NOT DETECTED Final   Stenotrophomonas maltophilia NOT DETECTED NOT DETECTED Final   Candida albicans NOT DETECTED NOT DETECTED Final   Candida auris NOT DETECTED NOT DETECTED Final   Candida glabrata NOT DETECTED NOT DETECTED Final   Candida krusei NOT DETECTED NOT DETECTED Final   Candida parapsilosis NOT DETECTED NOT DETECTED Final   Candida tropicalis NOT DETECTED NOT DETECTED Final   Cryptococcus neoformans/gattii NOT DETECTED NOT DETECTED Final   Meth resistant mecA/C and MREJ NOT DETECTED NOT DETECTED Final    Comment: Performed at [facility], [address] [place], [place] [address]  Culture, blood (Routine X 2) w Reflex to ID Panel     Status: None (Preliminary result)   Collection Time: 03/07/21  4:46 AM   Specimen: Left Antecubital; Blood  Result Value Ref Range Status   Specimen Description LEFT ANTECUBITAL  Final   Special Requests   Final    BOTTLES DRAWN AEROBIC AND ANAEROBIC Blood Culture adequate volume   Culture    Final    NO GROWTH 1 DAY Performed at [facility], [address], [place], [place] [address]    Report Status PENDING  Incomplete  Culture, blood (Routine X 2) w Reflex to ID Panel     Status: None (Preliminary result)   Collection Time: 03/07/21  4:46 AM   Specimen: BLOOD LEFT HAND  Result Value Ref Range Status   Specimen Description BLOOD LEFT HAND  Final   Special Requests   Final    BOTTLES DRAWN AEROBIC AND ANAEROBIC Blood Culture adequate volume   Culture   Final    NO GROWTH 1 DAY Performed at [facility], [address], [place], [place] [address]    Report Status PENDING  Incomplete     Radiology Studies: US BIOPSY (KIDNEY)  Result Date: 03/07/2021 INDICATION: 25 year old female with a history of nephrotic syndrome EXAM: IMAGE GUIDED MEDICAL RENAL  BIOPSY MEDICATIONS: None. ANESTHESIA/SEDATION: Moderate (conscious) sedation was employed during this procedure. A total of Versed 2.0 mg and Fentanyl 50 mcg was administered intravenously. Moderate Sedation Time: 13 minutes. The patient's level of consciousness and vital signs were monitored continuously by radiology nursing throughout the procedure under my direct supervision. FLUOROSCOPY TIME:  Ultrasound COMPLICATIONS: None PROCEDURE: Informed written consent was obtained from the patient after a thorough discussion of the procedural risks, benefits and alternatives. All questions were addressed. Maximal Sterile Barrier Technique was utilized including caps, mask, sterile gowns, sterile gloves, sterile drape, hand hygiene and skin antiseptic. A timeout was performed prior to the initiation of the procedure. Patient was positioned prone position on the gantry table. Images were stored sent to PACs. Once the patient is prepped and draped in the usual sterile fashion, the skin and subcutaneous tissues overlying the left kidney were generously infiltrated 1% lidocaine for local anesthesia. Using ultrasound guidance, a 15 gauge guide needle  was advanced into the lower cortex of the left kidney. Once we confirmed location of the needle tip, 2 separate 16 gauge core biopsy were achieved. Two Gel-Foam pledgets were infused with a small amount of saline. The needle was removed. Final images were stored. The patient tolerated the procedure well and remained hemodynamically stable throughout. No complications were encountered and no significant blood loss encountered. IMPRESSION: Status post image guided medical renal biopsy of the left kidney. Signed, Dulcy Fanny. Dellia Nims, RPVI [facility] Radiology Electronically Signed   By: Corrie Mckusick D.O.   On: 03/07/2021 12:38   Scheduled Meds: . acetaminophen  650 mg Oral Q6H  . dextrose  25 mL Intravenous Once  . enoxaparin (LOVENOX) injection  40 mg Subcutaneous Q24H  . insulin aspart  0-9 Units Subcutaneous TID WC  . insulin aspart  4 Units Subcutaneous TID WC  . [START ON 03/09/2021] insulin glargine  16 Units Subcutaneous Daily  . pantoprazole  40 mg Oral Daily  . sodium zirconium cyclosilicate  10 g Oral Once  . tamsulosin  0.4 mg Oral Daily   Continuous Infusions: . albumin human 25 g (03/08/21 1609)  .  ceFAZolin (ANCEF) IV 2 g (03/08/21 1332)     LOS: 6 days   Roxan Hockey, MD How to contact the [facility] Attending or Consulting provider [place] or covering provider during after hours [place], for this patient?  1. Check the care team in [facility] and look for a) attending/consulting TRH provider listed and b) the [facility] team listed 2. Log into [email or web address] and use [place]'s universal password to access. If you do not have the password, please contact the hospital operator. 3. Locate the [facility] provider you are looking for under [facility] and page to a number that you can be directly reached. 4. If you still have difficulty reaching the provider, please page the [facility] (Director on Call) for the Hospitalists listed on amion for  assistance.  03/08/2021, 5:17 PM

## 2021-03-08 NOTE — Progress Notes (Signed)
Inpatient Diabetes Program Recommendations  AACE/ADA: New Consensus Statement on Inpatient Glycemic Control (2015)  Target Ranges:  Prepandial:   less than 140 mg/dL      Peak postprandial:   less than 180 mg/dL (1-2 hours)      Critically ill patients:  140 - 180 mg/dL   Lab Results  Component Value Date   GLUCAP 492 (H) 03/08/2021   HGBA1C 5.9 (H) 03/02/2021    Review of Glycemic Control Results for Diamond [name], Diamond [name] (MRN [id number]) as of 03/08/2021 09:33  Ref. Range 03/07/2021 16:48 03/07/2021 20:53 03/08/2021 02:24 03/08/2021 07:45 03/08/2021 09:07  Glucose-Capillary Latest Ref Range: 70 - 99 mg/dL 331 (H) 342 (H) 453 (H) 534 (HH) 492 (H)   Diabetes history: DM 1 Outpatient Diabetes medications:  Lantus 15 units q HS, Humalog 10-16 units tid with meals Current orders for Inpatient glycemic control:  Novolog 0-9 units tid with meals, Novolog 4 units tid with meals, Lantus 14 units daily Inpatient Diabetes Program Recommendations:   Consider changing Lantus to 10 units bid.   Thanks,  Adah Perl, RN, BC-ADM Inpatient Diabetes Coordinator Pager [phone] (8a-5p)

## 2021-03-08 NOTE — Progress Notes (Signed)
Discussed with anesthesia patient's significant hyperglycemia in type I DM patient, will postpone TEE to tomorrow   [name] MD

## 2021-03-08 NOTE — Progress Notes (Addendum)
[facility] NEPHROLOGY PROGRESS NOTE  Assessment/ Plan: Pt is a 25 y.o. yo female with hypertension, Crohn's disease, uncontrolled type 1 diabetes with multiple prior admission for HHS, hyperglycemia, admitted with significant weight gain, anasarca, MSSA bacteremia.  #Nephrotic syndrome with anasarca likely diabetic nephropathy: UA with 3.95 g proteinuria without microscopic hematuria.  Status post IR guided kidney biopsy on 4/5, await biopsy result.  Serologies including ANA, ANCA, dsDNA, ASO, anti-GBM, complement unremarkable.  The lower extremity edema has improved and with AKI, I will discontinue Lasix.  Order IV albumin because of severe hypoalbuminemia and to augment renal perfusion.  Strict ins and out and avoid nephrotoxins.  #Acute kidney injury likely hemodynamically mediated with diuretics, lisinopril, hyperglycemia and reduced renal perfusion.  I will hold lisinopril and Lasix.  IV albumin as above.  Daily BMP.  #MSSA bacteremia, pneumonia: Seen by ID.  Now on cefazolin.  Plan for TEE today by cardiologist.  WBC trending down.  #Mild hyperkalemia due to KCl, lisinopril and hyperglycemia.  Both were discontinued.  Order a dose of Lokelma.  #Type I uncontrolled diabetes with hyperglycemia: Blood sugar level 591 today.  Insulin and DM management per primary team.  # Anemia of acute illness: Received a dose of iron on 4/1, monitor hemoglobin and transfuse as needed.  # HTN/volume: Blood pressure acceptable.  Lisinopril and Lasix held as above.  Monitor BP.  Subjective: Seen and examined at bedside.  Patient is lying on bed comfortable.  No urine output recorded.  Discussed about the strict ins and out with the patient and nurse.  Going for TEE.  Denies nausea, vomiting, chest pain, shortness of breath.  She feels tired.  Objective Vital signs in last 24 hours: Vitals:   03/07/21 1042 03/07/21 2052 03/08/21 0455 03/08/21 0500  BP: 132/87 117/71 115/73   Pulse: (!) 114  (!) 110 98   Resp: 18 18 18    Temp:  98.2 F (36.8 C) 98 F (36.7 C)   TempSrc:   Oral   SpO2: 97% 100% 100%   Weight:    60.7 kg  Height:       Weight change: 0.372 kg  Intake/Output Summary (Last 24 hours) at 03/08/2021 0920 Last data filed at 03/07/2021 1500 Gross per 24 hour  Intake 0 ml  Output --  Net 0 ml       Labs: Basic Metabolic Panel: Recent Labs  Lab 03/06/21 0625 03/07/21 0446 03/08/21 0520  NA 134* 134* 130*  K 4.2 4.8 5.4*  CL 97* 96* 94*  CO2 28 28 27   GLUCOSE 184* 349* 591*  BUN 13 15 25*  CREATININE 1.08* 1.57* 2.16*  CALCIUM 8.0* 8.2* 8.2*  PHOS 4.9* 5.0* 5.4*   Liver Function Tests: Recent Labs  Lab 03/02/21 1419 03/03/21 0436 03/04/21 0542 03/06/21 0625 03/07/21 0446 03/08/21 0520  AST 11* 13*  --   --   --   --   ALT 11 11  --   --   --   --   ALKPHOS 96 78  --   --   --   --   BILITOT 0.6 0.3  --   --   --   --   PROT 7.2 5.8*  --   --   --   --   ALBUMIN 1.9* 1.4*   < > 1.5* 1.7* 1.8*   < > = values in this interval not displayed.   No results for input(s): LIPASE, AMYLASE in the last 168  hours. No results for input(s): AMMONIA in the last 168 hours. CBC: Recent Labs  Lab 03/02/21 1419 03/03/21 0436 03/04/21 0542 03/05/21 0431 03/06/21 0625 03/07/21 0446 03/08/21 0520  WBC 12.1*   < > 11.2* 13.7* 12.8* 11.7* 10.7*  NEUTROABS 8.6*  --   --   --   --   --   --   HGB 9.4*   < > 8.6* 8.2* 8.0* 7.5* 7.5*  HCT 29.0*   < > 26.7* 26.2* 26.2* 24.7* 24.1*  MCV 89.5   < > 90.8 92.9 92.6 93.6 93.1  PLT 559*   < > 474* 524* 495* 502* 492*   < > = values in this interval not displayed.   Cardiac Enzymes: Recent Labs  Lab 03/06/21 0925  CKTOTAL 52   CBG: Recent Labs  Lab 03/07/21 1648 03/07/21 2053 03/08/21 0224 03/08/21 0745 03/08/21 0907  GLUCAP 331* 342* 453* 534* 492*    Iron Studies: No results for input(s): IRON, TIBC, TRANSFERRIN, FERRITIN in the last 72 hours. Studies/Results: MR Lumbar Spine W Wo  Contrast  Result Date: 03/06/2021 CLINICAL DATA:  Staph bacteremia. Low back pain. Bilateral leg weakness. EXAM: MRI LUMBAR SPINE WITHOUT AND WITH CONTRAST TECHNIQUE: Multiplanar and multiecho pulse sequences of the lumbar spine were obtained without and with intravenous contrast. CONTRAST:  57m GADAVIST GADOBUTROL 1 MMOL/ML IV SOLN COMPARISON:  None. FINDINGS: Segmentation:  5 lumbar type vertebral bodies. Alignment:  Normal Vertebrae:  Normal.  No evidence of bone or joint infection. Conus medullaris and cauda equina: Conus extends to the L1 level. Conus and cauda equina appear normal. Paraspinal and other soft tissues: Deep para spinous soft tissues are normal. No evidence of epidural space infection. Nonspecific edema pattern/fluid accumulation within the fatty tissues of the back, superficial to the paraspinous muscles, extending from T12-S1. This is more pronounced than usually seen with simple dependent positioning and the patient could possibly have a fascial plane infection in the back. Disc levels: Mild bulging of the discs at L3-4, L4-5 and L5-S1 but no apparent compressive stenosis. IMPRESSION: 1. No evidence of epidural space infection. No evidence of spinal infection. 2. Nonspecific edema pattern within the fatty tissues of the back throughout the lumbar region, superficial to the paraspinous muscles. This is more pronounced than usually seen with simple dependent positioning and the patient could possibly have a fascial plane infection in the back. Electronically Signed   By: MNelson ChimesM.D.   On: 03/06/2021 14:33   ECHOCARDIOGRAM COMPLETE  Result Date: 03/06/2021    ECHOCARDIOGRAM REPORT   Patient Name:   BFERNE ELLINGWOODDate of Exam: 03/06/2021 Medical Rec #:  [id number]       Height:       59.0 in Accession #:    [id number]      Weight:       140.7 lb Date of Birth:  [birth year]       BSA:          1.588 m Patient Age:    24 years         BP:           134/96 mmHg Patient Gender: F                 HR:           96 bpm. [facility]am [facility]  [facility]cedure: 2D Echo Indications:    Bacteremia R78.81  History:  Patient has prior history of Echocardiogram examinations, most                 recent 10/19/2020. Risk Factors:Non-Smoker, Diabetes and                 Hypertension. GERD, Pneumonia.  Sonographer:    Leavy Cella RDCS (AE) Referring Phys: [place]  1. Moderate LVH, myocardium with somewhat speckled appearance. Mild valve thickening. Trivial pericardial effusion. Findings could suggest infiltrative cardiac disease. Consider outpatient cardiac MRI for further evaluation. . Left ventricular ejection fraction, by estimation, is 65 to 70%. The left ventricle has normal function. The left ventricle has no regional wall motion abnormalities. There is moderate left ventricular hypertrophy. Left ventricular diastolic parameters were normal.  2. Right ventricular systolic function is normal. The right ventricular size is normal.  3. The pericardial effusion is circumferential.  4. The mitral valve is abnormal. Trivial mitral valve regurgitation. No evidence of mitral stenosis.  5. The aortic valve is tricuspid. There is mild thickening of the aortic valve. Aortic valve regurgitation is not visualized. No aortic stenosis is present.  6. The inferior vena cava is normal in size with greater than 50% respiratory variability, suggesting right atrial pressure of 3 mmHg. FINDINGS  Left Ventricle: Moderate LVH, myocardium with somewhat speckled appearance. Mild valve thickening. Trivial pericardial effusion. Findings could suggest infiltrative cardiac disease. Consider outpatient cardiac MRI for further evaluation. Left ventricular ejection fraction, by estimation, is 65 to 70%. The left ventricle has normal function. The left ventricle has no regional wall motion abnormalities. The left ventricular internal cavity size was normal in size. There is moderate left ventricular  hypertrophy. Left ventricular diastolic parameters were normal. Right Ventricle: The right ventricular size is normal. No increase in right ventricular wall thickness. Right ventricular systolic function is normal. Left Atrium: Left atrial size was normal in size. Right Atrium: Right atrial size was normal in size. Pericardium: Trivial pericardial effusion is present. The pericardial effusion is circumferential. Mitral Valve: The mitral valve is abnormal. There is mild thickening of the mitral valve leaflet(s). Trivial mitral valve regurgitation. No evidence of mitral valve stenosis. Tricuspid Valve: The tricuspid valve is normal in structure. Tricuspid valve regurgitation is not demonstrated. No evidence of tricuspid stenosis. Aortic Valve: The aortic valve is tricuspid. There is mild thickening of the aortic valve. Aortic valve regurgitation is not visualized. No aortic stenosis is present. Aortic valve mean gradient measures 4.8 mmHg. Aortic valve peak gradient measures 8.7 mmHg. Aortic valve area, by VTI measures 2.07 cm. Pulmonic Valve: The pulmonic valve was not well visualized. Pulmonic valve regurgitation is not visualized. No evidence of pulmonic stenosis. Aorta: The aortic root is normal in size and structure. Pulmonary Artery: Indeterminant PASP, inadequate TR jet. Venous: The inferior vena cava is normal in size with greater than 50% respiratory variability, suggesting right atrial pressure of 3 mmHg. IAS/Shunts: No atrial level shunt detected by color flow Doppler.  LEFT VENTRICLE PLAX 2D LVIDd:         3.42 cm  Diastology LVIDs:         1.76 cm  LV e' medial:    12.50 cm/s LV PW:         1.40 cm  LV E/e' medial:  5.9 LV IVS:        1.35 cm  LV e' lateral:   13.80 cm/s LVOT diam:     1.70 cm  LV E/e' lateral: 5.4 LV SV:  56 LV SV Index:   35 LVOT Area:     2.27 cm  RIGHT VENTRICLE RV S prime:     19.20 cm/s TAPSE (M-mode): 2.9 cm LEFT ATRIUM             Index       RIGHT ATRIUM           Index  LA diam:        3.20 cm 2.02 cm/m  RA Area:     10.40 cm LA Vol (A2C):   40.0 ml 25.19 ml/m RA Volume:   21.10 ml  13.29 ml/m LA Vol (A4C):   27.6 ml 17.38 ml/m LA Biplane Vol: 33.0 ml 20.78 ml/m  AORTIC VALVE AV Area (Vmax):    1.99 cm AV Area (Vmean):   2.07 cm AV Area (VTI):     2.07 cm AV Vmax:           147.27 cm/s AV Vmean:          104.500 cm/s AV VTI:            0.270 m AV Peak Grad:      8.7 mmHg AV Mean Grad:      4.8 mmHg LVOT Vmax:         129.02 cm/s LVOT Vmean:        95.318 cm/s LVOT VTI:          0.247 m LVOT/AV VTI ratio: 0.91  AORTA Ao Root diam: 2.00 cm MITRAL VALVE MV Area (PHT): 3.97 cm    SHUNTS MV Decel Time: 191 msec    Systemic VTI:  0.25 m MV E velocity: 74.00 cm/s  Systemic Diam: 1.70 cm MV A velocity: 71.30 cm/s MV E/A ratio:  1.04 [name] MD Electronically signed by [name] MD Signature Date/Time: 03/06/2021/10:32:44 AM    Final    US BIOPSY (KIDNEY)  Result Date: 03/07/2021 INDICATION: 25 year old female with a history of nephrotic syndrome EXAM: IMAGE GUIDED MEDICAL RENAL BIOPSY MEDICATIONS: None. ANESTHESIA/SEDATION: Moderate (conscious) sedation was employed during this procedure. A total of Versed 2.0 mg and Fentanyl 50 mcg was administered intravenously. Moderate Sedation Time: 13 minutes. The patient's level of consciousness and vital signs were monitored continuously by radiology nursing throughout the procedure under my direct supervision. FLUOROSCOPY TIME:  Ultrasound COMPLICATIONS: None PROCEDURE: Informed written consent was obtained from the patient after a thorough discussion of the procedural risks, benefits and alternatives. All questions were addressed. Maximal Sterile Barrier Technique was utilized including caps, mask, sterile gowns, sterile gloves, sterile drape, hand hygiene and skin antiseptic. A timeout was performed prior to the initiation of the procedure. Patient was positioned prone position on the gantry table. Images were stored  sent to PACs. Once the patient is prepped and draped in the usual sterile fashion, the skin and subcutaneous tissues overlying the left kidney were generously infiltrated 1% lidocaine for local anesthesia. Using ultrasound guidance, a 15 gauge guide needle was advanced into the lower cortex of the left kidney. Once we confirmed location of the needle tip, 2 separate 16 gauge core biopsy were achieved. Two Gel-Foam pledgets were infused with a small amount of saline. The needle was removed. Final images were stored. The patient tolerated the procedure well and remained hemodynamically stable throughout. No complications were encountered and no significant blood loss encountered. IMPRESSION: Status post image guided medical renal biopsy of the left kidney. Signed, Dulcy Fanny. Dellia Nims, [facility] Radiology Electronically  Signed   By: Corrie Mckusick D.O.   On: 03/07/2021 12:38    Medications: Infusions: . albumin human    .  ceFAZolin (ANCEF) IV 2 g (03/08/21 0455)    Scheduled Medications: . acetaminophen  650 mg Oral Q6H  . dextrose  25 mL Intravenous Once  . enoxaparin (LOVENOX) injection  40 mg Subcutaneous Q24H  . insulin aspart  0-9 Units Subcutaneous TID WC  . insulin aspart  4 Units Subcutaneous TID WC  . insulin glargine  14 Units Subcutaneous Daily  . pantoprazole  40 mg Oral Daily  . sodium zirconium cyclosilicate  10 g Oral Once  . tamsulosin  0.4 mg Oral Daily    have reviewed scheduled and prn medications.  Physical Exam: General:NAD, comfortable Heart:RRR, s1s2 nl Lungs:clear b/l, no crackle Abdomen:soft, Non-tender, non-distended Extremities: Only trace pedal edema Skin: No rash  Tykeria Wawrzyniak Tanna Furry 03/08/2021,9:20 AM  LOS: 6 days

## 2021-03-08 NOTE — Progress Notes (Signed)
Pt refused to drink the Sodium zirconium cyclosilicate ( lokelma) 88T medication in water. She was educated that it had to be in water and not juice. Pt still refused to drink it and said " that its nasty and I can only drink it with juice". MD notified

## 2021-03-08 NOTE — Progress Notes (Signed)
Pt CBG-  534 MD, courgage notified No new orders at this time.

## 2021-03-08 NOTE — Telephone Encounter (Signed)
Called patient about missed appointment today, no answer. Left VM and reminder of next visit scheduled 4/11.   10:18 AM, 03/08/21 Josue Hector PT DPT  Physical Therapist with [facility]  [phone]

## 2021-03-09 ENCOUNTER — Inpatient Hospital Stay (HOSPITAL_COMMUNITY): Payer: Self-pay

## 2021-03-09 ENCOUNTER — Encounter (HOSPITAL_COMMUNITY)
Admission: EM | Disposition: A | Discharge status: Home Health | Payer: Self-pay | Source: Home / Self Care | Attending: Family Medicine

## 2021-03-09 ENCOUNTER — Inpatient Hospital Stay: Payer: Self-pay

## 2021-03-09 ENCOUNTER — Inpatient Hospital Stay (HOSPITAL_COMMUNITY): Payer: Self-pay | Admitting: Anesthesiology

## 2021-03-09 ENCOUNTER — Encounter (HOSPITAL_COMMUNITY): Payer: Self-pay | Admitting: Internal Medicine

## 2021-03-09 DIAGNOSIS — I361 Nonrheumatic tricuspid (valve) insufficiency: Secondary | ICD-10-CM

## 2021-03-09 DIAGNOSIS — R7881 Bacteremia: Secondary | ICD-10-CM

## 2021-03-09 HISTORY — PX: TEE WITHOUT CARDIOVERSION: SHX5443

## 2021-03-09 LAB — RENAL FUNCTION PANEL
Albumin: 2.4 g/dL — ABNORMAL LOW (ref 3.5–5.0)
Anion gap: 9 (ref 5–15)
BUN: 22 mg/dL — ABNORMAL HIGH (ref 6–20)
CO2: 30 mmol/L (ref 22–32)
Calcium: 8.9 mg/dL (ref 8.9–10.3)
Chloride: 100 mmol/L (ref 98–111)
Creatinine, Ser: 1.5 mg/dL — ABNORMAL HIGH (ref 0.44–1.00)
GFR, Estimated: 50 mL/min — ABNORMAL LOW (ref >60)
Glucose, Bld: 261 mg/dL — ABNORMAL HIGH (ref 70–99)
Phosphorus: 4.7 mg/dL — ABNORMAL HIGH (ref 2.5–4.6)
Potassium: 4.3 mmol/L (ref 3.5–5.1)
Sodium: 139 mmol/L (ref 135–145)

## 2021-03-09 LAB — CBC
HCT: 22.9 % — ABNORMAL LOW (ref 36.0–46.0)
Hemoglobin: 7.1 g/dL — ABNORMAL LOW (ref 12.0–15.0)
MCH: 28.6 pg (ref 26.0–34.0)
MCHC: 31 g/dL (ref 30.0–36.0)
MCV: 92.3 fL (ref 80.0–100.0)
Platelets: 457 10*3/uL — ABNORMAL HIGH (ref 150–400)
RBC: 2.48 MIL/uL — ABNORMAL LOW (ref 3.87–5.11)
RDW: 13.1 % (ref 11.5–15.5)
WBC: 8.6 10*3/uL (ref 4.0–10.5)
nRBC: 0 % (ref 0.0–0.2)

## 2021-03-09 LAB — GLUCOSE, CAPILLARY
Glucose-Capillary: 240 mg/dL — ABNORMAL HIGH (ref 70–99)
Glucose-Capillary: 257 mg/dL — ABNORMAL HIGH (ref 70–99)
Glucose-Capillary: 228 mg/dL — ABNORMAL HIGH (ref 70–99)
Glucose-Capillary: 165 mg/dL — ABNORMAL HIGH (ref 70–99)
Glucose-Capillary: 141 mg/dL — ABNORMAL HIGH (ref 70–99)
Glucose-Capillary: 331 mg/dL — ABNORMAL HIGH (ref 70–99)
Glucose-Capillary: 282 mg/dL — ABNORMAL HIGH (ref 70–99)

## 2021-03-09 LAB — SEDIMENTATION RATE: Sed Rate: 140 mm/hr — ABNORMAL HIGH (ref 0–22)

## 2021-03-09 LAB — C-REACTIVE PROTEIN: CRP: 4.8 mg/dL — ABNORMAL HIGH (ref <1.0)

## 2021-03-09 SURGERY — ECHOCARDIOGRAM, TRANSESOPHAGEAL
Anesthesia: General

## 2021-03-09 MED ORDER — SODIUM CHLORIDE BACTERIOSTATIC 0.9 % IJ SOLN
INTRAMUSCULAR | Status: AC
  Filled 2021-03-09: qty 10

## 2021-03-09 MED ORDER — BUTAMBEN-TETRACAINE-BENZOCAINE 2-2-14 % EX AERO
INHALATION_SPRAY | CUTANEOUS | Status: AC
  Filled 2021-03-09: qty 5

## 2021-03-09 MED ORDER — MIDAZOLAM HCL 2 MG/2ML IJ SOLN
INTRAMUSCULAR | Status: DC | PRN
  Administered 2021-03-09: 2 mg via INTRAVENOUS

## 2021-03-09 MED ORDER — PROPOFOL 10 MG/ML IV BOLUS
INTRAVENOUS | Status: AC
  Filled 2021-03-09: qty 40

## 2021-03-09 MED ORDER — DIPHENHYDRAMINE HCL 12.5 MG/5ML PO ELIX
25.0000 mg | ORAL_SOLUTION | Freq: Three times a day (TID) | ORAL | Status: DC | PRN
  Administered 2021-03-09: 25 mg via ORAL
  Filled 2021-03-09 (×2): qty 10

## 2021-03-09 MED ORDER — LIDOCAINE VISCOUS HCL 2 % MT SOLN
OROMUCOSAL | Status: DC | PRN
  Administered 2021-03-09: 10 mL via OROMUCOSAL

## 2021-03-09 MED ORDER — LABETALOL HCL 5 MG/ML IV SOLN
10.0000 mg | INTRAVENOUS | Status: DC | PRN
  Administered 2021-03-09: 10 mg via INTRAVENOUS
  Filled 2021-03-09: qty 4

## 2021-03-09 MED ORDER — LIDOCAINE HCL (CARDIAC) PF 100 MG/5ML IV SOSY
PREFILLED_SYRINGE | INTRAVENOUS | Status: DC | PRN
  Administered 2021-03-09: 30 mg via INTRAVENOUS

## 2021-03-09 MED ORDER — LIDOCAINE HCL (PF) 2 % IJ SOLN
INTRAMUSCULAR | Status: AC
  Filled 2021-03-09: qty 5

## 2021-03-09 MED ORDER — DIPHENHYDRAMINE HCL 12.5 MG/5ML PO ELIX
25.0000 mg | ORAL_SOLUTION | Freq: Four times a day (QID) | ORAL | Status: DC | PRN
  Administered 2021-03-10 (×2): 25 mg via ORAL
  Filled 2021-03-09: qty 10

## 2021-03-09 MED ORDER — LIDOCAINE VISCOUS HCL 2 % MT SOLN
OROMUCOSAL | Status: AC
  Filled 2021-03-09: qty 15

## 2021-03-09 MED ORDER — MIDAZOLAM HCL 2 MG/2ML IJ SOLN
INTRAMUSCULAR | Status: AC
  Filled 2021-03-09: qty 2

## 2021-03-09 MED ORDER — ISOSORBIDE MONONITRATE ER 60 MG PO TB24
30.0000 mg | ORAL_TABLET | Freq: Every day | ORAL | Status: DC
  Administered 2021-03-09 – 2021-03-10 (×2): 30 mg via ORAL
  Filled 2021-03-09: qty 1

## 2021-03-09 MED ORDER — LACTATED RINGERS IV SOLN
INTRAVENOUS | Status: DC
  Administered 2021-03-09: 1000 mL via INTRAVENOUS

## 2021-03-09 MED ORDER — HYDRALAZINE HCL 25 MG PO TABS
25.0000 mg | ORAL_TABLET | Freq: Three times a day (TID) | ORAL | Status: DC
  Administered 2021-03-09 – 2021-03-10 (×4): 25 mg via ORAL
  Filled 2021-03-09 (×5): qty 1

## 2021-03-09 MED ORDER — PROPOFOL 500 MG/50ML IV EMUL
INTRAVENOUS | Status: DC | PRN
  Administered 2021-03-09: 150 ug/kg/min via INTRAVENOUS

## 2021-03-09 NOTE — Progress Notes (Signed)
Refused all 0600 medications, doctor made aware.

## 2021-03-09 NOTE — Progress Notes (Signed)
PHARMACY CONSULT NOTE FOR:  OUTPATIENT  PARENTERAL ANTIBIOTIC THERAPY (OPAT):   Indication: Bacteremia, possible discitis Regimen: Cefazolin 2 gm IV Q 8 hrs End date: 04/17/21  IV antibiotic discharge orders are pended. To discharging provider:  please sign these orders via discharge navigator,  Select New Orders & click on the button choice - Manage This Unsigned Work.    Thank you for allowing pharmacy to be a part of this patient's care.  Gillermina Hu, PharmD, BCPS, [facility] Clinical Pharmacist 03/09/2021, 5:49 PM

## 2021-03-09 NOTE — TOC Progression Note (Signed)
Transition of Care [facility]) - Progression Note    Patient Details  Name: Diamond [name] MRN: [id number] Date of Birth: Aug 28, 1996  Transition of Care [facility]) CM/SW Contact  Shade Flood, LCSW Phone Number: 03/09/2021, 11:08 AM  Clinical Narrative:     TOC following. MD anticipating dc over the weekend with need for IV anbx therapy at home. Likely PICC placement tomorrow. Spoke with Pam at [facility] to update. Per Pam, they can accept pt and she will come to [facility] tomorrow to do teaching with pt.  Referred pt to [facility] Cornea) for charity [facility] and Corene Cornea accepted pt.   TOC will follow and continue to assist with dc planning.   Expected Discharge Plan: [place] Barriers to Discharge: Continued Medical Work up  Expected Discharge Plan and Services Expected Discharge Plan: [place] In-house Referral: Clinical Social Work     Living arrangements for the past 2 months: [place]: RN,IV Antibiotics HH Agency: [place] ([facility]) Date HH Agency Contacted: 03/09/21   Representative spoke with at [place]: [place] ([place]) Interventions    Readmission Risk Interventions Readmission Risk Prevention Plan 03/07/2021 01/27/2021  Transportation Screening Complete Complete  PCP or Specialist Appt within 3-5 Days - Complete  HRI or [place] - Complete  Social Work Consult for [place] Planning/Counseling - Complete  Palliative Care Screening - Not Applicable  Medication Review Press photographer) Complete Complete  HRI or Home Care Consult Complete -  SW Recovery Care/Counseling Consult Complete -  Palliative Care Screening Not Applicable -  [place] Not Applicable -  Some recent data might be hidden

## 2021-03-09 NOTE — Progress Notes (Signed)
*  PRELIMINARY RESULTS* Echocardiogram Echocardiogram Transesophageal has been performed.  Diamond [name] 03/09/2021, 11:36 AM

## 2021-03-09 NOTE — Transfer of Care (Signed)
Immediate Anesthesia Transfer of Care Note  Patient: Diamond [name]  Procedure(s) Performed: TRANSESOPHAGEAL ECHOCARDIOGRAM (TEE) WITH PROPOFOL (N/A )  Patient Location: PACU  Anesthesia Type:General  Level of Consciousness: awake, alert , oriented and patient cooperative  Airway & Oxygen Therapy: Patient Spontanous Breathing and Patient connected to nasal cannula oxygen  Post-op Assessment: Report given to RN, Post -op Vital signs reviewed and stable and Patient moving all extremities X 4  Post vital signs: Reviewed and stable  Last Vitals:  Vitals Value Taken Time  BP 210/100 03/09/21 1100  Temp    Pulse 98 03/09/21 1101  Resp 13 03/09/21 1101  SpO2 100 % 03/09/21 1101  Vitals shown include unvalidated device data.  Last Pain:  Vitals:   03/09/21 1036  TempSrc:   PainSc: 0-No pain      Patients Stated Pain Goal: 3 (02/14/93 5859)  Complications: No complications documented.

## 2021-03-09 NOTE — Progress Notes (Addendum)
      INFECTIOUS DISEASE ATTENDING ADDENDUM:   Date: 03/09/2021  Patient name: Diamond [name]  Medical record number: [id number]  Date of birth: Apr 27, 1996   Patient discussed with Dr. Denton Brick  Transesophageal echocardiogram was without vegetations.  However her inflammatory markers were quite elevated.  In the context of her severe back pain and MSSA bacteremia would prefer to treated with 6 weeks of IV antibiotics.  I will arrange hospital follow-up with her in our clinic.  Diagnosis: MSSA bacteremia possible discitis  Culture Result: MSSA  Allergies  Allergen Reactions  . Amoxicillin Hives    Did it involve swelling of the face/tongue/throat, SOB, or low BP? no  Did it involve sudden or severe rash/hives, skin peeling, or any reaction on the inside of your mouth or nose? no Did you need to seek medical attention at a hospital or doctor's office? no When did it last happen?unk If all above answers are "NO", may proceed with cephalosporin use.  Marland Kitchen Dexamethasone Hives and Itching  . Doxycycline Itching and Swelling  . Penicillins Hives and Itching    Did it involve swelling of the face/tongue/throat, SOB, or low BP? no  Did it involve sudden or severe rash/hives, skin peeling, or any reaction on the inside of your mouth or nose? no Did you need to seek medical attention at a hospital or doctor's office? no When did it last happen?unk If all above answers are "NO", may proceed with cephalosporin use. Did it involve swelling of the face/tongue/throat, SOB, or low BP? no  Did it involve sudden or severe rash/hives, skin peeling, or any reaction on the inside of your mouth or nose? no Did you need to seek medical attention at a hospital or doctor's office? no When did it last happen?unk If all above answers are "NO", may proceed with cephalosporin use. Tolerated keflex May 2021 with no issue.   . Ceftriaxone Sodium In Dextrose Itching  . Augmentin  [Amoxicillin-Pot Clavulanate] Other (See Comments)    Reaction unknown  . Clavulanic Acid   . Adhesive [Tape] Itching and Rash  . Latex Itching and Rash  . Other Itching and Rash  . Pineapple Rash    Rash on tongue and throat    OPAT Orders  Cefazolin  Duration:  6 weeks End Date:  04/17/2021  [facility] Care Per Protocol:    Labs  weekly while on IV antibiotics: x__ CBC with differential _x_ BMP w GFR/CMP _x_ CRP _x_ ESR    x__ Please pull PIC at completion of IV antibiotics __ Please leave PIC in place until doctor has seen patient or been notified  Fax weekly labs to [phone]   SHAMIYA DEMERITT has an appointment on 04/10/2021 with Dr. Tommy Medal at [place] for Infectious Disease is located in the [facility] at  [address] in [place].  [address], which is located to the left of the elevators.  Phone: [phone]  Fax: [phone]  [email or web address]  She should arrive 15 minutes prior to the appointment   Rhina Brackett Dam 03/09/2021, 5:37 PM

## 2021-03-09 NOTE — Anesthesia Postprocedure Evaluation (Signed)
Anesthesia Post Note  Patient: Diamond [name]  Procedure(s) Performed: TRANSESOPHAGEAL ECHOCARDIOGRAM (TEE) WITH PROPOFOL (N/A )  Patient location during evaluation: Phase II Anesthesia Type: General Level of consciousness: awake Pain management: pain level controlled Vital Signs Assessment: post-procedure vital signs reviewed and stable Respiratory status: spontaneous breathing and respiratory function stable Cardiovascular status: blood pressure returned to baseline and stable Postop Assessment: no headache and no apparent nausea or vomiting Anesthetic complications: no Comments: Late entry   No complications documented.   Last Vitals:  Vitals:   03/09/21 1100 03/09/21 1115  BP: (!) 210/100 (!) 235/107  Pulse: 98 (!) 103  Resp: 14 14  Temp: 36.8 C   SpO2: 100% 100%    Last Pain:  Vitals:   03/09/21 1100  TempSrc:   PainSc: [place]

## 2021-03-09 NOTE — Progress Notes (Addendum)
[facility] NEPHROLOGY PROGRESS NOTE  Assessment/ Plan: Pt is a 25 y.o. yo female with hypertension, Crohn's disease, uncontrolled type 1 diabetes with multiple prior admission for HHS, hyperglycemia, admitted with significant weight gain, anasarca, MSSA bacteremia.  #Nephrotic syndrome with anasarca likely diabetic nephropathy: UA with 3.95 g proteinuria without microscopic hematuria.  Status post IR guided kidney biopsy on 4/5, await biopsy result.  Serologies including ANA, ANCA, dsDNA, ASO, anti-GBM, complement unremarkable.  Lasix and lisinopril held on 4/6 because of AKI and received IV albumin. The creatinine level is better today.  She is n.p.o. today for TEE therefore we will continue to hold both medication.  May be able to start small dose of Lasix and lisinopril tomorrow. Strict ins and out and avoid nephrotoxins.  #Acute kidney injury likely hemodynamically mediated with diuretics, lisinopril, hyperglycemia and reduced renal perfusion.  Continue to hold lisinopril and diuretics as above.  Creatinine level improving.  No urine output is measured although it was discussed with the patient and staff yesterday.  #MSSA bacteremia, pneumonia: Seen by ID.  Now on cefazolin.  Plan for TEE today by cardiologist.  WBC trending down.  #Mild hyperkalemia due to KCl, lisinopril and hyperglycemia.  Both were discontinued.  Potassium level improved today.  #Type I uncontrolled diabetes with hyperglycemia: Insulin and DM management per primary team.  # Anemia of acute illness: Received a dose of iron on 4/1, monitor hemoglobin and transfuse as needed.  # HTN/volume: Blood pressure acceptable.  Lisinopril and Lasix held as above.  Monitor BP and plan to start lisinopril tomorrow if creatinine level improves.  Subjective: Seen and examined at bedside.  No new event.  TEE was canceled yesterday and plan for today.  No urine output is measured.  Patient denies nausea, vomiting, chest  pain, shortness of breath.  She is asking when she can go home.  Objective Vital signs in last 24 hours: Vitals:   03/08/21 1330 03/08/21 2048 03/09/21 0332 03/09/21 0500  BP: 109/63 (!) 122/106 (!) 156/91   Pulse: 95 (!) 110 98   Resp: 17 18 (!) 21   Temp: 98 F (36.7 C) 99.8 F (37.7 C) 98.2 F (36.8 C)   TempSrc: Oral Oral    SpO2: 100% 96% 95%   Weight:    60.8 kg  Height:       Weight change: 0.1 kg  Intake/Output Summary (Last 24 hours) at 03/09/2021 0855 Last data filed at 03/08/2021 1358 Gross per 24 hour  Intake 560 ml  Output --  Net 560 ml       Labs: Basic Metabolic Panel: Recent Labs  Lab 03/07/21 0446 03/08/21 0520 03/09/21 0558  NA 134* 130* 139  K 4.8 5.4* 4.3  CL 96* 94* 100  CO2 28 27 30   GLUCOSE 349* 591* 261*  BUN 15 25* 22*  CREATININE 1.57* 2.16* 1.50*  CALCIUM 8.2* 8.2* 8.9  PHOS 5.0* 5.4* 4.7*   Liver Function Tests: Recent Labs  Lab 03/02/21 1419 03/03/21 0436 03/04/21 0542 03/07/21 0446 03/08/21 0520 03/09/21 0558  AST 11* 13*  --   --   --   --   ALT 11 11  --   --   --   --   ALKPHOS 96 78  --   --   --   --   BILITOT 0.6 0.3  --   --   --   --   PROT 7.2 5.8*  --   --   --   --  ALBUMIN 1.9* 1.4*   < > 1.7* 1.8* 2.4*   < > = values in this interval not displayed.   No results for input(s): LIPASE, AMYLASE in the last 168 hours. No results for input(s): AMMONIA in the last 168 hours. CBC: Recent Labs  Lab 03/02/21 1419 03/03/21 0436 03/05/21 0431 03/06/21 0625 03/07/21 0446 03/08/21 0520 03/09/21 0558  WBC 12.1*   < > 13.7* 12.8* 11.7* 10.7* 8.6  NEUTROABS 8.6*  --   --   --   --   --   --   HGB 9.4*   < > 8.2* 8.0* 7.5* 7.5* 7.1*  HCT 29.0*   < > 26.2* 26.2* 24.7* 24.1* 22.9*  MCV 89.5   < > 92.9 92.6 93.6 93.1 92.3  PLT 559*   < > 524* 495* 502* 492* 457*   < > = values in this interval not displayed.   Cardiac Enzymes: Recent Labs  Lab 03/06/21 0925  CKTOTAL 52   CBG: Recent Labs  Lab  03/08/21 1109 03/08/21 1620 03/08/21 2044 03/09/21 0330 03/09/21 0749  GLUCAP 382* 165* 121* 240* 257*    Iron Studies: No results for input(s): IRON, TIBC, TRANSFERRIN, FERRITIN in the last 72 hours. Studies/Results: US BIOPSY (KIDNEY)  Result Date: 03/07/2021 INDICATION: 25 year old female with a history of nephrotic syndrome EXAM: IMAGE GUIDED MEDICAL RENAL BIOPSY MEDICATIONS: None. ANESTHESIA/SEDATION: Moderate (conscious) sedation was employed during this procedure. A total of Versed 2.0 mg and Fentanyl 50 mcg was administered intravenously. Moderate Sedation Time: 13 minutes. The patient's level of consciousness and vital signs were monitored continuously by radiology nursing throughout the procedure under my direct supervision. FLUOROSCOPY TIME:  Ultrasound COMPLICATIONS: None PROCEDURE: Informed written consent was obtained from the patient after a thorough discussion of the procedural risks, benefits and alternatives. All questions were addressed. Maximal Sterile Barrier Technique was utilized including caps, mask, sterile gowns, sterile gloves, sterile drape, hand hygiene and skin antiseptic. A timeout was performed prior to the initiation of the procedure. Patient was positioned prone position on the gantry table. Images were stored sent to PACs. Once the patient is prepped and draped in the usual sterile fashion, the skin and subcutaneous tissues overlying the left kidney were generously infiltrated 1% lidocaine for local anesthesia. Using ultrasound guidance, a 15 gauge guide needle was advanced into the lower cortex of the left kidney. Once we confirmed location of the needle tip, 2 separate 16 gauge core biopsy were achieved. Two Gel-Foam pledgets were infused with a small amount of saline. The needle was removed. Final images were stored. The patient tolerated the procedure well and remained hemodynamically stable throughout. No complications were encountered and no significant blood loss  encountered. IMPRESSION: Status post image guided medical renal biopsy of the left kidney. Signed, Dulcy Fanny. Dellia Nims, RPVI [facility] Electronically Signed   By: Corrie Mckusick D.O.   On: 03/07/2021 12:38    Medications: Infusions: .  ceFAZolin (ANCEF) IV 2 g (03/08/21 2128)    Scheduled Medications: . acetaminophen  650 mg Oral Q6H  . dextrose  25 mL Intravenous Once  . enoxaparin (LOVENOX) injection  40 mg Subcutaneous Q24H  . insulin aspart  0-9 Units Subcutaneous TID WC  . insulin aspart  4 Units Subcutaneous TID WC  . insulin glargine  16 Units Subcutaneous Daily  . pantoprazole  40 mg Oral Daily  . tamsulosin  0.4 mg Oral Daily    have reviewed scheduled and prn  medications.  Physical Exam: General:NAD, comfortable Heart:RRR, s1s2 nl Lungs:clear b/l, no crackle Abdomen:soft, Non-tender, non-distended Extremities: Only trace pedal edema, unchanged Skin: No rash  Rance Smithson Prasad Xandria Gallaga 03/09/2021,8:55 AM  LOS: 7 days

## 2021-03-09 NOTE — Anesthesia Preprocedure Evaluation (Signed)
Anesthesia Evaluation  Patient identified by MRN, date of birth, ID band Patient awake    Reviewed: Allergy & Precautions, NPO status , Patient's Chart, lab work & pertinent test results  History of Anesthesia Complications (+) Family history of anesthesia reaction and history of anesthetic complications (mother had cardiac arrest and died under anesthesia.)  Airway Mallampati: II  TM Distance: >3 FB Neck ROM: Full    Dental  (+) Dental Advisory Given, Missing, Chipped, Poor Dentition   Pulmonary asthma , pneumonia, resolved,           Cardiovascular Exercise Tolerance: Poor hypertension, Pt. on medications  Rhythm:Regular Rate:Normal     Neuro/Psych PSYCHIATRIC DISORDERS Anxiety Depression Bipolar Disorder  Neuromuscular disease CVA, Residual Symptoms    GI/Hepatic Neg liver ROS, GERD (gastroparesis )  Medicated,  Endo/Other  diabetes, Poorly Controlled, Type 2, Insulin Dependent  Renal/GU negative Renal ROS     Musculoskeletal negative musculoskeletal ROS (+)   Abdominal   Peds  (+) ADHD Hematology negative hematology ROS (+)   Anesthesia Other Findings   Reproductive/Obstetrics                             Anesthesia Physical  Anesthesia Plan  ASA: III  Anesthesia Plan: General   Post-op Pain Management:    Induction: Intravenous  PONV Risk Score and Plan: TIVA and Propofol infusion  Airway Management Planned: Nasal Cannula and Natural Airway  Additional Equipment:   Intra-op Plan:   Post-operative Plan:   Informed Consent: I have reviewed the patients History and Physical, chart, labs and discussed the procedure including the risks, benefits and alternatives for the proposed anesthesia with the patient or authorized representative who has indicated his/her understanding and acceptance.     Dental advisory given  Plan Discussed with: CRNA and Surgeon  Anesthesia  Plan Comments:         Anesthesia Quick Evaluation

## 2021-03-09 NOTE — CV Procedure (Signed)
CV Procedure Note  Procedure: Transesophageal echocardiogram Indication: Bacteremia Physician: Dr [name] MD   Patient was brought to the procedure suite after appropriate consent was obtained. The posterior oropharynx was anesthesized with 2% viscous lidocaine. Bite block was placed and patient was placed in the left lateral decubitus position. Sedation was achieved with the assistance of anesthesiology, for details please refer to there note. TEE probe was intubated into the esophagus without difficultly and several views obtained. Cardiopulmonary monitoring performed throughout the procedure, she tolerated well without complications   No evidence of vegetations, no signs of endocarditis   [name] MD

## 2021-03-09 NOTE — Progress Notes (Signed)
PROGRESS NOTE   Diamond [name]  UEA:[id number] DOB: 03-02-1996 DOA: 03/02/2021 PCP: [name], PA-C   Chief Complaint  Patient presents with  . Weight Gain   Level of care: Med-Surg  Brief Admission History:  25 y.o. female, with past medical history of uncontrolled type 1 diabetes mellitus, with multiple admissions in the past for HHS, and hyperglycemia, anasarca, hypertension, she presents to ED due to significant weight gain recently discharged from [place] 4 days ago, she reports 30 pounds weight gain since, reports her weight was 115 pounds on discharge, report today's weight 145, reports she has been compliant with low-salt diet, Lasix, she denies any fever, chills, chest pain, patient cardiac echo in 2021 with preserved EF 65 to 70%, with mild left ventricular hypertrophy, her BNP within normal limit, she does report some shortness of breath, intermittent cough, abdominal distention and decreased renal pain and whole body aching. - in ED, creatinine at 0.92, her albumin 1.9, LFTs within normal limit, she is with known anemia hemoglobin of 9.4, BNP normal at 89, chest x-ray with no evidence of volume overload, patient received 80 mg of IV Lasix and [facility] consulted to admit.  Assessment & Plan:   Principal Problem:   Multifocal pneumonia Active Problems:   Uncontrolled type 1 diabetes mellitus with hyperglycemia (HCC)   GERD (gastroesophageal reflux disease)   Bilateral lower extremity edema   Anasarca   Essential hypertension, benign   Crohn's disease ([place])    1)Staph Aureus Multifocal pneumonia and Sepsis- -patient met sepsis criteria on admission -Chest x-ray consistent with pneumonia -  V/Q scan was low probability for PE -Sepsis pathophysiology resolving -Leukocytosis has resolved, -Fever resolved, tachycardia improving Discussed with ID Physician Dr Alvino [place]--given back pain and high inflammatory makers (CRP 4.8, ESR 140)  with  clinical concerns for possible diskitis (MRI is neg) ID recommends IV Ancef for 6 weeks -Continue IV Ancef, pt does not appear to have frank allergy  2)Staph aureus bacteremia and sepsis- Pt denies any history of IV drug use or recreational drugs -Patient appears to have 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 -ID consult and IV Ancef as above #1 -  repeat blood cultures from 03/07/21-NGTD - MRI L spine with no  Infection/diskitis seen.   Dr. Harl Bowie completed TEE on 03/09/21 without endocarditis or vegetations -ESR and CRP as above  3)Nephrotic Syndrome --with anasarca and proteinuria -Status post renal biopsy at [facility] on 03/07/[facility]21 -Nephrology consult appreciated with 24 urine protein pending -We will discontinue lisinopril due to worsening renal function   4)AKI----acute kidney injury due to dehydration -  creatinine on admission= 0.9 , baseline creatinine = 1   , creatinine is now= 1.50 (peak this admission was 2.16) -- renally adjust medications, avoid nephrotoxic agents / dehydration  / hypotension -Hold lisinopril as above #3  5)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 -Hemoglobin is down to 7.1, may need transfusion of PRBCs  8)Chronic leg back and knee pain - MRI of L spine with contrast without acute findings  ESR and CRP as above #1  9)Constipation - Continue miralax as needed.     10)Possible infiltrative Cardiac Disease- TTE from 03/06/21 with Moderate LVH, myocardium with somewhat  speckled appearance. Mild valve  thickening. Trivial pericardial effusion. Findings could suggest  infiltrative cardiac disease. Consider outpatient cardiac MRI for further  evaluation. . Left ventricular ejection is 65 to 70 %   DVT prophylaxis: Enoxaparin Code Status: Full Family Communication: No family at bedside  Disposition: Home when medically stable Status is: Inpatient  Remains inpatient appropriate because:Staph bacteremia, sepsis, pneumonia requiring IV antibiotics pending further work-up including TEE  Dispo: The patient is from: Home              Anticipated d/c is to: Home with HH iv abx              Patient currently is not medically stable to d/c.   Difficult to place patient No  Consultants:   Nephrology/ID/Cardiology    Procedures:   TEE 03/09/21  Antimicrobials:    Subjective: Tolerated TEE well Elevated BP noted No vomiting, no fevers  Objective: Vitals:   03/09/21 0500 03/09/21 0946 03/09/21 1100 03/09/21 1115  BP:  (!) 196/116 (!) 210/100 (!) 235/107  Pulse:  (!) 111 98 (!) 103  Resp:  12 14 14   Temp:  99.4 F (37.4 C) 98.2 F (36.8 C)   TempSrc:  Oral    SpO2:  100% 100% 100%  Weight: 60.8 kg     Height:        Intake/Output Summary (Last 24 hours) at 03/09/2021 1246 Last data filed at 03/09/2021 1101 Gross per 24 hour  Intake 820 ml  Output --  Net 820 ml   Filed Weights   03/06/21 1727 03/08/21 0500 03/09/21 0500  Weight: 60.3 kg 60.7 kg 60.8 kg    Examination:  General exam: chronically ill appearing, no acute distress  respiratory system: Fair symmetrical air movement, no wheezing. Cardiovascular system: normal S1 & S2 heard.  At least 2+ to 3+ pitting edema  Gastrointestinal system: Abdomen is nondistended, soft and nontender. . Normal bowel sounds heard. Central nervous system: Alert and oriented. No focal neurological deficits. Extremities: Good pedal pulses Skin: No rashes, lesions or ulcers Psychiatry: Judgement and  insight appear poor. Mood & affect appropriate  Data Reviewed: I have personally reviewed following labs and imaging studies  CBC: Recent Labs  Lab 03/02/21 1419 03/03/21 0436 03/05/21 0431 03/06/21 0625 03/07/21 0446 03/08/21 0520 03/09/21 0558  WBC 12.1*   < > 13.7* 12.8* 11.7* 10.7* 8.6  NEUTROABS 8.6*  --   --   --   --   --   --   HGB 9.4*   < > 8.2* 8.0* 7.5* 7.5* 7.1*  HCT 29.0*   < > 26.2* 26.2* 24.7* 24.1* 22.9*  MCV 89.5   < > 92.9 92.6 93.6 93.1 92.3  PLT 559*   < > 524* 495* 502* 492* 457*   < > = values in this interval not displayed.    Basic Metabolic Panel: Recent Labs  Lab 03/02/21 1419 03/03/21 0436 03/05/21 0431 03/06/21 0625 03/07/21 0446 03/08/21 0520 03/09/21 0558  NA 130*   < > 136 134* 134* 130* 139  K 4.1   < > 4.4 4.2 4.8 5.4* 4.3  CL 98   < > 98 97* 96* 94* 100  CO2 24   < > 27 28 28 27 30   GLUCOSE 413*   < > 117*  184* 349* 591* 261*  BUN 18   < > 15 13 15  25* 22*  CREATININE 0.92   < > 1.08* 1.08* 1.57* 2.16* 1.50*  CALCIUM 8.6*   < > 8.0* 8.0* 8.2* 8.2* 8.9  MG 2.1  --   --   --   --   --   --   PHOS  --    < > 5.1* 4.9* 5.0* 5.4* 4.7*   < > = values in this interval not displayed.    GFR: Estimated Creatinine Clearance: 45.8 mL/min (A) (by C-G formula based on SCr of 1.5 mg/dL (H)).  Liver Function Tests: Recent Labs  Lab 03/02/21 1419 03/03/21 0436 03/04/21 0542 03/05/21 0431 03/06/21 0625 03/07/21 0446 03/08/21 0520 03/09/21 0558  AST 11* 13*  --   --   --   --   --   --   ALT 11 11  --   --   --   --   --   --   ALKPHOS 96 78  --   --   --   --   --   --   BILITOT 0.6 0.3  --   --   --   --   --   --   PROT 7.2 5.8*  --   --   --   --   --   --   ALBUMIN 1.9* 1.4*   < > 1.8* 1.5* 1.7* 1.8* 2.4*   < > = values in this interval not displayed.    CBG: Recent Labs  Lab 03/09/21 0330 03/09/21 0749 03/09/21 0931 03/09/21 1116 03/09/21 1153  GLUCAP 240* 257* 228* 165* 141*    Recent Results (from the past 240  hour(s))  Resp Panel by RT-PCR (Flu A&B, Covid) Nasopharyngeal Swab     Status: None   Collection Time: 03/02/21  3:32 PM   Specimen: Nasopharyngeal Swab; Nasopharyngeal(NP) swabs in vial transport medium  Result Value Ref Range Status   SARS Coronavirus 2 by RT PCR NEGATIVE NEGATIVE Final    Comment: (NOTE) SARS-CoV-2 target nucleic acids are NOT DETECTED.  The SARS-CoV-2 RNA is generally detectable in upper respiratory specimens during the acute phase of infection. The lowest concentration of SARS-CoV-2 viral copies this assay can detect is 138 copies/mL. A negative result does not preclude SARS-Cov-2 infection and should not be used as the sole basis for treatment or other patient management decisions. A negative result may occur with  improper specimen collection/handling, submission of specimen other than nasopharyngeal swab, presence of viral mutation(s) within the areas targeted by this assay, and inadequate number of viral copies(<138 copies/mL). A negative result must be combined with clinical observations, patient history, and epidemiological information. The expected result is Negative.  Fact Sheet for Patients:  EntrepreneurPulse.com.au  Fact Sheet for Healthcare Providers:  IncredibleEmployment.be  This test is no t yet approved or cleared by the [place] FDA and  has been authorized for detection and/or diagnosis of SARS-CoV-2 by FDA under an Emergency Use Authorization (EUA). This EUA will remain  in effect (meaning this test can be used) for the duration of the COVID-19 declaration under Section 564(b)(1) of the Act, 21 U.S.C.section 360bbb-3(b)(1), unless the authorization is terminated  or revoked sooner.       Influenza A by PCR NEGATIVE NEGATIVE Final   Influenza B by PCR NEGATIVE NEGATIVE Final    Comment: (NOTE) The Xpert Xpress SARS-CoV-2/FLU/RSV plus assay is intended as an aid in the  diagnosis of influenza from  Nasopharyngeal swab specimens and should not be used as a sole basis for treatment. Nasal washings and aspirates are unacceptable for Xpert Xpress SARS-CoV-2/FLU/RSV testing.  Fact Sheet for Patients: EntrepreneurPulse.com.au  Fact Sheet for Healthcare Providers: IncredibleEmployment.be  This test is not yet approved or cleared by the [place] FDA and has been authorized for detection and/or diagnosis of SARS-CoV-2 by FDA under an Emergency Use Authorization (EUA). This EUA will remain in effect (meaning this test can be used) for the duration of the COVID-19 declaration under Section 564(b)(1) of the Act, 21 U.S.C. section 360bbb-3(b)(1), unless the authorization is terminated or revoked.  Performed at [facility], [address] [place], [place] [address]   Culture, blood (Routine X 2) w Reflex to ID Panel     Status: None   Collection Time: 03/03/21  4:48 PM   Specimen: BLOOD LEFT HAND  Result Value Ref Range Status   Specimen Description BLOOD LEFT HAND  Final   Special Requests   Final    Blood Culture adequate volume BOTTLES DRAWN AEROBIC AND ANAEROBIC   Culture   Final    NO GROWTH 5 DAYS Performed at [facility], [address], [place], [place] [address]    Report Status 03/08/2021 FINAL  Final  Culture, blood (Routine X 2) w Reflex to ID Panel     Status: None   Collection Time: 03/03/21  4:54 PM   Specimen: Left Antecubital; Blood  Result Value Ref Range Status   Specimen Description LEFT ANTECUBITAL  Final   Special Requests   Final    Blood Culture adequate volume BOTTLES DRAWN AEROBIC AND ANAEROBIC   Culture   Final    NO GROWTH 5 DAYS Performed at [facility], [address] [place], [place] [address]    Report Status 03/08/2021 FINAL  Final  Culture, blood (routine x 2)     Status: Abnormal   Collection Time: 03/05/21  4:31 AM   Specimen: Left Antecubital; Blood  Result Value Ref Range Status   Specimen  Description   Final    LEFT ANTECUBITAL BOTTLES DRAWN AEROBIC AND ANAEROBIC Performed at [facility], [address], [place], [place] [address]    Special Requests   Final    Blood Culture adequate volume Performed at [facility], [address], [place], [place] [address]    Culture  Setup Time   Final    AEROBIC BOTTLE GRAM POSITIVE COCCI IN CLUSTERS CRITICAL RESULT CALLED TO, READ BACK BY AND VERIFIED WITH: MOODY,N 2215 03/05/2021 COLEMAN,R ANAEROBIC BOTTLE GRAM POSITIVE COCCI IN CLUSTERS RESULTS PREVIOUSLY CALLED Organism ID to follow CRITICAL RESULT CALLED TO, READ BACK BY AND VERIFIED WITH: Performed at [facility], [place] [address] [place], [place] [address]    Culture STAPHYLOCOCCUS AUREUS (A)  Final   Report Status 03/08/2021 FINAL  Final   Organism ID, Bacteria STAPHYLOCOCCUS AUREUS  Final      Susceptibility   Staphylococcus aureus - MIC*    CIPROFLOXACIN <=0.5 SENSITIVE Sensitive     ERYTHROMYCIN RESISTANT Resistant     GENTAMICIN <=0.5 SENSITIVE Sensitive     OXACILLIN <=0.25 SENSITIVE Sensitive     TETRACYCLINE <=1 SENSITIVE Sensitive     VANCOMYCIN <=0.5 SENSITIVE Sensitive     TRIMETH/SULFA <=10 SENSITIVE Sensitive     CLINDAMYCIN RESISTANT Resistant     RIFAMPIN <=0.5 SENSITIVE Sensitive     Inducible Clindamycin POSITIVE Resistant     * STAPHYLOCOCCUS AUREUS  Culture, blood (routine x 2)     Status: None (Preliminary result)   Collection Time: 03/05/21  4:31 AM   Specimen: BLOOD LEFT HAND  Result Value Ref Range Status   Specimen Description   Final    BLOOD LEFT HAND BOTTLES DRAWN AEROBIC AND ANAEROBIC   Special Requests Blood Culture adequate volume  Final   Culture   Final    NO GROWTH 3 DAYS Performed at [facility], [address] [place], [place] [address]    Report Status PENDING  Incomplete  Blood Culture ID Panel (Reflexed)     Status: Abnormal   Collection Time: 03/05/21  4:31 AM  Result Value Ref Range Status   Enterococcus  faecalis NOT DETECTED NOT DETECTED Final   Enterococcus Faecium NOT DETECTED NOT DETECTED Final   Listeria monocytogenes NOT DETECTED NOT DETECTED Final   Staphylococcus species DETECTED (A) NOT DETECTED Final    Comment: CRITICAL RESULT CALLED TO, READ BACK BY AND VERIFIED WITH: RN CANDICE ROSE BY MESSAN H. AT 0332 ON 03/06/2021    Staphylococcus aureus (BCID) DETECTED (A) NOT DETECTED Final    Comment: CRITICAL RESULT CALLED TO, READ BACK BY AND VERIFIED WITH: RN CANDICE ROSE BY MESSAN H. AT 0332 ON 03/06/2021    Staphylococcus epidermidis NOT DETECTED NOT DETECTED Final   Staphylococcus lugdunensis NOT DETECTED NOT DETECTED Final   Streptococcus species NOT DETECTED NOT DETECTED Final   Streptococcus agalactiae NOT DETECTED NOT DETECTED Final   Streptococcus pneumoniae NOT DETECTED NOT DETECTED Final   Streptococcus pyogenes NOT DETECTED NOT DETECTED Final   A.calcoaceticus-baumannii NOT DETECTED NOT DETECTED Final   Bacteroides fragilis NOT DETECTED NOT DETECTED Final   Enterobacterales NOT DETECTED NOT DETECTED Final   Enterobacter cloacae complex NOT DETECTED NOT DETECTED Final   Escherichia coli NOT DETECTED NOT DETECTED Final   Klebsiella aerogenes NOT DETECTED NOT DETECTED Final   Klebsiella oxytoca NOT DETECTED NOT DETECTED Final   Klebsiella pneumoniae NOT DETECTED NOT DETECTED Final   Proteus species NOT DETECTED NOT DETECTED Final   Salmonella species NOT DETECTED NOT DETECTED Final   Serratia marcescens NOT DETECTED NOT DETECTED Final   Haemophilus influenzae NOT DETECTED NOT DETECTED Final   Neisseria meningitidis NOT DETECTED NOT DETECTED Final   Pseudomonas aeruginosa NOT DETECTED NOT DETECTED Final   Stenotrophomonas maltophilia NOT DETECTED NOT DETECTED Final   Candida albicans NOT DETECTED NOT DETECTED Final   Candida auris NOT DETECTED NOT DETECTED Final   Candida glabrata NOT DETECTED NOT DETECTED Final   Candida krusei NOT DETECTED NOT DETECTED Final   Candida  parapsilosis NOT DETECTED NOT DETECTED Final   Candida tropicalis NOT DETECTED NOT DETECTED Final   Cryptococcus neoformans/gattii NOT DETECTED NOT DETECTED Final   Meth resistant mecA/C and MREJ NOT DETECTED NOT DETECTED Final    Comment: Performed at [facility], [address], [place], [place] [address]  Culture, blood (Routine X 2) w Reflex to ID Panel     Status: None (Preliminary result)   Collection Time: 03/07/21  4:46 AM   Specimen: Left Antecubital; Blood  Result Value Ref Range Status   Specimen Description LEFT ANTECUBITAL  Final   Special Requests   Final    BOTTLES DRAWN AEROBIC AND ANAEROBIC Blood Culture adequate volume   Culture   Final    NO GROWTH 1 DAY Performed at [facility], [address] [place], [place] [address]    Report Status PENDING  Incomplete  Culture, blood (Routine X  2) w Reflex to ID Panel     Status: None (Preliminary result)   Collection Time: 03/07/21  4:46 AM   Specimen: BLOOD LEFT HAND  Result Value Ref Range Status   Specimen Description BLOOD LEFT HAND  Final   Special Requests   Final    BOTTLES DRAWN AEROBIC AND ANAEROBIC Blood Culture adequate volume   Culture   Final    NO GROWTH 1 DAY Performed at [facility], [address], [place], [place] [address]    Report Status PENDING  Incomplete     Radiology Studies: ECHO TEE  Result Date: 03/09/2021    TRANSESOPHOGEAL ECHO REPORT   Patient Name:   ABRI VACCA Date of Exam: 03/09/2021 Medical Rec #:  [id number]        Height:       59.0 in Accession #:    [id number]       Weight:       134.0 lb Date of Birth:  1996-02-08        BSA:          1.556 m Patient Age:    24 years         BP:           235/107 mmHg Patient Gender: F                HR:           104 bpm. Exam Location:  [facility] Procedure: 2D Echo Indications:    Bacteremia 790.7 / R78.81  History:        Patient has prior history of Echocardiogram examinations, most                 recent 03/06/2021. Risk  Factors:Non-Smoker, Hypertension and                 Diabetes. Pneumonia.  Sonographer:    Leavy Cella RDCS (AE) Referring Phys: [id number] [place]: The transesophogeal probe was passed without difficulty through the esophogus of the patient. Sedation performed by different physician. The patient developed no complications during the procedure. IMPRESSIONS  1. Left ventricular ejection fraction, by estimation, is 60 to 65%. The left ventricle has normal function.  2. Right ventricular systolic function is normal. The right ventricular size is normal.  3. No left atrial/left atrial appendage thrombus was detected. The LAA emptying velocity was 50 cm/s.  4. The mitral valve is normal in structure. No evidence of mitral valve regurgitation. No evidence of mitral stenosis.  5. The aortic valve is tricuspid. Aortic valve regurgitation is not visualized. No aortic stenosis is present.  6. The inferior vena cava is normal in size with greater than 50% respiratory variability, suggesting right atrial pressure of 3 mmHg. Conclusion(s)/Recommendation(s): No evidence of vegetation/infective endocarditis on this transesophageal echocardiogram. FINDINGS  Left Ventricle: Left ventricular ejection fraction, by estimation, is 60 to 65%. The left ventricle has normal function. The left ventricular internal cavity size was normal in size. Right Ventricle: The right ventricular size is normal. No increase in right ventricular wall thickness. Right ventricular systolic function is normal. Left Atrium: Left atrial size was normal in size. No left atrial/left atrial appendage thrombus was detected. The LAA emptying velocity was 50 cm/s. Right Atrium: Right atrial size was normal in size. Pericardium: There is no evidence of pericardial effusion. Mitral Valve: The mitral valve is normal in structure. No evidence of mitral valve regurgitation. No evidence of mitral valve stenosis.  Tricuspid Valve: The tricuspid valve  is normal in structure. Tricuspid valve regurgitation is mild . No evidence of tricuspid stenosis. Aortic Valve: The aortic valve is tricuspid. Aortic valve regurgitation is not visualized. No aortic stenosis is present. Pulmonic Valve: The pulmonic valve was normal in structure. Pulmonic valve regurgitation is not visualized. No evidence of pulmonic stenosis. Aorta: The aortic root is normal in size and structure. Venous: The inferior vena cava is normal in size with greater than 50% respiratory variability, suggesting right atrial pressure of 3 mmHg. IAS/Shunts: No atrial level shunt detected by color flow Doppler. [name] MD Electronically signed by [name] MD Signature Date/Time: 03/09/2021/12:13:16 PM    Final    Scheduled Meds: . acetaminophen  650 mg Oral Q6H  . dextrose  25 mL Intravenous Once  . enoxaparin (LOVENOX) injection  40 mg Subcutaneous Q24H  . insulin aspart  0-9 Units Subcutaneous TID WC  . insulin aspart  4 Units Subcutaneous TID WC  . insulin glargine  16 Units Subcutaneous Daily  . lidocaine      . pantoprazole  40 mg Oral Daily  . tamsulosin  0.4 mg Oral Daily   Continuous Infusions: .  ceFAZolin (ANCEF) IV 2 g (03/08/21 2128)     LOS: 7 days   Roxan Hockey, MD How to contact the [facility] Attending or Consulting provider [place] or covering provider during after hours [place], for this patient?  1. Check the care team in [facility] and look for a) attending/consulting [facility] provider listed and b) the [facility] team listed 2. Log into [email or web address] and use [place] universal password to access. If you do not have the password, please contact the hospital operator. 3. Locate the [facility] provider you are looking for under [facility] and page to a number that you can be directly reached. 4. If you still have difficulty reaching the provider, please page the [facility] (Director on Call) for the Hospitalists listed on amion for assistance.  03/09/2021, 12:46 PM

## 2021-03-09 NOTE — H&P (Signed)
Procedure H&P  Patient presents for TEE in the setting of MSSA bacteremia. Will plan for procedure today with the assistance of anesthesiology. For full medical history please see references admission H&P below.  [name] MD        HPI:    Diamond [name]  is a 25 y.o. female, with past medical history of uncontrolled type 1 diabetes mellitus, with multiple admissions in the past for HHS, and hyperglycemia, anasarca, hypertension, she presents to ED due to significant weight gain recently discharged from [facility] 4 days ago, she reports 30 pounds weight gain since, reports her weight was 115 pounds on discharge, report today's weight 145, reports she has been compliant with low-salt diet, Lasix, she denies any fever, chills, chest pain, patient cardiac echo in 2021 with preserved EF 65 to 70%, with mild left ventricular hypertrophy, her BNP within normal limit, she does report some shortness of breath, intermittent cough, abdominal distention and decreased renal pain and whole body aching. - in ED, creatinine at 0.92, her albumin 1.9, LFTs within normal limit, she is with known anemia hemoglobin of 9.4, BNP normal at 89, chest x-ray with no evidence of volume overload, patient received 80 mg of IV Lasix and [facility] hospitalist consulted to admit.    Review of systems:    In addition to the HPI above,  No Fever-chills, fatigue, increased weakness No Headache, No changes with Vision or hearing, No problems swallowing food or Liquids, No Chest pain, reports cough and shortness of breath No Abdominal pain, No Nausea or Vommitting, Bowel movements are regular, No Blood in stool or Urine, No dysuria, report increased frequency and urgency No new skin rashes or bruises, No new joints pains-aches,  No new weakness, tingling, numbness in any extremity, No recent weight gain or loss, No polyuria, polydypsia or polyphagia, No significant Mental Stressors.  A full 10  point Review of Systems was done, except as stated above, all other Review of Systems were negative.   With Past History of the following :        Past Medical History:  Diagnosis Date  . ADHD (attention deficit hyperactivity disorder) 2007  . Allergy   . Asthma   . Bipolar disorder ([place])   . Crohn's colitis ([place])    Dx Dr. Cherrie Gauze  . Depression    early childhood, trial of seroquel?  . Diabetic neuropathy ([place])   . Edema   . GERD (gastroesophageal reflux disease)   . Hypertension   . Pancreatitis   . Panic attacks   . PTSD (post-traumatic stress disorder)   . Stroke ([place]) 07/2020   R side  . Type 1 diabetes ([place]) 2008           Past Surgical History:  Procedure Laterality Date  . ADENOIDECTOMY    . BIOPSY  02/01/2021   Procedure: BIOPSY;  Surgeon: Rogene Houston, MD;  [facility];  Service: Endoscopy;;  duodenal, esophageal,  . BIOPSY  02/03/2021   Procedure: BIOPSY;  Surgeon: Eloise Harman, DO;  [facility];  Service: Endoscopy;;  . COLONOSCOPY WITH PROPOFOL N/A 02/01/2021   Procedure: COLONOSCOPY WITH PROPOFOL;  Surgeon: Rogene Houston, MD;  [facility];  Service: Endoscopy;  Laterality: N/A;  . COLONOSCOPY WITH PROPOFOL N/A 02/03/2021   Procedure: COLONOSCOPY WITH PROPOFOL;  Surgeon: Eloise Harman, DO;  [facility];  Service: Endoscopy;  Laterality: N/A;  . ESOPHAGOGASTRODUODENOSCOPY (EGD) WITH PROPOFOL N/A 02/01/2021  Procedure: ESOPHAGOGASTRODUODENOSCOPY (EGD) WITH PROPOFOL;  Surgeon: Rogene Houston, MD;  [facility];  Service: Endoscopy;  Laterality: N/A;  . HERNIA REPAIR    . TONSILLECTOMY        Social History:     Social History       Tobacco Use  . Smoking status: Never Smoker  . Smokeless tobacco: Never Used  Substance Use Topics  . Alcohol use: No      Family History :          Family History  Problem Relation Age of Onset   . Hypertension Mother   . Diabetes Mother   . Heart disease Mother   . Crohn's disease Father   . Alcohol abuse Father   . Drug abuse Father   . Mental illness Father   . Cancer Father   . Hypertension Maternal Aunt   . Seizures Maternal Aunt   . Depression Maternal Aunt   . Asthma Maternal Uncle   . Hypertension Maternal Uncle   . Cancer Maternal Grandmother   . Diabetes Maternal Grandmother   . Mental illness Maternal Grandfather      Home Medications:          Prior to Admission medications   Medication Sig Start Date End Date Taking? Authorizing Provider  albuterol (VENTOLIN HFA) 108 (90 Base) MCG/ACT inhaler Inhale 2 puffs into the lungs every 6 (six) hours as needed. For shortness of breath 02/15/21   [name], PA-C  citalopram (CELEXA) 10 MG tablet Take 10 mg by mouth daily. Patient not taking: Reported on 02/15/2021    [provider]  furosemide (LASIX) 20 MG tablet Take 2 tablets (40 mg total) by mouth daily. 02/15/21   [name], PA-C  gabapentin (NEURONTIN) 100 MG capsule Take 1 capsule (100 mg total) by mouth daily. 10/13/20   [name], PA-C  insulin glargine (LANTUS) 100 UNIT/ML injection Inject 0.3 mLs (30 Units total) into the skin daily. 02/03/21   Barton Dubois, MD  Insulin Glulisine (APIDRA [place]) Inject into the skin. ssi    [provider]  lidocaine (LIDODERM) 5 % Place 1 patch onto the skin daily. Remove & Discard patch within 12 hours or as directed by MD Patient not taking: Reported on 02/15/2021 02/12/21   Evalee Jefferson, PA-C  lisinopril (ZESTRIL) 20 MG tablet Take 1 tablet (20 mg total) by mouth daily. 02/15/21   [name], PA-C  metoCLOPramide (REGLAN) 5 MG tablet Take 1 tablet (5 mg total) by mouth every 8 (eight) hours as needed for refractory nausea / vomiting (refractory symptoms). Patient not taking: Reported on 02/15/2021 02/03/21 02/03/22  Barton Dubois, MD  omeprazole  (PRILOSEC) 40 MG capsule Take 1 capsule (40 mg total) by mouth daily. 02/15/21   [name], PA-C  ondansetron (ZOFRAN ODT) 8 MG disintegrating tablet Take 1 tablet (8 mg total) by mouth every 8 (eight) hours as needed for nausea or vomiting. 02/15/21   [name], PA-C  potassium chloride (KLOR-CON) 10 MEQ tablet Take 1 tablet (10 mEq total) by mouth daily. Take While taking Lasix/furosemide 02/15/21   [name], PA-C  tamsulosin (FLOMAX) 0.4 MG CAPS capsule Take 1 capsule (0.4 mg total) by mouth daily. 02/15/21   [name], PA-C  traMADol (ULTRAM) 50 MG tablet Take 1 tablet (50 mg total) by mouth every 6 (six) hours as needed. Patient not taking: Reported on 02/15/2021 02/12/21   Evalee Jefferson, PA-C  traZODone (DESYREL) 50 MG tablet Take 50 mg  by mouth at bedtime. Patient not taking: Reported on 02/15/2021    [provider]     Allergies:          Allergies  Allergen Reactions  . Amoxicillin Hives    Did it involve swelling of the face/tongue/throat, SOB, or low BP? no  Did it involve sudden or severe rash/hives, skin peeling, or any reaction on the inside of your mouth or nose? no Did you need to seek medical attention at a hospital or doctor's office? no When did it last happen?unk If all above answers are "NO", may proceed with cephalosporin use.  Marland Kitchen Dexamethasone Hives and Itching  . Doxycycline Itching and Swelling  . Penicillins Hives and Itching    Did it involve swelling of the face/tongue/throat, SOB, or low BP? no  Did it involve sudden or severe rash/hives, skin peeling, or any reaction on the inside of your mouth or nose? no Did you need to seek medical attention at a hospital or doctor's office? no When did it last happen?unk If all above answers are "NO", may proceed with cephalosporin use. Did it involve swelling of the face/tongue/throat, SOB, or low BP? no  Did it involve sudden or severe rash/hives, skin  peeling, or any reaction on the inside of your mouth or nose? no Did you need to seek medical attention at a hospital or doctor's office? no When did it last happen?unk If all above answers are "NO", may proceed with cephalosporin use. Tolerated keflex May 2021 with no issue.   . Augmentin [Amoxicillin-Pot Clavulanate] Other (See Comments)    Reaction unknown  . Ceftriaxone Sodium In Dextrose   . Clavulanic Acid   . Adhesive [Tape] Itching and Rash  . Latex Itching and Rash  . Other Itching and Rash  . Pineapple Rash    Rash on tongue and throat     Physical Exam:   Vitals  Blood pressure (!) 160/119, pulse (!) 113, temperature 97.6 F (36.4 C), temperature source Temporal, resp. rate (!) 27, height 4' 11"  (1.499 m), weight 66.5 kg, SpO2 98 %.   1. General , well-developed female, laying in bed in no apparent distress  2. Normal affect and insight, Not Suicidal or Homicidal, Awake Alert, Oriented X 3.  3. No F.N deficits, ALL C.Nerves Intact, Strength 5/5 all 4 extremities, Sensation intact all 4 extremities, Plantars down going.  4. Ears and Eyes appear Normal, Conjunctivae clear, PERRLA. Moist Oral Mucosa.  He does have periorbital edema  5. Supple Neck, No JVD, No cervical lymphadenopathy appriciated, No Carotid Bruits.  6. Symmetrical Chest wall movement, Good air movement bilaterally, CTAB.  7. RRR, No Gallops, Rubs or Murmurs, No Parasternal Heave.  8. Positive Bowel Sounds, Abdomen Soft, No tenderness, No organomegaly appriciated,No rebound -guarding or rigidity.  9.  No Cyanosis, Normal Skin Turgor, No Skin Rash or Bruise.  Significant with significant anasarca, with lower extremity +3 edema, with abdominal swelling as well,  10. Good muscle tone,  joints appear normal , no effusions, Normal ROM.  11. No Palpable Lymph Nodes in Neck or Axillae   Patient nurse Lyn Henri, was present at bedside during exam.   Data Review:     CBC Last Labs      Recent Labs  Lab 03/02/21 1419  WBC 12.1*  HGB 9.4*  HCT 29.0*  PLT 559*  MCV 89.5  MCH 29.0  MCHC 32.4  RDW 12.4  LYMPHSABS 1.9  MONOABS 0.9  EOSABS 0.6*  BASOSABS 0.1     ------------------------------------------------------------------------------------------------------------------  Chemistries  Last Labs      Recent Labs  Lab 03/02/21 1419  NA 130*  K 4.1  CL 98  CO2 24  GLUCOSE 413*  BUN 18  CREATININE 0.92  CALCIUM 8.6*  MG 2.1  AST 11*  ALT 11  ALKPHOS 96  BILITOT 0.6     ------------------------------------------------------------------------------------------------------------------ estimated creatinine clearance is 78.1 mL/min (by C-G formula based on SCr of 0.92 mg/dL). ------------------------------------------------------------------------------------------------------------------  Recent Labs (last 2 labs)   No results for input(s): TSH, T4TOTAL, T3FREE, THYROIDAB in the last 72 hours.  Invalid input(s): FREET3    Coagulation profile Last Labs      Recent Labs  Lab 03/02/21 1419  INR 1.0     ------------------------------------------------------------------------------------------------------------------- Recent Labs (last 2 labs)   No results for input(s): DDIMER in the last 72 hours.   -------------------------------------------------------------------------------------------------------------------  Cardiac Enzymes  Last Labs   No results for input(s): CKMB, TROPONINI, MYOGLOBIN in the last 168 hours.  Invalid input(s): CK   ------------------------------------------------------------------------------------------------------------------ Labs (Brief)          Component Value Date/Time   BNP 89.0 03/02/2021 1419       ---------------------------------------------------------------------------------------------------------------  Urinalysis Labs (Brief)          Component  Value Date/Time   COLORURINE STRAW (A) 02/11/2021 2105   APPEARANCEUR CLEAR 02/11/2021 2105   APPEARANCEUR Clear 11/15/2020 1531   LABSPEC 1.020 02/11/2021 2105   PHURINE 7.0 02/11/2021 2105   GLUCOSEU >=500 (A) 02/11/2021 2105   HGBUR NEGATIVE 02/11/2021 2105   BILIRUBINUR NEGATIVE 02/11/2021 2105   BILIRUBINUR NEG 12/05/2020 1353   BILIRUBINUR Negative 11/15/2020 1531   KETONESUR 5 (A) 02/11/2021 2105   PROTEINUR >=300 (A) 02/11/2021 2105   UROBILINOGEN 0.2 12/05/2020 1353   UROBILINOGEN 0.2 09/13/2012 2014   NITRITE NEGATIVE 02/11/2021 2105   LEUKOCYTESUR NEGATIVE 02/11/2021 2105      ----------------------------------------------------------------------------------------------------------------   Imaging Results:     Imaging Results (Last 48 hours)  DG Chest Port 1 View  Result Date: 03/02/2021 CLINICAL DATA:  Shortness of breath Gain 30 pound in 4 days Diffuse swelling EXAM: PORTABLE CHEST 1 VIEW COMPARISON:  02/02/2021 FINDINGS: The heart size and mediastinal contours are within normal limits. Both lungs are clear. The visualized skeletal structures are unremarkable. IMPRESSION: No active disease. Electronically Signed   By: Miachel Roux M.D.   On: 03/02/2021 14:19       Assessment & Plan:    Active Problems:   Uncontrolled type 1 diabetes mellitus with hyperglycemia (HCC)   GERD (gastroesophageal reflux disease)   Bilateral lower extremity edema   Anasarca   Essential hypertension, benign   Crohn's disease ([place])    Anasarca -This is most likely in the setting of nephrotic syndrome, given anasarca, hypoalbuminemia, and proteinuria> 3 g per 24 hours(4 hours collection last November) -We will start on IV Lasix 40 mg IV twice daily. -We will keep on strict ins and outs -We will keep on low-salt diet. -We will obtain 24-hour urine protein. -Has been consulted, will evaluate. -BNP is normal at 89, echo last year with a preserved EF, mild  hypertrophy, low clinical suspicion for CHF exacerbation.  Diabetes mellitus, type I, uncontrolled -We will resume Lantus 30 units (patient cannot recall what type of insulin she is currently taking), will add insulin sliding scale, will check A1c, will keep on insulin sliding scale.  Hypertension -continue with lisinopril  Normocytic normochromic anemia -At baseline,. -We will obtain  anemia panel   Thrombocytosis -We will continue to monitor  Albuminemia/proteinuria -Likely due to nephrotic syndrome   DVT Prophylaxis  Lovenox  AM Labs Ordered, also please review Full Orders  Family Communication: Admission, patients condition and plan of care including tests being ordered have been discussed with the patient  who indicate understanding and agree with the plan and Code Status.  Code Status Full  Likely DC to  Home  Condition GUARDED   Consults called: renal    Admission status: inpatient    Time spent in minutes : 60 minutes   Phillips Climes M.D on 03/02/2021 at 3:55 PM

## 2021-03-10 ENCOUNTER — Encounter (HOSPITAL_COMMUNITY): Payer: Self-pay | Admitting: Cardiology

## 2021-03-10 DIAGNOSIS — E1121 Type 2 diabetes mellitus with diabetic nephropathy: Secondary | ICD-10-CM | POA: Diagnosis present

## 2021-03-10 LAB — RENAL FUNCTION PANEL
Albumin: 2.2 g/dL — ABNORMAL LOW (ref 3.5–5.0)
Anion gap: 11 (ref 5–15)
BUN: 17 mg/dL (ref 6–20)
CO2: 29 mmol/L (ref 22–32)
Calcium: 8.7 mg/dL — ABNORMAL LOW (ref 8.9–10.3)
Chloride: 95 mmol/L — ABNORMAL LOW (ref 98–111)
Creatinine, Ser: 1.27 mg/dL — ABNORMAL HIGH (ref 0.44–1.00)
GFR, Estimated: >60 mL/min (ref >60)
Glucose, Bld: 327 mg/dL — ABNORMAL HIGH (ref 70–99)
Phosphorus: 4.9 mg/dL — ABNORMAL HIGH (ref 2.5–4.6)
Potassium: 4.7 mmol/L (ref 3.5–5.1)
Sodium: 135 mmol/L (ref 135–145)

## 2021-03-10 LAB — GLUCOSE, CAPILLARY
Glucose-Capillary: 329 mg/dL — ABNORMAL HIGH (ref 70–99)
Glucose-Capillary: 295 mg/dL — ABNORMAL HIGH (ref 70–99)
Glucose-Capillary: 189 mg/dL — ABNORMAL HIGH (ref 70–99)
Glucose-Capillary: 250 mg/dL — ABNORMAL HIGH (ref 70–99)
Glucose-Capillary: 218 mg/dL — ABNORMAL HIGH (ref 70–99)

## 2021-03-10 LAB — CULTURE, BLOOD (ROUTINE X 2)
Culture: NO GROWTH
Special Requests: ADEQUATE

## 2021-03-10 MED ORDER — ONDANSETRON 8 MG PO TBDP: 8.0000 mg | ORAL_TABLET | Freq: Three times a day (TID) | ORAL | 2 refills | Status: AC | PRN

## 2021-03-10 MED ORDER — DIPHENHYDRAMINE HCL 12.5 MG/5ML PO ELIX: 25.0000 mg | ORAL_SOLUTION | Freq: Four times a day (QID) | ORAL | 0 refills | Status: DC | PRN

## 2021-03-10 MED ORDER — INSULIN LISPRO (1 UNIT DIAL) 100 UNIT/ML (KWIKPEN): 10.0000 [IU] | PEN_INJECTOR | Freq: Three times a day (TID) | SUBCUTANEOUS | 11 refills | Status: DC

## 2021-03-10 MED ORDER — LISINOPRIL 10 MG PO TABS: 10.0000 mg | ORAL_TABLET | Freq: Every day | ORAL | 5 refills | Status: DC

## 2021-03-10 MED ORDER — CEFAZOLIN IV (FOR PTA / DISCHARGE USE ONLY): 2.0000 g | Freq: Three times a day (TID) | INTRAVENOUS | 0 refills | Status: DC

## 2021-03-10 MED ORDER — LISINOPRIL 10 MG PO TABS
10.0000 mg | ORAL_TABLET | Freq: Every day | ORAL | Status: DC
  Administered 2021-03-10: 10 mg via ORAL
  Filled 2021-03-10: qty 1

## 2021-03-10 MED ORDER — SODIUM CHLORIDE 0.9% FLUSH
10.0000 mL | INTRAVENOUS | Status: DC | PRN
Start: 2021-03-10 — End: 2021-03-11

## 2021-03-10 MED ORDER — CHLORHEXIDINE GLUCONATE CLOTH 2 % EX PADS: 6.0000 | MEDICATED_PAD | Freq: Every day | CUTANEOUS | Status: DC

## 2021-03-10 MED ORDER — ALBUTEROL SULFATE HFA 108 (90 BASE) MCG/ACT IN AERS: 2.0000 | INHALATION_SPRAY | Freq: Four times a day (QID) | RESPIRATORY_TRACT | 0 refills | Status: DC | PRN

## 2021-03-10 MED ORDER — INSULIN GLARGINE 100 UNIT/ML ~~LOC~~ SOLN: 45.0000 [IU] | Freq: Every day | SUBCUTANEOUS | 11 refills | Status: DC

## 2021-03-10 MED ORDER — OMEPRAZOLE 40 MG PO CPDR: 40.0000 mg | DELAYED_RELEASE_CAPSULE | Freq: Every day | ORAL | 3 refills | Status: DC

## 2021-03-10 MED ORDER — GABAPENTIN 100 MG PO CAPS
100.0000 mg | ORAL_CAPSULE | Freq: Two times a day (BID) | ORAL | 5 refills | Status: DC
Start: 2021-03-10 — End: 2021-10-19

## 2021-03-10 MED ORDER — ISOSORBIDE MONONITRATE ER 30 MG PO TB24: 30.0000 mg | ORAL_TABLET | Freq: Every day | ORAL | 5 refills | Status: DC

## 2021-03-10 MED ORDER — SODIUM CHLORIDE 0.9% FLUSH
10.0000 mL | Freq: Two times a day (BID) | INTRAVENOUS | Status: DC
  Administered 2021-03-10: 10 mL

## 2021-03-10 MED ORDER — TRAZODONE HCL 50 MG PO TABS: 50.0000 mg | ORAL_TABLET | Freq: Every day | ORAL | 1 refills | Status: DC

## 2021-03-10 MED ORDER — HYDRALAZINE HCL 25 MG PO TABS: 25.0000 mg | ORAL_TABLET | Freq: Two times a day (BID) | ORAL | 3 refills | Status: DC

## 2021-03-10 NOTE — TOC Transition Note (Signed)
Transition of Care [facility]) - CM/SW Discharge Note   Patient Details  Name: Diamond [name] MRN: [id number] Date of Birth: 01-Feb-1996  Transition of Care [facility]) CM/SW Contact:  Shade Flood, LCSW Phone Number: 03/10/2021, 11:00 AM   Clinical Narrative:     Pt stable for dc today per MD. Damaris Schooner with Pam at [facility] and she reports that she did teaching with pt and with pt's aunt (on FaceTime) yesterday and pt is all set from their end. Pt will have what she needs to dose herself at home starting tonight. [facility] will follow for [facility] RN needs.  No other TOC needs for dc.  Final next level of care: [place] Barriers to Discharge: Barriers Resolved   Patient Goals and CMS Choice Patient states their goals for this hospitalization and ongoing recovery are:: return home   Choice offered to / list presented to : Patient  Discharge Placement                       Discharge Plan and Services In-house Referral: Clinical Social Work                        HH Arranged: RN,IV Antibiotics HH Agency: [place] ([facility]) Date HH Agency Contacted: 03/09/21   Representative spoke with at [place]: [place] ([place]) Interventions     Readmission Risk Interventions Readmission Risk Prevention Plan 03/07/2021 01/27/2021  Transportation Screening Complete Complete  PCP or Specialist Appt within 3-5 Days - Complete  HRI or [place] - Complete  Social Work Consult for [place] Planning/Counseling - Complete  Palliative Care Screening - Not Applicable  Medication Review Press photographer) Complete Complete  HRI or Home Care Consult Complete -  SW Recovery Care/Counseling Consult Complete -  Palliative Care Screening Not Applicable -  [place] Not Applicable -  Some recent data might be hidden

## 2021-03-10 NOTE — Progress Notes (Signed)
Pt states that she is unsure of how to administer her pm dose of IV antibiotics herself so Nunzio Cory, SW and Dr. Joesph Fillers notified. Dr. Joesph Fillers discussed situation with pt and decided to keep the pt inpatient until she receives her pm IV dose of antibiotics and then let her go home. Pt verbalized understanding and states that she will have a ride after the pm dose of antibiotics tonight.

## 2021-03-10 NOTE — Progress Notes (Addendum)
[facility] NEPHROLOGY PROGRESS NOTE  Assessment/ Plan: Pt is a 25 y.o. yo female with hypertension, Crohn's disease, uncontrolled type 1 diabetes with multiple prior admission for HHS, hyperglycemia, admitted with significant weight gain, anasarca, MSSA bacteremia.  #Nephrotic syndrome with anasarca due to diabetic nephropathy: UA with 3.95 g proteinuria without microscopic hematuria.  Status post IR guided kidney biopsy on 4/5. The preliminary result is diabetic nephropathy, pending final biopsy result.  Serologies including ANA, ANCA, dsDNA, ASO, anti-GBM, complement unremarkable.  AKI improved after holding both lasix and lisinopril. Serum creatinine level down to 1.27 today. I will resume small dose of lisinopril. Great urine output without diuretics and volume status is acceptable. Likely discharge home today. We will arrange OP follow up at [facility] clinic.  Need better glycemic control with goal hb1c <7.    #Acute kidney injury likely hemodynamically mediated with diuretics, lisinopril, hyperglycemia and reduced renal perfusion.  Improved now. Resume ACEI gradually. Continue to hold diuretics.   #MSSA bacteremia, pneumonia: TEE without vegetation. Repeat blood culture negative. ID planning 6 weeks of IV abx Cefazolin with end date 04/17/2021.   #Mild hyperkalemia due to KCl, lisinopril and hyperglycemia. Improved. Off of KCL. Resuming lisinopril. Monitor lab. Recommend low potassium diet.   #Type I uncontrolled diabetes with hyperglycemia: Insulin and DM management per primary team. May need endocrinology manage her DM.   # Anemia of acute illness: Received a dose of iron on 4/1, monitor hemoglobin and transfuse as needed.  # HTN/volume: Blood pressure suboptima. She is on hydralazine, imdur and adding ACEI. No edema now.   Subjective: Seen and examined at bedside. She had TEE yesterday without vegetation. C/o back pain. Urine output 3 L in 24 hours. Denies N/V/D/CP or  SOB. Plan to discharge home today.   Objective Vital signs in last 24 hours: Vitals:   03/09/21 2306 03/10/21 0104 03/10/21 0500 03/10/21 0523  BP: 115/80 119/80  (!) 146/99  Pulse: (!) 113 (!) 116  (!) 110  Resp: 20 14  20   Temp: 98.3 F (36.8 C) 100.1 F (37.8 C)  99.6 F (37.6 C)  TempSrc: Axillary Oral  Oral  SpO2: 100% 90%  92%  Weight:   60.9 kg   Height:       Weight change: 0.1 kg  Intake/Output Summary (Last 24 hours) at 03/10/2021 1101 Last data filed at 03/10/2021 0900 Gross per 24 hour  Intake 1440 ml  Output 2600 ml  Net -1160 ml       Labs: Basic Metabolic Panel: Recent Labs  Lab 03/08/21 0520 03/09/21 0558 03/10/21 0510  NA 130* 139 135  K 5.4* 4.3 4.7  CL 94* 100 95*  CO2 27 30 29   GLUCOSE 591* 261* 327*  BUN 25* 22* 17  CREATININE 2.16* 1.50* 1.27*  CALCIUM 8.2* 8.9 8.7*  PHOS 5.4* 4.7* 4.9*   Liver Function Tests: Recent Labs  Lab 03/08/21 0520 03/09/21 0558 03/10/21 0510  ALBUMIN 1.8* 2.4* 2.2*   No results for input(s): LIPASE, AMYLASE in the last 168 hours. No results for input(s): AMMONIA in the last 168 hours. CBC: Recent Labs  Lab 03/05/21 0431 03/06/21 0625 03/07/21 0446 03/08/21 0520 03/09/21 0558  WBC 13.7* 12.8* 11.7* 10.7* 8.6  HGB 8.2* 8.0* 7.5* 7.5* 7.1*  HCT 26.2* 26.2* 24.7* 24.1* 22.9*  MCV 92.9 92.6 93.6 93.1 92.3  PLT 524* 495* 502* 492* 457*   Cardiac Enzymes: Recent Labs  Lab 03/06/21 0925  CKTOTAL 52   CBG: Recent Labs  Lab 03/09/21 1153 03/09/21 1602 03/09/21 2118 03/10/21 0320 03/10/21 0723  GLUCAP 141* 331* 282* 329* 295*    Iron Studies: No results for input(s): IRON, TIBC, TRANSFERRIN, FERRITIN in the last 72 hours. Studies/Results: ECHO TEE  Result Date: 03/09/2021    TRANSESOPHOGEAL ECHO REPORT   Patient Name:   Diamond [name] Date of Exam: 03/09/2021 Medical Rec #:  [id number]        Height:       59.0 in Accession #:    [id number]       Weight:       134.0 lb Date of Birth:   1996-09-11        BSA:          1.556 m Patient Age:    24 years         BP:           235/107 mmHg Patient Gender: F                HR:           104 bpm. Exam Location:  [facility] Procedure: 2D Echo Indications:    Bacteremia 790.7 / R78.81  History:        Patient has prior history of Echocardiogram examinations, most                 recent 03/06/2021. Risk Factors:Non-Smoker, Hypertension and                 Diabetes. Pneumonia.  Sonographer:    Leavy Cella RDCS (AE) Referring Phys: [id number] [place]: The transesophogeal probe was passed without difficulty through the esophogus of the patient. Sedation performed by different physician. The patient developed no complications during the procedure. IMPRESSIONS  1. Left ventricular ejection fraction, by estimation, is 60 to 65%. The left ventricle has normal function.  2. Right ventricular systolic function is normal. The right ventricular size is normal.  3. No left atrial/left atrial appendage thrombus was detected. The LAA emptying velocity was 50 cm/s.  4. The mitral valve is normal in structure. No evidence of mitral valve regurgitation. No evidence of mitral stenosis.  5. The aortic valve is tricuspid. Aortic valve regurgitation is not visualized. No aortic stenosis is present.  6. The inferior vena cava is normal in size with greater than 50% respiratory variability, suggesting right atrial pressure of 3 mmHg. Conclusion(s)/Recommendation(s): No evidence of vegetation/infective endocarditis on this transesophageal echocardiogram. FINDINGS  Left Ventricle: Left ventricular ejection fraction, by estimation, is 60 to 65%. The left ventricle has normal function. The left ventricular internal cavity size was normal in size. Right Ventricle: The right ventricular size is normal. No increase in right ventricular wall thickness. Right ventricular systolic function is normal. Left Atrium: Left atrial size was normal in size. No left atrial/left  atrial appendage thrombus was detected. The LAA emptying velocity was 50 cm/s. Right Atrium: Right atrial size was normal in size. Pericardium: There is no evidence of pericardial effusion. Mitral Valve: The mitral valve is normal in structure. No evidence of mitral valve regurgitation. No evidence of mitral valve stenosis. Tricuspid Valve: The tricuspid valve is normal in structure. Tricuspid valve regurgitation is mild . No evidence of tricuspid stenosis. Aortic Valve: The aortic valve is tricuspid. Aortic valve regurgitation is not visualized. No aortic stenosis is present. Pulmonic Valve: The pulmonic valve was normal in structure. Pulmonic valve regurgitation is not visualized. No evidence of pulmonic stenosis. Aorta: The aortic root  is normal in size and structure. Venous: The inferior vena cava is normal in size with greater than 50% respiratory variability, suggesting right atrial pressure of 3 mmHg. IAS/Shunts: No atrial level shunt detected by color flow Doppler. [name] MD Electronically signed by [name] MD Signature Date/Time: 03/09/2021/12:13:16 PM    Final    [place] EKG SITE RITE  Result Date: 03/09/2021 If Site Rite image not attached, placement could not be confirmed due to current cardiac rhythm.   Medications: Infusions: .  ceFAZolin (ANCEF) IV 2 g (03/10/21 0506)    Scheduled Medications: . acetaminophen  650 mg Oral Q6H  . Chlorhexidine Gluconate Cloth  6 each Topical Daily  . dextrose  25 mL Intravenous Once  . enoxaparin (LOVENOX) injection  40 mg Subcutaneous Q24H  . hydrALAZINE  25 mg Oral TID  . insulin aspart  0-9 Units Subcutaneous TID WC  . insulin aspart  4 Units Subcutaneous TID WC  . insulin glargine  16 Units Subcutaneous Daily  . isosorbide mononitrate  30 mg Oral Daily  . lisinopril  10 mg Oral Daily  . pantoprazole  40 mg Oral Daily  . sodium chloride flush  10-40 mL Intracatheter Q12H  . tamsulosin  0.4 mg Oral Daily    have reviewed scheduled  and prn medications.  Physical Exam: General:NAD, comfortable Heart:RRR, s1s2 nl Lungs:clear b/l, no crackle Abdomen:soft, Non-tender, non-distended Extremities: Only trace pedal edema (improved).  Skin: No rash  Diamond [name] 03/10/2021,11:01 AM  LOS: 8 days

## 2021-03-10 NOTE — Discharge Instructions (Signed)
1)Avoid ibuprofen/Advil/Aleve/Motrin/Goody Powders/Naproxen/BC powders/Meloxicam/Diclofenac/Indomethacin and other Nonsteroidal anti-inflammatory medications as these will make you more likely to bleed and can cause stomach ulcers, can also cause Kidney problems.   2)You have Staph bacteremia and sepsis-Home health registered nurse will help you get IV Ancef 2 g every 8 hours Last Day of Therapy:  04/17/21 Labs - Once weekly:  CBC/D and BMP, blood work [employer] - Every other week:  ESR and CRP blood work  3) outpatient follow-up with infectious disease specialist as scheduled  4) outpatient follow-up with nephrologist as scheduled

## 2021-03-10 NOTE — Discharge Summary (Signed)
Diamond [name], is a 25 y.o. female  DOB 05-31-1996  MRN [id number].  Admission date:  03/02/2021  Admitting Physician  Diamond Patricia, MD  Discharge Date:  03/10/2021   Primary MD  Diamond Dryer, PA-C  Recommendations for primary care physician for things to follow:   1)Avoid ibuprofen/Advil/Aleve/Motrin/Goody Powders/Naproxen/BC powders/Meloxicam/Diclofenac/Indomethacin and other Nonsteroidal anti-inflammatory medications as these will make you more likely to bleed and can cause stomach ulcers, can also cause Kidney problems.   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 ADHD (attention deficit hyperactivity disorder) 2007  . Allergy   . Asthma   . Bipolar disorder ([place])   . Crohn's colitis ([place])    Dx Dr. Cherrie [name]  . Depression    early childhood, trial of seroquel?  .  Diabetic neuropathy ([place])   . Edema   . GERD (gastroesophageal reflux disease)   . Hypertension   . Pancreatitis   . Panic attacks   . PTSD (post-traumatic stress disorder)   . Stroke ([place]) 07/2020   R side  . Type 1 diabetes ([place]) 2008    Past Surgical History:  Procedure Laterality Date  . ADENOIDECTOMY    . BIOPSY  02/01/2021   Procedure: BIOPSY;  Surgeon: Diamond Houston, MD;  [facility];  Service: Endoscopy;;  duodenal, esophageal,  . BIOPSY  02/03/2021   Procedure: BIOPSY;  Surgeon: Diamond Harman, DO;  [facility];  Service: Endoscopy;;  . COLONOSCOPY WITH PROPOFOL N/A 02/01/2021   Procedure: COLONOSCOPY WITH PROPOFOL;  Surgeon: Diamond Houston, MD;  [facility];  Service: Endoscopy;  Laterality: N/A;  . COLONOSCOPY WITH PROPOFOL N/A 02/03/2021   Procedure: COLONOSCOPY WITH PROPOFOL;  Surgeon: Diamond Harman, DO;  [facility];  Service: Endoscopy;  Laterality: N/A;  . ESOPHAGOGASTRODUODENOSCOPY (EGD) WITH PROPOFOL N/A 02/01/2021   Procedure: ESOPHAGOGASTRODUODENOSCOPY (EGD) WITH PROPOFOL;  Surgeon: Diamond Houston, MD;  [facility];  Service: Endoscopy;  Laterality: N/A;  . HERNIA REPAIR    .  TONSILLECTOMY        HPI  from the history and physical done on the day of admission:   [name] a24 y.o.female,with past medical history of uncontrolled type 1 diabetes mellitus, with multiple admissions in the past for HHS, and hyperglycemia, anasarca, hypertension, she presents to ED due to significant weight gain recently discharged from [facility] 4 days ago, she reports 30 pounds weight gain since, reports her weight was 115 pounds on discharge, report today's weight 145, reports she has been compliant with low-salt diet, Lasix, she denies any fever, chills, chest pain, patient cardiac echo in 2021 with preserved EF 65 to 70%, with mild left ventricular hypertrophy, her BNP within normal limit, she  does report some shortness of breath, intermittent cough, abdominal distention and decreased renal pain and whole body aching. - in ED, creatinine at 0.92, her albumin 1.9, LFTs within normal limit, she is with known anemia hemoglobin of 9.4, BNP normal at 89, chest x-ray with no evidence of volume overload, patient received 80 mg of IV Lasix and [facility] hospitalist consulted to admit.      Hospital Course:     Brief Admission History:  25 y.o.female,with past medical history of uncontrolled type 1 diabetes mellitus, with multiple admissions in the past for HHS, and hyperglycemia, anasarca, hypertension, she presents to ED due to significant weight gain recently discharged from [facility] 4 days ago, she reports 30 pounds weight gain since, reports her weight was 115 pounds on discharge, report today's weight 145, reports she has been compliant with low-salt diet, Lasix, she denies any fever, chills, chest pain, patient cardiac echo in 2021 with preserved EF 65 to 70%, with mild left ventricular hypertrophy, her BNP within normal limit, she does report some shortness of breath, intermittent cough, abdominal distention and decreased renal pain and whole body aching. - in ED, creatinine at 0.92, her albumin 1.9, LFTs within normal limit, she is with known anemia hemoglobin of 9.4, BNP normal at 89, chest x-ray with no evidence of volume overload, patient received 80 mg of IV Lasix and [facility] hospitalist consulted to admit.  Assessment & Plan:   Principal Problem:   Multifocal pneumonia Active Problems:   Uncontrolled type 1 diabetes mellitus with hyperglycemia (HCC)   GERD (gastroesophageal reflux disease)   Bilateral lower extremity edema   Anasarca   Essential hypertension, benign   Crohn's disease ([place])    1)Staph Aureus Multifocal pneumonia and Sepsis- -patient met sepsis criteria on admission -Chest x-ray consistent with pneumonia -  V/Q scan was low probability for  PE -Sepsis pathophysiology resolved -Leukocytosis has resolved, -Fever resolved, tachycardia improving Discussed with ID Physician Dr Alvino [place]--given back pain and high inflammatory makers (CRP 4.8, ESR 140)  with clinical concerns for possible diskitis (MRI is neg) ID recommends IV Ancef for 6 weeks -Continue IV Ancef, pt does not appear to have frank allergy to ancef -Home meds RN to help administer IV Ancef at home last dose 04/17/2021 -Outpatient follow-up with infectious disease with biliary  2)Staph aureus bacteremia and sepsis- Pt denies any history of IV drug use or recreational drugs -Patient appears to have 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 -ID consult and IV Ancef as above #1 -  repeat blood cultures from 03/07/21-NGTD - MRI L spine with no  Infection/diskitis seen.   Dr. Harl Bowie completed TEE on 03/09/21 without endocarditis or vegetations -ESR  and CRP  elevated as above  3)Nephrotic Syndrome --with anasarca and proteinuria -Status post renal biopsy at [facility] on 03/07/2021 with preliminary result suggestive of diabetic nephropathy -Discussed with Dr. Marcha Dutton--- okay to discharge on lisinopril 10 mg daily with outpatient nephrology follow-up  4)AKI----acute kidney injury due to dehydration-AKI is mostly resolved -  creatinine on admission= 0.9 , baseline creatinine = 1   , creatinine is now= 1.27 (peak this admission was 2.16) -- renally adjust medications, avoid nephrotoxic agents / dehydration  / hypotension - 5)Type 1 DM uncontrolled with renal complications and hyperglycemia- -insulin regimen adjusted, patient will need to follow-up with endocrinologist as outpatient   6)HTN-okay to discharge on lisinopril 10 mg daily after discussion with nephrologist, also discharge on isosorbide and hydralazine combo  7)Normocytic anemia-appreciate nephrology consult and recommendations for IV  iron infusion which has been given on 03/03/21. -Weekly CBC advised patient may need PRBC transfusion from time to time  8)Chronic leg back and knee pain - MRI of L spine with contrast without acute findings  ESR and CRP  elevated as above #1  9)Constipation - Continue miralax as needed.     10)Possible infiltrative Cardiac Disease- TTE from 03/06/21 with Moderate LVH, myocardium with somewhat speckled appearance. Mild valve  thickening. Trivial pericardial effusion. Findings could suggest  infiltrative cardiac disease. Consider outpatient cardiac MRI for further  evaluation. . Left ventricular ejection is 65 to 70 %   Code Status: Full Family Communication:  Called and discussed with patient's aunt  Disposition: Home with home health services for IV antibiotic administered  Dispo: The patient is from: Home  Anticipated d/c is to: Home with HH iv abx  Consultants:   Nephrology/ID/Cardiology    Procedures:   TEE 03/09/21 PICC line 03/10/2021   Discharge Condition: Stable  Follow UP   Follow-up Information    [facility]-Home Follow [facility].   Specialty: [place] Why: [facility] staff will call you to schedule home health visits       Tommy Medal, Lavell Islam, MD. Schedule an appointment as soon as possible for a visit in 3 week(s).   Specialty: Infectious Diseases Why: Staph sepsis and possible discitis Contact information: [address] [place] [address] [phone]        Rexene Agent, MD. Schedule an appointment as soon as possible for a visit in 1 week(s).   Specialty: Nephrology Why: Diabetic nephropathy with nephrotic syndrome Contact information: [facility] [phone]                Diet and Activity recommendation:  As advised  Discharge Instructions     Discharge Instructions    [facility] pharmacist to adjust dose for Vancomycin,  Aminoglycosides and other anti-infective therapies as requested by physician.   Complete by: As directed    [facility] to provide Cath Flo 46m   Complete by: As directed    Administer for PICC line occlusion and as ordered by physician for other access device issues.   Anaphylaxis Kit: Provided to treat any anaphylactic reaction to the medication being provided to the patient if First Dose or when requested by physician   Complete by: As directed    Epinephrine 176mml vial / amp: Administer 0.29m30m0.29ml42mubcutaneously once for moderate to severe anaphylaxis, nurse to call physician and pharmacy when reaction occurs and call 911 if needed for immediate care   Diphenhydramine 50mg38mIV vial: Administer 25-50mg 2mM PRN  for first dose reaction, rash, itching, mild reaction, nurse to call physician and pharmacy when reaction occurs   Sodium Chloride 0.9% NS 552m IV: Administer if needed for hypovolemic blood pressure drop or as ordered by physician after call to physician with anaphylactic reaction   Call MD for:  difficulty breathing, headache or visual disturbances   Complete by: As directed    Call MD for:  persistant dizziness or light-headedness   Complete by: As directed    Call MD for:  persistant nausea and vomiting   Complete by: As directed    Call MD for:  severe uncontrolled pain   Complete by: As directed    Call MD for:  temperature >100.4   Complete by: As directed    Change dressing on IV access line weekly and PRN   Complete by: As directed    Diet - low sodium heart healthy   Complete by: As directed    Diet Carb Modified   Complete by: As directed    Discharge instructions   Complete by: As directed    1)Avoid ibuprofen/Advil/Aleve/Motrin/Goody Powders/Naproxen/BC powders/Meloxicam/Diclofenac/Indomethacin and other Nonsteroidal anti-inflammatory medications as these will make you more likely to bleed and can cause stomach ulcers, can also cause Kidney  problems.   2)You have Staph bacteremia and sepsis-Home health registered nurse will help you get IV Ancef 2 g every 8 hours Last Day of Therapy:  04/17/21 Labs - Once weekly:  CBC/D and BMP, blood work [employer]- Every other week:  ESR and CRP blood work  3) outpatient follow-up with infectious disease specialist as scheduled  4) outpatient follow-up with nephrologist as scheduled   Flush IV access with Sodium Chloride 0.9% and Heparin 10 units/ml or 100 units/ml   Complete by: As directed    Home infusion instructions - [facility]   Complete by: As directed    Instructions: Flush IV access with Sodium Chloride 0.9% and Heparin 10units/ml or 100units/ml   Change dressing on IV access line: Weekly and PRN   Instructions Cath Flo 236m Administer for PICC Line occlusion and as ordered by physician for other access device   [facility] pharmacist to adjust dose for: Vancomycin, Aminoglycosides and other anti-infective therapies as requested by physician   Increase activity slowly   Complete by: As directed    Method of administration may be changed at the discretion of home infusion pharmacist based upon assessment of the patient and/or caregiver's ability to self-administer the medication ordered   Complete by: As directed        Discharge Medications     Allergies as of 03/10/2021      Reactions   Amoxicillin Hives   Did it involve swelling of the face/tongue/throat, SOB, or low BP? no  Did it involve sudden or severe rash/hives, skin peeling, or any reaction on the inside of your mouth or nose? no Did you need to seek medical attention at a hospital or doctor's office? no When did it last happen?unk If all above answers are "NO", may proceed with cephalosporin use.   Dexamethasone Hives, Itching   Doxycycline Itching, Swelling   Penicillins Hives, Itching   Did it involve swelling of the face/tongue/throat, SOB, or low BP? no  Did it involve sudden or  severe rash/hives, skin peeling, or any reaction on the inside of your mouth or nose? no Did you need to seek medical attention at a hospital or doctor's office? no When did it last happen?unk If  all above answers are "NO", may proceed with cephalosporin use. Did it involve swelling of the face/tongue/throat, SOB, or low BP? no  Did it involve sudden or severe rash/hives, skin peeling, or any reaction on the inside of your mouth or nose? no Did you need to seek medical attention at a hospital or doctor's office? no When did it last happen?unk If all above answers are "NO", may proceed with cephalosporin use. Tolerated keflex May 2021 with no issue.   Ceftriaxone Sodium In Dextrose Itching   Augmentin [amoxicillin-pot Clavulanate] Other (See Comments)   Reaction unknown   Clavulanic Acid    Adhesive [tape] Itching, Rash   Latex Itching, Rash   Other Itching, Rash   Pineapple Rash   Rash on tongue and throat      Medication List    STOP taking these medications   furosemide 40 MG tablet Commonly known as: LASIX   potassium chloride 10 MEQ CR capsule Commonly known as: MICRO-K   tamsulosin 0.4 MG Caps capsule Commonly known as: FLOMAX     TAKE these medications   albuterol 108 (90 Base) MCG/ACT inhaler Commonly known as: VENTOLIN HFA Inhale 2 puffs into the lungs every 6 (six) hours as needed for wheezing or shortness of breath. For shortness of breath What changed: reasons to take this   ceFAZolin  IVPB Commonly known as: ANCEF Inject 2 g into the vein every 8 (eight) hours. Indication:  Bacteremia, possible discitis First Dose: No Last Day of Therapy:  04/17/21 Labs - Once weekly:  CBC/D and BMP, Labs - Every other week:  ESR and CRP Method of administration: IV Push Method of administration may be changed at the discretion of home infusion pharmacist based upon assessment of the patient and/or caregiver's ability to self-administer the medication ordered.    diphenhydrAMINE 12.5 MG/5ML elixir Commonly known as: BENADRYL Take 10 mLs (25 mg total) by mouth every 6 (six) hours as needed for itching, allergies or sleep.   gabapentin 100 MG capsule Commonly known as: NEURONTIN Take 1 capsule (100 mg total) by mouth 2 (two) times daily. What changed: when to take this   hydrALAZINE 25 MG tablet Commonly known as: APRESOLINE Take 1 tablet (25 mg total) by mouth 2 (two) times daily.   insulin glargine 100 UNIT/ML injection Commonly known as: LANTUS Inject 0.45 mLs (45 Units total) into the skin at bedtime. What changed:   how much to take  when to take this   insulin lispro 100 UNIT/ML KwikPen Commonly known as: HUMALOG Inject 10-16 Units into the skin 3 (three) times daily before meals.   isosorbide mononitrate 30 MG 24 hr tablet Commonly known as: IMDUR Take 1 tablet (30 mg total) by mouth daily. Start taking on: March 11, 2021   lisinopril 10 MG tablet Commonly known as: ZESTRIL Take 1 tablet (10 mg total) by mouth daily. For Renal Protection/Proteinuria What changed:   medication strength  how much to take  additional instructions   metoCLOPramide 5 MG tablet Commonly known as: Reglan Take 1 tablet (5 mg total) by mouth every 8 (eight) hours as needed for refractory nausea / vomiting (refractory symptoms).   omeprazole 40 MG capsule Commonly known as: PRILOSEC Take 1 capsule (40 mg total) by mouth daily.   ondansetron 8 MG disintegrating tablet Commonly known as: Zofran ODT Take 1 tablet (8 mg total) by mouth every 8 (eight) hours as needed for nausea or vomiting.   traZODone 50 MG tablet Commonly known as:  DESYREL Take 1 tablet (50 mg total) by mouth at bedtime.            Discharge Care Instructions  (From admission, onward)         Start     Ordered   03/10/21 0000  Change dressing on IV access line weekly and PRN  (Home infusion instructions - [facility] )        03/10/21 1240           Major procedures and Radiology Reports - PLEASE review detailed and final reports for all details, in brief -   DG Chest 2 View  Result Date: 03/04/2021 CLINICAL DATA:  Fever and shortness of breath for several days. EXAM: CHEST - 2 VIEW COMPARISON:  03/02/2021 FINDINGS: Cardiomediastinal silhouette and pulmonary vasculature are within normal limits. Interval development of of bibasilar airspace opacities suspicious for pneumonia. IMPRESSION: Findings suspicious for multifocal pneumonia. Electronically Signed   By: Miachel Roux M.D.   On: 03/04/2021 10:28   MR Lumbar Spine W Wo Contrast  Result Date: 03/06/2021 CLINICAL DATA:  Staph bacteremia. Low back pain. Bilateral leg weakness. EXAM: MRI LUMBAR SPINE WITHOUT AND WITH CONTRAST TECHNIQUE: Multiplanar and multiecho pulse sequences of the lumbar spine were obtained without and with intravenous contrast. CONTRAST:  92m GADAVIST GADOBUTROL 1 MMOL/ML IV SOLN COMPARISON:  None. FINDINGS: Segmentation:  5 lumbar type vertebral bodies. Alignment:  Normal Vertebrae:  Normal.  No evidence of bone or joint infection. Conus medullaris and cauda equina: Conus extends to the L1 level. Conus and cauda equina appear normal. Paraspinal and other soft tissues: Deep para spinous soft tissues are normal. No evidence of epidural space infection. Nonspecific edema pattern/fluid accumulation within the fatty tissues of the back, superficial to the paraspinous muscles, extending from T12-S1. This is more pronounced than usually seen with simple dependent positioning and the patient could possibly have a fascial plane infection in the back. Disc levels: Mild bulging of the discs at L3-4, L4-5 and L5-S1 but no apparent compressive stenosis. IMPRESSION: 1. No evidence of epidural space infection. No evidence of spinal infection. 2. Nonspecific edema pattern within the fatty tissues of the back throughout the lumbar region, superficial to the paraspinous muscles. This is more  pronounced than usually seen with simple dependent positioning and the patient could possibly have a fascial plane infection in the back. Electronically Signed   By: MNelson ChimesM.D.   On: 03/06/2021 14:33   NM Pulmonary Perfusion  Result Date: 03/04/2021 CLINICAL DATA:  Positive D-dimer. Shortness of breath and chest pain several months. Low to intermediate probability for pulmonary embolism. EXAM: NUCLEAR MEDICINE PERFUSION LUNG SCAN TECHNIQUE: Perfusion images were obtained in multiple projections after intravenous injection of radiopharmaceutical. Ventilation scans intentionally deferred if perfusion scan and chest x-ray adequate for interpretation during COVID 19 epidemic. RADIOPHARMACEUTICALS:  4.4 mCi Tc-955mAA IV COMPARISON:  Two view chest 03/04/2021 FINDINGS: Examination demonstrates normal perfusion images without focal peripheral wedge-shaped perfusion defect. IMPRESSION: Normal perfusion lung scan without evidence of pulmonary emboli. Electronically Signed   By: DaMarin Olp.D.   On: 03/04/2021 11:06   [place] Img Lower Bilateral (DVT)  Result Date: 03/04/2021 CLINICAL DATA:  Bilateral lower extremity edema.  Evaluate for DVT. EXAM: BILATERAL LOWER EXTREMITY VENOUS DOPPLER ULTRASOUND TECHNIQUE: Gray-scale sonography with graded compression, as well as color Doppler and duplex ultrasound were performed to evaluate the lower extremity deep venous systems from the level of the common femoral vein and including the  common femoral, femoral, profunda femoral, popliteal and calf veins including the posterior tibial, peroneal and gastrocnemius veins when visible. The superficial great saphenous vein was also interrogated. Spectral Doppler was utilized to evaluate flow at rest and with distal augmentation maneuvers in the common femoral, femoral and popliteal veins. COMPARISON:  Nuclear medicine V/Q scan-03/04/2021 Bilateral lower extremity venous Doppler ultrasound-01/24/2021 (negative). FINDINGS:  RIGHT LOWER EXTREMITY Common Femoral Vein: No evidence of thrombus. Normal compressibility, respiratory phasicity and response to augmentation. Saphenofemoral Junction: No evidence of thrombus. Normal compressibility and flow on color Doppler imaging. Profunda Femoral Vein: No evidence of thrombus. Normal compressibility and flow on color Doppler imaging. Femoral Vein: No evidence of thrombus. Normal compressibility, respiratory phasicity and response to augmentation. Popliteal Vein: No evidence of thrombus. Normal compressibility, respiratory phasicity and response to augmentation. Calf Veins: No evidence of thrombus. Normal compressibility and flow on color Doppler imaging. Superficial Great Saphenous Vein: No evidence of thrombus. Normal compressibility. Venous Reflux:  None. Other Findings: There is a moderate amount of subcutaneous edema at the level the right calf. Redemonstrated prominent right inguinal lymph nodes with index right inguinal lymph node measuring 0.8 cm in greatest short axis diameter (image 2), similar to the 01/24/2021 examination, presumably reactive in etiology. LEFT LOWER EXTREMITY Common Femoral Vein: No evidence of thrombus. Normal compressibility, respiratory phasicity and response to augmentation. Saphenofemoral Junction: No evidence of thrombus. Normal compressibility and flow on color Doppler imaging. Profunda Femoral Vein: No evidence of thrombus. Normal compressibility and flow on color Doppler imaging. Femoral Vein: No evidence of thrombus. Normal compressibility, respiratory phasicity and response to augmentation. Popliteal Vein: No evidence of thrombus. Normal compressibility, respiratory phasicity and response to augmentation. Calf Veins: No evidence of thrombus. Normal compressibility and flow on color Doppler imaging. Superficial Great Saphenous Vein: No evidence of thrombus. Normal compressibility. Venous Reflux:  None. Other Findings: There is a minimal to moderate amount  of subcutaneous edema at the level of the left calf. IMPRESSION: No evidence of DVT within either lower extremity. Electronically Signed   By: Sandi Mariscal M.D.   On: 03/04/2021 11:56   DG Chest Port 1 View  Result Date: 03/02/2021 CLINICAL DATA:  Shortness of breath Gain 30 pound in 4 days Diffuse swelling EXAM: PORTABLE CHEST 1 VIEW COMPARISON:  02/02/2021 FINDINGS: The heart size and mediastinal contours are within normal limits. Both lungs are clear. The visualized skeletal structures are unremarkable. IMPRESSION: No active disease. Electronically Signed   By: Miachel Roux M.D.   On: 03/02/2021 14:19   ECHOCARDIOGRAM COMPLETE  Result Date: 03/06/2021    ECHOCARDIOGRAM REPORT   Patient Name:   Diamond [name] Date of Exam: 03/06/2021 Medical Rec #:  [id number]        Height:       59.0 in Accession #:    [id number]       Weight:       140.7 lb Date of Birth:  June 05, 1996        BSA:          1.588 m Patient Age:    24 years         BP:           134/96 mmHg Patient Gender: F                HR:           96 bpm. Exam Location:  [facility] Procedure: 2D Echo Indications:    Bacteremia R78.81  History:  Patient has prior history of Echocardiogram examinations, most                 recent 10/19/2020. Risk Factors:Non-Smoker, Diabetes and                 Hypertension. GERD, Pneumonia.  Sonographer:    Leavy Cella RDCS (AE) Referring Phys: [place]  1. Moderate LVH, myocardium with somewhat speckled appearance. Mild valve thickening. Trivial pericardial effusion. Findings could suggest infiltrative cardiac disease. Consider outpatient cardiac MRI for further evaluation. . Left ventricular ejection fraction, by estimation, is 65 to 70%. The left ventricle has normal function. The left ventricle has no regional wall motion abnormalities. There is moderate left ventricular hypertrophy. Left ventricular diastolic parameters were normal.  2. Right ventricular systolic function is  normal. The right ventricular size is normal.  3. The pericardial effusion is circumferential.  4. The mitral valve is abnormal. Trivial mitral valve regurgitation. No evidence of mitral stenosis.  5. The aortic valve is tricuspid. There is mild thickening of the aortic valve. Aortic valve regurgitation is not visualized. No aortic stenosis is present.  6. The inferior vena cava is normal in size with greater than 50% respiratory variability, suggesting right atrial pressure of 3 mmHg. FINDINGS  Left Ventricle: Moderate LVH, myocardium with somewhat speckled appearance. Mild valve thickening. Trivial pericardial effusion. Findings could suggest infiltrative cardiac disease. Consider outpatient cardiac MRI for further evaluation. Left ventricular ejection fraction, by estimation, is 65 to 70%. The left ventricle has normal function. The left ventricle has no regional wall motion abnormalities. The left ventricular internal cavity size was normal in size. There is moderate left ventricular hypertrophy. Left ventricular diastolic parameters were normal. Right Ventricle: The right ventricular size is normal. No increase in right ventricular wall thickness. Right ventricular systolic function is normal. Left Atrium: Left atrial size was normal in size. Right Atrium: Right atrial size was normal in size. Pericardium: Trivial pericardial effusion is present. The pericardial effusion is circumferential. Mitral Valve: The mitral valve is abnormal. There is mild thickening of the mitral valve leaflet(s). Trivial mitral valve regurgitation. No evidence of mitral valve stenosis. Tricuspid Valve: The tricuspid valve is normal in structure. Tricuspid valve regurgitation is not demonstrated. No evidence of tricuspid stenosis. Aortic Valve: The aortic valve is tricuspid. There is mild thickening of the aortic valve. Aortic valve regurgitation is not visualized. No aortic stenosis is present. Aortic valve mean gradient measures 4.8  mmHg. Aortic valve peak gradient measures 8.7 mmHg. Aortic valve area, by VTI measures 2.07 cm. Pulmonic Valve: The pulmonic valve was not well visualized. Pulmonic valve regurgitation is not visualized. No evidence of pulmonic stenosis. Aorta: The aortic root is normal in size and structure. Pulmonary Artery: Indeterminant PASP, inadequate TR jet. Venous: The inferior vena cava is normal in size with greater than 50% respiratory variability, suggesting right atrial pressure of 3 mmHg. IAS/Shunts: No atrial level shunt detected by color flow Doppler.  LEFT VENTRICLE PLAX 2D LVIDd:         3.42 cm  Diastology LVIDs:         1.76 cm  LV e' medial:    12.50 cm/s LV PW:         1.40 cm  LV E/e' medial:  5.9 LV IVS:        1.35 cm  LV e' lateral:   13.80 cm/s LVOT diam:     1.70 cm  LV E/e' lateral: 5.4 LV SV:  56 LV SV Index:   35 LVOT Area:     2.27 cm  RIGHT VENTRICLE RV S prime:     19.20 cm/s TAPSE (M-mode): 2.9 cm LEFT ATRIUM             Index       RIGHT ATRIUM           Index LA diam:        3.20 cm 2.02 cm/m  RA Area:     10.40 cm LA Vol (A2C):   40.0 ml 25.19 ml/m RA Volume:   21.10 ml  13.29 ml/m LA Vol (A4C):   27.6 ml 17.38 ml/m LA Biplane Vol: 33.0 ml 20.78 ml/m  AORTIC VALVE AV Area (Vmax):    1.99 cm AV Area (Vmean):   2.07 cm AV Area (VTI):     2.07 cm AV Vmax:           147.27 cm/s AV Vmean:          104.500 cm/s AV VTI:            0.270 m AV Peak Grad:      8.7 mmHg AV Mean Grad:      4.8 mmHg LVOT Vmax:         129.02 cm/s LVOT Vmean:        95.318 cm/s LVOT VTI:          0.247 m LVOT/AV VTI ratio: 0.91  AORTA Ao Root diam: 2.00 cm MITRAL VALVE MV Area (PHT): 3.97 cm    SHUNTS MV Decel Time: 191 msec    Systemic VTI:  0.25 m MV E velocity: 74.00 cm/s  Systemic Diam: 1.70 cm MV A velocity: 71.30 cm/s MV E/A ratio:  1.04 [name] MD Electronically signed by [name] MD Signature Date/Time: 03/06/2021/10:32:44 AM    Final    ECHO TEE  Result Date: 03/09/2021     TRANSESOPHOGEAL ECHO REPORT   Patient Name:   Diamond [name] Date of Exam: 03/09/2021 Medical Rec #:  [id number]        Height:       59.0 in Accession #:    [id number]       Weight:       134.0 lb Date of Birth:  Apr 16, 1996        BSA:          1.556 m Patient Age:    24 years         BP:           235/107 mmHg Patient Gender: F                HR:           104 bpm. Exam Location:  [facility] Procedure: 2D Echo Indications:    Bacteremia 790.7 / R78.81  History:        Patient has prior history of Echocardiogram examinations, most                 recent 03/06/2021. Risk Factors:Non-Smoker, Hypertension and                 Diabetes. Pneumonia.  Sonographer:    Leavy Cella RDCS (AE) Referring Phys: [id number] [place]: The transesophogeal probe was passed without difficulty through the esophogus of the patient. Sedation performed by different physician. The patient developed no complications during the procedure. IMPRESSIONS  1. Left ventricular ejection fraction, by estimation, is 60 to 65%.  The left ventricle has normal function.  2. Right ventricular systolic function is normal. The right ventricular size is normal.  3. No left atrial/left atrial appendage thrombus was detected. The LAA emptying velocity was 50 cm/s.  4. The mitral valve is normal in structure. No evidence of mitral valve regurgitation. No evidence of mitral stenosis.  5. The aortic valve is tricuspid. Aortic valve regurgitation is not visualized. No aortic stenosis is present.  6. The inferior vena cava is normal in size with greater than 50% respiratory variability, suggesting right atrial pressure of 3 mmHg. Conclusion(s)/Recommendation(s): No evidence of vegetation/infective endocarditis on this transesophageal echocardiogram. FINDINGS  Left Ventricle: Left ventricular ejection fraction, by estimation, is 60 to 65%. The left ventricle has normal function. The left ventricular internal cavity size was normal in size. Right  Ventricle: The right ventricular size is normal. No increase in right ventricular wall thickness. Right ventricular systolic function is normal. Left Atrium: Left atrial size was normal in size. No left atrial/left atrial appendage thrombus was detected. The LAA emptying velocity was 50 cm/s. Right Atrium: Right atrial size was normal in size. Pericardium: There is no evidence of pericardial effusion. Mitral Valve: The mitral valve is normal in structure. No evidence of mitral valve regurgitation. No evidence of mitral valve stenosis. Tricuspid Valve: The tricuspid valve is normal in structure. Tricuspid valve regurgitation is mild . No evidence of tricuspid stenosis. Aortic Valve: The aortic valve is tricuspid. Aortic valve regurgitation is not visualized. No aortic stenosis is present. Pulmonic Valve: The pulmonic valve was normal in structure. Pulmonic valve regurgitation is not visualized. No evidence of pulmonic stenosis. Aorta: The aortic root is normal in size and structure. Venous: The inferior vena cava is normal in size with greater than 50% respiratory variability, suggesting right atrial pressure of 3 mmHg. IAS/Shunts: No atrial level shunt detected by color flow Doppler. [name] MD Electronically signed by [name] MD Signature Date/Time: 03/09/2021/12:13:16 PM    Final    US BIOPSY (KIDNEY)  Result Date: 03/07/2021 INDICATION: 25 year old female with a history of nephrotic syndrome EXAM: IMAGE GUIDED MEDICAL RENAL BIOPSY MEDICATIONS: None. ANESTHESIA/SEDATION: Moderate (conscious) sedation was employed during this procedure. A total of Versed 2.0 mg and Fentanyl 50 mcg was administered intravenously. Moderate Sedation Time: 13 minutes. The patient's level of consciousness and vital signs were monitored continuously by radiology nursing throughout the procedure under my direct supervision. FLUOROSCOPY TIME:  Ultrasound COMPLICATIONS: None PROCEDURE: Informed written consent was obtained  from the patient after a thorough discussion of the procedural risks, benefits and alternatives. All questions were addressed. Maximal Sterile Barrier Technique was utilized including caps, mask, sterile gowns, sterile gloves, sterile drape, hand hygiene and skin antiseptic. A timeout was performed prior to the initiation of the procedure. Patient was positioned prone position on the gantry table. Images were stored sent to PACs. Once the patient is prepped and draped in the usual sterile fashion, the skin and subcutaneous tissues overlying the left kidney were generously infiltrated 1% lidocaine for local anesthesia. Using ultrasound guidance, a 15 gauge guide needle was advanced into the lower cortex of the left kidney. Once we confirmed location of the needle tip, 2 separate 16 gauge core biopsy were achieved. Two Gel-Foam pledgets were infused with a small amount of saline. The needle was removed. Final images were stored. The patient tolerated the procedure well and remained hemodynamically stable throughout. No complications were encountered and no significant blood loss encountered. IMPRESSION: Status post image guided medical  renal biopsy of the left kidney. Signed, Dulcy Fanny. Dellia Nims, RPVI [name] Electronically Signed   By: Corrie Mckusick D.O.   On: 03/07/2021 12:38   [place] EKG SITE RITE  Result Date: 03/09/2021 If Site Rite image not attached, placement could not be confirmed due to current cardiac rhythm.   Micro Results  Recent Results (from the past 240 hour(s))  Resp Panel by RT-PCR (Flu A&B, Covid) Nasopharyngeal Swab     Status: None   Collection Time: 03/02/21  3:32 PM   Specimen: Nasopharyngeal Swab; Nasopharyngeal(NP) swabs in vial transport medium  Result Value Ref Range Status   SARS Coronavirus 2 by RT PCR NEGATIVE NEGATIVE Final    Comment: (NOTE) SARS-CoV-2 target nucleic acids are NOT DETECTED.  The SARS-CoV-2 RNA  is generally detectable in upper respiratory specimens during the acute phase of infection. The lowest concentration of SARS-CoV-2 viral copies this assay can detect is 138 copies/mL. A negative result does not preclude SARS-Cov-2 infection and should not be used as the sole basis for treatment or other patient management decisions. A negative result may occur with  improper specimen collection/handling, submission of specimen other than nasopharyngeal swab, presence of viral mutation(s) within the areas targeted by this assay, and inadequate number of viral copies(<138 copies/mL). A negative result must be combined with clinical observations, patient history, and epidemiological information. The expected result is Negative.  Fact Sheet for Patients:  EntrepreneurPulse.com.au  Fact Sheet for Healthcare Providers:  IncredibleEmployment.be  This test is no t yet approved or cleared by the [place] FDA and  has been authorized for detection and/or diagnosis of SARS-CoV-2 by FDA under an Emergency Use Authorization (EUA). This EUA will remain  in effect (meaning this test can be used) for the duration of the COVID-19 declaration under Section 564(b)(1) of the Act, 21 U.S.C.section 360bbb-3(b)(1), unless the authorization is terminated  or revoked sooner.       Influenza A by PCR NEGATIVE NEGATIVE Final   Influenza B by PCR NEGATIVE NEGATIVE Final    Comment: (NOTE) The Xpert Xpress SARS-CoV-2/FLU/RSV plus assay is intended as an aid in the diagnosis of influenza from Nasopharyngeal swab specimens and should not be used as a sole basis for treatment. Nasal washings and aspirates are unacceptable for Xpert Xpress SARS-CoV-2/FLU/RSV testing.  Fact Sheet for Patients: EntrepreneurPulse.com.au  Fact Sheet for Healthcare Providers: IncredibleEmployment.be  This test is not yet approved or cleared by the  [place] FDA and has been authorized for detection and/or diagnosis of SARS-CoV-2 by FDA under an Emergency Use Authorization (EUA). This EUA will remain in effect (meaning this test can be used) for the duration of the COVID-19 declaration under Section 564(b)(1) of the Act, 21 U.S.C. section 360bbb-3(b)(1), unless the authorization is terminated or revoked.  Performed at [facility], [address], [place], [place] [address]   Culture, blood (Routine X 2) w Reflex to ID Panel     Status: None   Collection Time: 03/03/21  4:48 PM   Specimen: BLOOD LEFT HAND  Result Value Ref Range Status   Specimen Description BLOOD LEFT HAND  Final   Special Requests   Final    Blood Culture adequate volume BOTTLES DRAWN AEROBIC AND ANAEROBIC   Culture   Final    NO GROWTH 5 DAYS Performed at [facility], [address], [place], [place] [address]    Report Status 03/08/2021 FINAL  Final  Culture, blood (Routine X 2)  w Reflex to ID Panel     Status: None   Collection Time: 03/03/21  4:54 PM   Specimen: Left Antecubital; Blood  Result Value Ref Range Status   Specimen Description LEFT ANTECUBITAL  Final   Special Requests   Final    Blood Culture adequate volume BOTTLES DRAWN AEROBIC AND ANAEROBIC   Culture   Final    NO GROWTH 5 DAYS Performed at [name], [address], [place], [place] [address]    Report Status 03/08/2021 FINAL  Final  Culture, blood (routine x 2)     Status: Abnormal   Collection Time: 03/05/21  4:31 AM   Specimen: Left Antecubital; Blood  Result Value Ref Range Status   Specimen Description   Final    LEFT ANTECUBITAL BOTTLES DRAWN AEROBIC AND ANAEROBIC Performed at [name], [address] [place], [place] [address]    Special Requests   Final    Blood Culture adequate volume Performed at [name], [address] [place], [place] [address]    Culture  Setup Time   Final    AEROBIC BOTTLE GRAM POSITIVE COCCI IN CLUSTERS CRITICAL RESULT CALLED  TO, READ BACK BY AND VERIFIED WITH: MOODY,N [id number] 03/05/2021 COLEMAN,R ANAEROBIC BOTTLE GRAM POSITIVE COCCI IN CLUSTERS RESULTS PREVIOUSLY CALLED Organism ID to follow CRITICAL RESULT CALLED TO, READ BACK BY AND VERIFIED WITH: Performed at [facility], [place] [address], [place], [place] [address]    Culture STAPHYLOCOCCUS AUREUS (A)  Final   Report Status 03/08/2021 FINAL  Final   Organism ID, Bacteria STAPHYLOCOCCUS AUREUS  Final      Susceptibility   Staphylococcus aureus - MIC*    CIPROFLOXACIN <=0.5 SENSITIVE Sensitive     ERYTHROMYCIN RESISTANT Resistant     GENTAMICIN <=0.5 SENSITIVE Sensitive     OXACILLIN <=0.25 SENSITIVE Sensitive     TETRACYCLINE <=1 SENSITIVE Sensitive     VANCOMYCIN <=0.5 SENSITIVE Sensitive     TRIMETH/SULFA <=10 SENSITIVE Sensitive     CLINDAMYCIN RESISTANT Resistant     RIFAMPIN <=0.5 SENSITIVE Sensitive     Inducible Clindamycin POSITIVE Resistant     * STAPHYLOCOCCUS AUREUS  Culture, blood (routine x 2)     Status: None   Collection Time: 03/05/21  4:31 AM   Specimen: BLOOD LEFT HAND  Result Value Ref Range Status   Specimen Description   Final    BLOOD LEFT HAND BOTTLES DRAWN AEROBIC AND ANAEROBIC   Special Requests Blood Culture adequate volume  Final   Culture   Final    NO GROWTH 5 DAYS Performed at [facility], [address] [place],  [address]    Report Status 03/10/2021 FINAL  Final  Blood Culture ID Panel (Reflexed)     Status: Abnormal   Collection Time: 03/05/21  4:31 AM  Result Value Ref Range Status   Enterococcus faecalis NOT DETECTED NOT DETECTED Final   Enterococcus Faecium NOT DETECTED NOT DETECTED Final   Listeria monocytogenes NOT DETECTED NOT DETECTED Final   Staphylococcus species DETECTED (A) NOT DETECTED Final    Comment: CRITICAL RESULT CALLED TO, READ BACK BY AND VERIFIED WITH: RN CANDICE ROSE BY MESSAN H. AT 0332 ON 03/06/2021    Staphylococcus aureus (BCID) DETECTED (A) NOT DETECTED Final    Comment:  CRITICAL RESULT CALLED TO, READ BACK BY AND VERIFIED WITH: RN CANDICE ROSE BY MESSAN H. AT 0332 ON 03/06/2021    Staphylococcus epidermidis NOT DETECTED NOT DETECTED Final   Staphylococcus lugdunensis  NOT DETECTED NOT DETECTED Final   Streptococcus species NOT DETECTED NOT DETECTED Final   Streptococcus agalactiae NOT DETECTED NOT DETECTED Final   Streptococcus pneumoniae NOT DETECTED NOT DETECTED Final   Streptococcus pyogenes NOT DETECTED NOT DETECTED Final   A.calcoaceticus-baumannii NOT DETECTED NOT DETECTED Final   Bacteroides fragilis NOT DETECTED NOT DETECTED Final   Enterobacterales NOT DETECTED NOT DETECTED Final   Enterobacter cloacae complex NOT DETECTED NOT DETECTED Final   Escherichia coli NOT DETECTED NOT DETECTED Final   Klebsiella aerogenes NOT DETECTED NOT DETECTED Final   Klebsiella oxytoca NOT DETECTED NOT DETECTED Final   Klebsiella pneumoniae NOT DETECTED NOT DETECTED Final   Proteus species NOT DETECTED NOT DETECTED Final   Salmonella species NOT DETECTED NOT DETECTED Final   Serratia marcescens NOT DETECTED NOT DETECTED Final   Haemophilus influenzae NOT DETECTED NOT DETECTED Final   Neisseria meningitidis NOT DETECTED NOT DETECTED Final   Pseudomonas aeruginosa NOT DETECTED NOT DETECTED Final   Stenotrophomonas maltophilia NOT DETECTED NOT DETECTED Final   Candida albicans NOT DETECTED NOT DETECTED Final   Candida auris NOT DETECTED NOT DETECTED Final   Candida glabrata NOT DETECTED NOT DETECTED Final   Candida krusei NOT DETECTED NOT DETECTED Final   Candida parapsilosis NOT DETECTED NOT DETECTED Final   Candida tropicalis NOT DETECTED NOT DETECTED Final   Cryptococcus neoformans/gattii NOT DETECTED NOT DETECTED Final   Meth resistant mecA/C and MREJ NOT DETECTED NOT DETECTED Final    Comment: Performed at [facility], [address] [place], [place] [address]  Culture, blood (Routine X 2) w Reflex to ID Panel     Status: None (Preliminary result)    Collection Time: 03/07/21  4:46 AM   Specimen: Left Antecubital; Blood  Result Value Ref Range Status   Specimen Description LEFT ANTECUBITAL  Final   Special Requests   Final    BOTTLES DRAWN AEROBIC AND ANAEROBIC Blood Culture adequate volume   Culture   Final    NO GROWTH 3 DAYS Performed at [facility], [address], [place], [place] [address]    Report Status PENDING  Incomplete  Culture, blood (Routine X 2) w Reflex to ID Panel     Status: None (Preliminary result)   Collection Time: 03/07/21  4:46 AM   Specimen: BLOOD LEFT HAND  Result Value Ref Range Status   Specimen Description BLOOD LEFT HAND  Final   Special Requests   Final    BOTTLES DRAWN AEROBIC AND ANAEROBIC Blood Culture adequate volume   Culture   Final    NO GROWTH 3 DAYS Performed at [name], [address] [place], [place] [address]    Report Status PENDING  Incomplete   Today   Subjective    Diamond [name] today has no new complaints, no fever no chills, no vomiting or diarrhea, -Voiding okay, no bleeding concerns          Patient has been seen and examined prior to discharge   Objective   Blood pressure (!) 146/99, pulse (!) 110, temperature 99.6 F (37.6 C), temperature source Oral, resp. rate 20, height 4' 11"  (1.499 m), weight 60.9 kg, SpO2 92 %.   Intake/Output Summary (Last 24 hours) at 03/10/2021 1242 Last data filed at 03/10/2021 0900 Gross per 24 hour  Intake 1440 ml  Output 2600 ml  Net -1160 ml    Exam Gen:- Awake Alert, no acute distress , speaking in complete sentences HEENT:- [place].AT, No sclera icterus Neck-Supple Neck,No  JVD,.  Lungs-improved air movement, no wheezing  CV- S1, S2 normal, regular Abd-  +ve B.Sounds, Abd Soft, No tenderness,    Extremity/Skin:-Improved bilateral lower extremity edema,   good pulses Psych-affect is appropriate, oriented x3 Neuro-no new focal deficits, no tremors  MSK-UE PICC line in situ   Data Review   CBC w Diff:  Lab Results   Component Value Date   WBC 8.6 03/09/2021   HGB 7.1 (L) 03/09/2021   HCT 22.9 (L) 03/09/2021   PLT 457 (H) 03/09/2021   LYMPHOPCT 16 03/02/2021   MONOPCT 7 03/02/2021   EOSPCT 5 03/02/2021   BASOPCT 1 03/02/2021    CMP:  Lab Results  Component Value Date   NA 135 03/10/2021   K 4.7 03/10/2021   CL 95 (L) 03/10/2021   CO2 29 03/10/2021   BUN 17 03/10/2021   CREATININE 1.27 (H) 03/10/2021   CREATININE 0.69 07/08/2012   PROT 5.8 (L) 03/03/2021   ALBUMIN 2.2 (L) 03/10/2021   BILITOT 0.3 03/03/2021   ALKPHOS 78 03/03/2021   AST 13 (L) 03/03/2021   ALT 11 03/03/2021  .   Total Discharge time is about 33 minutes  Roxan Hockey M.D on 03/10/2021 at 12:42 PM  Go to [email or web address] -  for contact info  [facility] - Office  [phone]

## 2021-03-10 NOTE — Progress Notes (Signed)
Peripherally Inserted Central Catheter Placement  The IV Nurse has discussed with the patient and/or persons authorized to consent for the patient, the purpose of this procedure and the potential benefits and risks involved with this procedure.  The benefits include less needle sticks, lab draws from the catheter, and the patient may be discharged home with the catheter. Risks include, but not limited to, infection, bleeding, blood clot (thrombus formation), and puncture of an artery; nerve damage and irregular heartbeat and possibility to perform a PICC exchange if needed/ordered by physician.  Alternatives to this procedure were also discussed.  Bard Power PICC patient education guide, fact sheet on infection prevention and patient information card has been provided to patient /or left at bedside.    PICC Placement Documentation  PICC Single Lumen 32/25/67 PICC Right Basilic 35 cm 0.5 cm (Active)  Indication for Insertion or Continuance of Line Home intravenous therapies (PICC only) 03/10/21 0957  Exposed Catheter (cm) 0.5 cm 03/10/21 0957  Site Assessment Clean;Dry;Intact 03/10/21 0957  Line Status Flushed;Saline locked;Blood return noted 03/10/21 0957  Dressing Type Transparent;Securing device 03/10/21 0957  Dressing Status Clean;Dry;Intact 03/10/21 0957  Antimicrobial disc in place? Yes 03/10/21 0957  Safety Lock Not Applicable 20/91/98 0221  Dressing Change Due 03/17/21 03/10/21 0957       Frances Maywood 03/10/2021, 10:01 AM

## 2021-03-10 NOTE — TOC Transition Note (Addendum)
Transition of Care [facility]) - CM/SW Discharge Note   Patient Details  Name: Diamond [name] MRN: [id number] Date of Birth: 08-09-1996  Transition of Care [facility]) CM/SW Contact:  Shade Flood, LCSW Phone Number: 03/10/2021, 12:15 PM   Clinical Narrative:     Additional TOC involvement for dc includes referral to [facility] for follow up care, [place], and confirmation that [employer] and [employer] are assisting pt with medicaid application.  Pt is already a part of the [place] and she is receiving care from Endocrinology, Dr. Dorris Fetch, as well as Gastroenterology, [facility] for primary care, and [facility] for prescription assistance. Pt is also receiving services from the [facility] Paramedic providers. It is reported that pt is not always compliant with treatment recommendations. Updated MD.   Final next level of care: [place] Barriers to Discharge: Barriers Resolved   Patient Goals and CMS Choice Patient states their goals for this hospitalization and ongoing recovery are:: return home   Choice offered to / list presented to : Patient  Discharge Placement                       Discharge Plan and Services In-house Referral: Clinical Social Work Discharge Planning Services: [place]: RN,IV Antibiotics [facility] Agency: [place] ([place]) Date [facility] Agency Contacted: 03/09/21   Representative spoke with at [place]: [place] ([place]) Interventions     Readmission Risk Interventions Readmission Risk Prevention Plan 03/07/2021 01/27/2021  Transportation Screening Complete Complete  PCP or Specialist Appt within 3-5 Days - Complete  HRI or [place] - Complete  Social Work Consult for [place] Planning/Counseling - Complete  Palliative Care Screening - Not Applicable  Medication Review Designer, fashion/clothing) Complete Complete  HRI or Home Care Consult Complete -  SW Recovery Care/Counseling Consult Complete -  Palliative Care Screening Not Applicable -  [place] Not Applicable -  Some recent data might be hidden

## 2021-03-13 ENCOUNTER — Other Ambulatory Visit: Payer: Self-pay

## 2021-03-13 ENCOUNTER — Ambulatory Visit (HOSPITAL_COMMUNITY): Payer: Self-pay | Admitting: Physical Therapy

## 2021-03-13 ENCOUNTER — Ambulatory Visit (HOSPITAL_COMMUNITY): Payer: Self-pay | Attending: Internal Medicine | Admitting: Physical Therapy

## 2021-03-13 ENCOUNTER — Encounter (HOSPITAL_COMMUNITY): Payer: Self-pay | Admitting: Physical Therapy

## 2021-03-13 DIAGNOSIS — M6281 Muscle weakness (generalized): Secondary | ICD-10-CM

## 2021-03-13 DIAGNOSIS — R2689 Other abnormalities of gait and mobility: Secondary | ICD-10-CM

## 2021-03-13 DIAGNOSIS — M545 Low back pain, unspecified: Secondary | ICD-10-CM

## 2021-03-13 LAB — CULTURE, BLOOD (ROUTINE X 2)
Culture: NO GROWTH
Culture: NO GROWTH
Special Requests: ADEQUATE
Special Requests: ADEQUATE

## 2021-03-13 NOTE — Therapy (Signed)
[facility] [address] [place], [place], [address] Phone: [phone]   Fax:  [phone]  Physical Therapy Treatment  Patient Details  Name: Diamond [name] MRN: [id number] Date of Birth: 05-28-1996 Referring Provider (PT): Pratik Manuella Ghazi DO  PHYSICAL THERAPY DISCHARGE SUMMARY  Visits from Start of Care: 6  Current functional level related to goals / functional outcomes: See below    Remaining deficits: See below    Education / Equipment: See assessment  Plan: Patient agrees to discharge.  Patient goals were not met. Patient is being discharged due to lack of progress.  ?????       Encounter Date: 03/13/2021   PT End of Session - 03/13/21 0914    Visit Number 6    Number of Visits 8    Date for PT Re-Evaluation 03/14/21    Authorization Type Self Pay    PT Start Time 0905    PT Stop Time 0945    PT Time Calculation (min) 40 min    Equipment Utilized During Treatment Gait belt    Activity Tolerance Patient limited by fatigue;Patient limited by pain    Behavior During Therapy WFL for tasks assessed/performed           Past Medical History:  Diagnosis Date  . ADHD (attention deficit hyperactivity disorder) 2007  . Allergy   . Asthma   . Bipolar disorder ([place])   . Crohn's colitis ([place])    Dx Dr. Cherrie Gauze  . Depression    early childhood, trial of seroquel?  . Diabetic neuropathy ([place])   . Edema   . GERD (gastroesophageal reflux disease)   . Hypertension   . Pancreatitis   . Panic attacks   . PTSD (post-traumatic stress disorder)   . Stroke ([place]) 07/2020   R side  . Type 1 diabetes ([place]) 2008    Past Surgical History:  Procedure Laterality Date  . ADENOIDECTOMY    . BIOPSY  02/01/2021   Procedure: BIOPSY;  Surgeon: Rogene Houston, MD;  [facility];  Service: Endoscopy;;  duodenal, esophageal,  . BIOPSY  02/03/2021   Procedure: BIOPSY;  Surgeon: Eloise Harman, DO;  [facility];  Service: Endoscopy;;  . COLONOSCOPY WITH PROPOFOL N/A 02/01/2021   Procedure: COLONOSCOPY WITH PROPOFOL;  Surgeon: Rogene Houston, MD;  [facility];  Service: Endoscopy;  Laterality: N/A;  . COLONOSCOPY WITH PROPOFOL N/A 02/03/2021   Procedure: COLONOSCOPY WITH PROPOFOL;  Surgeon: Eloise Harman, DO;  [facility];  Service: Endoscopy;  Laterality: N/A;  . ESOPHAGOGASTRODUODENOSCOPY (EGD) WITH PROPOFOL N/A 02/01/2021   Procedure: ESOPHAGOGASTRODUODENOSCOPY (EGD) WITH PROPOFOL;  Surgeon: Rogene Houston, MD;  [facility];  Service: Endoscopy;  Laterality: N/A;  . HERNIA REPAIR    . TEE WITHOUT CARDIOVERSION N/A 03/09/2021   Procedure: TRANSESOPHAGEAL ECHOCARDIOGRAM (TEE) WITH PROPOFOL;  Surgeon: Arnoldo Lenis, MD;  [facility];  Service: Endoscopy;  Laterality: N/A;  . TONSILLECTOMY      There were no vitals filed for this visit.   Subjective Assessment - 03/13/21 0912    Subjective Patient arrived for therapy today after cancelling all appoitnmetns last week due to hositlization. She says she has an infection in her blood and in her spine. She says she now has a pic line and is on restricitons for 6 weeks.    Limitations Standing;Walking;House hold activities    Currently in Pain? Yes  Pain Score 8     Pain Location Back    Pain Orientation Posterior;Lower    Pain Descriptors / Indicators Aching    Pain Type Acute pain    Pain Onset More than a month ago    Pain Frequency Constant    Aggravating Factors  getting up, walking far    Pain Relieving Factors Meds, heat    Effect of Pain on Daily Activities Limits              [facility] PT Assessment - 03/13/21 0001      Assessment   Medical Diagnosis Uncontrolled Diabetes    Referring Provider (PT) Pratik Manuella Ghazi DO    Prior Therapy Inpatient      Precautions   Precaution Comments "no lifting or climbing"      [place] residence     Living Arrangements Other relatives      Cognition   Overall Cognitive Status Within Functional Limits for tasks assessed      Strength   Right Hip Flexion 3-/5    Left Hip Flexion 3-/5    Right Knee Extension 4/5   was 3+   Left Knee Extension 3-/5    Right Ankle Dorsiflexion 3+/5    Left Ankle Dorsiflexion 3+/5      Transfers   Five time sit to stand comments  17.8 sec with UEs      Ambulation/Gait   Ambulation/Gait Yes    Ambulation/Gait Assistance 5: Supervision    Ambulation Distance (Feet) 170 Feet    Assistive device None    Gait Pattern Decreased step length - right;Decreased step length - left;Decreased stride length;Decreased dorsiflexion - right;Decreased dorsiflexion - left;Poor foot clearance - left;Poor foot clearance - right;Wide base of support    Ambulation Surface Level;Indoor                         [facility] Adult PT Treatment/Exercise - 03/13/21 0001      Knee/Hip Exercises: Standing   Stairs 1RT 4 inch step to, both rails, 1 RT 7 inch step to, both rails    Other Standing Knee Exercises supported marching x10                    PT Short Term Goals - 03/13/21 0915      PT SHORT TERM GOAL #1   Title Patient will be independent with initial HEP and self-management strategies to improve functional outcomes    Baseline Reports compliance, questionable return    Time 2    Period Weeks    Status Partially Met    Target Date 02/28/21             PT Long Term Goals - 03/13/21 0916      PT LONG TERM GOAL #1   Title Patient will be able to ambulate at least 300 feet during 2MWT with LRAD to demonstrate improved ability to perform functional mobility and associated tasks.    Baseline 170 feet with no AD    Time 4    Period Weeks    Status Not Met      PT LONG TERM GOAL #2   Title Patient will report at least 70% overall improvement in subjective complaint to indicate improvement in ability to perform ADLs.    Baseline Reports  50%    Time 4    Period Weeks    Status Not Met  PT LONG TERM GOAL #3   Title Patient will be able to perform stand x 5 in < 25 seconds to demonstrate improvement in functional mobility and reduced risk for falls.    Baseline 17.8 seconds with use of UEs    Status Not Met                 Plan - 03/13/21 1719    Clinical Impression Statement Patient shows limited progress toward therapy goals. Performed reassessment today. Patient with slight improvement with ambulatory distance but remains limited by weakness and balance issues. Patient appears to be near plateau with therapy progress within confines of current functional ability. Reviewed current HEP program and issued patient updated HEP handout for transition to independent HEP today. Patient agrees with this plan. Encouraged patient to follow up with therapy services with any further questions or concerns.    Personal Factors and Comorbidities Comorbidity 3+    Comorbidities HTN, DM I, CVA (April 2021)    Examination-Activity Limitations Bathing;Lift;Stand;Locomotion Level;Toileting;Transfers;Stairs    Examination-Participation Restrictions Laundry;Occupation;Community Activity;Cleaning;Yard Work    Stability/Clinical Decision Making Stable/Uncomplicated    Rehab Potential Fair    PT Frequency --    PT Duration --    PT Treatment/Interventions ADLs/Self Care Home Management;Aquatic Therapy;Biofeedback;Canalith Repostioning;Cryotherapy;Electrical Stimulation;Contrast Bath;Therapeutic exercise;Manual lymph drainage;Manual techniques;Functional mobility training;Neuromuscular re-education;Scar mobilization;Passive range of motion;Balance training;DME Instruction;Gait training;Iontophoresis 34m/ml Dexamethasone;Stair training;Moist Heat;Traction;Ultrasound;Parrafin;Therapeutic activities;Fluidtherapy;Orthotic Fit/Training;Compression bandaging;Dry needling;Energy conservation;Splinting;Taping;Vasopneumatic Device;Other  (comment);Joint Manipulations;Spinal Manipulations;Visual/perceptual remediation/compensation;Vestibular;Patient/family education    PT Next Visit Plan DC to HEP    PT Home Exercise Plan Eval: seated march, LAQs; 02/23/21 - QS, ab set, SAQ, ankle pump 3/30 heel raise, standing march    Consulted and Agree with Plan of Care Patient           Patient will benefit from skilled therapeutic intervention in order to improve the following deficits and impairments:  Pain,Improper body mechanics,Abnormal gait,Decreased activity tolerance,Decreased balance,Decreased safety awareness,Difficulty walking,Decreased strength,Decreased endurance  Visit Diagnosis: Other abnormalities of gait and mobility  Muscle weakness (generalized)  Low back pain, unspecified back pain laterality, unspecified chronicity, unspecified whether sciatica present     Problem List Patient Active Problem List   Diagnosis Date Noted  . 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 Multifocal pneumonia 03/05/2021  . Bladder outlet obstruction   . Constipation   . Abdominal pain, epigastric   . Abnormal computed tomography of cecum and terminal ileum   . Hyperosmolar hyperglycemic state (HHS) ([place] 01/30/2021  . Crohn's disease ([place] 12/08/2020  . Gastroparesis 12/08/2020  . Essential hypertension, benign 11/01/2020  . Edema 10/19/2020  . Anasarca 10/18/2020  . Hypoalbuminemia-due to proteinuria/nephrotic syndrome 10/18/2020  . Personal history of noncompliance with medical treatment, presenting hazards to health 10/12/2020  . Bilateral lower extremity edema 08/25/2020  . Pneumonia 08/25/2020  . Eczema 10/23/2012  . Abdominal pain 08/27/2012  . GERD (gastroesophageal reflux disease) 07/25/2012  . Mood disorder ([place] 07/09/2012  . Contraception management 07/09/2012  . History of Crohn's disease 07/09/2012  . Uncontrolled type 1 diabetes mellitus with  hyperglycemia and Diabetic Nephropathy 07/08/2012    Class: Chronic  . Asthma, persistent controlled 07/08/2012   5:43 PM, 03/13/21 CJosue HectorPT DPT  Physical [facility] [phone] Jones StreetSFort Gay [place] [address]Phone: [phone]  Fax:  [phone] Name: BAYAKO TAPANES  MRN: [id number] Date of Birth: 09-Dec-1995

## 2021-03-13 NOTE — Patient Instructions (Signed)
Access Code: [email or web address] URL: [email or web address] Date: 03/13/2021 Prepared by: Josue Hector  Exercises Standing Tandem Balance with Counter Support - 2 x daily - 7 x weekly - 1 sets - 3 reps - 20-30 seconds hold Side Stepping with Counter Support - 2 x daily - 7 x weekly - 1 sets - 10 reps Sit to Stand with Counter Support - 2 x daily - 7 x weekly - 1 sets - 10 reps

## 2021-03-14 ENCOUNTER — Telehealth: Payer: Self-pay

## 2021-03-14 NOTE — Telephone Encounter (Signed)
Called [facility] to confirm what medications were sent at recent discharge from [facility], [place]. Medications that were on formulary and available to be refilled were processed and will be sent to patient by mail.  Some Medications received were not on formulary such as  Gabapentin Metoclopramide Diphenahydramine  Client is now working with [facility] for IV antibiotics and with community paramedic with [place].  Plan: will call to follow up with patient. Assure client has transportation to upcoming appointments and that she has rescheduled with Endocrine for DM management.  Discussed in detail with [name] community paramedic with [place].  Debria Garret RN [employer]

## 2021-03-15 ENCOUNTER — Encounter (HOSPITAL_COMMUNITY): Payer: Self-pay | Admitting: Physical Therapy

## 2021-03-15 LAB — SURGICAL PATHOLOGY

## 2021-03-16 ENCOUNTER — Other Ambulatory Visit: Payer: Self-pay

## 2021-03-16 ENCOUNTER — Inpatient Hospital Stay (HOSPITAL_COMMUNITY)
Admission: EM | Admit: 2021-03-16 | Discharge: 2021-03-19 | DRG: 074 | Disposition: A | Discharge status: Home Health | Payer: Self-pay | Attending: Internal Medicine | Admitting: Internal Medicine

## 2021-03-16 ENCOUNTER — Encounter (HOSPITAL_COMMUNITY): Payer: Self-pay | Admitting: *Deleted

## 2021-03-16 ENCOUNTER — Emergency Department (HOSPITAL_COMMUNITY): Payer: Self-pay

## 2021-03-16 DIAGNOSIS — Z8249 Family history of ischemic heart disease and other diseases of the circulatory system: Secondary | ICD-10-CM

## 2021-03-16 DIAGNOSIS — K219 Gastro-esophageal reflux disease without esophagitis: Secondary | ICD-10-CM | POA: Diagnosis present

## 2021-03-16 DIAGNOSIS — Z818 Family history of other mental and behavioral disorders: Secondary | ICD-10-CM

## 2021-03-16 DIAGNOSIS — Z8673 Personal history of transient ischemic attack (TIA), and cerebral infarction without residual deficits: Secondary | ICD-10-CM

## 2021-03-16 DIAGNOSIS — I471 Supraventricular tachycardia: Secondary | ICD-10-CM | POA: Diagnosis present

## 2021-03-16 DIAGNOSIS — Z91040 Latex allergy status: Secondary | ICD-10-CM

## 2021-03-16 DIAGNOSIS — J45909 Unspecified asthma, uncomplicated: Secondary | ICD-10-CM | POA: Diagnosis present

## 2021-03-16 DIAGNOSIS — Z91048 Other nonmedicinal substance allergy status: Secondary | ICD-10-CM

## 2021-03-16 DIAGNOSIS — R109 Unspecified abdominal pain: Secondary | ICD-10-CM | POA: Diagnosis present

## 2021-03-16 DIAGNOSIS — E1021 Type 1 diabetes mellitus with diabetic nephropathy: Secondary | ICD-10-CM | POA: Diagnosis present

## 2021-03-16 DIAGNOSIS — D649 Anemia, unspecified: Secondary | ICD-10-CM | POA: Diagnosis present

## 2021-03-16 DIAGNOSIS — E1121 Type 2 diabetes mellitus with diabetic nephropathy: Secondary | ICD-10-CM | POA: Diagnosis present

## 2021-03-16 DIAGNOSIS — E1143 Type 2 diabetes mellitus with diabetic autonomic (poly)neuropathy: Secondary | ICD-10-CM | POA: Diagnosis present

## 2021-03-16 DIAGNOSIS — E1040 Type 1 diabetes mellitus with diabetic neuropathy, unspecified: Secondary | ICD-10-CM | POA: Diagnosis present

## 2021-03-16 DIAGNOSIS — E10649 Type 1 diabetes mellitus with hypoglycemia without coma: Secondary | ICD-10-CM | POA: Diagnosis present

## 2021-03-16 DIAGNOSIS — Z833 Family history of diabetes mellitus: Secondary | ICD-10-CM

## 2021-03-16 DIAGNOSIS — Z20822 Contact with and (suspected) exposure to covid-19: Secondary | ICD-10-CM | POA: Diagnosis present

## 2021-03-16 DIAGNOSIS — Z880 Allergy status to penicillin: Secondary | ICD-10-CM

## 2021-03-16 DIAGNOSIS — Z794 Long term (current) use of insulin: Secondary | ICD-10-CM

## 2021-03-16 DIAGNOSIS — R1013 Epigastric pain: Secondary | ICD-10-CM | POA: Diagnosis present

## 2021-03-16 DIAGNOSIS — M545 Low back pain, unspecified: Secondary | ICD-10-CM

## 2021-03-16 DIAGNOSIS — N049 Nephrotic syndrome with unspecified morphologic changes: Secondary | ICD-10-CM | POA: Diagnosis present

## 2021-03-16 DIAGNOSIS — F39 Unspecified mood [affective] disorder: Secondary | ICD-10-CM | POA: Diagnosis present

## 2021-03-16 DIAGNOSIS — Z888 Allergy status to other drugs, medicaments and biological substances status: Secondary | ICD-10-CM

## 2021-03-16 DIAGNOSIS — K3184 Gastroparesis: Secondary | ICD-10-CM | POA: Diagnosis present

## 2021-03-16 DIAGNOSIS — Z881 Allergy status to other antibiotic agents status: Secondary | ICD-10-CM

## 2021-03-16 DIAGNOSIS — I10 Essential (primary) hypertension: Secondary | ICD-10-CM | POA: Diagnosis present

## 2021-03-16 DIAGNOSIS — Z91018 Allergy to other foods: Secondary | ICD-10-CM

## 2021-03-16 DIAGNOSIS — E1065 Type 1 diabetes mellitus with hyperglycemia: Secondary | ICD-10-CM | POA: Diagnosis present

## 2021-03-16 DIAGNOSIS — E1043 Type 1 diabetes mellitus with diabetic autonomic (poly)neuropathy: Principal | ICD-10-CM | POA: Diagnosis present

## 2021-03-16 DIAGNOSIS — R112 Nausea with vomiting, unspecified: Secondary | ICD-10-CM

## 2021-03-16 DIAGNOSIS — Z79899 Other long term (current) drug therapy: Secondary | ICD-10-CM

## 2021-03-16 LAB — COMPREHENSIVE METABOLIC PANEL
ALT: 6 U/L (ref 0–44)
AST: 16 U/L (ref 15–41)
Albumin: 2.2 g/dL — ABNORMAL LOW (ref 3.5–5.0)
Alkaline Phosphatase: 167 U/L — ABNORMAL HIGH (ref 38–126)
Anion gap: 12 (ref 5–15)
BUN: 13 mg/dL (ref 6–20)
CO2: 26 mmol/L (ref 22–32)
Calcium: 8.6 mg/dL — ABNORMAL LOW (ref 8.9–10.3)
Chloride: 97 mmol/L — ABNORMAL LOW (ref 98–111)
Creatinine, Ser: 0.83 mg/dL (ref 0.44–1.00)
GFR, Estimated: >60 mL/min (ref >60)
Glucose, Bld: 458 mg/dL — ABNORMAL HIGH (ref 70–99)
Potassium: 4.1 mmol/L (ref 3.5–5.1)
Sodium: 135 mmol/L (ref 135–145)
Total Bilirubin: 0.6 mg/dL (ref 0.3–1.2)
Total Protein: 7.3 g/dL (ref 6.5–8.1)

## 2021-03-16 LAB — I-STAT BETA HCG BLOOD, ED (MC, WL, AP ONLY): I-stat hCG, quantitative: <5 m[IU]/mL (ref <5)

## 2021-03-16 LAB — URINALYSIS, ROUTINE W REFLEX MICROSCOPIC
Bacteria, UA: NONE SEEN
Bilirubin Urine: NEGATIVE
Glucose, UA: >=500 mg/dL — AB
Ketones, ur: 5 mg/dL — AB
Leukocytes,Ua: NEGATIVE
Nitrite: NEGATIVE
Protein, ur: >=300 mg/dL — AB
Specific Gravity, Urine: 1.011 (ref 1.005–1.030)
pH: 7 (ref 5.0–8.0)

## 2021-03-16 LAB — CBC
HCT: 30.5 % — ABNORMAL LOW (ref 36.0–46.0)
Hemoglobin: 9.6 g/dL — ABNORMAL LOW (ref 12.0–15.0)
MCH: 29.5 pg (ref 26.0–34.0)
MCHC: 31.5 g/dL (ref 30.0–36.0)
MCV: 93.8 fL (ref 80.0–100.0)
Platelets: 452 10*3/uL — ABNORMAL HIGH (ref 150–400)
RBC: 3.25 MIL/uL — ABNORMAL LOW (ref 3.87–5.11)
RDW: 15.2 % (ref 11.5–15.5)
WBC: 8.5 10*3/uL (ref 4.0–10.5)
nRBC: 0 % (ref 0.0–0.2)

## 2021-03-16 LAB — CBG MONITORING, ED: Glucose-Capillary: 312 mg/dL — ABNORMAL HIGH (ref 70–99)

## 2021-03-16 LAB — POC URINE PREG, ED: Preg Test, Ur: NEGATIVE

## 2021-03-16 LAB — LIPASE, BLOOD: Lipase: 21 U/L (ref 11–51)

## 2021-03-16 MED ORDER — HYDRALAZINE HCL 25 MG PO TABS
25.0000 mg | ORAL_TABLET | Freq: Two times a day (BID) | ORAL | Status: DC
  Administered 2021-03-16 – 2021-03-19 (×6): 25 mg via ORAL
  Filled 2021-03-16 (×6): qty 1

## 2021-03-16 MED ORDER — CEFAZOLIN IV (FOR PTA / DISCHARGE USE ONLY): 2.0000 g | Freq: Three times a day (TID) | INTRAVENOUS | Status: DC

## 2021-03-16 MED ORDER — GABAPENTIN 100 MG PO CAPS
100.0000 mg | ORAL_CAPSULE | Freq: Two times a day (BID) | ORAL | Status: DC
  Administered 2021-03-16 – 2021-03-19 (×6): 100 mg via ORAL
  Filled 2021-03-16 (×6): qty 1

## 2021-03-16 MED ORDER — METOCLOPRAMIDE HCL 5 MG/ML IJ SOLN
10.0000 mg | Freq: Once | INTRAMUSCULAR | Status: AC
  Administered 2021-03-16: 10 mg via INTRAVENOUS
  Filled 2021-03-16: qty 2

## 2021-03-16 MED ORDER — SODIUM CHLORIDE 0.9 % IV SOLN
250.0000 mg | Freq: Four times a day (QID) | INTRAVENOUS | Status: AC
  Administered 2021-03-17 – 2021-03-18 (×5): 250 mg via INTRAVENOUS
  Filled 2021-03-16 (×6): qty 5

## 2021-03-16 MED ORDER — ONDANSETRON HCL 4 MG/2ML IJ SOLN: 4.0000 mg | Freq: Four times a day (QID) | INTRAMUSCULAR | Status: DC | PRN

## 2021-03-16 MED ORDER — HYDROMORPHONE HCL 1 MG/ML IJ SOLN: 1.0000 mg | Freq: Once | INTRAMUSCULAR | Status: DC

## 2021-03-16 MED ORDER — INSULIN ASPART 100 UNIT/ML ~~LOC~~ SOLN
0.0000 [IU] | Freq: Three times a day (TID) | SUBCUTANEOUS | Status: DC
  Administered 2021-03-17: 3 [IU] via SUBCUTANEOUS

## 2021-03-16 MED ORDER — CEFAZOLIN SODIUM-DEXTROSE 2-4 GM/100ML-% IV SOLN: 2.0000 g | Freq: Three times a day (TID) | INTRAVENOUS | Status: DC

## 2021-03-16 MED ORDER — SODIUM CHLORIDE 0.9 % IV BOLUS
1000.0000 mL | Freq: Once | INTRAVENOUS | Status: AC
  Administered 2021-03-16: 1000 mL via INTRAVENOUS

## 2021-03-16 MED ORDER — LACTATED RINGERS IV SOLN: INTRAVENOUS | Status: DC

## 2021-03-16 MED ORDER — HALOPERIDOL LACTATE 5 MG/ML IJ SOLN: 5.0000 mg | Freq: Once | INTRAMUSCULAR | Status: DC

## 2021-03-16 MED ORDER — INSULIN GLARGINE 100 UNIT/ML ~~LOC~~ SOLN
35.0000 [IU] | Freq: Every day | SUBCUTANEOUS | Status: DC
  Administered 2021-03-16 – 2021-03-17 (×2): 35 [IU] via SUBCUTANEOUS
  Filled 2021-03-16 (×4): qty 0.35

## 2021-03-16 MED ORDER — ISOSORBIDE MONONITRATE ER 60 MG PO TB24
30.0000 mg | ORAL_TABLET | Freq: Every day | ORAL | Status: DC
  Administered 2021-03-17 – 2021-03-18 (×2): 30 mg via ORAL
  Filled 2021-03-16 (×2): qty 1

## 2021-03-16 MED ORDER — CITALOPRAM HYDROBROMIDE 10 MG PO TABS
10.0000 mg | ORAL_TABLET | Freq: Every day | ORAL | Status: DC
  Administered 2021-03-17 – 2021-03-19 (×3): 10 mg via ORAL
  Filled 2021-03-16 (×3): qty 1

## 2021-03-16 MED ORDER — INSULIN ASPART 100 UNIT/ML ~~LOC~~ SOLN
0.0000 [IU] | Freq: Every day | SUBCUTANEOUS | Status: DC
  Administered 2021-03-16: 4 [IU] via SUBCUTANEOUS
  Filled 2021-03-16 (×2): qty 1

## 2021-03-16 MED ORDER — BASAGLAR KWIKPEN 100 UNIT/ML ~~LOC~~ SOPN: 30.0000 [IU] | PEN_INJECTOR | Freq: Every day | SUBCUTANEOUS | Status: DC

## 2021-03-16 MED ORDER — CEFAZOLIN SODIUM-DEXTROSE 2-4 GM/100ML-% IV SOLN
2.0000 g | Freq: Once | INTRAVENOUS | Status: AC
  Administered 2021-03-16: 2 g via INTRAVENOUS
  Filled 2021-03-16: qty 100

## 2021-03-16 MED ORDER — ONDANSETRON HCL 4 MG/2ML IJ SOLN
4.0000 mg | Freq: Once | INTRAMUSCULAR | Status: AC
  Administered 2021-03-16: 4 mg via INTRAVENOUS
  Filled 2021-03-16: qty 2

## 2021-03-16 MED ORDER — METOCLOPRAMIDE HCL 5 MG/ML IJ SOLN
5.0000 mg | Freq: Three times a day (TID) | INTRAMUSCULAR | Status: AC
  Administered 2021-03-17 – 2021-03-18 (×6): 5 mg via INTRAVENOUS
  Filled 2021-03-16 (×6): qty 2

## 2021-03-16 MED ORDER — INSULIN ASPART 100 UNIT/ML ~~LOC~~ SOLN: 10.0000 [IU] | Freq: Once | SUBCUTANEOUS | Status: DC

## 2021-03-16 MED ORDER — CEFAZOLIN SODIUM-DEXTROSE 2-4 GM/100ML-% IV SOLN
2.0000 g | Freq: Three times a day (TID) | INTRAVENOUS | Status: DC
  Administered 2021-03-17 – 2021-03-19 (×7): 2 g via INTRAVENOUS
  Filled 2021-03-16 (×7): qty 100

## 2021-03-16 MED ORDER — ISOSORBIDE MONONITRATE ER 30 MG PO TB24: 30.0000 mg | ORAL_TABLET | Freq: Every day | ORAL | Status: DC

## 2021-03-16 MED ORDER — LISINOPRIL 10 MG PO TABS
10.0000 mg | ORAL_TABLET | Freq: Every day | ORAL | Status: DC
  Administered 2021-03-17 – 2021-03-19 (×3): 10 mg via ORAL
  Filled 2021-03-16 (×3): qty 1

## 2021-03-16 MED ORDER — TRAZODONE HCL 50 MG PO TABS
50.0000 mg | ORAL_TABLET | Freq: Every day | ORAL | Status: DC
  Administered 2021-03-16 – 2021-03-18 (×3): 50 mg via ORAL
  Filled 2021-03-16 (×3): qty 1

## 2021-03-16 NOTE — Progress Notes (Signed)
Pharmacy Antibiotic Note  Diamond [name] is a 25 y.o. female admitted on 03/16/2021 with bacteremia and possible discitis.   Pharmacy has been consulted for Cefazolin dosing, continuing home dose which is needed until 5/15 per previous OPAT.   Plan: Cefazolin 2gm IV q8h F/u cxs and clinical progress Monitor V/S, labs.      Temp (24hrs), Avg:99.3 F (37.4 C), Min:99.2 F (37.3 C), Max:99.3 F (37.4 C)  Recent Labs  Lab 03/10/21 0510 03/16/21 1631  WBC  --  8.5  CREATININE 1.27* 0.83    Estimated Creatinine Clearance: 83 mL/min (by C-G formula based on SCr of 0.83 mg/dL).    Allergies  Allergen Reactions  . Amoxicillin Hives    Did it involve swelling of the face/tongue/throat, SOB, or low BP? no  Did it involve sudden or severe rash/hives, skin peeling, or any reaction on the inside of your mouth or nose? no Did you need to seek medical attention at a hospital or doctor's office? no When did it last happen?unk If all above answers are "NO", may proceed with cephalosporin use.  Marland Kitchen Dexamethasone Hives and Itching  . Doxycycline Itching and Swelling  . Penicillins Hives and Itching    Did it involve swelling of the face/tongue/throat, SOB, or low BP? no  Did it involve sudden or severe rash/hives, skin peeling, or any reaction on the inside of your mouth or nose? no Did you need to seek medical attention at a hospital or doctor's office? no When did it last happen?unk If all above answers are "NO", may proceed with cephalosporin use. Did it involve swelling of the face/tongue/throat, SOB, or low BP? no  Did it involve sudden or severe rash/hives, skin peeling, or any reac  . Ceftriaxone Sodium In Dextrose Itching  . Augmentin [Amoxicillin-Pot Clavulanate] Other (See Comments)    Reaction unknown  . Clavulanic Acid   . Adhesive [Tape] Itching and Rash  . Latex Itching and Rash  . Other Itching and Rash  . Pineapple Rash    Rash on tongue and throat     Antimicrobials this admission: Levaquin 4/3 >> 4/4 Daptomycin 4/4>> 4/5 Cefazolin 4/5>>  Microbiology results: 4/3 BCx: MSSA Staphylococcus aureus (BCID) NOT DETECTED DETECTEDAbnormal   4/3 BCx: ngtd 4/5 BCX: pending  Thank you for allowing pharmacy to be a part of this patient's care.  Thomasenia Sales, PharmD, MBA, BCGP Clinical Pharmacist  03/16/2021 9:33 PM

## 2021-03-16 NOTE — ED Triage Notes (Signed)
Discharged from the hospital 5 days ago. C/o abdominal pain with vomiting since discharge

## 2021-03-16 NOTE — ED Provider Notes (Signed)
Emergency Medicine Provider Triage Evaluation Note  Diamond [name] , a 25 y.o. female  was evaluated in triage.  Pt complains of vomiting and back pain. + type 1 dm. Recently d/c with  Staph and listerial sepsis. pic line in place and taking ancef . Had staph pneumonia and concern for diskitis. Pain is in the low back and tailbone. Has had episodes of hiccup and vomiting. Able to tolerate fluids, Feeling dizzy and light headed. Hx of nephrotic syndrome and crohns. + headache r side.  Review of Systems  Positive: vomiting Negative: nausea  Physical Exam  BP (!) 152/115 (BP Location: Left Arm)   Pulse (!) 103   Temp 99.3 F (37.4 C) (Oral)   Resp (!) 22   SpO2 96%  Gen:   Awake, no distress   HEENT:  Atraumatic  Resp:  Normal effort  Cardiac:  Normal rate  Abd:   Nondistended, nontender  MSK:   Moves extremities without difficulty  Neuro:  Speech clear   Medical Decision Making  Medically screening exam initiated at 2:50 PM.  Appropriate orders placed.  KRIYA WESTRA was informed that the remainder of the evaluation will be completed by another provider, this initial triage assessment does not replace that evaluation, and the importance of remaining in the ED until their evaluation is complete.  Clinical Impression  Labs ordered, She left abx infusion in her uncle's car - ordered  may have pulled picc line cxr ordered     Diamond Mail, PA-C 03/16/21 1459    Fredia Sorrow, MD 03/17/21 [phone]

## 2021-03-16 NOTE — H&P (Addendum)
TRH H&P   Patient Demographics:    Diamond [name], is a 25 y.o. female  MRN: [id number]   DOB - 15-May-1996  Admit Date - 03/16/2021  Outpatient Primary MD for the patient is [name], PA-C  Referring MD/NP/PA: PA Sophia  Patient coming from: Home  Chief Complaint  Patient presents with  . Abdominal Pain      HPI:    Diamond [name]  is a 25 y.o. female, with past medical history of uncontrolled type 1 diabetes mellitus, with multiple admissions in the past for HHS, and hyperglycemia, anasarca, with recent hospital admission significant for staph aureus bacteremia and sepsis due to multifocal pneumonia and possible lumbar discitis, where she was discharged with PICC line to receive total 6 weeks of IV cefazolin,she presents with complains of nausea,vomiting and abdominal pain, patient reports the symptoms present since her recent discharge, she does report pain in her lower back area and tailbone, without significant change since discharge, reports vomiting, unable to tolerate any oral intake, denies fever, chills, chest pain, diarrhea or constipation. - in ED glucose was elevated at 458, but no evidence of DKA, hemoglobin at 9.6, white blood cell at 8.5, had negative urine analysis, she had intractable nausea and vomiting despite receiving Reglan and Zofran, so [facility] hospitalist consulted to admit.    Review of systems:    In addition to the HPI above,  No Fever-chills, or generalized weakness No Headache, No changes with Vision or hearing, No problems swallowing food or Liquids, No Chest pain, Cough or Shortness of Breath, She reports  Abdominal pain,  Nausea and  Vommitting, her Bowel movements are regular, No Blood in stool or Urine, No dysuria, No new skin rashes or bruises, No new joints pains-aches,  No new weakness, tingling, numbness in any extremity, No recent  weight gain or loss, No polyuria, polydypsia or polyphagia, No significant Mental Stressors.  A full 10 point Review of Systems was done, except as stated above, all other Review of Systems were negative.   With Past History of the following :    Past Medical History:  Diagnosis Date  . ADHD (attention deficit hyperactivity disorder) 2007  . Allergy   . Asthma   . Bipolar disorder ([place])   . Crohn's colitis ([place])    Dx Dr. Cherrie Gauze  . Depression    early childhood, trial of seroquel?  . Diabetic neuropathy ([place])   . Edema   . GERD (gastroesophageal reflux disease)   . Hypertension   . Pancreatitis   . Panic attacks   . PTSD (post-traumatic stress disorder)   . Stroke ([place]) 07/2020   R side  . Type 1 diabetes ([place]) 2008      Past Surgical History:  Procedure Laterality Date  . ADENOIDECTOMY    . BIOPSY  02/01/2021   Procedure: BIOPSY;  Surgeon: Rogene Houston, MD;  [facility];  Service: Endoscopy;;  duodenal, esophageal,  . BIOPSY  02/03/2021   Procedure: BIOPSY;  Surgeon: Eloise Harman, DO;  [facility];  Service: Endoscopy;;  . COLONOSCOPY WITH PROPOFOL N/A 02/01/2021   Procedure: COLONOSCOPY WITH PROPOFOL;  Surgeon: Rogene Houston, MD;  [facility];  Service: Endoscopy;  Laterality: N/A;  . COLONOSCOPY WITH PROPOFOL N/A 02/03/2021   Procedure: COLONOSCOPY WITH PROPOFOL;  Surgeon: Eloise Harman, DO;  [facility];  Service: Endoscopy;  Laterality: N/A;  . ESOPHAGOGASTRODUODENOSCOPY (EGD) WITH PROPOFOL N/A 02/01/2021   Procedure: ESOPHAGOGASTRODUODENOSCOPY (EGD) WITH PROPOFOL;  Surgeon: Rogene Houston, MD;  [facility];  Service: Endoscopy;  Laterality: N/A;  . HERNIA REPAIR    . TEE WITHOUT CARDIOVERSION N/A 03/09/2021   Procedure: TRANSESOPHAGEAL ECHOCARDIOGRAM (TEE) WITH PROPOFOL;  Surgeon: Arnoldo Lenis, MD;  [facility];  Service: Endoscopy;  Laterality: N/A;  . TONSILLECTOMY         Social History:     Social History   Tobacco Use  . Smoking status: Never Smoker  . Smokeless tobacco: Never Used  Substance Use Topics  . Alcohol use: No       Family History :     Family History  Problem Relation Age of Onset  . Hypertension Mother   . Diabetes Mother   . Heart disease Mother   . Crohn's disease Father   . Alcohol abuse Father   . Drug abuse Father   . Mental illness Father   . Cancer Father   . Hypertension Maternal Aunt   . Seizures Maternal Aunt   . Depression Maternal Aunt   . Asthma Maternal Uncle   . Hypertension Maternal Uncle   . Cancer Maternal Grandmother   . Diabetes Maternal Grandmother   . Mental illness Maternal Grandfather      Home Medications:   Prior to Admission medications   Medication Sig Start Date End Date Taking? Authorizing Provider  albuterol (VENTOLIN HFA) 108 (90 Base) MCG/ACT inhaler Inhale 2 puffs into the lungs every 6 (six) hours as needed for wheezing or shortness of breath. For shortness of breath 03/10/21  Yes Emokpae, Courage, MD  ceFAZolin (ANCEF) IVPB Inject 2 g into the vein every 8 (eight) hours. Indication:  Bacteremia, possible discitis First Dose: No Last Day of Therapy:  04/17/21 Labs - Once weekly:  CBC/D and BMP, Labs - Every other week:  ESR and CRP Method of administration: IV Push Method of administration may be changed at the discretion of home infusion pharmacist based upon assessment of the patient and/or caregiver's ability to self-administer the medication ordered. 03/10/21 04/18/21 Yes Emokpae, Courage, MD  citalopram (CELEXA) 10 MG tablet Take 10 mg by mouth daily.   Yes [provider]  diphenhydrAMINE (BENADRYL) 12.5 MG/5ML elixir Take 10 mLs (25 mg total) by mouth every 6 (six) hours as needed for itching, allergies or sleep. 03/10/21  Yes Emokpae, Courage, MD  gabapentin (NEURONTIN) 100 MG capsule Take 1 capsule (100 mg total) by mouth 2 (two) times daily. 03/10/21  Yes Emokpae,  Courage, MD  hydrALAZINE (APRESOLINE) 25 MG tablet Take 1 tablet (25 mg total) by mouth 2 (two) times daily. 03/10/21  Yes Emokpae, Courage, MD  Insulin Glargine (BASAGLAR KWIKPEN) 100 UNIT/ML Inject 45 Units into the skin at bedtime. 03/13/21  Yes [provider]  insulin lispro (HUMALOG) 100 UNIT/ML KwikPen Inject 10-16 Units into the skin 3 (three) times daily  before meals. 03/10/21 04/09/21 Yes Emokpae, Courage, MD  isosorbide mononitrate (IMDUR) 30 MG 24 hr tablet Take 1 tablet (30 mg total) by mouth daily. 03/11/21  Yes Emokpae, Courage, MD  lisinopril (ZESTRIL) 10 MG tablet Take 1 tablet (10 mg total) by mouth daily. For Renal Protection/Proteinuria 03/10/21  Yes Roxan Hockey, MD  metoCLOPramide (REGLAN) 5 MG tablet Take 1 tablet (5 mg total) by mouth every 8 (eight) hours as needed for refractory nausea / vomiting (refractory symptoms). 02/03/21 02/03/22 Yes Barton Dubois, MD  omeprazole (PRILOSEC) 40 MG capsule Take 1 capsule (40 mg total) by mouth daily. 03/10/21  Yes Emokpae, Courage, MD  ondansetron (ZOFRAN ODT) 8 MG disintegrating tablet Take 1 tablet (8 mg total) by mouth every 8 (eight) hours as needed for nausea or vomiting. 03/10/21  Yes Emokpae, Courage, MD  traZODone (DESYREL) 50 MG tablet Take 1 tablet (50 mg total) by mouth at bedtime. 03/10/21  Yes Roxan Hockey, MD     Allergies:     Allergies  Allergen Reactions  . Amoxicillin Hives    Did it involve swelling of the face/tongue/throat, SOB, or low BP? no  Did it involve sudden or severe rash/hives, skin peeling, or any reaction on the inside of your mouth or nose? no Did you need to seek medical attention at a hospital or doctor's office? no When did it last happen?unk If all above answers are "NO", may proceed with cephalosporin use.  Marland Kitchen Dexamethasone Hives and Itching  . Doxycycline Itching and Swelling  . Penicillins Hives and Itching    Did it involve swelling of the face/tongue/throat, SOB, or low BP? no  Did  it involve sudden or severe rash/hives, skin peeling, or any reaction on the inside of your mouth or nose? no Did you need to seek medical attention at a hospital or doctor's office? no When did it last happen?unk If all above answers are "NO", may proceed with cephalosporin use. Did it involve swelling of the face/tongue/throat, SOB, or low BP? no  Did it involve sudden or severe rash/hives, skin peeling, or any reac  . Ceftriaxone Sodium In Dextrose Itching  . Augmentin [Amoxicillin-Pot Clavulanate] Other (See Comments)    Reaction unknown  . Clavulanic Acid   . Adhesive [Tape] Itching and Rash  . Latex Itching and Rash  . Other Itching and Rash  . Pineapple Rash    Rash on tongue and throat     Physical Exam:   Vitals  Blood pressure (!) 116/93, pulse (!) 108, temperature 99.2 F (37.3 C), temperature source Oral, resp. rate 16, SpO2 98 %.   1. General chroniclly ill during female, laying in bed in mild discomfort secondary to nausea.  2. Normal affect and insight, Not Suicidal or Homicidal, Awake Alert, Oriented X 3.  3. No F.N deficits, ALL C.Nerves Intact, Strength 5/5 all 4 extremities, Sensation intact all 4 extremities, Plantars down going.  4. Ears and Eyes appear Normal, Conjunctivae clear, PERRLA. Moist Oral Mucosa.  5. Supple Neck, No JVD, No cervical lymphadenopathy appriciated, No Carotid Bruits.  6. Symmetrical Chest wall movement, Good air movement bilaterally, CTAB.  7. tachycardic, No Gallops, Rubs or Murmurs, No Parasternal Heave.  8. Positive Bowel Sounds, Abdomen Soft, some epigastric  tenderness, No organomegaly appriciated,No rebound -guarding or rigidity.  9.  No Cyanosis, Normal Skin Turgor, No Skin Rash or Bruise.  10. Good muscle tone,  joints appear normal , no effusions, Normal ROM.  He does have tenderness to palpation in  lumbar area  11. No Palpable Lymph Nodes in Neck or Axillae    Data Review:    CBC Recent Labs  Lab  03/16/21 1631  WBC 8.5  HGB 9.6*  HCT 30.5*  PLT 452*  MCV 93.8  MCH 29.5  MCHC 31.5  RDW 15.2   ------------------------------------------------------------------------------------------------------------------  Chemistries  Recent Labs  Lab 03/10/21 0510 03/16/21 1631  NA 135 135  K 4.7 4.1  CL 95* 97*  CO2 29 26  GLUCOSE 327* 458*  BUN 17 13  CREATININE 1.27* 0.83  CALCIUM 8.7* 8.6*  AST  --  16  ALT  --  6  ALKPHOS  --  167*  BILITOT  --  0.6   ------------------------------------------------------------------------------------------------------------------ estimated creatinine clearance is 83 mL/min (by C-G formula based on SCr of 0.83 mg/dL). ------------------------------------------------------------------------------------------------------------------ No results for input(s): TSH, T4TOTAL, T3FREE, THYROIDAB in the last 72 hours.  Invalid input(s): FREET3  Coagulation profile No results for input(s): INR, PROTIME in the last 168 hours. ------------------------------------------------------------------------------------------------------------------- No results for input(s): DDIMER in the last 72 hours. -------------------------------------------------------------------------------------------------------------------  Cardiac Enzymes No results for input(s): CKMB, TROPONINI, MYOGLOBIN in the last 168 hours.  Invalid input(s): CK ------------------------------------------------------------------------------------------------------------------    Component Value Date/Time   BNP 89.0 03/02/2021 1419     ---------------------------------------------------------------------------------------------------------------  Urinalysis    Component Value Date/Time   COLORURINE STRAW (A) 03/16/2021 1631   APPEARANCEUR CLEAR 03/16/2021 1631   APPEARANCEUR Clear 11/15/2020 1531   LABSPEC 1.011 03/16/2021 1631   PHURINE 7.0 03/16/2021 1631   GLUCOSEU >=500 (A)  03/16/2021 1631   HGBUR SMALL (A) 03/16/2021 1631   BILIRUBINUR NEGATIVE 03/16/2021 1631   BILIRUBINUR NEG 12/05/2020 1353   BILIRUBINUR Negative 11/15/2020 1531   KETONESUR 5 (A) 03/16/2021 1631   PROTEINUR >=300 (A) 03/16/2021 1631   UROBILINOGEN 0.2 12/05/2020 1353   UROBILINOGEN 0.2 09/13/2012 2014   NITRITE NEGATIVE 03/16/2021 1631   LEUKOCYTESUR NEGATIVE 03/16/2021 1631    ----------------------------------------------------------------------------------------------------------------   Imaging Results:    DG Chest 1 View  Result Date: 03/16/2021 CLINICAL DATA:  Right-sided PICC placement, abdominal pain EXAM: CHEST  1 VIEW COMPARISON:  03/04/2021 FINDINGS: Single frontal view of the chest demonstrates right-sided PICC tip overlying superior vena cava. The cardiac silhouette is unremarkable. Improved aeration at the lung bases, with minimal residual bibasilar consolidation. No effusion or pneumothorax. IMPRESSION: 1. Unremarkable right-sided PICC. 2. Improving bibasilar consolidation which may reflect resolving pneumonia or atelectasis. Electronically Signed   By: Randa Ngo M.D.   On: 03/16/2021 15:30      Assessment & Plan:    Active Problems:   Mood disorder (HCC)   GERD (gastroesophageal reflux disease)   Abdominal pain   Essential hypertension, benign   Gastroparesis   Abdominal pain, epigastric   DM1 with Diabetic nephropathy with proteinuria and Nephrotic Syndrome-   Intractable nausea and vomiting -This is most likely due to gastroparesis, she is with significant nausea and vomiting,. -We will keep on scheduled IV  Reglan X 6 doses, and scheduled IV erythromycin X 6 doses. -We will keep on clear liquid diet, advance as tolerated, will resume her on status post dose Juliane for appetite. -We will keep on aggressive IV fluid hydration. -lipase within normal limits.  Recent admission for sepsis due to Staph Aureus Multifocal pneumonia and  bacteremia with  clinical concerns for possible diskitis (MRI is neg)  - ID recommended IV Ancef for6 weeks previous admission, to continue during this hospital stay.  Nephrotic Syndrome  -with anasarca and  proteinuria -Continue with lisinopril -To follow with neurology as an outpatient  Type 1 DM uncontrolled with renal complications and hyperglycemia - no concern of DKA, will resume insulin at a lower dose given poor oral intake, will add insulin sliding scale.   -Continue with IV fluids   HTN -resume home meds.  Normocytic anemia - at base line.    DVT Prophylaxis Heparin  AM Labs Ordered, also please review Full Orders  Family Communication: Admission, patients condition and plan of care including tests being ordered have been discussed with the patient who indicate understanding and agree with the plan and Code Status.  Code Status Full  Likely DC to  Home  Condition GUARDED   Consults called: none    Admission status: inpatient    Time spent in minutes : 65 minutes   Phillips Climes M.D on 03/16/2021 at 9:31 PM   [facility] - Office  [phone]

## 2021-03-16 NOTE — ED Notes (Signed)
Pt reports she receives benadryl before ceFAZolin, benadryl is listed in PTA meds, Dr. Keturah Barre. Elgergawy made aware, next dose ordered for 0600, pt has erythromycin ordered at 0000, no pre-medications ordered, no allergy to erythromycin listed, questioned provider of any pre-medications necessary. No orders placed at this time. Awaiting any feedback or necessary orders from MD.

## 2021-03-16 NOTE — ED Provider Notes (Signed)
[facility] EMERGENCY DEPARTMENT Provider Note   CSN: [id number] Arrival date & time: 03/16/21  1306     History Chief Complaint  Patient presents with  . Abdominal Pain    Diamond [name] is a 25 y.o. female.  The history is provided by the patient. No language interpreter was used.  Abdominal Pain Pain location:  Generalized Pain quality: aching   Pain severity:  Moderate Onset quality:  Gradual Progression:  Worsening Chronicity:  Recurrent Relieved by:  Nothing Ineffective treatments:  None tried Associated symptoms: nausea    Pt is on Ancef at home due to staph and listerial sepsis.  Pt had a staph pneumonia  Pt complains of increasing back pain.  Pt was told she could have an infection in her back,  Pt had mRi and evaluation by Dr. Tommy Medal     Past Medical History:  Diagnosis Date  . ADHD (attention deficit hyperactivity disorder) 2007  . Allergy   . Asthma   . Bipolar disorder ([place])   . Crohn's colitis ([place])    Dx Dr. Cherrie Gauze  . Depression    early childhood, trial of seroquel?  . Diabetic neuropathy ([place])   . Edema   . GERD (gastroesophageal reflux disease)   . Hypertension   . Pancreatitis   . Panic attacks   . PTSD (post-traumatic stress disorder)   . Stroke ([place]) 07/2020   R side  . Type 1 diabetes ([place]) 2008    Patient Active Problem List   Diagnosis Date Noted  . 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 Multifocal pneumonia 03/05/2021  . Bladder outlet obstruction   . Constipation   . Abdominal pain, epigastric   . Abnormal computed tomography of cecum and terminal ileum   . Hyperosmolar hyperglycemic state (HHS) ([place]) 01/30/2021  . Crohn's disease ([place]) 12/08/2020  . Gastroparesis 12/08/2020  . Essential hypertension, benign 11/01/2020  . Edema 10/19/2020  . Anasarca 10/18/2020  . Hypoalbuminemia-due to proteinuria/nephrotic syndrome 10/18/2020  . Personal  history of noncompliance with medical treatment, presenting hazards to health 10/12/2020  . Bilateral lower extremity edema 08/25/2020  . Pneumonia 08/25/2020  . Eczema 10/23/2012  . Abdominal pain 08/27/2012  . GERD (gastroesophageal reflux disease) 07/25/2012  . Mood disorder ([place]) 07/09/2012  . Contraception management 07/09/2012  . History of Crohn's disease 07/09/2012  . Uncontrolled type 1 diabetes mellitus with hyperglycemia and Diabetic Nephropathy 07/08/2012    Class: Chronic  . Asthma, persistent controlled 07/08/2012    Past Surgical History:  Procedure Laterality Date  . ADENOIDECTOMY    . BIOPSY  02/01/2021   Procedure: BIOPSY;  Surgeon: Rogene Houston, MD;  [facility];  Service: Endoscopy;;  duodenal, esophageal,  . BIOPSY  02/03/2021   Procedure: BIOPSY;  Surgeon: Eloise Harman, DO;  Location: [facility];  Service: Endoscopy;;  . COLONOSCOPY WITH PROPOFOL N/A 02/01/2021   Procedure: COLONOSCOPY WITH PROPOFOL;  Surgeon: Rogene Houston, MD;  Location[facility];  Service: Endoscopy;  Laterality: N/A;  . COLONOSCOPY WITH PROPOFOL N/A 02/03/2021   Procedure: COLONOSCOPY WITH PROPOFOL;  Surgeon: Eloise Harman, DO;  [facility];  Service: Endoscopy;  Laterality: N/A;  . ESOPHAGOGASTRODUODENOSCOPY (EGD) WITH PROPOFOL N/A 02/01/2021   Procedure: ESOPHAGOGASTRODUODENOSCOPY (EGD) WITH PROPOFOL;  Surgeon: Rogene Houston, MD;  [facility];  Service: Endoscopy;  Laterality: N/A;  . HERNIA REPAIR    .  TEE WITHOUT CARDIOVERSION N/A 03/09/2021   Procedure: TRANSESOPHAGEAL ECHOCARDIOGRAM (TEE) WITH PROPOFOL;  Surgeon: Arnoldo Lenis, MD;  Location[facility];  Service: Endoscopy;  Laterality: N/A;  . TONSILLECTOMY       OB History   No obstetric history on file.     Family History  Problem Relation Age of Onset  . Hypertension Mother   . Diabetes Mother   . Heart disease Mother   . Crohn's disease Father   . Alcohol abuse  Father   . Drug abuse Father   . Mental illness Father   . Cancer Father   . Hypertension Maternal Aunt   . Seizures Maternal Aunt   . Depression Maternal Aunt   . Asthma Maternal Uncle   . Hypertension Maternal Uncle   . Cancer Maternal Grandmother   . Diabetes Maternal Grandmother   . Mental illness Maternal Grandfather     Social History   Tobacco Use  . Smoking status: Never Smoker  . Smokeless tobacco: Never Used  Vaping Use  . Vaping Use: Former  Substance Use Topics  . Alcohol use: No  . Drug use: No    Home Medications Prior to Admission medications   Medication Sig Start Date End Date Taking? Authorizing Provider  albuterol (VENTOLIN HFA) 108 (90 Base) MCG/ACT inhaler Inhale 2 puffs into the lungs every 6 (six) hours as needed for wheezing or shortness of breath. For shortness of breath 03/10/21  Yes Emokpae, Courage, MD  ceFAZolin (ANCEF) IVPB Inject 2 g into the vein every 8 (eight) hours. Indication:  Bacteremia, possible discitis First Dose: No Last Day of Therapy:  04/17/21 Labs - Once weekly:  CBC/D and BMP, Labs - Every other week:  ESR and CRP Method of administration: IV Push Method of administration may be changed at the discretion of home infusion pharmacist based upon assessment of the patient and/or caregiver's ability to self-administer the medication ordered. 03/10/21 04/18/21 Yes Emokpae, Courage, MD  citalopram (CELEXA) 10 MG tablet Take 10 mg by mouth daily.   Yes [provider]  diphenhydrAMINE (BENADRYL) 12.5 MG/5ML elixir Take 10 mLs (25 mg total) by mouth every 6 (six) hours as needed for itching, allergies or sleep. 03/10/21  Yes Emokpae, Courage, MD  gabapentin (NEURONTIN) 100 MG capsule Take 1 capsule (100 mg total) by mouth 2 (two) times daily. 03/10/21  Yes Emokpae, Courage, MD  hydrALAZINE (APRESOLINE) 25 MG tablet Take 1 tablet (25 mg total) by mouth 2 (two) times daily. 03/10/21  Yes Emokpae, Courage, MD  Insulin Glargine (BASAGLAR  KWIKPEN) 100 UNIT/ML Inject 45 Units into the skin at bedtime. 03/13/21  Yes [provider]  insulin lispro (HUMALOG) 100 UNIT/ML KwikPen Inject 10-16 Units into the skin 3 (three) times daily before meals. 03/10/21 04/09/21 Yes Emokpae, Courage, MD  isosorbide mononitrate (IMDUR) 30 MG 24 hr tablet Take 1 tablet (30 mg total) by mouth daily. 03/11/21  Yes Emokpae, Courage, MD  lisinopril (ZESTRIL) 10 MG tablet Take 1 tablet (10 mg total) by mouth daily. For Renal Protection/Proteinuria 03/10/21  Yes Roxan Hockey, MD  metoCLOPramide (REGLAN) 5 MG tablet Take 1 tablet (5 mg total) by mouth every 8 (eight) hours as needed for refractory nausea / vomiting (refractory symptoms). 02/03/21 02/03/22 Yes Barton Dubois, MD  omeprazole (PRILOSEC) 40 MG capsule Take 1 capsule (40 mg total) by mouth daily. 03/10/21  Yes Emokpae, Courage, MD  ondansetron (ZOFRAN ODT) 8 MG disintegrating tablet Take 1 tablet (8 mg total) by mouth  every 8 (eight) hours as needed for nausea or vomiting. 03/10/21  Yes Emokpae, Courage, MD  traZODone (DESYREL) 50 MG tablet Take 1 tablet (50 mg total) by mouth at bedtime. 03/10/21  Yes Roxan Hockey, MD    Allergies    Amoxicillin, Dexamethasone, Doxycycline, Penicillins, Ceftriaxone sodium in dextrose, Augmentin [amoxicillin-pot clavulanate], Clavulanic acid, Adhesive [tape], Latex, Other, and Pineapple  Review of Systems   Review of Systems  Gastrointestinal: Positive for abdominal pain and nausea.  All other systems reviewed and are negative.   Physical Exam Updated Vital Signs BP (!) 116/93   Pulse (!) 108   Temp 99.3 F (37.4 C) (Oral)   Resp (!) 22   SpO2 98%   Physical Exam Vitals and nursing note reviewed.  Constitutional:      Appearance: She is well-developed.  HENT:     Head: Normocephalic.  Eyes:     Extraocular Movements: Extraocular movements intact.  Cardiovascular:     Rate and Rhythm: Tachycardia present.     Heart sounds: Normal heart sounds.   Pulmonary:     Effort: Pulmonary effort is normal.  Abdominal:     General: Abdomen is flat. Bowel sounds are normal. There is no distension.     Palpations: Abdomen is soft.  Musculoskeletal:        General: Normal range of motion.     Cervical back: Normal range of motion.  Skin:    General: Skin is warm.  Neurological:     General: No focal deficit present.     Mental Status: She is alert and oriented to person, place, and time.  Psychiatric:        Mood and Affect: Mood normal.     ED Results / Procedures / Treatments   Labs (all labs ordered are listed, but only abnormal results are displayed) Labs Reviewed  COMPREHENSIVE METABOLIC PANEL - Abnormal; Notable for the following components:      Result Value   Chloride 97 (*)    Glucose, Bld 458 (*)    Calcium 8.6 (*)    Albumin 2.2 (*)    Alkaline Phosphatase 167 (*)    All other components within normal limits  CBC - Abnormal; Notable for the following components:   RBC 3.25 (*)    Hemoglobin 9.6 (*)    HCT 30.5 (*)    Platelets 452 (*)    All other components within normal limits  URINALYSIS, ROUTINE W REFLEX MICROSCOPIC - Abnormal; Notable for the following components:   Color, Urine STRAW (*)    Glucose, UA >=500 (*)    Hgb urine dipstick SMALL (*)    Ketones, ur 5 (*)    Protein, ur >=300 (*)    All other components within normal limits  LIPASE, BLOOD  POC URINE PREG, ED  I-STAT BETA HCG BLOOD, ED ([facility], [facility], [facility] ONLY)    EKG None  Radiology DG Chest 1 View  Result Date: 03/16/2021 CLINICAL DATA:  Right-sided PICC placement, abdominal pain EXAM: CHEST  1 VIEW COMPARISON:  03/04/2021 FINDINGS: Single frontal view of the chest demonstrates right-sided PICC tip overlying superior vena cava. The cardiac silhouette is unremarkable. Improved aeration at the lung bases, with minimal residual bibasilar consolidation. No effusion or pneumothorax. IMPRESSION: 1. Unremarkable right-sided PICC. 2. Improving bibasilar  consolidation which may reflect resolving pneumonia or atelectasis. Electronically Signed   By: Randa Ngo M.D.   On: 03/16/2021 15:30    Procedures Procedures   Medications Ordered in ED  Medications  ceFAZolin (ANCEF) IVPB 2g/100 mL premix (0 g Intravenous Stopped 03/16/21 1703)  ondansetron (ZOFRAN) injection 4 mg (4 mg Intravenous Given 03/16/21 1631)  sodium chloride 0.9 % bolus 1,000 mL (1,000 mLs Intravenous New Bag/Given 03/16/21 1836)    ED Course  I have reviewed the triage vital signs and the nursing notes.  Pertinent labs & imaging results that were available during my care of the patient were reviewed by me and considered in my medical decision making (see chart for details).    MDM Rules/Calculators/A&P                          MDM:  Pt given IV fluids, zofran and dosage of ancef.  Pt has continued vomiting and nausea.  Pt remain tachycardic after 1 liter of fluids. Second liter Iv fluids ordered.  Pt complaining of severe back pain.  Pt given Regaln IV.  Dilaudid ordered.   I spoke to Hospitalist about admission.  Dr. Waldron Labs who will see and admit Final Clinical Impression(s) / ED Diagnoses Final diagnoses:  Nausea and vomiting, intractability of vomiting not specified, unspecified vomiting type  Low back pain without sciatica, unspecified back pain laterality, unspecified chronicity    Rx / DC Orders ED Discharge Orders    None       Sidney Ace 03/16/21 2122    Noemi Chapel, MD 03/23/21 1415

## 2021-03-16 NOTE — ED Notes (Signed)
PICC line appropriate to use per Dr. Hazle Nordmann

## 2021-03-17 DIAGNOSIS — I10 Essential (primary) hypertension: Secondary | ICD-10-CM

## 2021-03-17 DIAGNOSIS — R1013 Epigastric pain: Secondary | ICD-10-CM

## 2021-03-17 DIAGNOSIS — K3184 Gastroparesis: Secondary | ICD-10-CM

## 2021-03-17 DIAGNOSIS — R112 Nausea with vomiting, unspecified: Secondary | ICD-10-CM

## 2021-03-17 DIAGNOSIS — E1121 Type 2 diabetes mellitus with diabetic nephropathy: Secondary | ICD-10-CM

## 2021-03-17 LAB — BASIC METABOLIC PANEL
Anion gap: 9 (ref 5–15)
BUN: 10 mg/dL (ref 6–20)
CO2: 24 mmol/L (ref 22–32)
Calcium: 7.9 mg/dL — ABNORMAL LOW (ref 8.9–10.3)
Chloride: 103 mmol/L (ref 98–111)
Creatinine, Ser: 0.84 mg/dL (ref 0.44–1.00)
GFR, Estimated: >60 mL/min (ref >60)
Glucose, Bld: 190 mg/dL — ABNORMAL HIGH (ref 70–99)
Potassium: 3.8 mmol/L (ref 3.5–5.1)
Sodium: 136 mmol/L (ref 135–145)

## 2021-03-17 LAB — CBC
HCT: 27.1 % — ABNORMAL LOW (ref 36.0–46.0)
Hemoglobin: 8.3 g/dL — ABNORMAL LOW (ref 12.0–15.0)
MCH: 29.2 pg (ref 26.0–34.0)
MCHC: 30.6 g/dL (ref 30.0–36.0)
MCV: 95.4 fL (ref 80.0–100.0)
Platelets: 394 10*3/uL (ref 150–400)
RBC: 2.84 MIL/uL — ABNORMAL LOW (ref 3.87–5.11)
RDW: 15.2 % (ref 11.5–15.5)
WBC: 6.9 10*3/uL (ref 4.0–10.5)
nRBC: 0 % (ref 0.0–0.2)

## 2021-03-17 LAB — HEMOGLOBIN A1C
Hgb A1c MFr Bld: 10.4 % — ABNORMAL HIGH (ref 4.8–5.6)
Mean Plasma Glucose: 252 mg/dL

## 2021-03-17 LAB — GLUCOSE, CAPILLARY
Glucose-Capillary: 166 mg/dL — ABNORMAL HIGH (ref 70–99)
Glucose-Capillary: 155 mg/dL — ABNORMAL HIGH (ref 70–99)
Glucose-Capillary: 76 mg/dL (ref 70–99)
Glucose-Capillary: 91 mg/dL (ref 70–99)
Glucose-Capillary: 115 mg/dL — ABNORMAL HIGH (ref 70–99)

## 2021-03-17 LAB — SARS CORONAVIRUS 2 (TAT 6-24 HRS): SARS Coronavirus 2: NEGATIVE

## 2021-03-17 MED ORDER — DIPHENHYDRAMINE HCL 12.5 MG/5ML PO ELIX
25.0000 mg | ORAL_SOLUTION | Freq: Three times a day (TID) | ORAL | Status: DC | PRN
  Administered 2021-03-17 – 2021-03-18 (×5): 25 mg via ORAL
  Filled 2021-03-17 (×8): qty 10

## 2021-03-17 MED ORDER — HEPARIN SODIUM (PORCINE) 5000 UNIT/ML IJ SOLN
5000.0000 [IU] | Freq: Three times a day (TID) | INTRAMUSCULAR | Status: DC
  Administered 2021-03-17 – 2021-03-19 (×6): 5000 [IU] via SUBCUTANEOUS
  Filled 2021-03-17 (×6): qty 1

## 2021-03-17 MED ORDER — ACETAMINOPHEN 325 MG PO TABS
650.0000 mg | ORAL_TABLET | Freq: Four times a day (QID) | ORAL | Status: DC | PRN
  Administered 2021-03-17 – 2021-03-19 (×2): 650 mg via ORAL
  Filled 2021-03-17 (×2): qty 2

## 2021-03-17 MED ORDER — CHLORHEXIDINE GLUCONATE CLOTH 2 % EX PADS
6.0000 | MEDICATED_PAD | Freq: Every day | CUTANEOUS | Status: DC
  Administered 2021-03-17 – 2021-03-19 (×3): 6 via TOPICAL

## 2021-03-17 NOTE — Progress Notes (Signed)
PROGRESS NOTE    Diamond [name]  IHK:[id number] DOB: 1996/08/14 DOA: 03/16/2021 PCP: [name], PA-C  Chief Complaint  Patient presents with  . Abdominal Pain    Brief admission narrative:  As per H&P written by Dr. Waldron Labs on 03/16/21 Diamond [name]  is a 25 y.o. female, with past medical history of uncontrolled type 1 diabetes mellitus, with multiple admissions in the past for HHS, and hyperglycemia, anasarca, with recent hospital admission significant for staph aureus bacteremia and sepsis due to multifocal pneumonia and possible lumbar discitis, where she was discharged with PICC line to receive total 6 weeks of IV cefazolin,she presents with complains of nausea,vomiting and abdominal pain, patient reports the symptoms present since her recent discharge, she does report pain in her lower back area and tailbone, without significant change since discharge, reports vomiting, unable to tolerate any oral intake, denies fever, chills, chest pain, diarrhea or constipation. - in ED glucose was elevated at 458, but no evidence of DKA, hemoglobin at 9.6, white blood cell at 8.5, had negative urine analysis, she had intractable nausea and vomiting despite receiving Reglan and Zofran, so [facility] hospitalist consulted to admit.  Assessment & Plan: 1-intractable nausea and vomiting in the setting of gastroparesis -Currently no vomiting -Still complaining of nausea -Continue as needed antiemetics -Continue the use of Reglan erythromycin. -Continue advancing diet as tolerated.   -Continue to maintain adequate hydration.  2-recent admission secondary to systolic heart use multifocal pneumonia and bacteremia -Continue treatment with IV Ancef  -Patient is afebrile -Resume IV antibiotics as an outpatient as per OPAT at discharge  3-GERD -will continue PPI  4-Essential hypertension, benign -continue current antihypertensive regimen  5-DM1 with Diabetic nephropathy with proteinuria and  Nephrotic Syndrome- -continue SSI and adjusted dose of long acting insulin -follow CBG's fluctuation -continue lisinopril -follow up with nephrology as an outpatient   DVT prophylaxis: heparin. Code Status: Full Code. Family Communication: no family at bedside. Disposition:   Status is: Inpatient  Dispo: The patient is from: home              Anticipated d/c is to: home              Patient currently still with ongoing intermittent nausea; will continue IVF's, antiemetics and slowly advancing diet.    Difficult to place patient no    Consultants:   None   Procedures:  See below for x-ray reports.   Antimicrobials:  IV Ancef   Subjective: Afebrile, still complaining of nausea, no active vomiting.  Denies chest pain and shortness of breath.  Objective: Vitals:   03/17/21 0038 03/17/21 0554 03/17/21 1032 03/17/21 1436  BP: (!) 141/93 131/87 121/85 121/71  Pulse: (!) 109 (!) 103 (!) 104 (!) 102  Resp: 18 18  20   Temp: 99 F (37.2 C) 99.8 F (37.7 C)  98 F (36.7 C)  TempSrc: Oral Oral    SpO2: 100% 95%  (!) 87%    Intake/Output Summary (Last 24 hours) at 03/17/2021 1612 Last data filed at 03/17/2021 1500 Gross per 24 hour  Intake 1858.36 ml  Output --  Net 1858.36 ml   There were no vitals filed for this visit.  Examination:  General exam: Drowsy and lethargic; continue complaining with nausea, no active vomiting currently. Respiratory system: Clear to auscultation. Respiratory effort normal. No using accessory muscles. PRN 2L used while sleeping. Cardiovascular system: S1 & S2 heard, RRR. No JVD, murmurs, rubs, gallops or clicks. No pedal edema. Gastrointestinal system: Abdomen  is nondistended, soft and nontender.positive BS. Central nervous system: Alert and oriented. No focal neurological deficits. Extremities: no cyanosis, no clubbing.  Skin: No petechiae.  Psychiatry:  Mood & affect appropriate.     Data Reviewed: I have personally reviewed  following labs and imaging studies  CBC: Recent Labs  Lab 03/16/21 1631 03/17/21 0519  WBC 8.5 6.9  HGB 9.6* 8.3*  HCT 30.5* 27.1*  MCV 93.8 95.4  PLT 452* 751    Basic Metabolic Panel: Recent Labs  Lab 03/16/21 1631 03/17/21 0519  NA 135 136  K 4.1 3.8  CL 97* 103  CO2 26 24  GLUCOSE 458* 190*  BUN 13 10  CREATININE 0.83 0.84  CALCIUM 8.6* 7.9*    GFR: Estimated Creatinine Clearance: 82 mL/min (by C-G formula based on SCr of 0.84 mg/dL).  Liver Function Tests: Recent Labs  Lab 03/16/21 1631  AST 16  ALT 6  ALKPHOS 167*  BILITOT 0.6  PROT 7.3  ALBUMIN 2.2*    CBG: Recent Labs  Lab 03/10/21 2058 03/16/21 2139 03/17/21 0553 03/17/21 0745 03/17/21 1144  GLUCAP 218* 312* 166* 155* 76     Recent Results (from the past 240 hour(s))  Culture, blood (routine x 2)     Status: None (Preliminary result)   Collection Time: 03/16/21 10:23 PM   Specimen: BLOOD LEFT HAND  Result Value Ref Range Status   Specimen Description BLOOD LEFT HAND  Final   Special Requests   Final    BOTTLES DRAWN AEROBIC AND ANAEROBIC Blood Culture adequate volume   Culture   Final    NO GROWTH < 12 HOURS Performed at [facility], [address] [place], [place] [address]    Report Status PENDING  Incomplete  Culture, blood (routine x 2)     Status: None (Preliminary result)   Collection Time: 03/16/21 11:08 PM   Specimen: BLOOD LEFT HAND  Result Value Ref Range Status   Specimen Description BLOOD LEFT HAND  Final   Special Requests   Final    BOTTLES DRAWN AEROBIC AND ANAEROBIC Blood Culture adequate volume   Culture   Final    NO GROWTH < 12 HOURS Performed at [facility], [address], [place], [place] [address]    Report Status PENDING  Incomplete    Radiology Studies: DG Chest 1 View  Result Date: 03/16/2021 CLINICAL DATA:  Right-sided PICC placement, abdominal pain EXAM: CHEST  1 VIEW COMPARISON:  03/04/2021 FINDINGS: Single frontal view of the chest  demonstrates right-sided PICC tip overlying superior vena cava. The cardiac silhouette is unremarkable. Improved aeration at the lung bases, with minimal residual bibasilar consolidation. No effusion or pneumothorax. IMPRESSION: 1. Unremarkable right-sided PICC. 2. Improving bibasilar consolidation which may reflect resolving pneumonia or atelectasis. Electronically Signed   By: Randa Ngo M.D.   On: 03/16/2021 15:30   Scheduled Meds: . Chlorhexidine Gluconate Cloth  6 each Topical Daily  . citalopram  10 mg Oral Daily  . gabapentin  100 mg Oral BID  . heparin injection (subcutaneous)  5,000 Units Subcutaneous Q8H  . hydrALAZINE  25 mg Oral BID  . insulin aspart  0-15 Units Subcutaneous TID WC  . insulin aspart  0-5 Units Subcutaneous QHS  . insulin glargine  35 Units Subcutaneous QHS  . isosorbide mononitrate  30 mg Oral Daily  . lisinopril  10 mg Oral Daily  . metoCLOPramide (REGLAN) injection  5 mg Intravenous Q8H  . traZODone  50 mg Oral QHS  Continuous Infusions: .  ceFAZolin (ANCEF) IV 2 g (03/17/21 1445)  . erythromycin 250 mg (03/17/21 1604)  . lactated ringers 75 mL/hr at 03/16/21 2201     LOS: 1 day    Time spent: 30 minutes   Barton Dubois, MD [facility]   To contact the attending provider between 7A-7P or the covering provider during after hours 7P-7A, please log into the web site [email or web address] and access using universal [place] password for that web site. If you do not have the password, please call the hospital operator.  03/17/2021, 4:12 PM

## 2021-03-18 ENCOUNTER — Inpatient Hospital Stay (HOSPITAL_COMMUNITY): Payer: Self-pay

## 2021-03-18 LAB — GLUCOSE, CAPILLARY
Glucose-Capillary: 25 mg/dL — CL (ref 70–99)
Glucose-Capillary: 34 mg/dL — CL (ref 70–99)
Glucose-Capillary: 137 mg/dL — ABNORMAL HIGH (ref 70–99)
Glucose-Capillary: 98 mg/dL (ref 70–99)
Glucose-Capillary: 57 mg/dL — ABNORMAL LOW (ref 70–99)
Glucose-Capillary: 135 mg/dL — ABNORMAL HIGH (ref 70–99)
Glucose-Capillary: 108 mg/dL — ABNORMAL HIGH (ref 70–99)
Glucose-Capillary: 120 mg/dL — ABNORMAL HIGH (ref 70–99)

## 2021-03-18 LAB — BASIC METABOLIC PANEL
Anion gap: 7 (ref 5–15)
BUN: 9 mg/dL (ref 6–20)
CO2: 26 mmol/L (ref 22–32)
Calcium: 8.2 mg/dL — ABNORMAL LOW (ref 8.9–10.3)
Chloride: 102 mmol/L (ref 98–111)
Creatinine, Ser: 1.04 mg/dL — ABNORMAL HIGH (ref 0.44–1.00)
GFR, Estimated: >60 mL/min (ref >60)
Glucose, Bld: 159 mg/dL — ABNORMAL HIGH (ref 70–99)
Potassium: 3.5 mmol/L (ref 3.5–5.1)
Sodium: 135 mmol/L (ref 135–145)

## 2021-03-18 MED ORDER — DEXTROSE 50 % IV SOLN
25.0000 g | INTRAVENOUS | Status: AC
  Administered 2021-03-18: 25 g via INTRAVENOUS

## 2021-03-18 MED ORDER — DEXTROSE 50 % IV SOLN
1.0000 | Freq: Once | INTRAVENOUS | Status: AC
  Administered 2021-03-19: 50 mL via INTRAVENOUS
  Filled 2021-03-18: qty 50

## 2021-03-18 MED ORDER — INSULIN GLARGINE 100 UNIT/ML ~~LOC~~ SOLN
25.0000 [IU] | Freq: Every day | SUBCUTANEOUS | Status: DC
  Administered 2021-03-18: 25 [IU] via SUBCUTANEOUS
  Filled 2021-03-18 (×2): qty 0.25

## 2021-03-18 MED ORDER — METOPROLOL SUCCINATE ER 25 MG PO TB24
12.5000 mg | ORAL_TABLET | Freq: Every day | ORAL | Status: DC
  Administered 2021-03-19: 12.5 mg via ORAL
  Filled 2021-03-18 (×2): qty 1

## 2021-03-18 MED ORDER — ISOSORBIDE MONONITRATE ER 30 MG PO TB24
15.0000 mg | ORAL_TABLET | Freq: Every day | ORAL | Status: DC
  Administered 2021-03-19: 15 mg via ORAL
  Filled 2021-03-18: qty 1

## 2021-03-18 MED ORDER — DEXTROSE 50 % IV SOLN
INTRAVENOUS | Status: AC
  Administered 2021-03-18: 50 mL
  Filled 2021-03-18: qty 50

## 2021-03-18 NOTE — Progress Notes (Signed)
PROGRESS NOTE    Diamond [name]  EXB:[id number] DOB: 05-05-1996 DOA: 03/16/2021 PCP: [name], PA-C  Chief Complaint  Patient presents with  . Abdominal Pain    Brief admission narrative:  As per H&P written by Dr. Waldron Labs on 03/16/21 Diamond [name]  is a 25 y.o. female, with past medical history of uncontrolled type 1 diabetes mellitus, with multiple admissions in the past for HHS, and hyperglycemia, anasarca, with recent hospital admission significant for staph aureus bacteremia and sepsis due to multifocal pneumonia and possible lumbar discitis, where she was discharged with PICC line to receive total 6 weeks of IV cefazolin,she presents with complains of nausea,vomiting and abdominal pain, patient reports the symptoms present since her recent discharge, she does report pain in her lower back area and tailbone, without significant change since discharge, reports vomiting, unable to tolerate any oral intake, denies fever, chills, chest pain, diarrhea or constipation. - in ED glucose was elevated at 458, but no evidence of DKA, hemoglobin at 9.6, white blood cell at 8.5, had negative urine analysis, she had intractable nausea and vomiting despite receiving Reglan and Zofran, so [facility] hospitalist consulted to admit.  Assessment & Plan: 1-intractable nausea and vomiting in the setting of gastroparesis -Currently no vomiting -Still complaining of nausea -Continue as needed antiemetics -Continue the use of Reglan erythromycin. -Continue advancing diet as tolerated.   -Continue to maintain adequate hydration. -Follow electrolytes trend and replete as needed.  2-recent admission secondary to systolic heart use multifocal pneumonia and bacteremia -Continue treatment with IV Ancef  -Patient is afebrile -Resume IV antibiotics as an outpatient as per OPAT at discharge  3-GERD -will continue PPI  4-Essential hypertension, benign -continue current antihypertensive regimen  5-DM1  with Diabetic nephropathy with proteinuria and Nephrotic Syndrome- -continue SSI and adjusted dose of long acting insulin -follow CBG's fluctuation -continue lisinopril -follow up with nephrology as an outpatient  -With positive hypoglycemic event overnight; long-acting insulin dose has been adjusted and will advance diet.  6-sinus tachycardia/SVT -Low-dose Toprol will be started -These may also and may help in controlling patient's headaches.  DVT prophylaxis: heparin. Code Status: Full Code. Family Communication: no family at bedside. Disposition:   Status is: Inpatient  Dispo: The patient is from: home              Anticipated d/c is to: home              Patient currently still with ongoing intermittent nausea; will continue IVF's, antiemetics and slowly advancing diet.    Difficult to place patient no    Consultants:   None   Procedures:  See below for x-ray reports.   Antimicrobials:  IV Ancef   Subjective: No fever, still complaining of intermittent nausea, lightheadedness and headaches.  Denies chest pain.  Vague abdominal discomfort reported.  Objective: Vitals:   03/17/21 2105 03/18/21 0628 03/18/21 1116 03/18/21 1425  BP: 133/88 (!) 148/102 132/87 (!) 134/98  Pulse: 100 91 99 (!) 106  Resp:  16 16   Temp: 98.6 F (37 C) 97.6 F (36.4 C)  98.5 F (36.9 C)  TempSrc: Oral Oral  Oral  SpO2: 99% 100% 99% 98%    Intake/Output Summary (Last 24 hours) at 03/18/2021 1836 Last data filed at 03/18/2021 1353 Gross per 24 hour  Intake 960 ml  Output 800 ml  Net 160 ml   There were no vitals filed for this visit.  Examination: General exam: Alert, awake, oriented x 3; reports having some  headaches, feeling lightheaded and is still with intermittent nausea.  No further vomiting and would like to have her diet advanced.  Patient is afebrile, denies chest pain and is in not major distress currently. Respiratory system: Clear to auscultation. Respiratory effort  normal.  No using accessory muscle.  Concentration abdominal. Cardiovascular system: Sinus tachycardia, no rubs, no gallops, no JVD Gastrointestinal system: Abdomen is nondistended, soft and vaguely tender on palpation. No organomegaly or masses felt. Normal bowel sounds heard. Central nervous system: Alert and oriented. No focal neurological deficits. Extremities: No cyanosis or clubbing. Skin: No petechiae. Psychiatry: Judgement and insight appear normal. Mood & affect appropriate.    Data Reviewed: I have personally reviewed following labs and imaging studies  CBC: Recent Labs  Lab 03/16/21 1631 03/17/21 0519  WBC 8.5 6.9  HGB 9.6* 8.3*  HCT 30.5* 27.1*  MCV 93.8 95.4  PLT 452* 505    Basic Metabolic Panel: Recent Labs  Lab 03/16/21 1631 03/17/21 0519 03/18/21 0425  NA 135 136 135  K 4.1 3.8 3.5  CL 97* 103 102  CO2 26 24 26   GLUCOSE 458* 190* 159*  BUN 13 10 9   CREATININE 0.83 0.84 1.04*  CALCIUM 8.6* 7.9* 8.2*    GFR: Estimated Creatinine Clearance: 66.2 mL/min (A) (by C-G formula based on SCr of 1.04 mg/dL (H)).  Liver Function Tests: Recent Labs  Lab 03/16/21 1631  AST 16  ALT 6  ALKPHOS 167*  BILITOT 0.6  PROT 7.3  ALBUMIN 2.2*    CBG: Recent Labs  Lab 03/18/21 0239 03/18/21 0728 03/18/21 1056 03/18/21 1416 03/18/21 1613  GLUCAP 137* 98 57* 135* 108*     Recent Results (from the past 240 hour(s))  SARS CORONAVIRUS 2 (TAT 6-24 HRS) Nasopharyngeal Nasopharyngeal Swab     Status: None   Collection Time: 03/16/21 10:02 PM   Specimen: Nasopharyngeal Swab  Result Value Ref Range Status   SARS Coronavirus 2 NEGATIVE NEGATIVE Final    Comment: (NOTE) SARS-CoV-2 target nucleic acids are NOT DETECTED.  The SARS-CoV-2 RNA is generally detectable in upper and lower respiratory specimens during the acute phase of infection. Negative results do not preclude SARS-CoV-2 infection, do not rule out co-infections with other pathogens, and should not  be used as the sole basis for treatment or other patient management decisions. Negative results must be combined with clinical observations, patient history, and epidemiological information. The expected result is Negative.  Fact Sheet for Patients: SugarRoll.be  Fact Sheet for Healthcare Providers: https://www.woods-mathews.com/  This test is not yet approved or cleared by the [place] FDA and  has been authorized for detection and/or diagnosis of SARS-CoV-2 by FDA under an Emergency Use Authorization (EUA). This EUA will remain  in effect (meaning this test can be used) for the duration of the COVID-19 declaration under Se ction 564(b)(1) of the Act, 21 U.S.C. section 360bbb-3(b)(1), unless the authorization is terminated or revoked sooner.  Performed at [facility], [place] [address] [place],  [address]   Culture, blood (routine x 2)     Status: None (Preliminary result)   Collection Time: 03/16/21 10:23 PM   Specimen: BLOOD LEFT HAND  Result Value Ref Range Status   Specimen Description BLOOD LEFT HAND  Final   Special Requests   Final    BOTTLES DRAWN AEROBIC AND ANAEROBIC Blood Culture adequate volume   Culture   Final    NO GROWTH 2 DAYS Performed at [facility], [address] [place],  [place] [address]    Report Status PENDING  Incomplete  Culture, blood (routine x 2)     Status: None (Preliminary result)   Collection Time: 03/16/21 11:08 PM   Specimen: BLOOD LEFT HAND  Result Value Ref Range Status   Specimen Description BLOOD LEFT HAND  Final   Special Requests   Final    BOTTLES DRAWN AEROBIC AND ANAEROBIC Blood Culture adequate volume   Culture   Final    NO GROWTH 2 DAYS Performed at [facility], [address] [place], [place] [address]    Report Status PENDING  Incomplete    Radiology Studies: US Abdomen Complete  Result Date: 03/18/2021 CLINICAL DATA:  Nausea and vomiting for 1 week. Patient  had ultrasound-guided core biopsy of the LEFT kidney performed on 03/07/2021 for history of nephrotic syndrome. EXAM: ABDOMEN ULTRASOUND COMPLETE COMPARISON:  CT of the abdomen and pelvis on 01/17/2021, abdominal ultrasound 10/18/2020 BB FINDINGS: Gallbladder: Gallbladder wall is 1.9 millimeters in thickness. No stones, pericholecystic fluid, or Murphy's sign. Note is made of a 5 millimeter polyp along the posterior wall of the gallbladder, not seen on prior study. Common bile duct: Diameter: 4.4 millimeters Liver: No focal lesion identified. Within normal limits in parenchymal echogenicity. Portal vein is patent on color Doppler imaging with normal direction of blood flow towards the liver. IVC: No abnormality visualized. Pancreas: Visualized portion unremarkable. Spleen: Normal appearance of the spleen. Accessory spleen along the inferior tip measures 1.8 centimeters. Right Kidney: Length: 10.8 centimeters. Echogenicity within normal limits. No mass or hydronephrosis visualized. Left Kidney: Length: 11.3 centimeters. Mildly heterogeneous subcapsular collection along the anterolateral aspect of the LEFT kidney measures 5 x 0.6 centimeters and likely represents hematoma/seroma from recent ultrasound-guided core biopsy. The appearance would be atypical for abscess. There is no evidence for inflammatory changes in the surrounding fat. No suspicious mass or hydronephrosis. Abdominal aorta: No aneurysm visualized. Other findings: Small bilateral pleural effusions. Question trace ascites. IMPRESSION: 1. No evidence for acute cholecystitis. Small gallbladder polyp measures 5 millimeters. 2. Subcapsular collection along the anterolateral aspect of the LEFT kidney following ultrasound-guided core biopsy on 03/07/2021. Favor subcapsular hematoma/seroma. There is no obvious abscess. However, consider further evaluation with CT of the abdomen and pelvis for further characterization. Intravenous contrast is recommended unless  it is contraindicated. Electronically Signed   By: Nolon Nations M.D.   On: 03/18/2021 11:31   Scheduled Meds: . Chlorhexidine Gluconate Cloth  6 each Topical Daily  . citalopram  10 mg Oral Daily  . dextrose  1 ampule Intravenous Once  . gabapentin  100 mg Oral BID  . heparin injection (subcutaneous)  5,000 Units Subcutaneous Q8H  . hydrALAZINE  25 mg Oral BID  . insulin aspart  0-15 Units Subcutaneous TID WC  . insulin aspart  0-5 Units Subcutaneous QHS  . insulin glargine  25 Units Subcutaneous QHS  . [START ON 03/19/2021] isosorbide mononitrate  15 mg Oral Daily  . lisinopril  10 mg Oral Daily  . metoCLOPramide (REGLAN) injection  5 mg Intravenous Q8H  . metoprolol succinate  12.5 mg Oral Daily  . traZODone  50 mg Oral QHS   Continuous Infusions: .  ceFAZolin (ANCEF) IV 2 g (03/18/21 1412)  . lactated ringers 75 mL/hr at 03/18/21 1117     LOS: 2 days    Time spent: 30 minutes   Barton Dubois, MD [facility]   To contact the attending provider between 7A-7P or the covering provider during after hours 7P-7A,  please log into the web site [email or web address] and access using universal [place] password for that web site. If you do not have the password, please call the hospital operator.  03/18/2021, 6:36 PM

## 2021-03-18 NOTE — Plan of Care (Signed)

## 2021-03-19 DIAGNOSIS — M545 Low back pain, unspecified: Secondary | ICD-10-CM

## 2021-03-19 DIAGNOSIS — K219 Gastro-esophageal reflux disease without esophagitis: Secondary | ICD-10-CM

## 2021-03-19 DIAGNOSIS — F39 Unspecified mood [affective] disorder: Secondary | ICD-10-CM

## 2021-03-19 DIAGNOSIS — R109 Unspecified abdominal pain: Secondary | ICD-10-CM

## 2021-03-19 LAB — BASIC METABOLIC PANEL
Anion gap: 8 (ref 5–15)
BUN: 7 mg/dL (ref 6–20)
CO2: 27 mmol/L (ref 22–32)
Calcium: 8.1 mg/dL — ABNORMAL LOW (ref 8.9–10.3)
Chloride: 104 mmol/L (ref 98–111)
Creatinine, Ser: 0.96 mg/dL (ref 0.44–1.00)
GFR, Estimated: >60 mL/min (ref >60)
Glucose, Bld: 83 mg/dL (ref 70–99)
Potassium: 3.5 mmol/L (ref 3.5–5.1)
Sodium: 139 mmol/L (ref 135–145)

## 2021-03-19 LAB — GLUCOSE, CAPILLARY
Glucose-Capillary: 105 mg/dL — ABNORMAL HIGH (ref 70–99)
Glucose-Capillary: 112 mg/dL — ABNORMAL HIGH (ref 70–99)
Glucose-Capillary: 142 mg/dL — ABNORMAL HIGH (ref 70–99)
Glucose-Capillary: 48 mg/dL — ABNORMAL LOW (ref 70–99)

## 2021-03-19 LAB — CBC
HCT: 29 % — ABNORMAL LOW (ref 36.0–46.0)
Hemoglobin: 9 g/dL — ABNORMAL LOW (ref 12.0–15.0)
MCH: 29.5 pg (ref 26.0–34.0)
MCHC: 31 g/dL (ref 30.0–36.0)
MCV: 95.1 fL (ref 80.0–100.0)
Platelets: 398 10*3/uL (ref 150–400)
RBC: 3.05 MIL/uL — ABNORMAL LOW (ref 3.87–5.11)
RDW: 15.2 % (ref 11.5–15.5)
WBC: 5.7 10*3/uL (ref 4.0–10.5)
nRBC: 0 % (ref 0.0–0.2)

## 2021-03-19 MED ORDER — OMEPRAZOLE 40 MG PO CPDR: 40.0000 mg | DELAYED_RELEASE_CAPSULE | Freq: Two times a day (BID) | ORAL | 3 refills | Status: DC

## 2021-03-19 MED ORDER — ISOSORBIDE MONONITRATE ER 30 MG PO TB24: 15.0000 mg | ORAL_TABLET | Freq: Every day | ORAL | Status: AC

## 2021-03-19 MED ORDER — METOCLOPRAMIDE HCL 5 MG PO TABS: 5.0000 mg | ORAL_TABLET | Freq: Three times a day (TID) | ORAL | 0 refills | Status: DC | PRN

## 2021-03-19 MED ORDER — BASAGLAR KWIKPEN 100 UNIT/ML ~~LOC~~ SOPN: 30.0000 [IU] | PEN_INJECTOR | Freq: Every day | SUBCUTANEOUS | Status: DC

## 2021-03-19 MED ORDER — METOPROLOL SUCCINATE ER 25 MG PO TB24: 12.5000 mg | ORAL_TABLET | Freq: Every day | ORAL | 1 refills | Status: DC

## 2021-03-19 NOTE — TOC Transition Note (Signed)
Transition of Care [facility]) - CM/SW Discharge Note   Patient Details  Name: Diamond [name] MRN: [id number] Date of Birth: [birth year]  Transition of Care [facility]) CM/SW Contact:  Natasha Bence, LCSW Phone Number: 03/19/2021, 1:59 PM   Clinical Narrative:    CSW notified of patient's readiness for discharge. CSW notified Corene Cornea with [facility]. Corene Cornea agreeable to resume services. TOC signing off.   Final next level of care: [place] Barriers to Discharge: Barriers Resolved   Patient Goals and CMS Choice Patient states their goals for this hospitalization and ongoing recovery are:: Return home CMS Medicare.gov Compare Post Acute Care list provided to:: Patient Choice offered to / list presented to : Patient  Discharge Placement                    Patient and family notified of of transfer: 03/19/21  Discharge Plan and Services                DME Arranged: N/A DME Agency: NA       HH Arranged: RN [place] Agency: [place] ([facility])        Social Determinants of Health (SDOH) Interventions     Readmission Risk Interventions Readmission Risk Prevention Plan 03/07/2021 01/27/2021  Transportation Screening Complete Complete  PCP or Specialist Appt within 3-5 Days - Complete  HRI or [place] - Complete  Social Work Consult for [place] Planning/Counseling - Complete  Palliative Care Screening - Not Applicable  Medication Review Press photographer) Complete Complete  HRI or Home Care Consult Complete -  SW Recovery Care/Counseling Consult Complete -  Palliative Care Screening Not Applicable -  [place] Not Applicable -  Some recent data might be hidden

## 2021-03-19 NOTE — Discharge Summary (Signed)
Physician Discharge Summary  Diamond [name] WSF:[id number] DOB: [birth year] DOA: 03/16/2021  PCP: [name], PA-C  Admit date: 03/16/2021 Discharge date: 03/19/2021  Time spent: 35 minutes  Recommendations for Outpatient Follow-up:  1. Continue close monitoring of patient's CBGs/A1c with further adjustment to hypoglycemia regimen as required. 2. Continue to follow response to adjusted medications for gastroparesis symptomatology management 3. Complete outpatient antibiotic therapy and follow-up with infectious disease as needed.   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  Discharged home with instruction to follow-up with PCP in the next 10 days.  Code status: Full Code.  Diet recommendation: Heart healthy modified carbohydrate.  History of present illness:  As per H&P written by Dr. Waldron Labs on 03/16/21 [name] a25 y.o.female,with past medical history of uncontrolled type 1 diabetes mellitus, with multiple admissions in the past for HHS, and hyperglycemia, anasarca, with recenthospital admission significant for staph aureus bacteremia and sepsis due to multifocal pneumonia and possible lumbar discitis,where she was discharged with PICC line to receive total6 weeks of IV cefazolin,she presents with complains of nausea,vomiting and abdominal pain,patient reports the symptoms present since her recent discharge, she does report pain in her lower back area and tailbone, without significant change since discharge, reports vomiting, unable to tolerate any oral intake, denies fever, chills, chest pain, diarrhea or constipation. -  in [place] was elevated at 458, but no evidence of DKA, hemoglobin at 9.6, white blood cell at 8.5, had negative urine analysis, she had intractable nausea and vomiting despite receiving Reglan and Zofran, so [facility] hospitalist consulted to admit.  Hospital Course:  1-Intractable nausea and vomiting in the setting of gastroparesis -Currently no vomiting -Still complaining of intermittent nausea -Continue as needed antiemetics -Continue the use of Reglan at discharge. -Tolerating diet at time of discharge, no further vomiting.  -Continue to maintain adequate hydration. -Repeat basic metabolic panel to follow electrolytes and further determine the need of repletion/supplementation.  2-recent admission secondary to systolic heart use multifocal pneumonia and bacteremia -Continue treatment with IV Ancef  -Patient is afebrile -Resume IV antibiotics as an outpatient as per OPAT at discharge  3-GERD -will continue PPI -Dose has been adjusted to twice a day for better symptoms management.  4-Essential hypertension, benign -continue current antihypertensive regimen -Heart healthy diet has been encouraged.  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  6-sinus tachycardia/SVT -Low-dose Toprol will be started -These may also and help in controlling patient's headaches.   Procedures:  See below for x-ray reports  Consultations:  None   Discharge Exam: Vitals:   03/19/21 0514 03/19/21 1014  BP: 108/75 (!) 134/98  Pulse: (!) 106 (!) 109  Resp: 16   Temp: 97.8 F (36.6 C)   SpO2: 99%     General: No vomiting, reports no lightheadedness and improvement in headaches.  Has tolerated diet without problems and feeling  ready to go home.  Still experiencing intermittent nausea. Cardiovascular: S1 and S2, no rubs, no gallops,  no JVD. Respiratory: good air movement, no wheezing, no crackles. Abd: soft, no guarding, positive BS Extremities: no cyanosis, no clubbing   Discharge Instructions   Discharge Instructions    Diet - low sodium heart healthy   Complete by: As directed    Discharge instructions   Complete by: As directed    Complete antibiotic as previously instructed Maintain adequate hydration  Follow small multiple meals throughout the day to facilitate digestion and nutrition Watch amount of carbohydrates in your diet and avoid skipping meals. Be compliant with medications. Follow-up with PCP in 10 days.-   Increase activity slowly   Complete by: As directed      Allergies as of 03/19/2021      Reactions   Amoxicillin Hives   Did it involve swelling of the face/tongue/throat, SOB, or low BP? no  Did it involve sudden or severe rash/hives, skin peeling, or any reaction on the inside of your mouth or nose? no Did you need to seek medical attention at a hospital or doctor's office? no When did it last happen?unk If all above answers are "NO", may proceed with cephalosporin use.   Dexamethasone Hives, Itching   Doxycycline Itching, Swelling   Penicillins Hives, Itching   Did it involve swelling of the face/tongue/throat, SOB, or low BP? no  Did it involve sudden or severe rash/hives, skin peeling, or any reaction on the inside of your mouth or nose? no Did you need to seek medical attention at a hospital or doctor's office? no When did it last happen?unk If all above answers are "NO", may proceed with cephalosporin use. Did it involve swelling of the face/tongue/throat, SOB, or low BP? no  Did it involve sudden or severe rash/hives, skin peeling, or any reac   Ceftriaxone Sodium In Dextrose Itching   Augmentin [amoxicillin-pot Clavulanate] Other (See Comments)   Reaction  unknown   Clavulanic Acid    Adhesive [tape] Itching, Rash   Latex Itching, Rash   Other Itching, Rash   Pineapple Rash   Rash on tongue and throat      Medication List    TAKE these medications   albuterol 108 (90 Base) MCG/ACT inhaler Commonly known as: VENTOLIN HFA Inhale 2 puffs into the lungs every 6 (six) hours as needed for wheezing or shortness of breath. For shortness of breath   Basaglar KwikPen 100 UNIT/ML Inject 30 Units into the skin at bedtime. What changed: how much to take   ceFAZolin  IVPB Commonly known as: ANCEF Inject 2 g into the vein every 8 (eight) hours. Indication:  Bacteremia, possible discitis First Dose: No Last Day of Therapy:  04/17/21 Labs - Once weekly:  CBC/D and BMP, Labs - Every other week:  ESR and CRP Method of administration: IV Push Method of administration may be changed at the discretion of home infusion pharmacist based upon assessment of the patient and/or caregiver's ability to self-administer the medication ordered.   citalopram 10 MG tablet Commonly known as: CELEXA Take 10 mg by mouth daily.   diphenhydrAMINE 12.5 MG/5ML elixir Commonly known as: BENADRYL Take 10 mLs (25 mg total) by mouth every 6 (six) hours as needed for itching, allergies or sleep.   gabapentin 100 MG capsule Commonly known as: NEURONTIN Take 1 capsule (100 mg total) by mouth 2 (two) times daily.   hydrALAZINE 25 MG tablet Commonly known as: APRESOLINE Take 1 tablet (25 mg total) by mouth 2 (two) times daily.   insulin lispro 100 UNIT/ML KwikPen  Commonly known as: HUMALOG Inject 10-16 Units into the skin 3 (three) times daily before meals.   isosorbide mononitrate 30 MG 24 hr tablet Commonly known as: IMDUR Take 0.5 tablets (15 mg total) by mouth daily. What changed: how much to take   lisinopril 10 MG tablet Commonly known as: ZESTRIL Take 1 tablet (10 mg total) by mouth daily. For Renal Protection/Proteinuria   metoCLOPramide 5 MG  tablet Commonly known as: Reglan Take 1 tablet (5 mg total) by mouth every 8 (eight) hours as needed for refractory nausea / vomiting (refractory symptoms).   metoprolol succinate 25 MG 24 hr tablet Commonly known as: TOPROL-XL Take 0.5 tablets (12.5 mg total) by mouth daily. Start taking on: March 20, 2021   omeprazole 40 MG capsule Commonly known as: PRILOSEC Take 1 capsule (40 mg total) by mouth in the morning and at bedtime. What changed: when to take this   ondansetron 8 MG disintegrating tablet Commonly known as: Zofran ODT Take 1 tablet (8 mg total) by mouth every 8 (eight) hours as needed for nausea or vomiting.   traZODone 50 MG tablet Commonly known as: DESYREL Take 1 tablet (50 mg total) by mouth at bedtime.      Allergies  Allergen Reactions  . Amoxicillin Hives    Did it involve swelling of the face/tongue/throat, SOB, or low BP? no  Did it involve sudden or severe rash/hives, skin peeling, or any reaction on the inside of your mouth or nose? no Did you need to seek medical attention at a hospital or doctor's office? no When did it last happen?unk If all above answers are "NO", may proceed with cephalosporin use.  Marland Kitchen Dexamethasone Hives and Itching  . Doxycycline Itching and Swelling  . Penicillins Hives and Itching    Did it involve swelling of the face/tongue/throat, SOB, or low BP? no  Did it involve sudden or severe rash/hives, skin peeling, or any reaction on the inside of your mouth or nose? no Did you need to seek medical attention at a hospital or doctor's office? no When did it last happen?unk If all above answers are "NO", may proceed with cephalosporin use. Did it involve swelling of the face/tongue/throat, SOB, or low BP? no  Did it involve sudden or severe rash/hives, skin peeling, or any reac  . Ceftriaxone Sodium In Dextrose Itching  . Augmentin [Amoxicillin-Pot Clavulanate] Other (See Comments)    Reaction unknown  . Clavulanic Acid    . Adhesive [Tape] Itching and Rash  . Latex Itching and Rash  . Other Itching and Rash  . Pineapple Rash    Rash on tongue and throat    The results of significant diagnostics from this hospitalization (including imaging, microbiology, ancillary and laboratory) are listed below for reference.    Significant Diagnostic Studies: DG Chest 1 View  Result Date: 03/16/2021 CLINICAL DATA:  Right-sided PICC placement, abdominal pain EXAM: CHEST  1 VIEW COMPARISON:  03/04/2021 FINDINGS: Single frontal view of the chest demonstrates right-sided PICC tip overlying superior vena cava. The cardiac silhouette is unremarkable. Improved aeration at the lung bases, with minimal residual bibasilar consolidation. No effusion or pneumothorax. IMPRESSION: 1. Unremarkable right-sided PICC. 2. Improving bibasilar consolidation which may reflect resolving pneumonia or atelectasis. Electronically Signed   By: Randa Ngo M.D.   On: 03/16/2021 15:30   DG Chest 2 View  Result Date: 03/04/2021 CLINICAL DATA:  Fever and shortness of breath for several days. EXAM: CHEST - 2 VIEW COMPARISON:  03/02/2021 FINDINGS:  Cardiomediastinal silhouette and pulmonary vasculature are within normal limits. Interval development of of bibasilar airspace opacities suspicious for pneumonia. IMPRESSION: Findings suspicious for multifocal pneumonia. Electronically Signed   By: Miachel Roux M.D.   On: 03/04/2021 10:28   MR Lumbar Spine W Wo Contrast  Result Date: 03/06/2021 CLINICAL DATA:  Staph bacteremia. Low back pain. Bilateral leg weakness. EXAM: MRI LUMBAR SPINE WITHOUT AND WITH CONTRAST TECHNIQUE: Multiplanar and multiecho pulse sequences of the lumbar spine were obtained without and with intravenous contrast. CONTRAST:  9m GADAVIST GADOBUTROL 1 MMOL/ML IV SOLN COMPARISON:  None. FINDINGS: Segmentation:  5 lumbar type vertebral bodies. Alignment:  Normal Vertebrae:  Normal.  No evidence of bone or joint infection. Conus medullaris and  cauda equina: Conus extends to the L1 level. Conus and cauda equina appear normal. Paraspinal and other soft tissues: Deep para spinous soft tissues are normal. No evidence of epidural space infection. Nonspecific edema pattern/fluid accumulation within the fatty tissues of the back, superficial to the paraspinous muscles, extending from T12-S1. This is more pronounced than usually seen with simple dependent positioning and the patient could possibly have a fascial plane infection in the back. Disc levels: Mild bulging of the discs at L3-4, L4-5 and L5-S1 but no apparent compressive stenosis. IMPRESSION: 1. No evidence of epidural space infection. No evidence of spinal infection. 2. Nonspecific edema pattern within the fatty tissues of the back throughout the lumbar region, superficial to the paraspinous muscles. This is more pronounced than usually seen with simple dependent positioning and the patient could possibly have a fascial plane infection in the back. Electronically Signed   By: MNelson ChimesM.D.   On: 03/06/2021 14:33   NM Pulmonary Perfusion  Result Date: 03/04/2021 CLINICAL DATA:  Positive D-dimer. Shortness of breath and chest pain several months. Low to intermediate probability for pulmonary embolism. EXAM: NUCLEAR MEDICINE PERFUSION LUNG SCAN TECHNIQUE: Perfusion images were obtained in multiple projections after intravenous injection of radiopharmaceutical. Ventilation scans intentionally deferred if perfusion scan and chest x-ray adequate for interpretation during COVID 19 epidemic. RADIOPHARMACEUTICALS:  4.4 mCi Tc-943mAA IV COMPARISON:  Two view chest 03/04/2021 FINDINGS: Examination demonstrates normal perfusion images without focal peripheral wedge-shaped perfusion defect. IMPRESSION: Normal perfusion lung scan without evidence of pulmonary emboli. Electronically Signed   By: DaMarin Olp.D.   On: 03/04/2021 11:06   USKoreabdomen Complete  Result Date: 03/18/2021 CLINICAL DATA:  Nausea  and vomiting for 1 week. Patient had ultrasound-guided core biopsy of the LEFT kidney performed on 03/07/2021 for history of nephrotic syndrome. EXAM: ABDOMEN ULTRASOUND COMPLETE COMPARISON:  CT of the abdomen and pelvis on 01/17/2021, abdominal ultrasound 10/18/2020 BB FINDINGS: Gallbladder: Gallbladder wall is 1.9 millimeters in thickness. No stones, pericholecystic fluid, or Murphy's sign. Note is made of a 5 millimeter polyp along the posterior wall of the gallbladder, not seen on prior study. Common bile duct: Diameter: 4.4 millimeters Liver: No focal lesion identified. Within normal limits in parenchymal echogenicity. Portal vein is patent on color Doppler imaging with normal direction of blood flow towards the liver. IVC: No abnormality visualized. Pancreas: Visualized portion unremarkable. Spleen: Normal appearance of the spleen. Accessory spleen along the inferior tip measures 1.8 centimeters. Right Kidney: Length: 10.8 centimeters. Echogenicity within normal limits. No mass or hydronephrosis visualized. Left Kidney: Length: 11.3 centimeters. Mildly heterogeneous subcapsular collection along the anterolateral aspect of the LEFT kidney measures 5 x 0.6 centimeters and likely represents hematoma/seroma from recent ultrasound-guided core biopsy. The appearance would be atypical for  abscess. There is no evidence for inflammatory changes in the surrounding fat. No suspicious mass or hydronephrosis. Abdominal aorta: No aneurysm visualized. Other findings: Small bilateral pleural effusions. Question trace ascites. IMPRESSION: 1. No evidence for acute cholecystitis. Small gallbladder polyp measures 5 millimeters. 2. Subcapsular collection along the anterolateral aspect of the LEFT kidney following ultrasound-guided core biopsy on 03/07/2021. Favor subcapsular hematoma/seroma. There is no obvious abscess. However, consider further evaluation with CT of the abdomen and pelvis for further characterization. Intravenous  contrast is recommended unless it is contraindicated. Electronically Signed   By: Nolon Nations M.D.   On: 03/18/2021 11:31   US Venous Img Lower Bilateral (DVT)  Result Date: 03/04/2021 CLINICAL DATA:  Bilateral lower extremity edema.  Evaluate for DVT. EXAM: BILATERAL LOWER EXTREMITY VENOUS DOPPLER ULTRASOUND TECHNIQUE: Gray-scale sonography with graded compression, as well as color Doppler and duplex ultrasound were performed to evaluate the lower extremity deep venous systems from the level of the common femoral vein and including the common femoral, femoral, profunda femoral, popliteal and calf veins including the posterior tibial, peroneal and gastrocnemius veins when visible. The superficial great saphenous vein was also interrogated. Spectral Doppler was utilized to evaluate flow at rest and with distal augmentation maneuvers in the common femoral, femoral and popliteal veins. COMPARISON:  Nuclear medicine V/Q scan-03/04/2021 Bilateral lower extremity venous Doppler ultrasound-01/24/2021 (negative). FINDINGS: RIGHT LOWER EXTREMITY Common Femoral Vein: No evidence of thrombus. Normal compressibility, respiratory phasicity and response to augmentation. Saphenofemoral Junction: No evidence of thrombus. Normal compressibility and flow on color Doppler imaging. Profunda Femoral Vein: No evidence of thrombus. Normal compressibility and flow on color Doppler imaging. Femoral Vein: No evidence of thrombus. Normal compressibility, respiratory phasicity and response to augmentation. Popliteal Vein: No evidence of thrombus. Normal compressibility, respiratory phasicity and response to augmentation. Calf Veins: No evidence of thrombus. Normal compressibility and flow on color Doppler imaging. Superficial Great Saphenous Vein: No evidence of thrombus. Normal compressibility. Venous Reflux:  None. Other Findings: There is a moderate amount of subcutaneous edema at the level the right calf. Redemonstrated prominent  right inguinal lymph nodes with index right inguinal lymph node measuring 0.8 cm in greatest short axis diameter (image 2), similar to the 01/24/2021 examination, presumably reactive in etiology. LEFT LOWER EXTREMITY Common Femoral Vein: No evidence of thrombus. Normal compressibility, respiratory phasicity and response to augmentation. Saphenofemoral Junction: No evidence of thrombus. Normal compressibility and flow on color Doppler imaging. Profunda Femoral Vein: No evidence of thrombus. Normal compressibility and flow on color Doppler imaging. Femoral Vein: No evidence of thrombus. Normal compressibility, respiratory phasicity and response to augmentation. Popliteal Vein: No evidence of thrombus. Normal compressibility, respiratory phasicity and response to augmentation. Calf Veins: No evidence of thrombus. Normal compressibility and flow on color Doppler imaging. Superficial Great Saphenous Vein: No evidence of thrombus. Normal compressibility. Venous Reflux:  None. Other Findings: There is a minimal to moderate amount of subcutaneous edema at the level of the left calf. IMPRESSION: No evidence of DVT within either lower extremity. Electronically Signed   By: Sandi Mariscal M.D.   On: 03/04/2021 11:56   DG Chest Port 1 View  Result Date: 03/02/2021 CLINICAL DATA:  Shortness of breath Gain 30 pound in 4 days Diffuse swelling EXAM: PORTABLE CHEST 1 VIEW COMPARISON:  02/02/2021 FINDINGS: The heart size and mediastinal contours are within normal limits. Both lungs are clear. The visualized skeletal structures are unremarkable. IMPRESSION: No active disease. Electronically Signed   By: Miachel Roux M.D.   On:  03/02/2021 14:19   ECHOCARDIOGRAM COMPLETE  Result Date: 03/06/2021    ECHOCARDIOGRAM REPORT   Patient Name:   LURETHA EBERLY Date of Exam: 03/06/2021 Medical Rec #:  [id number]        Height:       59.0 in Accession #:    [id number]       Weight:       140.7 lb Date of Birth:  [birth year]        BSA:           1.588 m Patient Age:    24 years         BP:           134/96 mmHg Patient Gender: F                HR:           96 bpm. Exam Location:  [facility] Procedure: 2D Echo Indications:    Bacteremia R78.81  History:        Patient has prior history of Echocardiogram examinations, most                 recent 10/19/2020. Risk Factors:Non-Smoker, Diabetes and                 Hypertension. GERD, Pneumonia.  Sonographer:    Leavy Cella RDCS (AE) Referring Phys: [place]  1. Moderate LVH, myocardium with somewhat speckled appearance. Mild valve thickening. Trivial pericardial effusion. Findings could suggest infiltrative cardiac disease. Consider outpatient cardiac MRI for further evaluation. . Left ventricular ejection fraction, by estimation, is 65 to 70%. The left ventricle has normal function. The left ventricle has no regional wall motion abnormalities. There is moderate left ventricular hypertrophy. Left ventricular diastolic parameters were normal.  2. Right ventricular systolic function is normal. The right ventricular size is normal.  3. The pericardial effusion is circumferential.  4. The mitral valve is abnormal. Trivial mitral valve regurgitation. No evidence of mitral stenosis.  5. The aortic valve is tricuspid. There is mild thickening of the aortic valve. Aortic valve regurgitation is not visualized. No aortic stenosis is present.  6. The inferior vena cava is normal in size with greater than 50% respiratory variability, suggesting right atrial pressure of 3 mmHg. FINDINGS  Left Ventricle: Moderate LVH, myocardium with somewhat speckled appearance. Mild valve thickening. Trivial pericardial effusion. Findings could suggest infiltrative cardiac disease. Consider outpatient cardiac MRI for further evaluation. Left ventricular ejection fraction, by estimation, is 65 to 70%. The left ventricle has normal function. The left ventricle has no regional wall motion abnormalities. The  left ventricular internal cavity size was normal in size. There is moderate left ventricular hypertrophy. Left ventricular diastolic parameters were normal. Right Ventricle: The right ventricular size is normal. No increase in right ventricular wall thickness. Right ventricular systolic function is normal. Left Atrium: Left atrial size was normal in size. Right Atrium: Right atrial size was normal in size. Pericardium: Trivial pericardial effusion is present. The pericardial effusion is circumferential. Mitral Valve: The mitral valve is abnormal. There is mild thickening of the mitral valve leaflet(s). Trivial mitral valve regurgitation. No evidence of mitral valve stenosis. Tricuspid Valve: The tricuspid valve is normal in structure. Tricuspid valve regurgitation is not demonstrated. No evidence of tricuspid stenosis. Aortic Valve: The aortic valve is tricuspid. There is mild thickening of the aortic valve. Aortic valve regurgitation is not visualized. No aortic stenosis is present. Aortic valve mean gradient measures  4.8 mmHg. Aortic valve peak gradient measures 8.7 mmHg. Aortic valve area, by VTI measures 2.07 cm. Pulmonic Valve: The pulmonic valve was not well visualized. Pulmonic valve regurgitation is not visualized. No evidence of pulmonic stenosis. Aorta: The aortic root is normal in size and structure. Pulmonary Artery: Indeterminant PASP, inadequate TR jet. Venous: The inferior vena cava is normal in size with greater than 50% respiratory variability, suggesting right atrial pressure of 3 mmHg. IAS/Shunts: No atrial level shunt detected by color flow Doppler.  LEFT VENTRICLE PLAX 2D LVIDd:         3.42 cm  Diastology LVIDs:         1.76 cm  LV e' medial:    12.50 cm/s LV PW:         1.40 cm  LV E/e' medial:  5.9 LV IVS:        1.35 cm  LV e' lateral:   13.80 cm/s LVOT diam:     1.70 cm  LV E/e' lateral: 5.4 LV SV:         56 LV SV Index:   35 LVOT Area:     2.27 cm  RIGHT VENTRICLE RV S prime:     19.20  cm/s TAPSE (M-mode): 2.9 cm LEFT ATRIUM             Index       RIGHT ATRIUM           Index LA diam:        3.20 cm 2.02 cm/m  RA Area:     10.40 cm LA Vol (A2C):   40.0 ml 25.19 ml/m RA Volume:   21.10 ml  13.29 ml/m LA Vol (A4C):   27.6 ml 17.38 ml/m LA Biplane Vol: 33.0 ml 20.78 ml/m  AORTIC VALVE AV Area (Vmax):    1.99 cm AV Area (Vmean):   2.07 cm AV Area (VTI):     2.07 cm AV Vmax:           147.27 cm/s AV Vmean:          104.500 cm/s AV VTI:            0.270 m AV Peak Grad:      8.7 mmHg AV Mean Grad:      4.8 mmHg LVOT Vmax:         129.02 cm/s LVOT Vmean:        95.318 cm/s LVOT VTI:          0.247 m LVOT/AV VTI ratio: 0.91  AORTA Ao Root diam: 2.00 cm MITRAL VALVE MV Area (PHT): 3.97 cm    SHUNTS MV Decel Time: 191 msec    Systemic VTI:  0.25 m MV E velocity: 74.00 cm/s  Systemic Diam: 1.70 cm MV A velocity: 71.30 cm/s MV E/A ratio:  1.04 [name] MD Electronically signed by [name] MD Signature Date/Time: 03/06/2021/10:32:44 AM    Final    ECHO TEE  Result Date: 03/09/2021    TRANSESOPHOGEAL ECHO REPORT   Patient Name:   [name] Date of Exam: 03/09/2021 Medical Rec #:  [id number]        Height:       59.0 in Accession #:    [id number]       Weight:       134.0 lb Date of Birth:  26-Nov-1996        BSA:          1.556 m Patient Age:  24 years         BP:           235/107 mmHg Patient Gender: F                HR:           104 bpm. Exam Location:  [facility] Procedure: 2D Echo Indications:    Bacteremia 790.7 / R78.81  History:        Patient has prior history of Echocardiogram examinations, most                 recent 03/06/2021. Risk Factors:Non-Smoker, Hypertension and                 Diabetes. Pneumonia.  Sonographer:    Leavy Cella RDCS (AE) Referring Phys: [id number] Effie: The transesophogeal probe was passed without difficulty through the esophogus of the patient. Sedation performed by different physician. The patient developed no  complications during the procedure. IMPRESSIONS  1. Left ventricular ejection fraction, by estimation, is 60 to 65%. The left ventricle has normal function.  2. Right ventricular systolic function is normal. The right ventricular size is normal.  3. No left atrial/left atrial appendage thrombus was detected. The LAA emptying velocity was 50 cm/s.  4. The mitral valve is normal in structure. No evidence of mitral valve regurgitation. No evidence of mitral stenosis.  5. The aortic valve is tricuspid. Aortic valve regurgitation is not visualized. No aortic stenosis is present.  6. The inferior vena cava is normal in size with greater than 50% respiratory variability, suggesting right atrial pressure of 3 mmHg. Conclusion(s)/Recommendation(s): No evidence of vegetation/infective endocarditis on this transesophageal echocardiogram. FINDINGS  Left Ventricle: Left ventricular ejection fraction, by estimation, is 60 to 65%. The left ventricle has normal function. The left ventricular internal cavity size was normal in size. Right Ventricle: The right ventricular size is normal. No increase in right ventricular wall thickness. Right ventricular systolic function is normal. Left Atrium: Left atrial size was normal in size. No left atrial/left atrial appendage thrombus was detected. The LAA emptying velocity was 50 cm/s. Right Atrium: Right atrial size was normal in size. Pericardium: There is no evidence of pericardial effusion. Mitral Valve: The mitral valve is normal in structure. No evidence of mitral valve regurgitation. No evidence of mitral valve stenosis. Tricuspid Valve: The tricuspid valve is normal in structure. Tricuspid valve regurgitation is mild . No evidence of tricuspid stenosis. Aortic Valve: The aortic valve is tricuspid. Aortic valve regurgitation is not visualized. No aortic stenosis is present. Pulmonic Valve: The pulmonic valve was normal in structure. Pulmonic valve regurgitation is not visualized. No  evidence of pulmonic stenosis. Aorta: The aortic root is normal in size and structure. Venous: The inferior vena cava is normal in size with greater than 50% respiratory variability, suggesting right atrial pressure of 3 mmHg. IAS/Shunts: No atrial level shunt detected by color flow Doppler. [name] MD Electronically signed by [name] MD Signature Date/Time: 03/09/2021/12:13:16 PM    Final    US BIOPSY (KIDNEY)  Result Date: 03/07/2021 INDICATION: 25 year old female with a history of nephrotic syndrome EXAM: IMAGE GUIDED MEDICAL RENAL BIOPSY MEDICATIONS: None. ANESTHESIA/SEDATION: Moderate (conscious) sedation was employed during this procedure. A total of Versed 2.0 mg and Fentanyl 50 mcg was administered intravenously. Moderate Sedation Time: 13 minutes. The patient's level of consciousness and vital signs were monitored continuously by radiology nursing throughout the procedure under my direct supervision. FLUOROSCOPY TIME:  Ultrasound COMPLICATIONS: None PROCEDURE: Informed written consent was obtained from the patient after a thorough discussion of the procedural risks, benefits and alternatives. All questions were addressed. Maximal Sterile Barrier Technique was utilized including caps, mask, sterile gowns, sterile gloves, sterile drape, hand hygiene and skin antiseptic. A timeout was performed prior to the initiation of the procedure. Patient was positioned prone position on the gantry table. Images were stored sent to PACs. Once the patient is prepped and draped in the usual sterile fashion, the skin and subcutaneous tissues overlying the left kidney were generously infiltrated 1% lidocaine for local anesthesia. Using ultrasound guidance, a 15 gauge guide needle was advanced into the lower cortex of the left kidney. Once we confirmed location of the needle tip, 2 separate 16 gauge core biopsy were achieved. Two Gel-Foam pledgets were infused with a small amount of saline. The needle was  removed. Final images were stored. The patient tolerated the procedure well and remained hemodynamically stable throughout. No complications were encountered and no significant blood loss encountered. IMPRESSION: Status post image guided medical renal biopsy of the left kidney. Signed, Dulcy Fanny. Dellia Nims, RPVI [facility] Electronically Signed   By: Corrie Mckusick D.O.   On: 03/07/2021 12:38   [place] EKG SITE RITE  Result Date: 03/09/2021 If Site Rite image not attached, placement could not be confirmed due to current cardiac rhythm.   Microbiology: Recent Results (from the past 240 hour(s))  SARS CORONAVIRUS 2 (TAT 6-24 HRS) Nasopharyngeal Nasopharyngeal Swab     Status: None   Collection Time: 03/16/21 10:02 PM   Specimen: Nasopharyngeal Swab  Result Value Ref Range Status   SARS Coronavirus 2 NEGATIVE NEGATIVE Final    Comment: (NOTE) SARS-CoV-2 target nucleic acids are NOT DETECTED.  The SARS-CoV-2 RNA is generally detectable in upper and lower respiratory specimens during the acute phase of infection. Negative results do not preclude SARS-CoV-2 infection, do not rule out co-infections with other pathogens, and should not be used as the sole basis for treatment or other patient management decisions. Negative results must be combined with clinical observations, patient history, and epidemiological information. The expected result is Negative.  Fact Sheet for Patients: SugarRoll.be  Fact Sheet for Healthcare Providers: https://www.woods-mathews.com/  This test is not yet approved or cleared by the [place] FDA and  has been authorized for detection and/or diagnosis of SARS-CoV-2 by FDA under an Emergency Use Authorization (EUA). This EUA will remain  in effect (meaning this test can be used) for the duration of the COVID-19 declaration under Se ction 564(b)(1) of the Act, 21  U.S.C. section 360bbb-3(b)(1), unless the authorization is terminated or revoked sooner.  Performed at [facility], [place] [address], [place], [place] [address]   Culture, blood (routine x 2)     Status: None (Preliminary result)   Collection Time: 03/16/21 10:23 PM   Specimen: BLOOD LEFT HAND  Result Value Ref Range Status   Specimen Description BLOOD LEFT HAND  Final   Special Requests   Final    BOTTLES DRAWN AEROBIC AND ANAEROBIC Blood Culture adequate volume   Culture   Final    NO GROWTH 3 DAYS Performed at [facility], [address] [place], [place] [address]    Report Status PENDING  Incomplete  Culture, blood (routine x 2)     Status: None (Preliminary result)   Collection Time: 03/16/21 11:08 PM   Specimen: BLOOD LEFT HAND  Result Value Ref Range  Status   Specimen Description BLOOD LEFT HAND  Final   Special Requests   Final    BOTTLES DRAWN AEROBIC AND ANAEROBIC Blood Culture adequate volume   Culture   Final    NO GROWTH 3 DAYS Performed at [facility], [address] [place], [place] [address]    Report Status PENDING  Incomplete     Labs: Basic Metabolic Panel: Recent Labs  Lab 03/16/21 1631 03/17/21 0519 03/18/21 0425 03/19/21 0548  NA 135 136 135 139  K 4.1 3.8 3.5 3.5  CL 97* 103 102 104  CO2 26 24 26 27   GLUCOSE 458* 190* 159* 83  BUN 13 10 9 7   CREATININE 0.83 0.84 1.04* 0.96  CALCIUM 8.6* 7.9* 8.2* 8.1*   Liver Function Tests: Recent Labs  Lab 03/16/21 1631  AST 16  ALT 6  ALKPHOS 167*  BILITOT 0.6  PROT 7.3  ALBUMIN 2.2*   Recent Labs  Lab 03/16/21 1631  LIPASE 21   CBC: Recent Labs  Lab 03/16/21 1631 03/17/21 0519 03/19/21 0548  WBC 8.5 6.9 5.7  HGB 9.6* 8.3* 9.0*  HCT 30.5* 27.1* 29.0*  MCV 93.8 95.4 95.1  PLT 452* 394 398   BNP (last 3 results) Recent Labs    01/23/21 1806 01/30/21 1007 03/02/21 1419  BNP 44.0 76.0 89.0   CBG: Recent Labs  Lab 03/18/21 2026 03/19/21 0013 03/19/21 0810  03/19/21 0855 03/19/21 1200  GLUCAP 120* 105* 48* 142* 112*   Signed:  Barton Dubois MD.  [facility] 03/19/2021, 1:44 PM

## 2021-03-19 NOTE — Progress Notes (Signed)
Patient stated she gets her dressing changed every Monday.  Would prefer if we changed her dressing on Monday or before discharge. Explained importance of dressing change. Patient aware of risks of infection if dressing not changed, but patient adamant that her aid comes on Monday to change her dressing.  Verbalized understanding.

## 2021-03-19 NOTE — Progress Notes (Signed)
Nsg Discharge Note  Admit Date:  03/16/2021 Discharge date: 03/19/2021   Diamond [name] to be D/C'd Home per MD order.  AVS completed.  Copy for chart, and copy for patient signed, and dated. Patient/caregiver able to verbalize understanding.  Discharge Medication: Allergies as of 03/19/2021      Reactions   Amoxicillin Hives   Did it involve swelling of the face/tongue/throat, SOB, or low BP? no  Did it involve sudden or severe rash/hives, skin peeling, or any reaction on the inside of your mouth or nose? no Did you need to seek medical attention at a hospital or doctor's office? no When did it last happen?unk If all above answers are "NO", may proceed with cephalosporin use.   Dexamethasone Hives, Itching   Doxycycline Itching, Swelling   Penicillins Hives, Itching   Did it involve swelling of the face/tongue/throat, SOB, or low BP? no  Did it involve sudden or severe rash/hives, skin peeling, or any reaction on the inside of your mouth or nose? no Did you need to seek medical attention at a hospital or doctor's office? no When did it last happen?unk If all above answers are "NO", may proceed with cephalosporin use. Did it involve swelling of the face/tongue/throat, SOB, or low BP? no  Did it involve sudden or severe rash/hives, skin peeling, or any reac   Ceftriaxone Sodium In Dextrose Itching   Augmentin [amoxicillin-pot Clavulanate] Other (See Comments)   Reaction unknown   Clavulanic Acid    Adhesive [tape] Itching, Rash   Latex Itching, Rash   Other Itching, Rash   Pineapple Rash   Rash on tongue and throat      Medication List    TAKE these medications   albuterol 108 (90 Base) MCG/ACT inhaler Commonly known as: VENTOLIN HFA Inhale 2 puffs into the lungs every 6 (six) hours as needed for wheezing or shortness of breath. For shortness of breath   Basaglar KwikPen 100 UNIT/ML Inject 30 Units into the skin at bedtime. What changed: how much to take    ceFAZolin  IVPB Commonly known as: ANCEF Inject 2 g into the vein every 8 (eight) hours. Indication:  Bacteremia, possible discitis First Dose: No Last Day of Therapy:  04/17/21 Labs - Once weekly:  CBC/D and BMP, Labs - Every other week:  ESR and CRP Method of administration: IV Push Method of administration may be changed at the discretion of home infusion pharmacist based upon assessment of the patient and/or caregiver's ability to self-administer the medication ordered.   citalopram 10 MG tablet Commonly known as: CELEXA Take 10 mg by mouth daily.   diphenhydrAMINE 12.5 MG/5ML elixir Commonly known as: BENADRYL Take 10 mLs (25 mg total) by mouth every 6 (six) hours as needed for itching, allergies or sleep.   gabapentin 100 MG capsule Commonly known as: NEURONTIN Take 1 capsule (100 mg total) by mouth 2 (two) times daily.   hydrALAZINE 25 MG tablet Commonly known as: APRESOLINE Take 1 tablet (25 mg total) by mouth 2 (two) times daily.   insulin lispro 100 UNIT/ML KwikPen Commonly known as: HUMALOG Inject 10-16 Units into the skin 3 (three) times daily before meals.   isosorbide mononitrate 30 MG 24 hr tablet Commonly known as: IMDUR Take 0.5 tablets (15 mg total) by mouth daily. What changed: how much to take   lisinopril 10 MG tablet Commonly known as: ZESTRIL Take 1 tablet (10 mg total) by mouth daily. For Renal Protection/Proteinuria   metoCLOPramide 5 MG  tablet Commonly known as: Reglan Take 1 tablet (5 mg total) by mouth every 8 (eight) hours as needed for refractory nausea / vomiting (refractory symptoms).   metoprolol succinate 25 MG 24 hr tablet Commonly known as: TOPROL-XL Take 0.5 tablets (12.5 mg total) by mouth daily. Start taking on: March 20, 2021   omeprazole 40 MG capsule Commonly known as: PRILOSEC Take 1 capsule (40 mg total) by mouth in the morning and at bedtime. What changed: when to take this   ondansetron 8 MG disintegrating  tablet Commonly known as: Zofran ODT Take 1 tablet (8 mg total) by mouth every 8 (eight) hours as needed for nausea or vomiting.   traZODone 50 MG tablet Commonly known as: DESYREL Take 1 tablet (50 mg total) by mouth at bedtime.       Discharge Assessment: Vitals:   03/19/21 1014 03/19/21 1529  BP: (!) 134/98 (!) 136/92  Pulse: (!) 109 (!) 105  Resp:  16  Temp:  98.9 F (37.2 C)  SpO2:  97%   Skin clean, dry and intact without evidence of skin break down, no evidence of skin tears noted. IV catheter discontinued intact. Site without signs and symptoms of complications - no redness or edema noted at insertion site, patient denies c/o pain - only slight tenderness at site.  Dressing with slight pressure applied.  D/c Instructions-Education: Discharge instructions given to patient/family with verbalized understanding. D/c education completed with patient/family including follow up instructions, medication list, d/c activities limitations if indicated, with other d/c instructions as indicated by MD - patient able to verbalize understanding, all questions fully answered. Patient instructed to return to ED, call 911, or call MD for any changes in condition.  Patient escorted via [place], and D/C home via private auto.  Radene Gunning King,LPN 4/65/0354 6:56 PM

## 2021-03-19 NOTE — Progress Notes (Signed)
Patient discharged home with PICC line, will resume IV antibiotics as an outpatient as per OPAT. [facility] to follow up with patient at home.

## 2021-03-19 NOTE — Progress Notes (Signed)
Pharmacy Antibiotic Note  Diamond [name] is a 25 y.o. female admitted on 03/16/2021 with bacteremia and possible discitis.   Pharmacy has been consulted for Cefazolin dosing, continuing home dose which is needed until 5/15 per previous OPAT.   Plan: Continue Cefazolin 2gm IV q8h F/u cxs and clinical progress Monitor V/S, labs.      Temp (24hrs), Avg:98.5 F (36.9 C), Min:97.8 F (36.6 C), Max:99.2 F (37.3 C)  Recent Labs  Lab 03/16/21 1631 03/17/21 0519 03/18/21 0425 03/19/21 0548  WBC 8.5 6.9  --  5.7  CREATININE 0.83 0.84 1.04* 0.96    Estimated Creatinine Clearance: 71.8 mL/min (by C-G formula based on SCr of 0.96 mg/dL).    Allergies  Allergen Reactions  . Amoxicillin Hives    Did it involve swelling of the face/tongue/throat, SOB, or low BP? no  Did it involve sudden or severe rash/hives, skin peeling, or any reaction on the inside of your mouth or nose? no Did you need to seek medical attention at a hospital or doctor's office? no When did it last happen?unk If all above answers are "NO", may proceed with cephalosporin use.  Marland Kitchen Dexamethasone Hives and Itching  . Doxycycline Itching and Swelling  . Penicillins Hives and Itching    Did it involve swelling of the face/tongue/throat, SOB, or low BP? no  Did it involve sudden or severe rash/hives, skin peeling, or any reaction on the inside of your mouth or nose? no Did you need to seek medical attention at a hospital or doctor's office? no When did it last happen?unk If all above answers are "NO", may proceed with cephalosporin use. Did it involve swelling of the face/tongue/throat, SOB, or low BP? no  Did it involve sudden or severe rash/hives, skin peeling, or any reac  . Ceftriaxone Sodium In Dextrose Itching  . Augmentin [Amoxicillin-Pot Clavulanate] Other (See Comments)    Reaction unknown  . Clavulanic Acid   . Adhesive [Tape] Itching and Rash  . Latex Itching and Rash  . Other Itching and Rash   . Pineapple Rash    Rash on tongue and throat    Antimicrobials this admission: Levaquin 4/3 >> 4/4 Daptomycin 4/4>> 4/5 Cefazolin 4/5>>  Microbiology results: 4/3 BCx: MSSA Staphylococcus aureus (BCID) NOT DETECTED DETECTEDAbnormal   4/14 BCx: ngtd  Thank you for allowing pharmacy to be a part of this patient's care.  Margot Ables, PharmD Clinical Pharmacist 03/19/2021 8:58 AM

## 2021-03-21 ENCOUNTER — Encounter (HOSPITAL_COMMUNITY): Payer: Self-pay

## 2021-03-21 LAB — CULTURE, BLOOD (ROUTINE X 2)
Culture: NO GROWTH
Culture: NO GROWTH
Special Requests: ADEQUATE
Special Requests: ADEQUATE

## 2021-03-23 ENCOUNTER — Encounter: Payer: Self-pay | Admitting: Physician Assistant

## 2021-03-23 ENCOUNTER — Ambulatory Visit: Payer: Medicaid Other | Admitting: Physician Assistant

## 2021-03-23 VITALS — BP 180/123 | Temp 97.6°F | Wt 134.0 lb

## 2021-03-23 DIAGNOSIS — I10 Essential (primary) hypertension: Secondary | ICD-10-CM

## 2021-03-23 DIAGNOSIS — E1065 Type 1 diabetes mellitus with hyperglycemia: Secondary | ICD-10-CM

## 2021-03-23 DIAGNOSIS — E1021 Type 1 diabetes mellitus with diabetic nephropathy: Secondary | ICD-10-CM

## 2021-03-23 DIAGNOSIS — K50919 Crohn's disease, unspecified, with unspecified complications: Secondary | ICD-10-CM

## 2021-03-23 DIAGNOSIS — R601 Generalized edema: Secondary | ICD-10-CM

## 2021-03-23 MED ORDER — METOPROLOL TARTRATE 50 MG PO TABS: 50.0000 mg | ORAL_TABLET | Freq: Two times a day (BID) | ORAL | 0 refills | Status: DC

## 2021-03-23 MED ORDER — OMEPRAZOLE 40 MG PO CPDR: 40.0000 mg | DELAYED_RELEASE_CAPSULE | Freq: Two times a day (BID) | ORAL | 0 refills | Status: DC

## 2021-03-23 NOTE — Patient Instructions (Addendum)
Restart lasix 32m one daily and potassium one daily  Increase the toprol xl 234mto one tablet daily

## 2021-03-23 NOTE — Progress Notes (Signed)
BP (!) 180/123   Temp 97.6 F (36.4 C)   Wt 134 lb (60.8 kg)   SpO2 98%   BMI 27.06 kg/m    Subjective:    Patient ID: Diamond [name], female    DOB: Nov 26, 1996, 25 y.o.   MRN: [id number]  HPI: Diamond [name] is a 25 y.o. female presenting on 03/23/2021 for No chief complaint on file.   HPI    Pt had a negative covid 19 screening questionnaire.   Pt is 20yoF who presents for follow up from recent hospitalization.   She has been hospitalized three times since she was seen here on 02/15/21  Otho Ket, a community paramedic, accompanies pt today.   Pt has uncontrolled type 1 diabetes, Crohns disease, diabetic nephropathy, anasarca and nephrotic symdrome.  She currently has a PICC line and is receiving IV antibiotics for sepsis.   They stopped her lasix (and K+) and hctz and she doesn't know why  She says she has Amenorrhea; she says her LMP was last April.  She has Never had a pap.   She is not currently sexually active and states the Last time she had intercourse was more than a year ago.   She has a bp machine at home.  It checks pulse also.  Pt c/o CP and HA.  She says the cp has been going.  Fluttering just started 2-3 wek ago when in hospital.   During hospitalization when she was discharged on 03/10/21,  "Possible infiltrative Cardiac Disease- TTE from 03/06/21 withModerate LVH, myocardium with somewhat speckled appearance. Mild valve  thickening. Trivial pericardial effusion. Findings could suggest  infiltrative cardiac disease. Consideroutpatient cardiac MRI for further  evaluation. . Left ventricular ejection is 65 to 70 %"   Pt doesn't have any idea what is going on with her and says none of the doctors at the hosopital would tell her.   Pt had a Kidney biopsy- 03/07/21- diabetic nephropathy    Relevant past medical, surgical, family and social history reviewed and updated as indicated. Interim medical history since our last visit reviewed. Allergies and  medications reviewed and updated.    Current Outpatient Medications:  .  albuterol (VENTOLIN HFA) 108 (90 Base) MCG/ACT inhaler, Inhale 2 puffs into the lungs every 6 (six) hours as needed for wheezing or shortness of breath. For shortness of breath, Disp: 3 each, Rfl: 0 .  ceFAZolin (ANCEF) IVPB, Inject 2 g into the vein every 8 (eight) hours. Indication:  Bacteremia, possible discitis First Dose: No Last Day of Therapy:  04/17/21 Labs - Once weekly:  CBC/D and BMP, Labs - Every other week:  ESR and CRP Method of administration: IV Push Method of administration may be changed at the discretion of home infusion pharmacist based upon assessment of the patient and/or caregiver's ability to self-administer the medication ordered., Disp: 117 Units, Rfl: 0 .  citalopram (CELEXA) 10 MG tablet, Take 10 mg by mouth daily., Disp: , Rfl:  .  diphenhydrAMINE (BENADRYL) 12.5 MG/5ML elixir, Take 10 mLs (25 mg total) by mouth every 6 (six) hours as needed for itching, allergies or sleep., Disp: 180 mL, Rfl: 0 .  gabapentin (NEURONTIN) 100 MG capsule, Take 1 capsule (100 mg total) by mouth 2 (two) times daily., Disp: 180 capsule, Rfl: 5 .  hydrALAZINE (APRESOLINE) 25 MG tablet, Take 1 tablet (25 mg total) by mouth 2 (two) times daily., Disp: 90 tablet, Rfl: 3 .  insulin glargine (LANTUS) 100 UNIT/ML injection, Inject 45 Units  into the skin daily., Disp: , Rfl:  .  Insulin Glulisine (APIDRA [place]), Inject into the skin. ssi, Disp: , Rfl:  .  isosorbide mononitrate (IMDUR) 30 MG 24 hr tablet, Take 0.5 tablets (15 mg total) by mouth daily., Disp: , Rfl:  .  metoCLOPramide (REGLAN) 5 MG tablet, Take 1 tablet (5 mg total) by mouth every 8 (eight) hours as needed for refractory nausea / vomiting (refractory symptoms)., Disp: 90 tablet, Rfl: 0 .  metoprolol succinate (TOPROL-XL) 25 MG 24 hr tablet, Take 0.5 tablets (12.5 mg total) by mouth daily., Disp: 60 tablet, Rfl: 1 .  omeprazole (PRILOSEC) 40 MG capsule, Take 1 capsule  (40 mg total) by mouth in the morning and at bedtime., Disp: 60 capsule, Rfl: 3 .  ondansetron (ZOFRAN ODT) 8 MG disintegrating tablet, Take 1 tablet (8 mg total) by mouth every 8 (eight) hours as needed for nausea or vomiting., Disp: 20 tablet, Rfl: 2 .  traZODone (DESYREL) 50 MG tablet, Take 1 tablet (50 mg total) by mouth at bedtime., Disp: 30 tablet, Rfl: 1 .  Insulin Glargine (BASAGLAR KWIKPEN) 100 UNIT/ML, Inject 30 Units into the skin at bedtime. (Patient not taking: Reported on 03/23/2021), Disp: , Rfl:  .  insulin lispro (HUMALOG) 100 UNIT/ML KwikPen, Inject 10-16 Units into the skin 3 (three) times daily before meals. (Patient not taking: Reported on 03/23/2021), Disp: 15 mL, Rfl: 11 .  lisinopril (ZESTRIL) 10 MG tablet, Take 1 tablet (10 mg total) by mouth daily. For Renal Protection/Proteinuria (Patient not taking: Reported on 03/23/2021), Disp: 90 tablet, Rfl: 5     Review of Systems  Per HPI unless specifically indicated above     Objective:    BP (!) 180/123   Temp 97.6 F (36.4 C)   Wt 134 lb (60.8 kg)   SpO2 98%   BMI 27.06 kg/m   Wt Readings from Last 3 Encounters:  03/23/21 134 lb (60.8 kg)  03/10/21 134 lb 4.2 oz (60.9 kg)  02/15/21 131 lb 8 oz (59.6 kg)    Physical Exam Vitals reviewed.  Constitutional:      General: She is not in acute distress.    Appearance: She is well-developed. She is not toxic-appearing.  HENT:     Head: Normocephalic and atraumatic.  Cardiovascular:     Rate and Rhythm: Normal rate and regular rhythm.  Pulmonary:     Effort: Pulmonary effort is normal.     Breath sounds: Normal breath sounds.  Abdominal:     General: Bowel sounds are normal.     Palpations: Abdomen is soft.  Musculoskeletal:     Cervical back: Neck supple.     Right lower leg: Edema present.     Left lower leg: Edema present.  Lymphadenopathy:     Cervical: No cervical adenopathy.  Skin:    General: Skin is warm and dry.  Neurological:     Mental Status:  She is alert and oriented to person, place, and time.  Psychiatric:        Behavior: Behavior normal.                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    -Increase metoprolol and change to immediate release (as the XR is not available from [facility]).  Pt is to monitor her bp at home.  She is to notify office if it doesn't go down with  increasing the metoprolol and restarting the lasix  -pt was referred to [facility] on 02/21/21 and has not yet been scheduled.  A call to that office reports that appointment should be made this week.    -Pt to call gyn to schedule PAP and discuss amenorrhea; she has family planning medicaid  -Refer to cardiology for possible infiltrative cardiac disease as addressesd in hospitalization with discharge 03/10/21.  Also to get assistance with htn in light of her renal disease  -restrart lasix 55m qd (with K+)  -pt to continue with endocrinology for uncontrolled diabetes  -pt to continue with ID for sepsis / IV antibiotics  -pt to continue with GI for her Crohns  -pt is again encouraged to apply for medicaid which would give her better options for controlling her multiple medical conditions  -Follow up 5/17 here as scheduled.  She is to contact office sooner prn

## 2021-03-26 ENCOUNTER — Emergency Department (HOSPITAL_COMMUNITY)
Admission: EM | Admit: 2021-03-26 | Discharge: 2021-03-26 | Disposition: A | Discharge status: Home / Self Care | Payer: Self-pay | Attending: Emergency Medicine | Admitting: Emergency Medicine

## 2021-03-26 ENCOUNTER — Other Ambulatory Visit: Payer: Self-pay

## 2021-03-26 ENCOUNTER — Encounter (HOSPITAL_COMMUNITY): Payer: Self-pay

## 2021-03-26 DIAGNOSIS — Z79899 Other long term (current) drug therapy: Secondary | ICD-10-CM | POA: Insufficient documentation

## 2021-03-26 DIAGNOSIS — Y840 Cardiac catheterization as the cause of abnormal reaction of the patient, or of later complication, without mention of misadventure at the time of the procedure: Secondary | ICD-10-CM | POA: Insufficient documentation

## 2021-03-26 DIAGNOSIS — T82838A Hemorrhage of vascular prosthetic devices, implants and grafts, initial encounter: Secondary | ICD-10-CM | POA: Insufficient documentation

## 2021-03-26 DIAGNOSIS — Z794 Long term (current) use of insulin: Secondary | ICD-10-CM | POA: Insufficient documentation

## 2021-03-26 DIAGNOSIS — E1040 Type 1 diabetes mellitus with diabetic neuropathy, unspecified: Secondary | ICD-10-CM | POA: Insufficient documentation

## 2021-03-26 DIAGNOSIS — I10 Essential (primary) hypertension: Secondary | ICD-10-CM | POA: Insufficient documentation

## 2021-03-26 DIAGNOSIS — J45909 Unspecified asthma, uncomplicated: Secondary | ICD-10-CM | POA: Insufficient documentation

## 2021-03-26 DIAGNOSIS — Z91040 Latex allergy status: Secondary | ICD-10-CM | POA: Insufficient documentation

## 2021-03-26 LAB — CBG MONITORING, ED
Glucose-Capillary: 276 mg/dL — ABNORMAL HIGH (ref 70–99)
Glucose-Capillary: 252 mg/dL — ABNORMAL HIGH (ref 70–99)

## 2021-03-26 NOTE — ED Provider Notes (Signed)
[facility] Provider Note   CSN: [id number] Arrival date & time: 03/26/21  1824     History Chief Complaint  Patient presents with  . Vascular Access Problem    Diamond [name] is a 25 y.o. female.  HPI      Diamond [name] is a 25 y.o. female with history of Crohn's, type 1 diabetes, recently discharged from this hospital with orders for IV Ancef due to staph bacteremia, multiple focal pneumoina and listerial sepsis.  She was discharged home with a PICC line and presents to the Emergency Department requesting evaluation for possible bleeding from the site.  Noticed a small amount of blood last night and pain at the site when attempting to administer the antibiotics.  Believes she may have accidentally pulled at it.  She denies fever, chills, swelling of her arm or numbness.     Past Medical History:  Diagnosis Date  . ADHD (attention deficit hyperactivity disorder) 2007  . Allergy   . Asthma   . Bipolar disorder ([place])   . Crohn's colitis ([place])    Dx Dr. Cherrie Gauze  . Depression    early childhood, trial of seroquel?  . Diabetic neuropathy ([place])   . Edema   . GERD (gastroesophageal reflux disease)   . Hypertension   . Pancreatitis   . Panic attacks   . PTSD (post-traumatic stress disorder)   . Stroke ([place]) 07/2020   R side  . Type 1 diabetes ([facility]) 2008    Patient Active Problem List   Diagnosis Date Noted  . Low back pain without sciatica   . Intractable nausea and vomiting 03/16/2021  . Nausea and vomiting   . 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 Multifocal pneumonia 03/05/2021  . Bladder outlet obstruction   . Constipation   . Abdominal pain, epigastric   . Abnormal computed tomography of cecum and terminal ileum   . Hyperosmolar hyperglycemic state (HHS) ([place]) 01/30/2021  . Crohn's disease ([place]) 12/08/2020  . Gastroparesis 12/08/2020  . Essential  hypertension, benign 11/01/2020  . Edema 10/19/2020  . Anasarca 10/18/2020  . Hypoalbuminemia-due to proteinuria/nephrotic syndrome 10/18/2020  . Personal history of noncompliance with medical treatment, presenting hazards to health 10/12/2020  . Bilateral lower extremity edema 08/25/2020  . Pneumonia 08/25/2020  . Eczema 10/23/2012  . Abdominal pain 08/27/2012  . GERD (gastroesophageal reflux disease) 07/25/2012  . Mood disorder ([place]) 07/09/2012  . Contraception management 07/09/2012  . History of Crohn's disease 07/09/2012  . Uncontrolled type 1 diabetes mellitus with hyperglycemia and Diabetic Nephropathy 07/08/2012    Class: Chronic  . Asthma, persistent controlled 07/08/2012    Past Surgical History:  Procedure Laterality Date  . ADENOIDECTOMY    . BIOPSY  02/01/2021   Procedure: BIOPSY;  Surgeon: Rogene Houston, MD;  [facility];  Service: Endoscopy;;  duodenal, esophageal,  . BIOPSY  02/03/2021   Procedure: BIOPSY;  Surgeon: Eloise Harman, DO;  [facility];  Service: Endoscopy;;  . COLONOSCOPY WITH PROPOFOL N/A 02/01/2021   Procedure: COLONOSCOPY WITH PROPOFOL;  Surgeon: Rogene Houston, MD;  [facility];  Service: Endoscopy;  Laterality: N/A;  . COLONOSCOPY WITH PROPOFOL N/A 02/03/2021   Procedure: COLONOSCOPY WITH PROPOFOL;  Surgeon: Eloise Harman, DO;  [facility];  Service: Endoscopy;  Laterality: N/A;  . ESOPHAGOGASTRODUODENOSCOPY (EGD) WITH PROPOFOL N/A 02/01/2021   Procedure: ESOPHAGOGASTRODUODENOSCOPY (EGD) WITH  PROPOFOL;  Surgeon: Rogene Houston, MD;  [facility];  Service: Endoscopy;  Laterality: N/A;  . HERNIA REPAIR    . TEE WITHOUT CARDIOVERSION N/A 03/09/2021   Procedure: TRANSESOPHAGEAL ECHOCARDIOGRAM (TEE) WITH PROPOFOL;  Surgeon: Arnoldo Lenis, MD;  [facility];  Service: Endoscopy;  Laterality: N/A;  . TONSILLECTOMY       OB History   No obstetric history on file.      Family History  Problem Relation Age of Onset  . Hypertension Mother   . Diabetes Mother   . Heart disease Mother   . Crohn's disease Father   . Alcohol abuse Father   . Drug abuse Father   . Mental illness Father   . Cancer Father   . Hypertension Maternal Aunt   . Seizures Maternal Aunt   . Depression Maternal Aunt   . Asthma Maternal Uncle   . Hypertension Maternal Uncle   . Cancer Maternal Grandmother   . Diabetes Maternal Grandmother   . Mental illness Maternal Grandfather     Social History   Tobacco Use  . Smoking status: Never Smoker  . Smokeless tobacco: Never Used  Vaping Use  . Vaping Use: Former  Substance Use Topics  . Alcohol use: No  . Drug use: No    Home Medications Prior to Admission medications   Medication Sig Start Date End Date Taking? Authorizing Provider  albuterol (VENTOLIN HFA) 108 (90 Base) MCG/ACT inhaler Inhale 2 puffs into the lungs every 6 (six) hours as needed for wheezing or shortness of breath. For shortness of breath 03/10/21   Roxan Hockey, MD  ceFAZolin (ANCEF) IVPB Inject 2 g into the vein every 8 (eight) hours. Indication:  Bacteremia, possible discitis First Dose: No Last Day of Therapy:  04/17/21 Labs - Once weekly:  CBC/D and BMP, Labs - Every other week:  ESR and CRP Method of administration: IV Push Method of administration may be changed at the discretion of home infusion pharmacist based upon assessment of the patient and/or caregiver's ability to self-administer the medication ordered. 03/10/21 04/18/21  Roxan Hockey, MD  citalopram (CELEXA) 10 MG tablet Take 10 mg by mouth daily.    [provider]  diphenhydrAMINE (BENADRYL) 12.5 MG/5ML elixir Take 10 mLs (25 mg total) by mouth every 6 (six) hours as needed for itching, allergies or sleep. 03/10/21   Roxan Hockey, MD  gabapentin (NEURONTIN) 100 MG capsule Take 1 capsule (100 mg total) by mouth 2 (two) times daily. 03/10/21   Roxan Hockey, MD   hydrALAZINE (APRESOLINE) 25 MG tablet Take 1 tablet (25 mg total) by mouth 2 (two) times daily. 03/10/21   Roxan Hockey, MD  insulin glargine (LANTUS) 100 UNIT/ML injection Inject 45 Units into the skin daily.    [provider]  Insulin Glulisine (APIDRA [place]) Inject into the skin. ssi    [provider]  isosorbide mononitrate (IMDUR) 30 MG 24 hr tablet Take 0.5 tablets (15 mg total) by mouth daily. 03/19/21   Barton Dubois, MD  metoCLOPramide (REGLAN) 5 MG tablet Take 1 tablet (5 mg total) by mouth every 8 (eight) hours as needed for refractory nausea / vomiting (refractory symptoms). 03/19/21 03/19/22  Barton Dubois, MD  metoprolol tartrate (LOPRESSOR) 50 MG tablet Take 1 tablet (50 mg total) by mouth 2 (two) times daily. 03/23/21   [name], PA-C  omeprazole (PRILOSEC) 40 MG capsule Take 1 capsule (40 mg total) by mouth in the morning and at bedtime.  03/23/21   [name], PA-C  ondansetron (ZOFRAN ODT) 8 MG disintegrating tablet Take 1 tablet (8 mg total) by mouth every 8 (eight) hours as needed for nausea or vomiting. 03/10/21   Roxan Hockey, MD  traZODone (DESYREL) 50 MG tablet Take 1 tablet (50 mg total) by mouth at bedtime. 03/10/21   Roxan Hockey, MD    Allergies    Amoxicillin, Dexamethasone, Doxycycline, Penicillins, Ceftriaxone sodium in dextrose, Augmentin [amoxicillin-pot clavulanate], Clavulanic acid, Adhesive [tape], Latex, Other, and Pineapple  Review of Systems   Review of Systems  Constitutional: Negative for chills, fatigue and fever.  Respiratory: Negative for shortness of breath.   Cardiovascular: Negative for chest pain and palpitations.       PICC line right arm with small amt blood   Gastrointestinal: Negative for abdominal pain, nausea and vomiting.  Musculoskeletal: Positive for myalgias. Negative for arthralgias, neck pain and neck stiffness.  Skin: Negative for color change and rash.  Neurological: Negative for dizziness,  weakness and numbness.  Hematological: Does not bruise/bleed easily.    Physical Exam Updated Vital Signs BP (!) 128/104 (BP Location: Left Arm)   Pulse (!) 105   Temp 98.3 F (36.8 C) (Oral)   Resp 16   Ht 4' 11"  (1.499 m)   Wt 60.8 kg   SpO2 100%   BMI 27.06 kg/m   Physical Exam Vitals and nursing note reviewed.  Constitutional:      Appearance: Normal appearance. She is not toxic-appearing.  Cardiovascular:     Rate and Rhythm: Normal rate and regular rhythm.     Pulses: Normal pulses.     Comments: PICC line right arm with dirty appearing bandaged and very small amt of dried blood at the insertion site.  No actively bleeding.  No erythema or edema of the arm Pulmonary:     Effort: Pulmonary effort is normal.     Breath sounds: Normal breath sounds.  Musculoskeletal:        General: No swelling, tenderness or signs of injury.     Cervical back: Normal range of motion. No tenderness.  Skin:    General: Skin is warm.     Capillary Refill: Capillary refill takes less than 2 seconds.  Neurological:     General: No focal deficit present.     Mental Status: She is alert.     Sensory: No sensory deficit.     Motor: No weakness.     ED Results / Procedures / Treatments   Labs (all labs ordered are listed, but only abnormal results are displayed) Labs Reviewed  CBG MONITORING, ED - Abnormal; Notable for the following components:      Result Value   Glucose-Capillary 276 (*)    All other components within normal limits  CBG MONITORING, ED - Abnormal; Notable for the following components:   Glucose-Capillary 252 (*)    All other components within normal limits    EKG None  Radiology No results found.  Procedures Procedures   Medications Ordered in ED Medications - No data to display  ED Course  I have reviewed the triage vital signs and the nursing notes.  Pertinent labs & imaging results that were available during my care of the patient were reviewed by  me and considered in my medical decision making (see chart for details).    MDM Rules/Calculators/A&P  Patient here for evaluation of possible bleeding from her PICC line.  Has not administered her antibiotic dose today.  Neurovascularly intact.  No edema or active bleeding at the PICC line site.  Old bandaged removed, area of the PICC line was cleaned by nursing staff and new dressing applied.  Pt observed without further bleeding.  Line successfully  flushed without difficulty.  She has home health nurse coming to her home tomorrow and she is agreeable to close f/u with her ID provider.   Final Clinical Impression(s) / ED Diagnoses Final diagnoses:  Bleeding from PICC line, initial encounter [facility])    Rx / [place] Orders ED Discharge Orders    None       Kem Parkinson, PA-C 03/27/21 0001    Isla Pence, MD 03/28/21 1238

## 2021-03-26 NOTE — Discharge Instructions (Addendum)
Your PICC line site is no longer bleeding and has been flushed and appears to be working properly.  You may administer your antibiotics as directed.

## 2021-03-26 NOTE — ED Triage Notes (Signed)
Pt with MRSA has PICC line x 3 weeks for abx admin, states increased pain when trying to admin abx, believes she may have pulled it, some blood noted.

## 2021-03-26 NOTE — ED Notes (Signed)
Dried blood noted at PICC line insertion site; no active bleeding when flushed. Good blood return, easy to flush without s/s of infiltration. Pt does report tenderness when flushed. Dressing change performed. Pt tolerated well. States she has a home health nurse that will come out tomorrow to change dressing again and assess site. PA made aware.

## 2021-03-26 NOTE — ED Notes (Signed)
Discussed DC instructions with pt including to call her infectious disease specialist tomorrow and notify them of her visit today. Explained she should discuss the PICC site with there home health nurse in the morning as well to determine if her specialist wants to see her in the office.

## 2021-03-27 ENCOUNTER — Telehealth: Payer: Self-pay | Admitting: Physician Assistant

## 2021-03-27 NOTE — Telephone Encounter (Signed)
Diamond [name] called in stating her BP at the hospital yesterday was 126/105 and this morning when her nurse came to her house and checked it, it was 180/ 100 and something. Per patient. After speaking with [name] the provider I spoke with [name] and told her to keep monitoring her BP. Patient stated she did have away to check her BP and she would check it. Patient was told to go to ER if she had severe headache or confusion, Weakness or numbness, severe chest pain or trouble breathing. Patient is waiting for an appointment with cards at this time.

## 2021-03-28 ENCOUNTER — Encounter: Payer: Self-pay | Admitting: "Endocrinology

## 2021-03-28 ENCOUNTER — Ambulatory Visit (INDEPENDENT_AMBULATORY_CARE_PROVIDER_SITE_OTHER): Payer: Self-pay | Admitting: "Endocrinology

## 2021-03-28 ENCOUNTER — Telehealth: Payer: Self-pay | Admitting: Physician Assistant

## 2021-03-28 ENCOUNTER — Other Ambulatory Visit: Payer: Self-pay

## 2021-03-28 VITALS — BP 140/86 | HR 96 | Ht 59.0 in | Wt 125.0 lb

## 2021-03-28 DIAGNOSIS — E1065 Type 1 diabetes mellitus with hyperglycemia: Secondary | ICD-10-CM

## 2021-03-28 DIAGNOSIS — I10 Essential (primary) hypertension: Secondary | ICD-10-CM

## 2021-03-28 DIAGNOSIS — Z9119 Patient's noncompliance with other medical treatment and regimen: Secondary | ICD-10-CM

## 2021-03-28 DIAGNOSIS — Z91199 Patient's noncompliance with other medical treatment and regimen due to unspecified reason: Secondary | ICD-10-CM

## 2021-03-28 NOTE — Progress Notes (Signed)
03/28/2021, 3:47 PM  Endocrinology follow-up note   Subjective:    Patient ID: Diamond [name], female    DOB: 05/31/1996.  Diamond [name] is being seen in follow-up after she was seen in consultation for management of currently uncontrolled symptomatic type I diabetes. She is accompanied by her aide who is assisting her with her monitoring and insulin administration activities.    Past Medical History:  Diagnosis Date  . ADHD (attention deficit hyperactivity disorder) 2007  . Allergy   . Asthma   . Bipolar disorder ([place])   . Crohn's colitis ([place])    Dx Dr. Cherrie Gauze  . Depression    early childhood, trial of seroquel?  . Diabetic neuropathy ([place])   . Edema   . GERD (gastroesophageal reflux disease)   . Hypertension   . Pancreatitis   . Panic attacks   . PTSD (post-traumatic stress disorder)   . Stroke ([place]) 07/2020   R side  . Type 1 diabetes ([place]) 2008    Past Surgical History:  Procedure Laterality Date  . ADENOIDECTOMY    . BIOPSY  02/01/2021   Procedure: BIOPSY;  Surgeon: Rogene Houston, MD;  [facility];  Service: Endoscopy;;  duodenal, esophageal,  . BIOPSY  02/03/2021   Procedure: BIOPSY;  Surgeon: Eloise Harman, DO;  [facility];  Service: Endoscopy;;  . COLONOSCOPY WITH PROPOFOL N/A 02/01/2021   Procedure: COLONOSCOPY WITH PROPOFOL;  Surgeon: Rogene Houston, MD;  [facility];  Service: Endoscopy;  Laterality: N/A;  . COLONOSCOPY WITH PROPOFOL N/A 02/03/2021   Procedure: COLONOSCOPY WITH PROPOFOL;  Surgeon: Eloise Harman, DO;  [facility];  Service: Endoscopy;  Laterality: N/A;  . ESOPHAGOGASTRODUODENOSCOPY (EGD) WITH PROPOFOL N/A 02/01/2021   Procedure: ESOPHAGOGASTRODUODENOSCOPY (EGD) WITH PROPOFOL;  Surgeon: Rogene Houston, MD;  [facility];  Service: Endoscopy;  Laterality: N/A;  . HERNIA REPAIR     . TEE WITHOUT CARDIOVERSION N/A 03/09/2021   Procedure: TRANSESOPHAGEAL ECHOCARDIOGRAM (TEE) WITH PROPOFOL;  Surgeon: Arnoldo Lenis, MD;  [facility];  Service: Endoscopy;  Laterality: N/A;  . TONSILLECTOMY      Social History   Socioeconomic History  . Marital status: Single    Spouse name: Not on file  . Number of children: Not on file  . Years of education: Not on file  . Highest education level: Not on file  Occupational History  . Not on file  Tobacco Use  . Smoking status: Never Smoker  . Smokeless tobacco: Never Used  Vaping Use  . Vaping Use: Former  Substance and Sexual Activity  . Alcohol use: No  . Drug use: No  . Sexual activity: Yes  Other Topics Concern  . Not on file  Social History Narrative  . Not on file   Social Determinants of Health   Financial Resource Strain: Not on file  Food Insecurity: Not on file  Transportation Needs: Not on file  Physical Activity: Not on file  Stress: Not on file  Social Connections: Not on file  Family History  Problem Relation Age of Onset  . Hypertension Mother   . Diabetes Mother   . Heart disease Mother   . Crohn's disease Father   . Alcohol abuse Father   . Drug abuse Father   . Mental illness Father   . Cancer Father   . Hypertension Maternal Aunt   . Seizures Maternal Aunt   . Depression Maternal Aunt   . Asthma Maternal Uncle   . Hypertension Maternal Uncle   . Cancer Maternal Grandmother   . Diabetes Maternal Grandmother   . Mental illness Maternal Grandfather     Outpatient Encounter Medications as of 03/28/2021  Medication Sig  . albuterol (VENTOLIN HFA) 108 (90 Base) MCG/ACT inhaler Inhale 2 puffs into the lungs every 6 (six) hours as needed for wheezing or shortness of breath. For shortness of breath  . ceFAZolin (ANCEF) IVPB Inject 2 g into the vein every 8 (eight) hours. Indication:  Bacteremia, possible discitis First Dose: No Last Day of Therapy:  04/17/21 Labs - Once  weekly:  CBC/D and BMP, Labs - Every other week:  ESR and CRP Method of administration: IV Push Method of administration may be changed at the discretion of home infusion pharmacist based upon assessment of the patient and/or caregiver's ability to self-administer the medication ordered.  . citalopram (CELEXA) 10 MG tablet Take 10 mg by mouth daily.  . diphenhydrAMINE (BENADRYL) 12.5 MG/5ML elixir Take 10 mLs (25 mg total) by mouth every 6 (six) hours as needed for itching, allergies or sleep.  Marland Kitchen gabapentin (NEURONTIN) 100 MG capsule Take 1 capsule (100 mg total) by mouth 2 (two) times daily.  . hydrALAZINE (APRESOLINE) 25 MG tablet Take 1 tablet (25 mg total) by mouth 2 (two) times daily.  . insulin glargine (LANTUS) 100 UNIT/ML injection Inject 40 Units into the skin at bedtime.  . Insulin Glulisine (APIDRA [place]) Inject 10-16 Units into the skin 3 (three) times daily before meals. ssi  . isosorbide mononitrate (IMDUR) 30 MG 24 hr tablet Take 0.5 tablets (15 mg total) by mouth daily.  . metoCLOPramide (REGLAN) 5 MG tablet Take 1 tablet (5 mg total) by mouth every 8 (eight) hours as needed for refractory nausea / vomiting (refractory symptoms).  . metoprolol tartrate (LOPRESSOR) 50 MG tablet Take 1 tablet (50 mg total) by mouth 2 (two) times daily.  Marland Kitchen omeprazole (PRILOSEC) 40 MG capsule Take 1 capsule (40 mg total) by mouth in the morning and at bedtime.  . ondansetron (ZOFRAN ODT) 8 MG disintegrating tablet Take 1 tablet (8 mg total) by mouth every 8 (eight) hours as needed for nausea or vomiting.  . traZODone (DESYREL) 50 MG tablet Take 1 tablet (50 mg total) by mouth at bedtime.   No facility-administered encounter medications on file as of 03/28/2021.    ALLERGIES: Allergies  Allergen Reactions  . Amoxicillin Hives    Did it involve swelling of the face/tongue/throat, SOB, or low BP? no  Did it involve sudden or severe rash/hives, skin peeling, or any reaction on the inside of your mouth or  nose? no Did you need to seek medical attention at a hospital or doctor's office? no When did it last happen?unk If all above answers are "NO", may proceed with cephalosporin use.  Marland Kitchen Dexamethasone Hives and Itching  . Doxycycline Itching and Swelling  . Penicillins Hives and Itching    Did it involve swelling of the face/tongue/throat, SOB, or low BP? no  Did it involve sudden or severe  rash/hives, skin peeling, or any reaction on the inside of your mouth or nose? no Did you need to seek medical attention at a hospital or doctor's office? no When did it last happen?unk If all above answers are "NO", may proceed with cephalosporin use. Did it involve swelling of the face/tongue/throat, SOB, or low BP? no  Did it involve sudden or severe rash/hives, skin peeling, or any reac  . Ceftriaxone Sodium In Dextrose Itching  . Augmentin [Amoxicillin-Pot Clavulanate] Other (See Comments)    Reaction unknown  . Clavulanic Acid   . Adhesive [Tape] Itching and Rash  . Latex Itching and Rash  . Other Itching and Rash  . Pineapple Rash    Rash on tongue and throat    VACCINATION STATUS: Immunization History  Administered Date(s) Administered  . HPV Quadrivalent 08/27/2012  . Hepatitis A 08/27/2012  . Influenza Split 08/27/2012  . Meningococcal Conjugate 08/27/2012  . Moderna Sars-Covid-2 Vaccination 03/02/2020, 03/29/2020  . Pneumococcal Polysaccharide-23 01/26/2021    Diabetes She presents for her follow-up diabetic visit. She has type 1 diabetes mellitus. Onset time: She was diagnosed at approximate age of 78 years. Her disease course has been worsening. Pertinent negatives for hypoglycemia include no confusion, headaches, pallor or seizures. Associated symptoms include blurred vision, fatigue, foot paresthesias, foot ulcerations, polydipsia and polyuria. Pertinent negatives for diabetes include no chest pain and no polyphagia. There are no hypoglycemic complications. Symptoms are  improving. Diabetic complications include peripheral neuropathy. (Lack of adequate health insurance.  Noncompliance/nonengagement.) Risk factors for coronary artery disease include diabetes mellitus, family history, sedentary lifestyle and tobacco exposure. Current diabetic treatment includes insulin injections. She is compliant with treatment some of the time. Her weight is fluctuating minimally. She is following a generally unhealthy diet. When asked about meal planning, she reported none. She has not had a previous visit with a dietitian. She never participates in exercise. Her home blood glucose trend is decreasing steadily. Her overall blood glucose range is >200 mg/dl. (She is accompanied by her aide from integrative medicine.  She did not bring her recent glycemic profile and insulin administration records.  Her chart review shows ER visit twice since last visit due to hyperglycemic crisis.  Her previsit labs show A1c of 10.7%, overall improving from greater than 15.5%.  No recent hypoglycemia.  She reports intermittent nausea and vomiting.  In her new meter average blood glucose is 201 for the last 7 days.  She has not vomited today.  She tolerates mostly liquid/soft diet.   ) An ACE inhibitor/angiotensin II receptor blocker is being taken.     Review of Systems  Constitutional: Positive for fatigue. Negative for chills, fever and unexpected weight change.  HENT: Negative for trouble swallowing and voice change.   Eyes: Positive for blurred vision. Negative for visual disturbance.  Respiratory: Negative for cough, shortness of breath and wheezing.   Cardiovascular: Negative for chest pain, palpitations and leg swelling.  Gastrointestinal: Positive for nausea and vomiting. Negative for diarrhea.  Endocrine: Positive for polydipsia and polyuria. Negative for cold intolerance, heat intolerance and polyphagia.  Musculoskeletal: Negative for arthralgias and myalgias.  Skin: Negative for color  change, pallor, rash and wound.  Neurological: Negative for seizures and headaches.  Psychiatric/Behavioral: Negative for confusion and suicidal ideas.    Objective:    Vitals with BMI 03/28/2021 03/26/2021 03/26/2021  Height 4' 11"  - 4' 11"   Weight 125 lbs - 134 lbs  BMI 17.40 - 81.44  Systolic 818 563 -  Diastolic  86 78 -  Pulse 96 87 -    BP 140/86 (BP Location: Right Arm, Patient Position: Sitting) Comment: 112/82 R arm standing  Pulse 96   Ht 4' 11"  (1.499 m)   Wt 125 lb (56.7 kg)   BMI 25.25 kg/m   Wt Readings from Last 3 Encounters:  03/28/21 125 lb (56.7 kg)  03/26/21 134 lb (60.8 kg)  03/23/21 134 lb (60.8 kg)     Physical Exam Constitutional:      Appearance: She is well-developed.  HENT:     Head: Normocephalic and atraumatic.  Neck:     Thyroid: No thyromegaly.     Trachea: No tracheal deviation.  Cardiovascular:     Comments: Distant heart sounds. Pulmonary:     Effort: Pulmonary effort is normal.  Abdominal:     Tenderness: There is no abdominal tenderness. There is no guarding.  Musculoskeletal:        General: No swelling. Normal range of motion.     Cervical back: Normal range of motion and neck supple.     Right lower leg: No edema.     Left lower leg: No edema.  Skin:    General: Skin is warm and dry.     Coloration: Skin is not pale.     Findings: No erythema or rash.  Neurological:     Mental Status: She is alert and oriented to person, place, and time.     Cranial Nerves: No cranial nerve deficit.     Coordination: Coordination normal.     Deep Tendon Reflexes: Reflexes are normal and symmetric.  Psychiatric:     Comments: Patient is very reluctant.  She displays unconcerned affect.     CMP ( most recent) CMP     Component Value Date/Time   NA 139 03/19/2021 0548   K 3.5 03/19/2021 0548   CL 104 03/19/2021 0548   CO2 27 03/19/2021 0548   GLUCOSE 83 03/19/2021 0548   BUN 7 03/19/2021 0548   CREATININE 0.96 03/19/2021 0548    CREATININE 0.69 07/08/2012 1203   CALCIUM 8.1 (L) 03/19/2021 0548   PROT 7.3 03/16/2021 1631   ALBUMIN 2.2 (L) 03/16/2021 1631   AST 16 03/16/2021 1631   ALT 6 03/16/2021 1631   ALKPHOS 167 (H) 03/16/2021 1631   BILITOT 0.6 03/16/2021 1631   GFRNONAA >60 03/19/2021 0548   GFRAA >60 08/26/2020 0507    Diabetic Labs (most recent): Lab Results  Component Value Date   HGBA1C 10.4 (H) 03/16/2021   HGBA1C 5.9 (H) 03/02/2021   HGBA1C 14.3 (H) 01/30/2021    Lab Results  Component Value Date   TSH 2.228 01/30/2021   TSH 1.078 08/25/2020   TSH 1.181 07/08/2012      Assessment & Plan:   1. Uncontrolled type 1 diabetes mellitus with hyperglycemia (HCC) - Diamond [name] has currently uncontrolled symptomatic type 1 DM since  25 years of age. Her work-up in the hospital was negative for CHF, or ascites.  She has hypoalbuminemia.  She is accompanied by her aide from integrative medicine.  She did not bring her recent glycemic profile and insulin administration records.  Her chart review shows ER visit twice since last visit due to hyperglycemic crisis.  Her previsit labs show A1c of 10.7%, overall improving from greater than 15.5%.  No recent hypoglycemia.  She reports intermittent nausea and vomiting.  In her new meter average blood glucose is 201 for the last 7 days.  She reports  that she feels fine now and does not want to go to ER.  She is advised to go to ER if she starts to vomit or cannot keep her food and drinks down.  - Recent labs reviewed. - I had a long discussion with her and her aide about the progressive nature of diabetes and the pathology behind its complications. -her diabetes is complicated by chronic nonadherence/noncompliance, peripheral neuropathy and she remains at exceedingly  high risk for more acute and chronic complications which include CAD, CVA, CKD, retinopathy, and neuropathy. These are all discussed in detail with her.  - I have counseled her on diet  management  by adopting a carbohydrate restricted/protein rich diet. Patient is encouraged to switch to  unprocessed or minimally processed     complex starch and increased protein intake (animal or plant source), fruits, and vegetables.  -  she is advised to stick to a routine mealtimes to eat 3 meals  a day and avoid unnecessary snacks ( to snack only to correct hypoglycemia).   - she acknowledges that there is a room for improvement in her food and drink choices. - Suggestion is made for her to avoid simple carbohydrates  from her diet including Cakes, Sweet Desserts, Ice Cream, Soda (diet and regular), Sweet Tea, Candies, Chips, Cookies, Store Bought Juices, Alcohol in Excess of  1-2 drinks a day, Artificial Sweeteners,  Coffee Creamer, and "Sugar-free" Products, Lemonade. This will help patient to have more stable blood glucose profile and potentially avoid unintended weight gain.    - she has been scheduled with Jearld Fenton, RDN, CDE for diabetes education.  She did not keep her follow-up.  - I have reapproached her with the following individualized plan to manage  her diabetes and patient agrees:   - she has an aide from integrative medicine who promises to offer her help.   -Her presentation without any documentation of monitoring and insulin administration makes it difficult to optimize her insulin treatment.  She is alarmingly noncompliant/nonadherent at risk of hypo and hyperglycemic complications.    -She is advised to lower her Lantus to 40 units nightly, adjust her Apidra to 10  units 3 times a day with meals  for pre-meal BG readings of 90-125m/dl, plus patient specific correction dose for unexpected hyperglycemia above 1574mdl, associated with strict monitoring of glucose 4 times a day-before meals and at bedtime. - she is warned not to take insulin without proper monitoring per orders. - Adjustment parameters are given to her for hypo and hyperglycemia in writing. - she is  encouraged to call clinic for blood glucose levels less than 70 or above 300 mg /dl.  - she is not a candidate for metformin, SGLT2 inhibitors, nor incretin therapy.   - Specific targets for  A1c;  LDL, HDL,  and Triglycerides were discussed with the patient.  2) Blood Pressure /Hypertension: Her blood pressure is controlled to target.  She is advised to continue hydralazine 25 mg p.o. twice daily, metoprolol 50 mg p.o. twice daily.    3) Lipids/Hyperlipidemia: She does not have any lipid panel to review.     She will be considered for fasting lipid panel on subsequent visits.   4)  Weight/Diet:  Body mass index is 25.25 kg/m.  -She is not a candidate for weight loss.  Exercise, and detailed carbohydrates information provided  -  detailed on discharge instructions.  5) Chronic Care/Health Maintenance:  -she  is not  on ACEI/ARB and Statin medications and  is encouraged to initiate and continue to follow up with Ophthalmology, Dentist,  Podiatrist at least yearly or according to recommendations, and advised to   stay away from smoking. I have recommended yearly flu vaccine and pneumonia vaccine at least every 5 years;  and  sleep for at least 7 hours a day.  -She is advised to increase her Lasix to 40 mg p.o. every morning.  This patient is in desperate need for social worker to assist with her social life.  She has significant social constraints including lack of health  insurance which are interfering with her diabetes care.  - she is  advised to maintain close follow up with her PCP for primary care needs, as well as her other providers for optimal and coordinated care.    I spent 41 minutes in the care of the patient today including review of labs from [place], Lipids, Thyroid Function, Hematology (current and previous including abstractions from other facilities); face-to-face time discussing  her blood glucose readings/logs, discussing hypoglycemia and hyperglycemia episodes and symptoms,  medications doses, her options of short and long term treatment based on the latest standards of care / guidelines;  discussion about incorporating lifestyle medicine;  and documenting the encounter.    Please refer to Patient Instructions for Blood Glucose Monitoring and Insulin/Medications Dosing Guide"  in media tab for additional information. Please  also refer to " Patient Self Inventory" in the Media  tab for reviewed elements of pertinent patient history.  [name] participated in the discussions, expressed understanding, and voiced agreement with the above plans.  All questions were answered to her satisfaction. she is encouraged to contact clinic should she have any questions or concerns prior to her return visit.    Follow up plan: - Return in about 1 week (around 04/04/2021) for F/U with Meter and Logs Only - no Labs.  Glade Lloyd, MD [facility] [address] [place], [place] [address] Phone: [phone]  Fax: [phone]    03/28/2021, 3:47 PM  This note was partially dictated with voice recognition software. Similar sounding words can be transcribed inadequately or may not  be corrected upon review.

## 2021-03-28 NOTE — Patient Instructions (Signed)
                                     Advice for Weight Management  -For most of us the best way to lose weight is by diet management. Generally speaking, diet management means consuming less calories intentionally which over time brings about progressive weight loss.  This can be achieved more effectively by restricting carbohydrate consumption to the minimum possible.  So, it is critically important to know your numbers: how much calorie you are consuming and how much calorie you need. More importantly, our carbohydrates sources should be unprocessed or minimally processed complex starch food items.   Sometimes, it is important to balance nutrition by increasing protein intake (animal or plant source), fruits, and vegetables.  -Sticking to a routine mealtime to eat 3 meals a day and avoiding unnecessary snacks is shown to have a big role in weight control. Under normal circumstances, the only time we lose real weight is when we are hungry, so allow hunger to take place- hunger means no food between meal times, only water.  It is not advisable to starve.   -It is better to avoid simple carbohydrates including: Cakes, Sweet Desserts, Ice Cream, Soda (diet and regular), Sweet Tea, Candies, Chips, Cookies, Store Bought Juices, Alcohol in Excess of  1-2 drinks a day, Lemonade,  Artificial Sweeteners, Doughnuts, Coffee Creamers, "Sugar-free" Products, etc, etc.  This is not a complete list.....    -Consulting with certified diabetes educators is proven to provide you with the most accurate and current information on diet.  Also, you may be  interested in discussing diet options/exchanges , we can schedule a visit with Diamond [name], RDN, CDE for individualized nutrition education.  -Exercise: If you are able: 30 -60 minutes a day ,4 days a week, or 150 minutes a week.  The longer the better.  Combine stretch, strength, and aerobic activities.  If you were told in the  past that you have high risk for cardiovascular diseases, you may seek evaluation by your heart doctor prior to initiating moderate to intense exercise programs.                                  Additional Care Considerations for Diabetes   -Diabetes  is a chronic disease.  The most important care consideration is regular follow-up with your diabetes care provider with the goal being avoiding or delaying its complications and to take advantage of advances in medications and technology.    -Type 2 diabetes is known to coexist with other important comorbidities such as high blood pressure and high cholesterol.  It is critical to control not only the diabetes but also the high blood pressure and high cholesterol to minimize and delay the risk of complications including coronary artery disease, stroke, amputations, blindness, etc.    - Studies showed that people with diabetes will benefit from a class of medications known as ACE inhibitors and statins.  Unless there are specific reasons not to be on these medications, the standard of care is to consider getting one from these groups of medications at an optimal doses.  These medications are generally considered safe and proven to help protect the heart and the kidneys.    - People with diabetes are encouraged to initiate and maintain regular follow-up with eye doctors, foot   doctors, dentists , and if necessary heart and kidney doctors.     - It is highly recommended that people with diabetes quit smoking or stay away from smoking, and get yearly  flu vaccine and pneumonia vaccine at least every 5 years.  One other important lifestyle recommendation is to ensure adequate sleep - at least 6-7 hours of uninterrupted sleep at night.  -Exercise: If you are able: 30 -60 minutes a day, 4 days a week, or 150 minutes a week.  The longer the better.  Combine stretch, strength, and aerobic activities.  If you were told in the past that you have high risk for  cardiovascular diseases, you may seek evaluation by your heart doctor prior to initiating moderate to intense exercise programs.          

## 2021-03-28 NOTE — Telephone Encounter (Signed)
Pt and her community paramedic stopped by the office reporting that Diamond [name] is having emesis and was requesting rx for anti-emetic medication.   They also reported that Diamond [name] is orthostatic.  Discussed with pt and her paramedic that in light of [name]'s conditions, she should get checked in the ER (so she can have labs checked, get anti-emetic and whatever else is felt to be necessary.  Discussed that it wouldn't be safe for her to just get anti-emetic meds in light of being orthostatic.  Pt states understanding.

## 2021-03-29 ENCOUNTER — Telehealth: Payer: Self-pay

## 2021-03-29 NOTE — Telephone Encounter (Signed)
Per chart, patient was admitted to the hospital earlier this month for intractable nausea and vomiting with hyperglycemia. RN called patient, she states this episode of nausea is worse than last time. She reports no relief with her antiemetic medications. She states she took her blood sugar at lunch time today and it was "100 and something." RN advised patient that she should go to the emergency room if she feels this badly and is unable to keep anything down. Patient states she is tired of the hospital. RN emphasized importance of being evaluated in the emergency department. Patient verbalized understanding and has no further questions.   Beryle Flock, RN

## 2021-03-29 NOTE — Telephone Encounter (Signed)
Received call from patient, she states she has been having headaches and throwing up since starting her cefazolin and two days ago she began having diarrhea. She states she is allergic to the antibiotic and that she takes benadryl 30 minutes prior to her infusion. Patient denies shortness of breath, rash, itching, or difficulty breathing. She also states that her back pain is persistent and her "throat area looks swollen." She last had her infusion at 7am this morning. Will route to provider.   Beryle Flock, RN

## 2021-03-30 ENCOUNTER — Other Ambulatory Visit: Payer: Self-pay

## 2021-03-30 ENCOUNTER — Emergency Department (HOSPITAL_COMMUNITY)
Admission: EM | Admit: 2021-03-30 | Discharge: 2021-03-31 | Disposition: A | Discharge status: Home / Self Care | Payer: Self-pay | Attending: Emergency Medicine | Admitting: Emergency Medicine

## 2021-03-30 ENCOUNTER — Telehealth: Payer: Self-pay

## 2021-03-30 DIAGNOSIS — E1040 Type 1 diabetes mellitus with diabetic neuropathy, unspecified: Secondary | ICD-10-CM | POA: Insufficient documentation

## 2021-03-30 DIAGNOSIS — I10 Essential (primary) hypertension: Secondary | ICD-10-CM | POA: Insufficient documentation

## 2021-03-30 DIAGNOSIS — R1084 Generalized abdominal pain: Secondary | ICD-10-CM | POA: Insufficient documentation

## 2021-03-30 DIAGNOSIS — R42 Dizziness and giddiness: Secondary | ICD-10-CM | POA: Insufficient documentation

## 2021-03-30 DIAGNOSIS — R519 Headache, unspecified: Secondary | ICD-10-CM | POA: Insufficient documentation

## 2021-03-30 DIAGNOSIS — M791 Myalgia, unspecified site: Secondary | ICD-10-CM | POA: Insufficient documentation

## 2021-03-30 DIAGNOSIS — R112 Nausea with vomiting, unspecified: Secondary | ICD-10-CM | POA: Insufficient documentation

## 2021-03-30 DIAGNOSIS — R109 Unspecified abdominal pain: Secondary | ICD-10-CM | POA: Insufficient documentation

## 2021-03-30 DIAGNOSIS — Z91040 Latex allergy status: Secondary | ICD-10-CM | POA: Insufficient documentation

## 2021-03-30 DIAGNOSIS — Z20822 Contact with and (suspected) exposure to covid-19: Secondary | ICD-10-CM | POA: Insufficient documentation

## 2021-03-30 DIAGNOSIS — Z794 Long term (current) use of insulin: Secondary | ICD-10-CM | POA: Insufficient documentation

## 2021-03-30 DIAGNOSIS — R6883 Chills (without fever): Secondary | ICD-10-CM | POA: Insufficient documentation

## 2021-03-30 DIAGNOSIS — J45909 Unspecified asthma, uncomplicated: Secondary | ICD-10-CM | POA: Insufficient documentation

## 2021-03-30 DIAGNOSIS — K219 Gastro-esophageal reflux disease without esophagitis: Secondary | ICD-10-CM | POA: Insufficient documentation

## 2021-03-30 DIAGNOSIS — Z8639 Personal history of other endocrine, nutritional and metabolic disease: Secondary | ICD-10-CM

## 2021-03-30 DIAGNOSIS — Z79899 Other long term (current) drug therapy: Secondary | ICD-10-CM | POA: Insufficient documentation

## 2021-03-30 DIAGNOSIS — R197 Diarrhea, unspecified: Secondary | ICD-10-CM | POA: Insufficient documentation

## 2021-03-30 DIAGNOSIS — R202 Paresthesia of skin: Secondary | ICD-10-CM | POA: Insufficient documentation

## 2021-03-30 LAB — CBC WITH DIFFERENTIAL/PLATELET
Abs Immature Granulocytes: 0.03 10*3/uL (ref 0.00–0.07)
Basophils Absolute: 0.1 10*3/uL (ref 0.0–0.1)
Basophils Relative: 1 %
Eosinophils Absolute: 0.4 10*3/uL (ref 0.0–0.5)
Eosinophils Relative: 6 %
HCT: 34.1 % — ABNORMAL LOW (ref 36.0–46.0)
Hemoglobin: 11.3 g/dL — ABNORMAL LOW (ref 12.0–15.0)
Immature Granulocytes: 0 %
Lymphocytes Relative: 26 %
Lymphs Abs: 1.8 10*3/uL (ref 0.7–4.0)
MCH: 29.5 pg (ref 26.0–34.0)
MCHC: 33.1 g/dL (ref 30.0–36.0)
MCV: 89 fL (ref 80.0–100.0)
Monocytes Absolute: 0.4 10*3/uL (ref 0.1–1.0)
Monocytes Relative: 7 %
Neutro Abs: 4.1 10*3/uL (ref 1.7–7.7)
Neutrophils Relative %: 60 %
Platelets: 293 10*3/uL (ref 150–400)
RBC: 3.83 MIL/uL — ABNORMAL LOW (ref 3.87–5.11)
RDW: 13.2 % (ref 11.5–15.5)
WBC: 6.8 10*3/uL (ref 4.0–10.5)
nRBC: 0 % (ref 0.0–0.2)

## 2021-03-30 LAB — COMPREHENSIVE METABOLIC PANEL
ALT: 8 U/L (ref 0–44)
AST: 12 U/L — ABNORMAL LOW (ref 15–41)
Albumin: 2.1 g/dL — ABNORMAL LOW (ref 3.5–5.0)
Alkaline Phosphatase: 112 U/L (ref 38–126)
Anion gap: 8 (ref 5–15)
BUN: 11 mg/dL (ref 6–20)
CO2: 29 mmol/L (ref 22–32)
Calcium: 8.6 mg/dL — ABNORMAL LOW (ref 8.9–10.3)
Chloride: 101 mmol/L (ref 98–111)
Creatinine, Ser: 0.89 mg/dL (ref 0.44–1.00)
GFR, Estimated: >60 mL/min (ref >60)
Glucose, Bld: 156 mg/dL — ABNORMAL HIGH (ref 70–99)
Potassium: 3.4 mmol/L — ABNORMAL LOW (ref 3.5–5.1)
Sodium: 138 mmol/L (ref 135–145)
Total Bilirubin: 0.6 mg/dL (ref 0.3–1.2)
Total Protein: 6.7 g/dL (ref 6.5–8.1)

## 2021-03-30 MED ORDER — SODIUM CHLORIDE 0.9 % IV BOLUS
1000.0000 mL | Freq: Once | INTRAVENOUS | Status: AC
  Administered 2021-03-31: 1000 mL via INTRAVENOUS

## 2021-03-30 MED ORDER — KETOROLAC TROMETHAMINE 30 MG/ML IJ SOLN
30.0000 mg | Freq: Once | INTRAMUSCULAR | Status: AC
  Administered 2021-03-31: 30 mg via INTRAVENOUS
  Filled 2021-03-30: qty 1

## 2021-03-30 MED ORDER — ONDANSETRON HCL 4 MG/2ML IJ SOLN
4.0000 mg | Freq: Once | INTRAMUSCULAR | Status: AC
  Administered 2021-03-31: 4 mg via INTRAVENOUS
  Filled 2021-03-30: qty 2

## 2021-03-30 MED ORDER — SODIUM CHLORIDE 0.9 % IV BOLUS
1000.0000 mL | Freq: Once | INTRAVENOUS | Status: AC
  Administered 2021-03-30: 1000 mL via INTRAVENOUS

## 2021-03-30 NOTE — Telephone Encounter (Signed)
[place] that Dr.Nida filled out pt's order form and had given it to pt's caregiver to be sent to the pharmacy. Understanding voiced.

## 2021-03-30 NOTE — Telephone Encounter (Signed)
Diamond [name] is calling to check the status of the form they faxed over about the dexcom. Call back is [phone]

## 2021-03-30 NOTE — ED Provider Notes (Signed)
[facility] EMERGENCY DEPARTMENT Provider Note   CSN: [id number] Arrival date & time: 03/30/21  2154     History Chief Complaint  Patient presents with  . Nausea  . Vomiting  . Diarrhea    C/o N/V/D, tingling, and headache  . Dizziness    Diamond [name] is a 25 y.o. female.  Patient is a 25 year old female with past medical history of type 1 diabetes, bipolar, GERD, hypertension.  Patient also with recent diagnosis of a "infection in her spine" and is receiving IV antibiotics at home through a PICC line.  She tells me she has a MRSA infection.  She presents today with complaints of nausea, vomiting, and diarrhea that has worsened over the past several days.  She describes body aches and chills.  She denies any bloody stool or vomit.  She denies any ill contacts or having consumed any suspicious foods.  The history is provided by the patient.  Diarrhea Quality:  Watery Severity:  Moderate Onset quality:  Sudden Duration:  3 days Timing:  Constant Progression:  Worsening Relieved by:  Nothing Worsened by:  Nothing Ineffective treatments:  None tried Associated symptoms: abdominal pain and chills   Associated symptoms: no fever   Dizziness Associated symptoms: diarrhea        Past Medical History:  Diagnosis Date  . ADHD (attention deficit hyperactivity disorder) 2007  . Allergy   . Asthma   . Bipolar disorder ([place])   . Crohn's colitis ([place])    Dx Dr. Cherrie Gauze  . Depression    early childhood, trial of seroquel?  . Diabetic neuropathy ([place])   . Edema   . GERD (gastroesophageal reflux disease)   . Hypertension   . Pancreatitis   . Panic attacks   . PTSD (post-traumatic stress disorder)   . Stroke ([place]) 07/2020   R side  . Type 1 diabetes ([place]) 2008    Patient Active Problem List   Diagnosis Date Noted  . Low back pain without sciatica   . Intractable nausea and vomiting 03/16/2021  . Nausea and vomiting   . DM1 with Diabetic nephropathy  with proteinuria and Nephrotic Syndrome- 03/10/2021    Class: Chronic  . Multifocal pneumonia 03/05/2021  . Bladder outlet obstruction   . Constipation   . Abdominal pain, epigastric   . Abnormal computed tomography of cecum and terminal ileum   . Hyperosmolar hyperglycemic state (HHS) ([place]) 01/30/2021  . Crohn's disease ([place]) 12/08/2020  . Gastroparesis 12/08/2020  . Essential hypertension, benign 11/01/2020  . Edema 10/19/2020  . Anasarca 10/18/2020  . Hypoalbuminemia-due to proteinuria/nephrotic syndrome 10/18/2020  . Personal history of noncompliance with medical treatment, presenting hazards to health 10/12/2020  . Bilateral lower extremity edema 08/25/2020  . Pneumonia 08/25/2020  . Eczema 10/23/2012  . Abdominal pain 08/27/2012  . GERD (gastroesophageal reflux disease) 07/25/2012  . Mood disorder ([place]) 07/09/2012  . Contraception management 07/09/2012  . History of Crohn's disease 07/09/2012  . Uncontrolled type 1 diabetes mellitus with hyperglycemia and Diabetic Nephropathy 07/08/2012    Class: Chronic  . Asthma, persistent controlled 07/08/2012    Past Surgical History:  Procedure Laterality Date  . ADENOIDECTOMY    . BIOPSY  02/01/2021   Procedure: BIOPSY;  Surgeon: Rogene Houston, MD;  Location: [facility];  Service: Endoscopy;;  duodenal, esophageal,  . BIOPSY  02/03/2021   Procedure: BIOPSY;  Surgeon: Eloise Harman, DO;  Location: [facility];  Service: Endoscopy;;  . COLONOSCOPY WITH PROPOFOL  N/A 02/01/2021   Procedure: COLONOSCOPY WITH PROPOFOL;  Surgeon: Rogene Houston, MD;  Location: [facility];  Service: Endoscopy;  Laterality: N/A;  . COLONOSCOPY WITH PROPOFOL N/A 02/03/2021   Procedure: COLONOSCOPY WITH PROPOFOL;  Surgeon: Eloise Harman, DO;  Location: [facility];  Service: Endoscopy;  Laterality: N/A;  . ESOPHAGOGASTRODUODENOSCOPY (EGD) WITH PROPOFOL N/A 02/01/2021   Procedure: ESOPHAGOGASTRODUODENOSCOPY (EGD) WITH PROPOFOL;  Surgeon: Rogene Houston, MD;  Location: [facility];  Service: Endoscopy;  Laterality: N/A;  . HERNIA REPAIR    . TEE WITHOUT CARDIOVERSION N/A 03/09/2021   Procedure: TRANSESOPHAGEAL ECHOCARDIOGRAM (TEE) WITH PROPOFOL;  Surgeon: Arnoldo Lenis, MD;  Location: [facility];  Service: Endoscopy;  Laterality: N/A;  . TONSILLECTOMY       OB History   No obstetric history on file.     Family History  Problem Relation Age of Onset  . Hypertension Mother   . Diabetes Mother   . Heart disease Mother   . Crohn's disease Father   . Alcohol abuse Father   . Drug abuse Father   . Mental illness Father   . Cancer Father   . Hypertension Maternal Aunt   . Seizures Maternal Aunt   . Depression Maternal Aunt   . Asthma Maternal Uncle   . Hypertension Maternal Uncle   . Cancer Maternal Grandmother   . Diabetes Maternal Grandmother   . Mental illness Maternal Grandfather     Social History   Tobacco Use  . Smoking status: Never Smoker  . Smokeless tobacco: Never Used  Vaping Use  . Vaping Use: Former  Substance Use Topics  . Alcohol use: No  . Drug use: No    Home Medications Prior to Admission medications   Medication Sig Start Date End Date Taking? Authorizing Provider  albuterol (VENTOLIN HFA) 108 (90 Base) MCG/ACT inhaler Inhale 2 puffs into the lungs every 6 (six) hours as needed for wheezing or shortness of breath. For shortness of breath 03/10/21   Roxan Hockey, MD  ceFAZolin (ANCEF) IVPB Inject 2 g into the vein every 8 (eight) hours. Indication:  Bacteremia, possible discitis First Dose: No Last Day of Therapy:  04/17/21 Labs - Once weekly:  CBC/D and BMP, Labs - Every other week:  ESR and CRP Method of administration: IV Push Method of administration may be changed at the discretion of home infusion pharmacist based upon assessment of the patient and/or caregiver's ability to self-administer the medication ordered. 03/10/21 04/18/21  Roxan Hockey, MD  citalopram (CELEXA) 10 MG  tablet Take 10 mg by mouth daily.    [provider]  diphenhydrAMINE (BENADRYL) 12.5 MG/5ML elixir Take 10 mLs (25 mg total) by mouth every 6 (six) hours as needed for itching, allergies or sleep. 03/10/21   Roxan Hockey, MD  gabapentin (NEURONTIN) 100 MG capsule Take 1 capsule (100 mg total) by mouth 2 (two) times daily. 03/10/21   Roxan Hockey, MD  hydrALAZINE (APRESOLINE) 25 MG tablet Take 1 tablet (25 mg total) by mouth 2 (two) times daily. 03/10/21   Roxan Hockey, MD  insulin glargine (LANTUS) 100 UNIT/ML injection Inject 40 Units into the skin at bedtime.    [provider]  Insulin Glulisine (APIDRA [place]) Inject 10-16 Units into the skin 3 (three) times daily before meals. ssi    [provider]  isosorbide mononitrate (IMDUR) 30 MG 24 hr tablet Take 0.5 tablets (15 mg total) by mouth daily. 03/19/21   Barton Dubois, MD  metoCLOPramide (REGLAN) 5 MG tablet Take 1 tablet (5 mg total) by mouth every 8 (eight) hours as needed for refractory nausea / vomiting (refractory symptoms). 03/19/21 03/19/22  Barton Dubois, MD  metoprolol tartrate (LOPRESSOR) 50 MG tablet Take 1 tablet (50 mg total) by mouth 2 (two) times daily. 03/23/21   [name], PA-C  omeprazole (PRILOSEC) 40 MG capsule Take 1 capsule (40 mg total) by mouth in the morning and at bedtime. 03/23/21   [name], PA-C  ondansetron (ZOFRAN ODT) 8 MG disintegrating tablet Take 1 tablet (8 mg total) by mouth every 8 (eight) hours as needed for nausea or vomiting. 03/10/21   Roxan Hockey, MD  traZODone (DESYREL) 50 MG tablet Take 1 tablet (50 mg total) by mouth at bedtime. 03/10/21   Roxan Hockey, MD    Allergies    Amoxicillin, Dexamethasone, Doxycycline, Penicillins, Ceftriaxone sodium in dextrose, Augmentin [amoxicillin-pot clavulanate], Clavulanic acid, Adhesive [tape], Latex, Other, and Pineapple  Review of Systems   Review of Systems  Constitutional: Positive for chills. Negative for  fever.  Gastrointestinal: Positive for abdominal pain and diarrhea.  Neurological: Positive for dizziness.  All other systems reviewed and are negative.   Physical Exam Updated Vital Signs BP (!) 158/106 (BP Location: Left Arm)   Pulse (!) 105   Temp 98.6 F (37 C) (Oral)   Resp 17   Ht 4' 11"  (1.499 m)   SpO2 100%   BMI 25.25 kg/m   Physical Exam Vitals and nursing note reviewed.  Constitutional:      General: She is not in acute distress.    Appearance: She is well-developed. She is not diaphoretic.  HENT:     Head: Normocephalic and atraumatic.  Cardiovascular:     Rate and Rhythm: Normal rate and regular rhythm.     Heart sounds: No murmur heard. No friction rub. No gallop.   Pulmonary:     Effort: Pulmonary effort is normal. No respiratory distress.     Breath sounds: Normal breath sounds. No wheezing.  Abdominal:     General: Bowel sounds are normal. There is no distension.     Palpations: Abdomen is soft.     Tenderness: There is abdominal tenderness. There is no guarding or rebound.     Comments: There is mild generalized tenderness, but no rebound or guarding.  Musculoskeletal:        General: Normal range of motion.     Cervical back: Normal range of motion and neck supple.  Skin:    General: Skin is warm and dry.  Neurological:     Mental Status: She is alert and oriented to person, place, and time.     ED Results / Procedures / Treatments   Labs (all labs ordered are listed, but only abnormal results are displayed) Labs Reviewed  CBC WITH DIFFERENTIAL/PLATELET - Abnormal; Notable for the following components:      Result Value   RBC 3.83 (*)    Hemoglobin 11.3 (*)    HCT 34.1 (*)    All other components within normal limits  COMPREHENSIVE METABOLIC PANEL - Abnormal; Notable for the following components:   Potassium 3.4 (*)    Glucose, Bld 156 (*)    Calcium 8.6 (*)    Albumin 2.1 (*)    AST 12 (*)    All other components within normal limits     EKG None  Radiology No results found.  Procedures Procedures   Medications Ordered in ED Medications  sodium chloride  0.9 % bolus 1,000 mL (has no administration in time range)  ondansetron (ZOFRAN) injection 4 mg (has no administration in time range)  ketorolac (TORADOL) 30 MG/ML injection 30 mg (has no administration in time range)  sodium chloride 0.9 % bolus 1,000 mL (1,000 mLs Intravenous New Bag/Given 03/30/21 2311)    ED Course  I have reviewed the triage vital signs and the nursing notes.  Pertinent labs & imaging results that were available during my care of the patient were reviewed by me and considered in my medical decision making (see chart for details).    MDM Rules/Calculators/A&P  Patient presenting here with complaints of nausea and vomiting.  She has history of diabetes and gastroparesis.  Patient has been hydrated and given antiemetics.  She has been sleeping and I have not seen her vomit in several hours.  Patient's work-up otherwise unremarkable including laboratory studies.  She will be discharged with primary doctor follow-up.  Final Clinical Impression(s) / ED Diagnoses Final diagnoses:  None    Rx / DC Orders ED Discharge Orders    None       Veryl Speak, MD 03/31/21 [phone]

## 2021-03-30 NOTE — ED Notes (Signed)
Pt resting in room, c/o "feels like something is squeezing my heart", c/o N/V/D,headache, dizziness, weakness,and tingling, Dr. Roderic Palau made aware. EKG performed and given to EDP. No new orders at this time.

## 2021-03-30 NOTE — ED Triage Notes (Signed)
Pt BIB [facility] c/o N/V/D x 3 days with headache, weakness, and tingling. Hx stroke. PICC line to R am.

## 2021-03-31 ENCOUNTER — Observation Stay (HOSPITAL_COMMUNITY)
Admission: EM | Admit: 2021-03-31 | Discharge: 2021-04-02 | Disposition: A | Discharge status: Home / Self Care | Payer: Self-pay | Attending: Internal Medicine | Admitting: Internal Medicine

## 2021-03-31 ENCOUNTER — Encounter (HOSPITAL_COMMUNITY): Payer: Self-pay

## 2021-03-31 ENCOUNTER — Other Ambulatory Visit: Payer: Self-pay

## 2021-03-31 ENCOUNTER — Emergency Department (HOSPITAL_COMMUNITY): Payer: Self-pay

## 2021-03-31 DIAGNOSIS — N179 Acute kidney failure, unspecified: Secondary | ICD-10-CM | POA: Insufficient documentation

## 2021-03-31 DIAGNOSIS — Z79899 Other long term (current) drug therapy: Secondary | ICD-10-CM | POA: Insufficient documentation

## 2021-03-31 DIAGNOSIS — E1021 Type 1 diabetes mellitus with diabetic nephropathy: Secondary | ICD-10-CM | POA: Insufficient documentation

## 2021-03-31 DIAGNOSIS — K3184 Gastroparesis: Secondary | ICD-10-CM

## 2021-03-31 DIAGNOSIS — J45909 Unspecified asthma, uncomplicated: Secondary | ICD-10-CM | POA: Insufficient documentation

## 2021-03-31 DIAGNOSIS — Z794 Long term (current) use of insulin: Secondary | ICD-10-CM | POA: Insufficient documentation

## 2021-03-31 DIAGNOSIS — E1043 Type 1 diabetes mellitus with diabetic autonomic (poly)neuropathy: Principal | ICD-10-CM | POA: Insufficient documentation

## 2021-03-31 DIAGNOSIS — Z91040 Latex allergy status: Secondary | ICD-10-CM | POA: Insufficient documentation

## 2021-03-31 DIAGNOSIS — Z20822 Contact with and (suspected) exposure to covid-19: Secondary | ICD-10-CM | POA: Insufficient documentation

## 2021-03-31 DIAGNOSIS — Z8673 Personal history of transient ischemic attack (TIA), and cerebral infarction without residual deficits: Secondary | ICD-10-CM | POA: Insufficient documentation

## 2021-03-31 DIAGNOSIS — E1065 Type 1 diabetes mellitus with hyperglycemia: Secondary | ICD-10-CM | POA: Insufficient documentation

## 2021-03-31 DIAGNOSIS — E1143 Type 2 diabetes mellitus with diabetic autonomic (poly)neuropathy: Secondary | ICD-10-CM | POA: Diagnosis present

## 2021-03-31 DIAGNOSIS — N12 Tubulo-interstitial nephritis, not specified as acute or chronic: Secondary | ICD-10-CM | POA: Insufficient documentation

## 2021-03-31 DIAGNOSIS — I10 Essential (primary) hypertension: Secondary | ICD-10-CM | POA: Insufficient documentation

## 2021-03-31 LAB — URINALYSIS, ROUTINE W REFLEX MICROSCOPIC
Bacteria, UA: NONE SEEN
Bilirubin Urine: NEGATIVE
Bilirubin Urine: NEGATIVE
Glucose, UA: >=500 mg/dL — AB
Glucose, UA: >=500 mg/dL — AB
Hgb urine dipstick: NEGATIVE
Hgb urine dipstick: NEGATIVE
Ketones, ur: NEGATIVE mg/dL
Ketones, ur: NEGATIVE mg/dL
Nitrite: NEGATIVE
Nitrite: NEGATIVE
Protein, ur: >=300 mg/dL — AB
Protein, ur: >=300 mg/dL — AB
Specific Gravity, Urine: 1.007 (ref 1.005–1.030)
Specific Gravity, Urine: 1.011 (ref 1.005–1.030)
pH: 8 (ref 5.0–8.0)
pH: 7 (ref 5.0–8.0)

## 2021-03-31 LAB — COMPREHENSIVE METABOLIC PANEL
ALT: 8 U/L (ref 0–44)
AST: 10 U/L — ABNORMAL LOW (ref 15–41)
Albumin: 2 g/dL — ABNORMAL LOW (ref 3.5–5.0)
Alkaline Phosphatase: 107 U/L (ref 38–126)
Anion gap: 9 (ref 5–15)
BUN: 11 mg/dL (ref 6–20)
CO2: 24 mmol/L (ref 22–32)
Calcium: 7.9 mg/dL — ABNORMAL LOW (ref 8.9–10.3)
Chloride: 96 mmol/L — ABNORMAL LOW (ref 98–111)
Creatinine, Ser: 1.24 mg/dL — ABNORMAL HIGH (ref 0.44–1.00)
GFR, Estimated: >60 mL/min (ref >60)
Glucose, Bld: 372 mg/dL — ABNORMAL HIGH (ref 70–99)
Potassium: 3.5 mmol/L (ref 3.5–5.1)
Sodium: 129 mmol/L — ABNORMAL LOW (ref 135–145)
Total Bilirubin: 0.8 mg/dL (ref 0.3–1.2)
Total Protein: 6.3 g/dL — ABNORMAL LOW (ref 6.5–8.1)

## 2021-03-31 LAB — CBC WITH DIFFERENTIAL/PLATELET
Abs Immature Granulocytes: 0.03 10*3/uL (ref 0.00–0.07)
Basophils Absolute: 0 10*3/uL (ref 0.0–0.1)
Basophils Relative: 1 %
Eosinophils Absolute: 0.1 10*3/uL (ref 0.0–0.5)
Eosinophils Relative: 1 %
HCT: 33.3 % — ABNORMAL LOW (ref 36.0–46.0)
Hemoglobin: 10.8 g/dL — ABNORMAL LOW (ref 12.0–15.0)
Immature Granulocytes: 0 %
Lymphocytes Relative: 12 %
Lymphs Abs: 0.8 10*3/uL (ref 0.7–4.0)
MCH: 29.3 pg (ref 26.0–34.0)
MCHC: 32.4 g/dL (ref 30.0–36.0)
MCV: 90.5 fL (ref 80.0–100.0)
Monocytes Absolute: 0.3 10*3/uL (ref 0.1–1.0)
Monocytes Relative: 4 %
Neutro Abs: 6 10*3/uL (ref 1.7–7.7)
Neutrophils Relative %: 82 %
Platelets: 232 10*3/uL (ref 150–400)
RBC: 3.68 MIL/uL — ABNORMAL LOW (ref 3.87–5.11)
RDW: 13.2 % (ref 11.5–15.5)
WBC: 7.3 10*3/uL (ref 4.0–10.5)
nRBC: 0 % (ref 0.0–0.2)

## 2021-03-31 LAB — HCG, SERUM, QUALITATIVE: Preg, Serum: NEGATIVE

## 2021-03-31 LAB — LIPASE, BLOOD: Lipase: 20 U/L (ref 11–51)

## 2021-03-31 LAB — TROPONIN I (HIGH SENSITIVITY)
Troponin I (High Sensitivity): 10 ng/L (ref <18)
Troponin I (High Sensitivity): 11 ng/L (ref <18)

## 2021-03-31 LAB — RESP PANEL BY RT-PCR (FLU A&B, COVID) ARPGX2
Influenza A by PCR: NEGATIVE
Influenza B by PCR: NEGATIVE
SARS Coronavirus 2 by RT PCR: NEGATIVE

## 2021-03-31 LAB — CBG MONITORING, ED: Glucose-Capillary: 368 mg/dL — ABNORMAL HIGH (ref 70–99)

## 2021-03-31 MED ORDER — SODIUM CHLORIDE 0.9 % IV BOLUS
1000.0000 mL | Freq: Once | INTRAVENOUS | Status: AC
  Administered 2021-03-31: 1000 mL via INTRAVENOUS

## 2021-03-31 MED ORDER — CIPROFLOXACIN IN D5W 400 MG/200ML IV SOLN
400.0000 mg | Freq: Two times a day (BID) | INTRAVENOUS | Status: DC
  Administered 2021-03-31 – 2021-04-02 (×4): 400 mg via INTRAVENOUS
  Filled 2021-03-31 (×4): qty 200

## 2021-03-31 MED ORDER — HYDROMORPHONE HCL 1 MG/ML IJ SOLN
0.5000 mg | Freq: Once | INTRAMUSCULAR | Status: AC
  Administered 2021-03-31: 0.5 mg via INTRAVENOUS
  Filled 2021-03-31: qty 1

## 2021-03-31 MED ORDER — PANTOPRAZOLE SODIUM 40 MG IV SOLR
40.0000 mg | Freq: Once | INTRAVENOUS | Status: AC
  Administered 2021-03-31: 40 mg via INTRAVENOUS
  Filled 2021-03-31: qty 40

## 2021-03-31 MED ORDER — PROMETHAZINE HCL 25 MG/ML IJ SOLN
INTRAMUSCULAR | Status: AC
  Filled 2021-03-31: qty 1

## 2021-03-31 MED ORDER — METOCLOPRAMIDE HCL 5 MG/ML IJ SOLN
10.0000 mg | Freq: Three times a day (TID) | INTRAMUSCULAR | Status: DC
  Administered 2021-03-31 – 2021-04-02 (×4): 10 mg via INTRAVENOUS
  Filled 2021-03-31 (×4): qty 2

## 2021-03-31 MED ORDER — ONDANSETRON HCL 4 MG/2ML IJ SOLN
4.0000 mg | Freq: Once | INTRAMUSCULAR | Status: AC
  Administered 2021-03-31: 4 mg via INTRAVENOUS
  Filled 2021-03-31: qty 2

## 2021-03-31 MED ORDER — SODIUM CHLORIDE 0.9 % IV SOLN
12.5000 mg | Freq: Four times a day (QID) | INTRAVENOUS | Status: DC | PRN
  Administered 2021-03-31: 12.5 mg via INTRAVENOUS
  Filled 2021-03-31: qty 0.5

## 2021-03-31 MED ORDER — IOHEXOL 300 MG/ML  SOLN
100.0000 mL | Freq: Once | INTRAMUSCULAR | Status: AC | PRN
  Administered 2021-03-31: 75 mL via INTRAVENOUS

## 2021-03-31 MED ORDER — INSULIN ASPART 100 UNIT/ML IJ SOLN
8.0000 [IU] | Freq: Once | INTRAMUSCULAR | Status: AC
  Administered 2021-03-31: 8 [IU] via SUBCUTANEOUS
  Filled 2021-03-31: qty 1

## 2021-03-31 NOTE — ED Notes (Signed)
Pt to CT at this time.

## 2021-03-31 NOTE — ED Notes (Signed)
PT c/o headache. MD made aware

## 2021-03-31 NOTE — ED Triage Notes (Signed)
Pt presents to ED with complaints of nausea, vomiting, diarrhea, dizziness, and swelling. Was seen this am.

## 2021-03-31 NOTE — ED Notes (Signed)
Dr Roderic Palau at bedside prior to triage complete for [facility].

## 2021-03-31 NOTE — Discharge Instructions (Addendum)
Continue medications as previously prescribed.  Follow-up with primary doctor in the next few days.

## 2021-03-31 NOTE — ED Notes (Signed)
Assisted pt to bathroom. Pt was able to provide urine specimen. Sample sent to lab at this time.

## 2021-03-31 NOTE — ED Notes (Signed)
Pt ambulated to restroom with 1 person assist to provide urine sample.

## 2021-03-31 NOTE — ED Provider Notes (Signed)
[facility] Provider Note   CSN: [id number] Arrival date & time: 03/31/21  1507     History Chief Complaint  Patient presents with  . Emesis    Diamond [name] is a 25 y.o. female.  Patient has been vomiting for 4 days.  She has history of diabetes and gastroparesis.  She was seen yesterday and treated and sent home.  She returns with continued vomiting  The history is provided by the patient and medical records.  Emesis Severity:  Moderate Timing:  Constant Quality:  Bilious material Able to tolerate:  Liquids Progression:  Unchanged Chronicity:  Recurrent Recent urination:  Decreased Context: not post-tussive   Relieved by:  Nothing Associated symptoms: no abdominal pain, no cough, no diarrhea and no headaches        Past Medical History:  Diagnosis Date  . ADHD (attention deficit hyperactivity disorder) 2007  . Allergy   . Asthma   . Bipolar disorder ([place])   . Crohn's colitis ([place])    Dx Dr. Cherrie Gauze  . Depression    early childhood, trial of seroquel?  . Diabetic neuropathy ([place])   . Edema   . GERD (gastroesophageal reflux disease)   . Hypertension   . Pancreatitis   . Panic attacks   . PTSD (post-traumatic stress disorder)   . Stroke ([place]) 07/2020   R side  . Type 1 diabetes ([place]) 2008    Patient Active Problem List   Diagnosis Date Noted  . Low back pain without sciatica   . Intractable nausea and vomiting 03/16/2021  . Nausea and vomiting   . 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 Multifocal pneumonia 03/05/2021  . Bladder outlet obstruction   . Constipation   . Abdominal pain, epigastric   . Abnormal computed tomography of cecum and terminal ileum   . Hyperosmolar hyperglycemic state (HHS) ([place]) 01/30/2021  . Crohn's disease ([place]) 12/08/2020  . Gastroparesis 12/08/2020  . Essential hypertension, benign 11/01/2020  . Edema 10/19/2020  .  Anasarca 10/18/2020  . Hypoalbuminemia-due to proteinuria/nephrotic syndrome 10/18/2020  . Personal history of noncompliance with medical treatment, presenting hazards to health 10/12/2020  . Bilateral lower extremity edema 08/25/2020  . Pneumonia 08/25/2020  . Eczema 10/23/2012  . Abdominal pain 08/27/2012  . GERD (gastroesophageal reflux disease) 07/25/2012  . Mood disorder ([place]) 07/09/2012  . Contraception management 07/09/2012  . History of Crohn's disease 07/09/2012  . Uncontrolled type 1 diabetes mellitus with hyperglycemia and Diabetic Nephropathy 07/08/2012    Class: Chronic  . Asthma, persistent controlled 07/08/2012    Past Surgical History:  Procedure Laterality Date  . ADENOIDECTOMY    . BIOPSY  02/01/2021   Procedure: BIOPSY;  Surgeon: Rogene Houston, MD;  [facility];  Service: Endoscopy;;  duodenal, esophageal,  . BIOPSY  02/03/2021   Procedure: BIOPSY;  Surgeon: Eloise Harman, DO;  [facility];  Service: Endoscopy;;  . COLONOSCOPY WITH PROPOFOL N/A 02/01/2021   Procedure: COLONOSCOPY WITH PROPOFOL;  Surgeon: Rogene Houston, MD;  [facility];  Service: Endoscopy;  Laterality: N/A;  . COLONOSCOPY WITH PROPOFOL N/A 02/03/2021   Procedure: COLONOSCOPY WITH PROPOFOL;  Surgeon: Eloise Harman, DO;  [facility];  Service: Endoscopy;  Laterality: N/A;  . ESOPHAGOGASTRODUODENOSCOPY (EGD) WITH PROPOFOL N/A 02/01/2021   Procedure: ESOPHAGOGASTRODUODENOSCOPY (EGD) WITH PROPOFOL;  Surgeon: Rogene Houston, MD;  [facility];  Service: Endoscopy;  Laterality: N/A;  . HERNIA REPAIR    . TEE WITHOUT CARDIOVERSION N/A 03/09/2021   Procedure: TRANSESOPHAGEAL ECHOCARDIOGRAM (TEE) WITH PROPOFOL;  Surgeon: Arnoldo Lenis, MD;  [facility];  Service: Endoscopy;  Laterality: N/A;  . TONSILLECTOMY       OB History   No obstetric history on file.     Family History  Problem Relation Age of Onset  .  Hypertension Mother   . Diabetes Mother   . Heart disease Mother   . Crohn's disease Father   . Alcohol abuse Father   . Drug abuse Father   . Mental illness Father   . Cancer Father   . Hypertension Maternal Aunt   . Seizures Maternal Aunt   . Depression Maternal Aunt   . Asthma Maternal Uncle   . Hypertension Maternal Uncle   . Cancer Maternal Grandmother   . Diabetes Maternal Grandmother   . Mental illness Maternal Grandfather     Social History   Tobacco Use  . Smoking status: Never Smoker  . Smokeless tobacco: Never Used  Vaping Use  . Vaping Use: Former  Substance Use Topics  . Alcohol use: No  . Drug use: No    Home Medications Prior to Admission medications   Medication Sig Start Date End Date Taking? Authorizing Provider  albuterol (VENTOLIN HFA) 108 (90 Base) MCG/ACT inhaler Inhale 2 puffs into the lungs every 6 (six) hours as needed for wheezing or shortness of breath. For shortness of breath 03/10/21   Roxan Hockey, MD  ceFAZolin (ANCEF) IVPB Inject 2 g into the vein every 8 (eight) hours. Indication:  Bacteremia, possible discitis First Dose: No Last Day of Therapy:  04/17/21 Labs - Once weekly:  CBC/D and BMP, Labs - Every other week:  ESR and CRP Method of administration: IV Push Method of administration may be changed at the discretion of home infusion pharmacist based upon assessment of the patient and/or caregiver's ability to self-administer the medication ordered. 03/10/21 04/18/21  Roxan Hockey, MD  citalopram (CELEXA) 10 MG tablet Take 10 mg by mouth daily.    [provider]  diphenhydrAMINE (BENADRYL) 12.5 MG/5ML elixir Take 10 mLs (25 mg total) by mouth every 6 (six) hours as needed for itching, allergies or sleep. 03/10/21   Roxan Hockey, MD  gabapentin (NEURONTIN) 100 MG capsule Take 1 capsule (100 mg total) by mouth 2 (two) times daily. 03/10/21   Roxan Hockey, MD  hydrALAZINE (APRESOLINE) 25 MG tablet Take 1 tablet (25 mg total)  by mouth 2 (two) times daily. 03/10/21   Roxan Hockey, MD  insulin glargine (LANTUS) 100 UNIT/ML injection Inject 40 Units into the skin at bedtime.    [provider]  Insulin Glulisine (APIDRA [place]) Inject 10-16 Units into the skin 3 (three) times daily before meals. ssi    [provider]  isosorbide mononitrate (IMDUR) 30 MG 24 hr tablet Take 0.5 tablets (15 mg total) by mouth daily. 03/19/21   Barton Dubois, MD  metoCLOPramide (REGLAN) 5 MG tablet Take 1 tablet (5 mg total) by mouth every 8 (eight) hours as needed for refractory nausea / vomiting (refractory symptoms). 03/19/21 03/19/22  Barton Dubois, MD  metoprolol tartrate (LOPRESSOR) 50 MG tablet Take 1 tablet (50 mg total) by mouth 2 (two) times daily. 03/23/21   [name], PA-C  omeprazole (PRILOSEC) 40 MG capsule Take 1 capsule (40 mg total) by mouth in the morning and at bedtime. 03/23/21   Roslynn Amble,  [name], PA-C  ondansetron (ZOFRAN ODT) 8 MG disintegrating tablet Take 1 tablet (8 mg total) by mouth every 8 (eight) hours as needed for nausea or vomiting. 03/10/21   Roxan Hockey, MD  traZODone (DESYREL) 50 MG tablet Take 1 tablet (50 mg total) by mouth at bedtime. 03/10/21   Roxan Hockey, MD    Allergies    Amoxicillin, Dexamethasone, Doxycycline, Penicillins, Ceftriaxone sodium in dextrose, Augmentin [amoxicillin-pot clavulanate], Clavulanic acid, Adhesive [tape], Latex, Other, and Pineapple  Review of Systems   Review of Systems  Constitutional: Negative for appetite change and fatigue.  HENT: Negative for congestion, ear discharge and sinus pressure.   Eyes: Negative for discharge.  Respiratory: Negative for cough.   Cardiovascular: Negative for chest pain.  Gastrointestinal: Positive for vomiting. Negative for abdominal pain and diarrhea.  Genitourinary: Negative for frequency and hematuria.  Musculoskeletal: Negative for back pain.  Skin: Negative for rash.  Neurological: Negative for seizures and  headaches.  Psychiatric/Behavioral: Negative for hallucinations.    Physical Exam Updated Vital Signs BP (!) 160/133   Pulse (!) 114   Temp 99.6 F (37.6 C) (Oral)   Resp 14   Ht 4' 11" (1.499 m)   Wt 56.7 kg   SpO2 98%   BMI 25.25 kg/m   Physical Exam Vitals and nursing note reviewed.  Constitutional:      Appearance: She is well-developed.  HENT:     Head: Normocephalic.     Nose: Nose normal.  Eyes:     General: No scleral icterus.    Conjunctiva/sclera: Conjunctivae normal.  Neck:     Thyroid: No thyromegaly.  Cardiovascular:     Rate and Rhythm: Normal rate and regular rhythm.     Heart sounds: No murmur heard. No friction rub. No gallop.   Pulmonary:     Breath sounds: No stridor. No wheezing or rales.  Chest:     Chest wall: No tenderness.  Abdominal:     General: There is no distension.     Tenderness: There is no abdominal tenderness. There is no rebound.  Musculoskeletal:        General: Normal range of motion.     Cervical back: Neck supple.  Lymphadenopathy:     Cervical: No cervical adenopathy.  Skin:    Findings: No erythema or rash.  Neurological:     Mental Status: She is alert and oriented to person, place, and time.     Motor: No abnormal muscle tone.     Coordination: Coordination normal.  Psychiatric:        Behavior: Behavior normal.     ED Results / Procedures / Treatments   Labs (all labs ordered are listed, but only abnormal results are displayed) Labs Reviewed  CBC WITH DIFFERENTIAL/PLATELET - Abnormal; Notable for the following components:      Result Value   RBC 3.68 (*)    Hemoglobin 10.8 (*)    HCT 33.3 (*)    All other components within normal limits  COMPREHENSIVE METABOLIC PANEL - Abnormal; Notable for the following components:   Sodium 129 (*)    Chloride 96 (*)    Glucose, Bld 372 (*)    Creatinine, Ser 1.24 (*)    Calcium 7.9 (*)    Total Protein 6.3 (*)    Albumin 2.0 (*)    AST 10 (*)    All other  components within normal limits  URINALYSIS, ROUTINE W REFLEX MICROSCOPIC - Abnormal; Notable for the following components:  Color, Urine STRAW (*)    APPearance HAZY (*)    Glucose, UA >=500 (*)    Protein, ur >=300 (*)    Leukocytes,Ua TRACE (*)    All other components within normal limits  CBG MONITORING, ED - Abnormal; Notable for the following components:   Glucose-Capillary 368 (*)    All other components within normal limits  URINE CULTURE  SARS CORONAVIRUS 2 (TAT 6-24 HRS)  LIPASE, BLOOD  TROPONIN I (HIGH SENSITIVITY)  TROPONIN I (HIGH SENSITIVITY)    EKG None  Radiology CT ABDOMEN PELVIS W CONTRAST  Result Date: 03/31/2021 CLINICAL DATA:  Abdominal abscess or infection suspected. Abdominal pain, nausea and vomiting. EXAM: CT ABDOMEN AND PELVIS WITH CONTRAST TECHNIQUE: Multidetector CT imaging of the abdomen and pelvis was performed using the standard protocol following bolus administration of intravenous contrast. CONTRAST:  83m OMNIPAQUE IOHEXOL 300 MG/ML  SOLN COMPARISON:  Ultrasound 2 weeks ago. FINDINGS: Lower chest: Minimal basilar atelectasis. Hepatobiliary: No liver parenchymal finding. No calcified gallstones. Pancreas: Pancreas appears normal. Spleen: Normal Adrenals/Urinary Tract: Adrenal glands are normal. Right kidney shows an area of low-density in the lower pole consistent with focal pyelonephritis. No obstruction. Small subcapsular collection of the left kidney of maximal thickness measuring 8 mm, similar to the previous ultrasound. No renal obstruction. Bladder is full. Stomach/Bowel: No evidence of bowel obstruction. The appendix appears normal. There is a small amount of ascites. No focal finding. Vascular/Lymphatic: Normal Reproductive: Normal Other: No free air.  No hernia. Musculoskeletal: Normal IMPRESSION: 1. Abnormal low density in the lower pole the right kidney probably indicating focal pyelonephritis. No evidence of renal abscess. 2. Small amount of  ascites. No focal bowel finding. 3. Small subcapsular collection of the left kidney subsequent to previous renal biopsy, similar to the previous ultrasound. 4. Distended bladder. Electronically Signed   By: MNelson ChimesM.D.   On: 03/31/2021 18:03   DG Chest Port 1 View  Result Date: 03/31/2021 CLINICAL DATA:  Chest pain and dizziness EXAM: PORTABLE CHEST 1 VIEW COMPARISON:  March 16, 2021 FINDINGS: There remains atelectatic change in the medial right base. Lungs elsewhere are clear. Heart size and pulmonary vascularity are normal. No adenopathy. Central catheter tip is in the superior vena cava. No pneumothorax. No bone lesions. IMPRESSION: Central catheter tip in superior vena cava. No pneumothorax. Medial right base atelectasis. Lungs elsewhere clear. Cardiac silhouette within normal limits. Electronically Signed   By: WLowella GripIII M.D.   On: 03/31/2021 16:35    Procedures Procedures   Medications Ordered in ED Medications  insulin aspart (novoLOG) injection 8 Units (has no administration in time range)  ciprofloxacin (CIPRO) IVPB 400 mg (has no administration in time range)  metoCLOPramide (REGLAN) injection 10 mg (has no administration in time range)  sodium chloride 0.9 % bolus 1,000 mL (0 mLs Intravenous Stopped 03/31/21 1908)  ondansetron (ZOFRAN) injection 4 mg (4 mg Intravenous Given 03/31/21 1600)  pantoprazole (PROTONIX) injection 40 mg (40 mg Intravenous Given 03/31/21 1602)  iohexol (OMNIPAQUE) 300 MG/ML solution 100 mL (75 mLs Intravenous Contrast Given 03/31/21 1723)  sodium chloride 0.9 % bolus 1,000 mL (0 mLs Intravenous Stopped 03/31/21 2004)  ondansetron (ZOFRAN) injection 4 mg (4 mg Intravenous Given 03/31/21 1908)  HYDROmorphone (DILAUDID) injection 0.5 mg (0.5 mg Intravenous Given 03/31/21 1909)   CRITICAL CARE Performed by: JMilton FergusonTotal critical care time: 40 minutes Critical care time was exclusive of separately billable procedures and treating other  patients. Critical care was necessary to  treat or prevent imminent or life-threatening deterioration. Critical care was time spent personally by me on the following activities: development of treatment plan with patient and/or surrogate as well as nursing, discussions with consultants, evaluation of patient's response to treatment, examination of patient, obtaining history from patient or surrogate, ordering and performing treatments and interventions, ordering and review of laboratory studies, ordering and review of radiographic studies, pulse oximetry and re-evaluation of patient's condition.  ED Course  I have reviewed the triage vital signs and the nursing notes.  Pertinent labs & imaging results that were available during my care of the patient were reviewed by me and considered in my medical decision making (see chart for details).    MDM Rules/Calculators/A&P                          Patient is admitted for continued vomiting gastroparesis and poorly controlled diabetes Final Clinical Impression(s) / ED Diagnoses Final diagnoses:  Gastroparesis    Rx / DC Orders ED Discharge Orders    None       Milton Ferguson, MD 04/01/21 [phone]

## 2021-04-01 ENCOUNTER — Encounter (HOSPITAL_COMMUNITY): Payer: Self-pay | Admitting: Family Medicine

## 2021-04-01 DIAGNOSIS — E1143 Type 2 diabetes mellitus with diabetic autonomic (poly)neuropathy: Secondary | ICD-10-CM

## 2021-04-01 DIAGNOSIS — N179 Acute kidney failure, unspecified: Secondary | ICD-10-CM

## 2021-04-01 DIAGNOSIS — K3184 Gastroparesis: Secondary | ICD-10-CM

## 2021-04-01 DIAGNOSIS — E1065 Type 1 diabetes mellitus with hyperglycemia: Secondary | ICD-10-CM

## 2021-04-01 DIAGNOSIS — N12 Tubulo-interstitial nephritis, not specified as acute or chronic: Secondary | ICD-10-CM

## 2021-04-01 LAB — GLUCOSE, CAPILLARY
Glucose-Capillary: 157 mg/dL — ABNORMAL HIGH (ref 70–99)
Glucose-Capillary: 73 mg/dL (ref 70–99)
Glucose-Capillary: 126 mg/dL — ABNORMAL HIGH (ref 70–99)
Glucose-Capillary: 160 mg/dL — ABNORMAL HIGH (ref 70–99)
Glucose-Capillary: 235 mg/dL — ABNORMAL HIGH (ref 70–99)
Glucose-Capillary: 208 mg/dL — ABNORMAL HIGH (ref 70–99)

## 2021-04-01 LAB — COMPREHENSIVE METABOLIC PANEL
ALT: 8 U/L (ref 0–44)
AST: 11 U/L — ABNORMAL LOW (ref 15–41)
Albumin: 1.6 g/dL — ABNORMAL LOW (ref 3.5–5.0)
Alkaline Phosphatase: 86 U/L (ref 38–126)
Anion gap: 4 — ABNORMAL LOW (ref 5–15)
BUN: 9 mg/dL (ref 6–20)
CO2: 28 mmol/L (ref 22–32)
Calcium: 8 mg/dL — ABNORMAL LOW (ref 8.9–10.3)
Chloride: 103 mmol/L (ref 98–111)
Creatinine, Ser: 1.09 mg/dL — ABNORMAL HIGH (ref 0.44–1.00)
GFR, Estimated: >60 mL/min (ref >60)
Glucose, Bld: 121 mg/dL — ABNORMAL HIGH (ref 70–99)
Potassium: 3.2 mmol/L — ABNORMAL LOW (ref 3.5–5.1)
Sodium: 135 mmol/L (ref 135–145)
Total Bilirubin: 0.6 mg/dL (ref 0.3–1.2)
Total Protein: 5.4 g/dL — ABNORMAL LOW (ref 6.5–8.1)

## 2021-04-01 LAB — CBC
HCT: 30.7 % — ABNORMAL LOW (ref 36.0–46.0)
Hemoglobin: 10 g/dL — ABNORMAL LOW (ref 12.0–15.0)
MCH: 29.1 pg (ref 26.0–34.0)
MCHC: 32.6 g/dL (ref 30.0–36.0)
MCV: 89.2 fL (ref 80.0–100.0)
Platelets: 228 10*3/uL (ref 150–400)
RBC: 3.44 MIL/uL — ABNORMAL LOW (ref 3.87–5.11)
RDW: 12.8 % (ref 11.5–15.5)
WBC: 4.5 10*3/uL (ref 4.0–10.5)
nRBC: 0 % (ref 0.0–0.2)

## 2021-04-01 LAB — SARS CORONAVIRUS 2 (TAT 6-24 HRS): SARS Coronavirus 2: NEGATIVE

## 2021-04-01 LAB — MAGNESIUM: Magnesium: 1.5 mg/dL — ABNORMAL LOW (ref 1.7–2.4)

## 2021-04-01 MED ORDER — TRAZODONE HCL 50 MG PO TABS: 50.0000 mg | ORAL_TABLET | Freq: Every day | ORAL | Status: DC

## 2021-04-01 MED ORDER — CITALOPRAM HYDROBROMIDE 10 MG PO TABS
10.0000 mg | ORAL_TABLET | Freq: Every day | ORAL | Status: DC
  Administered 2021-04-02: 10 mg via ORAL
  Filled 2021-04-01 (×2): qty 1

## 2021-04-01 MED ORDER — PANTOPRAZOLE SODIUM 40 MG PO TBEC
40.0000 mg | DELAYED_RELEASE_TABLET | Freq: Every day | ORAL | Status: DC
  Administered 2021-04-01 – 2021-04-02 (×2): 40 mg via ORAL
  Filled 2021-04-01 (×2): qty 1

## 2021-04-01 MED ORDER — METOPROLOL TARTRATE 50 MG PO TABS
50.0000 mg | ORAL_TABLET | Freq: Two times a day (BID) | ORAL | Status: DC
  Administered 2021-04-01 – 2021-04-02 (×3): 50 mg via ORAL
  Filled 2021-04-01 (×3): qty 1

## 2021-04-01 MED ORDER — OXYCODONE HCL 5 MG PO TABS
5.0000 mg | ORAL_TABLET | ORAL | Status: DC | PRN
Start: 2021-04-01 — End: 2021-04-02
  Administered 2021-04-01: 5 mg via ORAL
  Filled 2021-04-01 (×2): qty 1

## 2021-04-01 MED ORDER — HYDROCHLOROTHIAZIDE 25 MG PO TABS
25.0000 mg | ORAL_TABLET | Freq: Every day | ORAL | Status: DC
  Administered 2021-04-01 – 2021-04-02 (×2): 25 mg via ORAL
  Filled 2021-04-01 (×2): qty 1

## 2021-04-01 MED ORDER — ONDANSETRON HCL 4 MG/2ML IJ SOLN
4.0000 mg | Freq: Four times a day (QID) | INTRAMUSCULAR | Status: DC | PRN
  Administered 2021-04-01: 4 mg via INTRAVENOUS
  Filled 2021-04-01: qty 2

## 2021-04-01 MED ORDER — POTASSIUM CHLORIDE CRYS ER 20 MEQ PO TBCR
40.0000 meq | EXTENDED_RELEASE_TABLET | Freq: Once | ORAL | Status: AC
  Administered 2021-04-01: 40 meq via ORAL
  Filled 2021-04-01: qty 2

## 2021-04-01 MED ORDER — HYDRALAZINE HCL 25 MG PO TABS
25.0000 mg | ORAL_TABLET | Freq: Two times a day (BID) | ORAL | Status: DC
  Administered 2021-04-01 – 2021-04-02 (×3): 25 mg via ORAL
  Filled 2021-04-01 (×3): qty 1

## 2021-04-01 MED ORDER — MAGNESIUM SULFATE 2 GM/50ML IV SOLN
2.0000 g | Freq: Once | INTRAVENOUS | Status: AC
  Administered 2021-04-01: 2 g via INTRAVENOUS
  Filled 2021-04-01: qty 50

## 2021-04-01 MED ORDER — INSULIN GLARGINE 100 UNIT/ML ~~LOC~~ SOLN
30.0000 [IU] | Freq: Every day | SUBCUTANEOUS | Status: DC
  Administered 2021-04-01: 30 [IU] via SUBCUTANEOUS
  Filled 2021-04-01 (×2): qty 0.3

## 2021-04-01 MED ORDER — ALBUTEROL SULFATE HFA 108 (90 BASE) MCG/ACT IN AERS: 2.0000 | INHALATION_SPRAY | Freq: Four times a day (QID) | RESPIRATORY_TRACT | Status: DC | PRN

## 2021-04-01 MED ORDER — ACETAMINOPHEN 325 MG PO TABS
650.0000 mg | ORAL_TABLET | Freq: Four times a day (QID) | ORAL | Status: DC | PRN
  Administered 2021-04-01 – 2021-04-02 (×2): 650 mg via ORAL
  Filled 2021-04-01 (×2): qty 2

## 2021-04-01 MED ORDER — ISOSORBIDE MONONITRATE ER 30 MG PO TB24
15.0000 mg | ORAL_TABLET | Freq: Every day | ORAL | Status: DC
  Administered 2021-04-01 – 2021-04-02 (×2): 15 mg via ORAL
  Filled 2021-04-01 (×2): qty 1

## 2021-04-01 MED ORDER — INSULIN ASPART 100 UNIT/ML IJ SOLN
0.0000 [IU] | Freq: Every day | INTRAMUSCULAR | Status: DC
  Administered 2021-04-01: 2 [IU] via SUBCUTANEOUS

## 2021-04-01 MED ORDER — FUROSEMIDE 20 MG PO TABS
20.0000 mg | ORAL_TABLET | Freq: Every day | ORAL | Status: DC
  Filled 2021-04-01: qty 1

## 2021-04-01 MED ORDER — SODIUM CHLORIDE 0.9 % IV SOLN: INTRAVENOUS | Status: AC

## 2021-04-01 MED ORDER — INSULIN GLARGINE 100 UNIT/ML ~~LOC~~ SOLN
40.0000 [IU] | Freq: Every day | SUBCUTANEOUS | Status: DC
  Administered 2021-04-01: 40 [IU] via SUBCUTANEOUS
  Filled 2021-04-01 (×3): qty 0.4

## 2021-04-01 MED ORDER — ACETAMINOPHEN 650 MG RE SUPP: 650.0000 mg | Freq: Four times a day (QID) | RECTAL | Status: DC | PRN

## 2021-04-01 MED ORDER — INSULIN ASPART 100 UNIT/ML IJ SOLN
0.0000 [IU] | Freq: Three times a day (TID) | INTRAMUSCULAR | Status: DC
  Administered 2021-04-01: 4 [IU] via SUBCUTANEOUS
  Administered 2021-04-01: 7 [IU] via SUBCUTANEOUS

## 2021-04-01 MED ORDER — SODIUM CHLORIDE 0.9 % IV SOLN
25.0000 mg | Freq: Four times a day (QID) | INTRAVENOUS | Status: DC | PRN
  Administered 2021-04-01: 25 mg via INTRAVENOUS
  Filled 2021-04-01: qty 1

## 2021-04-01 MED ORDER — ONDANSETRON HCL 4 MG PO TABS
4.0000 mg | ORAL_TABLET | Freq: Four times a day (QID) | ORAL | Status: DC | PRN
  Filled 2021-04-01: qty 1

## 2021-04-01 MED ORDER — GABAPENTIN 100 MG PO CAPS
100.0000 mg | ORAL_CAPSULE | Freq: Two times a day (BID) | ORAL | Status: DC
  Administered 2021-04-01 – 2021-04-02 (×3): 100 mg via ORAL
  Filled 2021-04-01 (×3): qty 1

## 2021-04-01 MED ORDER — HEPARIN SODIUM (PORCINE) 5000 UNIT/ML IJ SOLN
5000.0000 [IU] | Freq: Three times a day (TID) | INTRAMUSCULAR | Status: DC
  Administered 2021-04-02: 5000 [IU] via SUBCUTANEOUS
  Filled 2021-04-01: qty 1

## 2021-04-01 NOTE — Progress Notes (Signed)
Diamond [name]  is a 25 y.o. female, with history of type 2 diabetes, stroke, hypertension, GERD, psychiatric disorder, and more presents the ED with a chief complaint of chest pain.  Patient did report to the ED yesterday with nausea and diarrhea.  Patient was admitted with pyelonephritis/UTI and has been started on IV ciprofloxacin.  She is noted to have history of diabetic gastroparesis and continues to have some nausea and vomiting as well.  Pyelonephritis/UTI -Continue IV ciprofloxacin -Urine culture pending  Mild AKI -Likely prerenal, improving -Continue monitoring  Hypokalemia/hypomagnesemia -Replete and reevaluate in a.m.  Gastroparesis -Scheduled IV Reglan -Use Zofran as needed for vertigo as needed  Uncontrolled diabetes -SSI -Clinical plan  No family at bedside, patient will call  Total care time: 30 minutes.

## 2021-04-01 NOTE — H&P (Signed)
TRH H&P    Patient Demographics:    Diamond [name], is a 25 y.o. female  MRN: [id number]  DOB - 1996-03-27  Admit Date - 03/31/2021  Referring MD/NP/PA: Roderic Palau  Outpatient Primary MD for the patient is [name], PA-C  Patient coming from: Home  Chief complaint-chest pain   HPI:    Diamond [name]  is a 25 y.o. female, with history of type 2 diabetes, stroke, hypertension, GERD, psychiatric disorder, and more presents the ED with a chief complaint of chest pain.  Patient did report to the ED yesterday with nausea and diarrhea.  At that time her sugar was well controlled, she was hydrated and given some antiemetics and sent home.  Today she presents with chest pain and headaches.  She reports that these started 4 days ago.  Symptoms started with the nausea and vomiting first.  She had abdominal pain that she describes as cramps or spasms in the lower and epigastric regions.  Patient reports that the abdominal pain improves with vomiting and with diarrhea.  She describes her emesis is green, nonbloody.  Her diarrhea is mucousy and brown without blood.  She describes her chest pain as spasm and pressure in the middle of her chest.  Been present for 4 days but worsened yesterday.  She has associated hand and leg paresthesias, no radiation of the pain, no palpitations.  She admits to dizziness.  Her dizziness can occur whether she is lying down or sitting or standing up.  Patient denies any dysuria or hematuria.  She reports that she has felt feverish at home.  Patient does report that she has had a burning sensation in her left flank since a kidney biopsy was done.  She is not sure when the biopsy was done.  Patient reports that she does not smoke, does not drink alcohol, does not use illicit drugs.  She reports she is vaccinated for COVID.  She is full code.  In the ED Temp 98.6, heart rate 101-114, respiratory  rate 12-21, blood pressure 160/133 UA borderline for UTI, urine culture pending, CT shows evidence of pyelonephritis No leukocytosis with a white blood cell count of 7.3, hemoglobin 10.8 Chemistry panel reveals a pseudohyponatremia at 129 with a glucose of 372 -patient reports she may have missed her insulin dose Creatinine is bumped to 1.24 Troponin is 10 ,11 Dilaudid Zofran and Protonix given in the ED 2 L normal saline given in the ED Admission requested for management of pyelonephritis/AKI    Review of systems:    In addition to the HPI above,  Admits to fever at home No Headache, No changes with Vision or hearing, No problems swallowing food or Liquids, Admits to chest pain, Cough or Shortness of Breath, Admits to abdominal pain, nausea, vomiting, diarrhea No Blood in stool or Urine, No dysuria, No new skin rashes or bruises, No new joints pains-aches,  No new weakness, tingling, numbness in any extremity, No recent weight gain or loss, No polyuria, polydypsia or polyphagia, No significant Mental Stressors.  All other systems reviewed  and are negative.    Past History of the following :    Past Medical History:  Diagnosis Date  . ADHD (attention deficit hyperactivity disorder) 2007  . Allergy   . Asthma   . Bipolar disorder ([place])   . Crohn's colitis ([place])    Dx Dr. Cherrie Gauze  . Depression    early childhood, trial of seroquel?  . Diabetic neuropathy ([place])   . Edema   . GERD (gastroesophageal reflux disease)   . Hypertension   . Pancreatitis   . Panic attacks   . PTSD (post-traumatic stress disorder)   . Stroke ([place]) 07/2020   R side  . Type 1 diabetes ([place]) 2008      Past Surgical History:  Procedure Laterality Date  . ADENOIDECTOMY    . BIOPSY  02/01/2021   Procedure: BIOPSY;  Surgeon: Rogene Houston, MD;  [facility];  Service: Endoscopy;;  duodenal, esophageal,  . BIOPSY  02/03/2021   Procedure: BIOPSY;  Surgeon: Eloise Harman, DO;  [facility];  Service: Endoscopy;;  . COLONOSCOPY WITH PROPOFOL N/A 02/01/2021   Procedure: COLONOSCOPY WITH PROPOFOL;  Surgeon: Rogene Houston, MD;  [facility];  Service: Endoscopy;  Laterality: N/A;  . COLONOSCOPY WITH PROPOFOL N/A 02/03/2021   Procedure: COLONOSCOPY WITH PROPOFOL;  Surgeon: Eloise Harman, DO;  [facility];  Service: Endoscopy;  Laterality: N/A;  . ESOPHAGOGASTRODUODENOSCOPY (EGD) WITH PROPOFOL N/A 02/01/2021   Procedure: ESOPHAGOGASTRODUODENOSCOPY (EGD) WITH PROPOFOL;  Surgeon: Rogene Houston, MD;  [facility];  Service: Endoscopy;  Laterality: N/A;  . HERNIA REPAIR    . TEE WITHOUT CARDIOVERSION N/A 03/09/2021   Procedure: TRANSESOPHAGEAL ECHOCARDIOGRAM (TEE) WITH PROPOFOL;  Surgeon: Arnoldo Lenis, MD;  [facility];  Service: Endoscopy;  Laterality: N/A;  . TONSILLECTOMY        Social History:      Social History   Tobacco Use  . Smoking status: Never Smoker  . Smokeless tobacco: Never Used  Substance Use Topics  . Alcohol use: No       Family History :     Family History  Problem Relation Age of Onset  . Hypertension Mother   . Diabetes Mother   . Heart disease Mother   . Crohn's disease Father   . Alcohol abuse Father   . Drug abuse Father   . Mental illness Father   . Cancer Father   . Hypertension Maternal Aunt   . Seizures Maternal Aunt   . Depression Maternal Aunt   . Asthma Maternal Uncle   . Hypertension Maternal Uncle   . Cancer Maternal Grandmother   . Diabetes Maternal Grandmother   . Mental illness Maternal Grandfather       Home Medications:   Prior to Admission medications   Medication Sig Start Date End Date Taking? Authorizing Provider  acetaminophen (TYLENOL) 500 MG tablet Take by mouth. 04/21/20  Yes [provider]  albuterol (VENTOLIN HFA) 108 (90 Base) MCG/ACT inhaler Inhale 2 puffs into the lungs every 6 (six) hours as needed for  wheezing or shortness of breath. For shortness of breath 03/10/21  Yes Emokpae, Courage, MD  ceFAZolin (ANCEF) IVPB Inject 2 g into the vein every 8 (eight) hours. Indication:  Bacteremia, possible discitis First Dose: No Last Day of Therapy:  04/17/21 Labs - Once weekly:  CBC/D and BMP, Labs - Every other week:  ESR and CRP Method of administration:  IV Push Method of administration may be changed at the discretion of home infusion pharmacist based upon assessment of the patient and/or caregiver's ability to self-administer the medication ordered. 03/10/21 04/18/21 Yes Emokpae, Courage, MD  citalopram (CELEXA) 10 MG tablet Take 10 mg by mouth daily.   Yes [provider]  diphenhydrAMINE (BENADRYL) 12.5 MG/5ML elixir Take 10 mLs (25 mg total) by mouth every 6 (six) hours as needed for itching, allergies or sleep. 03/10/21  Yes Emokpae, Courage, MD  furosemide (LASIX) 20 MG tablet Take 20 mg by mouth daily. 04/25/20  Yes [provider]  gabapentin (NEURONTIN) 100 MG capsule Take 1 capsule (100 mg total) by mouth 2 (two) times daily. 03/10/21  Yes Emokpae, Courage, MD  hydrALAZINE (APRESOLINE) 25 MG tablet Take 1 tablet (25 mg total) by mouth 2 (two) times daily. 03/10/21  Yes Emokpae, Courage, MD  hydrochlorothiazide (HYDRODIURIL) 25 MG tablet Take 25 mg by mouth daily. 02/11/20  Yes [provider]  insulin glargine (LANTUS) 100 UNIT/ML injection Inject 40 Units into the skin at bedtime.   Yes [provider]  Insulin Glulisine (APIDRA [place]) Inject 10-16 Units into the skin 3 (three) times daily before meals. ssi   Yes [provider]  isosorbide mononitrate (IMDUR) 30 MG 24 hr tablet Take 0.5 tablets (15 mg total) by mouth daily. 03/19/21  Yes Barton Dubois, MD  metoCLOPramide (REGLAN) 5 MG tablet Take 1 tablet (5 mg total) by mouth every 8 (eight) hours as needed for refractory nausea / vomiting (refractory symptoms). 03/19/21 03/19/22 Yes Barton Dubois, MD   metoprolol tartrate (LOPRESSOR) 50 MG tablet Take 1 tablet (50 mg total) by mouth 2 (two) times daily. 03/23/21  Yes [name], PA-C  omeprazole (PRILOSEC) 40 MG capsule Take 1 capsule (40 mg total) by mouth in the morning and at bedtime. 03/23/21  Yes [name], PA-C  potassium chloride (KLOR-CON) 10 MEQ tablet Take 10 mEq by mouth daily. 10/21/20  Yes [provider]  traZODone (DESYREL) 50 MG tablet Take 1 tablet (50 mg total) by mouth at bedtime. 03/10/21  Yes Emokpae, Courage, MD  ondansetron (ZOFRAN ODT) 8 MG disintegrating tablet Take 1 tablet (8 mg total) by mouth every 8 (eight) hours as needed for nausea or vomiting. Patient not taking: Reported on 03/31/2021 03/10/21   Roxan Hockey, MD     Allergies:     Allergies  Allergen Reactions  . Amoxicillin Hives    Did it involve swelling of the face/tongue/throat, SOB, or low BP? no  Did it involve sudden or severe rash/hives, skin peeling, or any reaction on the inside of your mouth or nose? no Did you need to seek medical attention at a hospital or doctor's office? no When did it last happen?unk If all above answers are "NO", may proceed with cephalosporin use.  Marland Kitchen Dexamethasone Hives and Itching  . Doxycycline Itching and Swelling  . Penicillins Hives and Itching    Did it involve swelling of the face/tongue/throat, SOB, or low BP? no  Did it involve sudden or severe rash/hives, skin peeling, or any reaction on the inside of your mouth or nose? no Did you need to seek medical attention at a hospital or doctor's office? no When did it last happen?unk If all above answers are "NO", may proceed with cephalosporin use. Did it involve swelling of the face/tongue/throat, SOB, or low BP? no  Did it involve sudden or severe rash/hives, skin peeling, or any reac  . Ceftriaxone Sodium In  Dextrose Itching  . Augmentin [Amoxicillin-Pot Clavulanate] Other (See Comments)    Reaction unknown  . Clavulanic Acid    . Adhesive [Tape] Itching and Rash  . Latex Itching and Rash  . Other Itching and Rash  . Pineapple Rash    Rash on tongue and throat     Physical Exam:   Vitals  Blood pressure (!) 170/111, pulse (!) 112, temperature (!) 101.1 F (38.4 C), temperature source Oral, resp. rate 16, height 4' 11"  (1.499 m), weight 56.7 kg, SpO2 100 %.  1.  General: Patient lying left lateral decubitus in bed with blanket over her head upon my entry into the room, no acute distress  2. Psychiatric: Flat affect, partially cooperative with exam, has to be repeatedly reminded to answer questions and take the blanket off of her head, oriented x3  3. Neurologic: Face is symmetric, speech and language are normal, moves all 4 extremities voluntarily, no focal deficit on limited exam  4. HEENMT:  Head is atraumatic, lids, pupils reactive to light, neck is supple, trachea is midline, mucous membranes are moist  5. Respiratory : Lungs are clear to auscultation bilaterally without wheezing, rhonchi, rales, no increased work of breathing  6. Cardiovascular : Heart rate is normal, rhythm is regular, no murmurs rubs or gallops, peripheral edema presents  7. Gastrointestinal:  Abdomen is soft, nondistended, minimally tender to palpation, bowel sounds active  8. Skin:  Skin is warm dry and intact without acute lesion on limited exam  9.Musculoskeletal:  Symmetric peripheral edema, no no calf tenderness, no acute deformities    Data Review:    CBC Recent Labs  Lab 03/30/21 2239 03/31/21 1614  WBC 6.8 7.3  HGB 11.3* 10.8*  HCT 34.1* 33.3*  PLT 293 232  MCV 89.0 90.5  MCH 29.5 29.3  MCHC 33.1 32.4  RDW 13.2 13.2  LYMPHSABS 1.8 0.8  MONOABS 0.4 0.3  EOSABS 0.4 0.1  BASOSABS 0.1 0.0   ------------------------------------------------------------------------------------------------------------------  Results for orders placed or performed during the hospital encounter of 03/31/21 (from the  past 48 hour(s))  CBG monitoring, ED     Status: Abnormal   Collection Time: 03/31/21  3:30 PM  Result Value Ref Range   Glucose-Capillary 368 (H) 70 - 99 mg/dL    Comment: Glucose reference range applies only to samples taken after fasting for at least 8 hours.  CBC with Differential/Platelet     Status: Abnormal   Collection Time: 03/31/21  4:14 PM  Result Value Ref Range   WBC 7.3 4.0 - 10.5 K/uL   RBC 3.68 (L) 3.87 - 5.11 MIL/uL   Hemoglobin 10.8 (L) 12.0 - 15.0 g/dL   HCT 33.3 (L) 36.0 - 46.0 %   MCV 90.5 80.0 - 100.0 fL   MCH 29.3 26.0 - 34.0 pg   MCHC 32.4 30.0 - 36.0 g/dL   RDW 13.2 11.5 - 15.5 %   Platelets 232 150 - 400 K/uL   nRBC 0.0 0.0 - 0.2 %   Neutrophils Relative % 82 %   Neutro Abs 6.0 1.7 - 7.7 K/uL   Lymphocytes Relative 12 %   Lymphs Abs 0.8 0.7 - 4.0 K/uL   Monocytes Relative 4 %   Monocytes Absolute 0.3 0.1 - 1.0 K/uL   Eosinophils Relative 1 %   Eosinophils Absolute 0.1 0.0 - 0.5 K/uL   Basophils Relative 1 %   Basophils Absolute 0.0 0.0 - 0.1 K/uL   Immature Granulocytes 0 %   Abs Immature  Granulocytes 0.03 0.00 - 0.07 K/uL    Comment: Performed at [facility], [address] [place], [place] [address]  Comprehensive metabolic panel     Status: Abnormal   Collection Time: 03/31/21  4:14 PM  Result Value Ref Range   Sodium 129 (L) 135 - 145 mmol/L    Comment: DELTA CHECK NOTED   Potassium 3.5 3.5 - 5.1 mmol/L   Chloride 96 (L) 98 - 111 mmol/L   CO2 24 22 - 32 mmol/L   Glucose, Bld 372 (H) 70 - 99 mg/dL    Comment: Glucose reference range applies only to samples taken after fasting for at least 8 hours.   BUN 11 6 - 20 mg/dL   Creatinine, Ser 1.24 (H) 0.44 - 1.00 mg/dL   Calcium 7.9 (L) 8.9 - 10.3 mg/dL   Total Protein 6.3 (L) 6.5 - 8.1 g/dL   Albumin 2.0 (L) 3.5 - 5.0 g/dL   AST 10 (L) 15 - 41 U/L   ALT 8 0 - 44 U/L   Alkaline Phosphatase 107 38 - 126 U/L   Total Bilirubin 0.8 0.3 - 1.2 mg/dL   GFR, Estimated >60 >60 mL/min    Comment:  (NOTE) Calculated using the CKD-EPI Creatinine Equation (2021)    Anion gap 9 5 - 15    Comment: Performed at [facility], [address], [place], [place] [address]  Lipase, blood     Status: None   Collection Time: 03/31/21  4:14 PM  Result Value Ref Range   Lipase 20 11 - 51 U/L    Comment: Performed at [facility], [address] [place],  [place] [address]  Troponin I (High Sensitivity)     Status: None   Collection Time: 03/31/21  4:14 PM  Result Value Ref Range   Troponin I (High Sensitivity) 10 <18 ng/L    Comment: (NOTE) Elevated high sensitivity troponin I (hsTnI) values and significant  changes across serial measurements may suggest ACS but many other  chronic and acute conditions are known to elevate hsTnI results.  Refer to the Links section for chest pain algorithms and additional  guidance. Performed at [facility], [address], [place], [place] [address]   Troponin I (High Sensitivity)     Status: None   Collection Time: 03/31/21  5:18 PM  Result Value Ref Range   Troponin I (High Sensitivity) 11 <18 ng/L    Comment: (NOTE) Elevated high sensitivity troponin I (hsTnI) values and significant  changes across serial measurements may suggest ACS but many other  chronic and acute conditions are known to elevate hsTnI results.  Refer to the "Links" section for chest pain algorithms and additional  guidance. Performed at [facility], [address], [place],  [address]   Urinalysis, Routine w reflex microscopic Urine, Clean Catch     Status: Abnormal   Collection Time: 03/31/21  7:05 PM  Result Value Ref Range   Color, Urine STRAW (A) YELLOW   APPearance HAZY (A) CLEAR   Specific Gravity, Urine 1.011 1.005 - 1.030   pH 7.0 5.0 - 8.0   Glucose, UA >=500 (A) NEGATIVE mg/dL   Hgb urine dipstick NEGATIVE NEGATIVE   Bilirubin Urine NEGATIVE NEGATIVE   Ketones, ur NEGATIVE NEGATIVE mg/dL   Protein, ur >=300 (A) NEGATIVE mg/dL   Nitrite NEGATIVE NEGATIVE    Leukocytes,Ua TRACE (A) NEGATIVE   RBC / HPF 0-5 0 - 5 RBC/hpf   WBC, UA 11-20 0 - 5 WBC/hpf  Bacteria, UA NONE SEEN NONE SEEN   Squamous Epithelial / LPF 0-5 0 - 5    Comment: Performed at [facility], [address] [place], [place] [address]  Glucose, capillary     Status: Abnormal   Collection Time: 04/01/21  2:40 AM  Result Value Ref Range   Glucose-Capillary 157 (H) 70 - 99 mg/dL    Comment: Glucose reference range applies only to samples taken after fasting for at least 8 hours.    Chemistries  Recent Labs  Lab 03/30/21 2239 03/31/21 1614  NA 138 129*  K 3.4* 3.5  CL 101 96*  CO2 29 24  GLUCOSE 156* 372*  BUN 11 11  CREATININE 0.89 1.24*  CALCIUM 8.6* 7.9*  AST 12* 10*  ALT 8 8  ALKPHOS 112 107  BILITOT 0.6 0.8   ------------------------------------------------------------------------------------------------------------------  ------------------------------------------------------------------------------------------------------------------ GFR: Estimated Creatinine Clearance: 53.7 mL/min (A) (by C-G formula based on SCr of 1.24 mg/dL (H)). Liver Function Tests: Recent Labs  Lab 03/30/21 2239 03/31/21 1614  AST 12* 10*  ALT 8 8  ALKPHOS 112 107  BILITOT 0.6 0.8  PROT 6.7 6.3*  ALBUMIN 2.1* 2.0*   Recent Labs  Lab 03/31/21 1614  LIPASE 20   No results for input(s): AMMONIA in the last 168 hours. Coagulation Profile: No results for input(s): INR, PROTIME in the last 168 hours. Cardiac Enzymes: No results for input(s): CKTOTAL, CKMB, CKMBINDEX, TROPONINI in the last 168 hours. BNP (last 3 results) No results for input(s): PROBNP in the last 8760 hours. HbA1C: No results for input(s): HGBA1C in the last 72 hours. CBG: Recent Labs  Lab 03/26/21 1840 03/26/21 2014 03/31/21 1530 04/01/21 0240  GLUCAP 276* 252* 368* 157*   Lipid Profile: No results for input(s): CHOL, HDL, LDLCALC, TRIG, CHOLHDL, LDLDIRECT in the last 72 hours. Thyroid Function  Tests: No results for input(s): TSH, T4TOTAL, FREET4, T3FREE, THYROIDAB in the last 72 hours. Anemia Panel: No results for input(s): VITAMINB12, FOLATE, FERRITIN, TIBC, IRON, RETICCTPCT in the last 72 hours.  --------------------------------------------------------------------------------------------------------------- Urine analysis:    Component Value Date/Time   COLORURINE STRAW (A) 03/31/2021 1905   APPEARANCEUR HAZY (A) 03/31/2021 1905   APPEARANCEUR Clear 11/15/2020 1531   LABSPEC 1.011 03/31/2021 1905   PHURINE 7.0 03/31/2021 1905   GLUCOSEU >=500 (A) 03/31/2021 1905   HGBUR NEGATIVE 03/31/2021 1905   BILIRUBINUR NEGATIVE 03/31/2021 1905   BILIRUBINUR NEG 12/05/2020 1353   BILIRUBINUR Negative 11/15/2020 1531   KETONESUR NEGATIVE 03/31/2021 1905   PROTEINUR >=300 (A) 03/31/2021 1905   UROBILINOGEN 0.2 12/05/2020 1353   UROBILINOGEN 0.2 09/13/2012 2014   NITRITE NEGATIVE 03/31/2021 1905   LEUKOCYTESUR TRACE (A) 03/31/2021 1905      Imaging Results:    CT ABDOMEN PELVIS W CONTRAST  Result Date: 03/31/2021 CLINICAL DATA:  Abdominal abscess or infection suspected. Abdominal pain, nausea and vomiting. EXAM: CT ABDOMEN AND PELVIS WITH CONTRAST TECHNIQUE: Multidetector CT imaging of the abdomen and pelvis was performed using the standard protocol following bolus administration of intravenous contrast. CONTRAST:  25m OMNIPAQUE IOHEXOL 300 MG/ML  SOLN COMPARISON:  Ultrasound 2 weeks ago. FINDINGS: Lower chest: Minimal basilar atelectasis. Hepatobiliary: No liver parenchymal finding. No calcified gallstones. Pancreas: Pancreas appears normal. Spleen: Normal Adrenals/Urinary Tract: Adrenal glands are normal. Right kidney shows an area of low-density in the lower pole consistent with focal pyelonephritis. No obstruction. Small subcapsular collection of the left kidney of maximal thickness measuring 8 mm, similar to the previous ultrasound. No renal obstruction. Bladder is  full.  Stomach/Bowel: No evidence of bowel obstruction. The appendix appears normal. There is a small amount of ascites. No focal finding. Vascular/Lymphatic: Normal Reproductive: Normal Other: No free air.  No hernia. Musculoskeletal: Normal IMPRESSION: 1. Abnormal low density in the lower pole the right kidney probably indicating focal pyelonephritis. No evidence of renal abscess. 2. Small amount of ascites. No focal bowel finding. 3. Small subcapsular collection of the left kidney subsequent to previous renal biopsy, similar to the previous ultrasound. 4. Distended bladder. Electronically Signed   By: Nelson Chimes M.D.   On: 03/31/2021 18:03   DG Chest Port 1 View  Result Date: 03/31/2021 CLINICAL DATA:  Chest pain and dizziness EXAM: PORTABLE CHEST 1 VIEW COMPARISON:  March 16, 2021 FINDINGS: There remains atelectatic change in the medial right base. Lungs elsewhere are clear. Heart size and pulmonary vascularity are normal. No adenopathy. Central catheter tip is in the superior vena cava. No pneumothorax. No bone lesions. IMPRESSION: Central catheter tip in superior vena cava. No pneumothorax. Medial right base atelectasis. Lungs elsewhere clear. Cardiac silhouette within normal limits. Electronically Signed   By: Lowella Grip III M.D.   On: 03/31/2021 16:35      Assessment & Plan:    Active Problems:   Uncontrolled type 1 diabetes mellitus with hyperglycemia and Diabetic Nephropathy   Diabetic gastroparesis (HCC)   Pyelonephritis   AKI (acute kidney injury) ([place])   1. Pyelonephritis and UTI 1. As seen on CT with corresponding left flank pain 2. Patient has multiple drug allergies, start Cipro IV 3. Urine culture pending 2. Diabetic gastroparesis 1. Presents with abdominal pain, nausea, vomiting that are recurrent symptoms for her 2. Scheduled IV Reglan 3. As needed Zofran 3. Uncontrolled type 1 diabetes mellitus 1. Resume home long-acting insulin 2. Sliding scale coverage 3. Diabetic  diet 4. AKI 1. Very mild with a creatinine bump from 0.89-1.24 2. Likely secondary to diuresis in the setting of uncontrolled hyperglycemia 372 3. 2 L bolus given in ED 4. Trend in the a.m. 5. Tachycardia 1. Has been recurrent.  Previous hospitalizations 2. Blood pressure stable 3. Low suspicion for PE 4. Continue metoprolol 5. Continue to monitor on telemetry   DVT Prophylaxis-   Heparin- SCDs AM Labs Ordered, also please review Full Orders  Family Communication: No family at bedside Code Status: Full Admission status: Observation Time spent in minutes : [place] DO

## 2021-04-01 NOTE — Plan of Care (Signed)
  Problem: Education: Goal: Knowledge of General Education information will improve Description: Including pain rating scale, medication(s)/side effects and non-pharmacologic comfort measures Outcome: Progressing   Problem: Health Behavior/Discharge Planning: Goal: Ability to manage health-related needs will improve Outcome: Not Progressing   

## 2021-04-02 LAB — CBC
HCT: 31.7 % — ABNORMAL LOW (ref 36.0–46.0)
Hemoglobin: 10.4 g/dL — ABNORMAL LOW (ref 12.0–15.0)
MCH: 29.6 pg (ref 26.0–34.0)
MCHC: 32.8 g/dL (ref 30.0–36.0)
MCV: 90.3 fL (ref 80.0–100.0)
Platelets: 243 10*3/uL (ref 150–400)
RBC: 3.51 MIL/uL — ABNORMAL LOW (ref 3.87–5.11)
RDW: 12.9 % (ref 11.5–15.5)
WBC: 5.1 10*3/uL (ref 4.0–10.5)
nRBC: 0 % (ref 0.0–0.2)

## 2021-04-02 LAB — BASIC METABOLIC PANEL
Anion gap: 7 (ref 5–15)
BUN: 13 mg/dL (ref 6–20)
CO2: 26 mmol/L (ref 22–32)
Calcium: 7.9 mg/dL — ABNORMAL LOW (ref 8.9–10.3)
Chloride: 103 mmol/L (ref 98–111)
Creatinine, Ser: 1.26 mg/dL — ABNORMAL HIGH (ref 0.44–1.00)
GFR, Estimated: >60 mL/min (ref >60)
Glucose, Bld: 300 mg/dL — ABNORMAL HIGH (ref 70–99)
Potassium: 3.7 mmol/L (ref 3.5–5.1)
Sodium: 136 mmol/L (ref 135–145)

## 2021-04-02 LAB — URINE CULTURE

## 2021-04-02 LAB — GLUCOSE, CAPILLARY
Glucose-Capillary: 119 mg/dL — ABNORMAL HIGH (ref 70–99)
Glucose-Capillary: 198 mg/dL — ABNORMAL HIGH (ref 70–99)

## 2021-04-02 LAB — MAGNESIUM: Magnesium: 2 mg/dL (ref 1.7–2.4)

## 2021-04-02 MED ORDER — LOPERAMIDE HCL 2 MG PO TABS: 2.0000 mg | ORAL_TABLET | Freq: Four times a day (QID) | ORAL | 0 refills | Status: DC | PRN

## 2021-04-02 MED ORDER — SODIUM CHLORIDE 0.9 % IV SOLN
INTRAVENOUS | Status: DC | PRN
  Administered 2021-04-02: 1000 mL via INTRAVENOUS

## 2021-04-02 MED ORDER — LABETALOL HCL 5 MG/ML IV SOLN: 10.0000 mg | INTRAVENOUS | Status: DC | PRN

## 2021-04-02 MED ORDER — OXYCODONE HCL 5 MG PO TABS: 5.0000 mg | ORAL_TABLET | ORAL | 0 refills | Status: DC | PRN

## 2021-04-02 MED ORDER — INSULIN GLARGINE 100 UNIT/ML ~~LOC~~ SOLN
10.0000 [IU] | Freq: Once | SUBCUTANEOUS | Status: DC
  Filled 2021-04-02: qty 0.1

## 2021-04-02 MED ORDER — CIPROFLOXACIN HCL 500 MG PO TABS: 500.0000 mg | ORAL_TABLET | Freq: Two times a day (BID) | ORAL | 0 refills | Status: AC

## 2021-04-02 MED ORDER — LOPERAMIDE HCL 2 MG PO CAPS
4.0000 mg | ORAL_CAPSULE | Freq: Once | ORAL | Status: AC
  Administered 2021-04-02: 4 mg via ORAL
  Filled 2021-04-02: qty 2

## 2021-04-02 MED ORDER — INSULIN GLARGINE 100 UNIT/ML ~~LOC~~ SOLN
40.0000 [IU] | Freq: Every day | SUBCUTANEOUS | Status: DC
  Filled 2021-04-02: qty 0.4

## 2021-04-02 MED ORDER — HEPARIN SOD (PORK) LOCK FLUSH 100 UNIT/ML IV SOLN
250.0000 [IU] | INTRAVENOUS | Status: AC | PRN
  Administered 2021-04-02: 250 [IU]
  Filled 2021-04-02: qty 5

## 2021-04-02 MED ORDER — CIPROFLOXACIN HCL 500 MG PO TABS: 500.0000 mg | ORAL_TABLET | Freq: Two times a day (BID) | ORAL | 0 refills | Status: DC

## 2021-04-02 MED ORDER — CHLORHEXIDINE GLUCONATE CLOTH 2 % EX PADS
6.0000 | MEDICATED_PAD | Freq: Every day | CUTANEOUS | Status: DC
  Administered 2021-04-02: 6 via TOPICAL

## 2021-04-02 NOTE — Discharge Summary (Signed)
Physician Discharge Summary  Diamond [name] JME:[id number] DOB: 1996/04/28 DOA: 03/31/2021  PCP: Diamond Dryer, PA-C  Admit date: 03/31/2021  Discharge date: 04/02/2021  Admitted From:Home  Disposition:  Home  Recommendations for Outpatient Follow-up:  1. Follow up with PCP in 1-2 weeks 2. Continue on Ancef as prior to finish 6-week IV course of treatment for staph aureus bacteremia and associated discitis 3. Continue on ciprofloxacin as prescribed for UTI/pyelonephritis for 8 more days as ordered to complete 10 day course of treatment 4. Continue other home medications as prior  Home Health: None  Equipment/Devices: None  Discharge Condition:Stable  CODE STATUS: Full  Diet recommendation: Heart Healthy/carb modified  Brief/Interim Summary: [name] a24 y.o.female,with history of type 2 diabetes, stroke, hypertension, GERD, psychiatric disorder, and more presents the ED with a chief complaint of chest pain. Patient did report to the ED yesterday with nausea and diarrhea.  Patient was admitted with pyelonephritis/UTI and has been started on IV ciprofloxacin.  She is noted to have history of diabetic gastroparesis and was noted to have some associated nausea and vomiting, but this has improved considerably.  She is now tolerating her breakfast and is able to tolerate oral medications and therefore will be switched over to oral ciprofloxacin at discharge.  She will need a total 10-day course of treatment given the concern for pyelonephritis.  She will continue her usual home medications as otherwise prescribed.  No other acute events noted throughout the course of this brief admission.  Discharge Diagnoses:  Active Problems:   Uncontrolled type 1 diabetes mellitus with hyperglycemia and Diabetic Nephropathy   Diabetic gastroparesis ([place])   Pyelonephritis   AKI (acute kidney injury) ([place])  Principal discharge diagnosis: Pyelonephritis/UTI in the setting of recent  kidney biopsy for proteinuria.  Discharge Instructions  Discharge Instructions    Diet - low sodium heart healthy   Complete by: As directed    Increase activity slowly   Complete by: As directed      Allergies as of 04/02/2021      Reactions   Amoxicillin Hives   Did it involve swelling of the face/tongue/throat, SOB, or low BP? no  Did it involve sudden or severe rash/hives, skin peeling, or any reaction on the inside of your mouth or nose? no Did you need to seek medical attention at a hospital or doctor's office? no When did it last happen?unk If all above answers are "NO", may proceed with cephalosporin use.   Dexamethasone Hives, Itching   Doxycycline Itching, Swelling   Penicillins Hives, Itching   Did it involve swelling of the face/tongue/throat, SOB, or low BP? no  Did it involve sudden or severe rash/hives, skin peeling, or any reaction on the inside of your mouth or nose? no Did you need to seek medical attention at a hospital or doctor's office? no When did it last happen?unk If all above answers are "NO", may proceed with cephalosporin use. Did it involve swelling of the face/tongue/throat, SOB, or low BP? no  Did it involve sudden or severe rash/hives, skin peeling, or any reac   Ceftriaxone Sodium In Dextrose Itching   Augmentin [amoxicillin-pot Clavulanate] Other (See Comments)   Reaction unknown   Clavulanic Acid    Adhesive [tape] Itching, Rash   Latex Itching, Rash   Other Itching, Rash   Pineapple Rash   Rash on tongue and throat      Medication List    TAKE these medications   acetaminophen 500 MG tablet Commonly known as:  TYLENOL Take by mouth.   albuterol 108 (90 Base) MCG/ACT inhaler Commonly known as: VENTOLIN HFA Inhale 2 puffs into the lungs every 6 (six) hours as needed for wheezing or shortness of breath. For shortness of breath   APIDRA Coleman Inject 10-16 Units into the skin 3 (three) times daily before meals. ssi   ceFAZolin   IVPB Commonly known as: ANCEF Inject 2 g into the vein every 8 (eight) hours. Indication:  Bacteremia, possible discitis First Dose: No Last Day of Therapy:  04/17/21 Labs - Once weekly:  CBC/D and BMP, Labs - Every other week:  ESR and CRP Method of administration: IV Push Method of administration may be changed at the discretion of home infusion pharmacist based upon assessment of the patient and/or caregiver's ability to self-administer the medication ordered.   ciprofloxacin 500 MG tablet Commonly known as: Cipro Take 1 tablet (500 mg total) by mouth 2 (two) times daily for 8 days.   citalopram 10 MG tablet Commonly known as: CELEXA Take 10 mg by mouth daily.   diphenhydrAMINE 12.5 MG/5ML elixir Commonly known as: BENADRYL Take 10 mLs (25 mg total) by mouth every 6 (six) hours as needed for itching, allergies or sleep.   furosemide 20 MG tablet Commonly known as: LASIX Take 20 mg by mouth daily.   gabapentin 100 MG capsule Commonly known as: NEURONTIN Take 1 capsule (100 mg total) by mouth 2 (two) times daily.   hydrALAZINE 25 MG tablet Commonly known as: APRESOLINE Take 1 tablet (25 mg total) by mouth 2 (two) times daily.   hydrochlorothiazide 25 MG tablet Commonly known as: HYDRODIURIL Take 25 mg by mouth daily.   insulin glargine 100 UNIT/ML injection Commonly known as: LANTUS Inject 40 Units into the skin at bedtime.   isosorbide mononitrate 30 MG 24 hr tablet Commonly known as: IMDUR Take 0.5 tablets (15 mg total) by mouth daily.   metoCLOPramide 5 MG tablet Commonly known as: Reglan Take 1 tablet (5 mg total) by mouth every 8 (eight) hours as needed for refractory nausea / vomiting (refractory symptoms).   metoprolol tartrate 50 MG tablet Commonly known as: LOPRESSOR Take 1 tablet (50 mg total) by mouth 2 (two) times daily.   omeprazole 40 MG capsule Commonly known as: PRILOSEC Take 1 capsule (40 mg total) by mouth in the morning and at bedtime.    ondansetron 8 MG disintegrating tablet Commonly known as: Zofran ODT Take 1 tablet (8 mg total) by mouth every 8 (eight) hours as needed for nausea or vomiting.   oxyCODONE 5 MG immediate release tablet Commonly known as: Oxy IR/ROXICODONE Take 1 tablet (5 mg total) by mouth every 4 (four) hours as needed for moderate pain, severe pain or breakthrough pain.   potassium chloride 10 MEQ tablet Commonly known as: KLOR-CON Take 10 mEq by mouth daily.   traZODone 50 MG tablet Commonly known as: DESYREL Take 1 tablet (50 mg total) by mouth at bedtime.       Follow-up Information    Diamond Dryer, PA-C. Schedule an appointment as soon as possible for a visit in 1 week(s).   Specialty: Physician Assistant Contact information: [place] [address] [phone]              Allergies  Allergen Reactions  . Amoxicillin Hives    Did it involve swelling of the face/tongue/throat, SOB, or low BP? no  Did it involve sudden or severe rash/hives, skin peeling, or any reaction on the  inside of your mouth or nose? no Did you need to seek medical attention at a hospital or doctor's office? no When did it last happen?unk If all above answers are "NO", may proceed with cephalosporin use.  Marland Kitchen Dexamethasone Hives and Itching  . Doxycycline Itching and Swelling  . Penicillins Hives and Itching    Did it involve swelling of the face/tongue/throat, SOB, or low BP? no  Did it involve sudden or severe rash/hives, skin peeling, or any reaction on the inside of your mouth or nose? no Did you need to seek medical attention at a hospital or doctor's office? no When did it last happen?unk If all above answers are "NO", may proceed with cephalosporin use. Did it involve swelling of the face/tongue/throat, SOB, or low BP? no  Did it involve sudden or severe rash/hives, skin peeling, or any reac  . Ceftriaxone Sodium In Dextrose Itching  . Augmentin [Amoxicillin-Pot  Clavulanate] Other (See Comments)    Reaction unknown  . Clavulanic Acid   . Adhesive [Tape] Itching and Rash  . Latex Itching and Rash  . Other Itching and Rash  . Pineapple Rash    Rash on tongue and throat    Consultations:  None   Procedures/Studies: DG Chest 1 View  Result Date: 03/16/2021 CLINICAL DATA:  Right-sided PICC placement, abdominal pain EXAM: CHEST  1 VIEW COMPARISON:  03/04/2021 FINDINGS: Single frontal view of the chest demonstrates right-sided PICC tip overlying superior vena cava. The cardiac silhouette is unremarkable. Improved aeration at the lung bases, with minimal residual bibasilar consolidation. No effusion or pneumothorax. IMPRESSION: 1. Unremarkable right-sided PICC. 2. Improving bibasilar consolidation which may reflect resolving pneumonia or atelectasis. Electronically Signed   By: Randa Ngo M.D.   On: 03/16/2021 15:30   DG Chest 2 View  Result Date: 03/04/2021 CLINICAL DATA:  Fever and shortness of breath for several days. EXAM: CHEST - 2 VIEW COMPARISON:  03/02/2021 FINDINGS: Cardiomediastinal silhouette and pulmonary vasculature are within normal limits. Interval development of of bibasilar airspace opacities suspicious for pneumonia. IMPRESSION: Findings suspicious for multifocal pneumonia. Electronically Signed   By: Miachel Roux M.D.   On: 03/04/2021 10:28   MR Lumbar Spine W Wo Contrast  Result Date: 03/06/2021 CLINICAL DATA:  Staph bacteremia. Low back pain. Bilateral leg weakness. EXAM: MRI LUMBAR SPINE WITHOUT AND WITH CONTRAST TECHNIQUE: Multiplanar and multiecho pulse sequences of the lumbar spine were obtained without and with intravenous contrast. CONTRAST:  41m GADAVIST GADOBUTROL 1 MMOL/ML IV SOLN COMPARISON:  None. FINDINGS: Segmentation:  5 lumbar type vertebral bodies. Alignment:  Normal Vertebrae:  Normal.  No evidence of bone or joint infection. Conus medullaris and cauda equina: Conus extends to the L1 level. Conus and cauda equina  appear normal. Paraspinal and other soft tissues: Deep para spinous soft tissues are normal. No evidence of epidural space infection. Nonspecific edema pattern/fluid accumulation within the fatty tissues of the back, superficial to the paraspinous muscles, extending from T12-S1. This is more pronounced than usually seen with simple dependent positioning and the patient could possibly have a fascial plane infection in the back. Disc levels: Mild bulging of the discs at L3-4, L4-5 and L5-S1 but no apparent compressive stenosis. IMPRESSION: 1. No evidence of epidural space infection. No evidence of spinal infection. 2. Nonspecific edema pattern within the fatty tissues of the back throughout the lumbar region, superficial to the paraspinous muscles. This is more pronounced than usually seen with simple dependent positioning and the patient could possibly have a  fascial plane infection in the back. Electronically Signed   By: Nelson Chimes M.D.   On: 03/06/2021 14:33   NM Pulmonary Perfusion  Result Date: 03/04/2021 CLINICAL DATA:  Positive D-dimer. Shortness of breath and chest pain several months. Low to intermediate probability for pulmonary embolism. EXAM: NUCLEAR MEDICINE PERFUSION LUNG SCAN TECHNIQUE: Perfusion images were obtained in multiple projections after intravenous injection of radiopharmaceutical. Ventilation scans intentionally deferred if perfusion scan and chest x-ray adequate for interpretation during COVID 19 epidemic. RADIOPHARMACEUTICALS:  4.4 mCi Tc-69mMAA IV COMPARISON:  Two view chest 03/04/2021 FINDINGS: Examination demonstrates normal perfusion images without focal peripheral wedge-shaped perfusion defect. IMPRESSION: Normal perfusion lung scan without evidence of pulmonary emboli. Electronically Signed   By: DMarin OlpM.D.   On: 03/04/2021 11:06   UKoreaAbdomen Complete  Result Date: 03/18/2021 CLINICAL DATA:  Nausea and vomiting for 1 week. Patient had ultrasound-guided core biopsy  of the LEFT kidney performed on 03/07/2021 for history of nephrotic syndrome. EXAM: ABDOMEN ULTRASOUND COMPLETE COMPARISON:  CT of the abdomen and pelvis on 01/17/2021, abdominal ultrasound 10/18/2020 BB FINDINGS: Gallbladder: Gallbladder wall is 1.9 millimeters in thickness. No stones, pericholecystic fluid, or Murphy's sign. Note is made of a 5 millimeter polyp along the posterior wall of the gallbladder, not seen on prior study. Common bile duct: Diameter: 4.4 millimeters Liver: No focal lesion identified. Within normal limits in parenchymal echogenicity. Portal vein is patent on color Doppler imaging with normal direction of blood flow towards the liver. IVC: No abnormality visualized. Pancreas: Visualized portion unremarkable. Spleen: Normal appearance of the spleen. Accessory spleen along the inferior tip measures 1.8 centimeters. Right Kidney: Length: 10.8 centimeters. Echogenicity within normal limits. No mass or hydronephrosis visualized. Left Kidney: Length: 11.3 centimeters. Mildly heterogeneous subcapsular collection along the anterolateral aspect of the LEFT kidney measures 5 x 0.6 centimeters and likely represents hematoma/seroma from recent ultrasound-guided core biopsy. The appearance would be atypical for abscess. There is no evidence for inflammatory changes in the surrounding fat. No suspicious mass or hydronephrosis. Abdominal aorta: No aneurysm visualized. Other findings: Small bilateral pleural effusions. Question trace ascites. IMPRESSION: 1. No evidence for acute cholecystitis. Small gallbladder polyp measures 5 millimeters. 2. Subcapsular collection along the anterolateral aspect of the LEFT kidney following ultrasound-guided core biopsy on 03/07/2021. Favor subcapsular hematoma/seroma. There is no obvious abscess. However, consider further evaluation with CT of the abdomen and pelvis for further characterization. Intravenous contrast is recommended unless it is contraindicated.  Electronically Signed   By: ENolon NationsM.D.   On: 03/18/2021 11:31   CT ABDOMEN PELVIS W CONTRAST  Result Date: 03/31/2021 CLINICAL DATA:  Abdominal abscess or infection suspected. Abdominal pain, nausea and vomiting. EXAM: CT ABDOMEN AND PELVIS WITH CONTRAST TECHNIQUE: Multidetector CT imaging of the abdomen and pelvis was performed using the standard protocol following bolus administration of intravenous contrast. CONTRAST:  752mOMNIPAQUE IOHEXOL 300 MG/ML  SOLN COMPARISON:  Ultrasound 2 weeks ago. FINDINGS: Lower chest: Minimal basilar atelectasis. Hepatobiliary: No liver parenchymal finding. No calcified gallstones. Pancreas: Pancreas appears normal. Spleen: Normal Adrenals/Urinary Tract: Adrenal glands are normal. Right kidney shows an area of low-density in the lower pole consistent with focal pyelonephritis. No obstruction. Small subcapsular collection of the left kidney of maximal thickness measuring 8 mm, similar to the previous ultrasound. No renal obstruction. Bladder is full. Stomach/Bowel: No evidence of bowel obstruction. The appendix appears normal. There is a small amount of ascites. No focal finding. Vascular/Lymphatic: Normal Reproductive: Normal Other: No free air.  No hernia. Musculoskeletal: Normal IMPRESSION: 1. Abnormal low density in the lower pole the right kidney probably indicating focal pyelonephritis. No evidence of renal abscess. 2. Small amount of ascites. No focal bowel finding. 3. Small subcapsular collection of the left kidney subsequent to previous renal biopsy, similar to the previous ultrasound. 4. Distended bladder. Electronically Signed   By: Nelson Chimes M.D.   On: 03/31/2021 18:03   US Venous Img Lower Bilateral (DVT)  Result Date: 03/04/2021 CLINICAL DATA:  Bilateral lower extremity edema.  Evaluate for DVT. EXAM: BILATERAL LOWER EXTREMITY VENOUS DOPPLER ULTRASOUND TECHNIQUE: Gray-scale sonography with graded compression, as well as color Doppler and duplex  ultrasound were performed to evaluate the lower extremity deep venous systems from the level of the common femoral vein and including the common femoral, femoral, profunda femoral, popliteal and calf veins including the posterior tibial, peroneal and gastrocnemius veins when visible. The superficial great saphenous vein was also interrogated. Spectral Doppler was utilized to evaluate flow at rest and with distal augmentation maneuvers in the common femoral, femoral and popliteal veins. COMPARISON:  Nuclear medicine V/Q scan-03/04/2021 Bilateral lower extremity venous Doppler ultrasound-01/24/2021 (negative). FINDINGS: RIGHT LOWER EXTREMITY Common Femoral Vein: No evidence of thrombus. Normal compressibility, respiratory phasicity and response to augmentation. Saphenofemoral Junction: No evidence of thrombus. Normal compressibility and flow on color Doppler imaging. Profunda Femoral Vein: No evidence of thrombus. Normal compressibility and flow on color Doppler imaging. Femoral Vein: No evidence of thrombus. Normal compressibility, respiratory phasicity and response to augmentation. Popliteal Vein: No evidence of thrombus. Normal compressibility, respiratory phasicity and response to augmentation. Calf Veins: No evidence of thrombus. Normal compressibility and flow on color Doppler imaging. Superficial Great Saphenous Vein: No evidence of thrombus. Normal compressibility. Venous Reflux:  None. Other Findings: There is a moderate amount of subcutaneous edema at the level the right calf. Redemonstrated prominent right inguinal lymph nodes with index right inguinal lymph node measuring 0.8 cm in greatest short axis diameter (image 2), similar to the 01/24/2021 examination, presumably reactive in etiology. LEFT LOWER EXTREMITY Common Femoral Vein: No evidence of thrombus. Normal compressibility, respiratory phasicity and response to augmentation. Saphenofemoral Junction: No evidence of thrombus. Normal compressibility  and flow on color Doppler imaging. Profunda Femoral Vein: No evidence of thrombus. Normal compressibility and flow on color Doppler imaging. Femoral Vein: No evidence of thrombus. Normal compressibility, respiratory phasicity and response to augmentation. Popliteal Vein: No evidence of thrombus. Normal compressibility, respiratory phasicity and response to augmentation. Calf Veins: No evidence of thrombus. Normal compressibility and flow on color Doppler imaging. Superficial Great Saphenous Vein: No evidence of thrombus. Normal compressibility. Venous Reflux:  None. Other Findings: There is a minimal to moderate amount of subcutaneous edema at the level of the left calf. IMPRESSION: No evidence of DVT within either lower extremity. Electronically Signed   By: Sandi Mariscal M.D.   On: 03/04/2021 11:56   DG Chest Port 1 View  Result Date: 03/31/2021 CLINICAL DATA:  Chest pain and dizziness EXAM: PORTABLE CHEST 1 VIEW COMPARISON:  March 16, 2021 FINDINGS: There remains atelectatic change in the medial right base. Lungs elsewhere are clear. Heart size and pulmonary vascularity are normal. No adenopathy. Central catheter tip is in the superior vena cava. No pneumothorax. No bone lesions. IMPRESSION: Central catheter tip in superior vena cava. No pneumothorax. Medial right base atelectasis. Lungs elsewhere clear. Cardiac silhouette within normal limits. Electronically Signed   By: Lowella Grip III M.D.   On: 03/31/2021 16:35   ECHOCARDIOGRAM COMPLETE  Result Date: 03/06/2021    ECHOCARDIOGRAM REPORT   Patient Name:   SYNIA DOUGLASS Date of Exam: 03/06/2021 Medical Rec #:  [id number]        Height:       59.0 in Accession #:    [id number]       Weight:       140.7 lb Date of Birth:  03-28-1996        BSA:          1.588 m Patient Age:    24 years         BP:           134/96 mmHg Patient Gender: F                HR:           96 bpm. Exam Location:  [facility] Procedure: 2D Echo Indications:    Bacteremia R78.81   History:        Patient has prior history of Echocardiogram examinations, most                 recent 10/19/2020. Risk Factors:Non-Smoker, Diabetes and                 Hypertension. GERD, Pneumonia.  Sonographer:    Leavy Cella RDCS (AE) Referring Phys: [place]  1. Moderate LVH, myocardium with somewhat speckled appearance. Mild valve thickening. Trivial pericardial effusion. Findings could suggest infiltrative cardiac disease. Consider outpatient cardiac MRI for further evaluation. . Left ventricular ejection fraction, by estimation, is 65 to 70%. The left ventricle has normal function. The left ventricle has no regional wall motion abnormalities. There is moderate left ventricular hypertrophy. Left ventricular diastolic parameters were normal.  2. Right ventricular systolic function is normal. The right ventricular size is normal.  3. The pericardial effusion is circumferential.  4. The mitral valve is abnormal. Trivial mitral valve regurgitation. No evidence of mitral stenosis.  5. The aortic valve is tricuspid. There is mild thickening of the aortic valve. Aortic valve regurgitation is not visualized. No aortic stenosis is present.  6. The inferior vena cava is normal in size with greater than 50% respiratory variability, suggesting right atrial pressure of 3 mmHg. FINDINGS  Left Ventricle: Moderate LVH, myocardium with somewhat speckled appearance. Mild valve thickening. Trivial pericardial effusion. Findings could suggest infiltrative cardiac disease. Consider outpatient cardiac MRI for further evaluation. Left ventricular ejection fraction, by estimation, is 65 to 70%. The left ventricle has normal function. The left ventricle has no regional wall motion abnormalities. The left ventricular internal cavity size was normal in size. There is moderate left ventricular hypertrophy. Left ventricular diastolic parameters were normal. Right Ventricle: The right ventricular size is  normal. No increase in right ventricular wall thickness. Right ventricular systolic function is normal. Left Atrium: Left atrial size was normal in size. Right Atrium: Right atrial size was normal in size. Pericardium: Trivial pericardial effusion is present. The pericardial effusion is circumferential. Mitral Valve: The mitral valve is abnormal. There is mild thickening of the mitral valve leaflet(s). Trivial mitral valve regurgitation. No evidence of mitral valve stenosis. Tricuspid Valve: The tricuspid valve is normal in structure. Tricuspid valve regurgitation is not demonstrated. No evidence of tricuspid stenosis. Aortic Valve: The aortic valve is tricuspid. There is mild thickening of the aortic valve. Aortic valve regurgitation is not visualized. No aortic stenosis is present. Aortic valve mean gradient measures 4.8 mmHg. Aortic valve peak gradient measures  8.7 mmHg. Aortic valve area, by VTI measures 2.07 cm. Pulmonic Valve: The pulmonic valve was not well visualized. Pulmonic valve regurgitation is not visualized. No evidence of pulmonic stenosis. Aorta: The aortic root is normal in size and structure. Pulmonary Artery: Indeterminant PASP, inadequate TR jet. Venous: The inferior vena cava is normal in size with greater than 50% respiratory variability, suggesting right atrial pressure of 3 mmHg. IAS/Shunts: No atrial level shunt detected by color flow Doppler.  LEFT VENTRICLE PLAX 2D LVIDd:         3.42 cm  Diastology LVIDs:         1.76 cm  LV e' medial:    12.50 cm/s LV PW:         1.40 cm  LV E/e' medial:  5.9 LV IVS:        1.35 cm  LV e' lateral:   13.80 cm/s LVOT diam:     1.70 cm  LV E/e' lateral: 5.4 LV SV:         56 LV SV Index:   35 LVOT Area:     2.27 cm  RIGHT VENTRICLE RV S prime:     19.20 cm/s TAPSE (M-mode): 2.9 cm LEFT ATRIUM             Index       RIGHT ATRIUM           Index LA diam:        3.20 cm 2.02 cm/m  RA Area:     10.40 cm LA Vol (A2C):   40.0 ml 25.19 ml/m RA Volume:    21.10 ml  13.29 ml/m LA Vol (A4C):   27.6 ml 17.38 ml/m LA Biplane Vol: 33.0 ml 20.78 ml/m  AORTIC VALVE AV Area (Vmax):    1.99 cm AV Area (Vmean):   2.07 cm AV Area (VTI):     2.07 cm AV Vmax:           147.27 cm/s AV Vmean:          104.500 cm/s AV VTI:            0.270 m AV Peak Grad:      8.7 mmHg AV Mean Grad:      4.8 mmHg LVOT Vmax:         129.02 cm/s LVOT Vmean:        95.318 cm/s LVOT VTI:          0.247 m LVOT/AV VTI ratio: 0.91  AORTA Ao Root diam: 2.00 cm MITRAL VALVE MV Area (PHT): 3.97 cm    SHUNTS MV Decel Time: 191 msec    Systemic VTI:  0.25 m MV E velocity: 74.00 cm/s  Systemic Diam: 1.70 cm MV A velocity: 71.30 cm/s MV E/A ratio:  1.04 [name] MD Electronically signed by [name] MD Signature Date/Time: 03/06/2021/10:32:44 AM    Final    ECHO TEE  Result Date: 03/09/2021    TRANSESOPHOGEAL ECHO REPORT   Patient Name:   [name] Date of Exam: 03/09/2021 Medical Rec #:  [id number]        Height:       59.0 in Accession #:    [id number]       Weight:       134.0 lb Date of Birth:  08/14/1996        BSA:          1.556 m Patient Age:    62 years  BP:           235/107 mmHg Patient Gender: F                HR:           104 bpm. Exam Location:  [facility] Procedure: 2D Echo Indications:    Bacteremia 790.7 / R78.81  History:        Patient has prior history of Echocardiogram examinations, most                 recent 03/06/2021. Risk Factors:Non-Smoker, Hypertension and                 Diabetes. Pneumonia.  Sonographer:    Leavy Cella RDCS (AE) Referring Phys: [id number] [place]: The transesophogeal probe was passed without difficulty through the esophogus of the patient. Sedation performed by different physician. The patient developed no complications during the procedure. IMPRESSIONS  1. Left ventricular ejection fraction, by estimation, is 60 to 65%. The left ventricle has normal function.  2. Right ventricular systolic function is normal. The  right ventricular size is normal.  3. No left atrial/left atrial appendage thrombus was detected. The LAA emptying velocity was 50 cm/s.  4. The mitral valve is normal in structure. No evidence of mitral valve regurgitation. No evidence of mitral stenosis.  5. The aortic valve is tricuspid. Aortic valve regurgitation is not visualized. No aortic stenosis is present.  6. The inferior vena cava is normal in size with greater than 50% respiratory variability, suggesting right atrial pressure of 3 mmHg. Conclusion(s)/Recommendation(s): No evidence of vegetation/infective endocarditis on this transesophageal echocardiogram. FINDINGS  Left Ventricle: Left ventricular ejection fraction, by estimation, is 60 to 65%. The left ventricle has normal function. The left ventricular internal cavity size was normal in size. Right Ventricle: The right ventricular size is normal. No increase in right ventricular wall thickness. Right ventricular systolic function is normal. Left Atrium: Left atrial size was normal in size. No left atrial/left atrial appendage thrombus was detected. The LAA emptying velocity was 50 cm/s. Right Atrium: Right atrial size was normal in size. Pericardium: There is no evidence of pericardial effusion. Mitral Valve: The mitral valve is normal in structure. No evidence of mitral valve regurgitation. No evidence of mitral valve stenosis. Tricuspid Valve: The tricuspid valve is normal in structure. Tricuspid valve regurgitation is mild . No evidence of tricuspid stenosis. Aortic Valve: The aortic valve is tricuspid. Aortic valve regurgitation is not visualized. No aortic stenosis is present. Pulmonic Valve: The pulmonic valve was normal in structure. Pulmonic valve regurgitation is not visualized. No evidence of pulmonic stenosis. Aorta: The aortic root is normal in size and structure. Venous: The inferior vena cava is normal in size with greater than 50% respiratory variability, suggesting right atrial  pressure of 3 mmHg. IAS/Shunts: No atrial level shunt detected by color flow Doppler. [name] MD Electronically signed by [name] MD Signature Date/Time: 03/09/2021/12:13:16 PM    Final    US BIOPSY (KIDNEY)  Result Date: 03/07/2021 INDICATION: 25 year old female with a history of nephrotic syndrome EXAM: IMAGE GUIDED MEDICAL RENAL BIOPSY MEDICATIONS: None. ANESTHESIA/SEDATION: Moderate (conscious) sedation was employed during this procedure. A total of Versed 2.0 mg and Fentanyl 50 mcg was administered intravenously. Moderate Sedation Time: 13 minutes. The patient's level of consciousness and vital signs were monitored continuously by radiology nursing throughout the procedure under my direct supervision. FLUOROSCOPY TIME:  Ultrasound COMPLICATIONS: None PROCEDURE: Informed written consent was obtained from  the patient after a thorough discussion of the procedural risks, benefits and alternatives. All questions were addressed. Maximal Sterile Barrier Technique was utilized including caps, mask, sterile gowns, sterile gloves, sterile drape, hand hygiene and skin antiseptic. A timeout was performed prior to the initiation of the procedure. Patient was positioned prone position on the gantry table. Images were stored sent to PACs. Once the patient is prepped and draped in the usual sterile fashion, the skin and subcutaneous tissues overlying the left kidney were generously infiltrated 1% lidocaine for local anesthesia. Using ultrasound guidance, a 15 gauge guide needle was advanced into the lower cortex of the left kidney. Once we confirmed location of the needle tip, 2 separate 16 gauge core biopsy were achieved. Two Gel-Foam pledgets were infused with a small amount of saline. The needle was removed. Final images were stored. The patient tolerated the procedure well and remained hemodynamically stable throughout. No complications were encountered and no significant blood loss encountered.  IMPRESSION: Status post image guided medical renal biopsy of the left kidney. Signed, Dulcy Fanny. Dellia Nims, RPVI [facility] Radiology Electronically Signed   By: Corrie Mckusick D.O.   On: 03/07/2021 12:38   [place] EKG SITE RITE  Result Date: 03/09/2021 If Site Rite image not attached, placement could not be confirmed due to current cardiac rhythm.    Discharge Exam: Vitals:   04/02/21 0612 04/02/21 1038  BP: (!) 159/115 98/72  Pulse: (!) 103 98  Resp: 20 18  Temp: 98.8 F (37.1 C) 98.4 F (36.9 C)  SpO2: 99% 98%   Vitals:   04/01/21 1445 04/01/21 2126 04/02/21 0612 04/02/21 1038  BP: 124/90 (!) 162/108 (!) 159/115 98/72  Pulse: (!) 101 (!) 108 (!) 103 98  Resp: 16 16 20 18   Temp: 97.8 F (36.6 C) 99.8 F (37.7 C) 98.8 F (37.1 C) 98.4 F (36.9 C)  TempSrc: Oral Oral Oral Oral  SpO2: 99% 99% 99% 98%  Weight:      Height:        General: Pt is alert, awake, not in acute distress Cardiovascular: RRR, S1/S2 +, no rubs, no gallops Respiratory: CTA bilaterally, no wheezing, no rhonchi Abdominal: Soft, NT, ND, bowel sounds + Extremities: no edema, no cyanosis    The results of significant diagnostics from this hospitalization (including imaging, microbiology, ancillary and laboratory) are listed below for reference.     Microbiology: Recent Results (from the past 240 hour(s))  Resp Panel by RT-PCR (Flu A&B, Covid) Nasopharyngeal Swab     Status: None   Collection Time: 03/31/21  4:14 AM   Specimen: Nasopharyngeal Swab; Nasopharyngeal(NP) swabs in vial transport medium  Result Value Ref Range Status   SARS Coronavirus 2 by RT PCR NEGATIVE NEGATIVE Final    Comment: (NOTE) SARS-CoV-2 target nucleic acids are NOT DETECTED.  The SARS-CoV-2 RNA is generally detectable in upper respiratory specimens during the acute phase of infection. The lowest concentration of SARS-CoV-2 viral copies this assay can detect is 138 copies/mL. A  negative result does not preclude SARS-Cov-2 infection and should not be used as the sole basis for treatment or other patient management decisions. A negative result may occur with  improper specimen collection/handling, submission of specimen other than nasopharyngeal swab, presence of viral mutation(s) within the areas targeted by this assay, and inadequate number of viral copies(<138 copies/mL). A negative result must be combined with clinical observations, patient history, and epidemiological information. The expected result is Negative.  Fact Sheet  for Patients:  EntrepreneurPulse.com.au  Fact Sheet for Healthcare Providers:  IncredibleEmployment.be  This test is no t yet approved or cleared by the [place] FDA and  has been authorized for detection and/or diagnosis of SARS-CoV-2 by FDA under an Emergency Use Authorization (EUA). This EUA will remain  in effect (meaning this test can be used) for the duration of the COVID-19 declaration under Section 564(b)(1) of the Act, 21 U.S.C.section 360bbb-3(b)(1), unless the authorization is terminated  or revoked sooner.       Influenza A by PCR NEGATIVE NEGATIVE Final   Influenza B by PCR NEGATIVE NEGATIVE Final    Comment: (NOTE) The Xpert Xpress SARS-CoV-2/FLU/RSV plus assay is intended as an aid in the diagnosis of influenza from Nasopharyngeal swab specimens and should not be used as a sole basis for treatment. Nasal washings and aspirates are unacceptable for Xpert Xpress SARS-CoV-2/FLU/RSV testing.  Fact Sheet for Patients: EntrepreneurPulse.com.au  Fact Sheet for Healthcare Providers: IncredibleEmployment.be  This test is not yet approved or cleared by the [place] FDA and has been authorized for detection and/or diagnosis of SARS-CoV-2 by FDA under an Emergency Use Authorization (EUA). This EUA will remain in effect (meaning this test can  be used) for the duration of the COVID-19 declaration under Section 564(b)(1) of the Act, 21 U.S.C. section 360bbb-3(b)(1), unless the authorization is terminated or revoked.  Performed at [facility], [address] [place], [place] [address]   Urine Culture     Status: Abnormal   Collection Time: 03/31/21  7:05 PM   Specimen: Urine, Clean Catch  Result Value Ref Range Status   Specimen Description   Final    URINE, CLEAN CATCH Performed at [facility], [address] [place], [place] [address]    Special Requests   Final    NONE Performed at [facility], [address], [place], [place] [address]    Culture MULTIPLE SPECIES PRESENT, SUGGEST RECOLLECTION (A)  Final   Report Status 04/02/2021 FINAL  Final  SARS CORONAVIRUS 2 (TAT 6-24 HRS) Nasopharyngeal Nasopharyngeal Swab     Status: None   Collection Time: 03/31/21  8:22 PM   Specimen: Nasopharyngeal Swab  Result Value Ref Range Status   SARS Coronavirus 2 NEGATIVE NEGATIVE Final    Comment: (NOTE) SARS-CoV-2 target nucleic acids are NOT DETECTED.  The SARS-CoV-2 RNA is generally detectable in upper and lower respiratory specimens during the acute phase of infection. Negative results do not preclude SARS-CoV-2 infection, do not rule out co-infections with other pathogens, and should not be used as the sole basis for treatment or other patient management decisions. Negative results must be combined with clinical observations, patient history, and epidemiological information. The expected result is Negative.  Fact Sheet for Patients: SugarRoll.be  Fact Sheet for Healthcare Providers: https://www.woods-mathews.com/  This test is not yet approved or cleared by the [place] FDA and  has been authorized for detection and/or diagnosis of SARS-CoV-2 by FDA under an Emergency Use Authorization (EUA). This EUA will remain  in effect (meaning this test can be used) for the duration  of the COVID-19 declaration under Se ction 564(b)(1) of the Act, 21 U.S.C. section 360bbb-3(b)(1), unless the authorization is terminated or revoked sooner.  Performed at [facility], [place] [address], [place], [place] [address]      Labs: BNP (last 3 results) Recent Labs    01/23/21 1806 01/30/21 1007 03/02/21 1419  BNP 44.0 76.0 61.6   Basic Metabolic Panel: Recent Labs  Lab 03/30/21 2239 03/31/21 1614 04/01/21 0527 04/02/21 0503  NA 138 129* 135 136  K 3.4* 3.5 3.2* 3.7  CL 101 96* 103 103  CO2 29 24 28 26   GLUCOSE 156* 372* 121* 300*  BUN 11 11 9 13   CREATININE 0.89 1.24* 1.09* 1.26*  CALCIUM 8.6* 7.9* 8.0* 7.9*  MG  --   --  1.5* 2.0   Liver Function Tests: Recent Labs  Lab 03/30/21 2239 03/31/21 1614 04/01/21 0527  AST 12* 10* 11*  ALT 8 8 8   ALKPHOS 112 107 86  BILITOT 0.6 0.8 0.6  PROT 6.7 6.3* 5.4*  ALBUMIN 2.1* 2.0* 1.6*   Recent Labs  Lab 03/31/21 1614  LIPASE 20   No results for input(s): AMMONIA in the last 168 hours. CBC: Recent Labs  Lab 03/30/21 2239 03/31/21 1614 04/01/21 0527 04/02/21 0503  WBC 6.8 7.3 4.5 5.1  NEUTROABS 4.1 6.0  --   --   HGB 11.3* 10.8* 10.0* 10.4*  HCT 34.1* 33.3* 30.7* 31.7*  MCV 89.0 90.5 89.2 90.3  PLT 293 232 228 243   Cardiac Enzymes: No results for input(s): CKTOTAL, CKMB, CKMBINDEX, TROPONINI in the last 168 hours. BNP: Invalid input(s): POCBNP CBG: Recent Labs  Lab 04/01/21 1009 04/01/21 1240 04/01/21 1708 04/01/21 2124 04/02/21 0809  GLUCAP 126* 160* 235* 208* 119*   D-Dimer No results for input(s): DDIMER in the last 72 hours. Hgb A1c No results for input(s): HGBA1C in the last 72 hours. Lipid Profile No results for input(s): CHOL, HDL, LDLCALC, TRIG, CHOLHDL, LDLDIRECT in the last 72 hours. Thyroid function studies No results for input(s): TSH, T4TOTAL, T3FREE, THYROIDAB in the last 72 hours.  Invalid input(s): FREET3 Anemia work up No results for input(s): VITAMINB12,  FOLATE, FERRITIN, TIBC, IRON, RETICCTPCT in the last 72 hours. Urinalysis    Component Value Date/Time   COLORURINE STRAW (A) 03/31/2021 1905   APPEARANCEUR HAZY (A) 03/31/2021 1905   APPEARANCEUR Clear 11/15/2020 1531   LABSPEC 1.011 03/31/2021 1905   PHURINE 7.0 03/31/2021 1905   GLUCOSEU >=500 (A) 03/31/2021 1905   HGBUR NEGATIVE 03/31/2021 1905   BILIRUBINUR NEGATIVE 03/31/2021 1905   BILIRUBINUR NEG 12/05/2020 1353   BILIRUBINUR Negative 11/15/2020 [place] 03/31/2021 1905   PROTEINUR >=300 (A) 03/31/2021 1905   UROBILINOGEN 0.2 12/05/2020 1353   UROBILINOGEN 0.2 09/13/2012 2014   NITRITE NEGATIVE 03/31/2021 1905   LEUKOCYTESUR TRACE (A) 03/31/2021 1905   Sepsis Labs Invalid input(s): PROCALCITONIN,  WBC,  LACTICIDVEN Microbiology Recent Results (from the past 240 hour(s))  Resp Panel by RT-PCR (Flu A&B, Covid) Nasopharyngeal Swab     Status: None   Collection Time: 03/31/21  4:14 AM   Specimen: Nasopharyngeal Swab; Nasopharyngeal(NP) swabs in vial transport medium  Result Value Ref Range Status   SARS Coronavirus 2 by RT PCR NEGATIVE NEGATIVE Final    Comment: (NOTE) SARS-CoV-2 target nucleic acids are NOT DETECTED.  The SARS-CoV-2 RNA is generally detectable in upper respiratory specimens during the acute phase of infection. The lowest concentration of SARS-CoV-2 viral copies this assay can detect is 138 copies/mL. A negative result does not preclude SARS-Cov-2 infection and should not be used as the sole basis for treatment or other patient management decisions. A negative result may occur with  improper specimen collection/handling, submission of specimen other than nasopharyngeal swab, presence of viral mutation(s) within the areas targeted by this assay, and inadequate number of viral copies(<138 copies/mL). A negative result must be combined with  clinical observations, patient history, and epidemiological information. The expected result is  Negative.  Fact Sheet for Patients:  EntrepreneurPulse.com.au  Fact Sheet for Healthcare Providers:  IncredibleEmployment.be  This test is no t yet approved or cleared by the [place] FDA and  has been authorized for detection and/or diagnosis of SARS-CoV-2 by FDA under an Emergency Use Authorization (EUA). This EUA will remain  in effect (meaning this test can be used) for the duration of the COVID-19 declaration under Section 564(b)(1) of the Act, 21 U.S.C.section 360bbb-3(b)(1), unless the authorization is terminated  or revoked sooner.       Influenza A by PCR NEGATIVE NEGATIVE Final   Influenza B by PCR NEGATIVE NEGATIVE Final    Comment: (NOTE) The Xpert Xpress SARS-CoV-2/FLU/RSV plus assay is intended as an aid in the diagnosis of influenza from Nasopharyngeal swab specimens and should not be used as a sole basis for treatment. Nasal washings and aspirates are unacceptable for Xpert Xpress SARS-CoV-2/FLU/RSV testing.  Fact Sheet for Patients: EntrepreneurPulse.com.au  Fact Sheet for Healthcare Providers: IncredibleEmployment.be  This test is not yet approved or cleared by the [place] FDA and has been authorized for detection and/or diagnosis of SARS-CoV-2 by FDA under an Emergency Use Authorization (EUA). This EUA will remain in effect (meaning this test can be used) for the duration of the COVID-19 declaration under Section 564(b)(1) of the Act, 21 U.S.C. section 360bbb-3(b)(1), unless the authorization is terminated or revoked.  Performed at [facility], [address] [place], [place] [address]   Urine Culture     Status: Abnormal   Collection Time: 03/31/21  7:05 PM   Specimen: Urine, Clean Catch  Result Value Ref Range Status   Specimen Description   Final    URINE, CLEAN CATCH Performed at [facility], [address] [place], [place] [address]    Special Requests   Final     NONE Performed at [facility], [address], [place], [place] [address]    Culture MULTIPLE SPECIES PRESENT, SUGGEST RECOLLECTION (A)  Final   Report Status 04/02/2021 FINAL  Final  SARS CORONAVIRUS 2 (TAT 6-24 HRS) Nasopharyngeal Nasopharyngeal Swab     Status: None   Collection Time: 03/31/21  8:22 PM   Specimen: Nasopharyngeal Swab  Result Value Ref Range Status   SARS Coronavirus 2 NEGATIVE NEGATIVE Final    Comment: (NOTE) SARS-CoV-2 target nucleic acids are NOT DETECTED.  The SARS-CoV-2 RNA is generally detectable in upper and lower respiratory specimens during the acute phase of infection. Negative results do not preclude SARS-CoV-2 infection, do not rule out co-infections with other pathogens, and should not be used as the sole basis for treatment or other patient management decisions. Negative results must be combined with clinical observations, patient history, and epidemiological information. The expected result is Negative.  Fact Sheet for Patients: SugarRoll.be  Fact Sheet for Healthcare Providers: https://www.woods-mathews.com/  This test is not yet approved or cleared by the [place] FDA and  has been authorized for detection and/or diagnosis of SARS-CoV-2 by FDA under an Emergency Use Authorization (EUA). This EUA will remain  in effect (meaning this test can be used) for the duration of the COVID-19 declaration under Se ction 564(b)(1) of the Act, 21 U.S.C. section 360bbb-3(b)(1), unless the authorization is terminated or revoked sooner.  Performed at [facility], [place] [address], [place],  [address]      Time coordinating discharge: 35 minutes  SIGNED:   Rodena Goldmann, DO  [facility] 04/02/2021, 10:50 AM  If 7PM-7AM, please contact night-coverage [email or web address]

## 2021-04-02 NOTE — Progress Notes (Signed)
Lantus 10 units SQ x 1 ordered for this am. CBG 119 prior to breakfast. Patient refused Lantus once delivered by pharmacy. Discharging home today. Notified Dr. Manuella Ghazi. Stated okay, no problem. Also notified Dr. Manuella Ghazi she had loose stool this am. States it began last night after she ordered [employer] food and had it delivered. Believes food and/or drink she ordered upset her stomach. Stated okay, immodium PRN and okay to discharge home.

## 2021-04-02 NOTE — Progress Notes (Signed)
Patient had loose/soft stool, Dr. Manuella Ghazi aware. Immodium x 1 dose prior to discharge and okay to discharge home. PICC line flushed with heparin flush per protocol for discharge home.

## 2021-04-02 NOTE — Progress Notes (Signed)
Discharge instructions reviewed with patient. Given AVS. Patient aware of prescriptions sent to [facility] in [place] per provider. States she has Ancef antibiotic to resume as instructed via PICC line for discharge. No questions or concerns regarding this medication or administration. Verbalized understanding of instructions and follow-up with PCP as instructed. Patient in stable condition awaiting family arrival for transport home.

## 2021-04-02 NOTE — Plan of Care (Signed)

## 2021-04-03 LAB — HEMOGLOBIN A1C
Hgb A1c MFr Bld: 8.7 % — ABNORMAL HIGH (ref 4.8–5.6)
Mean Plasma Glucose: 203 mg/dL

## 2021-04-04 ENCOUNTER — Telehealth: Payer: Self-pay

## 2021-04-04 NOTE — Telephone Encounter (Signed)
Patient is calling complaining of vomiting  since starting the oral cipro after being discharged from the hospital on 04/02/21. Patient states she is taking the medication with food. Patient also on IV cefazolin. Patient not scheduled with out office for a hospital follow up until 04/10/21. Please advise. Kamsiyochukwu Spickler T Brooks Sailors

## 2021-04-04 NOTE — Telephone Encounter (Signed)
Do I need to have her stop it and just do IV abx until her visit with you?

## 2021-04-05 ENCOUNTER — Encounter (HOSPITAL_COMMUNITY): Payer: Self-pay

## 2021-04-05 ENCOUNTER — Emergency Department (HOSPITAL_COMMUNITY)
Admission: EM | Admit: 2021-04-05 | Discharge: 2021-04-05 | Disposition: A | Discharge status: Home / Self Care | Payer: Self-pay | Attending: Emergency Medicine | Admitting: Emergency Medicine

## 2021-04-05 ENCOUNTER — Other Ambulatory Visit (HOSPITAL_COMMUNITY)
Admission: RE | Admit: 2021-04-05 | Discharge: 2021-04-05 | Disposition: A | Discharge status: Home / Self Care | Payer: Self-pay | Source: Ambulatory Visit | Attending: Nephrology | Admitting: Nephrology

## 2021-04-05 ENCOUNTER — Ambulatory Visit (INDEPENDENT_AMBULATORY_CARE_PROVIDER_SITE_OTHER): Payer: Self-pay | Admitting: "Endocrinology

## 2021-04-05 ENCOUNTER — Other Ambulatory Visit: Payer: Self-pay

## 2021-04-05 ENCOUNTER — Encounter: Payer: Self-pay | Admitting: "Endocrinology

## 2021-04-05 VITALS — BP 120/86 | HR 96 | Ht 59.0 in | Wt 128.0 lb

## 2021-04-05 DIAGNOSIS — Z5321 Procedure and treatment not carried out due to patient leaving prior to being seen by health care provider: Secondary | ICD-10-CM | POA: Insufficient documentation

## 2021-04-05 DIAGNOSIS — Z452 Encounter for adjustment and management of vascular access device: Secondary | ICD-10-CM | POA: Insufficient documentation

## 2021-04-05 DIAGNOSIS — I10 Essential (primary) hypertension: Secondary | ICD-10-CM

## 2021-04-05 DIAGNOSIS — E1122 Type 2 diabetes mellitus with diabetic chronic kidney disease: Secondary | ICD-10-CM | POA: Insufficient documentation

## 2021-04-05 DIAGNOSIS — N189 Chronic kidney disease, unspecified: Secondary | ICD-10-CM | POA: Insufficient documentation

## 2021-04-05 DIAGNOSIS — Z9119 Patient's noncompliance with other medical treatment and regimen: Secondary | ICD-10-CM

## 2021-04-05 DIAGNOSIS — R809 Proteinuria, unspecified: Secondary | ICD-10-CM | POA: Insufficient documentation

## 2021-04-05 DIAGNOSIS — Z91199 Patient's noncompliance with other medical treatment and regimen due to unspecified reason: Secondary | ICD-10-CM

## 2021-04-05 DIAGNOSIS — E1065 Type 1 diabetes mellitus with hyperglycemia: Secondary | ICD-10-CM

## 2021-04-05 LAB — PROTEIN, URINE, 24 HOUR
Collection Interval-UPROT: 24 hours
Protein, 24H Urine: 5168 mg/d — ABNORMAL HIGH (ref 50–100)
Protein, Urine: 272 mg/dL
Urine Total Volume-UPROT: 1900 mL

## 2021-04-05 LAB — RENAL FUNCTION PANEL
Albumin: 2.1 g/dL — ABNORMAL LOW (ref 3.5–5.0)
Anion gap: 7 (ref 5–15)
BUN: 17 mg/dL (ref 6–20)
CO2: 28 mmol/L (ref 22–32)
Calcium: 8.4 mg/dL — ABNORMAL LOW (ref 8.9–10.3)
Chloride: 101 mmol/L (ref 98–111)
Creatinine, Ser: 1.19 mg/dL — ABNORMAL HIGH (ref 0.44–1.00)
GFR, Estimated: >60 mL/min (ref >60)
Glucose, Bld: 140 mg/dL — ABNORMAL HIGH (ref 70–99)
Phosphorus: 4.1 mg/dL (ref 2.5–4.6)
Potassium: 3.7 mmol/L (ref 3.5–5.1)
Sodium: 136 mmol/L (ref 135–145)

## 2021-04-05 LAB — FERRITIN: Ferritin: 193 ng/mL (ref 11–307)

## 2021-04-05 LAB — CREATININE, URINE, 24 HOUR
Collection Interval-UCRE24: 24 hours
Creatinine, 24H Ur: 586 mg/d — ABNORMAL LOW (ref 600–1800)
Creatinine, Urine: 30.86 mg/dL
Urine Total Volume-UCRE24: 1900 mL

## 2021-04-05 LAB — CBC
HCT: 33 % — ABNORMAL LOW (ref 36.0–46.0)
Hemoglobin: 10.5 g/dL — ABNORMAL LOW (ref 12.0–15.0)
MCH: 29 pg (ref 26.0–34.0)
MCHC: 31.8 g/dL (ref 30.0–36.0)
MCV: 91.2 fL (ref 80.0–100.0)
Platelets: 350 10*3/uL (ref 150–400)
RBC: 3.62 MIL/uL — ABNORMAL LOW (ref 3.87–5.11)
RDW: 12.5 % (ref 11.5–15.5)
WBC: 6 10*3/uL (ref 4.0–10.5)
nRBC: 0 % (ref 0.0–0.2)

## 2021-04-05 LAB — VITAMIN B12: Vitamin B-12: 473 pg/mL (ref 180–914)

## 2021-04-05 LAB — IRON AND TIBC
Iron: 80 ug/dL (ref 28–170)
Saturation Ratios: 33 % — ABNORMAL HIGH (ref 10.4–31.8)
TIBC: 241 ug/dL — ABNORMAL LOW (ref 250–450)
UIBC: 161 ug/dL

## 2021-04-05 LAB — VITAMIN D 25 HYDROXY (VIT D DEFICIENCY, FRACTURES): Vit D, 25-Hydroxy: 8.43 ng/mL — ABNORMAL LOW (ref 30–100)

## 2021-04-05 LAB — FOLATE: Folate: 17.5 ng/mL (ref >5.9)

## 2021-04-05 NOTE — Patient Instructions (Signed)

## 2021-04-05 NOTE — ED Triage Notes (Signed)
Pt to er, pt states that she has had a picc line for the past month, states that she is here because it wouldn't draw blood.  States that this am they attempted to draw blood and it wouldn't draw back.

## 2021-04-05 NOTE — Telephone Encounter (Signed)
I spoke with the patient and she states she has had at least 2 days, but does not feel she can do a 3rd day. She states as soon as she take the medication she starts vomiting.  Please advise

## 2021-04-05 NOTE — Telephone Encounter (Signed)
Patient had a UTI at discharge from the hospital and was prescribed cipro for 8 days

## 2021-04-05 NOTE — Progress Notes (Signed)
04/05/2021, 2:27 PM  Endocrinology follow-up note   Subjective:    Patient ID: Diamond [name], female    DOB: 1996-08-28.  Diamond [name] is being seen in follow-up after she was seen in consultation for management of currently uncontrolled symptomatic type I diabetes. She is accompanied by her aide who is assisting her with her monitoring and insulin administration activities.    Past Medical History:  Diagnosis Date  . ADHD (attention deficit hyperactivity disorder) 2007  . Allergy   . Asthma   . Bipolar disorder ([place])   . Crohn's colitis ([place])    Dx Dr. Cherrie Gauze  . Depression    early childhood, trial of seroquel?  . Diabetic neuropathy ([place])   . Edema   . GERD (gastroesophageal reflux disease)   . Hypertension   . Pancreatitis   . Panic attacks   . PTSD (post-traumatic stress disorder)   . Stroke ([place]) 07/2020   R side  . Type 1 diabetes ([place]) 2008    Past Surgical History:  Procedure Laterality Date  . ADENOIDECTOMY    . BIOPSY  02/01/2021   Procedure: BIOPSY;  Surgeon: Rogene Houston, MD;  [facility];  Service: Endoscopy;;  duodenal, esophageal,  . BIOPSY  02/03/2021   Procedure: BIOPSY;  Surgeon: Eloise Harman, DO;  [facility];  Service: Endoscopy;;  . COLONOSCOPY WITH PROPOFOL N/A 02/01/2021   Procedure: COLONOSCOPY WITH PROPOFOL;  Surgeon: Rogene Houston, MD;  [facility];  Service: Endoscopy;  Laterality: N/A;  . COLONOSCOPY WITH PROPOFOL N/A 02/03/2021   Procedure: COLONOSCOPY WITH PROPOFOL;  Surgeon: Eloise Harman, DO;  [facility];  Service: Endoscopy;  Laterality: N/A;  . ESOPHAGOGASTRODUODENOSCOPY (EGD) WITH PROPOFOL N/A 02/01/2021   Procedure: ESOPHAGOGASTRODUODENOSCOPY (EGD) WITH PROPOFOL;  Surgeon: Rogene Houston, MD;  [facility];  Service: Endoscopy;  Laterality: N/A;  . HERNIA REPAIR     . TEE WITHOUT CARDIOVERSION N/A 03/09/2021   Procedure: TRANSESOPHAGEAL ECHOCARDIOGRAM (TEE) WITH PROPOFOL;  Surgeon: Arnoldo Lenis, MD;  [facility];  Service: Endoscopy;  Laterality: N/A;  . TONSILLECTOMY      Social History   Socioeconomic History  . Marital status: Single    Spouse name: Not on file  . Number of children: Not on file  . Years of education: Not on file  . Highest education level: Not on file  Occupational History  . Not on file  Tobacco Use  . Smoking status: Never Smoker  . Smokeless tobacco: Never Used  Vaping Use  . Vaping Use: Former  Substance and Sexual Activity  . Alcohol use: No  . Drug use: No  . Sexual activity: Yes  Other Topics Concern  . Not on file  Social History Narrative  . Not on file   Social Determinants of Health   Financial Resource Strain: Not on file  Food Insecurity: Not on file  Transportation Needs: Not on file  Physical Activity: Not on file  Stress: Not on file  Social Connections: Not on file  Family History  Problem Relation Age of Onset  . Hypertension Mother   . Diabetes Mother   . Heart disease Mother   . Crohn's disease Father   . Alcohol abuse Father   . Drug abuse Father   . Mental illness Father   . Cancer Father   . Hypertension Maternal Aunt   . Seizures Maternal Aunt   . Depression Maternal Aunt   . Asthma Maternal Uncle   . Hypertension Maternal Uncle   . Cancer Maternal Grandmother   . Diabetes Maternal Grandmother   . Mental illness Maternal Grandfather     Outpatient Encounter Medications as of 04/05/2021  Medication Sig  . acetaminophen (TYLENOL) 500 MG tablet Take by mouth.  Marland Kitchen albuterol (VENTOLIN HFA) 108 (90 Base) MCG/ACT inhaler Inhale 2 puffs into the lungs every 6 (six) hours as needed for wheezing or shortness of breath. For shortness of breath  . ceFAZolin (ANCEF) IVPB Inject 2 g into the vein every 8 (eight) hours. Indication:  Bacteremia, possible discitis First  Dose: No Last Day of Therapy:  04/17/21 Labs - Once weekly:  CBC/D and BMP, Labs - Every other week:  ESR and CRP Method of administration: IV Push Method of administration may be changed at the discretion of home infusion pharmacist based upon assessment of the patient and/or caregiver's ability to self-administer the medication ordered.  . ciprofloxacin (CIPRO) 500 MG tablet Take 1 tablet (500 mg total) by mouth 2 (two) times daily for 8 days.  . citalopram (CELEXA) 10 MG tablet Take 10 mg by mouth daily.  . diphenhydrAMINE (BENADRYL) 12.5 MG/5ML elixir Take 10 mLs (25 mg total) by mouth every 6 (six) hours as needed for itching, allergies or sleep.  . furosemide (LASIX) 20 MG tablet Take 20 mg by mouth daily.  Marland Kitchen gabapentin (NEURONTIN) 100 MG capsule Take 1 capsule (100 mg total) by mouth 2 (two) times daily.  . hydrALAZINE (APRESOLINE) 25 MG tablet Take 1 tablet (25 mg total) by mouth 2 (two) times daily.  . hydrochlorothiazide (HYDRODIURIL) 25 MG tablet Take 25 mg by mouth daily.  . insulin glargine (LANTUS) 100 UNIT/ML injection Inject 40 Units into the skin at bedtime.  . Insulin Glulisine (APIDRA [place]) Inject 10-16 Units into the skin 3 (three) times daily before meals. ssi  . isosorbide mononitrate (IMDUR) 30 MG 24 hr tablet Take 0.5 tablets (15 mg total) by mouth daily.  Marland Kitchen loperamide (IMODIUM A-D) 2 MG tablet Take 1 tablet (2 mg total) by mouth 4 (four) times daily as needed for diarrhea or loose stools.  . metoCLOPramide (REGLAN) 5 MG tablet Take 1 tablet (5 mg total) by mouth every 8 (eight) hours as needed for refractory nausea / vomiting (refractory symptoms).  . metoprolol tartrate (LOPRESSOR) 50 MG tablet Take 1 tablet (50 mg total) by mouth 2 (two) times daily.  Marland Kitchen omeprazole (PRILOSEC) 40 MG capsule Take 1 capsule (40 mg total) by mouth in the morning and at bedtime.  . ondansetron (ZOFRAN ODT) 8 MG disintegrating tablet Take 1 tablet (8 mg total) by mouth every 8 (eight) hours as  needed for nausea or vomiting. (Patient not taking: Reported on 03/31/2021)  . oxyCODONE (OXY IR/ROXICODONE) 5 MG immediate release tablet Take 1 tablet (5 mg total) by mouth every 4 (four) hours as needed for moderate pain, severe pain or breakthrough pain.  . potassium chloride (KLOR-CON) 10 MEQ tablet Take 10 mEq by mouth daily.  . traZODone (DESYREL) 50 MG tablet Take 1 tablet (  50 mg total) by mouth at bedtime.   No facility-administered encounter medications on file as of 04/05/2021.    ALLERGIES: Allergies  Allergen Reactions  . Amoxicillin Hives    Did it involve swelling of the face/tongue/throat, SOB, or low BP? no  Did it involve sudden or severe rash/hives, skin peeling, or any reaction on the inside of your mouth or nose? no Did you need to seek medical attention at a hospital or doctor's office? no When did it last happen?unk If all above answers are "NO", may proceed with cephalosporin use.  Marland Kitchen Dexamethasone Hives and Itching  . Doxycycline Itching and Swelling  . Penicillins Hives and Itching    Did it involve swelling of the face/tongue/throat, SOB, or low BP? no  Did it involve sudden or severe rash/hives, skin peeling, or any reaction on the inside of your mouth or nose? no Did you need to seek medical attention at a hospital or doctor's office? no When did it last happen?unk If all above answers are "NO", may proceed with cephalosporin use. Did it involve swelling of the face/tongue/throat, SOB, or low BP? no  Did it involve sudden or severe rash/hives, skin peeling, or any reac  . Ceftriaxone Sodium In Dextrose Itching  . Augmentin [Amoxicillin-Pot Clavulanate] Other (See Comments)    Reaction unknown  . Clavulanic Acid   . Adhesive [Tape] Itching and Rash  . Latex Itching and Rash  . Other Itching and Rash  . Pineapple Rash    Rash on tongue and throat    VACCINATION STATUS: Immunization History  Administered Date(s) Administered  . HPV  Quadrivalent 08/27/2012  . Hepatitis A 08/27/2012  . Influenza Split 08/27/2012  . Meningococcal Conjugate 08/27/2012  . Moderna Sars-Covid-2 Vaccination 03/02/2020, 03/29/2020  . Pneumococcal Polysaccharide-23 01/26/2021    Diabetes She presents for her follow-up diabetic visit. She has type 1 diabetes mellitus. Onset time: She was diagnosed at approximate age of 83 years. Her disease course has been worsening. Pertinent negatives for hypoglycemia include no confusion, headaches, pallor or seizures. Associated symptoms include blurred vision, fatigue, foot paresthesias, foot ulcerations, polydipsia and polyuria. Pertinent negatives for diabetes include no chest pain and no polyphagia. There are no hypoglycemic complications. Symptoms are improving. Diabetic complications include peripheral neuropathy. (Lack of adequate health insurance.  Noncompliance/nonengagement.) Risk factors for coronary artery disease include diabetes mellitus, family history, sedentary lifestyle and tobacco exposure. Current diabetic treatment includes insulin injections. She is compliant with treatment some of the time. Her weight is fluctuating minimally. She is following a generally unhealthy diet. When asked about meal planning, she reported none. She has not had a previous visit with a dietitian. She never participates in exercise. Her home blood glucose trend is fluctuating minimally. Her breakfast blood glucose range is generally 140-180 mg/dl. Her lunch blood glucose range is generally 140-180 mg/dl. Her dinner blood glucose range is generally 140-180 mg/dl. Her bedtime blood glucose range is generally 140-180 mg/dl. Her overall blood glucose range is 140-180 mg/dl. (She presents with significantly improved profile with A1c of 8.7%, progressively improving from greater than 15.5%.  No major hypoglycemia.  However, she is still struggles too much of her meals/insulin injections and missed at least 25% of her rapid acting  insulin opportunities.      ) An ACE inhibitor/angiotensin II receptor blocker is being taken.     Review of Systems  Constitutional: Positive for fatigue. Negative for chills, fever and unexpected weight change.  HENT: Negative for trouble swallowing and  voice change.   Eyes: Positive for blurred vision. Negative for visual disturbance.  Respiratory: Negative for cough, shortness of breath and wheezing.   Cardiovascular: Negative for chest pain, palpitations and leg swelling.  Gastrointestinal: Positive for nausea and vomiting. Negative for diarrhea.  Endocrine: Positive for polydipsia and polyuria. Negative for cold intolerance, heat intolerance and polyphagia.  Musculoskeletal: Negative for arthralgias and myalgias.  Skin: Negative for color change, pallor, rash and wound.  Neurological: Negative for seizures and headaches.  Psychiatric/Behavioral: Negative for confusion and suicidal ideas.    Objective:    Vitals with BMI 04/05/2021 04/02/2021 04/02/2021  Height 4' 11"  - -  Weight 128 lbs - -  BMI 03.00 - -  Systolic 923 98 300  Diastolic 86 72 762  Pulse 96 98 103    BP 120/86   Pulse 96   Ht 4' 11"  (1.499 m)   Wt 128 lb (58.1 kg)   BMI 25.85 kg/m   Wt Readings from Last 3 Encounters:  04/05/21 128 lb (58.1 kg)  03/31/21 125 lb (56.7 kg)  03/28/21 125 lb (56.7 kg)     Physical Exam Constitutional:      Appearance: She is well-developed.  HENT:     Head: Normocephalic and atraumatic.  Neck:     Thyroid: No thyromegaly.     Trachea: No tracheal deviation.  Cardiovascular:     Comments: Distant heart sounds. Pulmonary:     Effort: Pulmonary effort is normal.  Abdominal:     Tenderness: There is no abdominal tenderness. There is no guarding.  Musculoskeletal:        General: No swelling. Normal range of motion.     Cervical back: Normal range of motion and neck supple.     Right lower leg: No edema.     Left lower leg: No edema.  Skin:    General: Skin is  warm and dry.     Coloration: Skin is not pale.     Findings: No erythema or rash.  Neurological:     Mental Status: She is alert and oriented to person, place, and time.     Cranial Nerves: No cranial nerve deficit.     Coordination: Coordination normal.     Deep Tendon Reflexes: Reflexes are normal and symmetric.  Psychiatric:     Comments: Patient is very reluctant.  She displays unconcerned affect.     CMP ( most recent) CMP     Component Value Date/Time   NA 136 04/02/2021 0503   K 3.7 04/02/2021 0503   CL 103 04/02/2021 0503   CO2 26 04/02/2021 0503   GLUCOSE 300 (H) 04/02/2021 0503   BUN 13 04/02/2021 0503   CREATININE 1.26 (H) 04/02/2021 0503   CREATININE 0.69 07/08/2012 1203   CALCIUM 7.9 (L) 04/02/2021 0503   PROT 5.4 (L) 04/01/2021 0527   ALBUMIN 1.6 (L) 04/01/2021 0527   AST 11 (L) 04/01/2021 0527   ALT 8 04/01/2021 0527   ALKPHOS 86 04/01/2021 0527   BILITOT 0.6 04/01/2021 0527   GFRNONAA >60 04/02/2021 0503   GFRAA >60 08/26/2020 0507    Diabetic Labs (most recent): Lab Results  Component Value Date   HGBA1C 8.7 (H) 04/01/2021   HGBA1C 10.4 (H) 03/16/2021   HGBA1C 5.9 (H) 03/02/2021    Lab Results  Component Value Date   TSH 2.228 01/30/2021   TSH 1.078 08/25/2020   TSH 1.181 07/08/2012      Assessment & Plan:   1. Uncontrolled  type 1 diabetes mellitus with hyperglycemia (HCC) - Diamond [name] has currently uncontrolled symptomatic type 1 DM since  25 years of age. Her work-up in the hospital was negative for CHF, or ascites.  She has hypoalbuminemia.  She presents with significantly improved profile with A1c of 8.7%, progressively improving from greater than 15.5%.  No major hypoglycemia.  However, she is still struggles too much of her meals/insulin injections and missed at least 25% of her rapid acting insulin opportunities.    - Recent labs reviewed. - I had a long discussion with her and her aide about the progressive nature of diabetes  and the pathology behind its complications. -her diabetes is complicated by chronic nonadherence/noncompliance, peripheral neuropathy and she remains at exceedingly  high risk for more acute and chronic complications which include CAD, CVA, CKD, retinopathy, and neuropathy. These are all discussed in detail with her.  - I have counseled her on diet management  by adopting a carbohydrate restricted/protein rich diet. Patient is encouraged to switch to  unprocessed or minimally processed     complex starch and increased protein intake (animal or plant source), fruits, and vegetables.  -  she is advised to stick to a routine mealtimes to eat 3 meals  a day and avoid unnecessary snacks ( to snack only to correct hypoglycemia).   - she acknowledges that there is a room for improvement in her food and drink choices. - Suggestion is made for her to avoid simple carbohydrates  from her diet including Cakes, Sweet Desserts, Ice Cream, Soda (diet and regular), Sweet Tea, Candies, Chips, Cookies, Store Bought Juices, Alcohol in Excess of  1-2 drinks a day, Artificial Sweeteners,  Coffee Creamer, and "Sugar-free" Products, Lemonade. This will help patient to have more stable blood glucose profile and potentially avoid unintended weight gain.   - she has been scheduled with Jearld Fenton, RDN, CDE for diabetes education.  She did not keep her follow-up.  - I have reapproached her with the following individualized plan to manage  her diabetes and patient agrees:   - she has an aide from integrative medicine who promises to offer her help.   -Her presentation lungs are consistent with treatment effect and largely near target glycemic profile.   -She is advised to continue Lantus 40 units nightly, continue Apidra 10  units 3 times a day with meals  for pre-meal BG readings of 90-15m/dl, plus patient specific correction dose for unexpected hyperglycemia above 1524mdl, associated with strict monitoring of glucose  4 times a day-before meals and at bedtime. - she is warned not to take insulin without proper monitoring per orders. - Adjustment parameters are given to her for hypo and hyperglycemia in writing. - she is encouraged to call clinic for blood glucose levels less than 70 or above 300 mg /dl.  - she is not a candidate for metformin, SGLT2 inhibitors, nor incretin therapy.   - Specific targets for  A1c;  LDL, HDL,  and Triglycerides were discussed with the patient.  2) Blood Pressure /Hypertension: Her blood pressure is controlled to target.  She is advised to continue hydralazine 25 mg p.o. twice daily, metoprolol 50 mg p.o. twice daily.    3) Lipids/Hyperlipidemia: She does not have any lipid panel to review.     She will be considered for fasting lipid panel on subsequent visits.   4)  Weight/Diet:  Body mass index is 25.85 kg/m.  -She is not a candidate for weight loss.  Exercise,  and detailed carbohydrates information provided  -  detailed on discharge instructions.  5) Chronic Care/Health Maintenance:  -she  is not  on ACEI/ARB and Statin medications and  is encouraged to initiate and continue to follow up with Ophthalmology, Dentist,  Podiatrist at least yearly or according to recommendations, and advised to   stay away from smoking. I have recommended yearly flu vaccine and pneumonia vaccine at least every 5 years;  and  sleep for at least 7 hours a day.  -She is advised to increase her Lasix to 40 mg p.o. every morning.  This patient is in desperate need for social worker to assist with her social life.  She has significant social constraints including lack of health  insurance which are interfering with her diabetes care.  - she is  advised to maintain close follow up with her PCP for primary care needs, as well as her other providers for optimal and coordinated care.   I spent 30 minutes in the care of the patient today including review of her recent labs, glycemic profile, CMP,  and other relevant labs ; imaging/biopsy records (current and previous including abstractions from other facilities); face-to-face time discussing  her lab results and symptoms, medications doses, her options of short and long term treatment based on the latest standards of care / guidelines;   and documenting the encounter.  Diamond [name]  participated in the discussions, expressed understanding, and voiced agreement with the above plans.  All questions were answered to her satisfaction. she is encouraged to contact clinic should she have any questions or concerns prior to her return visit.    Follow up plan: - Return in about 3 months (around 07/10/2021) for Bring Meter and Logs- A1c in Office.  Glade Lloyd, MD [facility] [address] [place], [place] [address] Phone: [phone]  Fax: [phone]    04/05/2021, 2:27 PM  This note was partially dictated with voice recognition software. Similar sounding words can be transcribed inadequately or may not  be corrected upon review.

## 2021-04-05 NOTE — Telephone Encounter (Signed)
8 days is overkill. If she had 3 she had enough

## 2021-04-06 ENCOUNTER — Other Ambulatory Visit: Payer: Self-pay

## 2021-04-06 ENCOUNTER — Emergency Department (HOSPITAL_COMMUNITY)
Admission: EM | Admit: 2021-04-06 | Discharge: 2021-04-06 | Discharge status: Against Medical Advice | Payer: Self-pay | Attending: Emergency Medicine | Admitting: Emergency Medicine

## 2021-04-06 ENCOUNTER — Emergency Department (HOSPITAL_COMMUNITY): Payer: Self-pay

## 2021-04-06 ENCOUNTER — Telehealth: Payer: Self-pay

## 2021-04-06 ENCOUNTER — Encounter (HOSPITAL_COMMUNITY): Payer: Self-pay | Admitting: *Deleted

## 2021-04-06 DIAGNOSIS — T82898A Other specified complication of vascular prosthetic devices, implants and grafts, initial encounter: Secondary | ICD-10-CM | POA: Insufficient documentation

## 2021-04-06 DIAGNOSIS — R0789 Other chest pain: Secondary | ICD-10-CM | POA: Insufficient documentation

## 2021-04-06 DIAGNOSIS — Z794 Long term (current) use of insulin: Secondary | ICD-10-CM | POA: Insufficient documentation

## 2021-04-06 DIAGNOSIS — J45909 Unspecified asthma, uncomplicated: Secondary | ICD-10-CM | POA: Insufficient documentation

## 2021-04-06 DIAGNOSIS — Z91040 Latex allergy status: Secondary | ICD-10-CM | POA: Insufficient documentation

## 2021-04-06 DIAGNOSIS — K3184 Gastroparesis: Secondary | ICD-10-CM | POA: Insufficient documentation

## 2021-04-06 DIAGNOSIS — E1140 Type 2 diabetes mellitus with diabetic neuropathy, unspecified: Secondary | ICD-10-CM | POA: Insufficient documentation

## 2021-04-06 DIAGNOSIS — T82898S Other specified complication of vascular prosthetic devices, implants and grafts, sequela: Secondary | ICD-10-CM

## 2021-04-06 DIAGNOSIS — R110 Nausea: Secondary | ICD-10-CM | POA: Insufficient documentation

## 2021-04-06 DIAGNOSIS — Z79899 Other long term (current) drug therapy: Secondary | ICD-10-CM | POA: Insufficient documentation

## 2021-04-06 DIAGNOSIS — R519 Headache, unspecified: Secondary | ICD-10-CM | POA: Insufficient documentation

## 2021-04-06 DIAGNOSIS — I10 Essential (primary) hypertension: Secondary | ICD-10-CM | POA: Insufficient documentation

## 2021-04-06 DIAGNOSIS — Z4502 Encounter for adjustment and management of automatic implantable cardiac defibrillator: Secondary | ICD-10-CM | POA: Insufficient documentation

## 2021-04-06 DIAGNOSIS — E1143 Type 2 diabetes mellitus with diabetic autonomic (poly)neuropathy: Secondary | ICD-10-CM | POA: Insufficient documentation

## 2021-04-06 LAB — COMPREHENSIVE METABOLIC PANEL
ALT: 11 U/L (ref 0–44)
AST: 12 U/L — ABNORMAL LOW (ref 15–41)
Albumin: 2.1 g/dL — ABNORMAL LOW (ref 3.5–5.0)
Alkaline Phosphatase: 101 U/L (ref 38–126)
Anion gap: 6 (ref 5–15)
BUN: 12 mg/dL (ref 6–20)
CO2: 28 mmol/L (ref 22–32)
Calcium: 8.3 mg/dL — ABNORMAL LOW (ref 8.9–10.3)
Chloride: 99 mmol/L (ref 98–111)
Creatinine, Ser: 0.98 mg/dL (ref 0.44–1.00)
GFR, Estimated: >60 mL/min (ref >60)
Glucose, Bld: 453 mg/dL — ABNORMAL HIGH (ref 70–99)
Potassium: 3.8 mmol/L (ref 3.5–5.1)
Sodium: 133 mmol/L — ABNORMAL LOW (ref 135–145)
Total Bilirubin: 0.7 mg/dL (ref 0.3–1.2)
Total Protein: 6.2 g/dL — ABNORMAL LOW (ref 6.5–8.1)

## 2021-04-06 LAB — CBC WITH DIFFERENTIAL/PLATELET
Abs Immature Granulocytes: 0.02 10*3/uL (ref 0.00–0.07)
Basophils Absolute: 0.1 10*3/uL (ref 0.0–0.1)
Basophils Relative: 1 %
Eosinophils Absolute: 0.4 10*3/uL (ref 0.0–0.5)
Eosinophils Relative: 6 %
HCT: 32.2 % — ABNORMAL LOW (ref 36.0–46.0)
Hemoglobin: 10.9 g/dL — ABNORMAL LOW (ref 12.0–15.0)
Immature Granulocytes: 0 %
Lymphocytes Relative: 33 %
Lymphs Abs: 1.9 10*3/uL (ref 0.7–4.0)
MCH: 29.9 pg (ref 26.0–34.0)
MCHC: 33.9 g/dL (ref 30.0–36.0)
MCV: 88.5 fL (ref 80.0–100.0)
Monocytes Absolute: 0.4 10*3/uL (ref 0.1–1.0)
Monocytes Relative: 7 %
Neutro Abs: 3.1 10*3/uL (ref 1.7–7.7)
Neutrophils Relative %: 53 %
Platelets: 374 10*3/uL (ref 150–400)
RBC: 3.64 MIL/uL — ABNORMAL LOW (ref 3.87–5.11)
RDW: 12.2 % (ref 11.5–15.5)
WBC: 5.8 10*3/uL (ref 4.0–10.5)
nRBC: 0 % (ref 0.0–0.2)

## 2021-04-06 LAB — TROPONIN I (HIGH SENSITIVITY): Troponin I (High Sensitivity): 10 ng/L (ref <18)

## 2021-04-06 MED ORDER — ONDANSETRON HCL 4 MG/2ML IJ SOLN
4.0000 mg | Freq: Once | INTRAMUSCULAR | Status: DC
  Filled 2021-04-06: qty 2

## 2021-04-06 MED ORDER — DIPHENHYDRAMINE HCL 50 MG/ML IJ SOLN
12.5000 mg | Freq: Once | INTRAMUSCULAR | Status: DC
  Filled 2021-04-06: qty 1

## 2021-04-06 MED ORDER — METOCLOPRAMIDE HCL 5 MG/ML IJ SOLN
10.0000 mg | Freq: Once | INTRAMUSCULAR | Status: DC
  Filled 2021-04-06: qty 2

## 2021-04-06 MED ORDER — ALTEPLASE 100 MG IV SOLR: 2.0000 mg | Freq: Once | INTRAVENOUS | Status: DC

## 2021-04-06 MED ORDER — SODIUM CHLORIDE 0.9 % IV BOLUS
1000.0000 mL | Freq: Once | INTRAVENOUS | Status: DC
Start: 2021-04-06 — End: 2021-04-06

## 2021-04-06 NOTE — ED Notes (Signed)
Unable to sign MSE

## 2021-04-06 NOTE — ED Provider Notes (Signed)
Emergency Medicine Provider Triage Evaluation Note  Diamond [name] , a 25 y.o. female  was evaluated in triage.  Pt complains of issues with her PICC line.  Patient had a PICC line placed at a previous admission.  She is on Ancef 3 times daily for staph aureus bacteremia as well as discitis.  She states that she was seen by her doctor yesterday and they were not able to draw labs from her PICC line.  She was also not able to push any medications through her PICC line for the past 24 hours.  Since she has been having difficulty with a PICC line she also reports sharp pain to the right side of the chest.  No other complaints.  Physical Exam  BP (!) 150/111 (BP Location: Right Arm) Comment: triage nurse notified  Pulse (!) 105   Temp 99.2 F (37.3 C) (Oral)   Resp 18   Ht 4' 11"  (1.499 m)   Wt 58.1 kg   SpO2 97%   BMI 25.85 kg/m  Gen:   Awake, no distress   Resp:  Normal effort  MSK:   Moves extremities without difficulty  Other:    Medical Decision Making  Medically screening exam initiated at 3:00 PM.  Appropriate orders placed.  Diamond [name] was informed that the remainder of the evaluation will be completed by another provider, this initial triage assessment does not replace that evaluation, and the importance of remaining in the ED until their evaluation is complete.    Rayna Sexton, PA-C 04/06/21 1501    Truddie Hidden, MD 04/06/21 [phone]

## 2021-04-06 NOTE — ED Notes (Signed)
Pt refused treatment, Gwyndolyn Saxon, [place] made aware.

## 2021-04-06 NOTE — ED Triage Notes (Signed)
picc line not functioning for 2 days

## 2021-04-06 NOTE — Telephone Encounter (Signed)
Patient denies any UTI symptoms and states she has stopped the Cipro. She states she feels fine and she will follow up on 04/10/21 with our office [facility] Bowring T [place] S[place]

## 2021-04-06 NOTE — Telephone Encounter (Signed)
Would you like or Home Health to go ahead and remove picc line?

## 2021-04-06 NOTE — Discharge Instructions (Addendum)
Please continue with your medications as prescribed.  Follow-up with your PCP as needed  Come back to the emergency department if you develop chest pain, shortness of breath, severe abdominal pain, uncontrolled nausea, vomiting, diarrhea.

## 2021-04-06 NOTE — Telephone Encounter (Signed)
Received from [facility]. Patient currently IV therapy paid via charity care. Charity ends tomorrow and patient is unable to pay for medication.  Forwarding to Dr. Lucianne Lei for alternative medication advise.   Current therapy Cefazolin for total 6 weeks, end 04/17/21  Diamond [name]

## 2021-04-06 NOTE — ED Provider Notes (Signed)
[facility] Provider Note   CSN: [id number] Arrival date & time: 04/06/21  1440     History Chief Complaint  Patient presents with  . Vascular Access Problem    Diamond [name] is a 25 y.o. female.  HPI   Patient with significant medical history of type 2 diabetes, stroke, hypertension, GERD, gastroparesis, current PICC line receiving Ancef 3 times daily for bacteremia and discitis presents with chief complaint of blocked PICC line.  Patient endorses she has been unable to flush for the last 2 days, she has been unable to have any of her medications.  She denies pain at the site, redness or swelling, denies systemic infection like fevers or chills.  She endorsed that she has been having a slight headache, has increased sensitivity to noise and photophobia, she has occasional nausea without vomiting, she denies change in vision, paresthesia or weakness upper lower extremities, denies recent head trauma.  She also states that she has some slight chest discomfort, it comes and goes, does not radiate, not associate with shortness of breath, becoming diaphoretic, nausea or vomiting.  Patient denies alleviating factors.  Patient denies fevers, chills, shortness breath, abdominal pain, nausea, vomit, diarrhea, she pedal edema.  Past Medical History:  Diagnosis Date  . ADHD (attention deficit hyperactivity disorder) 2007  . Allergy   . Asthma   . Bipolar disorder ([place])   . Crohn's colitis ([place])    Dx Dr. Cherrie Gauze  . Depression    early childhood, trial of seroquel?  . Diabetic neuropathy ([place])   . Edema   . GERD (gastroesophageal reflux disease)   . Hypertension   . Pancreatitis   . Panic attacks   . PTSD (post-traumatic stress disorder)   . Stroke ([place]) 07/2020   R side  . Type 1 diabetes ([place]) 2008    Patient Active Problem List   Diagnosis Date Noted  . AKI (acute kidney injury) ([place]) 04/01/2021  . Pyelonephritis 03/31/2021  . Low back pain  without sciatica   . Intractable nausea and vomiting 03/16/2021  . Nausea and vomiting   . 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 Multifocal pneumonia 03/05/2021  . Bladder outlet obstruction   . Constipation   . Abdominal pain, epigastric   . Abnormal computed tomography of cecum and terminal ileum   . Hyperosmolar hyperglycemic state (HHS) ([place]) 01/30/2021  . Crohn's disease ([place]) 12/08/2020  . Diabetic gastroparesis ([place]) 12/08/2020  . Essential hypertension, benign 11/01/2020  . Edema 10/19/2020  . Anasarca 10/18/2020  . Hypoalbuminemia-due to proteinuria/nephrotic syndrome 10/18/2020  . Personal history of noncompliance with medical treatment, presenting hazards to health 10/12/2020  . Bilateral lower extremity edema 08/25/2020  . Pneumonia 08/25/2020  . Eczema 10/23/2012  . Abdominal pain 08/27/2012  . GERD (gastroesophageal reflux disease) 07/25/2012  . Mood disorder ([place]) 07/09/2012  . Contraception management 07/09/2012  . History of Crohn's disease 07/09/2012  . Uncontrolled type 1 diabetes mellitus with hyperglycemia and Diabetic Nephropathy 07/08/2012    Class: Chronic  . Asthma, persistent controlled 07/08/2012    Past Surgical History:  Procedure Laterality Date  . ADENOIDECTOMY    . BIOPSY  02/01/2021   Procedure: BIOPSY;  Surgeon: Rogene Houston, MD;  [facility];  Service: Endoscopy;;  duodenal, esophageal,  . BIOPSY  02/03/2021   Procedure: BIOPSY;  Surgeon: Eloise Harman, DO;  [facility];  Service: Endoscopy;;  .  COLONOSCOPY WITH PROPOFOL N/A 02/01/2021   Procedure: COLONOSCOPY WITH PROPOFOL;  Surgeon: Rogene Houston, MD;  [facility];  Service: Endoscopy;  Laterality: N/A;  . COLONOSCOPY WITH PROPOFOL N/A 02/03/2021   Procedure: COLONOSCOPY WITH PROPOFOL;  Surgeon: Eloise Harman, DO;  [facility];  Service: Endoscopy;  Laterality: N/A;  .  ESOPHAGOGASTRODUODENOSCOPY (EGD) WITH PROPOFOL N/A 02/01/2021   Procedure: ESOPHAGOGASTRODUODENOSCOPY (EGD) WITH PROPOFOL;  Surgeon: Rogene Houston, MD;  [facility];  Service: Endoscopy;  Laterality: N/A;  . HERNIA REPAIR    . TEE WITHOUT CARDIOVERSION N/A 03/09/2021   Procedure: TRANSESOPHAGEAL ECHOCARDIOGRAM (TEE) WITH PROPOFOL;  Surgeon: Arnoldo Lenis, MD;  [facility];  Service: Endoscopy;  Laterality: N/A;  . TONSILLECTOMY       OB History   No obstetric history on file.     Family History  Problem Relation Age of Onset  . Hypertension Mother   . Diabetes Mother   . Heart disease Mother   . Crohn's disease Father   . Alcohol abuse Father   . Drug abuse Father   . Mental illness Father   . Cancer Father   . Hypertension Maternal Aunt   . Seizures Maternal Aunt   . Depression Maternal Aunt   . Asthma Maternal Uncle   . Hypertension Maternal Uncle   . Cancer Maternal Grandmother   . Diabetes Maternal Grandmother   . Mental illness Maternal Grandfather     Social History   Tobacco Use  . Smoking status: Never Smoker  . Smokeless tobacco: Never Used  Vaping Use  . Vaping Use: Never used  Substance Use Topics  . Alcohol use: No  . Drug use: No    Home Medications Prior to Admission medications   Medication Sig Start Date End Date Taking? Authorizing Provider  acetaminophen (TYLENOL) 500 MG tablet Take by mouth. 04/21/20   [provider]  albuterol (VENTOLIN HFA) 108 (90 Base) MCG/ACT inhaler Inhale 2 puffs into the lungs every 6 (six) hours as needed for wheezing or shortness of breath. For shortness of breath 03/10/21   Roxan Hockey, MD  ceFAZolin (ANCEF) IVPB Inject 2 g into the vein every 8 (eight) hours. Indication:  Bacteremia, possible discitis First Dose: No Last Day of Therapy:  04/17/21 Labs - Once weekly:  CBC/D and BMP, Labs - Every other week:  ESR and CRP Method of administration: IV Push Method of  administration may be changed at the discretion of home infusion pharmacist based upon assessment of the patient and/or caregiver's ability to self-administer the medication ordered. 03/10/21 04/18/21  Roxan Hockey, MD  ciprofloxacin (CIPRO) 500 MG tablet Take 1 tablet (500 mg total) by mouth 2 (two) times daily for 8 days. 04/02/21 04/10/21  Manuella Ghazi, Pratik D, DO  citalopram (CELEXA) 10 MG tablet Take 10 mg by mouth daily.    [provider]  diphenhydrAMINE (BENADRYL) 12.5 MG/5ML elixir Take 10 mLs (25 mg total) by mouth every 6 (six) hours as needed for itching, allergies or sleep. 03/10/21   Roxan Hockey, MD  furosemide (LASIX) 20 MG tablet Take 20 mg by mouth daily. 04/25/20   [provider]  gabapentin (NEURONTIN) 100 MG capsule Take 1 capsule (100 mg total) by mouth 2 (two) times daily. 03/10/21   Roxan Hockey, MD  hydrALAZINE (APRESOLINE) 25 MG tablet Take 1 tablet (25 mg total) by mouth 2 (two) times daily. 03/10/21   Roxan Hockey, MD  hydrochlorothiazide (HYDRODIURIL) 25  MG tablet Take 25 mg by mouth daily. 02/11/20   [provider]  insulin glargine (LANTUS) 100 UNIT/ML injection Inject 40 Units into the skin at bedtime.    [provider]  Insulin Glulisine (APIDRA [place]) Inject 10-16 Units into the skin 3 (three) times daily before meals. ssi    [provider]  isosorbide mononitrate (IMDUR) 30 MG 24 hr tablet Take 0.5 tablets (15 mg total) by mouth daily. 03/19/21   Barton Dubois, MD  loperamide (IMODIUM A-D) 2 MG tablet Take 1 tablet (2 mg total) by mouth 4 (four) times daily as needed for diarrhea or loose stools. 04/02/21   Manuella Ghazi, Pratik D, DO  metoCLOPramide (REGLAN) 5 MG tablet Take 1 tablet (5 mg total) by mouth every 8 (eight) hours as needed for refractory nausea / vomiting (refractory symptoms). 03/19/21 03/19/22  Barton Dubois, MD  metoprolol tartrate (LOPRESSOR) 50 MG tablet Take 1 tablet (50 mg total) by mouth 2 (two) times daily. 03/23/21    [name], PA-C  omeprazole (PRILOSEC) 40 MG capsule Take 1 capsule (40 mg total) by mouth in the morning and at bedtime. 03/23/21   [name], PA-C  ondansetron (ZOFRAN ODT) 8 MG disintegrating tablet Take 1 tablet (8 mg total) by mouth every 8 (eight) hours as needed for nausea or vomiting. Patient not taking: Reported on 03/31/2021 03/10/21   Roxan Hockey, MD  oxyCODONE (OXY IR/ROXICODONE) 5 MG immediate release tablet Take 1 tablet (5 mg total) by mouth every 4 (four) hours as needed for moderate pain, severe pain or breakthrough pain. 04/02/21   Manuella Ghazi, Pratik D, DO  potassium chloride (KLOR-CON) 10 MEQ tablet Take 10 mEq by mouth daily. 10/21/20   [provider]  traZODone (DESYREL) 50 MG tablet Take 1 tablet (50 mg total) by mouth at bedtime. 03/10/21   Roxan Hockey, MD    Allergies    Amoxicillin, Dexamethasone, Doxycycline, Penicillins, Ceftriaxone sodium in dextrose, Augmentin [amoxicillin-pot clavulanate], Clavulanic acid, Adhesive [tape], Latex, Other, and Pineapple  Review of Systems   Review of Systems  Constitutional: Negative for chills and fever.  HENT: Negative for congestion and tinnitus.   Respiratory: Negative for shortness of breath.   Cardiovascular: Positive for chest pain.  Gastrointestinal: Positive for nausea. Negative for abdominal pain, diarrhea and vomiting.  Genitourinary: Negative for enuresis.  Musculoskeletal: Negative for back pain.  Skin: Negative for rash.  Neurological: Positive for headaches. Negative for dizziness.  Hematological: Does not bruise/bleed easily.    Physical Exam Updated Vital Signs BP (!) 168/122   Pulse 100   Temp 98.7 F (37.1 C)   Resp 18   Ht 4' 11"  (1.499 m)   Wt 58.1 kg   SpO2 100%   BMI 25.85 kg/m   Physical Exam Vitals and nursing note reviewed.  Constitutional:      General: She is not in acute distress.    Appearance: She is not ill-appearing.  HENT:     Head: Normocephalic and  atraumatic.     Nose: No congestion.  Eyes:     Extraocular Movements: Extraocular movements intact.     Conjunctiva/sclera: Conjunctivae normal.  Cardiovascular:     Rate and Rhythm: Regular rhythm. Tachycardia present.     Pulses: Normal pulses.     Heart sounds: No murmur heard. No friction rub. No gallop.   Pulmonary:     Effort: No respiratory distress.     Breath sounds: No wheezing, rhonchi or rales.  Abdominal:  Palpations: Abdomen is soft.     Tenderness: There is no abdominal tenderness.  Musculoskeletal:     Right lower leg: No edema.     Left lower leg: No edema.  Skin:    General: Skin is warm and dry.     Comments: Patient's right arm has a PICC line, there is no surrounding erythema or edema, no fluctuant indurations present.  PICC line appears to be in place.  Neurological:     Mental Status: She is alert.     GCS: GCS eye subscore is 4. GCS verbal subscore is 5. GCS motor subscore is 6.     Cranial Nerves: No facial asymmetry.     Coordination: Romberg sign negative. Finger-Nose-Finger Test normal.     Comments: Current nerves II through XII are grossly intact  Patient have no difficulty word finding.  Psychiatric:        Mood and Affect: Mood normal.     ED Results / Procedures / Treatments   Labs (all labs ordered are listed, but only abnormal results are displayed) Labs Reviewed  CBC WITH DIFFERENTIAL/PLATELET - Abnormal; Notable for the following components:      Result Value   RBC 3.64 (*)    Hemoglobin 10.9 (*)    HCT 32.2 (*)    All other components within normal limits  COMPREHENSIVE METABOLIC PANEL  TROPONIN I (HIGH SENSITIVITY)    EKG None  Radiology DG Chest Port 1 View  Result Date: 04/06/2021 CLINICAL DATA:  Chest pain EXAM: PORTABLE CHEST 1 VIEW COMPARISON:  03/31/2021 FINDINGS: Right upper extremity central venous catheter tip over the SVC. No focal opacity or pleural effusion. Normal cardiomediastinal silhouette. No  pneumothorax. IMPRESSION: No active disease. Right upper extremity central venous catheter tip overlies the distal SVC Electronically Signed   By: Donavan Foil M.D.   On: 04/06/2021 18:22    Procedures Procedures   Medications Ordered in ED Medications  sodium chloride 0.9 % bolus 1,000 mL (has no administration in time range)  metoCLOPramide (REGLAN) injection 10 mg (has no administration in time range)  ondansetron (ZOFRAN) injection 4 mg (has no administration in time range)  diphenhydrAMINE (BENADRYL) injection 12.5 mg (has no administration in time range)    ED Course  I have reviewed the triage vital signs and the nursing notes.  Pertinent labs & imaging results that were available during my care of the patient were reviewed by me and considered in my medical decision making (see chart for details).    MDM Rules/Calculators/A&P                         Initial impression-patient presents with a blocked PICC line as well as headaches and chest pain.  She is alert, does not appear in acute distress, vital signs notable for tachycardia.  Will attempt to unblock PICC line, obtain chest pain work-up, provide patient with migraine cocktail and reassess  Work-up-unable to obtain lab work as patient is refusing  Reassessment-nurse was able to unblock PICC line with a hard flush.  Nurse notified me that patient is refusing lab work and would like to go home.  I spoke with the patient explained that I cannot medically clear her unless I get these lab works, patient states she feels fine and rather just go home.  She is mainly here due to concerned of the blocked PICC line.  She will come back if needed.  Rule out- low suspicion for  intracranial head bleeding or mass as is no neurodeficits on my exam, etiology more consistent with a migraine.  Low suspicion for systemic infection as patient is nontoxic-appearing, vital signs reassuring.  Patient is noted to be tachycardic but I suspect is  secondary due to pain.  Low suspicion for infection at the PICC line as there is no surrounding erythema or edema, no fluctuant indurations present.  Plan- We discussed the nature and purpose, risks and benefits, as well as, the alternatives of treatment. Time was given to allow the opportunity to ask questions and consider their options, and after the discussion, the patient decided to refuse the offerred treatment. The patient was informed that refusal could lead to, but was not limited to, death, permanent disability, or severe pain. If present, I asked the relatives or significant others to dissuade them without success. Prior to refusing, I determined that the patient had the capacity to make their decision and understood the consequences of that decision. After refusal, I made every reasonable opportunity to treat them to the best of my ability.  The patient was notified that they may return to the emergency department at any time for further treatment.        Final Clinical Impression(s) / ED Diagnoses Final diagnoses:  Occluded PICC line, sequela  Atypical chest pain  Bad headache    Rx / DC Orders ED Discharge Orders    None       Marcello Fennel, PA-C 04/06/21 1827    Truddie Hidden, MD 04/06/21 2330

## 2021-04-07 ENCOUNTER — Emergency Department (HOSPITAL_COMMUNITY): Payer: Self-pay

## 2021-04-07 ENCOUNTER — Encounter: Payer: Self-pay | Admitting: Infectious Disease

## 2021-04-07 ENCOUNTER — Encounter (HOSPITAL_COMMUNITY): Payer: Self-pay | Admitting: Emergency Medicine

## 2021-04-07 ENCOUNTER — Ambulatory Visit (INDEPENDENT_AMBULATORY_CARE_PROVIDER_SITE_OTHER): Payer: Self-pay | Admitting: Infectious Disease

## 2021-04-07 ENCOUNTER — Emergency Department (HOSPITAL_COMMUNITY)
Admission: EM | Admit: 2021-04-07 | Discharge: 2021-04-08 | Disposition: A | Discharge status: Home / Self Care | Payer: Self-pay | Attending: Emergency Medicine | Admitting: Emergency Medicine

## 2021-04-07 ENCOUNTER — Other Ambulatory Visit (HOSPITAL_COMMUNITY): Payer: Self-pay

## 2021-04-07 ENCOUNTER — Other Ambulatory Visit: Payer: Self-pay | Admitting: Infectious Disease

## 2021-04-07 ENCOUNTER — Emergency Department (HOSPITAL_BASED_OUTPATIENT_CLINIC_OR_DEPARTMENT_OTHER): Payer: Self-pay

## 2021-04-07 ENCOUNTER — Encounter (HOSPITAL_COMMUNITY): Payer: Self-pay | Admitting: *Deleted

## 2021-04-07 ENCOUNTER — Telehealth: Payer: Self-pay

## 2021-04-07 ENCOUNTER — Emergency Department (HOSPITAL_COMMUNITY)
Admission: EM | Admit: 2021-04-07 | Discharge: 2021-04-07 | Disposition: A | Discharge status: Home / Self Care | Payer: Self-pay | Attending: Physician Assistant | Admitting: Physician Assistant

## 2021-04-07 ENCOUNTER — Other Ambulatory Visit: Payer: Self-pay

## 2021-04-07 VITALS — BP 167/106 | HR 103 | Temp 98.0°F | Wt 128.0 lb

## 2021-04-07 DIAGNOSIS — J45909 Unspecified asthma, uncomplicated: Secondary | ICD-10-CM | POA: Insufficient documentation

## 2021-04-07 DIAGNOSIS — Z5321 Procedure and treatment not carried out due to patient leaving prior to being seen by health care provider: Secondary | ICD-10-CM | POA: Insufficient documentation

## 2021-04-07 DIAGNOSIS — B9561 Methicillin susceptible Staphylococcus aureus infection as the cause of diseases classified elsewhere: Secondary | ICD-10-CM

## 2021-04-07 DIAGNOSIS — Z794 Long term (current) use of insulin: Secondary | ICD-10-CM | POA: Insufficient documentation

## 2021-04-07 DIAGNOSIS — R000 Tachycardia, unspecified: Secondary | ICD-10-CM | POA: Insufficient documentation

## 2021-04-07 DIAGNOSIS — R0789 Other chest pain: Secondary | ICD-10-CM

## 2021-04-07 DIAGNOSIS — E1040 Type 1 diabetes mellitus with diabetic neuropathy, unspecified: Secondary | ICD-10-CM | POA: Insufficient documentation

## 2021-04-07 DIAGNOSIS — Z91040 Latex allergy status: Secondary | ICD-10-CM | POA: Insufficient documentation

## 2021-04-07 DIAGNOSIS — M79601 Pain in right arm: Secondary | ICD-10-CM | POA: Insufficient documentation

## 2021-04-07 DIAGNOSIS — M4646 Discitis, unspecified, lumbar region: Secondary | ICD-10-CM

## 2021-04-07 DIAGNOSIS — R079 Chest pain, unspecified: Secondary | ICD-10-CM | POA: Insufficient documentation

## 2021-04-07 DIAGNOSIS — I82621 Acute embolism and thrombosis of deep veins of right upper extremity: Secondary | ICD-10-CM

## 2021-04-07 DIAGNOSIS — E1065 Type 1 diabetes mellitus with hyperglycemia: Secondary | ICD-10-CM | POA: Insufficient documentation

## 2021-04-07 DIAGNOSIS — R7881 Bacteremia: Secondary | ICD-10-CM

## 2021-04-07 DIAGNOSIS — Z452 Encounter for adjustment and management of vascular access device: Secondary | ICD-10-CM | POA: Insufficient documentation

## 2021-04-07 DIAGNOSIS — R519 Headache, unspecified: Secondary | ICD-10-CM | POA: Insufficient documentation

## 2021-04-07 DIAGNOSIS — I10 Essential (primary) hypertension: Secondary | ICD-10-CM | POA: Insufficient documentation

## 2021-04-07 DIAGNOSIS — Z79899 Other long term (current) drug therapy: Secondary | ICD-10-CM | POA: Insufficient documentation

## 2021-04-07 DIAGNOSIS — M7989 Other specified soft tissue disorders: Secondary | ICD-10-CM

## 2021-04-07 DIAGNOSIS — R072 Precordial pain: Secondary | ICD-10-CM | POA: Insufficient documentation

## 2021-04-07 DIAGNOSIS — R739 Hyperglycemia, unspecified: Secondary | ICD-10-CM

## 2021-04-07 HISTORY — DX: Methicillin susceptible Staphylococcus aureus infection as the cause of diseases classified elsewhere: B95.61

## 2021-04-07 HISTORY — DX: Acute embolism and thrombosis of deep veins of right upper extremity: I82.621

## 2021-04-07 LAB — CBC WITH DIFFERENTIAL/PLATELET
Abs Immature Granulocytes: 0.01 10*3/uL (ref 0.00–0.07)
Basophils Absolute: 0.1 10*3/uL (ref 0.0–0.1)
Basophils Relative: 1 %
Eosinophils Absolute: 0.6 10*3/uL — ABNORMAL HIGH (ref 0.0–0.5)
Eosinophils Relative: 9 %
HCT: 30.4 % — ABNORMAL LOW (ref 36.0–46.0)
Hemoglobin: 10.3 g/dL — ABNORMAL LOW (ref 12.0–15.0)
Immature Granulocytes: 0 %
Lymphocytes Relative: 34 %
Lymphs Abs: 2.1 10*3/uL (ref 0.7–4.0)
MCH: 30 pg (ref 26.0–34.0)
MCHC: 33.9 g/dL (ref 30.0–36.0)
MCV: 88.6 fL (ref 80.0–100.0)
Monocytes Absolute: 0.5 10*3/uL (ref 0.1–1.0)
Monocytes Relative: 8 %
Neutro Abs: 3 10*3/uL (ref 1.7–7.7)
Neutrophils Relative %: 48 %
Platelets: 423 10*3/uL — ABNORMAL HIGH (ref 150–400)
RBC: 3.43 MIL/uL — ABNORMAL LOW (ref 3.87–5.11)
RDW: 12.3 % (ref 11.5–15.5)
WBC: 6.2 10*3/uL (ref 4.0–10.5)
nRBC: 0 % (ref 0.0–0.2)

## 2021-04-07 LAB — COMPREHENSIVE METABOLIC PANEL
ALT: 9 U/L (ref 0–44)
AST: 14 U/L — ABNORMAL LOW (ref 15–41)
Albumin: 2.1 g/dL — ABNORMAL LOW (ref 3.5–5.0)
Alkaline Phosphatase: 94 U/L (ref 38–126)
Anion gap: 10 (ref 5–15)
BUN: 8 mg/dL (ref 6–20)
CO2: 27 mmol/L (ref 22–32)
Calcium: 7.9 mg/dL — ABNORMAL LOW (ref 8.9–10.3)
Chloride: 95 mmol/L — ABNORMAL LOW (ref 98–111)
Creatinine, Ser: 1.24 mg/dL — ABNORMAL HIGH (ref 0.44–1.00)
GFR, Estimated: >60 mL/min (ref >60)
Glucose, Bld: 546 mg/dL (ref 70–99)
Potassium: 3.7 mmol/L (ref 3.5–5.1)
Sodium: 132 mmol/L — ABNORMAL LOW (ref 135–145)
Total Bilirubin: 0.6 mg/dL (ref 0.3–1.2)
Total Protein: 6.2 g/dL — ABNORMAL LOW (ref 6.5–8.1)

## 2021-04-07 LAB — PROTEIN ELECTROPHORESIS, SERUM
A/G Ratio: 0.6 — ABNORMAL LOW (ref 0.7–1.7)
Albumin ELP: 2.1 g/dL — ABNORMAL LOW (ref 2.9–4.4)
Alpha-1-Globulin: 0.2 g/dL (ref 0.0–0.4)
Alpha-2-Globulin: 1 g/dL (ref 0.4–1.0)
Beta Globulin: 1 g/dL (ref 0.7–1.3)
Gamma Globulin: 1.6 g/dL (ref 0.4–1.8)
Globulin, Total: 3.8 g/dL (ref 2.2–3.9)
Total Protein ELP: 5.9 g/dL — ABNORMAL LOW (ref 6.0–8.5)

## 2021-04-07 LAB — IMMUNOFIXATION, URINE

## 2021-04-07 MED ORDER — IOHEXOL 350 MG/ML SOLN
100.0000 mL | Freq: Once | INTRAVENOUS | Status: AC | PRN
  Administered 2021-04-07: 100 mL via INTRAVENOUS

## 2021-04-07 MED ORDER — DIPHENHYDRAMINE HCL 50 MG/ML IJ SOLN
50.0000 mg | Freq: Once | INTRAMUSCULAR | Status: AC
  Administered 2021-04-07: 50 mg via INTRAVENOUS
  Filled 2021-04-07: qty 1

## 2021-04-07 MED ORDER — INSULIN GLARGINE 100 UNIT/ML ~~LOC~~ SOLN: 40.0000 [IU] | Freq: Every day | SUBCUTANEOUS | Status: DC

## 2021-04-07 MED ORDER — CEPHALEXIN 500 MG PO CAPS: 500.0000 mg | ORAL_CAPSULE | Freq: Four times a day (QID) | ORAL | 0 refills | Status: AC

## 2021-04-07 MED ORDER — CEPHALEXIN 500 MG PO CAPS: 500.0000 mg | ORAL_CAPSULE | Freq: Four times a day (QID) | ORAL | 0 refills | Status: DC

## 2021-04-07 MED ORDER — INSULIN GLARGINE 100 UNIT/ML ~~LOC~~ SOLN
40.0000 [IU] | Freq: Once | SUBCUTANEOUS | Status: AC
  Administered 2021-04-07: 40 [IU] via SUBCUTANEOUS
  Filled 2021-04-07: qty 0.4

## 2021-04-07 MED ORDER — SODIUM CHLORIDE 0.9 % IV BOLUS
1000.0000 mL | Freq: Once | INTRAVENOUS | Status: AC
  Administered 2021-04-07: 1000 mL via INTRAVENOUS

## 2021-04-07 MED ORDER — METOCLOPRAMIDE HCL 5 MG/ML IJ SOLN
10.0000 mg | Freq: Once | INTRAMUSCULAR | Status: AC
  Administered 2021-04-07: 10 mg via INTRAVENOUS
  Filled 2021-04-07: qty 2

## 2021-04-07 MED ORDER — SULFAMETHOXAZOLE-TRIMETHOPRIM 800-160 MG PO TABS: 1.0000 | ORAL_TABLET | Freq: Two times a day (BID) | ORAL | 0 refills | Status: DC

## 2021-04-07 NOTE — ED Notes (Signed)
Date and time results received: 04/07/21 2136   Test: GLUCOSE Critical Value: 546  Name of Provider Notified: Noemi Chapel MD

## 2021-04-07 NOTE — ED Notes (Signed)
Patient transported to CT 

## 2021-04-07 NOTE — ED Triage Notes (Signed)
Pt sent over By Dr Tommy Medal to have picc line taken out and have arm looked at for possible clot

## 2021-04-07 NOTE — Discharge Instructions (Addendum)
You came to the emergency department today to be evaluated for your right arm pain, chest pain, and headache.  The ultrasound he had performed earlier today showed no signs of a blood clot in your right arm.  The CT scan of your chest showed no blood clots in your lungs.  Your lab work did show that your blood sugar was high.  Please take your insulin medication as prescribed and follow diet prescribed by your primary care provider.  We have started you on an oral antibiotic Keflex.  Please take 1 pill 4 times a day for the next 30 days.  You may stop taking your IV antibiotics Ancef.  Please follow-up with your infectious disease physician Dr. Tommy Medal.    Get help right away if: Your blood glucose monitor reads "high" even when you are taking insulin. You have trouble breathing. You have a change in how you think, feel, or act (mental status). You have nausea or vomiting that does not go away

## 2021-04-07 NOTE — Progress Notes (Signed)
Subjective:  Chief complaint arm pain around PICC line   Patient ID: Diamond [name], female    DOB: [birth year], 25 y.o.   MRN: [id number]  HPI  25 year old black woman living with diabetes mellitus who had severe low back pain and was found to have MSSA bacteremia and multifocal pneumonia.  Transesophageal echocardiogram failed to show evidence of endocarditis and imaging of the spine failed to show evidence of discitis.  We decided to err on the side of treating her for presumptive discitis with cefazolin.  She received the cefazolin but not for the entire duration we had desired because apparently [facility] would no longer pay for medications.  In the interim she was having trouble with her PICC line not flushing.  She came to the ER yesterday to have this evaluated but then left before any substantive evaluation could be done.  She presented to clinic today without an appointment but we saw her as a work in.  On exam she clearly has a tender painful area proximal to her PICC line which is very concerning for a DVT.  She also has been complaining of intermittent burning chest pain as well.  I directed her to go to the emergency department so this can be evaluated and that she can be started on blood thinners as I am certain she has a deep venous thrombosis.  Past Medical History:  Diagnosis Date  . ADHD (attention deficit hyperactivity disorder) 2007  . Allergy   . Arm DVT (deep venous thromboembolism), acute, right ([place]) 04/07/2021  . Asthma   . Bipolar disorder ([place])   . Crohn's colitis ([place])    Dx Dr. Cherrie Gauze  . Depression    early childhood, trial of seroquel?  . Diabetic neuropathy ([place])   . Edema   . GERD (gastroesophageal reflux disease)   . Hypertension   . MSSA bacteremia 04/07/2021  . Pancreatitis   . Panic attacks   . PTSD (post-traumatic stress disorder)   . Stroke ([place]) 07/2020   R side  . Type 1 diabetes ([place]) 2008    Past Surgical History:   Procedure Laterality Date  . ADENOIDECTOMY    . BIOPSY  02/01/2021   Procedure: BIOPSY;  Surgeon: Rogene Houston, MD;  [facility];  Service: Endoscopy;;  duodenal, esophageal,  . BIOPSY  02/03/2021   Procedure: BIOPSY;  Surgeon: Eloise Harman, DO;  [facility]: [facility];  Service: Endoscopy;;  . COLONOSCOPY WITH PROPOFOL N/A 02/01/2021   Procedure: COLONOSCOPY WITH PROPOFOL;  Surgeon: Rogene Houston, MD;  [facility]: [facility];  Service: Endoscopy;  Laterality: N/A;  . COLONOSCOPY WITH PROPOFOL N/A 02/03/2021   Procedure: COLONOSCOPY WITH PROPOFOL;  Surgeon: Eloise Harman, DO;  [facility];  Service: Endoscopy;  Laterality: N/A;  . ESOPHAGOGASTRODUODENOSCOPY (EGD) WITH PROPOFOL N/A 02/01/2021   Procedure: ESOPHAGOGASTRODUODENOSCOPY (EGD) WITH PROPOFOL;  Surgeon: Rogene Houston, MD;  [facility];  Service: Endoscopy;  Laterality: N/A;  . HERNIA REPAIR    . TEE WITHOUT CARDIOVERSION N/A 03/09/2021   Procedure: TRANSESOPHAGEAL ECHOCARDIOGRAM (TEE) WITH PROPOFOL;  Surgeon: Arnoldo Lenis, MD;  Location: [facility];  Service: Endoscopy;  Laterality: N/A;  . TONSILLECTOMY      Family History  Problem Relation Age of Onset  . Hypertension Mother   . Diabetes Mother   . Heart disease Mother   . Crohn's disease Father   . Alcohol abuse Father   . Drug abuse  Father   . Mental illness Father   . Cancer Father   . Hypertension Maternal Aunt   . Seizures Maternal Aunt   . Depression Maternal Aunt   . Asthma Maternal Uncle   . Hypertension Maternal Uncle   . Cancer Maternal Grandmother   . Diabetes Maternal Grandmother   . Mental illness Maternal Grandfather       Social History   Socioeconomic History  . Marital status: Single    Spouse name: Not on file  . Number of children: Not on file  . Years of education: Not on file  . Highest education level: Not on file  Occupational History  . Not on file  Tobacco Use  . Smoking  status: Never Smoker  . Smokeless tobacco: Never Used  Vaping Use  . Vaping Use: Never used  Substance and Sexual Activity  . Alcohol use: No  . Drug use: No  . Sexual activity: Yes  Other Topics Concern  . Not on file  Social History Narrative  . Not on file   Social Determinants of Health   Financial Resource Strain: Not on file  Food Insecurity: Not on file  Transportation Needs: Not on file  Physical Activity: Not on file  Stress: Not on file  Social Connections: Not on file    Allergies  Allergen Reactions  . Amoxicillin Hives    Did it involve swelling of the face/tongue/throat, SOB, or low BP? no  Did it involve sudden or severe rash/hives, skin peeling, or any reaction on the inside of your mouth or nose? no Did you need to seek medical attention at a hospital or doctor's office? no When did it last happen?unk If all above answers are "NO", may proceed with cephalosporin use.  Marland Kitchen Dexamethasone Hives and Itching  . Doxycycline Itching and Swelling  . Penicillins Hives and Itching    Did it involve swelling of the face/tongue/throat, SOB, or low BP? no  Did it involve sudden or severe rash/hives, skin peeling, or any reaction on the inside of your mouth or nose? no Did you need to seek medical attention at a hospital or doctor's office? no When did it last happen?unk If all above answers are "NO", may proceed with cephalosporin use. Did it involve swelling of the face/tongue/throat, SOB, or low BP? no  Did it involve sudden or severe rash/hives, skin peeling, or any reac  . Ceftriaxone Sodium In Dextrose Itching  . Augmentin [Amoxicillin-Pot Clavulanate] Other (See Comments)    Reaction unknown  . Clavulanic Acid   . Adhesive [Tape] Itching and Rash  . Latex Itching and Rash  . Other Itching and Rash  . Pineapple Rash    Rash on tongue and throat     Current Outpatient Medications:  .  acetaminophen (TYLENOL) 500 MG tablet, Take by mouth., Disp:  , Rfl:  .  albuterol (VENTOLIN HFA) 108 (90 Base) MCG/ACT inhaler, Inhale 2 puffs into the lungs every 6 (six) hours as needed for wheezing or shortness of breath. For shortness of breath, Disp: 3 each, Rfl: 0 .  ceFAZolin (ANCEF) IVPB, Inject 2 g into the vein every 8 (eight) hours. Indication:  Bacteremia, possible discitis First Dose: No Last Day of Therapy:  04/17/21 Labs - Once weekly:  CBC/D and BMP, Labs - Every other week:  ESR and CRP Method of administration: IV Push Method of administration may be changed at the discretion of home infusion pharmacist based upon assessment of the patient and/or caregiver's  ability to self-administer the medication ordered., Disp: 117 Units, Rfl: 0 .  ciprofloxacin (CIPRO) 500 MG tablet, Take 1 tablet (500 mg total) by mouth 2 (two) times daily for 8 days., Disp: 16 tablet, Rfl: 0 .  citalopram (CELEXA) 10 MG tablet, Take 10 mg by mouth daily., Disp: , Rfl:  .  diphenhydrAMINE (BENADRYL) 12.5 MG/5ML elixir, Take 10 mLs (25 mg total) by mouth every 6 (six) hours as needed for itching, allergies or sleep., Disp: 180 mL, Rfl: 0 .  furosemide (LASIX) 20 MG tablet, Take 20 mg by mouth daily., Disp: , Rfl:  .  gabapentin (NEURONTIN) 100 MG capsule, Take 1 capsule (100 mg total) by mouth 2 (two) times daily., Disp: 180 capsule, Rfl: 5 .  hydrALAZINE (APRESOLINE) 25 MG tablet, Take 1 tablet (25 mg total) by mouth 2 (two) times daily., Disp: 90 tablet, Rfl: 3 .  hydrochlorothiazide (HYDRODIURIL) 25 MG tablet, Take 25 mg by mouth daily., Disp: , Rfl:  .  insulin glargine (LANTUS) 100 UNIT/ML injection, Inject 40 Units into the skin at bedtime., Disp: , Rfl:  .  Insulin Glulisine (APIDRA [place]), Inject 10-16 Units into the skin 3 (three) times daily before meals. ssi, Disp: , Rfl:  .  isosorbide mononitrate (IMDUR) 30 MG 24 hr tablet, Take 0.5 tablets (15 mg total) by mouth daily., Disp: , Rfl:  .  loperamide (IMODIUM A-D) 2 MG tablet, Take 1 tablet (2 mg total) by mouth 4  (four) times daily as needed for diarrhea or loose stools., Disp: 30 tablet, Rfl: 0 .  metoCLOPramide (REGLAN) 5 MG tablet, Take 1 tablet (5 mg total) by mouth every 8 (eight) hours as needed for refractory nausea / vomiting (refractory symptoms)., Disp: 90 tablet, Rfl: 0 .  metoprolol tartrate (LOPRESSOR) 50 MG tablet, Take 1 tablet (50 mg total) by mouth 2 (two) times daily., Disp: 180 tablet, Rfl: 0 .  omeprazole (PRILOSEC) 40 MG capsule, Take 1 capsule (40 mg total) by mouth in the morning and at bedtime., Disp: 18 capsule, Rfl: 0 .  ondansetron (ZOFRAN ODT) 8 MG disintegrating tablet, Take 1 tablet (8 mg total) by mouth every 8 (eight) hours as needed for nausea or vomiting., Disp: 20 tablet, Rfl: 2 .  oxyCODONE (OXY IR/ROXICODONE) 5 MG immediate release tablet, Take 1 tablet (5 mg total) by mouth every 4 (four) hours as needed for moderate pain, severe pain or breakthrough pain., Disp: 10 tablet, Rfl: 0 .  potassium chloride (KLOR-CON) 10 MEQ tablet, Take 10 mEq by mouth daily., Disp: , Rfl:  .  traZODone (DESYREL) 50 MG tablet, Take 1 tablet (50 mg total) by mouth at bedtime., Disp: 30 tablet, Rfl: 1   Review of Systems  Constitutional: Negative for activity change, appetite change, chills, diaphoresis, fatigue, fever and unexpected weight change.  HENT: Negative for congestion, rhinorrhea, sinus pressure, sneezing, sore throat and trouble swallowing.   Eyes: Negative for photophobia and visual disturbance.  Respiratory: Negative for cough, chest tightness, shortness of breath, wheezing and stridor.   Cardiovascular: Negative for chest pain, palpitations and leg swelling.  Gastrointestinal: Negative for abdominal distention, abdominal pain, anal bleeding, blood in stool, constipation, diarrhea, nausea and vomiting.  Genitourinary: Negative for difficulty urinating, dysuria, flank pain and hematuria.  Musculoskeletal: Positive for back pain and joint swelling. Negative for arthralgias, gait  problem and myalgias.  Skin: Negative for color change, pallor, rash and wound.  Neurological: Negative for dizziness, tremors, weakness and light-headedness.  Hematological: Negative for adenopathy. Does not  bruise/bleed easily.  Psychiatric/Behavioral: Negative for agitation, behavioral problems, confusion, decreased concentration, dysphoric mood and sleep disturbance.       Objective:   Physical Exam Constitutional:      General: She is not in acute distress.    Appearance: Normal appearance. She is well-developed. She is not ill-appearing or diaphoretic.  HENT:     Head: Normocephalic and atraumatic.     Right Ear: Hearing and external ear normal.     Left Ear: Hearing and external ear normal.     Nose: No nasal deformity or rhinorrhea.  Eyes:     General: No scleral icterus.    Conjunctiva/sclera: Conjunctivae normal.     Right eye: Right conjunctiva is not injected.     Left eye: Left conjunctiva is not injected.     Pupils: Pupils are equal, round, and reactive to light.  Neck:     Vascular: No JVD.  Cardiovascular:     Rate and Rhythm: Normal rate and regular rhythm.     Heart sounds: Normal heart sounds, S1 normal and S2 normal. No murmur heard. No friction rub.  Pulmonary:     Effort: Pulmonary effort is normal. No respiratory distress.     Breath sounds: No stridor. No wheezing or rhonchi.  Chest:     Chest wall: No tenderness.  Abdominal:     General: Bowel sounds are normal. There is no distension.     Palpations: Abdomen is soft.  Musculoskeletal:        General: Normal range of motion.     Right shoulder: Normal.     Left shoulder: Normal.     Cervical back: Normal range of motion and neck supple.     Right hip: Normal.     Left hip: Normal.     Right knee: Normal.     Left knee: Normal.  Lymphadenopathy:     Head:     Right side of head: No submandibular, preauricular or posterior auricular adenopathy.     Left side of head: No submandibular,  preauricular or posterior auricular adenopathy.     Cervical: No cervical adenopathy.     Right cervical: No superficial or deep cervical adenopathy.    Left cervical: No superficial or deep cervical adenopathy.  Skin:    General: Skin is warm and dry.     Coloration: Skin is not pale.     Findings: No abrasion, bruising, ecchymosis, erythema, lesion or rash.     Nails: There is no clubbing.  Neurological:     Mental Status: She is alert and oriented to person, place, and time.     Sensory: No sensory deficit.     Coordination: Coordination normal.     Gait: Gait normal.  Psychiatric:        Attention and Perception: Attention normal. She is attentive.        Mood and Affect: Mood is depressed.        Speech: Speech normal.        Behavior: Behavior normal. Behavior is cooperative.        Thought Content: Thought content normal.        Judgment: Judgment normal.     Picture below there is a lot of tenderness proximal to the site.  Apr 07, 2021:           Assessment & Plan:   Likely DVT and possible pulmonary embolism I explained her this is a life-threatening emergency and that she needs to  be evaluated for DVT and likely given anticoagulation promptly.  We brought her to the emergency department subsequently.  Possible discitis: Her back pain she says is improved by 50%.  We will switch her over to either Augmentin or Keflex which ever one will be more affordable and I would like to have her take another month of this.  A plan of reevaluating at that point in time and rechecking inflammatory markers as well.  We spent greater than 40 minutes with the patient including greater than 50% of time in face to face counsel of the patient and in coordination of her care.

## 2021-04-07 NOTE — ED Triage Notes (Signed)
Pt c/o chest pain and states her doctor told her she could have her PICC line removed to look for blood clots

## 2021-04-07 NOTE — ED Provider Notes (Addendum)
[facility] EMERGENCY DEPARTMENT Provider Note   CSN: [id number] Arrival date & time: 04/07/21  1915     History Chief Complaint  Patient presents with  . Chest Pain    Diamond [name] is a 25 y.o. female.  Patient presents with chief complaint of right arm pain and chest pain.  Patient has PICC line in right upper extremity and is currently receiving Ancef 3 times daily for bacteremia and discitis.  Patient was seen in the emergency department yesterday for an occluded PICC, chest pain and headache.  Patient's PICC line was cleared.  Patient left prior to having all of her lab work obtained and resulted.  Patient was seen by infectious disease provider Dr. Drucilla Schmidt today. She was sent to the emergency department to due to concerns of possible DVT and pulmonary embolism.  No provider states that patient can be switched over to Augmentin or Keflex.  Patient reports that she was at [facility] emergency department this morning but left to come here after not being seen.  At present patient endorses chest pain, headache and pain to right upper extremity.  Patient reports headache started this morning and has gotten progressively worse.  Onset was gradual, pain not maximal in onset.  Patient reports pain is bandlike starting in her forehead wrapping around her head.  Patient rates pain 8/10 on the pain scale.  No increase in pain with exertion.  Patient endorses photophobia and increase in pain with loud noises. Patient denies any alleviating factors.  Patient denies any slurred speech, focal neurological deficits, visual disturbance.    Patient reports chest pain is located to right chest.  Describes pain as a "shock."  Pain is constant.  No alleviating or aggravating factors.  Patient rates her pain as a 7/10 on the pain scale.  No associated nausea, vomiting, shortness of breath or diaphoresis.  Patient endorses pain to right upper arm just above her PICC line site..  Patient complains of  increased pain with palpation.  Patient reports pain is constant.  Patient denies any erythema, rash or purulent drainage.  HPI     Past Medical History:  Diagnosis Date  . ADHD (attention deficit hyperactivity disorder) 2007  . Allergy   . Arm DVT (deep venous thromboembolism), acute, right ([place]) 04/07/2021  . Asthma   . Bipolar disorder ([place])   . Crohn's colitis ([place])    Dx Dr. Cherrie Gauze  . Depression    early childhood, trial of seroquel?  . Diabetic neuropathy ([place])   . Edema   . GERD (gastroesophageal reflux disease)   . Hypertension   . MSSA bacteremia 04/07/2021  . Pancreatitis   . Panic attacks   . PTSD (post-traumatic stress disorder)   . Stroke ([place]) 07/2020   R side  . Type 1 diabetes ([place]) 2008    Patient Active Problem List   Diagnosis Date Noted  . MSSA bacteremia 04/07/2021  . Arm DVT (deep venous thromboembolism), acute, right ([place]) 04/07/2021  . AKI (acute kidney injury) ([place]) 04/01/2021  . Pyelonephritis 03/31/2021  . Lumbago   . Intractable nausea and vomiting 03/16/2021  . Nausea and vomiting   . 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 Multifocal pneumonia 03/05/2021  . Bladder outlet obstruction   . Constipation   . Abdominal pain, epigastric   . Abnormal computed tomography of cecum and terminal ileum   . Hyperosmolar hyperglycemic state (HHS) ([place]) 01/30/2021  .  Crohn's disease ([place]) 12/08/2020  . Diabetic gastroparesis ([place]) 12/08/2020  . Essential hypertension, benign 11/01/2020  . Edema 10/19/2020  . Anasarca 10/18/2020  . Hypoalbuminemia-due to proteinuria/nephrotic syndrome 10/18/2020  . Personal history of noncompliance with medical treatment, presenting hazards to health 10/12/2020  . Bilateral lower extremity edema 08/25/2020  . Pneumonia 08/25/2020  . Eczema 10/23/2012  . Abdominal pain 08/27/2012  . GERD (gastroesophageal reflux disease) 07/25/2012  . Mood  disorder ([place]) 07/09/2012  . Contraception management 07/09/2012  . History of Crohn's disease 07/09/2012  . Uncontrolled type 1 diabetes mellitus with hyperglycemia and Diabetic Nephropathy 07/08/2012    Class: Chronic  . Asthma, persistent controlled 07/08/2012    Past Surgical History:  Procedure Laterality Date  . ADENOIDECTOMY    . BIOPSY  02/01/2021   Procedure: BIOPSY;  Surgeon: Rogene Houston, MD;  Location: [facility] ENDO SUITE;  Service: Endoscopy;;  duodenal, esophageal,  . BIOPSY  02/03/2021   Procedure: BIOPSY;  Surgeon: Eloise Harman, DO;  Location: [facility] ENDO SU[facility];  Service: Endoscopy;;  . COLONOSCOPY WITH PROPOFOL N/A 02/01/2021   Procedure: COLONOSCOPY WITH PROPOFOL;  Surgeon: Rogene Houston, MD;  Location: [facility];  Service: Endoscopy;  Laterality: N/A;  . COLONOSCOPY WITH PROPOFOL N/A 02/03/2021   Procedure: COLONOSCOPY WITH PROPOFOL;  Surgeon: Eloise Harman, DO;  Location: [facility];  Service: Endoscopy;  Laterality: N/A;  . ESOPHAGOGASTRODUODENOSCOPY (EGD) WITH PROPOFOL N/A 02/01/2021   Procedure: ESOPHAGOGASTRODUODENOSCOPY (EGD) WITH PROPOFOL;  Surgeon: Rogene Houston, MD;  Location: [facility];  Service: Endoscopy;  Laterality: N/A;  . HERNIA REPAIR    . TEE WITHOUT CARDIOVERSION N/A 03/09/2021   Procedure: TRANSESOPHAGEAL ECHOCARDIOGRAM (TEE) WITH PROPOFOL;  Surgeon: Arnoldo Lenis, MD;  Location: [facility];  Service: Endoscopy;  Laterality: N/A;  . TONSILLECTOMY       OB History   No obstetric history on file.     Family History  Problem Relation Age of Onset  . Hypertension Mother   . Diabetes Mother   . Heart disease Mother   . Crohn's disease Father   . Alcohol abuse Father   . Drug abuse Father   . Mental illness Father   . Cancer Father   . Hypertension Maternal Aunt   . Seizures Maternal Aunt   . Depression Maternal Aunt   . Asthma Maternal Uncle   . Hypertension Maternal Uncle   . Cancer Maternal Grandmother   . Diabetes  Maternal Grandmother   . Mental illness Maternal Grandfather     Social History   Tobacco Use  . Smoking status: Never Smoker  . Smokeless tobacco: Never Used  Vaping Use  . Vaping Use: Never used  Substance Use Topics  . Alcohol use: No  . Drug use: No    Home Medications Prior to Admission medications   Medication Sig Start Date End Date Taking? Authorizing Provider  acetaminophen (TYLENOL) 500 MG tablet Take by mouth. 04/21/20   [provider]  albuterol (VENTOLIN HFA) 108 (90 Base) MCG/ACT inhaler Inhale 2 puffs into the lungs every 6 (six) hours as needed for wheezing or shortness of breath. For shortness of breath 03/10/21   Roxan Hockey, MD  ceFAZolin (ANCEF) IVPB Inject 2 g into the vein every 8 (eight) hours. Indication:  Bacteremia, possible discitis First Dose: No Last Day of Therapy:  04/17/21 Labs - Once weekly:  CBC/D and BMP, Labs - Every other week:  ESR and CRP Method of administration: IV Push  Method of administration may be changed at the discretion of home infusion pharmacist based upon assessment of the patient and/or caregiver's ability to self-administer the medication ordered. 03/10/21 04/18/21  Roxan Hockey, MD  ciprofloxacin (CIPRO) 500 MG tablet Take 1 tablet (500 mg total) by mouth 2 (two) times daily for 8 days. 04/02/21 04/10/21  Manuella Ghazi, Pratik D, DO  citalopram (CELEXA) 10 MG tablet Take 10 mg by mouth daily.    [provider]  diphenhydrAMINE (BENADRYL) 12.5 MG/5ML elixir Take 10 mLs (25 mg total) by mouth every 6 (six) hours as needed for itching, allergies or sleep. 03/10/21   Roxan Hockey, MD  furosemide (LASIX) 20 MG tablet Take 20 mg by mouth daily. 04/25/20   [provider]  gabapentin (NEURONTIN) 100 MG capsule Take 1 capsule (100 mg total) by mouth 2 (two) times daily. 03/10/21   Roxan Hockey, MD  hydrALAZINE (APRESOLINE) 25 MG tablet Take 1 tablet (25 mg total) by mouth 2 (two) times daily. 03/10/21   Roxan Hockey, MD  hydrochlorothiazide (HYDRODIURIL) 25 MG tablet Take 25 mg by mouth daily. 02/11/20   [provider]  insulin glargine (LANTUS) 100 UNIT/ML injection Inject 40 Units into the skin at bedtime.    [provider]  Insulin Glulisine (APIDRA [place]) Inject 10-16 Units into the skin 3 (three) times daily before meals. ssi    [provider]  isosorbide mononitrate (IMDUR) 30 MG 24 hr tablet Take 0.5 tablets (15 mg total) by mouth daily. 03/19/21   Barton Dubois, MD  loperamide (IMODIUM A-D) 2 MG tablet Take 1 tablet (2 mg total) by mouth 4 (four) times daily as needed for diarrhea or loose stools. 04/02/21   Manuella Ghazi, Pratik D, DO  metoCLOPramide (REGLAN) 5 MG tablet Take 1 tablet (5 mg total) by mouth every 8 (eight) hours as needed for refractory nausea / vomiting (refractory symptoms). 03/19/21 03/19/22  Barton Dubois, MD  metoprolol tartrate (LOPRESSOR) 50 MG tablet Take 1 tablet (50 mg total) by mouth 2 (two) times daily. 03/23/21   [name], PA-C  omeprazole (PRILOSEC) 40 MG capsule Take 1 capsule (40 mg total) by mouth in the morning and at bedtime. 03/23/21   [name], PA-C  ondansetron (ZOFRAN ODT) 8 MG disintegrating tablet Take 1 tablet (8 mg total) by mouth every 8 (eight) hours as needed for nausea or vomiting. 03/10/21   Roxan Hockey, MD  oxyCODONE (OXY IR/ROXICODONE) 5 MG immediate release tablet Take 1 tablet (5 mg total) by mouth every 4 (four) hours as needed for moderate pain, severe pain or breakthrough pain. 04/02/21   Manuella Ghazi, Pratik D, DO  potassium chloride (KLOR-CON) 10 MEQ tablet Take 10 mEq by mouth daily. 10/21/20   [provider]  traZODone (DESYREL) 50 MG tablet Take 1 tablet (50 mg total) by mouth at bedtime. 03/10/21   Roxan Hockey, MD    Allergies    Amoxicillin, Dexamethasone, Doxycycline, Penicillins, Ceftriaxone sodium in dextrose, Augmentin [amoxicillin-pot clavulanate], Clavulanic acid, Adhesive [tape], Latex, Other,  and Pineapple  Review of Systems   Review of Systems  Constitutional: Negative for chills and fever.  Eyes: Positive for photophobia. Negative for visual disturbance.  Respiratory: Negative for shortness of breath.   Cardiovascular: Positive for chest pain and leg swelling. Negative for palpitations.  Gastrointestinal: Positive for diarrhea (since being started on Ancef). Negative for abdominal pain, nausea and vomiting.  Genitourinary: Negative for difficulty urinating and dysuria.  Musculoskeletal: Positive for back pain (baseline for patient since being  dx with discitis ). Negative for neck pain and neck stiffness.  Skin: Negative for color change, pallor and wound.  Neurological: Positive for headaches. Negative for dizziness, tremors, seizures, syncope, facial asymmetry, speech difficulty, weakness, light-headedness and numbness.  Psychiatric/Behavioral: Negative for confusion.    Physical Exam Updated Vital Signs BP 125/87   Pulse (!) 106   Temp 98.9 F (37.2 C) (Oral)   Resp 16   Ht _0  (1.499 m)   Wt 58.5 kg   SpO2 100%   BMI 26.03 kg/m   Physical Exam Vitals and nursing note reviewed.  Constitutional:      General: She is not in acute distress.    Appearance: She is not ill-appearing, toxic-appearing or diaphoretic.  HENT:     Head: Normocephalic and atraumatic. No raccoon eyes, abrasion, contusion, masses, right periorbital erythema, left periorbital erythema or laceration.     Jaw: No trismus or pain on movement.     Mouth/Throat:     Pharynx: Oropharynx is clear. Uvula midline. No pharyngeal swelling, oropharyngeal exudate, posterior oropharyngeal erythema or uvula swelling.  Eyes:     General: No scleral icterus.       Right eye: No discharge.        Left eye: No discharge.     Extraocular Movements: Extraocular movements intact.     Pupils: Pupils are equal, round, and reactive to light.  Cardiovascular:     Rate and Rhythm: Tachycardia present.      Heart sounds: Normal heart sounds.     Comments: Tachycardia at rate of 106 Pulmonary:     Effort: Pulmonary effort is normal. No tachypnea, accessory muscle usage or respiratory distress.     Breath sounds: Normal breath sounds. No stridor.  Musculoskeletal:     Cervical back: Normal range of motion and neck supple. No rigidity.     Right lower leg: 1+ Edema present.     Left lower leg: 1+ Edema present.     Comments: Patient has PICC line to right upper extremity, no surrounding erythema or edema.  Patient has tenderness approximately 2 cm above PICC line  Skin:    General: Skin is warm and dry.     Coloration: Skin is not cyanotic or pale.  Neurological:     General: No focal deficit present.     Mental Status: She is alert and oriented to person, place, and time.     GCS: GCS eye subscore is 4. GCS verbal subscore is 5. GCS motor subscore is 6.     Cranial Nerves: No cranial nerve deficit or facial asymmetry.     Sensory: Sensation is intact.     Motor: No weakness, tremor, seizure activity or pronator drift.     Coordination: Romberg sign negative. Finger-Nose-Finger Test normal.     Gait: Gait is intact. Gait normal.     Comments: CN II-XII intact, equal grip strength, +5 strength to bilateral upper and lower extremities   Psychiatric:        Behavior: Behavior is cooperative.     ED Results / Procedures / Treatments   Labs (all labs ordered are listed, but only abnormal results are displayed) Labs Reviewed  COMPREHENSIVE METABOLIC PANEL - Abnormal; Notable for the following components:      Result Value   Sodium 132 (*)    Chloride 95 (*)    Glucose, Bld 546 (*)    Creatinine, Ser 1.24 (*)    Calcium 7.9 (*)  Total Protein 6.2 (*)    Albumin 2.1 (*)    AST 14 (*)    All other components within normal limits  CBC WITH DIFFERENTIAL/PLATELET - Abnormal; Notable for the following components:   RBC 3.43 (*)    Hemoglobin 10.3 (*)    HCT 30.4 (*)    Platelets 423 (*)     Eosinophils Absolute 0.6 (*)    All other components within normal limits  POC URINE PREG, ED    EKG EKG Interpretation  Date/Time:  Friday Apr 07 2021 21:20:01 EDT Ventricular Rate:  109 PR Interval:  135 QRS Duration: 80 QT Interval:  333 QTC Calculation: 449 R Axis:   92 Text Interpretation: Sinus tachycardia Borderline right axis deviation Borderline T wave abnormalities Baseline wander in lead(s) II aVR Confirmed by Noemi Chapel [phone]) on 04/07/2021 10:48:02 PM   Radiology CT Angio Chest PE W and/or Wo Contrast  Result Date: 04/07/2021 CLINICAL DATA:  Chest pain and shortness of breath EXAM: CT ANGIOGRAPHY CHEST WITH CONTRAST TECHNIQUE: Multidetector CT imaging of the chest was performed using the standard protocol during bolus administration of intravenous contrast. Multiplanar CT image reconstructions and MIPs were obtained to evaluate the vascular anatomy. CONTRAST:  186m OMNIPAQUE IOHEXOL 350 MG/ML SOLN COMPARISON:  Chest x-ray from the previous day. FINDINGS: Cardiovascular: Heart is mildly enlarged but stable. Thoracic aorta shows no aneurysmal dilatation or dissection. Right-sided PICC line is noted in satisfactory position. Pulmonary artery is well visualized with a normal branching pattern bilaterally. No focal filling defect is identified to suggest pulmonary embolism. No coronary calcifications are seen. Mediastinum/Nodes: Thoracic inlet is within normal limits. No sizable hilar or mediastinal adenopathy is noted. The esophagus as visualized is within normal limits. Lungs/Pleura: Mild dependent atelectatic changes are noted. No focal confluent infiltrate is seen. No parenchymal nodules are noted. Upper Abdomen: Visualized upper abdomen shows considerable ingested food stuffs within the stomach. Musculoskeletal: Bony structures are within normal limits. Review of the MIP images confirms the above findings. IMPRESSION: No evidence of pulmonary emboli. Mild dependent atelectatic  changes. No other focal abnormality is seen. Electronically Signed   By: MInez CatalinaM.D.   On: 04/07/2021 23:09   DG Chest Port 1 View  Result Date: 04/06/2021 CLINICAL DATA:  Chest pain EXAM: PORTABLE CHEST 1 VIEW COMPARISON:  03/31/2021 FINDINGS: Right upper extremity central venous catheter tip over the SVC. No focal opacity or pleural effusion. Normal cardiomediastinal silhouette. No pneumothorax. IMPRESSION: No active disease. Right upper extremity central venous catheter tip overlies the distal SVC Electronically Signed   By: KDonavan FoilM.D.   On: 04/06/2021 18:22   UE VENOUS DUPLEX ([facility] 7 am - 7 pm)  Result Date: 04/07/2021 UPPER VENOUS STUDY  Patient Name:  BCARLEI HUANG Date of Exam:   04/07/2021 Medical Rec #: [id number]        Accession #:    [id number] of Birth: 505/01/1996        Patient Gender: F Patient Age:   78YEx[facility]      VAS [place]PPER EXTREMITY VENOUS DUPLEX Referring Phys: [id number] Sexton--------------------------------------------------------------------------------  Indications: Pain, Swelling, and PICC line Performing Technologist: CAntonieta PertRDMS, RVT  Examination Guidelines: A complete evaluation includes B-mode imaging, spectral Doppler, color Doppler, and power Doppler as needed of all accessible portions of each vessel. Bilateral testing is considered an integral part of a complete examination. Limited examinations for reoccurring indications  may be performed as noted.  Right Findings: +----------+------------+---------+-----------+----------------+-------+ RIGHT     CompressiblePhasicitySpontaneous   Properties   Summary +----------+------------+---------+-----------+----------------+-------+ IJV           Full       Yes       Yes                            +----------+------------+---------+-----------+----------------+-------+ Subclavian               Yes       Yes                             +----------+------------+---------+-----------+----------------+-------+ Axillary      Full       Yes       Yes                            +----------+------------+---------+-----------+----------------+-------+ Brachial      Full       Yes       Yes                            +----------+------------+---------+-----------+----------------+-------+ Radial        Full                                                +----------+------------+---------+-----------+----------------+-------+ Ulnar         Full                                                +----------+------------+---------+-----------+----------------+-------+ Cephalic                                  softly echogenicChronic +----------+------------+---------+-----------+----------------+-------+ Basilic       Full       Yes       Yes                            +----------+------------+---------+-----------+----------------+-------+ small segment of chronic SVT in right cephalic at [facility] fossa the remainder of the cephalic is negative for thrombus.  Left Findings: +----------+------------+---------+-----------+----------+-------+ LEFT      CompressiblePhasicitySpontaneousPropertiesSummary +----------+------------+---------+-----------+----------+-------+ Subclavian    Full       Yes       Yes                      +----------+------------+---------+-----------+----------+-------+  Summary:  Right: No evidence of deep vein thrombosis in the upper extremity. 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  Left: No evidence of thrombosis in the subclavian.  *See table(s) above for measurements and observations.  Diagnosing physician: Ruta Hinds MD Electronically signed by Ruta Hinds MD on 04/07/2021 at 5:31:56 PM.    Final     Procedures Procedures   Medications Ordered in ED Medications  insulin glargine (LANTUS) injection 40 Units (has no  administration in time range)  metoCLOPramide (REGLAN) injection 10 mg (10 mg  Intravenous Given 04/07/21 2048)  diphenhydrAMINE (BENADRYL) injection 50 mg (50 mg Intravenous Given 04/07/21 2048)  iohexol (OMNIPAQUE) 350 MG/ML injection 100 mL (100 mLs Intravenous Contrast Given 04/07/21 2249)  sodium chloride 0.9 % bolus 1,000 mL (1,000 mLs Intravenous New Bag/Given 04/07/21 2142)    ED Course  I have reviewed the triage vital signs and the nursing notes.  Pertinent labs & imaging results that were available during my care of the patient were reviewed by me and considered in my medical decision making (see chart for details).    MDM Rules/Calculators/A&P                          Alert 25 year old female no acute distress, nontoxic-appearing.  Patient presents with chief complaint of headache, right upper arm pain, and chest pain.  Patient was seen yesterday at [facility] emergency department for the same complaints.  Patient left prior to having all lab work obtained.  Per chart review chest x-ray showed no active cardiopulmonary disease.  Troponin 10.    Patient was seen by her infectious disease provider Dr. Tommy Medal.  He sent patient to emergency department for concerns of right upper extremity DVT and pulmonary embolism.  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  Patient left [facility] Emergency Department prior to be seen by a provider.  Low suspicion for subarachnoid hemorrhage causing patient's headache as pain was not maximal in onset, progressively worsened over time, pain is not worse with exertion, patient has no focal neurological deficits on physical exam.  Suspect headache is due to migraine or tension headache.  Will give patient migraine cocktail and reassess.  On physical exam patient has no signs or symptoms consistent with  cellulitis or infection near PICC line site.  Patient has tenderness to right upper arm proximal to PICC line site.  Ultrasound of right upper extremity performed today showed no signs of DVT.  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  Patient's chest pain has been constant since yesterday.  Patient had troponin yesterday of 10.  Chest x-ray yesterday showed no active cardiopulmonary disease.  Will obtain EKG and CTA chest.  EKG shows sinus tachycardia.  2138 informed that patient's glucose was 546.  Patient has history of uncontrolled type 1 diabetes.  Patient has no signs of DKA as anion gap and bicarb within normal limits.  We will give patient 1 L fluid bolus as well as her home insulin. .  Per chart review note from infectious disease provider Dr. Tommy Medal advised the patient to be started on Augmentin or Keflex.  Reviewing patient's allergies patient is allergic to both of these medications.  Sent secure message to Dr. Lucendia Herrlich correspondence saying patient is okay to start Keflex as she was previously on Ancef.  He advised that patient's PICC line can be removed.  He will see the patient in clinic for follow-up.  PICC line was removed.  Catheter length is 35 was concerned with recorded catheter length.  Catheter tip was sent for culture.  CTA chest shows no signs of pulmonary embolism.  Suspect that patient's tachycardia secondary to hyperglycemia.  Importance of insulin use was stressed to the patient.  Patient advised to follow-up with Dr. Tommy Medal.  Discussed results, findings, treatment and follow up. Patient  advised of return precautions. Patient verbalized understanding and agreed with plan.   Final Clinical Impression(s) / ED Diagnoses Final diagnoses:  Precordial chest pain  Hyperglycemia  Acute nonintractable headache, unspecified headache type    Rx / DC Orders ED Discharge Orders         Ordered     sulfamethoxazole-trimethoprim (BACTRIM DS) 800-160 MG tablet  2 times daily,   Status:  Discontinued        04/07/21 2309    cephALEXin (KEFLEX) 500 MG capsule  4 times daily,   Status:  Discontinued        04/07/21 2321    cephALEXin (KEFLEX) 500 MG capsule  4 times daily,   Status:  Discontinued        04/07/21 2321    cephALEXin (KEFLEX) 500 MG capsule  4 times daily        04/07/21 2324           Loni Beckwith, PA-C 04/07/21 2312    Loni Beckwith, PA-C 04/07/21 2325    Loni Beckwith, PA-C 04/08/21 1358    Noemi Chapel, MD 04/08/21 1430

## 2021-04-07 NOTE — Progress Notes (Signed)
e

## 2021-04-07 NOTE — ED Notes (Signed)
Pt left AMA without being seen by a provider

## 2021-04-07 NOTE — Progress Notes (Signed)
RN assessed patient's PICC with MD. Patient reported pain and tenderness roughly 1-2 inches above the PICC insertion site. No redness or swelling noted. RN transported patient to ED with CMA. Instructed patient that in order for the ED team to appropriately evaluate her, they'll likely need labs and to ultrasound her arm. Patient left AMA yesterday before labs could be drawn. Patient verbalized understanding. Will continue to follow and update home health team ([facility]) as needed.   [name] Lorita Officer, RN

## 2021-04-07 NOTE — Progress Notes (Signed)
Right upper extremity venous duplex complete.  Please see CV Proc tab for preliminary results. Lita Mains- RDMS, RVT 11:27 AM  04/07/2021

## 2021-04-07 NOTE — Telephone Encounter (Signed)
Of course If ED did not do it

## 2021-04-07 NOTE — ED Provider Notes (Signed)
Emergency Medicine Provider Triage Evaluation Note  Diamond [name] , a 25 y.o. female  was evaluated in triage.  Pt complains of right arm pain and chest pain.  Patient is on Ancef 3 times daily for bacteremia and discitis.  She has a PICC line in her right upper extremity.  She was seen in the emergency department yesterday evening for an occluded PICC line which was corrected in the emergency department.  She followed up with infectious disease 1 to have her PICC line removed and due to her pain in the arm and chest she was sent to the emergency department for DVT/PE rule out.  She states her chest pain is sharp and intermittent.  No modifying factors.  Shortness of breath.  She reports intermittent nausea and vomiting since starting her antibiotics.  Last episode of vomiting was this morning.  She took a dose of antiemetics and denies any current nausea.  No other complaints.  Physical Exam  BP (!) 127/106 (BP Location: Left Arm)   Pulse (!) 56   Temp 98.5 F (36.9 C) (Oral)   Resp 18   SpO2 98%  Gen:   Awake, no distress   Resp:  Normal effort  MSK:   Moves extremities without difficulty  Other:    Medical Decision Making  Medically screening exam initiated at 10:04 AM.  Appropriate orders placed.  YEILIN ZWEBER was informed that the remainder of the evaluation will be completed by another provider, this initial triage assessment does not replace that evaluation, and the importance of remaining in the ED until their evaluation is complete.   Diamond Sexton, PA-C 04/07/21 1006    Davonna Belling, MD 04/07/21 [phone]

## 2021-04-07 NOTE — ED Notes (Signed)
Pt returned from CT Scan 

## 2021-04-07 NOTE — Telephone Encounter (Signed)
Patient called from the ED requesting to come back to clinic to get PICC pulled. Stated that there are people in the waiting room that have been waiting for a bed since 7 am and she has things that she needs to do. RN stated that she needed to stay and wait for them to see her. Labs that were drawn from yesterday 5/5 show a blood glucose of 453.   [place] results don't indicate a new clot in her right arm. Patient requested to be transferred to [facility] but RN stated that wasn't something we could arrange. RN instructed her to speak with the nurses/staff in the ED to have needs addressed as we are limited in what we can do from here.   Retta Pitcher Lorita Officer, RN

## 2021-04-07 NOTE — Progress Notes (Signed)
Inpatient Diabetes Program Recommendations  AACE/ADA: New Consensus Statement on Inpatient Glycemic Control (2015)  Target Ranges:  Prepandial:   less than 140 mg/dL      Peak postprandial:   less than 180 mg/dL (1-2 hours)      Critically ill patients:  140 - 180 mg/dL   Lab Results  Component Value Date   GLUCAP 198 (H) 04/02/2021   HGBA1C 8.7 (H) 04/01/2021    Review of Glycemic Control Results for Diamond [name], Diamond [name] (MRN [id number]) as of 04/07/2021 11:03  Ref. Range 04/06/2021 17:37  Glucose Latest Ref Range: 70 - 99 mg/dL 453 (H)   Diabetes history: DM type 1, Sees Dr. Dorris Fetch (Endocrinologist) 5/4 Outpatient Diabetes medications: Lantus 30 units, Apidra 10-16 units tid Current orders for Inpatient glycemic control:  None being evaluated in ED  A1c 8.7%  Inpatient Diabetes Program Recommendations:    -  Start Novolog 0-15 units Q4 hours while in ED  - Consider starting Lantus 30 units.  Thanks,  Tama Headings RN, MSN, BC-ADM Inpatient Diabetes Coordinator Team Pager [phone] (8a-5p)

## 2021-04-08 LAB — CBG MONITORING, ED: Glucose-Capillary: 527 mg/dL (ref 70–99)

## 2021-04-08 MED ORDER — INSULIN ASPART 100 UNIT/ML IJ SOLN: 10.0000 [IU] | Freq: Once | INTRAMUSCULAR | Status: DC

## 2021-04-10 ENCOUNTER — Ambulatory Visit: Payer: Self-pay | Admitting: Infectious Disease

## 2021-04-10 NOTE — Telephone Encounter (Signed)
Yes Jinny Blossom that would be a good idea

## 2021-04-10 NOTE — Telephone Encounter (Signed)
Per chart, PICC was removed in emergency department and patient was started on oral antibiotics. Dr. Tommy Medal made aware.   Beryle Flock, RN

## 2021-04-11 ENCOUNTER — Other Ambulatory Visit: Payer: Self-pay

## 2021-04-11 ENCOUNTER — Encounter: Payer: Self-pay | Admitting: Gastroenterology

## 2021-04-11 ENCOUNTER — Telehealth: Payer: Self-pay

## 2021-04-11 ENCOUNTER — Ambulatory Visit (INDEPENDENT_AMBULATORY_CARE_PROVIDER_SITE_OTHER): Payer: Self-pay | Admitting: Gastroenterology

## 2021-04-11 VITALS — BP 148/105 | HR 103 | Temp 97.1°F | Ht 59.0 in | Wt 119.8 lb

## 2021-04-11 DIAGNOSIS — K219 Gastro-esophageal reflux disease without esophagitis: Secondary | ICD-10-CM

## 2021-04-11 DIAGNOSIS — R197 Diarrhea, unspecified: Secondary | ICD-10-CM | POA: Insufficient documentation

## 2021-04-11 DIAGNOSIS — R1010 Upper abdominal pain, unspecified: Secondary | ICD-10-CM | POA: Insufficient documentation

## 2021-04-11 DIAGNOSIS — R112 Nausea with vomiting, unspecified: Secondary | ICD-10-CM

## 2021-04-11 DIAGNOSIS — K200 Eosinophilic esophagitis: Secondary | ICD-10-CM

## 2021-04-11 DIAGNOSIS — E1143 Type 2 diabetes mellitus with diabetic autonomic (poly)neuropathy: Secondary | ICD-10-CM

## 2021-04-11 DIAGNOSIS — K3184 Gastroparesis: Secondary | ICD-10-CM

## 2021-04-11 LAB — CATH TIP CULTURE: Culture: NO GROWTH

## 2021-04-11 NOTE — Progress Notes (Signed)
Primary Care Physician: [name], PA-C  Primary Gastroenterologist:  Elon Alas. Abbey Chatters, DO   Chief Complaint  Patient presents with  . HFU  . Diarrhea    Several times per day, very watery  . Vomiting    Several times per day, starts usually around 3am  . Abdominal Pain    Upper abd pain, sharp, spasms    HPI: Diamond [name] is a 25 y.o. female here for follow-up.  She has a complicated past medical history.  She has been a type I diabetic for over 15 years, numerous comorbidities including gastroparesis on Reglan, peripheral neuropathy, previous stroke, pancreatitis (lipase in the 300-400 range with no CT evidence of pancreatitis in September 2021).  She also reports history of Crohn's.  History of chronic GERD.    Patient with multiple hospital admissions and ED visits.  We saw the patient when she was in the hospital back in February/March.  She was admitted with hyperosmolar state, on insulin drip, with initial blood glucose greater than 700.  GI was consulted to consider completing planned EGD and colonoscopy as inpatient.  Colonoscopy and EGD have been planned as an outpatient it was felt that she would benefit from inpatient evaluation. See findings below.  Since we last saw her she has been hospitalized additional times.  Hospitalized with uncontrolled diabetes, nephrotic syndrome, staph aureus multifocal pneumonia with sepsis and ?discitis followed by ID and recently transitions from IV Ancef to oral Keflex, TEE negative for vegetation, acute kidney injury, possible infiltrative cardiac disease. Recent pyelonephritis. Used to have PICC but this was removed last Friday when she presented with pain above site and chest pain.   From a GI standpoint: recent EGD with findings s/o eosinophilic esophagitis.  She has been on omeprazole 40 mg twice daily.  She continues to have dysphagia to certain foods, rice/noodles/pasta.  Denies heartburn.  She is having epigastric pain  frequently.  Feels crampy.  Denies postprandial component.  Frequent vomiting.  Often after meals will throw up food or bile.  Sometimes throws up at 3 AM in the morning.  Can vomit before meals.  Currently taking Zofran and Reglan before breakfast, lunch, dinner.  Denies hematemesis.  Has had diarrhea off and on.  Persisting for the last week.  Several watery stools daily.  No melena or rectal bleeding.  Sometimes has constipation. She is lost 20 pounds since February but she believes her weight was all due to edema.  She feels like she is at her baseline weight now.   Prior work-up:   CT abdomen pelvis with contrast February 2022: Distended urinary bladder with questionable posterior bladder wall thickening, progressive generalized subcutaneous edema of body wall fat, third-spacing, mild distal esophageal wall thickening with small amount of contrast in distal esophagus, increased size of bilateral inguinal lymph nodes. Moderate stool in colon.   Prior CT without contrast Dec 2021 with mild thickening of TI, non-specific given edema/anasarca.   Completed kidney biopsy in April.  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  Mild focal interstitial fibrosis and tubular atrophy.  Following with Dr.Bhutani.   CTA chest Apr 07, 2021 IMPRESSION: 1.No evidence of pulmonary emboli. 2.Mild dependent atelectatic changes. 3.No other focal abnormality is seen.  CTA abdomen and pelvis with contrast March 31, 2021 IMPRESSION: 1. Abnormal low density in the lower pole the right kidney probably indicating focal pyelonephritis. No evidence of renal abscess. 2. Small amount of ascites. No focal bowel finding. 3. Small  subcapsular collection of the left kidney subsequent to previous renal biopsy, similar to the previous ultrasound. 4. Distended bladder.  Abdominal ultrasound March 18, 2021: IMPRESSION: 1. No evidence for acute cholecystitis. Small gallbladder polyp measures 5  millimeters. 2. Subcapsular collection along the anterolateral aspect of the LEFT kidney following ultrasound-guided core biopsy on 03/07/2021. Favor subcapsular hematoma/seroma. There is no obvious abscess. However, consider further evaluation with CT of the abdomen and pelvis for further characterization. Intravenous contrast is recommended unless it is contraindicated.  Possible Crohn's disease with colonoscopy in 2011 by Dr. Burr Medico at [facility] with colonic mucosa erythematous and thick at level of cecum and presence of ulcerations.  TI was not intubated.  EGD was also completed with antral gastritis. Pathology of cecum, ascending colon, and transverse colon showed focal active colitis. Duodenal biopsies with mildly increased lymphocytes. Antral biopsies without H.pylori. Appears she was prescribed Pentasa after review of notes from 2011. She was also on amitriptyline. At that time, felt to possibly have early IBD, indeterminate colitis.   Colonoscopy February 03, 2021: - Preparation of the colon was fair after copious. - Non-bleeding internal hemorrhoids. - The examined portion of the ileum was normal. Biopsied.  Chronic ileitis, no activity, granulomata or malignancy - Stool in the entire examined colon.   EGD February 01, 2021: - Normal hypopharynx. - Longitudinally marked mucosa in the esophagus. Biopsied.  Chronic esophagitis with increased eosinophils findings can be compatible with eosinophilic esophagitis - Z-line irregular, 38 cm from the incisors. - Normal stomach. - Normal duodenal bulb and second portion of the duodenum. Biopsied.  Unremarkable biopsies     Current Outpatient Medications  Medication Sig Dispense Refill  . acetaminophen (TYLENOL) 500 MG tablet Take by mouth.    Marland Kitchen albuterol (VENTOLIN HFA) 108 (90 Base) MCG/ACT inhaler Inhale 2 puffs into the lungs every 6 (six) hours as needed for wheezing or shortness of breath. For shortness of breath 3 each 0  .  cephALEXin (KEFLEX) 500 MG capsule Take 1 capsule (500 mg total) by mouth 4 (four) times daily. 120 capsule 0  . furosemide (LASIX) 20 MG tablet Take 20 mg by mouth daily.    Marland Kitchen gabapentin (NEURONTIN) 100 MG capsule Take 1 capsule (100 mg total) by mouth 2 (two) times daily. 180 capsule 5  . hydrALAZINE (APRESOLINE) 25 MG tablet Take 1 tablet (25 mg total) by mouth 2 (two) times daily. 90 tablet 3  . hydrochlorothiazide (HYDRODIURIL) 25 MG tablet Take 25 mg by mouth daily.    . insulin glargine (LANTUS) 100 UNIT/ML injection Inject 40 Units into the skin at bedtime.    . Insulin Glulisine (APIDRA [place]) Inject 10-16 Units into the skin 3 (three) times daily before meals. ssi    . isosorbide mononitrate (IMDUR) 30 MG 24 hr tablet Take 0.5 tablets (15 mg total) by mouth daily. (Patient taking differently: Take 30 mg by mouth daily.)    . loperamide (IMODIUM A-D) 2 MG tablet Take 1 tablet (2 mg total) by mouth 4 (four) times daily as needed for diarrhea or loose stools. 30 tablet 0  . metoCLOPramide (REGLAN) 5 MG tablet Take 1 tablet (5 mg total) by mouth every 8 (eight) hours as needed for refractory nausea / vomiting (refractory symptoms). 90 tablet 0  . metoprolol tartrate (LOPRESSOR) 50 MG tablet Take 1 tablet (50 mg total) by mouth 2 (two) times daily. 180 tablet 0  . omeprazole (PRILOSEC) 40 MG capsule Take 1 capsule (40 mg total) by mouth  in the morning and at bedtime. 18 capsule 0  . ondansetron (ZOFRAN ODT) 8 MG disintegrating tablet Take 1 tablet (8 mg total) by mouth every 8 (eight) hours as needed for nausea or vomiting. 20 tablet 2  . oxyCODONE (OXY IR/ROXICODONE) 5 MG immediate release tablet Take 1 tablet (5 mg total) by mouth every 4 (four) hours as needed for moderate pain, severe pain or breakthrough pain. 10 tablet 0  . potassium chloride (KLOR-CON) 10 MEQ tablet Take 10 mEq by mouth daily.     No current facility-administered medications for this visit.    Allergies as of 04/11/2021 -  Review Complete 04/11/2021  Allergen Reaction Noted  . Amoxicillin Hives 04/28/2012  . Dexamethasone Hives and Itching 05/04/2020  . Doxycycline Itching and Swelling 04/17/2020  . Penicillins Hives and Itching 04/28/2012  . Ceftriaxone sodium in dextrose Itching 02/29/2020  . Augmentin [amoxicillin-pot clavulanate] Other (See Comments) 07/12/2012  . Clavulanic acid  05/29/2019  . Adhesive [tape] Itching and Rash 07/12/2012  . Latex Itching and Rash 05/15/2012  . Other Itching and Rash 07/12/2012  . Pineapple Rash 06/26/2012    ROS:  General: Negative for anorexia, weight loss, fever, chills, fatigue, weakness. See hpi ENT: Negative for hoarseness, difficulty swallowing , nasal congestion. CV: Negative for chest pain, angina, palpitations, dyspnea on exertion,+ improved peripheral edema.  Respiratory: Negative for dyspnea at rest, dyspnea on exertion, cough, sputum, wheezing.  GI: See history of present illness. GU:  Negative for dysuria, hematuria, urinary incontinence, urinary frequency, nocturnal urination.  Endo: Negative for unusual weight change. See hpi   Physical Examination:   BP (!) 148/105   Pulse (!) 103   Temp (!) 97.1 F (36.2 C)   Ht 4' 11"  (1.499 m)   Wt 119 lb 12.8 oz (54.3 kg)   BMI 24.20 kg/m   General: Well-nourished, well-developed in no acute distress. Accompanied by [employer] paramedic, [facility] who assists her at office visits. Eyes: No icterus. Mouth: masked Lungs: Clear to auscultation bilaterally.  Heart: Regular rate and rhythm, no murmurs rubs or gallops.  Abdomen: Bowel sounds are normal,  nondistended, no hepatosplenomegaly or masses, no abdominal bruits or hernia , no rebound or guarding.  Mild epig tenderness Extremities: trace lower extremity edema. No clubbing or deformities. Neuro: Alert and oriented x 4   Skin: Warm and dry, no jaundice.   Psych: Alert and cooperative, normal mood and affect.  Labs:  Lab Results  Component Value  Date   CREATININE 1.24 (H) 04/07/2021   BUN 8 04/07/2021   NA 132 (L) 04/07/2021   K 3.7 04/07/2021   CL 95 (L) 04/07/2021   CO2 27 04/07/2021   Lab Results  Component Value Date   ALT 9 04/07/2021   AST 14 (L) 04/07/2021   ALKPHOS 94 04/07/2021   BILITOT 0.6 04/07/2021   Lab Results  Component Value Date   WBC 6.2 04/07/2021   HGB 10.3 (L) 04/07/2021   HCT 30.4 (L) 04/07/2021   MCV 88.6 04/07/2021   PLT 423 (H) 04/07/2021   Lab Results  Component Value Date   LIPASE 20 03/31/2021   Lab Results  Component Value Date   TSH 2.228 01/30/2021   Lab Results  Component Value Date   IRON 80 04/05/2021   TIBC 241 (L) 04/05/2021   FERRITIN 193 04/05/2021   Lab Results  Component Value Date   FOLATE 17.5 04/05/2021   Lab Results  Component Value Date   VITAMINB12 473 04/05/2021  Lab Results  Component Value Date   ESRSEDRATE 140 (H) 03/09/2021   Lab Results  Component Value Date   CRP 4.8 (H) 03/09/2021   Lab Results  Component Value Date   HGBA1C 8.7 (H) 04/01/2021    Imaging Studies: DG Chest 1 View  Result Date: 03/16/2021 CLINICAL DATA:  Right-sided PICC placement, abdominal pain EXAM: CHEST  1 VIEW COMPARISON:  03/04/2021 FINDINGS: Single frontal view of the chest demonstrates right-sided PICC tip overlying superior vena cava. The cardiac silhouette is unremarkable. Improved aeration at the lung bases, with minimal residual bibasilar consolidation. No effusion or pneumothorax. IMPRESSION: 1. Unremarkable right-sided PICC. 2. Improving bibasilar consolidation which may reflect resolving pneumonia or atelectasis. Electronically Signed   By: Diamond [name] M.D.   On: 03/16/2021 15:30   CT Angio Chest PE W and/or Wo Contrast  Result Date: 04/07/2021 CLINICAL DATA:  Chest pain and shortness of breath EXAM: CT ANGIOGRAPHY CHEST WITH CONTRAST TECHNIQUE: Multidetector CT imaging of the chest was performed using the standard protocol during bolus administration  of intravenous contrast. Multiplanar CT image reconstructions and MIPs were obtained to evaluate the vascular anatomy. CONTRAST:  143m OMNIPAQUE IOHEXOL 350 MG/ML SOLN COMPARISON:  Chest x-ray from the previous day. FINDINGS: Cardiovascular: Heart is mildly enlarged but stable. Thoracic aorta shows no aneurysmal dilatation or dissection. Right-sided PICC line is noted in satisfactory position. Pulmonary artery is well visualized with a normal branching pattern bilaterally. No focal filling defect is identified to suggest pulmonary embolism. No coronary calcifications are seen. Mediastinum/Nodes: Thoracic inlet is within normal limits. No sizable hilar or mediastinal adenopathy is noted. The esophagus as visualized is within normal limits. Lungs/Pleura: Mild dependent atelectatic changes are noted. No focal confluent infiltrate is seen. No parenchymal nodules are noted. Upper Abdomen: Visualized upper abdomen shows considerable ingested food stuffs within the stomach. Musculoskeletal: Bony structures are within normal limits. Review of the MIP images confirms the above findings. IMPRESSION: No evidence of pulmonary emboli. Mild dependent atelectatic changes. No other focal abnormality is seen. Electronically Signed   By: MInez CatalinaM.D.   On: 04/07/2021 23:09   [place]Abdomen Complete  Result Date: 03/18/2021 CLINICAL DATA:  Nausea and vomiting for 1 week. Patient had ultrasound-guided core biopsy of the LEFT kidney performed on 03/07/2021 for history of nephrotic syndrome. EXAM: ABDOMEN ULTRASOUND COMPLETE COMPARISON:  CT of the abdomen and pelvis on 01/17/2021, abdominal ultrasound 10/18/2020 BB FINDINGS: Gallbladder: Gallbladder wall is 1.9 millimeters in thickness. No stones, pericholecystic fluid, or Murphy's sign. Note is made of a 5 millimeter polyp along the posterior wall of the gallbladder, not seen on prior study. Common bile duct: Diameter: 4.4 millimeters Liver: No focal lesion identified. Within  normal limits in parenchymal echogenicity. Portal vein is patent on color Doppler imaging with normal direction of blood flow towards the liver. IVC: No abnormality visualized. Pancreas: Visualized portion unremarkable. Spleen: Normal appearance of the spleen. Accessory spleen along the inferior tip measures 1.8 centimeters. Right Kidney: Length: 10.8 centimeters. Echogenicity within normal limits. No mass or hydronephrosis visualized. Left Kidney: Length: 11.3 centimeters. Mildly heterogeneous subcapsular collection along the anterolateral aspect of the LEFT kidney measures 5 x 0.6 centimeters and likely represents hematoma/seroma from recent ultrasound-guided core biopsy. The appearance would be atypical for abscess. There is no evidence for inflammatory changes in the surrounding fat. No suspicious mass or hydronephrosis. Abdominal aorta: No aneurysm visualized. Other findings: Small bilateral pleural effusions. Question trace ascites. IMPRESSION: 1. No evidence for acute cholecystitis. Small gallbladder  polyp measures 5 millimeters. 2. Subcapsular collection along the anterolateral aspect of the LEFT kidney following ultrasound-guided core biopsy on 03/07/2021. Favor subcapsular hematoma/seroma. There is no obvious abscess. However, consider further evaluation with CT of the abdomen and pelvis for further characterization. Intravenous contrast is recommended unless it is contraindicated. Electronically Signed   By: Nolon Nations M.D.   On: 03/18/2021 11:31   CT ABDOMEN PELVIS W CONTRAST  Result Date: 03/31/2021 CLINICAL DATA:  Abdominal abscess or infection suspected. Abdominal pain, nausea and vomiting. EXAM: CT ABDOMEN AND PELVIS WITH CONTRAST TECHNIQUE: Multidetector CT imaging of the abdomen and pelvis was performed using the standard protocol following bolus administration of intravenous contrast. CONTRAST:  17m OMNIPAQUE IOHEXOL 300 MG/ML  SOLN COMPARISON:  Ultrasound 2 weeks ago. FINDINGS: Lower  chest: Minimal basilar atelectasis. Hepatobiliary: No liver parenchymal finding. No calcified gallstones. Pancreas: Pancreas appears normal. Spleen: Normal Adrenals/Urinary Tract: Adrenal glands are normal. Right kidney shows an area of low-density in the lower pole consistent with focal pyelonephritis. No obstruction. Small subcapsular collection of the left kidney of maximal thickness measuring 8 mm, similar to the previous ultrasound. No renal obstruction. Bladder is full. Stomach/Bowel: No evidence of bowel obstruction. The appendix appears normal. There is a small amount of ascites. No focal finding. Vascular/Lymphatic: Normal Reproductive: Normal Other: No free air.  No hernia. Musculoskeletal: Normal IMPRESSION: 1. Abnormal low density in the lower pole the right kidney probably indicating focal pyelonephritis. No evidence of renal abscess. 2. Small amount of ascites. No focal bowel finding. 3. Small subcapsular collection of the left kidney subsequent to previous renal biopsy, similar to the previous ultrasound. 4. Distended bladder. Electronically Signed   By: MNelson ChimesM.D.   On: 03/31/2021 18:03   DG Chest Port 1 View  Result Date: 04/06/2021 CLINICAL DATA:  Chest pain EXAM: PORTABLE CHEST 1 VIEW COMPARISON:  03/31/2021 FINDINGS: Right upper extremity central venous catheter tip over the SVC. No focal opacity or pleural effusion. Normal cardiomediastinal silhouette. No pneumothorax. IMPRESSION: No active disease. Right upper extremity central venous catheter tip overlies the distal SVC Electronically Signed   By: KDonavan FoilM.D.   On: 04/06/2021 18:22   DG Chest Port 1 View  Result Date: 03/31/2021 CLINICAL DATA:  Chest pain and dizziness EXAM: PORTABLE CHEST 1 VIEW COMPARISON:  March 16, 2021 FINDINGS: There remains atelectatic change in the medial right base. Lungs elsewhere are clear. Heart size and pulmonary vascularity are normal. No adenopathy. Central catheter tip is in the superior  vena cava. No pneumothorax. No bone lesions. IMPRESSION: Central catheter tip in superior vena cava. No pneumothorax. Medial right base atelectasis. Lungs elsewhere clear. Cardiac silhouette within normal limits. Electronically Signed   By: WLowella GripIII M.D.   On: 03/31/2021 16:35   UE VENOUS DUPLEX ([facility]& [facility] 7 am - 7 pm)  Result Date: 04/07/2021 UPPER VENOUS STUDY  Patient Name:  BSHAWNICE TILMON Date of Exam:   04/07/2021 Medical Rec #: [id number]        Accession #:    [id number] of Birth: [birth year]        Patient Gender: F Patient Age:   42YExam [facility]  [facility]:      VAS [place]UPPER EXTREMITY VENOUS DUPLEX Referring Phys: [id number] [name]--------------------------------------------------------------------------------  Indications: Pain, Swelling, and PICC line Performing Technologist: CAntonieta PertRDMS, RVT  Examination Guidelines: A complete evaluation includes B-mode imaging, spectral Doppler, color Doppler, and power  Doppler as needed of all accessible portions of each vessel. Bilateral testing is considered an integral part of a complete examination. Limited examinations for reoccurring indications may be performed as noted.  Right Findings: +----------+------------+---------+-----------+----------------+-------+ RIGHT     CompressiblePhasicitySpontaneous   Properties   Summary +----------+------------+---------+-----------+----------------+-------+ IJV           Full       Yes       Yes                            +----------+------------+---------+-----------+----------------+-------+ Subclavian               Yes       Yes                            +----------+------------+---------+-----------+----------------+-------+ Axillary      Full       Yes       Yes                            +----------+------------+---------+-----------+----------------+-------+ Brachial      Full       Yes       Yes                             +----------+------------+---------+-----------+----------------+-------+ Radial        Full                                                +----------+------------+---------+-----------+----------------+-------+ Ulnar         Full                                                +----------+------------+---------+-----------+----------------+-------+ Cephalic                                  softly echogenicChronic +----------+------------+---------+-----------+----------------+-------+ Basilic       Full       Yes       Yes                            +----------+------------+---------+-----------+----------------+-------+ small segment of chronic SVT in right cephalic at [facility] fossa the remainder of the cephalic is negative for thrombus.  Left Findings: +----------+------------+---------+-----------+----------+-------+ LEFT      CompressiblePhasicitySpontaneousPropertiesSummary +----------+------------+---------+-----------+----------+-------+ Subclavian    Full       Yes       Yes                      +----------+------------+---------+-----------+----------+-------+  Summary:  Right: No evidence of deep vein thrombosis in the upper extremity. 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  Left: No evidence of thrombosis in the subclavian.  *See table(s) above for measurements and observations.  Diagnosing physician: Ruta Hinds MD Electronically signed by Ruta Hinds MD on 04/07/2021 at 5:31:56 PM.    Final      Assessment: 25 year old female with  complex medical history as outlined above presenting for further evaluation of ongoing nausea/vomiting, dysphagia, epigastric pain, diarrhea.  Questionable history of Crohn's.  Suspected gastroparesis, limited GES last year due to vomiting.  Is not clear to me whether she had prior abnormal GES. Recent EGD and colonoscopy with findings suggestive of eosinophilic esophagitis.   Colon and terminal ileum appeared normal but on biopsies chronic ileitis noted.  CT imaging as outlined previously.  Nausea/vomiting: Persisting symptoms.  Taking Zofran and Reglan before meals.  Last A1c 8.7.  Denies typical heartburn.  Currently on omeprazole 40 mg twice daily.  Suspect symptoms multifactorial in the setting of poorly controlled diabetes, multiple recent infections, likely gastroparesis, plus or minus related to eosinophilic esophagitis.  Eosinophilic esophagitis: Noted on recent EGD.  Currently on PPI twice daily.  Some dysphagia to certain foods, rice/pasta.  Often vomits after meals.  Resources for medications limited to [facility] or cheap options.  Given her history of hives/itching to dexamethasone, I do not feel comfortable at this time trying fluticasone or budesonide which would likely be unaffordable for her.  We will send her to allergist to test for food allergies.  She could pursue food elimination along with PPI therapy as first-line treatment.  Epigastric pain: Etiology unclear.  Continue PPI twice daily.  Food allergy testing.  Diarrhea: Previously she reported mostly constipation.  Given multiple hospitalizations and multiple antibiotics, we need to rule out C. difficile.  ?Crohn's: Previously diagnosed with possible early IBD versus indeterminate colitis based on colonoscopy when she was 38.  No significant findings via CT suggestive of Crohn's.  Recent colonoscopy with ileoscopy endoscopically appeared normal but on biopsy from the ileum, mild chronic ileitis noted.  We will continue to follow for now.  Plan: 1. Referral to allergist for eosinophilic esophagitis. 2. Continue omeprazole 40 mg twice daily. 3. Stool for C. difficile [facility].

## 2021-04-11 NOTE — Progress Notes (Signed)
Cc'ed to pcp °

## 2021-04-11 NOTE — Patient Instructions (Addendum)
1. Please submit stool asap to rule out Cdiff infection which can be caused by multiple antibiotics use. 2. Referral to allergist. 3. Continue omeprazole twice daily. 4.  I am holding off on fluticasone spray due to your listed allergy to dexamethasone and potential allergic reaction. Await allergist work up.

## 2021-04-11 NOTE — Telephone Encounter (Signed)
Received call from [facility], patient's community paramedic wanting to clarify patient's antibiotic regimen. She states she picked up Bactrim and Keflex from [facility]. RN spoke to Dr. Tommy Medal who confirms patient should not be taking Bactrim, only Keflex 500 mg PO 4 times a day for one month.   Macie states only 20 capsules of Keflex were given to her. RN called [facility], spoke to pharmacist Dianne who confirms they dispensed 20 capsules of Keflex. RN gave verbal orders per Dr. Tommy Medal to fill the remaining 100 capsules from original emergency department order.   RN called Macie back, advised her patient should not be taking Bactrim and that the additional Keflex has been called into the pharmacy to reflect the original order for duration of 1 month. Macie verbalized understanding. RN advised her to please call with any further questions.   Beryle Flock, RN

## 2021-04-11 NOTE — Progress Notes (Unsigned)
am

## 2021-04-13 ENCOUNTER — Telehealth: Payer: Self-pay

## 2021-04-13 NOTE — Telephone Encounter (Signed)
Called client for follow up regarding Diabetes and need for [facility] financial assistance application to be completed.  Congratulated client on last A1C of 8.7 encouraged her to continue to monitor her blood sugars and take her medications as prescribed. She reports she is keeping a log sheet and reports FBS of 238.  She is still complaining of nausea as well as loose stools and headaches.she reports she is eating and continues to attempt to stay hydrated. Her PICC line has been removed and she is now on oral antibiotics keflex from infectious disease.   Reviewed all upcoming appointments.  She reports she has referrals to allergist and cardiologist and allergist called but she could not pay so could not make that appointment. Discussed the importance of completing her [facility] financial application so she can continue to see specialist as needed. Discussed that [facility] can help submit that for her and also notarize her letter of support from her family at home visit if needed. Client reports understanding and this application completion was emphasized several times through our conversation.  Client reports she has glucose testing supplies. Reminded client that [facility] can assist with the [employer] strips so she continue to test.  Disability: client reports a disability appointment on 05/24/21 at 10 am she is unsure of location, but sounded excited.  Medicaid: no further notice of appeal process. Talked with Yehuda Savannah financial counselor with [employer] and client is cleared that she see to apply for CAFA.  Will continue to follow and coordinate care with [facility]. [facility] community paramedic has been working closely with client and we have weekly updates by phone.    Debria Garret RN [facility]

## 2021-04-17 ENCOUNTER — Ambulatory Visit: Payer: Self-pay | Admitting: Infectious Disease

## 2021-04-18 ENCOUNTER — Other Ambulatory Visit: Payer: Self-pay

## 2021-04-18 ENCOUNTER — Encounter: Payer: Self-pay | Admitting: Physician Assistant

## 2021-04-18 ENCOUNTER — Ambulatory Visit: Payer: Medicaid Other | Admitting: Physician Assistant

## 2021-04-18 VITALS — BP 148/110 | HR 102 | Temp 96.6°F

## 2021-04-18 DIAGNOSIS — I10 Essential (primary) hypertension: Secondary | ICD-10-CM

## 2021-04-18 DIAGNOSIS — R110 Nausea: Secondary | ICD-10-CM

## 2021-04-18 DIAGNOSIS — K50919 Crohn's disease, unspecified, with unspecified complications: Secondary | ICD-10-CM

## 2021-04-18 DIAGNOSIS — E1065 Type 1 diabetes mellitus with hyperglycemia: Secondary | ICD-10-CM

## 2021-04-18 MED ORDER — PROMETHAZINE HCL 25 MG PO TABS: 25.0000 mg | ORAL_TABLET | Freq: Three times a day (TID) | ORAL | 0 refills | Status: DC | PRN

## 2021-04-18 NOTE — Progress Notes (Signed)
BP (!) 148/110   Pulse (!) 102   Temp (!) 96.6 F (35.9 C)   SpO2 97%    Subjective:    Patient ID: Diamond [name], female    DOB: 03-30-1996, 25 y.o.   MRN: [id number]  HPI: Diamond [name] is a 25 y.o. female presenting on 04/18/2021 for No chief complaint on file.   HPI     Pt had a negative covid 19 screening questionnaire.   Pt has uncontrolled type 1 diabetes, Crohns disease, diabetic nephropathy, anasarca and nephrotic symdrome. She is in today for routine follow up  She is seeing:  Endocrinology for DM ID for bacteremia and discitis GI for eosinophilic esophagitis Urology for bladder ourlet obsturction Nephrology for CKD She has been referred to cardiology for possible infiltrative cardiac disease   Her medicaid was denied.  She has appealed.   She has been having some headaches for several monnths.  She takes APAP that sometimes helps.   Sometimes it helps for her to go into a dark quiet room and rest.  She Went to ER over the weekend with emesis.  She had an allergy appointment but it was cancelled due to no insurance.  She has still got gotten approved for [facility] [employer] financial assistance.  she hasn't turned in all the needed paperwork.  She was encouraged to get this application submitted at her initial office visit in October 2021.   She has appt this afternoon with gyn for PAP.  She has family planning medicaid  She says she is taking all of her medicines but she only brought her keflex  She needs more apidra and lantus.  She says her BP at home has been good.  Pt is no longer going to [facility].  She doesn't feel like she needs it.      Relevant past medical, surgical, family and social history reviewed and updated as indicated. Interim medical history since our last visit reviewed. Allergies and medications reviewed and updated.   Current Outpatient Medications:  .  cephALEXin (KEFLEX) 500 MG capsule, Take 1 capsule (500 mg total) by  mouth 4 (four) times daily., Disp: 120 capsule, Rfl: 0 .  acetaminophen (TYLENOL) 500 MG tablet, Take by mouth., Disp: , Rfl:  .  albuterol (VENTOLIN HFA) 108 (90 Base) MCG/ACT inhaler, Inhale 2 puffs into the lungs every 6 (six) hours as needed for wheezing or shortness of breath. For shortness of breath, Disp: 3 each, Rfl: 0 .  furosemide (LASIX) 20 MG tablet, Take 20 mg by mouth daily., Disp: , Rfl:  .  gabapentin (NEURONTIN) 100 MG capsule, Take 1 capsule (100 mg total) by mouth 2 (two) times daily., Disp: 180 capsule, Rfl: 5 .  hydrALAZINE (APRESOLINE) 25 MG tablet, Take 1 tablet (25 mg total) by mouth 2 (two) times daily., Disp: 90 tablet, Rfl: 3 .  hydrochlorothiazide (HYDRODIURIL) 25 MG tablet, Take 25 mg by mouth daily., Disp: , Rfl:  .  insulin glargine (LANTUS) 100 UNIT/ML injection, Inject 40 Units into the skin at bedtime., Disp: , Rfl:  .  Insulin Glulisine (APIDRA [place]), Inject 10-16 Units into the skin 3 (three) times daily before meals. ssi, Disp: , Rfl:  .  isosorbide mononitrate (IMDUR) 30 MG 24 hr tablet, Take 0.5 tablets (15 mg total) by mouth daily. (Patient taking differently: Take 30 mg by mouth daily.), Disp: , Rfl:  .  loperamide (IMODIUM A-D) 2 MG tablet, Take 1 tablet (2 mg total) by mouth 4 (four)  times daily as needed for diarrhea or loose stools., Disp: 30 tablet, Rfl: 0 .  metoCLOPramide (REGLAN) 5 MG tablet, Take 1 tablet (5 mg total) by mouth every 8 (eight) hours as needed for refractory nausea / vomiting (refractory symptoms)., Disp: 90 tablet, Rfl: 0 .  metoprolol tartrate (LOPRESSOR) 50 MG tablet, Take 1 tablet (50 mg total) by mouth 2 (two) times daily., Disp: 180 tablet, Rfl: 0 .  omeprazole (PRILOSEC) 40 MG capsule, Take 1 capsule (40 mg total) by mouth in the morning and at bedtime., Disp: 18 capsule, Rfl: 0 .  ondansetron (ZOFRAN ODT) 8 MG disintegrating tablet, Take 1 tablet (8 mg total) by mouth every 8 (eight) hours as needed for nausea or vomiting., Disp: 20  tablet, Rfl: 2 .  oxyCODONE (OXY IR/ROXICODONE) 5 MG immediate release tablet, Take 1 tablet (5 mg total) by mouth every 4 (four) hours as needed for moderate pain, severe pain or breakthrough pain., Disp: 10 tablet, Rfl: 0 .  potassium chloride (KLOR-CON) 10 MEQ tablet, Take 10 mEq by mouth daily., Disp: , Rfl:       Review of Systems  Per HPI unless specifically indicated above     Objective:    BP (!) 148/110   Pulse (!) 102   Temp (!) 96.6 F (35.9 C)   SpO2 97%   Wt Readings from Last 3 Encounters:  04/11/21 119 lb 12.8 oz (54.3 kg)  04/07/21 128 lb 14.4 oz (58.5 kg)  04/07/21 128 lb (58.1 kg)    Physical Exam Vitals reviewed.  Constitutional:      General: She is not in acute distress.    Appearance: She is well-developed. She is not toxic-appearing.  HENT:     Head: Normocephalic and atraumatic.  Cardiovascular:     Rate and Rhythm: Normal rate and regular rhythm.  Pulmonary:     Effort: Pulmonary effort is normal.     Breath sounds: Normal breath sounds.  Abdominal:     General: Bowel sounds are normal.     Palpations: Abdomen is soft. There is no mass.     Tenderness: There is no abdominal tenderness.  Musculoskeletal:     Cervical back: Neck supple.     Right lower leg: No edema.     Left lower leg: No edema.     Comments: LE look good today - no edema  Lymphadenopathy:     Cervical: No cervical adenopathy.  Skin:    General: Skin is warm and dry.  Neurological:     Mental Status: She is alert and oriented to person, place, and time.  Psychiatric:        Behavior: Behavior normal.               Assessment & Plan:    Encounter Diagnoses  Name Primary?  Marland Kitchen Uncontrolled type 1 diabetes mellitus with hyperglycemia ([place]) Yes  . Primary hypertension   . Nausea   . Crohn's disease with complication, unspecified gastrointestinal tract location ([place])      -rx phernergan per pt request -no medication changes today -pt to continue with her  specialists per their recommendation -she is given apidra and lantus and pentips -pt encouraged to get [facility] financial assistance application submitted asap -pt to follow up 3 months.  She is to contact office sooner prn

## 2021-04-20 ENCOUNTER — Encounter: Payer: Self-pay | Admitting: Cardiology

## 2021-04-20 NOTE — Progress Notes (Signed)
Cardiology Office Note  Date: 04/21/2021   ID: Kenli, Waldo 1996/12/03, MRN [id number]  PCP:  [name], PA-C  Cardiologist: Satira Sark, MD Electrophysiologist:  None   Chief Complaint  Patient presents with  . Evaluate for cardiomyopathy    History of Present Illness: Diamond [name] is a medically complex 25 y.o. female referred for cardiology consultation by Ms. McElroy PA-C due to concern for possible infiltrative cardiomyopathy based on echocardiogram report from April of this year.  She had been hospitalized at that time with pneumonia and MSSA sepsis.  TEE performed by Dr. Harl Bowie did not show any valvular vegetations, but her baseline echocardiogram revealed LVEF 65 to 70% with reportedly moderate LVH and appearance of myocardium suggesting the potential for infiltrative process.  She had a trivial pericardial effusion as well, mildly thickened aortic valve.  She is here today with her girlfriend.  In terms of symptoms, she states that previous edema/anasarca has improved significantly and she is back at her baseline weight.  She does report intermittent chest discomfort, not specifically exertional, overall NYHA class II dyspnea.  In terms of treatment for MSSA sepsis, she has completed IV antibiotics, PICC line removed.  She is currently on Keflex.  She is under the [facility] assistance program, no health insurance at this time.  She was denied for Medicaid but has appealed per review of PCP note.  I reviewed her medications which are noted below, she tells me that she is taking them regularly.   Past Medical History:  Diagnosis Date  . ADHD (attention deficit hyperactivity disorder) 2007  . Allergy   . Arm DVT (deep venous thromboembolism), acute, right ([place]) 04/07/2021  . Asthma   . Bipolar disorder ([place])   . Crohn's colitis ([place])    Dx Dr. Cherrie Gauze  . Diabetic nephropathy ([place])   . Edema   . Eosinophilic esophagitis   . GERD  (gastroesophageal reflux disease)   . Hypertension   . MSSA bacteremia 04/07/2021  . Nephrotic syndrome   . Pancreatitis   . Panic attacks   . PTSD (post-traumatic stress disorder)   . Stroke ([place]) 07/2020   R side  . Type 1 diabetes ([place]) 2008    Past Surgical History:  Procedure Laterality Date  . ADENOIDECTOMY    . BIOPSY  02/01/2021   Procedure: BIOPSY;  Surgeon: Rogene Houston, MD;  [facility];  Service: Endoscopy;;  duodenal, esophageal,  . BIOPSY  02/03/2021   Procedure: BIOPSY;  Surgeon: Eloise Harman, DO;  Location[facility];  Service: Endoscopy;;  . COLONOSCOPY WITH PROPOFOL N/A 02/01/2021   Procedure: COLONOSCOPY WITH PROPOFOL;  Surgeon: Rogene Houston, MD;  [facility];  Service: Endoscopy;  Laterality: N/A;  . COLONOSCOPY WITH PROPOFOL N/A 02/03/2021   Procedure: COLONOSCOPY WITH PROPOFOL;  Surgeon: Eloise Harman, DO;  [facility];  Service: Endoscopy;  Laterality: N/A;  . ESOPHAGOGASTRODUODENOSCOPY (EGD) WITH PROPOFOL N/A 02/01/2021   Procedure: ESOPHAGOGASTRODUODENOSCOPY (EGD) WITH PROPOFOL;  Surgeon: Rogene Houston, MD;  [facility];  Service: Endoscopy;  Laterality: N/A;  . HERNIA REPAIR    . TEE WITHOUT CARDIOVERSION N/A 03/09/2021   Procedure: TRANSESOPHAGEAL ECHOCARDIOGRAM (TEE) WITH PROPOFOL;  Surgeon: Arnoldo Lenis, MD;  Location[facility];  Service: Endoscopy;  Laterality: N/A;  . TONSILLECTOMY      Current Outpatient Medications  Medication Sig Dispense Refill  . acetaminophen (TYLENOL) 500 MG tablet Take  by mouth.    Marland Kitchen albuterol (VENTOLIN HFA) 108 (90 Base) MCG/ACT inhaler Inhale 2 puffs into the lungs every 6 (six) hours as needed for wheezing or shortness of breath. For shortness of breath 3 each 0  . cephALEXin (KEFLEX) 500 MG capsule Take 1 capsule (500 mg total) by mouth 4 (four) times daily. 120 capsule 0  . furosemide (LASIX) 20 MG tablet Take 20 mg by mouth daily.    Marland Kitchen gabapentin  (NEURONTIN) 100 MG capsule Take 1 capsule (100 mg total) by mouth 2 (two) times daily. 180 capsule 5  . hydrALAZINE (APRESOLINE) 25 MG tablet Take 1 tablet (25 mg total) by mouth 2 (two) times daily. 90 tablet 3  . hydrochlorothiazide (HYDRODIURIL) 25 MG tablet Take 25 mg by mouth daily.    . insulin glargine (LANTUS) 100 UNIT/ML injection Inject 40 Units into the skin at bedtime.    . Insulin Glulisine (APIDRA [place]) Inject 10-16 Units into the skin 3 (three) times daily before meals. ssi    . isosorbide mononitrate (IMDUR) 30 MG 24 hr tablet Take 0.5 tablets (15 mg total) by mouth daily. (Patient taking differently: Take 30 mg by mouth daily.)    . loperamide (IMODIUM A-D) 2 MG tablet Take 1 tablet (2 mg total) by mouth 4 (four) times daily as needed for diarrhea or loose stools. 30 tablet 0  . metoCLOPramide (REGLAN) 5 MG tablet Take 1 tablet (5 mg total) by mouth every 8 (eight) hours as needed for refractory nausea / vomiting (refractory symptoms). 90 tablet 0  . metoprolol tartrate (LOPRESSOR) 50 MG tablet Take 1 tablet (50 mg total) by mouth 2 (two) times daily. 180 tablet 0  . omeprazole (PRILOSEC) 40 MG capsule Take 1 capsule (40 mg total) by mouth in the morning and at bedtime. 18 capsule 0  . ondansetron (ZOFRAN ODT) 8 MG disintegrating tablet Take 1 tablet (8 mg total) by mouth every 8 (eight) hours as needed for nausea or vomiting. 20 tablet 2  . oxyCODONE (OXY IR/ROXICODONE) 5 MG immediate release tablet Take 1 tablet (5 mg total) by mouth every 4 (four) hours as needed for moderate pain, severe pain or breakthrough pain. 10 tablet 0  . potassium chloride (KLOR-CON) 10 MEQ tablet Take 10 mEq by mouth daily.    . promethazine (PHENERGAN) 25 MG tablet Take 1 tablet (25 mg total) by mouth every 8 (eight) hours as needed for nausea or vomiting. 20 tablet 0   No current facility-administered medications for this visit.   Allergies:  Amoxicillin, Dexamethasone, Doxycycline, Penicillins,  Ceftriaxone sodium in dextrose, Augmentin [amoxicillin-pot clavulanate], Clavulanic acid, Adhesive [tape], Latex, Other, and Pineapple   Social History: The patient  reports that she has never smoked. She has never used smokeless tobacco. She reports that she does not drink alcohol and does not use drugs.   Family History: The patient's family history includes Alcohol abuse in her father; Asthma in her maternal uncle; Cancer in her father and maternal grandmother; Crohn's disease in her father; Depression in her maternal aunt; Diabetes in her maternal grandmother and mother; Drug abuse in her father; Heart disease in her mother; Hypertension in her maternal aunt, maternal uncle, and mother; Mental illness in her father and maternal grandfather; Seizures in her maternal aunt.   ROS: No palpitations or syncope.  Physical Exam: VS:  BP (!) 138/98   Pulse (!) 105   Ht 4' 11"  (1.499 m)   Wt 119 lb (54 kg)   SpO2 99%  BMI 24.04 kg/m , BMI Body mass index is 24.04 kg/m.  Wt Readings from Last 3 Encounters:  04/21/21 119 lb (54 kg)  04/11/21 119 lb 12.8 oz (54.3 kg)  04/07/21 128 lb 14.4 oz (58.5 kg)    General: Patient appears comfortable at rest. HEENT: Conjunctiva and lids normal, wearing a mask. Neck: Supple, no elevated JVP or carotid bruits, no thyromegaly. Lungs: Clear to auscultation, nonlabored breathing at rest. Cardiac: Regular rate and rhythm, no S3, 1/6 systolic murmur, no pericardial rub. Abdomen: Soft, nontender, bowel sounds present. Extremities: Trace ankle edema, distal pulses 2+. Skin: Warm and dry. Musculoskeletal: No kyphosis. Neuropsychiatric: Alert and oriented x3, affect grossly appropriate.  ECG:  An ECG dated 04/07/2021 was personally reviewed today and demonstrated:  Sinus tachycardia with nonspecific T wave changes.  Recent Labwork: 01/30/2021: TSH 2.228 03/02/2021: B Natriuretic Peptide 89.0 04/02/2021: Magnesium 2.0 04/07/2021: ALT 9; AST 14; BUN 8; Creatinine,  Ser 1.24; Hemoglobin 10.3; Platelets 423; Potassium 3.7; Sodium 132     Component Value Date/Time   CHOL 165 03/03/2021 0436   TRIG 140 03/03/2021 0436   HDL 67 03/03/2021 0436   CHOLHDL 2.5 03/03/2021 0436   VLDL 28 03/03/2021 0436   LDLCALC 70 03/03/2021 0436    Other Studies Reviewed Today:  TEE 03/09/2021: 1. Left ventricular ejection fraction, by estimation, is 60 to 65%. The  left ventricle has normal function.  2. Right ventricular systolic function is normal. The right ventricular  size is normal.  3. No left atrial/left atrial appendage thrombus was detected. The LAA  emptying velocity was 50 cm/s.  4. The mitral valve is normal in structure. No evidence of mitral valve  regurgitation. No evidence of mitral stenosis.  5. The aortic valve is tricuspid. Aortic valve regurgitation is not  visualized. No aortic stenosis is present.  6. The inferior vena cava is normal in size with greater than 50%  respiratory variability, suggesting right atrial pressure of 3 mmHg.   Conclusion(s)/Recommendation(s): No evidence of vegetation/infective  endocarditis on this transesophageal  echocardiogram.   Echocardiogram 03/06/2021: 1. Moderate LVH, myocardium with somewhat speckled appearance. Mild valve  thickening. Trivial pericardial effusion. Findings could suggest  infiltrative cardiac disease. Consider outpatient cardiac MRI for further  evaluation. . Left ventricular ejection  fraction, by estimation, is 65 to 70%. The left ventricle has normal  function. The left ventricle has no regional wall motion abnormalities.  There is moderate left ventricular hypertrophy. Left ventricular diastolic  parameters were normal.  2. Right ventricular systolic function is normal. The right ventricular  size is normal.  3. The pericardial effusion is circumferential.  4. The mitral valve is abnormal. Trivial mitral valve regurgitation. No  evidence of mitral stenosis.  5. The aortic  valve is tricuspid. There is mild thickening of the aortic  valve. Aortic valve regurgitation is not visualized. No aortic stenosis is  present.  6. The inferior vena cava is normal in size with greater than 50%  respiratory variability, suggesting right atrial pressure of 3 mmHg.  Chest CTA 04/07/2021: IMPRESSION: No evidence of pulmonary emboli.  Mild dependent atelectatic changes.  No other focal abnormality is seen.  Upper extremity venous Dopplers 04/07/2021: Right:  No evidence of deep vein thrombosis in the upper extremity. 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    Left:  No evidence of thrombosis in the subclavian.   Assessment and Plan:  1.  Moderate LVH by echocardiogram with  question raised as to possibility of infiltrative or hypertrophic cardiomyopathy.  Trivial pericardial effusion noted which is nonspecific and some thickening of the aortic valve, but no major valvular abnormalities.  Most likely the effects of hypertension and type 1 diabetes mellitus on the myocardium would be suspected rather than a primary myocardial disorder, however clarification could likely be achieved with a cardiac MRI presuming she can tolerate this.  We discussed further testing today and she is in agreement.  I encouraged her to take her medications regularly otherwise.  2.  Essential hypertension by history.  She is currently on hydralazine, HCTZ, Lopressor, and Imdur.  3.  History of nephrotic syndrome with anasarca.  Fluid status significantly improved, she states that she is at her baseline.  She is on Lasix daily, most recent creatinine 1.24.  She follows with Dr. Theador Hawthorne.  4.  History of pneumonia and MSSA sepsis as discussed above, no valvular vegetations by TEE in April.  5.  Type 1 diabetes mellitus, followed by Dr. Dorris Fetch.  She is on Apidra and Lantus.  Hemoglobin A1c 8.7% in April down from a peak greater than 15.5% in  September 2021.  6.  History of IBD versus indeterminate colitis with question of Crohn's disease, also eosinophilic esophagitis.  She is following with gastroenterology.  Medication Adjustments/Labs and Tests Ordered: Current medicines are reviewed at length with the patient today.  Concerns regarding medicines are outlined above.   Tests Ordered: Orders Placed This Encounter  Procedures  . MR Card Morphology Wo/W Cm    Medication Changes: No orders of the defined types were placed in this encounter.   Disposition:  Follow up test results.  Signed, Satira Sark, MD, [facility] 04/21/2021 9:41 AM    [facility] [place], [place], [place] [address] Phone: [phone]; Fax: [phone]

## 2021-04-21 ENCOUNTER — Other Ambulatory Visit: Payer: Self-pay

## 2021-04-21 ENCOUNTER — Ambulatory Visit (INDEPENDENT_AMBULATORY_CARE_PROVIDER_SITE_OTHER): Payer: Self-pay | Admitting: Cardiology

## 2021-04-21 ENCOUNTER — Encounter: Payer: Self-pay | Admitting: Cardiology

## 2021-04-21 VITALS — BP 138/98 | HR 105 | Ht 59.0 in | Wt 119.0 lb

## 2021-04-21 DIAGNOSIS — Z87441 Personal history of nephrotic syndrome: Secondary | ICD-10-CM

## 2021-04-21 DIAGNOSIS — I422 Other hypertrophic cardiomyopathy: Secondary | ICD-10-CM

## 2021-04-21 DIAGNOSIS — I517 Cardiomegaly: Secondary | ICD-10-CM

## 2021-04-21 DIAGNOSIS — E1065 Type 1 diabetes mellitus with hyperglycemia: Secondary | ICD-10-CM

## 2021-04-21 DIAGNOSIS — I10 Essential (primary) hypertension: Secondary | ICD-10-CM

## 2021-04-21 NOTE — Addendum Note (Signed)
Addended by: Laurine Blazer on: 04/21/2021 05:54 PM   Modules accepted: Orders

## 2021-04-21 NOTE — Patient Instructions (Signed)
Medication Instructions:  Continue all current medications.  Labwork: none  Testing/Procedures:  Cardiac MRI   Office will contact with results via phone or letter.    Follow-Up: Pending test results   Any Other Special Instructions Will Be Listed Below (If Applicable).  If you need a refill on your cardiac medications before your next appointment, please call your pharmacy.

## 2021-05-03 ENCOUNTER — Ambulatory Visit: Payer: Self-pay | Admitting: Infectious Disease

## 2021-05-04 ENCOUNTER — Telehealth: Payer: Self-pay

## 2021-05-04 NOTE — Telephone Encounter (Signed)
Client returned call for follow up. Reviewed with client upcoming appointments. She has Infectious Disease on 05/12/21 discussed transportation. Client reports her aunt will be taking her. Discussed that if Aunt would not be able to take her to call this CM with [facility] at least 2 to 3 days before and we could arrange transportation with [employer]. Client reports understanding. Inquired about her Cardiology appointment from 04/21/21 and she states she will have to have an MRI but does not know yet. She received a letter, but states she has not opened it.  Ms Grandmaison reports her blood sugars are doing "better" Her appointment with [facility] ([facility] are in August.  Client reports being sleepy and just awake, call ended. Reminded client to keep [facility] informed also of all new appointments and upcoming appointments as documentation is being noted for appeal process for Disability and Medicaid.   Debria Garret RN [facility] [employer]

## 2021-05-04 NOTE — Telephone Encounter (Signed)
Attempted call to follow up with client , no answer, left voicemail.  Debria Garret RN [facility] [employer]

## 2021-05-10 ENCOUNTER — Telehealth: Payer: Self-pay

## 2021-05-10 NOTE — Telephone Encounter (Signed)
Attempted to call client for follow up. No answer, left VM requesting return call.  Debria Garret RN [facility] [employer]

## 2021-05-11 ENCOUNTER — Telehealth: Payer: Self-pay | Admitting: Physician Assistant

## 2021-05-11 NOTE — Telephone Encounter (Signed)
   CHAVY AVERA DOB: 04-Mar-1996 MRN: [id number]   RIDER WAIVER AND RELEASE OF LIABILITY  For purposes of improving physical access to our facilities, [facility] is pleased to partner with third parties to provide [facility] patients or other authorized individuals the option of convenient, on-demand ground transportation services (the [place]") through use of the technology service that enables users to request on-demand ground transportation from independent third-party providers.  By opting to use and accept these [employer], I, the undersigned, hereby agree on behalf of myself, and on behalf of any minor child using the Government social research officer for whom I am the parent or legal guardian, as follows:  Government social research officer provided to me are provided by independent third-party transportation providers who are not [employer] or employees and who are unaffiliated with [employer]. [place] is neither a transportation carrier nor a common or public carrier. [facility] has no control over the quality or safety of the transportation that occurs as a result of the [employer]. [facility] [place] [facility] cannot guarantee that any third-party transportation provider will complete any arranged transportation service. [facility] makes no representation, warranty, or guarantee regarding the reliability, timeliness, quality, safety, suitability, or availability of any of the Transport Services or that they will be error free. I fully understand that traveling by vehicle involves risks and dangers of serious bodily injury, including permanent disability, paralysis, and death. I agree, on behalf of myself and on behalf of any minor child using the Transport Services for whom I am the parent or legal guardian, that the entire risk arising out of my use of the [employer] remains solely with me, to the maximum extent permitted under applicable law. The [employer] are provided "as  is" and "as available." [facility] disclaims all representations and warranties, express, implied or statutory, not expressly set out in these terms, including the implied warranties of merchantability and fitness for a particular purpose. I hereby waive and release [facility], its agents, employees, officers, directors, representatives, insurers, attorneys, assigns, successors, subsidiaries, and affiliates from any and all past, present, or future claims, demands, liabilities, actions, causes of action, or suits of any kind directly or indirectly arising from acceptance and use of the [employer]. I further waive and release  and its affiliates from all present and future liability and responsibility for any injury or death to persons or damages to property caused by or related to the use of the [employer]. I have read this Waiver and Release of Liability, and I understand the terms used in it and their legal significance. This Waiver is freely and voluntarily given with the understanding that my right (as well as the right of any minor child for whom I am the parent or legal guardian using the [employer]) to legal recourse against  in connection with the [employer] is knowingly surrendered in return for use of these services.   I attest that I read the consent document to [name], gave Ms. Criger the opportunity to ask questions and answered the questions asked (if any). I affirm that [name] then provided consent for she's participation in this program.     Darrick Meigs V[place]aint

## 2021-05-12 ENCOUNTER — Telehealth: Payer: Self-pay

## 2021-05-12 ENCOUNTER — Other Ambulatory Visit: Payer: Self-pay

## 2021-05-12 ENCOUNTER — Ambulatory Visit (INDEPENDENT_AMBULATORY_CARE_PROVIDER_SITE_OTHER): Payer: Self-pay | Admitting: Infectious Disease

## 2021-05-12 VITALS — BP 122/87 | HR 74 | Temp 97.9°F | Resp 16 | Ht 59.0 in | Wt 122.0 lb

## 2021-05-12 DIAGNOSIS — B9561 Methicillin susceptible Staphylococcus aureus infection as the cause of diseases classified elsewhere: Secondary | ICD-10-CM

## 2021-05-12 DIAGNOSIS — G8929 Other chronic pain: Secondary | ICD-10-CM

## 2021-05-12 DIAGNOSIS — M545 Low back pain, unspecified: Secondary | ICD-10-CM

## 2021-05-12 DIAGNOSIS — J189 Pneumonia, unspecified organism: Secondary | ICD-10-CM

## 2021-05-12 DIAGNOSIS — E1065 Type 1 diabetes mellitus with hyperglycemia: Secondary | ICD-10-CM

## 2021-05-12 DIAGNOSIS — R7881 Bacteremia: Secondary | ICD-10-CM

## 2021-05-12 NOTE — Progress Notes (Signed)
Subjective:  Chief complaint: Continued back pain though it is improved   Patient ID: Diamond [name], female    DOB: Mar 13, 1996, 25 y.o.   MRN: [id number]  HPI  17y ear-old black woman living with diabetes mellitus who had severe low back pain and was found to have MSSA bacteremia and multifocal pneumonia.  Transesophageal echocardiogram failed to show evidence of endocarditis and imaging of the spine failed to show evidence of discitis.  We decided to err on the side of treating her for presumptive discitis with cefazolin.  She received the cefazolin but not for the entire duration we had desired because apparently charity care would no longer pay for medications.  In the interim she was having trouble with her PICC line not flushing.  She came to the ER o have this evaluated but then left before any substantive evaluation could be done.  She presented to clinic the last time I saw her without an appointment but we saw her as a work in.  On exam she clearly has a tender painful area proximal to her PICC line which is very concerning for a DVT.  She also has been complaining of intermittent burning chest pain as well.  I directed her to go to the emergency department so this can be evaluated and that she can be started on blood thinners as I am certain she has a deep venous thrombosis.  She ended up leaving once again but eventually did come back to the ER and the PICC line was removed at my recommendation she was placed on oral Keflex which she has continued through this date.  She has a couple of pills left.  She still has some back pain though it is much improved compared to when she was in the hospital.    Past Medical History:  Diagnosis Date   ADHD (attention deficit hyperactivity disorder) 2007   Allergy    Arm DVT (deep venous thromboembolism), acute, right ([place]) 04/07/2021   Asthma    Bipolar disorder ([place])    Crohn's colitis ([place])    Dx Dr. Cherrie Gauze   Diabetic  nephropathy ([place])    Edema    Eosinophilic esophagitis    GERD (gastroesophageal reflux disease)    Hypertension    MSSA bacteremia 04/07/2021   Nephrotic syndrome    Pancreatitis    Panic attacks    PTSD (post-traumatic stress disorder)    Stroke ([place]) 07/2020   R side   Type 1 diabetes ([place]) 2008    Past Surgical History:  Procedure Laterality Date   ADENOIDECTOMY     BIOPSY  02/01/2021   Procedure: BIOPSY;  Surgeon: Rogene Houston, MD;  [facility];  Service: Endoscopy;;  duodenal, esophageal,   BIOPSY  02/03/2021   Procedure: BIOPSY;  Surgeon: Eloise Harman, DO;  [facility];  Service: Endoscopy;;   COLONOSCOPY WITH PROPOFOL N/A 02/01/2021   Procedure: COLONOSCOPY WITH PROPOFOL;  Surgeon: Rogene Houston, MD;  [facility];  Service: Endoscopy;  Laterality: N/A;   COLONOSCOPY WITH PROPOFOL N/A 02/03/2021   Procedure: COLONOSCOPY WITH PROPOFOL;  Surgeon: Eloise Harman, DO;  [facility];  Service: Endoscopy;  Laterality: N/A;   ESOPHAGOGASTRODUODENOSCOPY (EGD) WITH PROPOFOL N/A 02/01/2021   Procedure: ESOPHAGOGASTRODUODENOSCOPY (EGD) WITH PROPOFOL;  Surgeon: Rogene Houston, MD;  [facility];  Service: Endoscopy;  Laterality: N/A;   HERNIA REPAIR     TEE WITHOUT CARDIOVERSION N/A 03/09/2021  Procedure: TRANSESOPHAGEAL ECHOCARDIOGRAM (TEE) WITH PROPOFOL;  Surgeon: Arnoldo Lenis, MD;  [facility];  Service: Endoscopy;  Laterality: N/A;   TONSILLECTOMY      Family History  Problem Relation Age of Onset   Hypertension Mother    Diabetes Mother    Heart disease Mother    Crohn's disease Father    Alcohol abuse Father    Drug abuse Father    Mental illness Father    Cancer Father    Hypertension Maternal Aunt    Seizures Maternal Aunt    Depression Maternal Aunt    Asthma Maternal Uncle    Hypertension Maternal Uncle    Cancer Maternal Grandmother    Diabetes Maternal Grandmother    Mental  illness Maternal Grandfather       Social History   Socioeconomic History   Marital status: Single    Spouse name: Not on file   Number of children: Not on file   Years of education: Not on file   Highest education level: Not on file  Occupational History   Not on file  Tobacco Use   Smoking status: Never   Smokeless tobacco: Never  Vaping Use   Vaping Use: Never used  Substance and Sexual Activity   Alcohol use: No   Drug use: No   Sexual activity: Yes  Other Topics Concern   Not on file  Social History Narrative   Not on file   Social Determinants of Health   Financial Resource Strain: Not on file  Food Insecurity: Not on file  Transportation Needs: Not on file  Physical Activity: Not on file  Stress: Not on file  Social Connections: Not on file    Allergies  Allergen Reactions   Amoxicillin Hives    Did it involve swelling of the face/tongue/throat, SOB, or low BP? no  Did it involve sudden or severe rash/hives, skin peeling, or any reaction on the inside of your mouth or nose? no Did you need to seek medical attention at a hospital or doctor's office? no When did it last happen?   unk    If all above answers are "NO", may proceed with cephalosporin use.   Dexamethasone Hives and Itching   Doxycycline Itching and Swelling   Penicillins Hives and Itching    Did it involve swelling of the face/tongue/throat, SOB, or low BP? no  Did it involve sudden or severe rash/hives, skin peeling, or any reaction on the inside of your mouth or nose? no Did you need to seek medical attention at a hospital or doctor's office? no When did it last happen?   unk    If all above answers are "NO", may proceed with cephalosporin use. Did it involve swelling of the face/tongue/throat, SOB, or low BP? no  Did it involve sudden or severe rash/hives, skin peeling, or any reac   Ceftriaxone Sodium In Dextrose Itching   Augmentin [Amoxicillin-Pot Clavulanate] Other (See Comments)     Reaction unknown   Clavulanic Acid    Adhesive [Tape] Itching and Rash   Latex Itching and Rash   Other Itching and Rash   Pineapple Rash    Rash on tongue and throat     Current Outpatient Medications:    acetaminophen (TYLENOL) 500 MG tablet, Take by mouth., Disp: , Rfl:    albuterol (VENTOLIN HFA) 108 (90 Base) MCG/ACT inhaler, Inhale 2 puffs into the lungs every 6 (six) hours as needed for wheezing or shortness of breath.  For shortness of breath, Disp: 3 each, Rfl: 0   furosemide (LASIX) 20 MG tablet, Take 20 mg by mouth daily., Disp: , Rfl:    gabapentin (NEURONTIN) 100 MG capsule, Take 1 capsule (100 mg total) by mouth 2 (two) times daily., Disp: 180 capsule, Rfl: 5   hydrALAZINE (APRESOLINE) 25 MG tablet, Take 1 tablet (25 mg total) by mouth 2 (two) times daily., Disp: 90 tablet, Rfl: 3   hydrochlorothiazide (HYDRODIURIL) 25 MG tablet, Take 25 mg by mouth daily., Disp: , Rfl:    insulin glargine (LANTUS) 100 UNIT/ML injection, Inject 40 Units into the skin at bedtime., Disp: , Rfl:    Insulin Glulisine (APIDRA [place]), Inject 10-16 Units into the skin 3 (three) times daily before meals. ssi, Disp: , Rfl:    isosorbide mononitrate (IMDUR) 30 MG 24 hr tablet, Take 0.5 tablets (15 mg total) by mouth daily. (Patient taking differently: Take 30 mg by mouth daily.), Disp: , Rfl:    loperamide (IMODIUM A-D) 2 MG tablet, Take 1 tablet (2 mg total) by mouth 4 (four) times daily as needed for diarrhea or loose stools., Disp: 30 tablet, Rfl: 0   metoCLOPramide (REGLAN) 5 MG tablet, Take 1 tablet (5 mg total) by mouth every 8 (eight) hours as needed for refractory nausea / vomiting (refractory symptoms)., Disp: 90 tablet, Rfl: 0   metoprolol tartrate (LOPRESSOR) 50 MG tablet, Take 1 tablet (50 mg total) by mouth 2 (two) times daily., Disp: 180 tablet, Rfl: 0   omeprazole (PRILOSEC) 40 MG capsule, Take 1 capsule (40 mg total) by mouth in the morning and at bedtime., Disp: 18 capsule, Rfl: 0    ondansetron (ZOFRAN ODT) 8 MG disintegrating tablet, Take 1 tablet (8 mg total) by mouth every 8 (eight) hours as needed for nausea or vomiting., Disp: 20 tablet, Rfl: 2   oxyCODONE (OXY IR/ROXICODONE) 5 MG immediate release tablet, Take 1 tablet (5 mg total) by mouth every 4 (four) hours as needed for moderate pain, severe pain or breakthrough pain., Disp: 10 tablet, Rfl: 0   potassium chloride (KLOR-CON) 10 MEQ tablet, Take 10 mEq by mouth daily., Disp: , Rfl:    promethazine (PHENERGAN) 25 MG tablet, Take 1 tablet (25 mg total) by mouth every 8 (eight) hours as needed for nausea or vomiting., Disp: 20 tablet, Rfl: 0   cephALEXin (KEFLEX) 500 MG capsule, TAKE 1 CAPSULE BY MOUTH 4 TIMES DAILY (Patient not taking: Reported on 05/12/2021), Disp: , Rfl:    Review of Systems  Constitutional:  Negative for activity change, appetite change, chills, diaphoresis, fatigue, fever and unexpected weight change.  HENT:  Negative for congestion, rhinorrhea, sinus pressure, sneezing, sore throat and trouble swallowing.   Eyes:  Negative for photophobia and visual disturbance.  Respiratory:  Negative for cough, chest tightness, shortness of breath, wheezing and stridor.   Cardiovascular:  Negative for chest pain, palpitations and leg swelling.  Gastrointestinal:  Negative for abdominal distention, abdominal pain, anal bleeding, blood in stool, constipation, diarrhea, nausea and vomiting.  Genitourinary:  Negative for difficulty urinating, dysuria, flank pain and hematuria.  Musculoskeletal:  Positive for back pain. Negative for arthralgias, gait problem, joint swelling and myalgias.  Skin:  Negative for color change, pallor, rash and wound.  Neurological:  Negative for dizziness, tremors, weakness and light-headedness.  Hematological:  Negative for adenopathy. Does not bruise/bleed easily.  Psychiatric/Behavioral:  Negative for agitation, behavioral problems, confusion, decreased concentration, dysphoric mood and  sleep disturbance.       Objective:  Physical Exam Constitutional:      General: She is not in acute distress.    Appearance: Normal appearance. She is well-developed. She is not ill-appearing or diaphoretic.  HENT:     Head: Normocephalic and atraumatic.     Right Ear: Hearing and external ear normal.     Left Ear: Hearing and external ear normal.     Nose: No nasal deformity or rhinorrhea.  Eyes:     General: No scleral icterus.    Conjunctiva/sclera: Conjunctivae normal.     Right eye: Right conjunctiva is not injected.     Left eye: Left conjunctiva is not injected.     Pupils: Pupils are equal, round, and reactive to light.  Neck:     Vascular: No JVD.  Cardiovascular:     Rate and Rhythm: Normal rate and regular rhythm.     Heart sounds: Normal heart sounds, S1 normal and S2 normal. No murmur heard.   No friction rub.  Pulmonary:     Effort: Pulmonary effort is normal. No respiratory distress.     Breath sounds: No stridor. No wheezing or rhonchi.  Chest:     Chest wall: No tenderness.  Abdominal:     General: Bowel sounds are normal. There is no distension.     Palpations: Abdomen is soft.  Musculoskeletal:        General: Normal range of motion.     Right shoulder: Normal.     Left shoulder: Normal.     Cervical back: Normal range of motion and neck supple.     Right hip: Normal.     Left hip: Normal.     Right knee: Normal.     Left knee: Normal.  Lymphadenopathy:     Head:     Right side of head: No submandibular, preauricular or posterior auricular adenopathy.     Left side of head: No submandibular, preauricular or posterior auricular adenopathy.     Cervical: No cervical adenopathy.     Right cervical: No superficial or deep cervical adenopathy.    Left cervical: No superficial or deep cervical adenopathy.  Skin:    General: Skin is warm and dry.     Coloration: Skin is not pale.     Findings: No abrasion, bruising, ecchymosis, erythema, lesion or  rash.     Nails: There is no clubbing.  Neurological:     Mental Status: She is alert and oriented to person, place, and time.     Sensory: No sensory deficit.     Coordination: Coordination normal.     Gait: Gait normal.  Psychiatric:        Attention and Perception: Attention normal. She is attentive.        Mood and Affect: Mood is depressed.        Speech: Speech normal.        Behavior: Behavior normal. Behavior is cooperative.        Thought Content: Thought content normal.        Judgment: Judgment normal.          Assessment & Plan:    Possible discitis: Her back pain improved and she is taken Keflex since I last saw her.  We will check labs today but I do not think she needs further antibiotics or further evaluation with imaging.  Arm pain: resolved  DM: Mains poorly controlled at last check.  I spent more than30 minutes with the patient including greater than 50% of time  in face to face counseling of the patient personally reviewing radiographs, along with pertinent laboratory microbiological data review of medical records and in coordination of his care.

## 2021-05-12 NOTE — Telephone Encounter (Signed)
R[place] client's community paramedic with [facility]. Client is to have blood work drawn next Thursday for Dr Theador Hawthorne. Client will need transportation. Confirmed that client will have Lab order sheet with her.  Will call [facility] [employer] to confirm transportation to [facility] lab on 05/18/21 at 0900.  [facility] will remind client that she cannot forget or lose order sheet for labs.  Lydia [employer]

## 2021-05-13 LAB — C-REACTIVE PROTEIN: CRP: 1 mg/L (ref <8.0)

## 2021-05-13 LAB — SEDIMENTATION RATE: Sed Rate: 80 mm/h — ABNORMAL HIGH (ref 0–20)

## 2021-05-15 ENCOUNTER — Telehealth: Payer: Self-pay

## 2021-05-15 ENCOUNTER — Telehealth (HOSPITAL_COMMUNITY): Payer: Self-pay | Admitting: *Deleted

## 2021-05-15 NOTE — Telephone Encounter (Signed)
Patient made aware of results and accepts follow up visit with Dr. Tommy Medal [facility]

## 2021-05-15 NOTE — Telephone Encounter (Signed)
-----   Message from Truman Hayward, MD sent at 05/15/2021 11:54 AM EDT ----- Her inflammatory markers still high. Can she come back to clinic tos eem me or a partner in next 3-4 weeks? ----- Message ----- From: Interface, [facility] Lab Results In Sent: 05/12/2021  11:39 PM EDT To: Truman Hayward, MD

## 2021-05-15 NOTE — Telephone Encounter (Signed)
Reaching out to patient to offer assistance regarding upcoming cardiac imaging study; pt verbalizes understanding of appt date/time, parking situation and where to check in, and verified current allergies; name and call back number provided for further questions should they arise  Danna Sewell RN Navigator Cardiac Imaging [facility] [phone] office [phone] cell  

## 2021-05-16 ENCOUNTER — Other Ambulatory Visit: Payer: Self-pay

## 2021-05-16 ENCOUNTER — Encounter (HOSPITAL_COMMUNITY): Payer: Self-pay | Admitting: Emergency Medicine

## 2021-05-16 ENCOUNTER — Emergency Department (HOSPITAL_COMMUNITY)
Admission: EM | Admit: 2021-05-16 | Discharge: 2021-05-16 | Disposition: A | Discharge status: Home / Self Care | Payer: Medicaid Other | Attending: Emergency Medicine | Admitting: Emergency Medicine

## 2021-05-16 DIAGNOSIS — Z79899 Other long term (current) drug therapy: Secondary | ICD-10-CM | POA: Insufficient documentation

## 2021-05-16 DIAGNOSIS — Z91040 Latex allergy status: Secondary | ICD-10-CM | POA: Insufficient documentation

## 2021-05-16 DIAGNOSIS — I10 Essential (primary) hypertension: Secondary | ICD-10-CM | POA: Insufficient documentation

## 2021-05-16 DIAGNOSIS — G8929 Other chronic pain: Secondary | ICD-10-CM | POA: Insufficient documentation

## 2021-05-16 DIAGNOSIS — Z794 Long term (current) use of insulin: Secondary | ICD-10-CM | POA: Insufficient documentation

## 2021-05-16 DIAGNOSIS — M545 Low back pain, unspecified: Secondary | ICD-10-CM | POA: Insufficient documentation

## 2021-05-16 DIAGNOSIS — E1043 Type 1 diabetes mellitus with diabetic autonomic (poly)neuropathy: Secondary | ICD-10-CM | POA: Insufficient documentation

## 2021-05-16 DIAGNOSIS — J45909 Unspecified asthma, uncomplicated: Secondary | ICD-10-CM | POA: Insufficient documentation

## 2021-05-16 MED ORDER — OXYCODONE-ACETAMINOPHEN 5-325 MG PO TABS: 1.0000 | ORAL_TABLET | Freq: Three times a day (TID) | ORAL | 0 refills | Status: DC | PRN

## 2021-05-16 NOTE — ED Provider Notes (Signed)
[facility] Provider Note   CSN: [id number] Arrival date & time: 05/16/21  1427     History Chief Complaint  Patient presents with   Back Pain    Diamond [name] is a 25 y.o. female.   Back Pain Associated symptoms: no abdominal pain, no dysuria, no fever and no weakness   Patient presents with back pain.  Acute on chronic.  States that she has a history of infection back.  Recently seen infectious disease.  Reviewing records appears that her sed rate was still elevated although CRP has normalized.  Plan was to follow-up in 3 weeks.  Had not continued antibiotics.  Patient states the pain continues in her left lower back.  Also in most of her back.  Similar pain that she has been having may be a little more severe.  States that she needs pain medicines and that the [facility] clinic and the ID clinic will not prescribe it to her and was told to come to the hospital to get her narcotics.  Denies having fevers.  States it is hurting more when she is in certain positions and it was previously.    Past Medical History:  Diagnosis Date   ADHD (attention deficit hyperactivity disorder) 2007   Allergy    Arm DVT (deep venous thromboembolism), acute, right ([place]) 04/07/2021   Asthma    Bipolar disorder ([place])    Crohn's colitis ([place])    Dx Dr. Cherrie Gauze   Diabetic nephropathy ([place])    Edema    Eosinophilic esophagitis    GERD (gastroesophageal reflux disease)    Hypertension    MSSA bacteremia 04/07/2021   Nephrotic syndrome    Pancreatitis    Panic attacks    PTSD (post-traumatic stress disorder)    Stroke ([place]) 07/2020   R side   Type 1 diabetes ([place]) 2008    Patient Active Problem List   Diagnosis Date Noted   Diarrhea 04/11/2021   Upper abdominal pain 21/22/4825   Eosinophilic esophagitis 00/37/0488   MSSA bacteremia 04/07/2021   Arm DVT (deep venous thromboembolism), acute, right ([place]) 04/07/2021   AKI (acute kidney injury) ([place]) 04/01/2021    Pyelonephritis 03/31/2021   Lumbago    Intractable nausea and vomiting 03/16/2021   Nausea with vomiting    DM1 with Diabetic nephropathy with proteinuria and Nephrotic Syndrome- 03/10/2021    Class: Chronic   Multifocal pneumonia 03/05/2021   Bladder outlet obstruction    Constipation    Abdominal pain, epigastric    Abnormal computed tomography of cecum and terminal ileum    Hyperosmolar hyperglycemic state (HHS) ([place]) 01/30/2021   Crohn's disease ([place]) 12/08/2020   Diabetic gastroparesis ([place]) 12/08/2020   Essential hypertension, benign 11/01/2020   Edema 10/19/2020   Anasarca 10/18/2020   Hypoalbuminemia-due to proteinuria/nephrotic syndrome 10/18/2020   Personal history of noncompliance with medical treatment, presenting hazards to health 10/12/2020   Bilateral lower extremity edema 08/25/2020   Pneumonia 08/25/2020   Eczema 10/23/2012   Abdominal pain 08/27/2012   GERD (gastroesophageal reflux disease) 07/25/2012   Mood disorder ([place]) 07/09/2012   Contraception management 07/09/2012   History of Crohn's disease 07/09/2012   Uncontrolled type 1 diabetes mellitus with hyperglycemia and Diabetic Nephropathy 07/08/2012    Class: Chronic   Asthma, persistent controlled 07/08/2012    Past Surgical History:  Procedure Laterality Date   ADENOIDECTOMY     BIOPSY  02/01/2021   Procedure: BIOPSY;  Surgeon: Rogene Houston, MD;  [facility];  Service: Endoscopy;;  duodenal, esophageal,   BIOPSY  02/03/2021   Procedure: BIOPSY;  Surgeon: Eloise Harman, DO;  [facility];  Service: Endoscopy;;   COLONOSCOPY WITH PROPOFOL N/A 02/01/2021   Procedure: COLONOSCOPY WITH PROPOFOL;  Surgeon: Rogene Houston, MD;  [facility];  Service: Endoscopy;  Laterality: N/A;   COLONOSCOPY WITH PROPOFOL N/A 02/03/2021   Procedure: COLONOSCOPY WITH PROPOFOL;  Surgeon: Eloise Harman, DO;  [facility];  Service: Endoscopy;  Laterality: N/A;    ESOPHAGOGASTRODUODENOSCOPY (EGD) WITH PROPOFOL N/A 02/01/2021   Procedure: ESOPHAGOGASTRODUODENOSCOPY (EGD) WITH PROPOFOL;  Surgeon: Rogene Houston, MD;  [facility];  Service: Endoscopy;  Laterality: N/A;   HERNIA REPAIR     TEE WITHOUT CARDIOVERSION N/A 03/09/2021   Procedure: TRANSESOPHAGEAL ECHOCARDIOGRAM (TEE) WITH PROPOFOL;  Surgeon: Arnoldo Lenis, MD;  [facility];  Service: Endoscopy;  Laterality: N/A;   TONSILLECTOMY       OB History   No obstetric history on file.     Family History  Problem Relation Age of Onset   Hypertension Mother    Diabetes Mother    Heart disease Mother    Crohn's disease Father    Alcohol abuse Father    Drug abuse Father    Mental illness Father    Cancer Father    Hypertension Maternal Aunt    Seizures Maternal Aunt    Depression Maternal Aunt    Asthma Maternal Uncle    Hypertension Maternal Uncle    Cancer Maternal Grandmother    Diabetes Maternal Grandmother    Mental illness Maternal Grandfather     Social History   Tobacco Use   Smoking status: Never   Smokeless tobacco: Never  Vaping Use   Vaping Use: Never used  Substance Use Topics   Alcohol use: No   Drug use: No    Home Medications Prior to Admission medications   Medication Sig Start Date End Date Taking? Authorizing Provider  oxyCODONE-acetaminophen (PERCOCET/ROXICET) 5-325 MG tablet Take 1 tablet by mouth every 8 (eight) hours as needed for severe pain. 05/16/21  Yes Davonna Belling, MD  acetaminophen (TYLENOL) 500 MG tablet Take by mouth. 04/21/20   [provider]  albuterol (VENTOLIN HFA) 108 (90 Base) MCG/ACT inhaler Inhale 2 puffs into the lungs every 6 (six) hours as needed for wheezing or shortness of breath. For shortness of breath 03/10/21   Roxan Hockey, MD  cephALEXin (KEFLEX) 500 MG capsule TAKE 1 CAPSULE BY MOUTH 4 TIMES DAILY Patient not taking: Reported on 05/12/2021 04/11/21   [provider]  furosemide  (LASIX) 20 MG tablet Take 20 mg by mouth daily. 04/25/20   [provider]  gabapentin (NEURONTIN) 100 MG capsule Take 1 capsule (100 mg total) by mouth 2 (two) times daily. 03/10/21   Roxan Hockey, MD  hydrALAZINE (APRESOLINE) 25 MG tablet Take 1 tablet (25 mg total) by mouth 2 (two) times daily. 03/10/21   Roxan Hockey, MD  hydrochlorothiazide (HYDRODIURIL) 25 MG tablet Take 25 mg by mouth daily. 02/11/20   [provider]  insulin glargine (LANTUS) 100 UNIT/ML injection Inject 40 Units into the skin at bedtime.    [provider]  Insulin Glulisine (APIDRA [place]) Inject 10-16 Units into the skin 3 (three) times daily before meals. ssi    [provider]  isosorbide mononitrate (IMDUR) 30 MG 24 hr tablet Take 0.5 tablets (15 mg total) by mouth daily.  Patient taking differently: Take 30 mg by mouth daily. 03/19/21   Barton Dubois, MD  loperamide (IMODIUM A-D) 2 MG tablet Take 1 tablet (2 mg total) by mouth 4 (four) times daily as needed for diarrhea or loose stools. 04/02/21   Manuella Ghazi, Pratik D, DO  metoCLOPramide (REGLAN) 5 MG tablet Take 1 tablet (5 mg total) by mouth every 8 (eight) hours as needed for refractory nausea / vomiting (refractory symptoms). 03/19/21 03/19/22  Barton Dubois, MD  metoprolol tartrate (LOPRESSOR) 50 MG tablet Take 1 tablet (50 mg total) by mouth 2 (two) times daily. 03/23/21   [name], PA-C  omeprazole (PRILOSEC) 40 MG capsule Take 1 capsule (40 mg total) by mouth in the morning and at bedtime. 03/23/21   [name], PA-C  ondansetron (ZOFRAN ODT) 8 MG disintegrating tablet Take 1 tablet (8 mg total) by mouth every 8 (eight) hours as needed for nausea or vomiting. 03/10/21   Roxan Hockey, MD  oxyCODONE (OXY IR/ROXICODONE) 5 MG immediate release tablet Take 1 tablet (5 mg total) by mouth every 4 (four) hours as needed for moderate pain, severe pain or breakthrough pain. 04/02/21   Manuella Ghazi, Pratik D, DO  potassium chloride (KLOR-CON) 10  MEQ tablet Take 10 mEq by mouth daily. 10/21/20   [provider]  promethazine (PHENERGAN) 25 MG tablet Take 1 tablet (25 mg total) by mouth every 8 (eight) hours as needed for nausea or vomiting. 04/18/21   [name], PA-C    Allergies    Amoxicillin, Dexamethasone, Doxycycline, Penicillins, Ceftriaxone sodium in dextrose, Augmentin [amoxicillin-pot clavulanate], Clavulanic acid, Adhesive [tape], Latex, Other, and Pineapple  Review of Systems   Review of Systems  Constitutional:  Negative for appetite change and fever.  HENT:  Negative for congestion.   Gastrointestinal:  Negative for abdominal pain.  Genitourinary:  Negative for dysuria.  Musculoskeletal:  Positive for back pain.  Skin:  Negative for pallor.  Neurological:  Negative for weakness.  Psychiatric/Behavioral:  Negative for confusion.    Physical Exam Updated Vital Signs BP (!) 122/93 (BP Location: Left Arm)   Pulse (!) 107   Temp 98.4 F (36.9 C) (Oral)   Resp 16   Ht 4' 11"  (1.499 m)   Wt 54 kg   LMP  (LMP Unknown)   SpO2 100%   BMI 24.04 kg/m   Physical Exam Vitals and nursing note reviewed.  HENT:     Head: Atraumatic.  Eyes:     Pupils: Pupils are equal, round, and reactive to light.  Cardiovascular:     Rate and Rhythm: Regular rhythm.  Pulmonary:     Breath sounds: No wheezing or rhonchi.  Abdominal:     Tenderness: There is no abdominal tenderness.  Musculoskeletal:        General: Tenderness present.     Cervical back: Neck supple.     Comments: Tender from mid thoracic to lumbar spine.  Also left paraspinal tenderness.  No erythema.  No induration.  Skin:    General: Skin is warm.     Capillary Refill: Capillary refill takes less than 2 seconds.  Neurological:     Mental Status: She is alert.    ED Results / Procedures / Treatments   Labs (all labs ordered are listed, but only abnormal results are displayed) Labs Reviewed - No data to  display  EKG None  Radiology No results found.  Procedures Procedures   Medications Ordered in ED Medications - No data to display  ED Course  I have reviewed the triage vital signs and the nursing notes.  Pertinent labs & imaging results that were available during my care of the patient were reviewed by me and considered in my medical decision making (see chart for details).    MDM Rules/Calculators/A&P                          Patient presents with chronic low back pain.  May be a little more severe.  Seen by ID few days ago.  Had inflammatory markers that were reassuring at that time although sed rate was elevated at 88 has come down from 120.  CRP is normalized.  Discussed with Dr. Chana Bode for infectious disease.  Does not feel as if we need further work-up at this time since pain is overall similar to what it has been.  Patient is mostly just requesting pain meds.  Will give a short course.  Outpatient follow-up as needed.  Drug database reviewed.  New discitis or meningitis felt less likely. Final Clinical Impression(s) / ED Diagnoses Final diagnoses:  Acute low back pain, unspecified back pain laterality, unspecified whether sciatica present    Rx / DC Orders ED Discharge Orders          Ordered    oxyCODONE-acetaminophen (PERCOCET/ROXICET) 5-325 MG tablet  Every 8 hours PRN        05/16/21 1815             Davonna Belling, MD 05/16/21 1820

## 2021-05-16 NOTE — ED Triage Notes (Signed)
Pt to the ED with c/o back pain and states she has meningitis and MRSA.

## 2021-05-16 NOTE — Discharge Instructions (Addendum)
Follow-up with your doctors as needed.

## 2021-05-17 ENCOUNTER — Ambulatory Visit (HOSPITAL_COMMUNITY)
Admission: RE | Admit: 2021-05-17 | Discharge: 2021-05-17 | Disposition: A | Discharge status: Home / Self Care | Payer: Self-pay | Source: Ambulatory Visit | Attending: Cardiology | Admitting: Cardiology

## 2021-05-17 DIAGNOSIS — I422 Other hypertrophic cardiomyopathy: Secondary | ICD-10-CM | POA: Insufficient documentation

## 2021-05-17 MED ORDER — GADOBUTROL 1 MMOL/ML IV SOLN
10.0000 mL | Freq: Once | INTRAVENOUS | Status: AC | PRN
  Administered 2021-05-17: 10 mL via INTRAVENOUS

## 2021-05-18 ENCOUNTER — Telehealth: Payer: Self-pay

## 2021-05-18 ENCOUNTER — Other Ambulatory Visit (HOSPITAL_COMMUNITY)
Admission: RE | Admit: 2021-05-18 | Discharge: 2021-05-18 | Disposition: A | Discharge status: Home / Self Care | Payer: Medicaid Other | Source: Ambulatory Visit | Attending: Nephrology | Admitting: Nephrology

## 2021-05-18 ENCOUNTER — Telehealth: Payer: Self-pay | Admitting: *Deleted

## 2021-05-18 DIAGNOSIS — E871 Hypo-osmolality and hyponatremia: Secondary | ICD-10-CM | POA: Insufficient documentation

## 2021-05-18 DIAGNOSIS — R808 Other proteinuria: Secondary | ICD-10-CM | POA: Insufficient documentation

## 2021-05-18 DIAGNOSIS — N189 Chronic kidney disease, unspecified: Secondary | ICD-10-CM | POA: Insufficient documentation

## 2021-05-18 DIAGNOSIS — E559 Vitamin D deficiency, unspecified: Secondary | ICD-10-CM | POA: Insufficient documentation

## 2021-05-18 DIAGNOSIS — E1122 Type 2 diabetes mellitus with diabetic chronic kidney disease: Secondary | ICD-10-CM | POA: Insufficient documentation

## 2021-05-18 DIAGNOSIS — R809 Proteinuria, unspecified: Secondary | ICD-10-CM | POA: Insufficient documentation

## 2021-05-18 DIAGNOSIS — I129 Hypertensive chronic kidney disease with stage 1 through stage 4 chronic kidney disease, or unspecified chronic kidney disease: Secondary | ICD-10-CM | POA: Insufficient documentation

## 2021-05-18 DIAGNOSIS — E1129 Type 2 diabetes mellitus with other diabetic kidney complication: Secondary | ICD-10-CM | POA: Insufficient documentation

## 2021-05-18 LAB — RENAL FUNCTION PANEL
Albumin: 2.3 g/dL — ABNORMAL LOW (ref 3.5–5.0)
Anion gap: 6 (ref 5–15)
BUN: 13 mg/dL (ref 6–20)
CO2: 29 mmol/L (ref 22–32)
Calcium: 8.5 mg/dL — ABNORMAL LOW (ref 8.9–10.3)
Chloride: 100 mmol/L (ref 98–111)
Creatinine, Ser: 1.08 mg/dL — ABNORMAL HIGH (ref 0.44–1.00)
GFR, Estimated: >60 mL/min (ref >60)
Glucose, Bld: 573 mg/dL (ref 70–99)
Phosphorus: 4.4 mg/dL (ref 2.5–4.6)
Potassium: 4.1 mmol/L (ref 3.5–5.1)
Sodium: 135 mmol/L (ref 135–145)

## 2021-05-18 NOTE — Telephone Encounter (Signed)
-----   Message from Erma Heritage, [place] sent at 05/17/2021  6:54 PM EDT ----- Covering for Dr. Domenic Polite - Please let the patient know her Cardiac MRI showed normal pumping function of the heart with no evidence of hypertrophic cardiomyopathy or infiltrative disease which is reassuring. She did have mild thickness of the heart muscle which is common to see with HTN and good blood pressure control is essential. Please forward a copy of results to [name], PA-C.

## 2021-05-18 NOTE — Telephone Encounter (Signed)
Joy & [name]- I called pt, her blood sugar was 382. Please advise.

## 2021-05-18 NOTE — Telephone Encounter (Signed)
Patient informed. Copy sent to PCP °

## 2021-05-18 NOTE — Telephone Encounter (Signed)
New message     Patient returning call to [facility] for results

## 2021-05-18 NOTE — Telephone Encounter (Signed)
The [facility] clinic called and said that Dr Tinnie Gens office left them a VM that she had a critical lab result for her fasting blood sugar this morning. It was 573. They want you to follow up with the pt.

## 2021-05-24 ENCOUNTER — Telehealth: Payer: Self-pay

## 2021-05-24 NOTE — Telephone Encounter (Signed)
Called client to inform her that [facility] had been arranged for 05/29/21 and that she should receive a call on Friday to confirm. Appointment is with Infectious Disease.   [place] [employer]

## 2021-05-24 NOTE — Telephone Encounter (Signed)
[place] with [facility] regarding client needing transportation for 05/29/21 to Infectious disease at 0945 am. [facility] and transport arranged. Plan to call client to notify and to follow up.  [place] [employer]

## 2021-05-29 ENCOUNTER — Other Ambulatory Visit: Payer: Self-pay

## 2021-05-29 ENCOUNTER — Encounter: Payer: Self-pay | Admitting: Internal Medicine

## 2021-05-29 ENCOUNTER — Ambulatory Visit (INDEPENDENT_AMBULATORY_CARE_PROVIDER_SITE_OTHER): Payer: Self-pay | Admitting: Internal Medicine

## 2021-05-29 ENCOUNTER — Telehealth: Payer: Self-pay | Admitting: Physician Assistant

## 2021-05-29 ENCOUNTER — Telehealth: Payer: Self-pay

## 2021-05-29 VITALS — BP 130/92 | HR 107 | Resp 16 | Ht 59.0 in | Wt 129.6 lb

## 2021-05-29 DIAGNOSIS — R7881 Bacteremia: Secondary | ICD-10-CM

## 2021-05-29 DIAGNOSIS — E1065 Type 1 diabetes mellitus with hyperglycemia: Secondary | ICD-10-CM

## 2021-05-29 DIAGNOSIS — B9561 Methicillin susceptible Staphylococcus aureus infection as the cause of diseases classified elsewhere: Secondary | ICD-10-CM

## 2021-05-29 DIAGNOSIS — M4646 Discitis, unspecified, lumbar region: Secondary | ICD-10-CM

## 2021-05-29 HISTORY — DX: Discitis, unspecified, lumbar region: M46.46

## 2021-05-29 NOTE — Patient Instructions (Signed)
Thank you for coming to see me today. It was a pleasure seeing you.  To Do: Labs today Follow up with Dr Tommy Medal in about 4-6 weeks.  If you develop fevers, chills, or significantly worse back pain please call sooner.  If you have any questions or concerns, please do not hesitate to call the office at [phone].

## 2021-05-29 NOTE — Telephone Encounter (Signed)
Client notified this RN of [facility] regarding need for transportation 07/03/21 at 2:45 PM to see center for Infectious disease Dr. Tommy Medal.  [facility] called and arranged for above date and time. Client notified transportation has been arranged.  Called placed also to [place] paramedic with [facility] for update.   [place] [employer]

## 2021-05-29 NOTE — Telephone Encounter (Signed)
Patient had came in this morning 05/29/21 to see Dr. Juleen China and had said she would established with a pain clinic soon through her PCP but was told by her PCP that ID provider will need to send a referral or have in the records that she needs to see a pain doctor of back pain. Patient is a Dr. Tommy Medal patient, Best contact number is [phone] if you have any questions.

## 2021-05-29 NOTE — Progress Notes (Addendum)
[name] for Infectious Disease  CHIEF COMPLAINT:    Follow up for MSSA bacteremia and back pain  SUBJECTIVE:    Diamond [name] is a 25 y.o. female with PMHx as below who presents to the clinic for follow-up of MSSA bacteremia and back pain.   She is typically followed in our clinic by Dr. Tommy Medal.  She is a 25 year old woman with a history of diabetes who incidentally had severe low back pain and was found to have MSSA bacteremia and multifocal pneumonia.  TEE was without evidence of endocarditis and imaging of the spine did not show evidence of epidural infection.  Due to her back pain it was decided to treat her for presumptive discitis with cefazolin.  She tolerated this for a period of time but had to stop this due to financial constraints and issues with her PICC line.  She completed her course of therapy with oral cephalexin instead  which was completed at her most recent follow-up with Dr. Tommy Medal on May 12, 2021.  Labs were checked that day which showed normalization of her CRP and overall improvement of her ESR.  Due to inflammatory markers remaining high, however, she was asked to follow-up in 3 to 4 weeks which she presents today to do.  In the interim she was seen at [facility] emergency department for back pain.  She was given a short course of pain medication at her request.  No new imaging was obtained as her pain was not different than previous.  Renal function panel at that time was unremarkable with the exception of a glucose of 573.  Today, she continues to complain of back pain that is relatively unchanged from prior but remains quite severe for her.  She is hoping to get established with a pain clinic soon through her PCP.  She has no fevers, chills, GI sx, or respiratory sx.  No significant weakness but does endorse occasional pain radiating to her left leg.  Please see A&P for the details of today's visit and status of the patient's medical problems.    Patient's Medications  New Prescriptions   No medications on file  Previous Medications   ACETAMINOPHEN (TYLENOL) 500 MG TABLET    Take by mouth.   ALBUTEROL (VENTOLIN HFA) 108 (90 BASE) MCG/ACT INHALER    Inhale 2 puffs into the lungs every 6 (six) hours as needed for wheezing or shortness of breath. For shortness of breath   FUROSEMIDE (LASIX) 20 MG TABLET    Take 20 mg by mouth daily.   GABAPENTIN (NEURONTIN) 100 MG CAPSULE    Take 1 capsule (100 mg total) by mouth 2 (two) times daily.   HYDRALAZINE (APRESOLINE) 25 MG TABLET    Take 1 tablet (25 mg total) by mouth 2 (two) times daily.   HYDROCHLOROTHIAZIDE (HYDRODIURIL) 25 MG TABLET    Take 25 mg by mouth daily.   INSULIN GLARGINE (LANTUS) 100 UNIT/ML INJECTION    Inject 40 Units into the skin at bedtime.   INSULIN GLULISINE (APIDRA [place])    Inject 10-16 Units into the skin 3 (three) times daily before meals. ssi   ISOSORBIDE MONONITRATE (IMDUR) 30 MG 24 HR TABLET    Take 0.5 tablets (15 mg total) by mouth daily.   LOPERAMIDE (IMODIUM A-D) 2 MG TABLET    Take 1 tablet (2 mg total) by mouth 4 (four) times daily as needed for diarrhea or loose stools.   METOCLOPRAMIDE (  REGLAN) 5 MG TABLET    Take 1 tablet (5 mg total) by mouth every 8 (eight) hours as needed for refractory nausea / vomiting (refractory symptoms).   METOPROLOL TARTRATE (LOPRESSOR) 50 MG TABLET    Take 1 tablet (50 mg total) by mouth 2 (two) times daily.   OMEPRAZOLE (PRILOSEC) 40 MG CAPSULE    Take 1 capsule (40 mg total) by mouth in the morning and at bedtime.   ONDANSETRON (ZOFRAN ODT) 8 MG DISINTEGRATING TABLET    Take 1 tablet (8 mg total) by mouth every 8 (eight) hours as needed for nausea or vomiting.   POTASSIUM CHLORIDE (KLOR-CON) 10 MEQ TABLET    Take 10 mEq by mouth daily.   PROMETHAZINE (PHENERGAN) 25 MG TABLET    Take 1 tablet (25 mg total) by mouth every 8 (eight) hours as needed for nausea or vomiting.  Modified Medications   No medications on file  Discontinued  Medications   CEPHALEXIN (KEFLEX) 500 MG CAPSULE    TAKE 1 CAPSULE BY MOUTH 4 TIMES DAILY   OXYCODONE (OXY IR/ROXICODONE) 5 MG IMMEDIATE RELEASE TABLET    Take 1 tablet (5 mg total) by mouth every 4 (four) hours as needed for moderate pain, severe pain or breakthrough pain.   OXYCODONE-ACETAMINOPHEN (PERCOCET/ROXICET) 5-325 MG TABLET    Take 1 tablet by mouth every 8 (eight) hours as needed for severe pain.      Past Medical History:  Diagnosis Date   ADHD (attention deficit hyperactivity disorder) 2007   Allergy    Arm DVT (deep venous thromboembolism), acute, right ([place]) 04/07/2021   Asthma    Bipolar disorder ([place])    Crohn's colitis ([place])    Dx Dr. Cherrie Gauze   Diabetic nephropathy [facility])    Discitis of lumbar region 05/29/2021   Edema    Eosinophilic esophagitis    GERD (gastroesophageal reflux disease)    Hypertension    MSSA bacteremia 04/07/2021   Nephrotic syndrome    Pancreatitis    Panic attacks    PTSD (post-traumatic stress disorder)    Stroke ([place]) 07/2020   R side   Type 1 diabetes ([place]) 2008    Social History   Tobacco Use   Smoking status: Never   Smokeless tobacco: Never  Vaping Use   Vaping Use: Never used  Substance Use Topics   Alcohol use: No   Drug use: No    Family History  Problem Relation Age of Onset   Hypertension Mother    Diabetes Mother    Heart disease Mother    Crohn's disease Father    Alcohol abuse Father    Drug abuse Father    Mental illness Father    Cancer Father    Hypertension Maternal Aunt    Seizures Maternal Aunt    Depression Maternal Aunt    Asthma Maternal Uncle    Hypertension Maternal Uncle    Cancer Maternal Grandmother    Diabetes Maternal Grandmother    Mental illness Maternal Grandfather     Allergies  Allergen Reactions   Amoxicillin Hives    Did it involve swelling of the face/tongue/throat, SOB, or low BP? no  Did it involve sudden or severe rash/hives, skin peeling, or any reaction on the  inside of your mouth or nose? no Did you need to seek medical attention at a hospital or doctor's office? no When did it last happen?   unk    If all above answers are "NO", may proceed  with cephalosporin use.   Dexamethasone Hives and Itching   Doxycycline Itching and Swelling   Penicillins Hives and Itching    Did it involve swelling of the face/tongue/throat, SOB, or low BP? no  Did it involve sudden or severe rash/hives, skin peeling, or any reaction on the inside of your mouth or nose? no Did you need to seek medical attention at a hospital or doctor's office? no When did it last happen?   unk    If all above answers are "NO", may proceed with cephalosporin use. Did it involve swelling of the face/tongue/throat, SOB, or low BP? no  Did it involve sudden or severe rash/hives, skin peeling, or any reac   Ceftriaxone Sodium In Dextrose Itching   Augmentin [Amoxicillin-Pot Clavulanate] Other (See Comments)    Reaction unknown   Clavulanic Acid    Adhesive [Tape] Itching and Rash   Latex Itching and Rash   Other Itching and Rash   Pineapple Rash    Rash on tongue and throat    Review of Systems  Constitutional:  Negative for chills and fever.  Respiratory: Negative.    Cardiovascular:  Positive for leg swelling.  Gastrointestinal: Negative.   Genitourinary: Negative.   Musculoskeletal:  Positive for back pain.  Neurological:  Negative for focal weakness.  All other systems reviewed and are negative.   OBJECTIVE:    Vitals:   05/29/21 1012  BP: (!) 130/92  Pulse: (!) 107  Resp: 16  SpO2: 100%  Weight: 129 lb 9.6 oz (58.8 kg)  Height: 4' 11"  (1.499 m)   Body mass index is 26.18 kg/m.  Physical Exam Constitutional:      General: She is not in acute distress.    Appearance: Normal appearance.  HENT:     Head: Normocephalic and atraumatic.  Eyes:     Extraocular Movements: Extraocular movements intact.     Conjunctiva/sclera: Conjunctivae normal.  Pulmonary:      Effort: Pulmonary effort is normal. No respiratory distress.  Musculoskeletal:        General: Tenderness present.     Right lower leg: Edema present.     Left lower leg: Edema present.     Comments: Trace pedal edema.  Tenderness to palpation most noticeable in paraspinal muscles of her low to mid back on the left.  Not much tenderness over her spine.   Neurological:     General: No focal deficit present.     Mental Status: She is alert and oriented to person, place, and time.  Psychiatric:        Mood and Affect: Mood normal.        Behavior: Behavior normal.     Labs and Microbiology: CBC Latest Ref Rng & Units 04/07/2021 04/06/2021 04/05/2021  WBC 4.0 - 10.5 K/uL 6.2 5.8 6.0  Hemoglobin 12.0 - 15.0 g/dL 10.3(L) 10.9(L) 10.5(L)  Hematocrit 36.0 - 46.0 % 30.4(L) 32.2(L) 33.0(L)  Platelets 150 - 400 K/uL 423(H) 374 350   CMP Latest Ref Rng & Units 05/18/2021 04/07/2021 04/06/2021  Glucose 70 - 99 mg/dL 573(HH) 546(HH) 453(H)  BUN 6 - 20 mg/dL 13 8 12   Creatinine 0.44 - 1.00 mg/dL 1.08(H) 1.24(H) 0.98  Sodium 135 - 145 mmol/L 135 132(L) 133(L)  Potassium 3.5 - 5.1 mmol/L 4.1 3.7 3.8  Chloride 98 - 111 mmol/L 100 95(L) 99  CO2 22 - 32 mmol/L 29 27 28   Calcium 8.9 - 10.3 mg/dL 8.5(L) 7.9(L) 8.3(L)  Total Protein 6.5 - 8.1 g/dL -  6.2(L) 6.2(L)  Total Bilirubin 0.3 - 1.2 mg/dL - 0.6 0.7  Alkaline Phos 38 - 126 U/L - 94 101  AST 15 - 41 U/L - 14(L) 12(L)  ALT 0 - 44 U/L - 9 11     No results found for this or any previous visit (from the past 240 hour(s)).    ASSESSMENT & PLAN:    1. MSSA bacteremia 2. Discitis of lumbar region Her back pain is fairly stable and she has remained off antibiotics for the past 2-1/2 weeks since seeing Dr. Tommy Medal.  We will repeat her inflammatory markers today and have her follow-up with him in approximately 4 to 6 weeks.  Hopefully she can establish with pain management soon.  If inflammatory markers remain significantly high or increased and back pain  persists further could consider repeat imaging but will hold off for now given her stability.   Addendum 9:28 AM 05/30/21: Patient's inflammatory markers obtained at time of her visit continue to show improvement while off antibiotics.  Will continue to monitor off antibiotics as there does not appear to be any evidence of recurrent infection at this time.  Recommended that she attempt to establish with pain management for control of her back pain and referral has been entered by her PCP to address this.  3. Uncontrolled type 1 diabetes mellitus with hyperglycemia and Diabetic Nephropathy Her diabetes remains uncontrolled with recent glucose over 500 at the ED.    [facility] for Infectious Disease  [facility] 05/29/2021, 10:30 AM  I spent 30 minutes dedicated to the care of this patient on the date of this encounter to include pre-visit review of records, face-to-face time with the patient discussing bacteremia, discitis, DM, and post-visit ordering of testing.

## 2021-05-29 NOTE — Telephone Encounter (Signed)
Returned pt voicemail.  She said she wanted referral to pain management because dr Drucilla Schmidt said she needed that.  Pt was told that her records would be reviewed and she would be called in reference to this tomorrow.  Pt stated understanding.

## 2021-05-30 ENCOUNTER — Telehealth: Payer: Self-pay

## 2021-05-30 ENCOUNTER — Telehealth: Payer: Self-pay | Admitting: Physician Assistant

## 2021-05-30 DIAGNOSIS — M4646 Discitis, unspecified, lumbar region: Secondary | ICD-10-CM

## 2021-05-30 DIAGNOSIS — M545 Low back pain, unspecified: Secondary | ICD-10-CM

## 2021-05-30 LAB — C-REACTIVE PROTEIN: CRP: 0.4 mg/L (ref <8.0)

## 2021-05-30 LAB — SEDIMENTATION RATE: Sed Rate: 46 mm/h — ABNORMAL HIGH (ref 0–20)

## 2021-05-30 NOTE — Telephone Encounter (Signed)
Pt was called and told that referral to pain clinic would be entered in reference her back pain and she should be getting a call from that office to schedule appointment.

## 2021-05-30 NOTE — Telephone Encounter (Signed)
-----   Message from Mignon Pine, DO sent at 05/30/2021  9:32 AM EDT ----- Please let pt know that her inflammatory markers are improved and will continue to monitor her off antibiotics.  She has follow up with Dr Tommy Medal and she should keep this appointment.  Her PCP has entered a referral for pain management to help with her pain.

## 2021-05-30 NOTE — Telephone Encounter (Signed)
Spoke with patient, advised her that her inflammatory markers have improved and that the doctor will continue to monitor her off of antibiotics. Reminded her of follow-up appointment with Dr. Tommy Medal and notified her that her PCP has placed a pain management referral. Patient verbalized understanding and has no further questions.   Beryle Flock, RN

## 2021-06-12 ENCOUNTER — Ambulatory Visit: Payer: Medicaid Other | Admitting: Infectious Disease

## 2021-06-14 ENCOUNTER — Ambulatory Visit: Payer: Medicaid Other | Admitting: Allergy & Immunology

## 2021-06-15 ENCOUNTER — Telehealth: Payer: Self-pay

## 2021-06-15 NOTE — Telephone Encounter (Signed)
Called to follow up with client. She reports she is starting a new job at [employer]. Discussed that she is not going to follow through with disability appointment next week. She has not spoken to integrated care. She reports bedtime glucose was 70 last night and 120s today. Reports her swelling has gone down. Client assured she can work and was told she could take breaks to put her feet up. Client reports she lost her computer by not having funds to pay for it and she was trying to do "school" work on that computer.  Other current needs stressed was for a black shirt and non skid shoes for work by [employer]" referred client to [employer] in [place] for Ambulance person or to the [employer] store in [place]. Client was currently at the [facility] but states she doesn't have gas to get to get clothing vouchers etc. Per client's request sent text of phone and address to [employer].  [place] [employer]

## 2021-06-15 NOTE — Telephone Encounter (Signed)
Contacted client via text to follow up and arrange a phone call time for 06/15/21. Client states she is "ok' and that 06/15/21 would be a good time for a call at 3:00PM. Plan call client 06/15/21 at [facility] for follow up.  [place] [employer]

## 2021-06-19 ENCOUNTER — Telehealth: Payer: Self-pay

## 2021-06-19 NOTE — Telephone Encounter (Signed)
Follow up with Joyce paramedic with [employer] regarding client.  Client has determined she will not attend the mental health screening for disability. She has recently acquired new employment with [employer] out [place].  Will continue to follow for [facility] services.  [place] [employer]

## 2021-06-22 ENCOUNTER — Other Ambulatory Visit: Payer: Medicaid Other | Admitting: Women's Health

## 2021-07-03 ENCOUNTER — Ambulatory Visit (INDEPENDENT_AMBULATORY_CARE_PROVIDER_SITE_OTHER): Payer: Self-pay | Admitting: Infectious Disease

## 2021-07-03 ENCOUNTER — Telehealth: Payer: Self-pay

## 2021-07-03 ENCOUNTER — Other Ambulatory Visit: Payer: Self-pay

## 2021-07-03 VITALS — BP 141/94 | HR 105 | Temp 98.8°F | Wt 118.0 lb

## 2021-07-03 DIAGNOSIS — L089 Local infection of the skin and subcutaneous tissue, unspecified: Secondary | ICD-10-CM

## 2021-07-03 DIAGNOSIS — A4901 Methicillin susceptible Staphylococcus aureus infection, unspecified site: Secondary | ICD-10-CM

## 2021-07-03 DIAGNOSIS — F39 Unspecified mood [affective] disorder: Secondary | ICD-10-CM

## 2021-07-03 DIAGNOSIS — M549 Dorsalgia, unspecified: Secondary | ICD-10-CM

## 2021-07-03 DIAGNOSIS — M4646 Discitis, unspecified, lumbar region: Secondary | ICD-10-CM

## 2021-07-03 DIAGNOSIS — K5090 Crohn's disease, unspecified, without complications: Secondary | ICD-10-CM

## 2021-07-03 DIAGNOSIS — A419 Sepsis, unspecified organism: Secondary | ICD-10-CM

## 2021-07-03 DIAGNOSIS — E1121 Type 2 diabetes mellitus with diabetic nephropathy: Secondary | ICD-10-CM

## 2021-07-03 NOTE — Telephone Encounter (Signed)
Pt calling to request letter for work with restrictions, informed per [name] that she does not think she should work per letter sent April 2022 & informed for work restrictions excuse she would need to consult with OT. Pt understood.

## 2021-07-03 NOTE — Telephone Encounter (Signed)
Client had texted this RN on work phone over weekend. She had missed call from [place] confirming transportation to Infectious Disease on 07/03/21 which had been arranged. Early am received confirmation that client is still scheduled for transportation.  Notified client by text message this morning that [facility] transportation confirmed she does have transportation for today's appointment.   [place] [employer]

## 2021-07-03 NOTE — Progress Notes (Signed)
Subjective:  Chief complaint: severe back pain with spasms yesterday    Patient ID: Diamond [name], female    DOB: 04/26/1996, 25 y.o.   MRN: [id number]  HPI  25  year-old black woman living with diabetes mellitus who had severe low back pain and was found to have MSSA bacteremia and multifocal pneumonia.  Transesophageal echocardiogram failed to show evidence of endocarditis and imaging of the spine failed to show evidence of discitis.  We decided to err on the side of treating her for presumptive discitis with cefazolin.  She received the cefazolin but not for the entire duration we had desired because apparently charity care would no longer pay for medications.  In the interim she was having trouble with her PICC line not flushing.  She came to the ER o have this evaluated but then left before any substantive evaluation could be done.  She presented to clinic several visits ago without an appointment but we saw her as a work in.  On exam she clearly has a tender painful area proximal to her PICC line which is very concerning for a DVT.  She also has been complaining of intermittent burning chest pain as well.  I directed her to go to the emergency department so this can be evaluated and that she can be started on blood thinners as I am certain she has a deep venous thrombosis.  She ended up leaving once again but eventually did come back to the ER and the PICC line was removed at my recommendation she was placed on oral Keflex which she has continued through this date.  She has a couple of pills left.  She still has some back pain though it was much improved compared to when she was in the hospital when I saw her in June.  We took her off oral antibiotics at the beginning of June  but asked her to return to clinic due to ESR being high still. She saw my partner Dr.Wallace on June 27th 2022.   She was continuing to complain of low back pain Repeat ESR and CRP showed ESR coming down further  and CRP still normal  She has continued off antibiotics since then  Pain is similar to last visit with me though was very bad yeserday.  Much of this SEEMS more like MSK spasm rather than pain from vertebral osteo or diskitis.      Past Medical History:  Diagnosis Date   ADHD (attention deficit hyperactivity disorder) 2007   Allergy    Arm DVT (deep venous thromboembolism), acute, right ([place]) 04/07/2021   Asthma    Bipolar disorder ([place])    Crohn's colitis ([place])    Dx Dr. Cherrie Gauze   Diabetic nephropathy [facility])    Discitis of lumbar region 05/29/2021   Edema    Eosinophilic esophagitis    GERD (gastroesophageal reflux disease)    Hypertension    MSSA bacteremia 04/07/2021   Nephrotic syndrome    Pancreatitis    Panic attacks    PTSD (post-traumatic stress disorder)    Stroke ([place]) 07/2020   R side   Type 1 diabetes ([place]) 2008    Past Surgical History:  Procedure Laterality Date   ADENOIDECTOMY     BIOPSY  02/01/2021   Procedure: BIOPSY;  Surgeon: Rogene Houston, MD;  [facility];  Service: Endoscopy;;  duodenal, esophageal,   BIOPSY  02/03/2021   Procedure: BIOPSY;  Surgeon: Eloise Harman, DO;  [facility]  [facility];  Service: Endoscopy;;   COLONOSCOPY WITH PROPOFOL N/A 02/01/2021   Procedure: COLONOSCOPY WITH PROPOFOL;  Surgeon: Rogene Houston, MD;  [facility];  Service: Endoscopy;  Laterality: N/A;   COLONOSCOPY WITH PROPOFOL N/A 02/03/2021   Procedure: COLONOSCOPY WITH PROPOFOL;  Surgeon: Eloise Harman, DO;  [facility];  Service: Endoscopy;  Laterality: N/A;   ESOPHAGOGASTRODUODENOSCOPY (EGD) WITH PROPOFOL N/A 02/01/2021   Procedure: ESOPHAGOGASTRODUODENOSCOPY (EGD) WITH PROPOFOL;  Surgeon: Rogene Houston, MD;  [facility];  Service: Endoscopy;  Laterality: N/A;   HERNIA REPAIR     TEE WITHOUT CARDIOVERSION N/A 03/09/2021   Procedure: TRANSESOPHAGEAL ECHOCARDIOGRAM (TEE) WITH PROPOFOL;  Surgeon: Arnoldo Lenis, MD;  [facility];  Service: Endoscopy;  Laterality: N/A;   TONSILLECTOMY      Family History  Problem Relation Age of Onset   Hypertension Mother    Diabetes Mother    Heart disease Mother    Crohn's disease Father    Alcohol abuse Father    Drug abuse Father    Mental illness Father    Cancer Father    Hypertension Maternal Aunt    Seizures Maternal Aunt    Depression Maternal Aunt    Asthma Maternal Uncle    Hypertension Maternal Uncle    Cancer Maternal Grandmother    Diabetes Maternal Grandmother    Mental illness Maternal Grandfather       Social History   Socioeconomic History   Marital status: Single    Spouse name: Not on file   Number of children: Not on file   Years of education: Not on file   Highest education level: Not on file  Occupational History   Not on file  Tobacco Use   Smoking status: Never   Smokeless tobacco: Never  Vaping Use   Vaping Use: Never used  Substance and Sexual Activity   Alcohol use: No   Drug use: No   Sexual activity: Yes  Other Topics Concern   Not on file  Social History Narrative   Not on file   Social Determinants of Health   Financial Resource Strain: Not on file  Food Insecurity: Not on file  Transportation Needs: Not on file  Physical Activity: Not on file  Stress: Not on file  Social Connections: Not on file    Allergies  Allergen Reactions   Amoxicillin Hives    Did it involve swelling of the face/tongue/throat, SOB, or low BP? no  Did it involve sudden or severe rash/hives, skin peeling, or any reaction on the inside of your mouth or nose? no Did you need to seek medical attention at a hospital or doctor's office? no When did it last happen?   unk    If all above answers are "NO", may proceed with cephalosporin use.   Dexamethasone Hives and Itching   Doxycycline Itching and Swelling   Penicillins Hives and Itching    Did it involve swelling of the face/tongue/throat, SOB, or  low BP? no  Did it involve sudden or severe rash/hives, skin peeling, or any reaction on the inside of your mouth or nose? no Did you need to seek medical attention at a hospital or doctor's office? no When did it last happen?   unk    If all above answers are "NO", may proceed with cephalosporin use. Did it involve swelling of the face/tongue/throat, SOB, or low BP? no  Did it involve sudden or  severe rash/hives, skin peeling, or any reac   Ceftriaxone Sodium In Dextrose Itching   Augmentin [Amoxicillin-Pot Clavulanate] Other (See Comments)    Reaction unknown   Clavulanic Acid    Adhesive [Tape] Itching and Rash   Latex Itching and Rash   Other Itching and Rash   Pineapple Rash    Rash on tongue and throat     Current Outpatient Medications:    acetaminophen (TYLENOL) 500 MG tablet, Take by mouth., Disp: , Rfl:    albuterol (VENTOLIN HFA) 108 (90 Base) MCG/ACT inhaler, Inhale 2 puffs into the lungs every 6 (six) hours as needed for wheezing or shortness of breath. For shortness of breath, Disp: 3 each, Rfl: 0   furosemide (LASIX) 20 MG tablet, Take 20 mg by mouth daily., Disp: , Rfl:    gabapentin (NEURONTIN) 100 MG capsule, Take 1 capsule (100 mg total) by mouth 2 (two) times daily., Disp: 180 capsule, Rfl: 5   hydrALAZINE (APRESOLINE) 25 MG tablet, Take 1 tablet (25 mg total) by mouth 2 (two) times daily., Disp: 90 tablet, Rfl: 3   hydrochlorothiazide (HYDRODIURIL) 25 MG tablet, Take 25 mg by mouth daily., Disp: , Rfl:    insulin glargine (LANTUS) 100 UNIT/ML injection, Inject 40 Units into the skin at bedtime., Disp: , Rfl:    Insulin Glulisine (APIDRA [place]), Inject 10-16 Units into the skin 3 (three) times daily before meals. ssi, Disp: , Rfl:    isosorbide mononitrate (IMDUR) 30 MG 24 hr tablet, Take 0.5 tablets (15 mg total) by mouth daily. (Patient taking differently: Take 30 mg by mouth daily.), Disp: , Rfl:    loperamide (IMODIUM A-D) 2 MG tablet, Take 1 tablet (2 mg total) by  mouth 4 (four) times daily as needed for diarrhea or loose stools., Disp: 30 tablet, Rfl: 0   metoCLOPramide (REGLAN) 5 MG tablet, Take 1 tablet (5 mg total) by mouth every 8 (eight) hours as needed for refractory nausea / vomiting (refractory symptoms)., Disp: 90 tablet, Rfl: 0   metoprolol tartrate (LOPRESSOR) 50 MG tablet, Take 1 tablet (50 mg total) by mouth 2 (two) times daily., Disp: 180 tablet, Rfl: 0   omeprazole (PRILOSEC) 40 MG capsule, Take 1 capsule (40 mg total) by mouth in the morning and at bedtime., Disp: 18 capsule, Rfl: 0   ondansetron (ZOFRAN ODT) 8 MG disintegrating tablet, Take 1 tablet (8 mg total) by mouth every 8 (eight) hours as needed for nausea or vomiting., Disp: 20 tablet, Rfl: 2   potassium chloride (KLOR-CON) 10 MEQ tablet, Take 10 mEq by mouth daily., Disp: , Rfl:    promethazine (PHENERGAN) 25 MG tablet, Take 1 tablet (25 mg total) by mouth every 8 (eight) hours as needed for nausea or vomiting., Disp: 20 tablet, Rfl: 0   Review of Systems  Constitutional:  Negative for activity change, appetite change, chills, diaphoresis, fatigue, fever and unexpected weight change.  HENT:  Negative for congestion, hearing loss, rhinorrhea, sinus pressure, sneezing, sore throat, tinnitus and trouble swallowing.   Eyes:  Negative for photophobia and visual disturbance.  Respiratory:  Negative for cough, chest tightness, shortness of breath, wheezing and stridor.   Cardiovascular:  Negative for chest pain, palpitations and leg swelling.  Gastrointestinal:  Negative for abdominal distention, abdominal pain, anal bleeding, blood in stool, constipation, diarrhea, nausea and vomiting.  Genitourinary:  Negative for difficulty urinating, dysuria, flank pain and hematuria.  Musculoskeletal:  Positive for back pain. Negative for arthralgias, gait problem, joint swelling and myalgias.  Skin:  Negative for color change, pallor, rash and wound.  Neurological:  Negative for dizziness, tremors,  weakness, light-headedness and headaches.  Hematological:  Negative for adenopathy. Does not bruise/bleed easily.  Psychiatric/Behavioral:  Negative for agitation, behavioral problems, confusion, decreased concentration, dysphoric mood, sleep disturbance and suicidal ideas. The patient is not nervous/anxious.       Objective:   Physical Exam Constitutional:      General: She is not in acute distress.    Appearance: Normal appearance. She is well-developed. She is not ill-appearing or diaphoretic.  HENT:     Head: Normocephalic and atraumatic.     Right Ear: Hearing and external ear normal.     Left Ear: Hearing and external ear normal.     Nose: No nasal deformity or rhinorrhea.  Eyes:     General: No scleral icterus.    Conjunctiva/sclera: Conjunctivae normal.     Right eye: Right conjunctiva is not injected.     Left eye: Left conjunctiva is not injected.     Pupils: Pupils are equal, round, and reactive to light.  Neck:     Vascular: No JVD.  Cardiovascular:     Rate and Rhythm: Normal rate and regular rhythm.     Heart sounds: S1 normal and S2 normal.  Pulmonary:     Effort: Pulmonary effort is normal. No respiratory distress.     Breath sounds: No wheezing.  Abdominal:     General: Bowel sounds are normal. There is no distension.     Palpations: Abdomen is soft.  Musculoskeletal:        General: Normal range of motion.     Right shoulder: Normal.     Left shoulder: Normal.     Cervical back: Normal range of motion and neck supple. No swelling or edema.     Thoracic back: No swelling or edema.     Lumbar back: Spasms and tenderness present. No swelling or edema.     Right hip: Normal.     Left hip: Normal.     Right knee: Normal.     Left knee: Normal.  Lymphadenopathy:     Head:     Right side of head: No submandibular, preauricular or posterior auricular adenopathy.     Left side of head: No submandibular, preauricular or posterior auricular adenopathy.      Cervical: No cervical adenopathy.     Right cervical: No superficial or deep cervical adenopathy.    Left cervical: No superficial or deep cervical adenopathy.  Skin:    General: Skin is warm and dry.     Coloration: Skin is not pale.     Findings: No abrasion, bruising, ecchymosis, erythema, lesion or rash.     Nails: There is no clubbing.  Neurological:     General: No focal deficit present.     Mental Status: She is alert and oriented to person, place, and time.     Sensory: No sensory deficit.     Coordination: Coordination normal.     Gait: Gait normal.  Psychiatric:        Attention and Perception: Attention normal. She is attentive.        Mood and Affect: Mood is anxious. Mood is not depressed.        Speech: Speech normal.        Behavior: Behavior normal. Behavior is cooperative.        Thought Content: Thought content normal.        Judgment:  Judgment normal.          Assessment & Plan:   Possible diskitis: She has received a quite ample course despite NOT having radiographic evidence of infection deep in spine  I will recheck labs again and if reassuring continue to observe off of antibiotics and if not will get MRI L spine I ordered CRP, ESR, CBC w diff and creatinine in case we need to do MRI  DM: very poorly controllled  Back pain: I am referring to PT in addition to checking labs above. Much of her pain seems to be due to muscle spasms

## 2021-07-04 ENCOUNTER — Telehealth: Payer: Self-pay

## 2021-07-04 ENCOUNTER — Other Ambulatory Visit: Payer: Self-pay | Admitting: Infectious Disease

## 2021-07-04 DIAGNOSIS — M4646 Discitis, unspecified, lumbar region: Secondary | ICD-10-CM

## 2021-07-04 DIAGNOSIS — R7881 Bacteremia: Secondary | ICD-10-CM

## 2021-07-04 LAB — CBC WITH DIFFERENTIAL/PLATELET
Absolute Monocytes: 290 cells/uL (ref 200–950)
Basophils Absolute: 48 cells/uL (ref 0–200)
Basophils Relative: 0.7 %
Eosinophils Absolute: 373 cells/uL (ref 15–500)
Eosinophils Relative: 5.4 %
HCT: 41 % (ref 35.0–45.0)
Hemoglobin: 12.8 g/dL (ref 11.7–15.5)
Lymphs Abs: 2063 cells/uL (ref 850–3900)
MCH: 28.3 pg (ref 27.0–33.0)
MCHC: 31.2 g/dL — ABNORMAL LOW (ref 32.0–36.0)
MCV: 90.7 fL (ref 80.0–100.0)
MPV: 10.8 fL (ref 7.5–12.5)
Monocytes Relative: 4.2 %
Neutro Abs: 4126 cells/uL (ref 1500–7800)
Neutrophils Relative %: 59.8 %
Platelets: 361 10*3/uL (ref 140–400)
RBC: 4.52 10*6/uL (ref 3.80–5.10)
RDW: 12.1 % (ref 11.0–15.0)
Total Lymphocyte: 29.9 %
WBC: 6.9 10*3/uL (ref 3.8–10.8)

## 2021-07-04 LAB — SEDIMENTATION RATE: Sed Rate: 63 mm/h — ABNORMAL HIGH (ref 0–20)

## 2021-07-04 LAB — C-REACTIVE PROTEIN: CRP: 0.4 mg/L (ref <8.0)

## 2021-07-04 NOTE — Telephone Encounter (Signed)
Called patient to relay results, no answer.   Beryle Flock, RN

## 2021-07-04 NOTE — Telephone Encounter (Signed)
Returned call to client.she has multiple appointments upcoming that she needs transportation for. She also states her labs and back were concerning for infectious disease and he would like for her to have an MRI at [place] 07/10/21 at [place] client did state she asked if she could have it at [facility] and was told she could not.  The following transportation was scheduled: 07/10/21 at [place] to [place] for MRI 07/12/21 at [facility] 07/13/21 at [place] [address] Theador Hawthorne with [facility] 07/19/21 at 10 am [place] 08/04/21 11:15 am [place] infectious disease  Client called and notified the above has been arranged. Client is now established in [facility] transportation system and client will be able to schedule her own transportation. Will text client [facility] transportation number with guidelines. Client agreeable.   [place] [employer]

## 2021-07-04 NOTE — Telephone Encounter (Signed)
-----   Message from Truman Hayward, MD sent at 07/04/2021 10:12 AM EDT ----- Inflammatory markers are still up I have ordered MRI of the lumbar spine with and without contrast ----- Message ----- From: [place] [facility] Lab Results In Sent: 07/03/2021  10:56 PM EDT To: Truman Hayward, MD

## 2021-07-04 NOTE — Telephone Encounter (Signed)
Called patient to relay results. Is scheduled for MRI on 8/8. Leatrice Jewels, RMA

## 2021-07-10 ENCOUNTER — Other Ambulatory Visit: Payer: Self-pay

## 2021-07-10 ENCOUNTER — Ambulatory Visit (HOSPITAL_COMMUNITY)
Admission: RE | Admit: 2021-07-10 | Discharge: 2021-07-10 | Disposition: A | Discharge status: Home / Self Care | Payer: Self-pay | Source: Ambulatory Visit | Attending: Infectious Disease | Admitting: Infectious Disease

## 2021-07-10 DIAGNOSIS — M4646 Discitis, unspecified, lumbar region: Secondary | ICD-10-CM | POA: Insufficient documentation

## 2021-07-10 DIAGNOSIS — R7881 Bacteremia: Secondary | ICD-10-CM | POA: Insufficient documentation

## 2021-07-10 MED ORDER — GADOBUTROL 1 MMOL/ML IV SOLN
5.5000 mL | Freq: Once | INTRAVENOUS | Status: AC | PRN
  Administered 2021-07-10: 5.5 mL via INTRAVENOUS

## 2021-07-12 ENCOUNTER — Ambulatory Visit: Payer: Medicaid Other | Admitting: "Endocrinology

## 2021-07-12 ENCOUNTER — Other Ambulatory Visit: Payer: Self-pay

## 2021-07-13 ENCOUNTER — Other Ambulatory Visit (HOSPITAL_COMMUNITY)
Admission: RE | Admit: 2021-07-13 | Discharge: 2021-07-13 | Disposition: A | Discharge status: Home / Self Care | Payer: Medicaid Other | Source: Ambulatory Visit | Attending: Nephrology | Admitting: Nephrology

## 2021-07-13 ENCOUNTER — Telehealth: Payer: Self-pay | Admitting: "Endocrinology

## 2021-07-13 DIAGNOSIS — R809 Proteinuria, unspecified: Secondary | ICD-10-CM | POA: Insufficient documentation

## 2021-07-13 DIAGNOSIS — R808 Other proteinuria: Secondary | ICD-10-CM | POA: Insufficient documentation

## 2021-07-13 DIAGNOSIS — E1129 Type 2 diabetes mellitus with other diabetic kidney complication: Secondary | ICD-10-CM | POA: Insufficient documentation

## 2021-07-13 DIAGNOSIS — E871 Hypo-osmolality and hyponatremia: Secondary | ICD-10-CM | POA: Insufficient documentation

## 2021-07-13 DIAGNOSIS — E1122 Type 2 diabetes mellitus with diabetic chronic kidney disease: Secondary | ICD-10-CM | POA: Insufficient documentation

## 2021-07-13 DIAGNOSIS — N189 Chronic kidney disease, unspecified: Secondary | ICD-10-CM | POA: Insufficient documentation

## 2021-07-13 DIAGNOSIS — E559 Vitamin D deficiency, unspecified: Secondary | ICD-10-CM | POA: Insufficient documentation

## 2021-07-13 DIAGNOSIS — I129 Hypertensive chronic kidney disease with stage 1 through stage 4 chronic kidney disease, or unspecified chronic kidney disease: Secondary | ICD-10-CM | POA: Insufficient documentation

## 2021-07-13 LAB — RENAL FUNCTION PANEL
Albumin: 2.7 g/dL — ABNORMAL LOW (ref 3.5–5.0)
Anion gap: 10 (ref 5–15)
BUN: 17 mg/dL (ref 6–20)
CO2: 26 mmol/L (ref 22–32)
Calcium: 8.8 mg/dL — ABNORMAL LOW (ref 8.9–10.3)
Chloride: 91 mmol/L — ABNORMAL LOW (ref 98–111)
Creatinine, Ser: 1.19 mg/dL — ABNORMAL HIGH (ref 0.44–1.00)
GFR, Estimated: >60 mL/min (ref >60)
Glucose, Bld: 786 mg/dL (ref 70–99)
Phosphorus: 4.8 mg/dL — ABNORMAL HIGH (ref 2.5–4.6)
Potassium: 4.6 mmol/L (ref 3.5–5.1)
Sodium: 127 mmol/L — ABNORMAL LOW (ref 135–145)

## 2021-07-13 LAB — CBC
HCT: 42.2 % (ref 36.0–46.0)
Hemoglobin: 13.9 g/dL (ref 12.0–15.0)
MCH: 29.2 pg (ref 26.0–34.0)
MCHC: 32.9 g/dL (ref 30.0–36.0)
MCV: 88.7 fL (ref 80.0–100.0)
Platelets: 364 10*3/uL (ref 150–400)
RBC: 4.76 MIL/uL (ref 3.87–5.11)
RDW: 12.6 % (ref 11.5–15.5)
WBC: 6.2 10*3/uL (ref 4.0–10.5)
nRBC: 0 % (ref 0.0–0.2)

## 2021-07-13 LAB — PROTEIN / CREATININE RATIO, URINE
Creatinine, Urine: 16.03 mg/dL
Protein Creatinine Ratio: 6.55 mg/mg{Cre} — ABNORMAL HIGH (ref 0.00–0.15)
Total Protein, Urine: 105 mg/dL

## 2021-07-13 NOTE — Telephone Encounter (Signed)
Dr. Theador Hawthorne office called to inform us that the patient seemed "out of it" today and her glucose was 786 and wanted to know if patient could be seen sooner than 8/17. Patient originally had an appointment yesterday 8/10 and she did not bring her meter or logs therefore she was asked to reschedule when she could bring in her readings, patient requested 8/17 because she has to come to [place] to see another provider this day and  per patient she struggles with finding transportation. After speaking with the NP Whitney she advised patient needs to go to the ER ASAP for evaluation. Attempted to contact patient and received no answer.

## 2021-07-13 NOTE — Telephone Encounter (Signed)
Spoke to pt and advised her to go to the ER per Whitney Reardon,FNP. Pt voiced understanding.

## 2021-07-14 LAB — PTH, INTACT AND CALCIUM
Calcium, Total (PTH): 9.2 mg/dL (ref 8.7–10.2)
PTH: 28 pg/mL (ref 15–65)

## 2021-07-14 NOTE — Telephone Encounter (Signed)
Tried to call pt to follow up this morning had to leave a message requesting a return call to the office.

## 2021-07-18 ENCOUNTER — Telehealth: Payer: Self-pay

## 2021-07-18 NOTE — Telephone Encounter (Signed)
Client responded by text to earlier attempt to follow up. Client reports she is having rectal bleeding, headaches, feeling "drained" and throat hurting. Asked client regarding her blood sugars and she states they are probably high. She reports onset 2 days ago. Recommended client follow up with her primary care provider and seek evaluation in Emergency room for bleeding and symptoms. Client reports she is currently at [facility] Asked client to please keep me updated. Client reports that she will.   [place] [employer]

## 2021-07-18 NOTE — Congregational Nurse Program (Signed)
Opened in ERROR See other telephone encounters.

## 2021-07-18 NOTE — Telephone Encounter (Signed)
Texted client to check in with client as unknown her work scheduled. Will await return message. Client prefers texts.  [place] [employer]

## 2021-07-19 ENCOUNTER — Ambulatory Visit: Payer: Medicaid Other | Admitting: "Endocrinology

## 2021-07-19 ENCOUNTER — Telehealth: Payer: Self-pay

## 2021-07-19 ENCOUNTER — Ambulatory Visit: Payer: Medicaid Other | Admitting: Physician Assistant

## 2021-07-19 NOTE — Telephone Encounter (Signed)
Attempted to contact client regarding recent ER visit to [facility]. Left message requesting response.   [place] [employer]

## 2021-07-19 NOTE — Telephone Encounter (Signed)
Pt currently admitted to [facility] for Sugar 900. Canceled her appt today

## 2021-07-25 ENCOUNTER — Ambulatory Visit: Payer: Medicaid Other | Admitting: Physician Assistant

## 2021-07-25 ENCOUNTER — Encounter: Payer: Self-pay | Admitting: Physician Assistant

## 2021-07-25 DIAGNOSIS — E1065 Type 1 diabetes mellitus with hyperglycemia: Secondary | ICD-10-CM

## 2021-07-25 DIAGNOSIS — I10 Essential (primary) hypertension: Secondary | ICD-10-CM

## 2021-07-25 NOTE — Progress Notes (Signed)
There were no vitals taken for this visit.   Subjective:    Patient ID: Diamond [name], female    DOB: [birth year], 25 y.o.   MRN: [id number]  HPI: Diamond [name] is a 25 y.o. female presenting on 07/25/2021 for No chief complaint on file.   HPI  This is a telemedicine appointment through [employer].  Pt was given option to have in-person or virtual appointment and she preferred virtual.  I connected with  Diamond [name] on 07/25/21 by a video enabled telemedicine application and verified that I am speaking with the correct person using two identifiers.   I discussed the limitations of evaluation and management by telemedicine. The patient expressed understanding and agreed to proceed.  Pt is at home.  Provider is at office.    Pt is 25yoF with complex and extensive history including uncontrolled Type 1 DM, CKD, HTN, anasarca,  proteinuria, anemia of chronic disease, discitis, diabetic gastroparesis, suspected eosinophilic esophagitis among others.  When she connects for her appointment today, she is laying in bed buried in covers.  She was asked to get up and sit upright to be more alert and interactive with her appointment but she remained in bed.   She Went to ER last Tuesday for rectal bleeding.  She Has appt to f/u with GI 09/06/21.  Pt had internal hemorrhoids on colonoscopy done March 2022.    She is working at [employer] in [place].  She says it's going okay.  Pt was given letter she requested 03/28/21 stating it was not recommended she RTW.  She Hasn't seen endocrinology since may.   She had appointment there earlier this month but was turned away because she didn't bring her bs log nor her bs meter.    She saw nephrology 07/13/21.  They want to follow up with her 1 month.  She has appointment to follow up with ID in September.  ID treated her discitis.   Pt was referred to Pain management at her request but they declined to see pt  She has appt for rehab tomorrow  She has  appt with gyn 08/01/21  Pt says the last time she checked her bs was sometime last night and it was "3- something"   pt says she Feels "like poop".    She was in ER recently with rectal bleeding and her bs was > 900.  Pt says she's "used to it".  Discussed with her that although she may be "used to it", bs elevations like that are very unhealthy and can lead to premature death.  She is unmoved.    She didn't work yesterday but is supposed to today - 11am -5pm.    Per cardiology who ordered Cardiac MRI- no evidence hypertrophic cardiomyopathy or infiltrative disease     Relevant past medical, surgical, family and social history reviewed and updated as indicated. Interim medical history since our last visit reviewed. Allergies and medications reviewed and updated.   Current Outpatient Medications:    acetaminophen (TYLENOL) 500 MG tablet, Take by mouth., Disp: , Rfl:    albuterol (VENTOLIN HFA) 108 (90 Base) MCG/ACT inhaler, Inhale 2 puffs into the lungs every 6 (six) hours as needed for wheezing or shortness of breath. For shortness of breath, Disp: 3 each, Rfl: 0   furosemide (LASIX) 20 MG tablet, Take 20 mg by mouth daily., Disp: , Rfl:    gabapentin (NEURONTIN) 100 MG capsule, Take 1 capsule (100 mg total) by mouth 2 (two)  times daily., Disp: 180 capsule, Rfl: 5   hydrALAZINE (APRESOLINE) 25 MG tablet, Take 1 tablet (25 mg total) by mouth 2 (two) times daily., Disp: 90 tablet, Rfl: 3   hydrochlorothiazide (HYDRODIURIL) 25 MG tablet, Take 25 mg by mouth daily., Disp: , Rfl:    insulin glargine (LANTUS) 100 UNIT/ML injection, Inject 40 Units into the skin at bedtime., Disp: , Rfl:    isosorbide mononitrate (IMDUR) 30 MG 24 hr tablet, Take 0.5 tablets (15 mg total) by mouth daily. (Patient taking differently: Take 30 mg by mouth daily.), Disp: , Rfl:    metoCLOPramide (REGLAN) 5 MG tablet, Take 1 tablet (5 mg total) by mouth every 8 (eight) hours as needed for refractory nausea / vomiting  (refractory symptoms)., Disp: 90 tablet, Rfl: 0   metoprolol tartrate (LOPRESSOR) 50 MG tablet, Take 1 tablet (50 mg total) by mouth 2 (two) times daily., Disp: 180 tablet, Rfl: 0   omeprazole (PRILOSEC) 40 MG capsule, Take 1 capsule (40 mg total) by mouth in the morning and at bedtime., Disp: 18 capsule, Rfl: 0   potassium chloride (KLOR-CON) 10 MEQ tablet, Take 10 mEq by mouth daily., Disp: , Rfl:    promethazine (PHENERGAN) 25 MG tablet, Take 1 tablet (25 mg total) by mouth every 8 (eight) hours as needed for nausea or vomiting., Disp: 20 tablet, Rfl: 0   Insulin Glulisine (APIDRA ), Inject 10-16 Units into the skin 3 (three) times daily before meals. ssi, Disp: , Rfl:    loperamide (IMODIUM A-D) 2 MG tablet, Take 1 tablet (2 mg total) by mouth 4 (four) times daily as needed for diarrhea or loose stools. (Patient not taking: Reported on 07/25/2021), Disp: 30 tablet, Rfl: 0   ondansetron (ZOFRAN ODT) 8 MG disintegrating tablet, Take 1 tablet (8 mg total) by mouth every 8 (eight) hours as needed for nausea or vomiting. (Patient not taking: Reported on 07/25/2021), Disp: 20 tablet, Rfl: 2    Review of Systems  Per HPI unless specifically indicated above     Objective:    There were no vitals taken for this visit.  Wt Readings from Last 3 Encounters:  07/03/21 118 lb (53.5 kg)  05/29/21 129 lb 9.6 oz (58.8 kg)  05/16/21 119 lb (54 kg)    Physical Exam Constitutional:      General: She is not in acute distress.    Appearance: She is not toxic-appearing.  HENT:     Head: Normocephalic and atraumatic.  Pulmonary:     Effort: No respiratory distress.  Neurological:     Mental Status: Mental status is at baseline.  Psychiatric:     Comments: Apathetic affect                     Assessment & Plan:    Encounter Diagnoses  Name Primary?   Uncontrolled type 1 diabetes mellitus with hyperglycemia ([place]) Yes   Primary hypertension        -Pt is urged to call to reschedule  witih endocrinology.  She is encouraged to monitor her bs and take her log and meter with her to those appointments.  She is to continue with nephrololgy per their recommendation.   Pt is to continue with her other specialists per their recomendation -no changes today -pt to follow up 3 months.  She is to contact office sooner prn

## 2021-07-26 ENCOUNTER — Encounter (HOSPITAL_COMMUNITY): Payer: Self-pay | Admitting: Physical Therapy

## 2021-07-26 ENCOUNTER — Other Ambulatory Visit: Payer: Self-pay

## 2021-07-26 ENCOUNTER — Ambulatory Visit (HOSPITAL_COMMUNITY): Payer: Self-pay | Attending: Infectious Disease | Admitting: Physical Therapy

## 2021-07-26 ENCOUNTER — Telehealth: Payer: Self-pay

## 2021-07-26 DIAGNOSIS — R2689 Other abnormalities of gait and mobility: Secondary | ICD-10-CM | POA: Insufficient documentation

## 2021-07-26 DIAGNOSIS — M6281 Muscle weakness (generalized): Secondary | ICD-10-CM | POA: Insufficient documentation

## 2021-07-26 DIAGNOSIS — M545 Low back pain, unspecified: Secondary | ICD-10-CM | POA: Insufficient documentation

## 2021-07-26 DIAGNOSIS — R29898 Other symptoms and signs involving the musculoskeletal system: Secondary | ICD-10-CM | POA: Insufficient documentation

## 2021-07-26 NOTE — Telephone Encounter (Signed)
Patient calling stating the pain in her back is getting worse and she is having swelling. Patient states she is concerned the infection is back. Please advise. Carl [name] T Brooks Sailors

## 2021-07-26 NOTE — Therapy (Signed)
[place], [place], [address] Phone: [phone]   Fax:  [phone]  Physical Therapy Evaluation  Patient Details  Name: Diamond [name] MRN: [id number] Date of Birth: 1996-08-04 Referring Provider (PT): Rhina Brackett Dam MD   Encounter Date: 07/26/2021   PT End of Session - 07/26/21 1417     Visit Number 1    Number of Visits 8    Date for PT Re-Evaluation 08/23/21    Authorization Type Self Pay    PT Start Time 7939   arrives 15 minutes late and takes restroom break   PT Stop Time 0300    PT Time Calculation (min) 25 min    Activity Tolerance Patient tolerated treatment well    Behavior During Therapy [facility] for tasks assessed/performed             Past Medical History:  Diagnosis Date   ADHD (attention deficit hyperactivity disorder) 2007   Allergy    Arm DVT (deep venous thromboembolism), acute, right ([place]) 04/07/2021   Asthma    Bipolar disorder ([place])    Crohn's colitis ([place])    Dx Dr. Cherrie Gauze   Diabetic nephropathy [facility])    Discitis of lumbar region 05/29/2021   Edema    Eosinophilic esophagitis    GERD (gastroesophageal reflux disease)    Hypertension    MSSA bacteremia 04/07/2021   Nephrotic syndrome    Pancreatitis    Panic attacks    PTSD (post-traumatic stress disorder)    Stroke ([place]) 07/2020   R side   Type 1 diabetes ([place]) 2008    Past Surgical History:  Procedure Laterality Date   ADENOIDECTOMY     BIOPSY  02/01/2021   Procedure: BIOPSY;  Surgeon: Rogene Houston, MD;  [facility];  Service: Endoscopy;;  duodenal, esophageal,   BIOPSY  02/03/2021   Procedure: BIOPSY;  Surgeon: Eloise Harman, DO;  [facility];  Service: Endoscopy;;   COLONOSCOPY WITH PROPOFOL N/A 02/01/2021   Procedure: COLONOSCOPY WITH PROPOFOL;  Surgeon: Rogene Houston, MD;  [facility];  Service: Endoscopy;  Laterality: N/A;   COLONOSCOPY WITH PROPOFOL N/A  02/03/2021   Procedure: COLONOSCOPY WITH PROPOFOL;  Surgeon: Eloise Harman, DO;  [facility];  Service: Endoscopy;  Laterality: N/A;   ESOPHAGOGASTRODUODENOSCOPY (EGD) WITH PROPOFOL N/A 02/01/2021   Procedure: ESOPHAGOGASTRODUODENOSCOPY (EGD) WITH PROPOFOL;  Surgeon: Rogene Houston, MD;  [facility];  Service: Endoscopy;  Laterality: N/A;   HERNIA REPAIR     TEE WITHOUT CARDIOVERSION N/A 03/09/2021   Procedure: TRANSESOPHAGEAL ECHOCARDIOGRAM (TEE) WITH PROPOFOL;  Surgeon: Arnoldo Lenis, MD;  [facility];  Service: Endoscopy;  Laterality: N/A;   TONSILLECTOMY      There were no vitals filed for this visit.    Subjective Assessment - 07/26/21 1424     Subjective Patient is a 25 y.o. female who presents to physical therapy with c/o LBP. Patient states symptoms began about a year ago after a MRSA infection. Symptoms worse with bending, lifting, sitting. Symptoms improve with standing and pushing back against things. Her main goal is to get her back back together.    Limitations Sitting;House hold activities    Patient Stated Goals get back back together    Currently in Pain? Yes    Pain Score 7     Pain Location Back    Pain Orientation Lower    Pain  Descriptors / Indicators Stabbing;Spasm    Pain Type Chronic pain    Pain Onset More than a month ago    Pain Frequency Constant                [facility] PT Assessment - 07/26/21 0001       Assessment   Medical Diagnosis Severe Back Pain    Referring Provider (PT) [name] MD    Onset Date/Surgical Date 07/26/20    Next MD Visit Next month    Prior Therapy yes      Precautions   Precautions None      Restrictions   Weight Bearing Restrictions No      Balance Screen   Has the patient fallen in the past 6 months Yes    How many times? 1    Has the patient had a decrease in activity level because of a fear of falling?  No    Is the patient reluctant to leave their home because  of a fear of falling?  No      Prior Function   Level of Independence Independent    Vocation Part time employment    Vocation Requirements [employer]      Cognition   Overall Cognitive Status Within Functional Limits for tasks assessed      Observation/Other Assessments   Observations Ambulates without AD, bilateral LE edema    Focus on Therapeutic Outcomes (FOTO)  n/a - not uploaded      ROM / Strength   AROM / PROM / Strength AROM;Strength      AROM   AROM Assessment Site Lumbar    Lumbar Flexion 25% limited, pain and requires UE support    Lumbar Extension 25% limited, pain    Lumbar - Right Side Bend 25% limited, pain    Lumbar - Left Side Bend 25% limited, pain    Lumbar - Right Rotation 0% limited    Lumbar - Left Rotation 0% limited      Strength   Strength Assessment Site Hip;Knee;Ankle    Right/Left Hip Right;Left    Right Hip Flexion 3-/5    Left Hip Flexion 3-/5    Right/Left Knee Right;Left    Right Knee Flexion 3+/5    Right Knee Extension 3+/5    Left Knee Flexion 3+/5    Left Knee Extension 3+/5    Right/Left Ankle Right;Left    Right Ankle Dorsiflexion 3/5    Left Ankle Dorsiflexion 3/5      Palpation   Palpation comment bilateral LE pitting edema; TTP bilateral LE paraspinals L>R      Transfers   Comments slow, labored, with use of hands      Ambulation/Gait   Ambulation Distance (Feet) 100 Feet    Assistive device None    Gait Pattern Wide base of support    Ambulation Surface Level;Indoor    Gait velocity decreased    Gait Comments slow, labored cadence with slight unsteadiness without use of AD, poor foot clearance bilateral                        Objective measurements completed on examination: See above findings.       [facility] Adult PT Treatment/Exercise - 07/26/21 0001       Exercises   Exercises Lumbar      Lumbar Exercises: Stretches   Standing Extension 10 reps  PT Education -  07/26/21 1417     Education Details Patient educated on exam findings, POC, scope of PT    Person(s) Educated Patient    Methods Explanation;Demonstration    Comprehension Verbalized understanding;Returned demonstration              PT Short Term Goals - 07/26/21 1500       PT SHORT TERM GOAL #1   Title Patient will be independent with HEP in order to improve functional outcomes.    Time 2    Period Weeks    Status New    Target Date 08/09/21      PT SHORT TERM GOAL #2   Title Patient will report at least 25% improvement in symptoms for improved quality of life.    Time 2    Period Weeks    Status New    Target Date 08/09/21               PT Long Term Goals - 07/26/21 1501       PT LONG TERM GOAL #1   Title Patient will be able to ambulate at least 250 feet during 2MWT with LRAD to demonstrate improved ability to perform functional mobility and associated tasks.    Time 4    Period Weeks    Status New    Target Date 08/23/21      PT LONG TERM GOAL #2   Title Patient will report at least 75% improvement in symptoms for improved quality of life.    Time 4    Period Weeks    Status New    Target Date 08/23/21      PT LONG TERM GOAL #3   Title Patient will demonstrate at least 25% improvement in lumbar ROM in all restricted planes for improved ability to move trunk while completing chores.    Time 4    Period Weeks    Status New    Target Date 08/23/21                    Plan - 07/26/21 1453     Clinical Impression Statement Session limited by patient's late arrival and using rest room upon arrival for session. Treatment limited secondary to patient not wanting to perform much mobility today. Patient is a 25 y.o. female who presents to physical therapy with c/o LBP. She presents with pain limited deficits in lumbar and bilateral LE strength, ROM, endurance, gait, postural impairments, tender musculature, and functional mobility with ADL. She is  having to modify and restrict ADL as indicated by subjective information and objective measures which is affecting overall participation. Patient will benefit from skilled physical therapy in order to improve function and reduce impairment.    Personal Factors and Comorbidities Fitness;Past/Current Experience;Behavior Pattern;Comorbidity 3+;Time since onset of injury/illness/exacerbation    Comorbidities Diabetes, CVA, HTN, chronic back pain, hx MRSA    Examination-Activity Limitations Locomotion Level;Transfers;Bed Mobility;Squat;Stairs;Lift;Sit;Bend    Examination-Participation Restrictions Meal Prep;Cleaning;Occupation;Yard Work;Volunteer;Shop;Laundry;Community Activity    Stability/Clinical Decision Making Evolving/Moderate complexity    Clinical Decision Making Moderate    Rehab Potential Fair    PT Frequency 2x / week    PT Duration 4 weeks    PT Treatment/Interventions ADLs/Self Care Home Management;Aquatic Therapy;Cryotherapy;Electrical Stimulation;Iontophoresis 67m/ml Dexamethasone;Moist Heat;Traction;Ultrasound;DME Instruction;Gait training;Stair training;Functional mobility training;Therapeutic activities;Therapeutic exercise;Balance training;Neuromuscular re-education;Patient/family education;Orthotic Fit/Training;Manual techniques;Manual lymph drainage;Compression bandaging;Scar mobilization;Passive range of motion;Dry needling;Energy conservation;Splinting;Taping    PT Next Visit Plan lumbar mobility, core and hip strenthening, begin  with table exercises, functional strengthening as able    PT Home Exercise Plan standiing lumbar extension    Consulted and Agree with Plan of Care Patient             Patient will benefit from skilled therapeutic intervention in order to improve the following deficits and impairments:  Abnormal gait, Decreased range of motion, Decreased endurance, Increased muscle spasms, Decreased activity tolerance, Pain, Impaired perceived functional ability,  Decreased balance, Impaired flexibility, Improper body mechanics, Decreased mobility, Decreased strength, Increased edema  Visit Diagnosis: Low back pain, unspecified back pain laterality, unspecified chronicity, unspecified whether sciatica present  Muscle weakness (generalized)  Other abnormalities of gait and mobility  Other symptoms and signs involving the musculoskeletal system     Problem List Patient Active Problem List   Diagnosis Date Noted   Discitis of lumbar region 05/29/2021   Diarrhea 04/11/2021   Upper abdominal pain 30/06/6225   Eosinophilic esophagitis 33/35/4562   MSSA bacteremia 04/07/2021   Arm DVT (deep venous thromboembolism), acute, right ([place]) 04/07/2021   AKI (acute kidney injury) ([place]) 04/01/2021   Pyelonephritis 03/31/2021   Severe low back pain    Intractable nausea and vomiting 03/16/2021   Nausea with vomiting    DM1 with Diabetic nephropathy with proteinuria and Nephrotic Syndrome- 03/10/2021    Class: Chronic   Multifocal pneumonia 03/05/2021   Type 2 or unspecified type diabetes mellitus 02/23/2021   Bladder outlet obstruction    Constipation    Abdominal pain, epigastric    Abnormal computed tomography of cecum and terminal ileum    Hyperosmolar hyperglycemic state (HHS) ([place]) 01/30/2021   Crohn's disease ([place]) 12/08/2020   Diabetic gastroparesis ([place]) 12/08/2020   Essential hypertension, benign 11/01/2020   Edema 10/19/2020   Anasarca 10/18/2020   Hypoalbuminemia-due to proteinuria/nephrotic syndrome 10/18/2020   Personal history of noncompliance with medical treatment, presenting hazards to health 10/12/2020   Bilateral lower extremity edema 08/25/2020   Pneumonia 08/25/2020   Ileus ([place]) 08/06/2020   Methicillin susceptible Staphylococcus aureus infection, unspecified site 07/08/2020   Cellulitis and abscess of buttock 06/20/2020   Skin abscess 04/24/2020   Mild intermittent asthma without complication 56/38/9373   Sepsis due to  skin infection ([place]) 04/17/2020   Type 1 diabetes mellitus, uncontrolled ([place]) 10/01/2013   Eczema 10/23/2012   Abdominal pain 08/27/2012   GERD (gastroesophageal reflux disease) 07/25/2012   Mood disorder ([place]) 07/09/2012   Contraception management 07/09/2012   History of Crohn's disease 07/09/2012   Uncontrolled type 1 diabetes mellitus with hyperglycemia and Diabetic Nephropathy 07/08/2012    Class: Chronic   Asthma, persistent controlled 07/08/2012    3:03 PM, 07/26/21 Mearl Latin PT, DPT Physical Therapist at [place], [place], [address] Phone: [phone]   Fax:  [phone]  Name: Diamond [name] MRN: [id number] Date of Birth: 24-Apr-1996

## 2021-07-27 NOTE — Telephone Encounter (Signed)
RN relayed KVD's message about MRI results and offered follow up appointment. Patient declined appointment.   Gladyce Mcray Lorita Officer, RN

## 2021-07-28 ENCOUNTER — Telehealth (HOSPITAL_COMMUNITY): Payer: Self-pay

## 2021-07-28 ENCOUNTER — Ambulatory Visit (HOSPITAL_COMMUNITY): Payer: Self-pay

## 2021-07-28 NOTE — Telephone Encounter (Signed)
No show, called and talked to pt concerning missed apt today.  Pt stated she was not feeling good today and not aware she had an apt today.  Reminded next apt date and time with contact information included if needs to cancel/reschedule apts.  Educated no show policy details during discussion.    Ihor Austin, LPTA/CLT; Delana Meyer [phone]

## 2021-08-01 ENCOUNTER — Ambulatory Visit (HOSPITAL_COMMUNITY): Payer: Self-pay | Admitting: Physical Therapy

## 2021-08-02 ENCOUNTER — Telehealth (HOSPITAL_COMMUNITY): Payer: Self-pay | Admitting: Physical Therapy

## 2021-08-02 NOTE — Telephone Encounter (Signed)
Patient no show for appointment on 8/30. Spoke with patient about missed appointment and reminded her of next appointment. Instructed her to call if she was unable to attend.  8:35 AM, 08/02/21 Diamond [name] PT, DPT Physical Therapist at [facility]

## 2021-08-04 ENCOUNTER — Ambulatory Visit: Payer: Self-pay | Admitting: Infectious Disease

## 2021-08-08 ENCOUNTER — Ambulatory Visit: Payer: Self-pay | Admitting: Infectious Disease

## 2021-08-09 ENCOUNTER — Telehealth (HOSPITAL_COMMUNITY): Payer: Self-pay | Admitting: Physical Therapy

## 2021-08-09 ENCOUNTER — Ambulatory Visit (HOSPITAL_COMMUNITY): Payer: Self-pay | Attending: Infectious Disease | Admitting: Physical Therapy

## 2021-08-09 NOTE — Telephone Encounter (Signed)
Unable to reach patient regarding NS to therapy.  Per NS policy, cancelled all remaining appointments except next one. If she does not show for next appointment will need to be discharged from therapy.   Teena Irani, PTA/CLT [phone]

## 2021-08-11 ENCOUNTER — Other Ambulatory Visit: Payer: Self-pay

## 2021-08-11 ENCOUNTER — Encounter (HOSPITAL_COMMUNITY): Payer: Self-pay | Admitting: Emergency Medicine

## 2021-08-11 ENCOUNTER — Inpatient Hospital Stay (HOSPITAL_COMMUNITY)
Admission: EM | Admit: 2021-08-11 | Discharge: 2021-08-16 | DRG: 637 | Disposition: A | Discharge status: Home / Self Care | Payer: Self-pay | Attending: Internal Medicine | Admitting: Internal Medicine

## 2021-08-11 ENCOUNTER — Emergency Department (HOSPITAL_COMMUNITY): Payer: Medicaid Other

## 2021-08-11 DIAGNOSIS — E1010 Type 1 diabetes mellitus with ketoacidosis without coma: Principal | ICD-10-CM | POA: Diagnosis present

## 2021-08-11 DIAGNOSIS — F4310 Post-traumatic stress disorder, unspecified: Secondary | ICD-10-CM | POA: Diagnosis present

## 2021-08-11 DIAGNOSIS — Z87441 Personal history of nephrotic syndrome: Secondary | ICD-10-CM

## 2021-08-11 DIAGNOSIS — K3184 Gastroparesis: Secondary | ICD-10-CM

## 2021-08-11 DIAGNOSIS — F909 Attention-deficit hyperactivity disorder, unspecified type: Secondary | ICD-10-CM | POA: Diagnosis present

## 2021-08-11 DIAGNOSIS — Z91048 Other nonmedicinal substance allergy status: Secondary | ICD-10-CM

## 2021-08-11 DIAGNOSIS — Z9114 Patient's other noncompliance with medication regimen: Secondary | ICD-10-CM

## 2021-08-11 DIAGNOSIS — Z880 Allergy status to penicillin: Secondary | ICD-10-CM

## 2021-08-11 DIAGNOSIS — K219 Gastro-esophageal reflux disease without esophagitis: Secondary | ICD-10-CM | POA: Diagnosis present

## 2021-08-11 DIAGNOSIS — Z888 Allergy status to other drugs, medicaments and biological substances status: Secondary | ICD-10-CM

## 2021-08-11 DIAGNOSIS — E1121 Type 2 diabetes mellitus with diabetic nephropathy: Secondary | ICD-10-CM

## 2021-08-11 DIAGNOSIS — Z794 Long term (current) use of insulin: Secondary | ICD-10-CM

## 2021-08-11 DIAGNOSIS — J181 Lobar pneumonia, unspecified organism: Secondary | ICD-10-CM | POA: Diagnosis present

## 2021-08-11 DIAGNOSIS — N179 Acute kidney failure, unspecified: Secondary | ICD-10-CM | POA: Diagnosis present

## 2021-08-11 DIAGNOSIS — E1043 Type 1 diabetes mellitus with diabetic autonomic (poly)neuropathy: Secondary | ICD-10-CM | POA: Diagnosis present

## 2021-08-11 DIAGNOSIS — Z20822 Contact with and (suspected) exposure to covid-19: Secondary | ICD-10-CM | POA: Diagnosis present

## 2021-08-11 DIAGNOSIS — J9601 Acute respiratory failure with hypoxia: Secondary | ICD-10-CM | POA: Diagnosis present

## 2021-08-11 DIAGNOSIS — R0789 Other chest pain: Secondary | ICD-10-CM | POA: Diagnosis present

## 2021-08-11 DIAGNOSIS — J189 Pneumonia, unspecified organism: Secondary | ICD-10-CM

## 2021-08-11 DIAGNOSIS — I10 Essential (primary) hypertension: Secondary | ICD-10-CM | POA: Diagnosis present

## 2021-08-11 DIAGNOSIS — F319 Bipolar disorder, unspecified: Secondary | ICD-10-CM | POA: Diagnosis present

## 2021-08-11 DIAGNOSIS — Z8673 Personal history of transient ischemic attack (TIA), and cerebral infarction without residual deficits: Secondary | ICD-10-CM

## 2021-08-11 DIAGNOSIS — F39 Unspecified mood [affective] disorder: Secondary | ICD-10-CM

## 2021-08-11 DIAGNOSIS — Z86718 Personal history of other venous thrombosis and embolism: Secondary | ICD-10-CM

## 2021-08-11 DIAGNOSIS — Z833 Family history of diabetes mellitus: Secondary | ICD-10-CM

## 2021-08-11 DIAGNOSIS — E1165 Type 2 diabetes mellitus with hyperglycemia: Secondary | ICD-10-CM

## 2021-08-11 DIAGNOSIS — E8770 Fluid overload, unspecified: Secondary | ICD-10-CM | POA: Diagnosis not present

## 2021-08-11 DIAGNOSIS — E1143 Type 2 diabetes mellitus with diabetic autonomic (poly)neuropathy: Secondary | ICD-10-CM

## 2021-08-11 DIAGNOSIS — R778 Other specified abnormalities of plasma proteins: Secondary | ICD-10-CM | POA: Diagnosis present

## 2021-08-11 DIAGNOSIS — E1021 Type 1 diabetes mellitus with diabetic nephropathy: Secondary | ICD-10-CM | POA: Diagnosis present

## 2021-08-11 DIAGNOSIS — Z881 Allergy status to other antibiotic agents status: Secondary | ICD-10-CM

## 2021-08-11 DIAGNOSIS — E1100 Type 2 diabetes mellitus with hyperosmolarity without nonketotic hyperglycemic-hyperosmolar coma (NKHHC): Secondary | ICD-10-CM

## 2021-08-11 DIAGNOSIS — K5090 Crohn's disease, unspecified, without complications: Secondary | ICD-10-CM | POA: Diagnosis present

## 2021-08-11 DIAGNOSIS — Z8249 Family history of ischemic heart disease and other diseases of the circulatory system: Secondary | ICD-10-CM

## 2021-08-11 DIAGNOSIS — Z91040 Latex allergy status: Secondary | ICD-10-CM

## 2021-08-11 LAB — BASIC METABOLIC PANEL
Anion gap: 13 (ref 5–15)
Anion gap: 6 (ref 5–15)
BUN: 25 mg/dL — ABNORMAL HIGH (ref 6–20)
BUN: 22 mg/dL — ABNORMAL HIGH (ref 6–20)
CO2: 20 mmol/L — ABNORMAL LOW (ref 22–32)
CO2: 24 mmol/L (ref 22–32)
Calcium: 8.2 mg/dL — ABNORMAL LOW (ref 8.9–10.3)
Calcium: 8.2 mg/dL — ABNORMAL LOW (ref 8.9–10.3)
Chloride: 95 mmol/L — ABNORMAL LOW (ref 98–111)
Chloride: 101 mmol/L (ref 98–111)
Creatinine, Ser: 1.34 mg/dL — ABNORMAL HIGH (ref 0.44–1.00)
Creatinine, Ser: 1.2 mg/dL — ABNORMAL HIGH (ref 0.44–1.00)
GFR, Estimated: 56 mL/min — ABNORMAL LOW (ref >60)
GFR, Estimated: >60 mL/min (ref >60)
Glucose, Bld: 897 mg/dL (ref 70–99)
Glucose, Bld: 528 mg/dL (ref 70–99)
Potassium: 4.7 mmol/L (ref 3.5–5.1)
Potassium: 4 mmol/L (ref 3.5–5.1)
Sodium: 128 mmol/L — ABNORMAL LOW (ref 135–145)
Sodium: 131 mmol/L — ABNORMAL LOW (ref 135–145)

## 2021-08-11 LAB — BLOOD GAS, VENOUS
Acid-base deficit: 1.2 mmol/L (ref 0.0–2.0)
Bicarbonate: 20.5 mmol/L (ref 20.0–28.0)
FIO2: 36
O2 Saturation: 48.3 %
Patient temperature: 37.6
pCO2, Ven: 61.8 mmHg — ABNORMAL HIGH (ref 44.0–60.0)
pH, Ven: 7.237 — ABNORMAL LOW (ref 7.250–7.430)
pO2, Ven: 33.5 mmHg (ref 32.0–45.0)

## 2021-08-11 LAB — COMPREHENSIVE METABOLIC PANEL
ALT: 16 U/L (ref 0–44)
AST: 14 U/L — ABNORMAL LOW (ref 15–41)
Albumin: 2.7 g/dL — ABNORMAL LOW (ref 3.5–5.0)
Alkaline Phosphatase: 109 U/L (ref 38–126)
Anion gap: 10 (ref 5–15)
BUN: 26 mg/dL — ABNORMAL HIGH (ref 6–20)
CO2: 24 mmol/L (ref 22–32)
Calcium: 8.8 mg/dL — ABNORMAL LOW (ref 8.9–10.3)
Chloride: 96 mmol/L — ABNORMAL LOW (ref 98–111)
Creatinine, Ser: 1.37 mg/dL — ABNORMAL HIGH (ref 0.44–1.00)
GFR, Estimated: 55 mL/min — ABNORMAL LOW (ref >60)
Glucose, Bld: 1008 mg/dL (ref 70–99)
Potassium: 4.8 mmol/L (ref 3.5–5.1)
Sodium: 130 mmol/L — ABNORMAL LOW (ref 135–145)
Total Bilirubin: 1 mg/dL (ref 0.3–1.2)
Total Protein: 7.1 g/dL (ref 6.5–8.1)

## 2021-08-11 LAB — CBC WITH DIFFERENTIAL/PLATELET
Band Neutrophils: 15 %
Basophils Absolute: 0 10*3/uL (ref 0.0–0.1)
Basophils Relative: 0 %
Eosinophils Absolute: 0 10*3/uL (ref 0.0–0.5)
Eosinophils Relative: 0 %
HCT: 45.8 % (ref 36.0–46.0)
Hemoglobin: 14.7 g/dL (ref 12.0–15.0)
Lymphocytes Relative: 6 %
Lymphs Abs: 0.6 10*3/uL — ABNORMAL LOW (ref 0.7–4.0)
MCH: 30 pg (ref 26.0–34.0)
MCHC: 32.1 g/dL (ref 30.0–36.0)
MCV: 93.5 fL (ref 80.0–100.0)
Metamyelocytes Relative: 4 %
Monocytes Absolute: 0.1 10*3/uL (ref 0.1–1.0)
Monocytes Relative: 1 %
Neutro Abs: 9.2 10*3/uL — ABNORMAL HIGH (ref 1.7–7.7)
Neutrophils Relative %: 74 %
Platelets: 289 10*3/uL (ref 150–400)
RBC: 4.9 MIL/uL (ref 3.87–5.11)
RDW: 12.6 % (ref 11.5–15.5)
WBC: 10.3 10*3/uL (ref 4.0–10.5)
nRBC: 0 % (ref 0.0–0.2)

## 2021-08-11 LAB — PROCALCITONIN: Procalcitonin: 11.42 ng/mL

## 2021-08-11 LAB — TROPONIN I (HIGH SENSITIVITY)
Troponin I (High Sensitivity): 72 ng/L — ABNORMAL HIGH (ref <18)
Troponin I (High Sensitivity): 106 ng/L (ref <18)
Troponin I (High Sensitivity): 128 ng/L (ref <18)
Troponin I (High Sensitivity): 132 ng/L (ref <18)

## 2021-08-11 LAB — RESP PANEL BY RT-PCR (FLU A&B, COVID) ARPGX2
Influenza A by PCR: NEGATIVE
Influenza B by PCR: NEGATIVE
SARS Coronavirus 2 by RT PCR: NEGATIVE

## 2021-08-11 LAB — MRSA NEXT GEN BY PCR, NASAL: MRSA by PCR Next Gen: NOT DETECTED

## 2021-08-11 LAB — GLUCOSE, CAPILLARY
Glucose-Capillary: 297 mg/dL — ABNORMAL HIGH (ref 70–99)
Glucose-Capillary: 394 mg/dL — ABNORMAL HIGH (ref 70–99)
Glucose-Capillary: 452 mg/dL — ABNORMAL HIGH (ref 70–99)
Glucose-Capillary: 492 mg/dL — ABNORMAL HIGH (ref 70–99)
Glucose-Capillary: 493 mg/dL — ABNORMAL HIGH (ref 70–99)
Glucose-Capillary: 500 mg/dL — ABNORMAL HIGH (ref 70–99)
Glucose-Capillary: 562 mg/dL (ref 70–99)
Glucose-Capillary: >600 mg/dL (ref 70–99)

## 2021-08-11 LAB — BRAIN NATRIURETIC PEPTIDE: B Natriuretic Peptide: 192 pg/mL — ABNORMAL HIGH (ref 0.0–100.0)

## 2021-08-11 LAB — CBG MONITORING, ED
Glucose-Capillary: >600 mg/dL (ref 70–99)
Glucose-Capillary: >600 mg/dL (ref 70–99)

## 2021-08-11 LAB — LIPASE, BLOOD: Lipase: 41 U/L (ref 11–51)

## 2021-08-11 LAB — HEMOGLOBIN A1C
Hgb A1c MFr Bld: 13.5 % — ABNORMAL HIGH (ref 4.8–5.6)
Mean Plasma Glucose: 340.75 mg/dL

## 2021-08-11 LAB — BETA-HYDROXYBUTYRIC ACID: Beta-Hydroxybutyric Acid: 1.46 mmol/L — ABNORMAL HIGH (ref 0.05–0.27)

## 2021-08-11 LAB — D-DIMER, QUANTITATIVE: D-Dimer, Quant: 0.8 ug/mL-FEU — ABNORMAL HIGH (ref 0.00–0.50)

## 2021-08-11 MED ORDER — LACTATED RINGERS IV SOLN: INTRAVENOUS | Status: DC

## 2021-08-11 MED ORDER — ACETAMINOPHEN 325 MG PO TABS
650.0000 mg | ORAL_TABLET | Freq: Four times a day (QID) | ORAL | Status: DC | PRN
  Administered 2021-08-11 – 2021-08-15 (×3): 650 mg via ORAL
  Filled 2021-08-11 (×3): qty 2

## 2021-08-11 MED ORDER — LEVOFLOXACIN IN D5W 500 MG/100ML IV SOLN
500.0000 mg | INTRAVENOUS | Status: DC
  Administered 2021-08-12 – 2021-08-15 (×4): 500 mg via INTRAVENOUS
  Filled 2021-08-11 (×5): qty 100

## 2021-08-11 MED ORDER — CHLORHEXIDINE GLUCONATE CLOTH 2 % EX PADS
6.0000 | MEDICATED_PAD | Freq: Every day | CUTANEOUS | Status: DC
  Administered 2021-08-11 – 2021-08-16 (×3): 6 via TOPICAL

## 2021-08-11 MED ORDER — DEXTROSE IN LACTATED RINGERS 5 % IV SOLN
INTRAVENOUS | Status: DC
Start: 2021-08-11 — End: 2021-08-12

## 2021-08-11 MED ORDER — DEXTROSE 50 % IV SOLN: 0.0000 mL | INTRAVENOUS | Status: DC | PRN

## 2021-08-11 MED ORDER — DEXTROSE IN LACTATED RINGERS 5 % IV SOLN: INTRAVENOUS | Status: DC

## 2021-08-11 MED ORDER — SODIUM CHLORIDE 0.9 % IV BOLUS: 1000.0000 mL | Freq: Once | INTRAVENOUS | Status: DC

## 2021-08-11 MED ORDER — MORPHINE SULFATE (PF) 2 MG/ML IV SOLN
2.0000 mg | Freq: Once | INTRAVENOUS | Status: AC
  Administered 2021-08-11: 2 mg via INTRAVENOUS
  Filled 2021-08-11: qty 1

## 2021-08-11 MED ORDER — LACTATED RINGERS IV BOLUS
20.0000 mL/kg | Freq: Once | INTRAVENOUS | Status: AC
  Administered 2021-08-11: 1344 mL via INTRAVENOUS

## 2021-08-11 MED ORDER — ONDANSETRON HCL 4 MG/2ML IJ SOLN
4.0000 mg | Freq: Four times a day (QID) | INTRAMUSCULAR | Status: DC | PRN
  Administered 2021-08-11 – 2021-08-16 (×6): 4 mg via INTRAVENOUS
  Filled 2021-08-11 (×6): qty 2

## 2021-08-11 MED ORDER — LIDOCAINE 5 % EX PTCH
1.0000 | MEDICATED_PATCH | CUTANEOUS | Status: DC
  Administered 2021-08-12: 1 via TRANSDERMAL
  Filled 2021-08-11 (×7): qty 1

## 2021-08-11 MED ORDER — LACTATED RINGERS IV BOLUS
20.0000 mL/kg | Freq: Once | INTRAVENOUS | Status: AC
  Administered 2021-08-11: 1000 mL via INTRAVENOUS

## 2021-08-11 MED ORDER — ENOXAPARIN SODIUM 40 MG/0.4ML IJ SOSY
40.0000 mg | PREFILLED_SYRINGE | INTRAMUSCULAR | Status: DC
  Administered 2021-08-14 – 2021-08-15 (×2): 40 mg via SUBCUTANEOUS
  Filled 2021-08-11 (×4): qty 0.4

## 2021-08-11 MED ORDER — POTASSIUM CHLORIDE 10 MEQ/100ML IV SOLN
10.0000 meq | INTRAVENOUS | Status: AC
Start: 2021-08-11 — End: 2021-08-11
  Administered 2021-08-11 (×2): 10 meq via INTRAVENOUS
  Filled 2021-08-11: qty 100

## 2021-08-11 MED ORDER — OXYCODONE HCL 5 MG PO TABS
5.0000 mg | ORAL_TABLET | ORAL | Status: DC | PRN
  Administered 2021-08-13 – 2021-08-16 (×3): 5 mg via ORAL
  Filled 2021-08-11 (×4): qty 1

## 2021-08-11 MED ORDER — DEXTROSE 50 % IV SOLN
0.0000 mL | INTRAVENOUS | Status: DC | PRN
  Administered 2021-08-14: 25 mL via INTRAVENOUS

## 2021-08-11 MED ORDER — LEVOFLOXACIN IN D5W 750 MG/150ML IV SOLN
750.0000 mg | Freq: Once | INTRAVENOUS | Status: AC
  Administered 2021-08-11: 750 mg via INTRAVENOUS
  Filled 2021-08-11: qty 150

## 2021-08-11 MED ORDER — INSULIN REGULAR(HUMAN) IN NACL 100-0.9 UT/100ML-% IV SOLN
INTRAVENOUS | Status: DC
  Administered 2021-08-11: 8 [IU]/h via INTRAVENOUS
  Administered 2021-08-12: 2 [IU]/h via INTRAVENOUS
  Filled 2021-08-11: qty 100

## 2021-08-11 MED ORDER — INSULIN REGULAR(HUMAN) IN NACL 100-0.9 UT/100ML-% IV SOLN
INTRAVENOUS | Status: DC
  Filled 2021-08-11: qty 100

## 2021-08-11 MED ORDER — POTASSIUM CHLORIDE 10 MEQ/100ML IV SOLN
10.0000 meq | INTRAVENOUS | Status: AC
  Administered 2021-08-11: 10 meq via INTRAVENOUS
  Filled 2021-08-11 (×2): qty 100

## 2021-08-11 NOTE — ED Notes (Signed)
Pt arrives with c/o n/v and elevated blood sugar >600. T1DM on insulin. Reports taking lisinopril and HCTZ which she had not taken today. Generalized edema throughout face abdomen and limbs x4. Abdomen very taught and distended, tender on palpation. BS x4 quadrants. O2 sats dropped to 83% on room air. Attempted O2 2L via [place] and rose to 90 and dropped back down to 86%. Placed on 4L and sats currently at 96%. Hx of stroke with left side weakness at baseline.

## 2021-08-11 NOTE — ED Triage Notes (Signed)
Pt states last night, got up and sugar was low. Drank some juice and since then felt n/v, sob, and cp

## 2021-08-11 NOTE — ED Provider Notes (Signed)
[facility] EMERGENCY DEPARTMENT Provider Note   CSN: [id number] Arrival date & time: 08/11/21  1232     History Chief Complaint  Patient presents with   Hyperglycemia    Diamond [name] is a 25 y.o. female.  HPI  Patient with significant medical history of poorly controlled type 1 diabetes, hypertension, CKD, anasarca, cystitis presents to the emergency department with chief complaint of nausea and vomiting.  Patient states this started last night, states that she started to feel dizzy so she checked her blood sugar and she noted that it was in the 60s, she then drank some orange juice and started to have some stomach pain nausea and vomiting.  She states that she continued to feel unwell into the morning, she states that now she has some chest pain shortness of breath and have abdominal tenderness.  She also notes that that she is having some visual changes, the visual changes started last night, states that her vision is blurry in both eyes, she denies headaches, paresthesias or weakness in the upper or lower extremities, denies recent head trauma, is not on anticoagulant.  She states that her stomach pain is diffuse, does not radiate, not associated with constipation or diarrhea, states that she has had frequent urination, denies hematuria or dysuria, she denies hematemesis or coffee-ground emesis.  She states that she has been her blood sugars have been running very high, in the 300.  States that she has been compliant with her medications.   Past Medical History:  Diagnosis Date   ADHD (attention deficit hyperactivity disorder) 2007   Allergy    Arm DVT (deep venous thromboembolism), acute, right ([place]) 04/07/2021   Asthma    Bipolar disorder ([place])    Crohn's colitis ([place])    Dx Dr. Cherrie Gauze   Diabetic nephropathy [facility])    Discitis of lumbar region 05/29/2021   Edema    Eosinophilic esophagitis    GERD (gastroesophageal reflux disease)    Hypertension    MSSA  bacteremia 04/07/2021   Nephrotic syndrome    Pancreatitis    Panic attacks    PTSD (post-traumatic stress disorder)    Stroke ([place]) 07/2020   R side   Type 1 diabetes ([place]) 2008    Patient Active Problem List   Diagnosis Date Noted   DKA, type 1 ([place]) 08/11/2021   Discitis of lumbar region 05/29/2021   Diarrhea 04/11/2021   Upper abdominal pain 49/75/3005   Eosinophilic esophagitis 10/04/1116   MSSA bacteremia 04/07/2021   Arm DVT (deep venous thromboembolism), acute, right ([place]) 04/07/2021   AKI (acute kidney injury) ([place]) 04/01/2021   Pyelonephritis 03/31/2021   Severe low back pain    Intractable nausea and vomiting 03/16/2021   Nausea with vomiting    DM1 with Diabetic nephropathy with proteinuria and Nephrotic Syndrome- 03/10/2021    Class: Chronic   Multifocal pneumonia 03/05/2021   Type 2 or unspecified type diabetes mellitus 02/23/2021   Bladder outlet obstruction    Constipation    Abdominal pain, epigastric    Abnormal computed tomography of cecum and terminal ileum    Hyperosmolar hyperglycemic state (HHS) ([place]) 01/30/2021   Crohn's disease ([place]) 12/08/2020   Diabetic gastroparesis ([place]) 12/08/2020   Essential hypertension, benign 11/01/2020   Edema 10/19/2020   Anasarca 10/18/2020   Hypoalbuminemia-due to proteinuria/nephrotic syndrome 10/18/2020   Personal history of noncompliance with medical treatment, presenting hazards to health 10/12/2020   Bilateral lower extremity edema 08/25/2020   Pneumonia 08/25/2020  Ileus ([place]) 08/06/2020   Methicillin susceptible Staphylococcus aureus infection, unspecified site 07/08/2020   Cellulitis and abscess of buttock 06/20/2020   Skin abscess 04/24/2020   Mild intermittent asthma without complication 68/34/1962   Sepsis due to skin infection ([place]) 04/17/2020   Type 1 diabetes mellitus, uncontrolled ([place]) 10/01/2013   Eczema 10/23/2012   Abdominal pain 08/27/2012   GERD (gastroesophageal reflux disease) 07/25/2012    Mood disorder ([place]) 07/09/2012   Contraception management 07/09/2012   History of Crohn's disease 07/09/2012   Uncontrolled type 1 diabetes mellitus with hyperglycemia and Diabetic Nephropathy 07/08/2012    Class: Chronic   Asthma, persistent controlled 07/08/2012    Past Surgical History:  Procedure Laterality Date   ADENOIDECTOMY     BIOPSY  02/01/2021   Procedure: BIOPSY;  Surgeon: Rogene Houston, MD;  Location: [facility];  Service: Endoscopy;;  duodenal, esophageal,   BIOPSY  02/03/2021   Procedure: BIOPSY;  Surgeon: Eloise Harman, DO;  Location: [facility];  Service: Endoscopy;;   COLONOSCOPY WITH PROPOFOL N/A 02/01/2021   Procedure: COLONOSCOPY WITH PROPOFOL;  Surgeon: Rogene Houston, MD;  Location: [facility];  Service: Endoscopy;  Laterality: N/A;   COLONOSCOPY WITH PROPOFOL N/A 02/03/2021   Procedure: COLONOSCOPY WITH PROPOFOL;  Surgeon: Eloise Harman, DO;  Location: [facility];  Service: Endoscopy;  Laterality: N/A;   ESOPHAGOGASTRODUODENOSCOPY (EGD) WITH PROPOFOL N/A 02/01/2021   Procedure: ESOPHAGOGASTRODUODENOSCOPY (EGD) WITH PROPOFOL;  Surgeon: Rogene Houston, MD;  [facility]: [facility];  Service: Endoscopy;  Laterality: N/A;   HERNIA REPAIR     TEE WITHOUT CARDIOVERSION N/A 03/09/2021   Procedure: TRANSESOPHAGEAL ECHOCARDIOGRAM (TEE) WITH PROPOFOL;  Surgeon: Arnoldo Lenis, MD;  Location: [facility];  Service: Endoscopy;  Laterality: N/A;   TONSILLECTOMY       OB History   No obstetric history on file.     Family History  Problem Relation Age of Onset   Hypertension Mother    Diabetes Mother    Heart disease Mother    Crohn's disease Father    Alcohol abuse Father    Drug abuse Father    Mental illness Father    Cancer Father    Hypertension Maternal Aunt    Seizures Maternal Aunt    Depression Maternal Aunt    Asthma Maternal Uncle    Hypertension Maternal Uncle    Cancer Maternal Grandmother    Diabetes Maternal Grandmother     Mental illness Maternal Grandfather     Social History   Tobacco Use   Smoking status: Never   Smokeless tobacco: Never  Vaping Use   Vaping Use: Never used  Substance Use Topics   Alcohol use: No   Drug use: No    Home Medications Prior to Admission medications   Medication Sig Start Date End Date Taking? Authorizing Provider  acetaminophen (TYLENOL) 500 MG tablet Take by mouth. 04/21/20  Yes [provider]  albuterol (VENTOLIN HFA) 108 (90 Base) MCG/ACT inhaler Inhale 2 puffs into the lungs every 6 (six) hours as needed for wheezing or shortness of breath. For shortness of breath 03/10/21  Yes Emokpae, Courage, MD  furosemide (LASIX) 20 MG tablet Take 60 mg by mouth daily. 04/25/20  Yes [provider]  gabapentin (NEURONTIN) 100 MG capsule Take 1 capsule (100 mg total) by mouth 2 (two) times daily. 03/10/21  Yes Emokpae, Courage, MD  hydrALAZINE (APRESOLINE) 25 MG tablet Take 1 tablet (25 mg total) by mouth  2 (two) times daily. 03/10/21  Yes Emokpae, Courage, MD  hydrochlorothiazide (HYDRODIURIL) 25 MG tablet Take 25 mg by mouth daily. 02/11/20  Yes [provider]  insulin glargine (LANTUS) 100 UNIT/ML injection Inject 40 Units into the skin at bedtime.   Yes [provider]  Insulin Glulisine (APIDRA [place]) Inject 10-16 Units into the skin 3 (three) times daily before meals. ssi   Yes [provider]  isosorbide mononitrate (IMDUR) 30 MG 24 hr tablet Take 0.5 tablets (15 mg total) by mouth daily. Patient taking differently: Take 30 mg by mouth daily. 03/19/21  Yes Barton Dubois, MD  metoCLOPramide (REGLAN) 5 MG tablet Take 1 tablet (5 mg total) by mouth every 8 (eight) hours as needed for refractory nausea / vomiting (refractory symptoms). 03/19/21 03/19/22 Yes Barton Dubois, MD  metoprolol tartrate (LOPRESSOR) 50 MG tablet Take 1 tablet (50 mg total) by mouth 2 (two) times daily. 03/23/21  Yes [name], PA-C  omeprazole (PRILOSEC) 40 MG  capsule Take 1 capsule (40 mg total) by mouth in the morning and at bedtime. 03/23/21  Yes [name], PA-C  potassium chloride (KLOR-CON) 10 MEQ tablet Take 10 mEq by mouth daily. 10/21/20  Yes [provider]  promethazine (PHENERGAN) 25 MG tablet Take 1 tablet (25 mg total) by mouth every 8 (eight) hours as needed for nausea or vomiting. 04/18/21  Yes [name], PA-C  loperamide (IMODIUM A-D) 2 MG tablet Take 1 tablet (2 mg total) by mouth 4 (four) times daily as needed for diarrhea or loose stools. Patient not taking: No sig reported 04/02/21   Manuella Ghazi, Pratik D, DO  ondansetron (ZOFRAN ODT) 8 MG disintegrating tablet Take 1 tablet (8 mg total) by mouth every 8 (eight) hours as needed for nausea or vomiting. Patient not taking: No sig reported 03/10/21   Roxan Hockey, MD    Allergies    Amoxicillin, Dexamethasone, Doxycycline, Penicillins, Ceftriaxone sodium in dextrose, Augmentin [amoxicillin-pot clavulanate], Clavulanic acid, Adhesive [tape], Latex, Other, and Pineapple  Review of Systems   Review of Systems  Constitutional:  Negative for chills and fever.  HENT:  Negative for congestion.   Respiratory:  Positive for shortness of breath.   Cardiovascular:  Positive for chest pain.  Gastrointestinal:  Positive for abdominal pain, diarrhea and vomiting.  Genitourinary:  Positive for frequency. Negative for enuresis.  Musculoskeletal:  Negative for back pain.  Skin:  Negative for rash.  Neurological:  Negative for dizziness.  Hematological:  Does not bruise/bleed easily.   Physical Exam Updated Vital Signs BP (!) 149/114   Pulse (!) 114   Temp 99.7 F (37.6 C) (Oral)   Resp (!) 9   Ht 4' 11"  (1.499 m)   Wt 67.2 kg   SpO2 100%   BMI 29.93 kg/m   Physical Exam Vitals and nursing note reviewed.  Constitutional:      General: She is not in acute distress.    Appearance: She is not ill-appearing.  HENT:     Head: Normocephalic and atraumatic.     Nose: No  congestion.     Mouth/Throat:     Mouth: Mucous membranes are dry.     Pharynx: Oropharynx is clear.  Eyes:     Extraocular Movements: Extraocular movements intact.     Conjunctiva/sclera: Conjunctivae normal.     Pupils: Pupils are equal, round, and reactive to light.  Cardiovascular:     Rate and Rhythm: Regular rhythm. Tachycardia present.     Pulses: Normal pulses.  Heart sounds: No murmur heard.   No friction rub. No gallop.  Pulmonary:     Effort: No respiratory distress.     Breath sounds: No wheezing, rhonchi or rales.  Abdominal:     Palpations: Abdomen is soft.     Tenderness: There is abdominal tenderness. There is no right CVA tenderness or left CVA tenderness.     Comments: Abdomen distended, dull to percussion, normoactive bowel sounds, she has generalized tenderness, no guarding, rebound tenderness, the peritoneal sign.  Musculoskeletal:     Comments: Patient has 2+ pitting edema up to the shins, bilaterally, no noted lymphedema skin changes, neurovascular intact in the upper and lower extremities.  Patient has full range of motion in the upper and lower extremities.  Skin:    General: Skin is warm and dry.  Neurological:     Mental Status: She is alert.     GCS: GCS eye subscore is 4. GCS verbal subscore is 5. GCS motor subscore is 6.     Cranial Nerves: No facial asymmetry.     Sensory: Sensation is intact.     Motor: No weakness.     Coordination: Romberg sign negative. Finger-Nose-Finger Test normal.     Comments: Cranial nerves II through XII grossly intact patient patient had no diffuse word finding, no slurring of her words, able to follow two-step commands, no unilateral weakness present.  Psychiatric:        Mood and Affect: Mood normal.    ED Results / Procedures / Treatments   Labs (all labs ordered are listed, but only abnormal results are displayed) Labs Reviewed  COMPREHENSIVE METABOLIC PANEL - Abnormal; Notable for the following components:       Result Value   Sodium 130 (*)    Chloride 96 (*)    Glucose, Bld 1,008 (*)    BUN 26 (*)    Creatinine, Ser 1.37 (*)    Calcium 8.8 (*)    Albumin 2.7 (*)    AST 14 (*)    GFR, Estimated 55 (*)    All other components within normal limits  CBC WITH DIFFERENTIAL/PLATELET - Abnormal; Notable for the following components:   Neutro Abs 9.2 (*)    Lymphs Abs 0.6 (*)    All other components within normal limits  BRAIN NATRIURETIC PEPTIDE - Abnormal; Notable for the following components:   B Natriuretic Peptide 192.0 (*)    All other components within normal limits  BLOOD GAS, VENOUS - Abnormal; Notable for the following components:   pH, Ven 7.237 (*)    pCO2, Ven 61.8 (*)    All other components within normal limits  CBG MONITORING, ED - Abnormal; Notable for the following components:   Glucose-Capillary >600 (*)    All other components within normal limits  TROPONIN I (HIGH SENSITIVITY) - Abnormal; Notable for the following components:   Troponin I (High Sensitivity) 72 (*)    All other components within normal limits  CULTURE, BLOOD (ROUTINE X 2)  CULTURE, BLOOD (ROUTINE X 2)  RESP PANEL BY RT-PCR (FLU A&B, COVID) ARPGX2  LIPASE, BLOOD  URINALYSIS, ROUTINE W REFLEX MICROSCOPIC  PREGNANCY, URINE  BASIC METABOLIC PANEL  BASIC METABOLIC PANEL  BASIC METABOLIC PANEL  BASIC METABOLIC PANEL  BETA-HYDROXYBUTYRIC ACID  BETA-HYDROXYBUTYRIC ACID  URINALYSIS, COMPLETE (UACMP) WITH MICROSCOPIC  POC URINE PREG, ED  CBG MONITORING, ED  TROPONIN I (HIGH SENSITIVITY)    EKG EKG Interpretation  Date/Time:  Friday August 11 2021 12:57:55 EDT Ventricular  Rate:  115 PR Interval:  126 QRS Duration: 81 QT Interval:  322 QTC Calculation: 446 R Axis:   91 Text Interpretation: Sinus tachycardia Ventricular premature complex Confirmed by Octaviano Glow [phone]) on 08/11/2021 4:10:43 PM  Radiology DG Chest Port 1 View  Result Date: 08/11/2021 CLINICAL DATA:  Chest pain,  generalized edema throughout phase abdomen limbs. 25 year old female. EXAM: PORTABLE CHEST 1 VIEW COMPARISON:  August 06, 2021. FINDINGS: Trachea midline. Cardiomediastinal contours and hilar structures grossly stable. EKG leads project over the chest. Patchy basilar predominant opacities in the chest have developed since the previous study worse on the RIGHT than the LEFT. No sign of lobar consolidative process. No effusion on frontal radiograph. No sign of pneumothorax. On limited assessment no acute skeletal process. IMPRESSION: Patchy basilar predominant opacities in the chest have developed since the previous study. Suspicious for multifocal pneumonia. Electronically Signed   By: Zetta Bills M.D.   On: 08/11/2021 15:02    Procedures .Critical Care Performed by: Marcello Fennel, PA-C Authorized by: Marcello Fennel, PA-C   Critical care provider statement:    Critical care time (minutes):  45   Critical care time was exclusive of:  Separately billable procedures and treating other patients   Critical care was necessary to treat or prevent imminent or life-threatening deterioration of the following conditions:  Metabolic crisis   Critical care was time spent personally by me on the following activities:  Discussions with consultants, evaluation of patient's response to treatment, examination of patient, ordering and performing treatments and interventions, ordering and review of laboratory studies, ordering and review of radiographic studies, pulse oximetry, re-evaluation of patient's condition and review of old charts   I assumed direction of critical care for this patient from another provider in my specialty: no     Care discussed with: admitting provider     Medications Ordered in ED Medications  sodium chloride 0.9 % bolus 1,000 mL ( Intravenous Canceled Entry 08/11/21 1615)  insulin regular, human (MYXREDLIN) 100 units/ 100 mL infusion (has no administration in time range)   lactated ringers infusion (has no administration in time range)  dextrose 5 % in lactated ringers infusion (has no administration in time range)  dextrose 50 % solution 0-50 mL (has no administration in time range)  potassium chloride 10 mEq in 100 mL IVPB (has no administration in time range)  levofloxacin (LEVAQUIN) IVPB 750 mg (has no administration in time range)  lactated ringers bolus 1,344 mL (1,344 mLs Intravenous New Bag/Given 08/11/21 1643)    ED Course  I have reviewed the triage vital signs and the nursing notes.  Pertinent labs & imaging results that were available during my care of the patient were reviewed by me and considered in my medical decision making (see chart for details).    MDM Rules/Calculators/A&P                          Initial impression-patient presents with a multitude of complaints, she is alert, does not appear in acute stress, vital signs normal for tachycardia.  I suspect patient is in DKA, will obtain basic lab work-up, continue to monitor.  Work-up-CBC unremarkable, CMP shows slight hyponatremia of 130, hyperglycemia 1008, BUN of 26, creatinine 1.37, K calcium 1.88, GFR 55.  Lipase is 41.  Troponin 72, BNP 192, VBG shows a pH of 7.23 PCO2 of 61.  Chest x-ray reveals patchy bibasilar prominent OPC's in the chest have developed since  the previous study suspicion for multifocal pneumonia.  Reassessment-noted the patient is in DKA, will start her on the Endo tool, continue to monitor.  Noted on chest x-ray patient has pneumonia possible cause for her hyperglycemia, will start her on antibiotics.  Will recommend admission patient agreement this plan.  Will consult hospitalist team.  Patient is reassessed, continued no complaints this time, vital signs remained stable.  Consult spoke with Dr. Carles Collet who will admit the patient.   Rule out-I have low suspicion for CHF exacerbation as lung sounds are clear bilaterally, no signs of edema seen on chest x-ray, BNP  is 192.  I have low suspicion for ACS as EKG is without signs of ischemia.  Patient does has a elevated troponin of 72, patient currently has no chest him at this time, she has had chronic chest pain in the past, I suspect this is likely multifactorial dehydration as well as demand ischemia with heart rate in 114.  I have low suspicion for pancreatitis as lipase is within normal limits.  I have low suspicion for bowel obstruction as patient is passing gas and having normal bowel movements, abdomen is dull to percussion.  Low suspicion for liver or gallbladder abnormality as she has no right upper quadrant tenderness, liver enzymes alk phos all within normal limits.  Low suspicion for complicated diverticulitis as patient is nontoxic-appearing, afebrile, no leukocytosis present on exam.  Patient does have abdominal pain with nausea and vomiting likely this is a sequela from being in DKA.  I have low suspicion for CVA or intracranial head bleed as patient denies recent head trauma, not on anticoagulant, there is no focal deficits present my exam.  She does endorse visual changes but I suspect this is likely due to the elevated glucose level.  Plan-admit patient due to DKA likely from CAP as well as noncompliance with her medication.  Final Clinical Impression(s) / ED Diagnoses Final diagnoses:  Diabetic ketoacidosis without coma associated with type 1 diabetes mellitus ([place])  Community acquired pneumonia, unspecified laterality  Elevated troponin    Rx / DC Orders ED Discharge Orders     None        Marcello Fennel, PA-C 08/11/21 1733    Milton Ferguson, MD 08/12/21 1327

## 2021-08-11 NOTE — ED Notes (Signed)
ED TO INPATIENT HANDOFF REPORT  ED Nurse Name and Phone #:  [phone]  S Name/Age/Gender Diamond [name] 25 y.o. female Room/Bed: APA19/APA19  Code Status   Code Status: Prior  Home/SNF/Other Home Patient oriented to: self, place, time, and situation Is this baseline? Yes   Triage Complete: Triage complete  Chief Complaint DKA, type 1 ([place]) [E10.10]  Triage Note Pt states last night, got up and sugar was low. Drank some juice and since then felt n/v, sob, and cp    Allergies Allergies  Allergen Reactions   Amoxicillin Hives    Did it involve swelling of the face/tongue/throat, SOB, or low BP? no  Did it involve sudden or severe rash/hives, skin peeling, or any reaction on the inside of your mouth or nose? no Did you need to seek medical attention at a hospital or doctor's office? no When did it last happen?   unk    If all above answers are "NO", may proceed with cephalosporin use.   Dexamethasone Hives and Itching   Doxycycline Itching and Swelling   Penicillins Hives and Itching    Did it involve swelling of the face/tongue/throat, SOB, or low BP? no  Did it involve sudden or severe rash/hives, skin peeling, or any reaction on the inside of your mouth or nose? no Did you need to seek medical attention at a hospital or doctor's office? no When did it last happen?   unk    If all above answers are "NO", may proceed with cephalosporin use. Did it involve swelling of the face/tongue/throat, SOB, or low BP? no  Did it involve sudden or severe rash/hives, skin peeling, or any reac   Ceftriaxone Sodium In Dextrose Itching   Augmentin [Amoxicillin-Pot Clavulanate] Other (See Comments)    Reaction unknown   Clavulanic Acid    Adhesive [Tape] Itching and Rash   Latex Itching and Rash   Other Itching and Rash   Pineapple Rash    Rash on tongue and throat    Level of Care/Admitting Diagnosis ED Disposition     ED Disposition  Admit   Condition  --   [place]: [facility] [[id number]]  Level of Care: Stepdown [14]  Covid Evaluation: Asymptomatic Screening Protocol (No Symptoms)  Diagnosis: DKA, type 1 ([place]) [[id number]]  Admitting Physician: TAT, DAVID Juvencus.Bun  Attending Physician: TAT, DAVID Juvencus.Bun  Estimated length of stay: past midnight tomorrow  Certification:: I certify this patient will need inpatient services for at least 2 midnights          B Medical/Surgery History Past Medical History:  Diagnosis Date   ADHD (attention deficit hyperactivity disorder) 2007   Allergy    Arm DVT (deep venous thromboembolism), acute, right ([place]) 04/07/2021   Asthma    Bipolar disorder ([place])    Crohn's colitis ([place])    Dx Dr. Cherrie Gauze   Diabetic nephropathy [facility])    Discitis of lumbar region 05/29/2021   Edema    Eosinophilic esophagitis    GERD (gastroesophageal reflux disease)    Hypertension    MSSA bacteremia 04/07/2021   Nephrotic syndrome    Pancreatitis    Panic attacks    PTSD (post-traumatic stress disorder)    Stroke ([place]) 07/2020   R side   Type 1 diabetes ([place]) 2008   Past Surgical History:  Procedure Laterality Date   ADENOIDECTOMY     BIOPSY  02/01/2021   Procedure: BIOPSY;  Surgeon: Rogene Houston, MD;  [facility];  Service: Endoscopy;;  duodenal, esophageal,   BIOPSY  02/03/2021   Procedure: BIOPSY;  Surgeon: Eloise Harman, DO;  [facility];  Service: Endoscopy;;   COLONOSCOPY WITH PROPOFOL N/A 02/01/2021   Procedure: COLONOSCOPY WITH PROPOFOL;  Surgeon: Rogene Houston, MD;  [facility];  Service: Endoscopy;  Laterality: N/A;   COLONOSCOPY WITH PROPOFOL N/A 02/03/2021   Procedure: COLONOSCOPY WITH PROPOFOL;  Surgeon: Eloise Harman, DO;  [facility];  Service: Endoscopy;  Laterality: N/A;   ESOPHAGOGASTRODUODENOSCOPY (EGD) WITH PROPOFOL N/A 02/01/2021   Procedure: ESOPHAGOGASTRODUODENOSCOPY (EGD) WITH PROPOFOL;  Surgeon: Rogene Houston, MD;   [facility];  Service: Endoscopy;  Laterality: N/A;   HERNIA REPAIR     TEE WITHOUT CARDIOVERSION N/A 03/09/2021   Procedure: TRANSESOPHAGEAL ECHOCARDIOGRAM (TEE) WITH PROPOFOL;  Surgeon: Arnoldo Lenis, MD;  [facility];  Service: Endoscopy;  Laterality: N/A;   TONSILLECTOMY       A IV Location/Drains/Wounds Patient Lines/Drains/Airways Status     Active Line/Drains/Airways     Name Placement date Placement time Site Days   Peripheral IV 08/11/21 Anterior;Right Hand 08/11/21  1257  Hand  less than 1   Peripheral IV 08/11/21 20 G 1.88" Anterior;Left Forearm 08/11/21  1730  Forearm  less than 1   Peripheral IV 08/11/21 20 G 1.88" Anterior;Right Forearm 08/11/21  1735  Forearm  less than 1   Incision (Closed) 03/07/21 Back Left 03/07/21  1150  -- 157            Intake/Output Last 24 hours No intake or output data in the 24 hours ending 08/11/21 1801  Labs/Imaging Results for orders placed or performed during the hospital encounter of 08/11/21 (from the past 48 hour(s))  CBG monitoring, ED     Status: Abnormal   Collection Time: 08/11/21 12:55 PM  Result Value Ref Range   Glucose-Capillary >600 (HH) 70 - 99 mg/dL    Comment: Glucose reference range applies only to samples taken after fasting for at least 8 hours.  Comprehensive metabolic panel     Status: Abnormal   Collection Time: 08/11/21  2:51 PM  Result Value Ref Range   Sodium 130 (L) 135 - 145 mmol/L   Potassium 4.8 3.5 - 5.1 mmol/L   Chloride 96 (L) 98 - 111 mmol/L   CO2 24 22 - 32 mmol/L   Glucose, Bld 1,008 (HH) 70 - 99 mg/dL    Comment: Glucose reference range applies only to samples taken after fasting for at least 8 hours. CRITICAL RESULT CALLED TO, READ BACK BY AND VERIFIED WITH: HOLCOMB,R ON 08/11/21 AT 1600 BY LOY,C    BUN 26 (H) 6 - 20 mg/dL   Creatinine, Ser 1.37 (H) 0.44 - 1.00 mg/dL   Calcium 8.8 (L) 8.9 - 10.3 mg/dL   Total Protein 7.1 6.5 - 8.1 g/dL   Albumin 2.7 (L) 3.5 -  5.0 g/dL   AST 14 (L) 15 - 41 U/L   ALT 16 0 - 44 U/L   Alkaline Phosphatase 109 38 - 126 U/L   Total Bilirubin 1.0 0.3 - 1.2 mg/dL   GFR, Estimated 55 (L) >60 mL/min    Comment: (NOTE) Calculated using the CKD-EPI Creatinine Equation (2021)    Anion gap 10 5 - 15    Comment: Performed at [facility], [address], [place], [place] [address]  Lipase, blood     Status: None  Collection Time: 08/11/21  2:51 PM  Result Value Ref Range   Lipase 41 11 - 51 U/L    Comment: Performed at [facility], [address] [place], [place] [address]  Troponin I (High Sensitivity)     Status: Abnormal   Collection Time: 08/11/21  2:51 PM  Result Value Ref Range   Troponin I (High Sensitivity) 72 (H) <18 ng/L    Comment: (NOTE) Elevated high sensitivity troponin I (hsTnI) values and significant  changes across serial measurements may suggest ACS but many other  chronic and acute conditions are known to elevate hsTnI results.  Refer to the Links section for chest pain algorithms and additional  guidance. Performed at [facility], [address], [place], [place] [address]   CBC with Differential     Status: Abnormal   Collection Time: 08/11/21  2:51 PM  Result Value Ref Range   WBC 10.3 4.0 - 10.5 K/uL   RBC 4.90 3.87 - 5.11 MIL/uL   Hemoglobin 14.7 12.0 - 15.0 g/dL   HCT 45.8 36.0 - 46.0 %   MCV 93.5 80.0 - 100.0 fL   MCH 30.0 26.0 - 34.0 pg   MCHC 32.1 30.0 - 36.0 g/dL   RDW 12.6 11.5 - 15.5 %   Platelets 289 150 - 400 K/uL   nRBC 0.0 0.0 - 0.2 %   Neutrophils Relative % 74 %   Neutro Abs 9.2 (H) 1.7 - 7.7 K/uL   Band Neutrophils 15 %   Lymphocytes Relative 6 %   Lymphs Abs 0.6 (L) 0.7 - 4.0 K/uL   Monocytes Relative 1 %   Monocytes Absolute 0.1 0.1 - 1.0 K/uL   Eosinophils Relative 0 %   Eosinophils Absolute 0.0 0.0 - 0.5 K/uL   Basophils Relative 0 %   Basophils Absolute 0.0 0.0 - 0.1 K/uL   WBC Morphology MILD LEFT SHIFT (1-5% METAS, OCC MYELO, OCC BANDS)     Comment: TOXIC  GRANULATION VACUOLATED NEUTROPHILS    Metamyelocytes Relative 4 %    Comment: Performed at [facility], [address], [place], [place] [address]  Brain natriuretic peptide     Status: Abnormal   Collection Time: 08/11/21  2:51 PM  Result Value Ref Range   B Natriuretic Peptide 192.0 (H) 0.0 - 100.0 pg/mL    Comment: Performed at [facility], [address] [place], [place] [address]  Blood gas, venous (at [facility] and [facility], not at [facility])     Status: Abnormal   Collection Time: 08/11/21  2:51 PM  Result Value Ref Range   FIO2 36.00    pH, Ven 7.237 (L) 7.250 - 7.430   pCO2, Ven 61.8 (H) 44.0 - 60.0 mmHg   pO2, Ven 33.5 32.0 - 45.0 mmHg   Bicarbonate 20.5 20.0 - 28.0 mmol/L   Acid-base deficit 1.2 0.0 - 2.0 mmol/L   O2 Saturation 48.3 %   Patient temperature 37.6     Comment: Performed at [facility], [address], [place],  [address]  CBG monitoring, ED     Status: Abnormal   Collection Time: 08/11/21  5:42 PM  Result Value Ref Range   Glucose-Capillary >600 (HH) 70 - 99 mg/dL    Comment: Glucose reference range applies only to samples taken after fasting for at least 8 hours.   DG Chest Port 1 View  Result Date: 08/11/2021 CLINICAL DATA:  Chest pain, generalized edema throughout phase abdomen limbs. 25 year old female. EXAM: PORTABLE CHEST 1 VIEW COMPARISON:  August 06, 2021. FINDINGS: Trachea midline. Cardiomediastinal contours and hilar structures grossly stable. EKG leads project over the chest. Patchy basilar predominant opacities in the chest have developed since the previous study worse on the RIGHT than the LEFT. No sign of lobar consolidative process. No effusion on frontal radiograph. No sign of pneumothorax. On limited assessment no acute skeletal process. IMPRESSION: Patchy basilar predominant opacities in the chest have developed since the previous study. Suspicious for multifocal pneumonia. Electronically Signed   By: Zetta Bills M.D.   On: 08/11/2021 15:02     Pending Labs Unresulted Labs (From admission, onward)     Start     Ordered   08/11/21 1740  Urine rapid drug screen (hosp performed)  ONCE - STAT,   STAT        08/11/21 1740   08/11/21 1707  Urinalysis, Complete w Microscopic  Once,   STAT        08/11/21 1706   08/11/21 1613  Resp Panel by RT-PCR (Flu A&B, Covid) Nasopharyngeal Swab  (Tier 2 - Symptomatic/asymptomatic with Precautions )  Once,   STAT       Question Answer Comment  Is this test for diagnosis or screening Screening   Symptomatic for COVID-19 as defined by CDC No   Hospitalized for COVID-19 No   Admitted to ICU for COVID-19 No   Previously tested for COVID-19 Yes   Resident in a congregate (group) care setting No   Employed in healthcare setting No   Pregnant No   Has patient completed COVID vaccination(s) (2 doses of Pfizer/Moderna 1 dose of [employer]) No      08/11/21 1612   08/11/21 1612  Blood culture (routine x 2)  BLOOD CULTURE X 2,   STAT      08/11/21 1611   08/11/21 1610  Basic metabolic panel  (Diabetes Ketoacidosis (DKA))  STAT Now then every 4 hours ,   STAT      08/11/21 1606   08/11/21 1607  Beta-hydroxybutyric acid  (Diabetes Ketoacidosis (DKA))  Now then every 8 hours,   STAT      08/11/21 1606   08/11/21 1401  Urinalysis, Routine w reflex microscopic  Once,   STAT        08/11/21 1402   08/11/21 1401  Pregnancy, urine  ONCE - STAT,   STAT        08/11/21 1402   Signed and Held  Osmolality  (Hyperglycemic Hyperosmolar State (HHS))  Once,   R        Signed and Held   Signed and Held  Hemoglobin A1c  (Hyperglycemic Hyperosmolar State (HHS))  Once,   R       Comments: To assess prior glycemic control.    Signed and Held   Signed and Held  D-dimer, quantitative  Once,   R        Signed and Held   Signed and Held  Procalcitonin - Baseline  ONCE - STAT,   STAT        Signed and Held            Vitals/Pain Today's Vitals   08/11/21 1308 08/11/21 1330 08/11/21 1400 08/11/21 1430   BP:  (!) 139/100 (!) 146/109 (!) 149/114  Pulse:  (!) 110 (!) 113 (!) 114  Resp:  13 (!) 0 (!) 9  Temp:      TempSrc:      SpO2:  98% 94% 100%  Weight: 148 lb  3.2 oz (67.2 kg)     Height: 4' 11"  (1.499 m)     PainSc:        Isolation Precautions Airborne and Contact precautions  Medications Medications  sodium chloride 0.9 % bolus 1,000 mL ( Intravenous Canceled Entry 08/11/21 1615)  insulin regular, human (MYXREDLIN) 100 units/ 100 mL infusion (8 Units/hr Intravenous New Bag/Given 08/11/21 1745)  lactated ringers infusion (has no administration in time range)  dextrose 5 % in lactated ringers infusion (has no administration in time range)  dextrose 50 % solution 0-50 mL (has no administration in time range)  potassium chloride 10 mEq in 100 mL IVPB (10 mEq Intravenous New Bag/Given 08/11/21 1738)  levofloxacin (LEVAQUIN) IVPB 750 mg (has no administration in time range)  lactated ringers bolus 1,344 mL (1,344 mLs Intravenous New Bag/Given 08/11/21 1643)    Mobility walks Low fall risk   Focused Assessments   R Recommendations: See Admitting Provider Note  Report given to:   Additional Notes:

## 2021-08-11 NOTE — ED Notes (Signed)
Pt was up in room walking around stating she was trying to clean self up. IV line no longer useable and states it stings when used. Unable to get blood cultures or new Line due to difficulty obtaining access.

## 2021-08-11 NOTE — H&P (Signed)
History and Physical  Diamond [name] DOB: [birth year] DOA: 08/11/2021   PCP: [name], PA-C   Patient coming from: Home  Chief Complaint: n/v, sob  HPI:  Diamond [name] is a 25 y.o. female with medical history of  DM type 1, bipolar disorder, anasarca, Crohn's Disease, and hypertension, MSSA bactermia, nephrotic syndrome presenting nausea and vomiting that began on the evening of 08/10/2021.  The patient checked her blood sugar at that time, and she stated that it was in the 60s.  She drank some orange juice and subsequently developed abdominal pain with nausea and vomiting.  She felt like she would just sleep it off.  However when she woke up the morning she continued to have some nausea and abdominal pain.  She continued to have nausea and vomiting.  She developed chest pain or shortness of breath on the morning of 08/11/2021.  She also developed blurry vision bilateral eyes.  As result, the patient presented for further evaluation.  She endorses compliance with all her medications including her insulin however review of the medical record shows that the patient has a long history of poor compliance. She denies any fevers, chills, headache, neck pain, diarrhea, hematochezia, melena. In the emergency department, the patient had low-grade temperature of 99.7 F.  She was tachycardic up to 115.  She was hemodynamically stable.  Oxygen saturation was 86% on room air.  She was placed on 4 L with saturation up to 96%.  BMP shows sodium 130, potassium 4.8, chloride 96, CO2 94, anion gap 10.  Serum glucose was 1008.  Serum creatinine 1.37.  LFTs were unremarkable.  Lipase 41.  WBC 10.3, hemoglobin 14.7 platelets 289,000.  Initial troponin was 72.  EKG shows sinus tachycardia with nonspecific T wave changes which were unchanged from previous EKGs.  BNP was 192.  Chest x-ray showed bibasilar opacities, right greater than left.  The patient was started on  levofloxacin.  Assessment/Plan: Hyperosmolar state -patient started on IV insulin with q 1 hour CBG check and q 4 hour BMPs -pt started on aggressive fluid resuscitation -Electrolytes were monitored and repleted -transitioned to 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/nephrotic syndrome -Urine protein creatinine ratio of 6.55 on 07/13/2021  Atypical chest pain/elevated troponin -Cycle troponins -Echocardiogram -Personally reviewed EKG--sinus tachycardia, nonspecific T wave changes -Check D-dimer  Anasarca/nephrotic syndrome -07/13/2021 urine protein creatinine ratio 6.55 -There was concerns about possible allergy to lisinopril which was discontinued  Bipolar disorder -Previously taking trazodone and Celexa -It is unclear whether the patient is still taking these  Hyponatremia -Secondary to hyperglycemia -resolved  Essential hypertension -Start IV Lopressor and lieu of her oral metoprolol as the patient is not tolerating oral intake presently        Past Medical History:  Diagnosis Date   ADHD (attention deficit hyperactivity disorder) 2007   Allergy    Arm DVT (deep venous thromboembolism), acute, right ([place]) 04/07/2021   Asthma    Bipolar disorder ([place])    Crohn's colitis ([place])    Dx Dr. Cherrie Gauze   Diabetic nephropathy [facility])    Discitis of lumbar region 05/29/2021   Edema    Eosinophilic esophagitis    GERD (gastroesophageal reflux disease)    Hypertension  MSSA bacteremia 04/07/2021   Nephrotic syndrome    Pancreatitis    Panic attacks    PTSD (post-traumatic stress disorder)    Stroke ([place]) 07/2020   R side   Type 1 diabetes ([place]) 2008   Past Surgical History:  Procedure  Laterality Date   ADENOIDECTOMY     BIOPSY  02/01/2021   Procedure: BIOPSY;  Surgeon: Rogene Houston, MD;  [facility];  Service: Endoscopy;;  duodenal, esophageal,   BIOPSY  02/03/2021   Procedure: BIOPSY;  Surgeon: Eloise Harman, DO;  Location: [facility];  Service: Endoscopy;;   COLONOSCOPY WITH PROPOFOL N/A 02/01/2021   Procedure: COLONOSCOPY WITH PROPOFOL;  Surgeon: Rogene Houston, MD;  [facility];  Service: Endoscopy;  Laterality: N/A;   COLONOSCOPY WITH PROPOFOL N/A 02/03/2021   Procedure: COLONOSCOPY WITH PROPOFOL;  Surgeon: Eloise Harman, DO;  [facility];  Service: Endoscopy;  Laterality: N/A;   ESOPHAGOGASTRODUODENOSCOPY (EGD) WITH PROPOFOL N/A 02/01/2021   Procedure: ESOPHAGOGASTRODUODENOSCOPY (EGD) WITH PROPOFOL;  Surgeon: Rogene Houston, MD;  [facility];  Service: Endoscopy;  Laterality: N/A;   HERNIA REPAIR     TEE WITHOUT CARDIOVERSION N/A 03/09/2021   Procedure: TRANSESOPHAGEAL ECHOCARDIOGRAM (TEE) WITH PROPOFOL;  Surgeon: Arnoldo Lenis, MD;  Location: [facility];  Service: Endoscopy;  Laterality: N/A;   TONSILLECTOMY     Social History:  reports that she has never smoked. She has never used smokeless tobacco. She reports that she does not drink alcohol and does not use drugs.   Family History  Problem Relation Age of Onset   Hypertension Mother    Diabetes Mother    Heart disease Mother    Crohn's disease Father    Alcohol abuse Father    Drug abuse Father    Mental illness Father    Cancer Father    Hypertension Maternal Aunt    Seizures Maternal Aunt    Depression Maternal Aunt    Asthma Maternal Uncle    Hypertension Maternal Uncle    Cancer Maternal Grandmother    Diabetes Maternal Grandmother    Mental illness Maternal Grandfather      Allergies  Allergen Reactions   Amoxicillin Hives    Did it involve swelling of the face/tongue/throat, SOB, or low BP? no  Did it involve sudden or  severe rash/hives, skin peeling, or any reaction on the inside of your mouth or nose? no Did you need to seek medical attention at a hospital or doctor's office? no When did it last happen?   unk    If all above answers are "NO", may proceed with cephalosporin use.   Dexamethasone Hives and Itching   Doxycycline Itching and Swelling   Penicillins Hives and Itching    Did it involve swelling of the face/tongue/throat, SOB, or low BP? no  Did it involve sudden or severe rash/hives, skin peeling, or any reaction on the inside of your mouth or nose? no Did you need to seek medical attention at a hospital or doctor's office? no When did it last happen?   unk    If all above answers are "NO", may proceed with cephalosporin use. Did it involve swelling of the face/tongue/throat, SOB, or low BP? no  Did it involve sudden or severe rash/hives, skin peeling, or any reac   Ceftriaxone Sodium In Dextrose Itching   Augmentin [Amoxicillin-Pot Clavulanate] Other (See Comments)    Reaction unknown   Clavulanic Acid  Adhesive [Tape] Itching and Rash   Latex Itching and Rash   Other Itching and Rash   Pineapple Rash    Rash on tongue and throat     Prior to Admission medications   Medication Sig Start Date End Date Taking? Authorizing Provider  acetaminophen (TYLENOL) 500 MG tablet Take by mouth. 04/21/20  Yes [provider]  albuterol (VENTOLIN HFA) 108 (90 Base) MCG/ACT inhaler Inhale 2 puffs into the lungs every 6 (six) hours as needed for wheezing or shortness of breath. For shortness of breath 03/10/21  Yes Emokpae, Courage, MD  furosemide (LASIX) 20 MG tablet Take 60 mg by mouth daily. 04/25/20  Yes [provider]  gabapentin (NEURONTIN) 100 MG capsule Take 1 capsule (100 mg total) by mouth 2 (two) times daily. 03/10/21  Yes Emokpae, Courage, MD  hydrALAZINE (APRESOLINE) 25 MG tablet Take 1 tablet (25 mg total) by mouth 2 (two) times daily. 03/10/21  Yes Emokpae, Courage, MD   hydrochlorothiazide (HYDRODIURIL) 25 MG tablet Take 25 mg by mouth daily. 02/11/20  Yes [provider]  insulin glargine (LANTUS) 100 UNIT/ML injection Inject 40 Units into the skin at bedtime.   Yes [provider]  Insulin Glulisine (APIDRA [place]) Inject 10-16 Units into the skin 3 (three) times daily before meals. ssi   Yes [provider]  isosorbide mononitrate (IMDUR) 30 MG 24 hr tablet Take 0.5 tablets (15 mg total) by mouth daily. Patient taking differently: Take 30 mg by mouth daily. 03/19/21  Yes Barton Dubois, MD  metoCLOPramide (REGLAN) 5 MG tablet Take 1 tablet (5 mg total) by mouth every 8 (eight) hours as needed for refractory nausea / vomiting (refractory symptoms). 03/19/21 03/19/22 Yes Barton Dubois, MD  metoprolol tartrate (LOPRESSOR) 50 MG tablet Take 1 tablet (50 mg total) by mouth 2 (two) times daily. 03/23/21  Yes [name], PA-C  omeprazole (PRILOSEC) 40 MG capsule Take 1 capsule (40 mg total) by mouth in the morning and at bedtime. 03/23/21  Yes [name], PA-C  potassium chloride (KLOR-CON) 10 MEQ tablet Take 10 mEq by mouth daily. 10/21/20  Yes [provider]  promethazine (PHENERGAN) 25 MG tablet Take 1 tablet (25 mg total) by mouth every 8 (eight) hours as needed for nausea or vomiting. 04/18/21  Yes [name], PA-C  loperamide (IMODIUM A-D) 2 MG tablet Take 1 tablet (2 mg total) by mouth 4 (four) times daily as needed for diarrhea or loose stools. Patient not taking: No sig reported 04/02/21   Manuella Ghazi, Pratik D, DO  ondansetron (ZOFRAN ODT) 8 MG disintegrating tablet Take 1 tablet (8 mg total) by mouth every 8 (eight) hours as needed for nausea or vomiting. Patient not taking: No sig reported 03/10/21   Roxan Hockey, MD    Review of Systems:  Constitutional:  No weight loss, night sweats, Fevers, chills, fatigue.  Head&Eyes: No headache.  No vision loss.  No eye pain or scotoma ENT:  No Difficulty  swallowing,Tooth/dental problems,Sore throat,  No ear ache, post nasal drip,  Cardio-vascular:  No  PND, swelling in lower extremities,  dizziness, palpitations  GI:  No  diarrhea, loss of appetite, hematochezia, melena, heartburn, indigestion, Resp:  No coughing up of blood .No wheezing.No chest wall deformity  Skin:  no rash or lesions.  GU:  no dysuria, change in color of urine, no urgency or frequency. No flank pain.  Musculoskeletal:  No joint pain or swelling. No decreased range of motion. No back pain.  Psych:  No change in mood or affect. No depression or anxiety. Neurologic: No headache, no dysesthesia, no focal weakness, no vision loss. No syncope  Physical Exam: Vitals:   08/11/21 1308 08/11/21 1330 08/11/21 1400 08/11/21 1430  BP:  (!) 139/100 (!) 146/109 (!) 149/114  Pulse:  (!) 110 (!) 113 (!) 114  Resp:  13 (!) 0 (!) 9  Temp:      TempSrc:      SpO2:  98% 94% 100%  Weight: 67.2 kg     Height: 4' 11"  (1.499 m)      General:  A&O x 3, NAD, nontoxic, pleasant/cooperative Head/Eye: No conjunctival hemorrhage, no icterus, [place]/AT, No nystagmus ENT:  No icterus,  No thrush, good dentition, no pharyngeal exudate Neck:  No masses, no lymphadenpathy, no bruits CV:  RRR, no rub, no gallop, no S3 Lung:  bibasilar rales. No wheeze Abdomen: soft/NT, +BS, nondistended, no peritoneal signs Ext: No cyanosis, No rashes, No petechiae, No lymphangitis, 1+ LE edema Neuro: CNII-XII intact, strength 4/5 in bilateral upper and lower extremities, no dysmetria  Labs on Admission:  Basic Metabolic Panel: Recent Labs  Lab 08/11/21 1451  NA 130*  K 4.8  CL 96*  CO2 24  GLUCOSE 1,008*  BUN 26*  CREATININE 1.37*  CALCIUM 8.8*   Liver Function Tests: Recent Labs  Lab 08/11/21 1451  AST 14*  ALT 16  ALKPHOS 109  BILITOT 1.0  PROT 7.1  ALBUMIN 2.7*   Recent Labs  Lab 08/11/21 1451  LIPASE 41   No results for input(s): AMMONIA in the last 168 hours. CBC: Recent  Labs  Lab 08/11/21 1451  WBC 10.3  NEUTROABS 9.2*  HGB 14.7  HCT 45.8  MCV 93.5  PLT 289   Coagulation Profile: No results for input(s): INR, PROTIME in the last 168 hours. Cardiac Enzymes: No results for input(s): CKTOTAL, CKMB, CKMBINDEX, TROPONINI in the last 168 hours. BNP: Invalid input(s): POCBNP CBG: Recent Labs  Lab 08/11/21 1255  GLUCAP >600*   Urine analysis:    Component Value Date/Time   COLORURINE STRAW (A) 03/31/2021 1905   APPEARANCEUR HAZY (A) 03/31/2021 1905   APPEARANCEUR Clear 11/15/2020 1531   LABSPEC 1.011 03/31/2021 1905   PHURINE 7.0 03/31/2021 1905   GLUCOSEU >=500 (A) 03/31/2021 1905   HGBUR NEGATIVE 03/31/2021 1905   BILIRUBINUR NEGATIVE 03/31/2021 1905   BILIRUBINUR NEG 12/05/2020 1353   BILIRUBINUR Negative 11/15/2020 [place] 03/31/2021 1905   PROTEINUR >=300 (A) 03/31/2021 1905   UROBILINOGEN 0.2 12/05/2020 1353   UROBILINOGEN 0.2 09/13/2012 2014   NITRITE NEGATIVE 03/31/2021 1905   LEUKOCYTESUR TRACE (A) 03/31/2021 1905   Sepsis Labs: @LABRCNTIP (procalcitonin:4,lacticidven:4) )No results found for this or any previous visit (from the past 240 hour(s)).   Radiological Exams on Admission: DG Chest Port 1 View  Result Date: 08/11/2021 CLINICAL DATA:  Chest pain, generalized edema throughout phase abdomen limbs. 25 year old female. EXAM: PORTABLE CHEST 1 VIEW COMPARISON:  August 06, 2021. FINDINGS: Trachea midline. Cardiomediastinal contours and hilar structures grossly stable. EKG leads project over the chest. Patchy basilar predominant opacities in the chest have developed since the previous study worse on the RIGHT than the LEFT. No sign of lobar consolidative process. No effusion on frontal radiograph. No sign of pneumothorax. On limited assessment no acute skeletal process. IMPRESSION: Patchy basilar predominant opacities in the chest have developed since the previous study. Suspicious for multifocal pneumonia.  Electronically Signed   By: Zetta Bills M.D.   On:  08/11/2021 15:02    EKG: Independently reviewed. Sinus tachycardia, nonspecific T wave change    Time spent:60 minutes Code Status:   FULL Family Communication:  No Family at bedside Disposition Plan: expect 2 day hospitalization Consults called: none DVT Prophylaxis: [place] Lovenox  Orson Eva, DO  [facility] Pager [phone]  If 7PM-7AM, please contact night-coverage [email or web address] Password [email or web address] 08/11/2021, 5:22 PM

## 2021-08-11 NOTE — ED Notes (Signed)
Dr. Carles Collet notified of difficulty obtaining a new line. ED nurse attempted ultrasound IV x2 with no success. SWOT RN at bedside attempting access with ultrasound at this time

## 2021-08-12 ENCOUNTER — Inpatient Hospital Stay (HOSPITAL_COMMUNITY): Payer: Medicaid Other

## 2021-08-12 ENCOUNTER — Inpatient Hospital Stay (HOSPITAL_COMMUNITY): Payer: Self-pay

## 2021-08-12 DIAGNOSIS — R079 Chest pain, unspecified: Secondary | ICD-10-CM

## 2021-08-12 DIAGNOSIS — E1010 Type 1 diabetes mellitus with ketoacidosis without coma: Principal | ICD-10-CM

## 2021-08-12 DIAGNOSIS — N179 Acute kidney failure, unspecified: Secondary | ICD-10-CM

## 2021-08-12 LAB — BASIC METABOLIC PANEL
Anion gap: 6 (ref 5–15)
Anion gap: 3 — ABNORMAL LOW (ref 5–15)
Anion gap: 5 (ref 5–15)
BUN: 19 mg/dL (ref 6–20)
BUN: 20 mg/dL (ref 6–20)
BUN: 18 mg/dL (ref 6–20)
CO2: 26 mmol/L (ref 22–32)
CO2: 26 mmol/L (ref 22–32)
CO2: 27 mmol/L (ref 22–32)
Calcium: 8.2 mg/dL — ABNORMAL LOW (ref 8.9–10.3)
Calcium: 7.9 mg/dL — ABNORMAL LOW (ref 8.9–10.3)
Calcium: 8.3 mg/dL — ABNORMAL LOW (ref 8.9–10.3)
Chloride: 103 mmol/L (ref 98–111)
Chloride: 104 mmol/L (ref 98–111)
Chloride: 104 mmol/L (ref 98–111)
Creatinine, Ser: 1.11 mg/dL — ABNORMAL HIGH (ref 0.44–1.00)
Creatinine, Ser: 1.08 mg/dL — ABNORMAL HIGH (ref 0.44–1.00)
Creatinine, Ser: 1.12 mg/dL — ABNORMAL HIGH (ref 0.44–1.00)
GFR, Estimated: >60 mL/min (ref >60)
GFR, Estimated: >60 mL/min (ref >60)
GFR, Estimated: >60 mL/min (ref >60)
Glucose, Bld: 216 mg/dL — ABNORMAL HIGH (ref 70–99)
Glucose, Bld: 127 mg/dL — ABNORMAL HIGH (ref 70–99)
Glucose, Bld: 99 mg/dL (ref 70–99)
Potassium: 4 mmol/L (ref 3.5–5.1)
Potassium: 4 mmol/L (ref 3.5–5.1)
Potassium: 4 mmol/L (ref 3.5–5.1)
Sodium: 135 mmol/L (ref 135–145)
Sodium: 133 mmol/L — ABNORMAL LOW (ref 135–145)
Sodium: 136 mmol/L (ref 135–145)

## 2021-08-12 LAB — BLOOD CULTURE ID PANEL (REFLEXED) - BCID2

## 2021-08-12 LAB — BETA-HYDROXYBUTYRIC ACID
Beta-Hydroxybutyric Acid: 0.08 mmol/L (ref 0.05–0.27)
Beta-Hydroxybutyric Acid: 0.07 mmol/L (ref 0.05–0.27)

## 2021-08-12 LAB — ECHOCARDIOGRAM COMPLETE
AV Mean grad: 3 mmHg
AV Peak grad: 3.5 mmHg
Ao pk vel: 0.94 m/s
Area-P 1/2: 6.12 cm2
Height: 59 in
S' Lateral: 2.15 cm
Weight: 2285.73 oz

## 2021-08-12 LAB — GLUCOSE, CAPILLARY
Glucose-Capillary: 116 mg/dL — ABNORMAL HIGH (ref 70–99)
Glucose-Capillary: 126 mg/dL — ABNORMAL HIGH (ref 70–99)
Glucose-Capillary: 132 mg/dL — ABNORMAL HIGH (ref 70–99)
Glucose-Capillary: 150 mg/dL — ABNORMAL HIGH (ref 70–99)
Glucose-Capillary: 153 mg/dL — ABNORMAL HIGH (ref 70–99)
Glucose-Capillary: 159 mg/dL — ABNORMAL HIGH (ref 70–99)
Glucose-Capillary: 192 mg/dL — ABNORMAL HIGH (ref 70–99)
Glucose-Capillary: 193 mg/dL — ABNORMAL HIGH (ref 70–99)
Glucose-Capillary: 196 mg/dL — ABNORMAL HIGH (ref 70–99)
Glucose-Capillary: 250 mg/dL — ABNORMAL HIGH (ref 70–99)
Glucose-Capillary: 56 mg/dL — ABNORMAL LOW (ref 70–99)

## 2021-08-12 LAB — TROPONIN I (HIGH SENSITIVITY)
Troponin I (High Sensitivity): 125 ng/L (ref <18)
Troponin I (High Sensitivity): 110 ng/L (ref <18)

## 2021-08-12 LAB — OSMOLALITY: Osmolality: 328 mOsm/kg (ref 275–295)

## 2021-08-12 MED ORDER — INSULIN GLARGINE-YFGN 100 UNIT/ML ~~LOC~~ SOLN
10.0000 [IU] | Freq: Every day | SUBCUTANEOUS | Status: DC
  Administered 2021-08-12: 10 [IU] via SUBCUTANEOUS
  Filled 2021-08-12 (×2): qty 0.1

## 2021-08-12 MED ORDER — PROCHLORPERAZINE EDISYLATE 10 MG/2ML IJ SOLN
10.0000 mg | Freq: Four times a day (QID) | INTRAMUSCULAR | Status: DC | PRN
  Administered 2021-08-12 – 2021-08-15 (×3): 10 mg via INTRAVENOUS
  Filled 2021-08-12 (×3): qty 2

## 2021-08-12 MED ORDER — INSULIN ASPART 100 UNIT/ML IJ SOLN
0.0000 [IU] | Freq: Three times a day (TID) | INTRAMUSCULAR | Status: DC
  Administered 2021-08-12: 2 [IU] via SUBCUTANEOUS
  Administered 2021-08-13: 8 [IU] via SUBCUTANEOUS

## 2021-08-12 MED ORDER — FUROSEMIDE 10 MG/ML IJ SOLN
40.0000 mg | Freq: Once | INTRAMUSCULAR | Status: AC
  Administered 2021-08-12: 40 mg via INTRAVENOUS
  Filled 2021-08-12: qty 4

## 2021-08-12 MED ORDER — IOHEXOL 9 MG/ML PO SOLN
ORAL | Status: AC
  Filled 2021-08-12: qty 500

## 2021-08-12 MED ORDER — INSULIN ASPART 100 UNIT/ML IJ SOLN: 0.0000 [IU] | Freq: Every day | INTRAMUSCULAR | Status: DC

## 2021-08-12 MED ORDER — MORPHINE SULFATE (PF) 2 MG/ML IV SOLN
2.0000 mg | Freq: Once | INTRAVENOUS | Status: AC
  Administered 2021-08-12: 2 mg via INTRAVENOUS
  Filled 2021-08-12: qty 1

## 2021-08-12 MED ORDER — IOHEXOL 350 MG/ML SOLN
100.0000 mL | Freq: Once | INTRAVENOUS | Status: AC | PRN
  Administered 2021-08-12: 80 mL via INTRAVENOUS

## 2021-08-12 MED ORDER — LABETALOL HCL 5 MG/ML IV SOLN
5.0000 mg | Freq: Four times a day (QID) | INTRAVENOUS | Status: DC | PRN
  Administered 2021-08-13 (×2): 5 mg via INTRAVENOUS
  Filled 2021-08-12 (×2): qty 4

## 2021-08-12 MED ORDER — LACTATED RINGERS IV SOLN: INTRAVENOUS | Status: DC

## 2021-08-12 MED ORDER — INSULIN GLARGINE-YFGN 100 UNIT/ML ~~LOC~~ SOLN
20.0000 [IU] | Freq: Every day | SUBCUTANEOUS | Status: DC
  Administered 2021-08-13 – 2021-08-15 (×3): 20 [IU] via SUBCUTANEOUS
  Filled 2021-08-12 (×4): qty 0.2

## 2021-08-12 MED ORDER — INSULIN ASPART 100 UNIT/ML IJ SOLN
4.0000 [IU] | Freq: Three times a day (TID) | INTRAMUSCULAR | Status: DC
  Administered 2021-08-12 – 2021-08-13 (×2): 4 [IU] via SUBCUTANEOUS

## 2021-08-12 NOTE — Progress Notes (Addendum)
Inpatient Diabetes Program Recommendations  AACE/ADA: New Consensus Statement on Inpatient Glycemic Control (2015)  Target Ranges:  Prepandial:   less than 140 mg/dL      Peak postprandial:   less than 180 mg/dL (1-2 hours)      Critically ill patients:  140 - 180 mg/dL   Lab Results  Component Value Date   GLUCAP 132 (H) 08/12/2021   HGBA1C 13.5 (H) 08/11/2021    Review of Glycemic Control Results for Diamond [name], Diamond [name] (MRN [id number]) as of 08/12/2021 10:31  Ref. Range 08/12/2021 04:40 08/12/2021 05:44 08/12/2021 06:31 08/12/2021 07:16  Glucose-Capillary Latest Ref Range: 70 - 99 mg/dL 116 (H) 126 (H) 153 (H) 132 (H)   Diabetes history: DM 1 Outpatient Diabetes medications:  Lantus 25 units q HS Apidra 5-20 units tid ac Current orders for Inpatient glycemic control:  Semglee 10 units q HS Novolog 4 units tid with meals  Novolog moderate tid with meals and HS  Inpatient Diabetes Program Recommendations:   Due to patient being Type 1, consider reducing Novolog correction to sensitive tid with meals.  May need increase in Semglee to 20 units daily.    Thanks,  Adah Perl, RN, BC-ADM Inpatient Diabetes Coordinator Pager [phone]  (8a-5p)  11:50a- attempted to call patient to discuss A1C. No answer.  Per RN, patient does not want to talk at this time.  Will follow.

## 2021-08-12 NOTE — Progress Notes (Signed)
  Echocardiogram 2D Echocardiogram has been performed.  Diamond [name] 08/12/2021, 11:53 AM

## 2021-08-12 NOTE — Progress Notes (Signed)
Hyperosmolar state-transition off insulin drip Serum glucose 110,  bicarb 26 She was started on insulin drip, IV LR with IV potassium per DKA protocol in the ED Anion gap already closed, currently at 3 Continue IV D5 LR and maintain blood glucose within 200-250 mg/dL Clear liquid consistent carb diet will be provided Subcu insulin 10 units x 1 will be given Continue IV D5 LR for about 2 hours after subcu insulin and check BMP and VBG prior to discontinuing IV drip Notify physician if patient is able to tolerate above without vomiting and patient will be started on basal insulin regimen and sliding scale If patient continues to have nausea/vomiting, patient may require further IV D5 LR and titrated insulin drip pending better control of nausea/vomiting.

## 2021-08-12 NOTE — Progress Notes (Signed)
Patient's bp 184/101 around 2000. Patient also has +2 pitting edema to BLE with generalized edema and abdominal distention. Notified Dr. Josephine Cables. Patient also takes Hydrochlorothiazide, Lopressor, Imdur, Hydralazine, and Lasix at home and has not taken in several days per patient account. Spoke to Dr. Josephine Cables about patient's home medications. New orders received to administer Lasix x 1 dose and to discontinue IV fluids. Patient's BP is presently 164/127.

## 2021-08-12 NOTE — Progress Notes (Signed)
PROGRESS NOTE  Diamond [name]:[id number] DOB: 19-Jan-1996 DOA: 08/11/2021 PCP: Diamond Dryer, PA-C  Brief History:   25 y.o. female with medical history of  DM type 1, bipolar disorder, anasarca, Crohn's Disease, and hypertension, MSSA bactermia, nephrotic syndrome presenting nausea and vomiting that began on the evening of 08/10/2021.  The patient checked her blood sugar at that time, and she stated that it was in the 60s.  She drank some orange juice and subsequently developed abdominal pain with nausea and vomiting.  She felt like she would just sleep it off.  However when she woke up the morning she continued to have some nausea and abdominal pain.  She continued to have nausea and vomiting.  She developed chest pain or shortness of breath on the morning of 08/11/2021.  She also developed blurry vision bilateral eyes.  As result, the patient presented for further evaluation.  She endorses compliance with all her medications including her insulin however review of the medical record shows that the patient has a long history of poor compliance. She denies any fevers, chills, headache, neck pain, diarrhea, hematochezia, melena. In the emergency department, the patient had low-grade temperature of 99.7 F.  She was tachycardic up to 115.  She was hemodynamically stable.  Oxygen saturation was 86% on room air.  She was placed on 4 L with saturation up to 96%.  BMP shows sodium 130, potassium 4.8, chloride 96, CO2 94, anion gap 10.  Serum glucose was 1008.  Serum creatinine 1.37.  LFTs were unremarkable.  Lipase 41.  WBC 10.3, hemoglobin 14.7 platelets 289,000.  Initial troponin was 72.  EKG shows sinus tachycardia with nonspecific T wave changes which were unchanged from previous EKGs.  BNP was 192.  Chest x-ray showed bibasilar opacities, right greater than left.  The patient was started on levofloxacin.  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/nephrotic syndrome -Urine protein creatinine ratio of 6.55 on 07/13/2021 -CT abd/pelvis for abd pain   Atypical chest pain/elevated troponin -Cycle troponins 72>>106>>132>>125>>110 -Echo EF 60-65%, no WMA -Personally reviewed EKG--sinus tachycardia, nonspecific T wave changes -no chest pain presently -due to demand ischemia   Anasarca/nephrotic syndrome -07/13/2021 urine protein creatinine ratio 6.55 -There was concerns about possible allergy to lisinopril which was discontinued   Bipolar disorder -Previously taking trazodone and Celexa -It is unclear whether the patient is still taking these   Hyponatremia -Secondary to hyperglycemia -resolved   Essential hypertension -Holding metoprolol due to soft BPs  -monitor      Status is: Inpatient  Remains inpatient appropriate because:Inpatient level of care appropriate due to severity of illness  Dispo: The patient is from: Home              Anticipated d/c is to: Home              Patient currently is not medically stable to d/c.   Difficult to place patient No        Family Communication:  no Family at bedside  Consultants:  none  Code Status:  FULL  DVT Prophylaxis: [place] Lovenox   Procedures: As Listed in Progress Note Above  Antibiotics: Levoflox 9/9>>       Subjective: Patient denies fevers, chills, headache, chest pain, dyspnea, diarrhea, abdominal pain, dysuria, hematuria, hematochezia, and melena.  Complains of nausea, no  vomiting   Objective: Vitals:   08/12/21 0700 08/12/21 0714 08/12/21 1100 08/12/21 1120  BP: 122/66  (!) 164/83   Pulse: (!) 104 (!) 107    Resp: 11 (!) 26 11 15   Temp:  98.6 F (37 C)  98.3 F (36.8 C)  TempSrc:  Oral  Oral  SpO2: 100% 100%    Weight:      Height:        Intake/Output Summary (Last 24 hours) at 08/12/2021 1249 Last data filed at 08/12/2021 1100 Gross per 24 hour  Intake 3210.02 ml  Output 800 ml  Net 2410.02 ml   Weight change:  Exam:  General:  Pt is alert, follows commands appropriately, not in acute distress HEENT: No icterus, No thrush, No neck mass, [place]/AT Cardiovascular: RRR, S1/S2, no rubs, no gallops Respiratory: CTA bilaterally, no wheezing, no crackles, no rhonchi Abdomen: Soft/+BS, diffuse tender, non distended, no guarding Extremities: No edema, No lymphangitis, No petechiae, No rashes, no synovitis   Data Reviewed: I have personally reviewed following labs and imaging studies Basic Metabolic Panel: Recent Labs  Lab 08/11/21 1736 08/11/21 2139 08/12/21 0120 08/12/21 0400 08/12/21 0858  NA 128* 131* 135 133* 136  K 4.7 4.0 4.0 4.0 4.0  CL 95* 101 103 104 104  CO2 20* 24 26 26 27   GLUCOSE 897* 528* 216* 127* 99  BUN 25* 22* 19 20 18   CREATININE 1.34* 1.20* 1.11* 1.08* 1.12*  CALCIUM 8.2* 8.2* 8.2* 7.9* 8.3*   Liver Function Tests: Recent Labs  Lab 08/11/21 1451  AST 14*  ALT 16  ALKPHOS 109  BILITOT 1.0  PROT 7.1  ALBUMIN 2.7*   Recent Labs  Lab 08/11/21 1451  LIPASE 41   No results for input(s): AMMONIA in the last 168 hours. Coagulation Profile: No results for input(s): INR, PROTIME in the last 168 hours. CBC: Recent Labs  Lab 08/11/21 1451  WBC 10.3  NEUTROABS 9.2*  HGB 14.7  HCT 45.8  MCV 93.5  PLT 289   Cardiac Enzymes: No results for input(s): CKTOTAL, CKMB, CKMBINDEX, TROPONINI in the last 168 hours. BNP: Invalid input(s): POCBNP CBG: Recent Labs  Lab 08/12/21 0440 08/12/21 0544 08/12/21 0631  08/12/21 0716 08/12/21 1118  GLUCAP 116* 126* 153* 132* 150*   HbA1C: Recent Labs    08/11/21 1845  HGBA1C 13.5*   Urine analysis:    Component Value Date/Time   COLORURINE STRAW (A) 03/31/2021 1905   APPEARANCEUR HAZY (A) 03/31/2021 1905   APPEARANCEUR Clear 11/15/2020 1531   LABSPEC 1.011 03/31/2021 1905   PHURINE 7.0 03/31/2021 1905   GLUCOSEU >=500 (A) 03/31/2021 1905   HGBUR NEGATIVE 03/31/2021 1905   BILIRUBINUR NEGATIVE 03/31/2021 1905   BILIRUBINUR NEG 12/05/2020 1353   BILIRUBINUR Negative 11/15/2020 [place] 03/31/2021 1905   PROTEINUR >=300 (A) 03/31/2021 1905   UROBILINOGEN 0.2 12/05/2020 1353   UROBILINOGEN 0.2 09/13/2012 2014   NITRITE NEGATIVE 03/31/2021 1905   LEUKOCYTESUR TRACE (A) 03/31/2021 1905   Sepsis Labs: @LABRCNTIP (procalcitonin:4,lacticidven:4) ) Recent Results (from the past 240 hour(s))  Blood culture (routine x 2)     Status: None (Preliminary result)   Collection Time: 08/11/21  5:36 PM   Specimen: Site Not Specified; Blood  Result Value Ref Range Status   Specimen Description SITE NOT SPECIFIED  Final   Special Requests   Final    Blood Culture adequate volume BOTTLES DRAWN AEROBIC ONLY   Culture   Final    NO GROWTH < 24 HOURS Performed at [facility], [address], [place], [place] [address]    Report Status PENDING  Incomplete  Blood culture (routine x 2)     Status: None (Preliminary result)   Collection Time: 08/11/21  5:36 PM   Specimen: Site Not Specified; Blood  Result Value Ref Range Status   Specimen Description SITE NOT SPECIFIED  Final   Special Requests   Final    Blood Culture adequate volume BOTTLES DRAWN AEROBIC AND ANAEROBIC   Culture   Final    NO GROWTH < 24 HOURS Performed at [facility], [address] [place], [place] [address]    Report Status PENDING  Incomplete  Resp Panel by RT-PCR (Flu A&B, Covid) Nasal Mucosa     Status: None   Collection Time: 08/11/21  6:30 PM   Specimen:  Nasal Mucosa; Nasopharyngeal(NP) swabs in vial transport medium  Result Value Ref Range Status   SARS Coronavirus 2 by RT PCR NEGATIVE NEGATIVE Final    Comment: (NOTE) SARS-CoV-2 target nucleic acids are NOT DETECTED.  The SARS-CoV-2 RNA is generally detectable in upper respiratory specimens during the acute phase of infection. The lowest concentration of SARS-CoV-2 viral copies this assay can detect is 138 copies/mL. A negative result does not preclude SARS-Cov-2 infection and should not be used as the sole basis for treatment or other patient management decisions. A negative result may occur with  improper specimen collection/handling, submission of specimen other than nasopharyngeal swab, presence of viral mutation(s) within the areas targeted by this assay, and inadequate number of viral copies(<138 copies/mL). A negative result must be combined with clinical observations, patient history, and epidemiological information. The expected result is Negative.  Fact Sheet for Patients:  EntrepreneurPulse.com.au  Fact Sheet for Healthcare Providers:  IncredibleEmployment.be  This test is no t yet approved or cleared by the [place] FDA and  has been authorized for detection and/or diagnosis of SARS-CoV-2 by FDA under an Emergency Use Authorization (EUA). This EUA will remain  in effect (meaning this test can be used) for the duration of the COVID-19 declaration under Section 564(b)(1) of the Act, 21 U.S.C.section 360bbb-3(b)(1), unless the authorization is terminated  or revoked sooner.       Influenza A by PCR NEGATIVE NEGATIVE Final   Influenza B by PCR NEGATIVE NEGATIVE Final    Comment: (NOTE) The Xpert Xpress SARS-CoV-2/FLU/RSV plus assay is intended as an aid in the diagnosis of influenza from Nasopharyngeal swab specimens and should not be used as a sole basis for treatment. Nasal washings and aspirates are unacceptable for Xpert  Xpress SARS-CoV-2/FLU/RSV testing.  Fact Sheet for Patients: EntrepreneurPulse.com.au  Fact Sheet for Healthcare Providers: IncredibleEmployment.be  This test is not yet approved or cleared by the [place] FDA and has been authorized for detection and/or diagnosis of SARS-CoV-2 by FDA under an Emergency Use Authorization (EUA). This EUA will remain in effect (meaning this test can be used) for the duration of the COVID-19 declaration under Section 564(b)(1) of the Act, 21 U.S.C. section 360bbb-3(b)(1), unless the authorization is terminated or revoked.  Performed at [facility], [address] [place], [place] [address]   MRSA Next Gen  by PCR, Nasal     Status: None   Collection Time: 08/11/21  6:36 PM   Specimen: Nasal Mucosa; Nasal Swab  Result Value Ref Range Status   MRSA by PCR Next Gen NOT DETECTED NOT DETECTED Final    Comment: (NOTE) The GeneXpert MRSA Assay (FDA approved for NASAL specimens only), is one component of a comprehensive MRSA colonization surveillance program. It is not intended to diagnose MRSA infection nor to guide or monitor treatment for MRSA infections. Test performance is not FDA approved in patients less than 56 years old. Performed at [facility], [address], [place], [place] [address]      Scheduled Meds:  iohexol       Chlorhexidine Gluconate Cloth  6 each Topical Daily   enoxaparin (LOVENOX) injection  40 mg Subcutaneous Q24H   insulin aspart  0-15 Units Subcutaneous TID WC   insulin aspart  0-5 Units Subcutaneous QHS   insulin aspart  4 Units Subcutaneous TID WC   [START ON 08/13/2021] insulin glargine-yfgn  20 Units Subcutaneous Daily   lidocaine  1 patch Transdermal Q24H   Continuous Infusions:  lactated ringers     levofloxacin (LEVAQUIN) IV 500 mg (08/12/21 0917)    Procedures/Studies: DG Chest Port 1 View  Result Date: 08/11/2021 CLINICAL DATA:  Chest pain, generalized edema  throughout phase abdomen limbs. 25 year old female. EXAM: PORTABLE CHEST 1 VIEW COMPARISON:  August 06, 2021. FINDINGS: Trachea midline. Cardiomediastinal contours and hilar structures grossly stable. EKG leads project over the chest. Patchy basilar predominant opacities in the chest have developed since the previous study worse on the RIGHT than the LEFT. No sign of lobar consolidative process. No effusion on frontal radiograph. No sign of pneumothorax. On limited assessment no acute skeletal process. IMPRESSION: Patchy basilar predominant opacities in the chest have developed since the previous study. Suspicious for multifocal pneumonia. Electronically Signed   By: Zetta Bills M.D.   On: 08/11/2021 15:02   ECHOCARDIOGRAM COMPLETE  Result Date: 08/12/2021    ECHOCARDIOGRAM REPORT   Patient Name:   Diamond [name] Date of Exam: 08/12/2021 Medical Rec #:  [id number]        Height:       59.0 in Accession #:    [id number]       Weight:       142.9 lb Date of Birth:  09/02/1996        BSA:          1.599 m Patient Age:    25 years         BP:           112/74 mmHg Patient Gender: F                HR:           99 bpm. Exam Location:  [facility] Procedure: 2D Echo, Cardiac Doppler and Color Doppler Indications:    R07.9* Chest pain, unspecified  History:        Patient has prior history of Echocardiogram examinations, most                 recent 03/09/2021. Stroke; Risk Factors:Hypertension and Diabetes.  Sonographer:    Bernadene Person RDCS Referring Phys: [phone] Cheridan Kibler IMPRESSIONS  1. Left ventricular ejection fraction, by estimation, is 60 to 65%. The left ventricle has normal function. The left ventricle has no regional wall motion abnormalities. There is mild left ventricular hypertrophy. Left ventricular diastolic parameters were normal.  2. Right ventricular systolic function is normal. The right ventricular size is normal. There is normal pulmonary artery systolic pressure.  3. The mitral valve is normal  in structure. No evidence of mitral valve regurgitation. No evidence of mitral stenosis.  4. The aortic valve is tricuspid. Aortic valve regurgitation is not visualized. No aortic stenosis is present.  5. The inferior vena cava is normal in size with greater than 50% respiratory variability, suggesting right atrial pressure of 3 mmHg. FINDINGS  Left Ventricle: Left ventricular ejection fraction, by estimation, is 60 to 65%. The left ventricle has normal function. The left ventricle has no regional wall motion abnormalities. The left ventricular internal cavity size was normal in size. There is  mild left ventricular hypertrophy. Left ventricular diastolic parameters were normal. Right Ventricle: The right ventricular size is normal. Right ventricular systolic function is normal. There is normal pulmonary artery systolic pressure. The tricuspid regurgitant velocity is 2.03 m/s, and with an assumed right atrial pressure of 3 mmHg,  the estimated right ventricular systolic pressure is 35.5 mmHg. Left Atrium: Left atrial size was normal in size. Right Atrium: Right atrial size was normal in size. Pericardium: Trivial pericardial effusion is present. Mitral Valve: The mitral valve is normal in structure. No evidence of mitral valve regurgitation. No evidence of mitral valve stenosis. Tricuspid Valve: The tricuspid valve is normal in structure. Tricuspid valve regurgitation is trivial. No evidence of tricuspid stenosis. Aortic Valve: The aortic valve is tricuspid. Aortic valve regurgitation is not visualized. No aortic stenosis is present. Aortic valve mean gradient measures 3.0 mmHg. Aortic valve peak gradient measures 3.5 mmHg. Pulmonic Valve: The pulmonic valve was normal in structure. Pulmonic valve regurgitation is not visualized. No evidence of pulmonic stenosis. Aorta: The aortic root is normal in size and structure. Venous: The inferior vena cava is normal in size with greater than 50% respiratory variability,  suggesting right atrial pressure of 3 mmHg. IAS/Shunts: No atrial level shunt detected by color flow Doppler.  LEFT VENTRICLE PLAX 2D LVIDd:         3.43 cm  Diastology LVIDs:         2.15 cm  LV e' lateral:   11.70 cm/s LV PW:         1.12 cm  LV E/e' lateral: 5.8 LV IVS:        0.98 cm LVOT diam:     1.60 cm LVOT Area:     2.01 cm  RIGHT VENTRICLE RV S prime:     12.70 cm/s TAPSE (M-mode): 1.8 cm LEFT ATRIUM             Index       RIGHT ATRIUM           Index LA diam:        2.60 cm 1.63 cm/m  RA Area:     12.30 cm LA Vol (A2C):   20.3 ml 12.70 ml/m RA Volume:   26.40 ml  16.51 ml/m LA Vol (A4C):   18.6 ml 11.64 ml/m LA Biplane Vol: 20.9 ml 13.07 ml/m  AORTIC VALVE AV Vmax:      94.00 cm/s AV Vmean:     79.500 cm/s AV VTI:       0.162 m AV Peak Grad: 3.5 mmHg AV Mean Grad: 3.0 mmHg  AORTA Ao Root diam: 2.40 cm Ao Asc diam:  2.50 cm MITRAL VALVE               TRICUSPID VALVE MV  Area (PHT): 6.12 cm    TR Peak grad:   16.5 mmHg MV Decel Time: 124 msec    TR Vmax:        203.00 cm/s MV E velocity: 67.90 cm/s MV A velocity: 66.20 cm/s  SHUNTS MV E/A ratio:  1.03        Systemic Diam: 1.60 cm Kirk Ruths MD Electronically signed by Kirk Ruths MD Signature Date/Time: 08/12/2021/12:32:31 PM    Final     Orson Eva, DO  [facility]  If 7PM-7AM, please contact night-coverage [email or web address] Password [email or web address] 08/12/2021, 12:49 PM   LOS: 1 day

## 2021-08-13 DIAGNOSIS — I10 Essential (primary) hypertension: Secondary | ICD-10-CM

## 2021-08-13 DIAGNOSIS — R7989 Other specified abnormal findings of blood chemistry: Secondary | ICD-10-CM

## 2021-08-13 DIAGNOSIS — R778 Other specified abnormalities of plasma proteins: Secondary | ICD-10-CM

## 2021-08-13 LAB — RAPID URINE DRUG SCREEN, HOSP PERFORMED
Amphetamines: NOT DETECTED
Barbiturates: NOT DETECTED
Benzodiazepines: NOT DETECTED
Cocaine: NOT DETECTED
Opiates: POSITIVE — AB
Tetrahydrocannabinol: NOT DETECTED

## 2021-08-13 LAB — CULTURE, BLOOD (ROUTINE X 2): Special Requests: ADEQUATE

## 2021-08-13 LAB — GLUCOSE, CAPILLARY
Glucose-Capillary: 227 mg/dL — ABNORMAL HIGH (ref 70–99)
Glucose-Capillary: 254 mg/dL — ABNORMAL HIGH (ref 70–99)
Glucose-Capillary: 95 mg/dL (ref 70–99)
Glucose-Capillary: 95 mg/dL (ref 70–99)
Glucose-Capillary: 82 mg/dL (ref 70–99)
Glucose-Capillary: 101 mg/dL — ABNORMAL HIGH (ref 70–99)
Glucose-Capillary: 100 mg/dL — ABNORMAL HIGH (ref 70–99)

## 2021-08-13 LAB — HEPATIC FUNCTION PANEL
ALT: 11 U/L (ref 0–44)
AST: 20 U/L (ref 15–41)
Albumin: 1.8 g/dL — ABNORMAL LOW (ref 3.5–5.0)
Alkaline Phosphatase: 76 U/L (ref 38–126)
Bilirubin, Direct: 0.1 mg/dL (ref 0.0–0.2)
Indirect Bilirubin: 0.4 mg/dL (ref 0.3–0.9)
Total Bilirubin: 0.5 mg/dL (ref 0.3–1.2)
Total Protein: 5.5 g/dL — ABNORMAL LOW (ref 6.5–8.1)

## 2021-08-13 LAB — CBC
HCT: 32.1 % — ABNORMAL LOW (ref 36.0–46.0)
Hemoglobin: 10.4 g/dL — ABNORMAL LOW (ref 12.0–15.0)
MCH: 29.6 pg (ref 26.0–34.0)
MCHC: 32.4 g/dL (ref 30.0–36.0)
MCV: 91.5 fL (ref 80.0–100.0)
Platelets: 356 10*3/uL (ref 150–400)
RBC: 3.51 MIL/uL — ABNORMAL LOW (ref 3.87–5.11)
RDW: 12.6 % (ref 11.5–15.5)
WBC: 7.7 10*3/uL (ref 4.0–10.5)
nRBC: 0 % (ref 0.0–0.2)

## 2021-08-13 LAB — URINALYSIS, ROUTINE W REFLEX MICROSCOPIC
Bilirubin Urine: NEGATIVE
Glucose, UA: 250 mg/dL — AB
Ketones, ur: NEGATIVE mg/dL
Leukocytes,Ua: NEGATIVE
Nitrite: NEGATIVE
Protein, ur: 100 mg/dL — AB
Specific Gravity, Urine: 1.01 (ref 1.005–1.030)
pH: 6.5 (ref 5.0–8.0)

## 2021-08-13 LAB — PREGNANCY, URINE: Preg Test, Ur: NEGATIVE

## 2021-08-13 LAB — BASIC METABOLIC PANEL
Anion gap: 7 (ref 5–15)
BUN: 12 mg/dL (ref 6–20)
CO2: 26 mmol/L (ref 22–32)
Calcium: 8 mg/dL — ABNORMAL LOW (ref 8.9–10.3)
Chloride: 99 mmol/L (ref 98–111)
Creatinine, Ser: 1.1 mg/dL — ABNORMAL HIGH (ref 0.44–1.00)
GFR, Estimated: >60 mL/min (ref >60)
Glucose, Bld: 249 mg/dL — ABNORMAL HIGH (ref 70–99)
Potassium: 3.9 mmol/L (ref 3.5–5.1)
Sodium: 132 mmol/L — ABNORMAL LOW (ref 135–145)

## 2021-08-13 LAB — URINALYSIS, MICROSCOPIC (REFLEX)

## 2021-08-13 LAB — MAGNESIUM: Magnesium: 1.7 mg/dL (ref 1.7–2.4)

## 2021-08-13 MED ORDER — HYDRALAZINE HCL 25 MG PO TABS
25.0000 mg | ORAL_TABLET | Freq: Three times a day (TID) | ORAL | Status: DC
  Administered 2021-08-13 – 2021-08-15 (×3): 25 mg via ORAL
  Filled 2021-08-13 (×7): qty 1

## 2021-08-13 MED ORDER — MAGNESIUM SULFATE 2 GM/50ML IV SOLN
2.0000 g | Freq: Once | INTRAVENOUS | Status: AC
  Administered 2021-08-13: 2 g via INTRAVENOUS
  Filled 2021-08-13: qty 50

## 2021-08-13 MED ORDER — METOPROLOL TARTRATE 50 MG PO TABS
50.0000 mg | ORAL_TABLET | Freq: Two times a day (BID) | ORAL | Status: DC
  Administered 2021-08-13 – 2021-08-16 (×5): 50 mg via ORAL
  Filled 2021-08-13 (×6): qty 1

## 2021-08-13 MED ORDER — PANTOPRAZOLE SODIUM 40 MG PO TBEC
40.0000 mg | DELAYED_RELEASE_TABLET | Freq: Every day | ORAL | Status: DC
  Administered 2021-08-13 – 2021-08-15 (×3): 40 mg via ORAL
  Filled 2021-08-13 (×3): qty 1

## 2021-08-13 MED ORDER — ISOSORBIDE MONONITRATE ER 60 MG PO TB24
30.0000 mg | ORAL_TABLET | Freq: Every day | ORAL | Status: DC
  Administered 2021-08-13 – 2021-08-16 (×4): 30 mg via ORAL
  Filled 2021-08-13 (×4): qty 1

## 2021-08-13 NOTE — Progress Notes (Signed)
PROGRESS NOTE  Diamond [name] [name]:[id number] DOB: 11/11/1996 DOA: 08/11/2021 PCP: [name], PA-C  Brief History:   25 y.o. female with medical history of  DM type 1, bipolar disorder, anasarca, Crohn's Disease, and hypertension, MSSA bactermia, nephrotic syndrome presenting nausea and vomiting that began on the evening of 08/10/2021.  The patient checked her blood sugar at that time, and she stated that it was in the 60s.  She drank some orange juice and subsequently developed abdominal pain with nausea and vomiting.  She felt like she would just sleep it off.  However when she woke up the morning she continued to have some nausea and abdominal pain.  She continued to have nausea and vomiting.  She developed chest pain or shortness of breath on the morning of 08/11/2021.  She also developed blurry vision bilateral eyes.  As result, the patient presented for further evaluation.  She endorses compliance with all her medications including her insulin however review of the medical record shows that the patient has a long history of poor compliance. She denies any fevers, chills, headache, neck pain, diarrhea, hematochezia, melena. In the emergency department, the patient had low-grade temperature of 99.7 F.  She was tachycardic up to 115.  She was hemodynamically stable.  Oxygen saturation was 86% on room air.  She was placed on 4 L with saturation up to 96%.  BMP shows sodium 130, potassium 4.8, chloride 96, CO2 94, anion gap 10.  Serum glucose was 1008.  Serum creatinine 1.37.  LFTs were unremarkable.  Lipase 41.  WBC 10.3, hemoglobin 14.7 platelets 289,000.  Initial troponin was 72.  EKG shows sinus tachycardia with nonspecific T wave changes which were unchanged from previous EKGs.  BNP was 192.  Chest x-ray showed bibasilar opacities, right greater than left.  The patient was started on levofloxacin.   Assessment/Plan: Hyperosmolar state -patient started on IV insulin with q 1 hour CBG  check and q 4 hour BMPs -pt started on aggressive fluid resuscitation -Electrolytes were monitored and repleted -transitioned to [place] insulin once anion gap closed -diet was advanced once anion gap closed -08/11/21 A1C--13.5 -increase Semglee to 20 units   Acute respiratory failure with hypoxia -Secondary to pneumonia -Stable on 4 L nasal cannula>>weaned to RA   Lobar pneumonia -Check procalcitonin--11.42 -Continue levofloxacin   Anasarca/nephrotic syndrome -07/13/2021 urine protein creatinine ratio 6.55 -There was concerns about possible allergy to lisinopril which was discontinued -nephrotic range proteinuria is major reason for anasarca -diuretics held initially due to volume depletion and low BP -restart IVF if po intake remains poor -restart diuretics if po intake adequate and BP stable  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 This is seen in the context of markedly distended and somewhat thickened urinary bladder;  Focal area of hyperenhancement in the RIGHT hemi liver perhaps related to vascular phenomenon given focal appearance  Essential Hypertension -HCTZ, metoprolol, hydralazine initially held due to soft BPs -restart metoprolol, hydralazine, imdur   Atypical chest pain/elevated troponin -Cycle troponins 72>>106>>132>>125>>110 -9/9 Echo EF 60-65%, no WMA -Personally reviewed EKG--sinus tachycardia, nonspecific T wave changes -no chest pain presently -due to demand ischemia   Bipolar disorder -Previously taking trazodone and Celexa -It is unclear whether the patient is still taking these   Hyponatremia -Secondary to hyperglycemia -resolved  Status is: Inpatient   Remains inpatient appropriate because:Inpatient level of care appropriate due to severity of illness    Dispo: The patient is from: Home              Anticipated d/c is to: Home              Patient currently is not medically stable to d/c.              Difficult to place patient No               Family Communication:  no Family at bedside   Consultants:  none   Code Status:  FULL   DVT Prophylaxis: [place] Lovenox     Procedures: As Listed in Progress Note Above   Antibiotics: Levoflox 9/9>>  Subjective:  Patient complains of her chronic abd pain--overall about same.  Denies f/c, cp, sob, n/v/d Objective: Vitals:   08/13/21 0635 08/13/21 0700 08/13/21 0800 08/13/21 0900  BP: (!) 167/113 (!) 152/100 (!) 188/121 (!) 143/98  Pulse: (!) 117 (!) 118 (!) 118 (!) 117  Resp: 14 (!) 9 15 (!) 0  Temp:    99.2 F (37.3 C)  TempSrc:      SpO2: 100% 98% 99% 97%  Weight:      Height:        Intake/Output Summary (Last 24 hours) at 08/13/2021 1042 Last data filed at 08/13/2021 0900 Gross per 24 hour  Intake 3584.39 ml  Output 3250 ml  Net 334.39 ml   Weight change:  Exam:  General:  Pt is alert, follows commands appropriately, not in acute distress HEENT: No icterus, No thrush, No neck mass, [place]/AT Cardiovascular: RRR, S1/S2, no rubs, no gallops Respiratory: bibasilar rales. No wheeze Abdomen: Soft/+BS, non tender, non distended, no guarding Extremities: 1+LEedema, No lymphangitis, No petechiae, No rashes, no synovitis   Data Reviewed: I have personally reviewed following labs and imaging studies Basic Metabolic Panel: Recent Labs  Lab 08/11/21 2139 08/12/21 0120 08/12/21 0400 08/12/21 0858 08/13/21 0345  NA 131* 135 133* 136 132*  K 4.0 4.0 4.0 4.0 3.9  CL 101 103 104 104 99  CO2 24 26 26 27 26   GLUCOSE 528* 216* 127* 99 249*  BUN 22* 19 20 18 12   CREATININE 1.20* 1.11* 1.08* 1.12* 1.10*  CALCIUM 8.2* 8.2* 7.9* 8.3* 8.0*  MG  --   --   --   --  1.7   Liver Function Tests: Recent Labs  Lab 08/11/21 1451 08/13/21 0345  AST 14* 20  ALT 16 11  ALKPHOS  109 76  BILITOT 1.0 0.5  PROT 7.1 5.5*  ALBUMIN 2.7* 1.8*   Recent Labs  Lab 08/11/21 1451  LIPASE 41   No results for input(s): AMMONIA in the last 168 hours. Coagulation Profile: No results for input(s): INR, PROTIME in the last 168 hours. CBC: Recent Labs  Lab 08/11/21 1451 08/13/21 0345  WBC 10.3 7.7  NEUTROABS 9.2*  --   HGB 14.7 10.4*  HCT 45.8 32.1*  MCV 93.5 91.5  PLT 289 356   Cardiac Enzymes: No results for input(s): CKTOTAL, CKMB, CKMBINDEX, TROPONINI in the last 168 hours. BNP: Invalid input(s): POCBNP CBG: Recent Labs  Lab 08/12/21 1118 08/12/21 1619 08/12/21 2130 08/13/21 0305 08/13/21 0736  GLUCAP 150* 56* 196* 227* 254*   HbA1C: Recent Labs    08/11/21 1845  HGBA1C 13.5*   Urine analysis:    Component Value Date/Time   COLORURINE YELLOW  08/12/2021 [place] 08/12/2021 2249   APPEARANCEUR Clear 11/15/2020 1531   LABSPEC 1.010 08/12/2021 2249   PHURINE 6.5 08/12/2021 2249   GLUCOSEU 250 (A) 08/12/2021 2249   HGBUR SMALL (A) 08/12/2021 2249   BILIRUBINUR NEGATIVE 08/12/2021 2249   BILIRUBINUR NEG 12/05/2020 1353   BILIRUBINUR Negative 11/15/2020 1531   KETONESUR NEGATIVE 08/12/2021 2249   PROTEINUR 100 (A) 08/12/2021 2249   UROBILINOGEN 0.2 12/05/2020 1353   UROBILINOGEN 0.2 09/13/2012 2014   NITRITE NEGATIVE 08/12/2021 2249   LEUKOCYTESUR NEGATIVE 08/12/2021 2249   Sepsis Labs: @LABRCNTIP (procalcitonin:4,lacticidven:4) ) Recent Results (from the past 240 hour(s))  Blood culture (routine x 2)     Status: None (Preliminary result)   Collection Time: 08/11/21  5:36 PM   Specimen: Site Not Specified; Blood  Result Value Ref Range Status   Specimen Description   Final    SITE NOT SPECIFIED Performed at [facility], [address] [place], [place] [address]    Special Requests   Final    Blood Culture adequate volume BOTTLES DRAWN AEROBIC ONLY Performed at [facility], [address] [place], [place] [address]     Culture  Setup Time   Final    GRAM POSITIVE COCCI AEROBIC BOTTLE ONLY Gram Stain Report Called to,Read Back By and Verified With: COGER @ 1017 ON [id number] BY HENDERSON L CRITICAL RESULT CALLED TO, READ BACK BY AND VERIFIED WITH: L,HILTON RN @1826  08/12/21 EB Performed at [facility], [place] [address], [place], [place] [address]    Culture GRAM POSITIVE COCCI  Final   Report Status PENDING  Incomplete  Blood culture (routine x 2)     Status: None (Preliminary result)   Collection Time: 08/11/21  5:36 PM   Specimen: Site Not Specified; Blood  Result Value Ref Range Status   Specimen Description SITE NOT SPECIFIED  Final   Special Requests   Final    Blood Culture adequate volume BOTTLES DRAWN AEROBIC AND ANAEROBIC   Culture   Final    NO GROWTH 2 DAYS Performed at [facility], [address], [place], [place] [address]    Report Status PENDING  Incomplete  Blood Culture ID Panel (Reflexed)     Status: Abnormal   Collection Time: 08/11/21  5:36 PM  Result Value Ref Range Status   Enterococcus faecalis NOT DETECTED NOT DETECTED Final   Enterococcus Faecium NOT DETECTED NOT DETECTED Final   Listeria monocytogenes NOT DETECTED NOT DETECTED Final   Staphylococcus species DETECTED (A) NOT DETECTED Final    Comment: CRITICAL RESULT CALLED TO, READ BACK BY AND VERIFIED WITH: L,HILTON RN @1826  08/12/21 EB    Staphylococcus aureus (BCID) NOT DETECTED NOT DETECTED Final   Staphylococcus epidermidis NOT DETECTED NOT DETECTED Final   Staphylococcus lugdunensis NOT DETECTED NOT DETECTED Final   Streptococcus species NOT DETECTED NOT DETECTED Final   Streptococcus agalactiae NOT DETECTED NOT DETECTED Final   Streptococcus pneumoniae NOT DETECTED NOT DETECTED Final   Streptococcus pyogenes NOT DETECTED NOT DETECTED Final   A.calcoaceticus-baumannii NOT DETECTED NOT DETECTED Final   Bacteroides fragilis NOT DETECTED NOT DETECTED Final   Enterobacterales NOT DETECTED NOT DETECTED Final    Enterobacter cloacae complex NOT DETECTED NOT DETECTED Final   Escherichia coli NOT DETECTED NOT DETECTED Final   Klebsiella aerogenes NOT DETECTED NOT DETECTED Final   Klebsiella oxytoca NOT DETECTED NOT DETECTED Final   Klebsiella pneumoniae NOT DETECTED NOT DETECTED Final   Proteus species NOT DETECTED NOT DETECTED  Final   Salmonella species NOT DETECTED NOT DETECTED Final   Serratia marcescens NOT DETECTED NOT DETECTED Final   Haemophilus influenzae NOT DETECTED NOT DETECTED Final   Neisseria meningitidis NOT DETECTED NOT DETECTED Final   Pseudomonas aeruginosa NOT DETECTED NOT DETECTED Final   Stenotrophomonas maltophilia NOT DETECTED NOT DETECTED Final   Candida albicans NOT DETECTED NOT DETECTED Final   Candida auris NOT DETECTED NOT DETECTED Final   Candida glabrata NOT DETECTED NOT DETECTED Final   Candida krusei NOT DETECTED NOT DETECTED Final   Candida parapsilosis NOT DETECTED NOT DETECTED Final   Candida tropicalis NOT DETECTED NOT DETECTED Final   Cryptococcus neoformans/gattii NOT DETECTED NOT DETECTED Final    Comment: Performed at [facility], [address] [place], [place] [address]  Resp Panel by RT-PCR (Flu A&B, Covid) Nasal Mucosa     Status: None   Collection Time: 08/11/21  6:30 PM   Specimen: Nasal Mucosa; Nasopharyngeal(NP) swabs in vial transport medium  Result Value Ref Range Status   SARS Coronavirus 2 by RT PCR NEGATIVE NEGATIVE Final    Comment: (NOTE) SARS-CoV-2 target nucleic acids are NOT DETECTED.  The SARS-CoV-2 RNA is generally detectable in upper respiratory specimens during the acute phase of infection. The lowest concentration of SARS-CoV-2 viral copies this assay can detect is 138 copies/mL. A negative result does not preclude SARS-Cov-2 infection and should not be used as the sole basis for treatment or other patient management decisions. A negative result may occur with  improper specimen collection/handling, submission of specimen  other than nasopharyngeal swab, presence of viral mutation(s) within the areas targeted by this assay, and inadequate number of viral copies(<138 copies/mL). A negative result must be combined with clinical observations, patient history, and epidemiological information. The expected result is Negative.  Fact Sheet for Patients:  EntrepreneurPulse.com.au  Fact Sheet for Healthcare Providers:  IncredibleEmployment.be  This test is no t yet approved or cleared by the [place] FDA and  has been authorized for detection and/or diagnosis of SARS-CoV-2 by FDA under an Emergency Use Authorization (EUA). This EUA will remain  in effect (meaning this test can be used) for the duration of the COVID-19 declaration under Section 564(b)(1) of the Act, 21 U.S.C.section 360bbb-3(b)(1), unless the authorization is terminated  or revoked sooner.       Influenza A by PCR NEGATIVE NEGATIVE Final   Influenza B by PCR NEGATIVE NEGATIVE Final    Comment: (NOTE) The Xpert Xpress SARS-CoV-2/FLU/RSV plus assay is intended as an aid in the diagnosis of influenza from Nasopharyngeal swab specimens and should not be used as a sole basis for treatment. Nasal washings and aspirates are unacceptable for Xpert Xpress SARS-CoV-2/FLU/RSV testing.  Fact Sheet for Patients: EntrepreneurPulse.com.au  Fact Sheet for Healthcare Providers: IncredibleEmployment.be  This test is not yet approved or cleared by the [place] FDA and has been authorized for detection and/or diagnosis of SARS-CoV-2 by FDA under an Emergency Use Authorization (EUA). This EUA will remain in effect (meaning this test can be used) for the duration of the COVID-19 declaration under Section 564(b)(1) of the Act, 21 U.S.C. section 360bbb-3(b)(1), unless the authorization is terminated or revoked.  Performed at [facility], [address], [place], [place]  [address]   MRSA Next Gen by PCR, Nasal     Status: None   Collection Time: 08/11/21  6:36 PM   Specimen: Nasal Mucosa; Nasal Swab  Result Value Ref Range Status   MRSA by PCR Next  Gen NOT DETECTED NOT DETECTED Final    Comment: (NOTE) The GeneXpert MRSA Assay (FDA approved for NASAL specimens only), is one component of a comprehensive MRSA colonization surveillance program. It is not intended to diagnose MRSA infection nor to guide or monitor treatment for MRSA infections. Test performance is not FDA approved in patients less than 56 years old. Performed at [facility], [address] [place], [place] [address]      Scheduled Meds:  Chlorhexidine Gluconate Cloth  6 each Topical Daily   enoxaparin (LOVENOX) injection  40 mg Subcutaneous Q24H   hydrALAZINE  25 mg Oral Q8H   insulin aspart  0-15 Units Subcutaneous TID WC   insulin aspart  0-5 Units Subcutaneous QHS   insulin aspart  4 Units Subcutaneous TID WC   insulin glargine-yfgn  20 Units Subcutaneous Daily   isosorbide mononitrate  30 mg Oral Daily   lidocaine  1 patch Transdermal Q24H   metoprolol tartrate  50 mg Oral BID   pantoprazole  40 mg Oral Daily   Continuous Infusions:  lactated ringers Stopped (08/12/21 2133)   levofloxacin (LEVAQUIN) IV 500 mg (08/12/21 0917)    Procedures/Studies: CT ABDOMEN PELVIS W CONTRAST  Result Date: 08/12/2021 CLINICAL DATA:  Acute nonlocalized abdominal pain in this patient with history of Crohn's disease with chronic epigastric and pelvic pain also with nausea and vomiting that began on 08/10/2021. EXAM: CT ABDOMEN AND PELVIS WITH CONTRAST TECHNIQUE: Multidetector CT imaging of the abdomen and pelvis was performed using the standard protocol following bolus administration of intravenous contrast. CONTRAST:  43m OMNIPAQUE IOHEXOL 350 MG/ML SOLN COMPARISON:  Comparison made with July 18, 2021. FINDINGS: Lower chest: Small bilateral pleural effusions have developed since the prior study  along with patchy areas of ground-glass opacity in the chest. Hepatobiliary: Liver with smooth contours. Patent portal vein. No gallbladder wall thickening or gross biliary duct distension. Area of hyperenhancement along the margin of the RIGHT hemi liver away from the gallbladder fossa measures approximately 1.8 cm greatest axial and coronal dimension (image 49/2) Pancreas: Normal, without mass, inflammation or ductal dilatation. Spleen: Normal. Adrenals/Urinary Tract: Adrenal glands are normal. Area of hypodensity in the lower pole the RIGHT kidney with loss of normal corticomedullary distinction on image 46 of series 2 involving the lower anterior RIGHT kidney. No change in contour associated with this finding at this time. Renal vein is patent. Normal appearance of the LEFT kidney. No hydronephrosis. No perinephric stranding. Marked stranding about the urinary bladder which is moderate lead to markedly distended which extends up into the lower abdomen. Stomach/Bowel: Gastric and esophageal thickening, diffuse thickening is suggested on the current study. No signs of small bowel obstruction. The appendix is normal. Colon is decompressed. Vascular/Lymphatic: Normal caliber of the abdominal aorta. Patent abdominal vessels as well as can be assessed on early venous phase. There is no gastrohepatic or hepatoduodenal ligament lymphadenopathy. No retroperitoneal or mesenteric lymphadenopathy. Mildly prominent pelvic lymph nodes are unchanged including bilateral inguinal lymph nodes since previous imaging. These are likely reactive. Reproductive: Unremarkable by CT. Other: Small volume fluid in the abdomen in the pelvis. Diffuse body wall edema. Diffuse mesenteric edema.  Small fat containing umbilical hernia. Musculoskeletal: No acute musculoskeletal process. No destructive bone finding. IMPRESSION: Marked anasarca which has developed since the previous study. Patchy bilateral ground-glass and subpleural banding.  Findings are suggestive of atypical pulmonary infection. Correlate with respiratory symptoms and any history of COVID-19 infection in particular. Signs of suspected focal nephritis involving the lower  pole the RIGHT kidney. This is seen in the context of markedly distended and somewhat thickened urinary bladder. Would correlate with signs of cystitis and urinalysis as well with signs of bladder outlet obstruction. Findings in the RIGHT kidney are worse than on previous imaging and persistent since prior study of August. For this reason would suggest follow-up to exclude underlying mass in this location. Focal area of hyperenhancement in the RIGHT hemi liver perhaps related to vascular phenomenon given focal appearance lesion not excluded, underlying would consider abdominal sonogram for follow-up of both renal and liver findings. This would be best performed outside of the acute setting. Findings that suggest esophagitis and gastritis. Normal appendix. Electronically Signed   By: Zetta Bills M.D.   On: 08/12/2021 16:56   DG Chest Port 1 View  Result Date: 08/11/2021 CLINICAL DATA:  Chest pain, generalized edema throughout phase abdomen limbs. 25 year old female. EXAM: PORTABLE CHEST 1 VIEW COMPARISON:  August 06, 2021. FINDINGS: Trachea midline. Cardiomediastinal contours and hilar structures grossly stable. EKG leads project over the chest. Patchy basilar predominant opacities in the chest have developed since the previous study worse on the RIGHT than the LEFT. No sign of lobar consolidative process. No effusion on frontal radiograph. No sign of pneumothorax. On limited assessment no acute skeletal process. IMPRESSION: Patchy basilar predominant opacities in the chest have developed since the previous study. Suspicious for multifocal pneumonia. Electronically Signed   By: Zetta Bills M.D.   On: 08/11/2021 15:02   ECHOCARDIOGRAM COMPLETE  Result Date: 08/12/2021    ECHOCARDIOGRAM REPORT   Patient  Name:   MONESHA MONREAL Date of Exam: 08/12/2021 Medical Rec #:  [id number]        Height:       59.0 in Accession #:    [id number]       Weight:       142.9 lb Date of Birth:  Oct 30, 1996        BSA:          1.599 m Patient Age:    25 years         BP:           112/74 mmHg Patient Gender: F                HR:           99 bpm. Exam Location:  [facility] Procedure: 2D Echo, Cardiac Doppler and Color Doppler Indications:    R07.9* Chest pain, unspecified  History:        Patient has prior history of Echocardiogram examinations, most                 recent 03/09/2021. Stroke; Risk Factors:Hypertension and Diabetes.  Sonographer:    Bernadene Person RDCS Referring Phys: [phone] Persais Ethridge IMPRESSIONS  1. Left ventricular ejection fraction, by estimation, is 60 to 65%. The left ventricle has normal function. The left ventricle has no regional wall motion abnormalities. There is mild left ventricular hypertrophy. Left ventricular diastolic parameters were normal.  2. Right ventricular systolic function is normal. The right ventricular size is normal. There is normal pulmonary artery systolic pressure.  3. The mitral valve is normal in structure. No evidence of mitral valve regurgitation. No evidence of mitral stenosis.  4. The aortic valve is tricuspid. Aortic valve regurgitation is not visualized. No aortic stenosis is present.  5. The inferior vena cava is normal in size with greater than 50% respiratory variability, suggesting right atrial pressure of  3 mmHg. FINDINGS  Left Ventricle: Left ventricular ejection fraction, by estimation, is 60 to 65%. The left ventricle has normal function. The left ventricle has no regional wall motion abnormalities. The left ventricular internal cavity size was normal in size. There is  mild left ventricular hypertrophy. Left ventricular diastolic parameters were normal. Right Ventricle: The right ventricular size is normal. Right ventricular systolic function is normal. There is normal  pulmonary artery systolic pressure. The tricuspid regurgitant velocity is 2.03 m/s, and with an assumed right atrial pressure of 3 mmHg,  the estimated right ventricular systolic pressure is 24.2 mmHg. Left Atrium: Left atrial size was normal in size. Right Atrium: Right atrial size was normal in size. Pericardium: Trivial pericardial effusion is present. Mitral Valve: The mitral valve is normal in structure. No evidence of mitral valve regurgitation. No evidence of mitral valve stenosis. Tricuspid Valve: The tricuspid valve is normal in structure. Tricuspid valve regurgitation is trivial. No evidence of tricuspid stenosis. Aortic Valve: The aortic valve is tricuspid. Aortic valve regurgitation is not visualized. No aortic stenosis is present. Aortic valve mean gradient measures 3.0 mmHg. Aortic valve peak gradient measures 3.5 mmHg. Pulmonic Valve: The pulmonic valve was normal in structure. Pulmonic valve regurgitation is not visualized. No evidence of pulmonic stenosis. Aorta: The aortic root is normal in size and structure. Venous: The inferior vena cava is normal in size with greater than 50% respiratory variability, suggesting right atrial pressure of 3 mmHg. IAS/Shunts: No atrial level shunt detected by color flow Doppler.  LEFT VENTRICLE PLAX 2D LVIDd:         3.43 cm  Diastology LVIDs:         2.15 cm  LV e' lateral:   11.70 cm/s LV PW:         1.12 cm  LV E/e' lateral: 5.8 LV IVS:        0.98 cm LVOT diam:     1.60 cm LVOT Area:     2.01 cm  RIGHT VENTRICLE RV S prime:     12.70 cm/s TAPSE (M-mode): 1.8 cm LEFT ATRIUM             Index       RIGHT ATRIUM           Index LA diam:        2.60 cm 1.63 cm/m  RA Area:     12.30 cm LA Vol (A2C):   20.3 ml 12.70 ml/m RA Volume:   26.40 ml  16.51 ml/m LA Vol (A4C):   18.6 ml 11.64 ml/m LA Biplane Vol: 20.9 ml 13.07 ml/m  AORTIC VALVE AV Vmax:      94.00 cm/s AV Vmean:     79.500 cm/s AV VTI:       0.162 m AV Peak Grad: 3.5 mmHg AV Mean Grad: 3.0 mmHg  AORTA  Ao Root diam: 2.40 cm Ao Asc diam:  2.50 cm MITRAL VALVE               TRICUSPID VALVE MV Area (PHT): 6.12 cm    TR Peak grad:   16.5 mmHg MV Decel Time: 124 msec    TR Vmax:        203.00 cm/s MV E velocity: 67.90 cm/s MV A velocity: 66.20 cm/s  SHUNTS MV E/A ratio:  1.03        Systemic Diam: 1.60 cm Kirk Ruths MD Electronically signed by Kirk Ruths MD Signature Date/Time: 08/12/2021/12:32:31 PM    Final  Orson Eva, DO  [facility]  If 7PM-7AM, please contact night-coverage [email or web address] Password [email or web address] 08/13/2021, 10:42 AM   LOS: 2 days

## 2021-08-13 NOTE — Progress Notes (Signed)
Inpatient Diabetes Program Recommendations  AACE/ADA: New Consensus Statement on Inpatient Glycemic Control (2015)  Target Ranges:  Prepandial:   less than 140 mg/dL      Peak postprandial:   less than 180 mg/dL (1-2 hours)      Critically ill patients:  140 - 180 mg/dL   Lab Results  Component Value Date   GLUCAP 254 (H) 08/13/2021   HGBA1C 13.5 (H) 08/11/2021   Diabetes history: DM 1 Outpatient Diabetes medications:  Lantus 25 units q HS Apidra 5-20 units tid ac Current orders for Inpatient glycemic control:  Semglee 20 units q HS Novolog 4 units tid with meals  Novolog moderate tid with meals and HS  Inpatient Diabetes Program Recommendations:    Please reduce Novolog meal coverage to 3 units tid with meals and reduce Novolog correction to sensitive tid with meals.   Thanks,  Adah Perl, RN, BC-ADM Inpatient Diabetes Coordinator Pager [phone]  (8a-5p)

## 2021-08-14 LAB — MAGNESIUM: Magnesium: 2.1 mg/dL (ref 1.7–2.4)

## 2021-08-14 LAB — BASIC METABOLIC PANEL
Anion gap: 7 (ref 5–15)
BUN: 10 mg/dL (ref 6–20)
CO2: 28 mmol/L (ref 22–32)
Calcium: 8.3 mg/dL — ABNORMAL LOW (ref 8.9–10.3)
Chloride: 101 mmol/L (ref 98–111)
Creatinine, Ser: 1.21 mg/dL — ABNORMAL HIGH (ref 0.44–1.00)
GFR, Estimated: >60 mL/min (ref >60)
Glucose, Bld: 106 mg/dL — ABNORMAL HIGH (ref 70–99)
Potassium: 4.1 mmol/L (ref 3.5–5.1)
Sodium: 136 mmol/L (ref 135–145)

## 2021-08-14 LAB — GLUCOSE, CAPILLARY
Glucose-Capillary: 118 mg/dL — ABNORMAL HIGH (ref 70–99)
Glucose-Capillary: 173 mg/dL — ABNORMAL HIGH (ref 70–99)
Glucose-Capillary: 79 mg/dL (ref 70–99)
Glucose-Capillary: 70 mg/dL (ref 70–99)
Glucose-Capillary: 77 mg/dL (ref 70–99)
Glucose-Capillary: 67 mg/dL — ABNORMAL LOW (ref 70–99)
Glucose-Capillary: 120 mg/dL — ABNORMAL HIGH (ref 70–99)

## 2021-08-14 LAB — PROCALCITONIN: Procalcitonin: 4.89 ng/mL

## 2021-08-14 LAB — CBC
HCT: 31 % — ABNORMAL LOW (ref 36.0–46.0)
Hemoglobin: 10 g/dL — ABNORMAL LOW (ref 12.0–15.0)
MCH: 29.4 pg (ref 26.0–34.0)
MCHC: 32.3 g/dL (ref 30.0–36.0)
MCV: 91.2 fL (ref 80.0–100.0)
Platelets: 361 10*3/uL (ref 150–400)
RBC: 3.4 MIL/uL — ABNORMAL LOW (ref 3.87–5.11)
RDW: 12.3 % (ref 11.5–15.5)
WBC: 7.1 10*3/uL (ref 4.0–10.5)
nRBC: 0 % (ref 0.0–0.2)

## 2021-08-14 LAB — URINE CULTURE: Culture: NO GROWTH

## 2021-08-14 LAB — LACTIC ACID, PLASMA: Lactic Acid, Venous: 1.5 mmol/L (ref 0.5–1.9)

## 2021-08-14 MED ORDER — INSULIN ASPART 100 UNIT/ML IJ SOLN
0.0000 [IU] | Freq: Every day | INTRAMUSCULAR | Status: DC
Start: 2021-08-14 — End: 2021-08-15

## 2021-08-14 MED ORDER — LACTATED RINGERS IV SOLN
INTRAVENOUS | Status: AC
Start: 2021-08-14 — End: 2021-08-15

## 2021-08-14 MED ORDER — LACTATED RINGERS IV BOLUS
1000.0000 mL | Freq: Once | INTRAVENOUS | Status: AC
  Administered 2021-08-14: 1000 mL via INTRAVENOUS

## 2021-08-14 MED ORDER — LACTATED RINGERS IV BOLUS: 500.0000 mL | Freq: Once | INTRAVENOUS | Status: DC

## 2021-08-14 MED ORDER — INSULIN ASPART 100 UNIT/ML IJ SOLN
2.0000 [IU] | Freq: Three times a day (TID) | INTRAMUSCULAR | Status: DC
  Administered 2021-08-14 – 2021-08-16 (×5): 2 [IU] via SUBCUTANEOUS

## 2021-08-14 MED ORDER — SODIUM CHLORIDE 0.9% FLUSH: 10.0000 mL | INTRAVENOUS | Status: DC | PRN

## 2021-08-14 MED ORDER — SODIUM CHLORIDE 0.9% FLUSH
10.0000 mL | Freq: Two times a day (BID) | INTRAVENOUS | Status: DC
  Administered 2021-08-14 – 2021-08-16 (×2): 10 mL

## 2021-08-14 MED ORDER — INSULIN ASPART 100 UNIT/ML IJ SOLN
0.0000 [IU] | Freq: Three times a day (TID) | INTRAMUSCULAR | Status: DC
  Administered 2021-08-14 – 2021-08-15 (×2): 2 [IU] via SUBCUTANEOUS

## 2021-08-14 NOTE — Progress Notes (Signed)
1918:  Pt c/o nausea and that she threw up her dinner.  Zofran given per orders.  2020:  Pt up to the toilet at this time.  She complaining that she does not feel good.  Asked her if her blood sugar felt low and she stated no.  Checked blood sugar and the result was 67.  Pt refused to drink any juice and stated that her stomach was too bloated to eat or drink anything.  Dextrose 1/2 amp given due to pt being symptomatic.   2130:  Pt now requesting food to eat.  I encouraged pt to eat slowly so she does not get nauseated again.

## 2021-08-14 NOTE — Progress Notes (Signed)
PROGRESS NOTE  Diamond [name] [name]:[id number] DOB: 1996/06/02 DOA: 08/11/2021 PCP: [name], PA-C    Brief History:   25 y.o. female with medical history of  DM type 1, bipolar disorder, anasarca, Crohn's Disease, and hypertension, MSSA bactermia, nephrotic syndrome presenting nausea and vomiting that began on the evening of 08/10/2021.  The patient checked her blood sugar at that time, and she stated that it was in the 60s.  She drank some orange juice and subsequently developed abdominal pain with nausea and vomiting.  She felt like she would just sleep it off.  However when she woke up the morning she continued to have some nausea and abdominal pain.  She continued to have nausea and vomiting.  She developed chest pain or shortness of breath on the morning of 08/11/2021.  She also developed blurry vision bilateral eyes.  As result, the patient presented for further evaluation.  She endorses compliance with all her medications including her insulin however review of the medical record shows that the patient has a long history of poor compliance. She denies any fevers, chills, headache, neck pain, diarrhea, hematochezia, melena. In the emergency department, the patient had low-grade temperature of 99.7 F.  She was tachycardic up to 115.  She was hemodynamically stable.  Oxygen saturation was 86% on room air.  She was placed on 4 L with saturation up to 96%.  BMP shows sodium 130, potassium 4.8, chloride 96, CO2 94, anion gap 10.  Serum glucose was 1008.  Serum creatinine 1.37.  LFTs were unremarkable.  Lipase 41.  WBC 10.3, hemoglobin 14.7 platelets 289,000.  Initial troponin was 72.  EKG shows sinus tachycardia with nonspecific T wave changes which were unchanged from previous EKGs.  BNP was 192.  Chest x-ray showed bibasilar opacities, right greater than left.  The patient was started on levofloxacin.   Assessment/Plan: Hyperosmolar state -patient started on IV insulin with q 1 hour  CBG check and q 4 hour BMPs -pt started on aggressive fluid resuscitation -Electrolytes were monitored and repleted -transitioned to [place] insulin once anion gap closed -diet was advanced once anion gap closed -08/11/21 A1C--13.5 -increase Semglee to 20 units   Acute respiratory failure with hypoxia -Secondary to pneumonia -Stable on 4 L nasal cannula>>weaned to RA   Lobar pneumonia -Check procalcitonin--11.42 -Continue levofloxacin -low grad fever 100.4 in am 08/14/21  Bacteremia -contaminant--S. 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 This is seen in the context of markedly distended and somewhat thickened urinary bladder;  Focal area of hyperenhancement in the RIGHT hemi liver perhaps related to vascular phenomenon given focal appearance   Essential Hypertension -HCTZ, metoprolol, hydralazine initially held due to soft BPs -restart metoprolol, hydralazine, imdur -patient has been refusing her anti-HTN meds during the hospitalization   Atypical chest pain/elevated troponin -Cycle troponins 72>>106>>132>>125>>110 -9/9 Echo EF 60-65%, no WMA -Personally reviewed EKG--sinus tachycardia, nonspecific T wave changes -no chest pain presently -due to demand ischemia  Bipolar disorder -Previously taking trazodone and Celexa -It is unclear whether the patient is still taking these   Hyponatremia -Secondary to  hyperglycemia -resolved           Status is: Inpatient   Remains inpatient appropriate because:Inpatient level of care appropriate due to severity of illness   Dispo: The patient is from: Home              Anticipated d/c is to: Home              Patient currently is not medically stable to d/c.              Difficult to place patient No               Family Communication:  no Family at bedside   Consultants:  none   Code Status:  FULL   DVT Prophylaxis: Coldiron Lovenox     Procedures: As Listed in Progress Note Above   Antibiotics: Levoflox 9/9>>   Subjective: Patient had an episode of n/v last night.  Denies f/c, cp, sob, diarrhea.  She had a BM last night  Objective: Vitals:   08/14/21 0400 08/14/21 0500 08/14/21 0600 08/14/21 0700  BP:      Pulse:      Resp: 13 20 15    Temp:    (!) 100.4 F (38 C)  TempSrc:    Oral  SpO2:      Weight:      Height:        Intake/Output Summary (Last 24 hours) at 08/14/2021 0854 Last data filed at 08/14/2021 0800 Gross per 24 hour  Intake 1582.22 ml  Output 2800 ml  Net -1217.78 ml   Weight change:  Exam:  General:  Pt is alert, follows commands appropriately, not in acute distress HEENT: No icterus, No thrush, No neck mass, [place]/AT Cardiovascular: RRR, S1/S2, no rubs, no gallops Respiratory: bibasilar crackles. No wheeze Abdomen: Soft/+BS, non tender, non distended, no guarding Extremities: 1+ LE edema, No lymphangitis, No petechiae, No rashes, no synovitis   Data Reviewed: I have personally reviewed following labs and imaging studies Basic Metabolic Panel: Recent Labs  Lab 08/12/21 0120 08/12/21 0400 08/12/21 0858 08/13/21 0345 08/14/21 0448  NA 135 133* 136 132* 136  K 4.0 4.0 4.0 3.9 4.1  CL 103 104 104 99 101  CO2 26 26 27 26 28   GLUCOSE 216* 127* 99 249* 106*  BUN 19 20 18 12 10   CREATININE 1.11* 1.08* 1.12* 1.10* 1.21*  CALCIUM 8.2* 7.9* 8.3* 8.0* 8.3*  MG  --   --   --  1.7 2.1   Liver  Function Tests: Recent Labs  Lab 08/11/21 1451 08/13/21 0345  AST 14* 20  ALT 16 11  ALKPHOS 109 76  BILITOT 1.0 0.5  PROT 7.1 5.5*  ALBUMIN 2.7* 1.8*   Recent Labs  Lab 08/11/21 1451  LIPASE 41   No results for input(s): AMMONIA in the last 168 hours. Coagulation Profile: No results for input(s): INR, PROTIME in the last 168 hours. CBC: Recent Labs  Lab 08/11/21 1451 08/13/21 0345 08/14/21 0448  WBC 10.3 7.7 7.1  NEUTROABS 9.2*  --   --   HGB 14.7 10.4* 10.0*  HCT 45.8 32.1* 31.0*  MCV 93.5 91.5 91.2  PLT 289 356 361   Cardiac Enzymes: No results for input(s): CKTOTAL, CKMB, CKMBINDEX, TROPONINI in the last 168 hours. BNP: Invalid input(s): POCBNP CBG: Recent Labs  Lab 08/13/21 1331  08/13/21 1511 08/13/21 1938 08/13/21 2207 08/14/21 0723  GLUCAP 95 82 101* 100* 118*   HbA1C: Recent Labs    08/11/21 1845  HGBA1C 13.5*   Urine analysis:    Component Value Date/Time   COLORURINE YELLOW 08/12/2021 2249   APPEARANCEUR CLEAR 08/12/2021 2249   APPEARANCEUR Clear 11/15/2020 1531   LABSPEC 1.010 08/12/2021 2249   PHURINE 6.5 08/12/2021 2249   GLUCOSEU 250 (A) 08/12/2021 2249   HGBUR SMALL (A) 08/12/2021 2249   BILIRUBINUR NEGATIVE 08/12/2021 2249   BILIRUBINUR NEG 12/05/2020 1353   BILIRUBINUR Negative 11/15/2020 1531   KETONESUR NEGATIVE 08/12/2021 2249   PROTEINUR 100 (A) 08/12/2021 2249   UROBILINOGEN 0.2 12/05/2020 1353   UROBILINOGEN 0.2 09/13/2012 2014   NITRITE NEGATIVE 08/12/2021 2249   LEUKOCYTESUR NEGATIVE 08/12/2021 2249   Sepsis Labs: @LABRCNTIP (procalcitonin:4,lacticidven:4) ) Recent Results (from the past 240 hour(s))  Blood culture (routine x 2)     Status: Abnormal   Collection Time: 08/11/21  5:36 PM   Specimen: Site Not Specified; Blood  Result Value Ref Range Status   Specimen Description   Final    SITE NOT SPECIFIED Performed at [facility], [address] [place], [place] [address]    Special Requests   Final     Blood Culture adequate volume BOTTLES DRAWN AEROBIC ONLY Performed at [facility], [address] [place], [place] [address]    Culture  Setup Time   Final    GRAM POSITIVE COCCI AEROBIC BOTTLE ONLY Gram Stain Report Called to,Read Back By and Verified With: COGER @ 4818 ON [id number] BY HENDERSON L CRITICAL RESULT CALLED TO, READ BACK BY AND VERIFIED WITH: L,HILTON RN @1826  08/12/21 EB    Culture (A)  Final    STAPHYLOCOCCUS HAEMOLYTICUS THE SIGNIFICANCE OF ISOLATING THIS ORGANISM FROM A SINGLE SET OF BLOOD CULTURES WHEN MULTIPLE SETS ARE DRAWN IS UNCERTAIN. PLEASE NOTIFY THE MICROBIOLOGY DEPARTMENT WITHIN ONE WEEK IF SPECIATION AND SENSITIVITIES ARE REQUIRED. Performed at [facility], [place] [address] [place], [place] [address]    Report Status 08/13/2021 FINAL  Final  Blood culture (routine x 2)     Status: None (Preliminary result)   Collection Time: 08/11/21  5:36 PM   Specimen: Site Not Specified; Blood  Result Value Ref Range Status   Specimen Description SITE NOT SPECIFIED  Final   Special Requests   Final    Blood Culture adequate volume BOTTLES DRAWN AEROBIC AND ANAEROBIC   Culture   Final    NO GROWTH 2 DAYS Performed at [facility], [address], [place], [place] [address]    Report Status PENDING  Incomplete  Blood Culture ID Panel (Reflexed)     Status: Abnormal   Collection Time: 08/11/21  5:36 PM  Result Value Ref Range Status   Enterococcus faecalis NOT DETECTED NOT DETECTED Final   Enterococcus Faecium NOT DETECTED NOT DETECTED Final   Listeria monocytogenes NOT DETECTED NOT DETECTED Final   Staphylococcus species DETECTED (A) NOT DETECTED Final    Comment: CRITICAL RESULT CALLED TO, READ BACK BY AND VERIFIED WITH: L,HILTON RN @1826  08/12/21 EB    Staphylococcus aureus (BCID) NOT DETECTED NOT DETECTED Final   Staphylococcus epidermidis NOT DETECTED NOT DETECTED Final   Staphylococcus lugdunensis NOT DETECTED NOT DETECTED Final   Streptococcus species NOT  DETECTED NOT DETECTED Final   Streptococcus agalactiae NOT DETECTED NOT DETECTED Final   Streptococcus pneumoniae NOT DETECTED NOT DETECTED Final   Streptococcus pyogenes NOT DETECTED NOT DETECTED Final  A.calcoaceticus-baumannii NOT DETECTED NOT DETECTED Final   Bacteroides fragilis NOT DETECTED NOT DETECTED Final   Enterobacterales NOT DETECTED NOT DETECTED Final   Enterobacter cloacae complex NOT DETECTED NOT DETECTED Final   Escherichia coli NOT DETECTED NOT DETECTED Final   Klebsiella aerogenes NOT DETECTED NOT DETECTED Final   Klebsiella oxytoca NOT DETECTED NOT DETECTED Final   Klebsiella pneumoniae NOT DETECTED NOT DETECTED Final   Proteus species NOT DETECTED NOT DETECTED Final   Salmonella species NOT DETECTED NOT DETECTED Final   Serratia marcescens NOT DETECTED NOT DETECTED Final   Haemophilus influenzae NOT DETECTED NOT DETECTED Final   Neisseria meningitidis NOT DETECTED NOT DETECTED Final   Pseudomonas aeruginosa NOT DETECTED NOT DETECTED Final   Stenotrophomonas maltophilia NOT DETECTED NOT DETECTED Final   Candida albicans NOT DETECTED NOT DETECTED Final   Candida auris NOT DETECTED NOT DETECTED Final   Candida glabrata NOT DETECTED NOT DETECTED Final   Candida krusei NOT DETECTED NOT DETECTED Final   Candida parapsilosis NOT DETECTED NOT DETECTED Final   Candida tropicalis NOT DETECTED NOT DETECTED Final   Cryptococcus neoformans/gattii NOT DETECTED NOT DETECTED Final    Comment: Performed at [facility], [place] [address] [place], [place] [address]  Resp Panel by RT-PCR (Flu A&B, Covid) Nasal Mucosa     Status: None   Collection Time: 08/11/21  6:30 PM   Specimen: Nasal Mucosa; Nasopharyngeal(NP) swabs in vial transport medium  Result Value Ref Range Status   SARS Coronavirus 2 by RT PCR NEGATIVE NEGATIVE Final    Comment: (NOTE) SARS-CoV-2 target nucleic acids are NOT DETECTED.  The SARS-CoV-2 RNA is generally detectable in upper respiratory specimens  during the acute phase of infection. The lowest concentration of SARS-CoV-2 viral copies this assay can detect is 138 copies/mL. A negative result does not preclude SARS-Cov-2 infection and should not be used as the sole basis for treatment or other patient management decisions. A negative result may occur with  improper specimen collection/handling, submission of specimen other than nasopharyngeal swab, presence of viral mutation(s) within the areas targeted by this assay, and inadequate number of viral copies(<138 copies/mL). A negative result must be combined with clinical observations, patient history, and epidemiological information. The expected result is Negative.  Fact Sheet for Patients:  EntrepreneurPulse.com.au  Fact Sheet for Healthcare Providers:  IncredibleEmployment.be  This test is no t yet approved or cleared by the [place] FDA and  has been authorized for detection and/or diagnosis of SARS-CoV-2 by FDA under an Emergency Use Authorization (EUA). This EUA will remain  in effect (meaning this test can be used) for the duration of the COVID-19 declaration under Section 564(b)(1) of the Act, 21 U.S.C.section 360bbb-3(b)(1), unless the authorization is terminated  or revoked sooner.       Influenza A by PCR NEGATIVE NEGATIVE Final   Influenza B by PCR NEGATIVE NEGATIVE Final    Comment: (NOTE) The Xpert Xpress SARS-CoV-2/FLU/RSV plus assay is intended as an aid in the diagnosis of influenza from Nasopharyngeal swab specimens and should not be used as a sole basis for treatment. Nasal washings and aspirates are unacceptable for Xpert Xpress SARS-CoV-2/FLU/RSV testing.  Fact Sheet for Patients: EntrepreneurPulse.com.au  Fact Sheet for Healthcare Providers: IncredibleEmployment.be  This test is not yet approved or cleared by the [place] FDA and has been authorized for detection  and/or diagnosis of SARS-CoV-2 by FDA under an Emergency Use Authorization (EUA). This EUA will remain in effect (meaning this test can be used)  for the duration of the COVID-19 declaration under Section 564(b)(1) of the Act, 21 U.S.C. section 360bbb-3(b)(1), unless the authorization is terminated or revoked.  Performed at [facility], [address] [place], [place] [address]   MRSA Next Gen by PCR, Nasal     Status: None   Collection Time: 08/11/21  6:36 PM   Specimen: Nasal Mucosa; Nasal Swab  Result Value Ref Range Status   MRSA by PCR Next Gen NOT DETECTED NOT DETECTED Final    Comment: (NOTE) The GeneXpert MRSA Assay (FDA approved for NASAL specimens only), is one component of a comprehensive MRSA colonization surveillance program. It is not intended to diagnose MRSA infection nor to guide or monitor treatment for MRSA infections. Test performance is not FDA approved in patients less than 53 years old. Performed at [facility], [address], [place], [place] [address]   Urine Culture     Status: None   Collection Time: 08/12/21 10:49 PM   Specimen: Urine, Clean Catch  Result Value Ref Range Status   Specimen Description   Final    URINE, CLEAN CATCH Performed at [facility], [address], [place], [place] [address]    Special Requests   Final    NONE Performed at [facility], [address] [place], [place] [address]    Culture   Final    NO GROWTH Performed at [facility], [place] [address] [place], [place] [address]    Report Status 08/14/2021 FINAL  Final     Scheduled Meds:  Chlorhexidine Gluconate Cloth  6 each Topical Daily   enoxaparin (LOVENOX) injection  40 mg Subcutaneous Q24H   hydrALAZINE  25 mg Oral Q8H   insulin aspart  0-5 Units Subcutaneous QHS   insulin aspart  0-9 Units Subcutaneous TID WC   insulin glargine-yfgn  20 Units Subcutaneous Daily   isosorbide mononitrate  30 mg Oral Daily   lidocaine  1 patch Transdermal Q24H    metoprolol tartrate  50 mg Oral BID   pantoprazole  40 mg Oral Daily   Continuous Infusions:  lactated ringers     lactated ringers     levofloxacin (LEVAQUIN) IV Stopped (08/13/21 1228)    Procedures/Studies: CT ABDOMEN PELVIS W CONTRAST  Result Date: 08/12/2021 CLINICAL DATA:  Acute nonlocalized abdominal pain in this patient with history of Crohn's disease with chronic epigastric and pelvic pain also with nausea and vomiting that began on 08/10/2021. EXAM: CT ABDOMEN AND PELVIS WITH CONTRAST TECHNIQUE: Multidetector CT imaging of the abdomen and pelvis was performed using the standard protocol following bolus administration of intravenous contrast. CONTRAST:  67m OMNIPAQUE IOHEXOL 350 MG/ML SOLN COMPARISON:  Comparison made with July 18, 2021. FINDINGS: Lower chest: Small bilateral pleural effusions have developed since the prior study along with patchy areas of ground-glass opacity in the chest. Hepatobiliary: Liver with smooth contours. Patent portal vein. No gallbladder wall thickening or gross biliary duct distension. Area of hyperenhancement along the margin of the RIGHT hemi liver away from the gallbladder fossa measures approximately 1.8 cm greatest axial and coronal dimension (image 49/2) Pancreas: Normal, without mass, inflammation or ductal dilatation. Spleen: Normal. Adrenals/Urinary Tract: Adrenal glands are normal. Area of hypodensity in the lower pole the RIGHT kidney with loss of normal corticomedullary distinction on image 46 of series 2 involving the lower anterior RIGHT kidney. No change in contour associated with this finding at this time. Renal vein is patent. Normal appearance of the LEFT kidney. No hydronephrosis. No  perinephric stranding. Marked stranding about the urinary bladder which is moderate lead to markedly distended which extends up into the lower abdomen. Stomach/Bowel: Gastric and esophageal thickening, diffuse thickening is suggested on the current study. No signs  of small bowel obstruction. The appendix is normal. Colon is decompressed. Vascular/Lymphatic: Normal caliber of the abdominal aorta. Patent abdominal vessels as well as can be assessed on early venous phase. There is no gastrohepatic or hepatoduodenal ligament lymphadenopathy. No retroperitoneal or mesenteric lymphadenopathy. Mildly prominent pelvic lymph nodes are unchanged including bilateral inguinal lymph nodes since previous imaging. These are likely reactive. Reproductive: Unremarkable by CT. Other: Small volume fluid in the abdomen in the pelvis. Diffuse body wall edema. Diffuse mesenteric edema.  Small fat containing umbilical hernia. Musculoskeletal: No acute musculoskeletal process. No destructive bone finding. IMPRESSION: Marked anasarca which has developed since the previous study. Patchy bilateral ground-glass and subpleural banding. Findings are suggestive of atypical pulmonary infection. Correlate with respiratory symptoms and any history of COVID-19 infection in particular. Signs of suspected focal nephritis involving the lower pole the RIGHT kidney. This is seen in the context of markedly distended and somewhat thickened urinary bladder. Would correlate with signs of cystitis and urinalysis as well with signs of bladder outlet obstruction. Findings in the RIGHT kidney are worse than on previous imaging and persistent since prior study of August. For this reason would suggest follow-up to exclude underlying mass in this location. Focal area of hyperenhancement in the RIGHT hemi liver perhaps related to vascular phenomenon given focal appearance lesion not excluded, underlying would consider abdominal sonogram for follow-up of both renal and liver findings. This would be best performed outside of the acute setting. Findings that suggest esophagitis and gastritis. Normal appendix. Electronically Signed   By: Zetta Bills M.D.   On: 08/12/2021 16:56   DG Chest Port 1 View  Result Date:  08/11/2021 CLINICAL DATA:  Chest pain, generalized edema throughout phase abdomen limbs. 25 year old female. EXAM: PORTABLE CHEST 1 VIEW COMPARISON:  August 06, 2021. FINDINGS: Trachea midline. Cardiomediastinal contours and hilar structures grossly stable. EKG leads project over the chest. Patchy basilar predominant opacities in the chest have developed since the previous study worse on the RIGHT than the LEFT. No sign of lobar consolidative process. No effusion on frontal radiograph. No sign of pneumothorax. On limited assessment no acute skeletal process. IMPRESSION: Patchy basilar predominant opacities in the chest have developed since the previous study. Suspicious for multifocal pneumonia. Electronically Signed   By: Zetta Bills M.D.   On: 08/11/2021 15:02   ECHOCARDIOGRAM COMPLETE  Result Date: 08/12/2021    ECHOCARDIOGRAM REPORT   Patient Name:   KAWENA LYDAY Date of Exam: 08/12/2021 Medical Rec #:  [id number]        Height:       59.0 in Accession #:    [id number]       Weight:       142.9 lb Date of Birth:  11-Feb-1996        BSA:          1.599 m Patient Age:    25 years         BP:           112/74 mmHg Patient Gender: F                HR:           99 bpm. Exam Location:  [facility] Procedure: 2D Echo, Cardiac Doppler and Color Doppler Indications:  R07.9* Chest pain, unspecified  History:        Patient has prior history of Echocardiogram examinations, most                 recent 03/09/2021. Stroke; Risk Factors:Hypertension and Diabetes.  Sonographer:    Bernadene Person RDCS Referring Phys: [phone] Dezzie Badilla IMPRESSIONS  1. Left ventricular ejection fraction, by estimation, is 60 to 65%. The left ventricle has normal function. The left ventricle has no regional wall motion abnormalities. There is mild left ventricular hypertrophy. Left ventricular diastolic parameters were normal.  2. Right ventricular systolic function is normal. The right ventricular size is normal. There is normal pulmonary  artery systolic pressure.  3. The mitral valve is normal in structure. No evidence of mitral valve regurgitation. No evidence of mitral stenosis.  4. The aortic valve is tricuspid. Aortic valve regurgitation is not visualized. No aortic stenosis is present.  5. The inferior vena cava is normal in size with greater than 50% respiratory variability, suggesting right atrial pressure of 3 mmHg. FINDINGS  Left Ventricle: Left ventricular ejection fraction, by estimation, is 60 to 65%. The left ventricle has normal function. The left ventricle has no regional wall motion abnormalities. The left ventricular internal cavity size was normal in size. There is  mild left ventricular hypertrophy. Left ventricular diastolic parameters were normal. Right Ventricle: The right ventricular size is normal. Right ventricular systolic function is normal. There is normal pulmonary artery systolic pressure. The tricuspid regurgitant velocity is 2.03 m/s, and with an assumed right atrial pressure of 3 mmHg,  the estimated right ventricular systolic pressure is 38.2 mmHg. Left Atrium: Left atrial size was normal in size. Right Atrium: Right atrial size was normal in size. Pericardium: Trivial pericardial effusion is present. Mitral Valve: The mitral valve is normal in structure. No evidence of mitral valve regurgitation. No evidence of mitral valve stenosis. Tricuspid Valve: The tricuspid valve is normal in structure. Tricuspid valve regurgitation is trivial. No evidence of tricuspid stenosis. Aortic Valve: The aortic valve is tricuspid. Aortic valve regurgitation is not visualized. No aortic stenosis is present. Aortic valve mean gradient measures 3.0 mmHg. Aortic valve peak gradient measures 3.5 mmHg. Pulmonic Valve: The pulmonic valve was normal in structure. Pulmonic valve regurgitation is not visualized. No evidence of pulmonic stenosis. Aorta: The aortic root is normal in size and structure. Venous: The inferior vena cava is normal  in size with greater than 50% respiratory variability, suggesting right atrial pressure of 3 mmHg. IAS/Shunts: No atrial level shunt detected by color flow Doppler.  LEFT VENTRICLE PLAX 2D LVIDd:         3.43 cm  Diastology LVIDs:         2.15 cm  LV e' lateral:   11.70 cm/s LV PW:         1.12 cm  LV E/e' lateral: 5.8 LV IVS:        0.98 cm LVOT diam:     1.60 cm LVOT Area:     2.01 cm  RIGHT VENTRICLE RV S prime:     12.70 cm/s TAPSE (M-mode): 1.8 cm LEFT ATRIUM             Index       RIGHT ATRIUM           Index LA diam:        2.60 cm 1.63 cm/m  RA Area:     12.30 cm LA Vol (A2C):   20.3 ml 12.70  ml/m RA Volume:   26.40 ml  16.51 ml/m LA Vol (A4C):   18.6 ml 11.64 ml/m LA Biplane Vol: 20.9 ml 13.07 ml/m  AORTIC VALVE AV Vmax:      94.00 cm/s AV Vmean:     79.500 cm/s AV VTI:       0.162 m AV Peak Grad: 3.5 mmHg AV Mean Grad: 3.0 mmHg  AORTA Ao Root diam: 2.40 cm Ao Asc diam:  2.50 cm MITRAL VALVE               TRICUSPID VALVE MV Area (PHT): 6.12 cm    TR Peak grad:   16.5 mmHg MV Decel Time: 124 msec    TR Vmax:        203.00 cm/s MV E velocity: 67.90 cm/s MV A velocity: 66.20 cm/s  SHUNTS MV E/A ratio:  1.03        Systemic Diam: 1.60 cm Kirk Ruths MD Electronically signed by Kirk Ruths MD Signature Date/Time: 08/12/2021/12:32:31 PM    Final     Orson Eva, DO  [facility]  If 7PM-7AM, please contact night-coverage [email or web address] Password [facility] 08/14/2021, 8:54 AM   LOS: 3 days

## 2021-08-14 NOTE — Progress Notes (Signed)
Inpatient Diabetes Program Recommendations  AACE/ADA: New Consensus Statement on Inpatient Glycemic Control (2015)  Target Ranges:  Prepandial:   less than 140 mg/dL      Peak postprandial:   less than 180 mg/dL (1-2 hours)      Critically ill patients:  140 - 180 mg/dL   Lab Results  Component Value Date   GLUCAP 118 (H) 08/14/2021   HGBA1C 13.5 (H) 08/11/2021    Review of Glycemic Control Results for Diamond [name], Diamond [name] (MRN [id number]) as of 08/14/2021 11:09  Ref. Range 08/13/2021 19:38 08/13/2021 22:07 08/14/2021 07:23  Glucose-Capillary Latest Ref Range: 70 - 99 mg/dL 101 (H) 100 (H) 118 (H)  Diabetes history: DM 1 Outpatient Diabetes medications:  Lantus 25 units q HS Apidra 5-20 units tid ac Current orders for Inpatient glycemic control:  Semglee 20 units q HS Novolog moderate tid with meals and HS  Inpatient Diabetes Program Recommendations:    Spoke with patient regarding outpatient diabetes management. Reports compliance with home medications, however, question details of patient's story. Reviewed patient's current A1c of 13.5%. Explained what a A1c is and what it measures. Also reviewed goal A1c with patient, importance of good glucose control @ home, and blood sugar goals. Reviewed need for insulin, role of basal insulin vs short acting, current inpatient glucose trends with similar outpatient dosages, impact of poor glycemic control, vascular changes and commorbidities. Patient states that she has a meter and supplies. However, asking for a new one because "it may not be there when I return home." Reports checking 3 times per day and that "it is always high" and fastings are always >200 mg/dL. Reviewed importance of reaching out to [facility] with CBGs consistently >180 mg/dL. Encouraged patient to make an appointment.  Denies drinking sugary beverages and that her aunt helps to prepare meals. Asked if I could call her aunt and further discuss her diabetes. She quickly said,  "No, don't bother her."  Encouraged patient to continue checking CBGs and even add a post prandial, call to make clinic appointment and take medications as prescribed. No further questions at this time.   Thanks, Bronson Curb, MSN, RNC-OB Diabetes Coordinator [phone] (8a-5p)

## 2021-08-15 ENCOUNTER — Encounter (HOSPITAL_COMMUNITY): Payer: Medicaid Other | Admitting: Physical Therapy

## 2021-08-15 ENCOUNTER — Telehealth (HOSPITAL_COMMUNITY): Payer: Self-pay | Admitting: Physical Therapy

## 2021-08-15 LAB — CBC
HCT: 29.8 % — ABNORMAL LOW (ref 36.0–46.0)
Hemoglobin: 9.6 g/dL — ABNORMAL LOW (ref 12.0–15.0)
MCH: 29.2 pg (ref 26.0–34.0)
MCHC: 32.2 g/dL (ref 30.0–36.0)
MCV: 90.6 fL (ref 80.0–100.0)
Platelets: 377 10*3/uL (ref 150–400)
RBC: 3.29 MIL/uL — ABNORMAL LOW (ref 3.87–5.11)
RDW: 12.2 % (ref 11.5–15.5)
WBC: 6.5 10*3/uL (ref 4.0–10.5)
nRBC: 0 % (ref 0.0–0.2)

## 2021-08-15 LAB — GLUCOSE, CAPILLARY
Glucose-Capillary: 167 mg/dL — ABNORMAL HIGH (ref 70–99)
Glucose-Capillary: 189 mg/dL — ABNORMAL HIGH (ref 70–99)
Glucose-Capillary: 105 mg/dL — ABNORMAL HIGH (ref 70–99)
Glucose-Capillary: 261 mg/dL — ABNORMAL HIGH (ref 70–99)
Glucose-Capillary: 78 mg/dL (ref 70–99)

## 2021-08-15 LAB — BASIC METABOLIC PANEL
Anion gap: 7 (ref 5–15)
BUN: 11 mg/dL (ref 6–20)
CO2: 27 mmol/L (ref 22–32)
Calcium: 7.5 mg/dL — ABNORMAL LOW (ref 8.9–10.3)
Chloride: 99 mmol/L (ref 98–111)
Creatinine, Ser: 1.42 mg/dL — ABNORMAL HIGH (ref 0.44–1.00)
GFR, Estimated: 53 mL/min — ABNORMAL LOW (ref >60)
Glucose, Bld: 165 mg/dL — ABNORMAL HIGH (ref 70–99)
Potassium: 4.4 mmol/L (ref 3.5–5.1)
Sodium: 133 mmol/L — ABNORMAL LOW (ref 135–145)

## 2021-08-15 LAB — MAGNESIUM: Magnesium: 2.1 mg/dL (ref 1.7–2.4)

## 2021-08-15 MED ORDER — SODIUM CHLORIDE 0.9 % IV SOLN: INTRAVENOUS | Status: AC

## 2021-08-15 MED ORDER — PANTOPRAZOLE SODIUM 40 MG PO TBEC
40.0000 mg | DELAYED_RELEASE_TABLET | Freq: Two times a day (BID) | ORAL | Status: DC
  Administered 2021-08-16: 40 mg via ORAL
  Filled 2021-08-15 (×2): qty 1

## 2021-08-15 MED ORDER — METOCLOPRAMIDE HCL 5 MG/ML IJ SOLN
5.0000 mg | Freq: Three times a day (TID) | INTRAMUSCULAR | Status: DC
  Administered 2021-08-15 – 2021-08-16 (×5): 5 mg via INTRAVENOUS
  Filled 2021-08-15 (×5): qty 2

## 2021-08-15 MED ORDER — INSULIN GLARGINE-YFGN 100 UNIT/ML ~~LOC~~ SOLN
16.0000 [IU] | Freq: Every day | SUBCUTANEOUS | Status: DC
  Administered 2021-08-16: 16 [IU] via SUBCUTANEOUS
  Filled 2021-08-15 (×2): qty 0.16

## 2021-08-15 MED ORDER — METOCLOPRAMIDE HCL 5 MG/ML IJ SOLN: 5.0000 mg | Freq: Three times a day (TID) | INTRAMUSCULAR | Status: DC

## 2021-08-15 MED ORDER — INSULIN ASPART 100 UNIT/ML IJ SOLN
0.0000 [IU] | Freq: Three times a day (TID) | INTRAMUSCULAR | Status: DC
  Administered 2021-08-15: 3 [IU] via SUBCUTANEOUS
  Administered 2021-08-16: 1 [IU] via SUBCUTANEOUS
  Administered 2021-08-16: 4 [IU] via SUBCUTANEOUS

## 2021-08-15 MED ORDER — INSULIN ASPART 100 UNIT/ML IJ SOLN: 0.0000 [IU] | Freq: Every day | INTRAMUSCULAR | Status: DC

## 2021-08-15 NOTE — Progress Notes (Addendum)
Inpatient Diabetes Program Recommendations  AACE/ADA: New Consensus Statement on Inpatient Glycemic Control (2015)  Target Ranges:  Prepandial:   less than 140 mg/dL      Peak postprandial:   less than 180 mg/dL (1-2 hours)      Critically ill patients:  140 - 180 mg/dL   Lab Results  Component Value Date   GLUCAP 189 (H) 08/15/2021   HGBA1C 13.5 (H) 08/11/2021    Review of Glycemic Control Results for Diamond [name], SOLIVAN (MRN [id number]) as of 08/15/2021 08:31  Ref. Range 08/14/2021 20:04 08/14/2021 20:39 08/15/2021 03:16 08/15/2021 07:46  Glucose-Capillary Latest Ref Range: 70 - 99 mg/dL 67 (L) 120 (H) 167 (H) 189 (H)   Diabetes history: DM 1 Outpatient Diabetes medications:  Lantus 25 units q HS Apidra 5-20 units tid ac Current orders for Inpatient glycemic control:  Semglee 20 units q HS Novolog moderate tid with meals and HS Novolog 2 units TID   Inpatient Diabetes Program Recommendations:  Noted mild hypoglycemia of 67 mg/dL. Consider reducing Semglee to 18 units QHS and correction to Novolog 0-6 units TID.   Thanks, Bronson Curb, MSN, RNC-OB Diabetes Coordinator [phone] (8a-5p)

## 2021-08-15 NOTE — Progress Notes (Addendum)
PROGRESS NOTE  MICHELYN [name] KLK:[id number] DOB: 12-14-1995 DOA: 08/11/2021 PCP: [name], PA-C  Brief History:   25 y.o. female with medical history of  DM type 1, bipolar disorder, anasarca, Crohn's Disease, and hypertension, MSSA bactermia, nephrotic syndrome presenting nausea and vomiting that began on the evening of 08/10/2021.  The patient checked her blood sugar at that time, and she stated that it was in the 60s.  She drank some orange juice and subsequently developed abdominal pain with nausea and vomiting.  She felt like she would just sleep it off.  However when she woke up the morning she continued to have some nausea and abdominal pain.  She continued to have nausea and vomiting.  She developed chest pain or shortness of breath on the morning of 08/11/2021.  She also developed blurry vision bilateral eyes.  As result, the patient presented for further evaluation.  She endorses compliance with all her medications including her insulin however review of the medical record shows that the patient has a long history of poor compliance. She denies any fevers, chills, headache, neck pain, diarrhea, hematochezia, melena. In the emergency department, the patient had low-grade temperature of 99.7 F.  She was tachycardic up to 115.  She was hemodynamically stable.  Oxygen saturation was 86% on room air.  She was placed on 4 L with saturation up to 96%.  BMP shows sodium 130, potassium 4.8, chloride 96, CO2 94, anion gap 10.  Serum glucose was 1008.  Serum creatinine 1.37.  LFTs were unremarkable.  Lipase 41.  WBC 10.3, hemoglobin 14.7 platelets 289,000.  Initial troponin was 72.  EKG shows sinus tachycardia with nonspecific T wave changes which were unchanged from previous EKGs.  BNP was 192.  Chest x-ray showed bibasilar opacities, right greater than left.  The patient was started on levofloxacin.   Assessment/Plan: Hyperosmolar state -patient started on IV insulin with q 1 hour CBG  check and q 4 hour BMPs -pt started on aggressive fluid resuscitation -Electrolytes were monitored and repleted -transitioned to Brusly insulin once anion gap closed -diet was advanced once anion gap closed -08/11/21 A1C--13.5 -change Semglee to 16 units   Acute respiratory failure with hypoxia -Secondary to pneumonia -Stable on 4 L nasal cannula>>weaned to RA   Lobar pneumonia -Check procalcitonin--11.42 -Continue levofloxacin -low grad fever 100.4 in am 08/14/21>>resolved  Nausea and Vomiting -due to exacerbation of underlying gastroparesis in setting of acute medical illness -down grade to full liquids -start reglan -increase protonix to bid -restart IVF  AKI -baseline creatinine 0.9-1.0 -serum creatinine up to 1.42 -due to volume depletion in the setting of n/v -restart IVF   Bacteremia -contaminant--S. 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 This is seen in the context of markedly distended and somewhat thickened urinary bladder;  Focal area of hyperenhancement in the RIGHT hemi liver perhaps related to vascular phenomenon given focal appearance   Essential Hypertension -HCTZ, metoprolol, hydralazine initially held due to  soft BPs -restart metoprolol, hydralazine, imdur -patient has been refusing her anti-HTN meds intermittently during the hospitalization   Atypical chest pain/elevated  troponin -Cycle troponins 72>>106>>132>>125>>110 -9/9 Echo EF 60-65%, no WMA -Personally reviewed EKG--sinus tachycardia, nonspecific T wave changes -no chest pain presently -due to demand ischemia -reproducible on exam by palpation   Bipolar disorder -Previously taking trazodone and Celexa -It is unclear whether the patient is still taking these   Hyponatremia -Secondary to hyperglycemia -resolved           Status is: Inpatient   Remains inpatient appropriate because:Inpatient level of care appropriate due to severity of illness   Dispo: The patient is from: Home              Anticipated d/c is to: Home              Patient currently is not medically stable to d/c.              Difficult to place patient No               Family Communication:  no Family at bedside   Consultants:  none   Code Status:  FULL   DVT Prophylaxis: [place] Lovenox     Procedures: As Listed in Progress Note Above   Antibiotics: Levoflox 9/9>>      Subjective: Patient denies fevers, chills, headache, dyspnea, nausea, vomiting, diarrhea, dysuria, hematuria, hematochezia, and melena.  She complains of her chronic abd and chest pain   Objective: Vitals:   08/14/21 1751 08/14/21 2056 08/14/21 2122 08/15/21 0800  BP:   (!) 124/94 (!) 150/109  Pulse:    (!) 110  Resp:    18  Temp: 97.8 F (36.6 C) 97.8 F (36.6 C)  97.6 F (36.4 C)  TempSrc: Oral Oral  Oral  SpO2:      Weight:      Height:        Intake/Output Summary (Last 24 hours) at 08/15/2021 1133 Last data filed at 08/14/2021 2200 Gross per 24 hour  Intake 559.68 ml  Output --  Net 559.68 ml   Weight change:  Exam:  General:  Pt is alert, follows commands appropriately, not in acute distress HEENT: No icterus, No thrush, No neck mass, [place]/AT Cardiovascular: RRR, S1/S2, no rubs, no gallops Respiratory: bibasilar rales. No wheeze Abdomen: Soft/+BS, non tender, non distended, no guarding Extremities: 1+ LE edema, No  lymphangitis, No petechiae, No rashes, no synovitis   Data Reviewed: I have personally reviewed following labs and imaging studies Basic Metabolic Panel: Recent Labs  Lab 08/12/21 0400 08/12/21 0858 08/13/21 0345 08/14/21 0448 08/15/21 0514  NA 133* 136 132* 136 133*  K 4.0 4.0 3.9 4.1 4.4  CL 104 104 99 101 99  CO2 26 27 26 28 27   GLUCOSE 127* 99 249* 106* 165*  BUN 20 18 12 10 11   CREATININE 1.08* 1.12* 1.10* 1.21* 1.42*  CALCIUM 7.9* 8.3* 8.0* 8.3* 7.5*  MG  --   --  1.7 2.1 2.1   Liver Function Tests: Recent Labs  Lab 08/11/21 1451 08/13/21 0345  AST 14* 20  ALT 16 11  ALKPHOS 109 76  BILITOT 1.0 0.5  PROT 7.1 5.5*  ALBUMIN 2.7* 1.8*   Recent Labs  Lab 08/11/21 1451  LIPASE 41   No results for input(s): AMMONIA in the last 168 hours. Coagulation Profile: No results for input(s): INR, PROTIME in the last 168 hours. CBC: Recent Labs  Lab 08/11/21  1451 08/13/21 0345 08/14/21 0448 08/15/21 0514  WBC 10.3 7.7 7.1 6.5  NEUTROABS 9.2*  --   --   --   HGB 14.7 10.4* 10.0* 9.6*  HCT 45.8 32.1* 31.0* 29.8*  MCV 93.5 91.5 91.2 90.6  PLT 289 356 361 377   Cardiac Enzymes: No results for input(s): CKTOTAL, CKMB, CKMBINDEX, TROPONINI in the last 168 hours. BNP: Invalid input(s): POCBNP CBG: Recent Labs  Lab 08/14/21 1632 08/14/21 2004 08/14/21 2039 08/15/21 0316 08/15/21 0746  GLUCAP 77 67* 120* 167* 189*   HbA1C: No results for input(s): HGBA1C in the last 72 hours. Urine analysis:    Component Value Date/Time   COLORURINE YELLOW 08/12/2021 2249   APPEARANCEUR CLEAR 08/12/2021 2249   APPEARANCEUR Clear 11/15/2020 1531   LABSPEC 1.010 08/12/2021 2249   PHURINE 6.5 08/12/2021 2249   GLUCOSEU 250 (A) 08/12/2021 2249   HGBUR SMALL (A) 08/12/2021 2249   BILIRUBINUR NEGATIVE 08/12/2021 2249   BILIRUBINUR NEG 12/05/2020 1353   BILIRUBINUR Negative 11/15/2020 1531   KETONESUR NEGATIVE 08/12/2021 2249   PROTEINUR 100 (A) 08/12/2021 2249    UROBILINOGEN 0.2 12/05/2020 1353   UROBILINOGEN 0.2 09/13/2012 2014   NITRITE NEGATIVE 08/12/2021 2249   LEUKOCYTESUR NEGATIVE 08/12/2021 2249   Sepsis Labs: @LABRCNTIP (procalcitonin:4,lacticidven:4) ) Recent Results (from the past 240 hour(s))  Blood culture (routine x 2)     Status: Abnormal   Collection Time: 08/11/21  5:36 PM   Specimen: Site Not Specified; Blood  Result Value Ref Range Status   Specimen Description   Final    SITE NOT SPECIFIED Performed at [facility], [address] [place], [place] [address]    Special Requests   Final    Blood Culture adequate volume BOTTLES DRAWN AEROBIC ONLY Performed at [facility], [address] [place], [place] [address]    Culture  Setup Time   Final    GRAM POSITIVE COCCI AEROBIC BOTTLE ONLY Gram Stain Report Called to,Read Back By and Verified With: COGER @ 3016 ON [id number] BY HENDERSON L CRITICAL RESULT CALLED TO, READ BACK BY AND VERIFIED WITH: L,HILTON RN @1826  08/12/21 EB    Culture (A)  Final    STAPHYLOCOCCUS HAEMOLYTICUS THE SIGNIFICANCE OF ISOLATING THIS ORGANISM FROM A SINGLE SET OF BLOOD CULTURES WHEN MULTIPLE SETS ARE DRAWN IS UNCERTAIN. PLEASE NOTIFY THE MICROBIOLOGY DEPARTMENT WITHIN ONE WEEK IF SPECIATION AND SENSITIVITIES ARE REQUIRED. Performed at [facility], [place] [address], [place], [place] [address]    Report Status 08/13/2021 FINAL  Final  Blood culture (routine x 2)     Status: None (Preliminary result)   Collection Time: 08/11/21  5:36 PM   Specimen: Site Not Specified; Blood  Result Value Ref Range Status   Specimen Description SITE NOT SPECIFIED  Final   Special Requests   Final    Blood Culture adequate volume BOTTLES DRAWN AEROBIC AND ANAEROBIC   Culture   Final    NO GROWTH 4 DAYS Performed at [facility], [address], [place], [place] [address]    Report Status PENDING  Incomplete  Blood Culture ID Panel (Reflexed)     Status: Abnormal   Collection Time: 08/11/21  5:36 PM  Result  Value Ref Range Status   Enterococcus faecalis NOT DETECTED NOT DETECTED Final   Enterococcus Faecium NOT DETECTED NOT DETECTED Final   Listeria monocytogenes NOT DETECTED NOT DETECTED Final   Staphylococcus species DETECTED (A) NOT DETECTED Final    Comment: CRITICAL RESULT CALLED TO, READ  BACK BY AND VERIFIED WITH: L,HILTON RN @1826  08/12/21 EB    Staphylococcus aureus (BCID) NOT DETECTED NOT DETECTED Final   Staphylococcus epidermidis NOT DETECTED NOT DETECTED Final   Staphylococcus lugdunensis NOT DETECTED NOT DETECTED Final   Streptococcus species NOT DETECTED NOT DETECTED Final   Streptococcus agalactiae NOT DETECTED NOT DETECTED Final   Streptococcus pneumoniae NOT DETECTED NOT DETECTED Final   Streptococcus pyogenes NOT DETECTED NOT DETECTED Final   A.calcoaceticus-baumannii NOT DETECTED NOT DETECTED Final   Bacteroides fragilis NOT DETECTED NOT DETECTED Final   Enterobacterales NOT DETECTED NOT DETECTED Final   Enterobacter cloacae complex NOT DETECTED NOT DETECTED Final   Escherichia coli NOT DETECTED NOT DETECTED Final   Klebsiella aerogenes NOT DETECTED NOT DETECTED Final   Klebsiella oxytoca NOT DETECTED NOT DETECTED Final   Klebsiella pneumoniae NOT DETECTED NOT DETECTED Final   Proteus species NOT DETECTED NOT DETECTED Final   Salmonella species NOT DETECTED NOT DETECTED Final   Serratia marcescens NOT DETECTED NOT DETECTED Final   Haemophilus influenzae NOT DETECTED NOT DETECTED Final   Neisseria meningitidis NOT DETECTED NOT DETECTED Final   Pseudomonas aeruginosa NOT DETECTED NOT DETECTED Final   Stenotrophomonas maltophilia NOT DETECTED NOT DETECTED Final   Candida albicans NOT DETECTED NOT DETECTED Final   Candida auris NOT DETECTED NOT DETECTED Final   Candida glabrata NOT DETECTED NOT DETECTED Final   Candida krusei NOT DETECTED NOT DETECTED Final   Candida parapsilosis NOT DETECTED NOT DETECTED Final   Candida tropicalis NOT DETECTED NOT DETECTED Final    Cryptococcus neoformans/gattii NOT DETECTED NOT DETECTED Final    Comment: Performed at [facility], [address], [place], [place] [address]  Resp Panel by RT-PCR (Flu A&B, Covid) Nasal Mucosa     Status: None   Collection Time: 08/11/21  6:30 PM   Specimen: Nasal Mucosa; Nasopharyngeal(NP) swabs in vial transport medium  Result Value Ref Range Status   SARS Coronavirus 2 by RT PCR NEGATIVE NEGATIVE Final    Comment: (NOTE) SARS-CoV-2 target nucleic acids are NOT DETECTED.  The SARS-CoV-2 RNA is generally detectable in upper respiratory specimens during the acute phase of infection. The lowest concentration of SARS-CoV-2 viral copies this assay can detect is 138 copies/mL. A negative result does not preclude SARS-Cov-2 infection and should not be used as the sole basis for treatment or other patient management decisions. A negative result may occur with  improper specimen collection/handling, submission of specimen other than nasopharyngeal swab, presence of viral mutation(s) within the areas targeted by this assay, and inadequate number of viral copies(<138 copies/mL). A negative result must be combined with clinical observations, patient history, and epidemiological information. The expected result is Negative.  Fact Sheet for Patients:  EntrepreneurPulse.com.au  Fact Sheet for Healthcare Providers:  IncredibleEmployment.be  This test is no t yet approved or cleared by the [place] FDA and  has been authorized for detection and/or diagnosis of SARS-CoV-2 by FDA under an Emergency Use Authorization (EUA). This EUA will remain  in effect (meaning this test can be used) for the duration of the COVID-19 declaration under Section 564(b)(1) of the Act, 21 U.S.C.section 360bbb-3(b)(1), unless the authorization is terminated  or revoked sooner.       Influenza A by PCR NEGATIVE NEGATIVE Final   Influenza B by PCR NEGATIVE NEGATIVE  Final    Comment: (NOTE) The Xpert Xpress SARS-CoV-2/FLU/RSV plus assay is intended as an aid in the diagnosis of influenza from Nasopharyngeal swab specimens and should  not be used as a sole basis for treatment. Nasal washings and aspirates are unacceptable for Xpert Xpress SARS-CoV-2/FLU/RSV testing.  Fact Sheet for Patients: EntrepreneurPulse.com.au  Fact Sheet for Healthcare Providers: IncredibleEmployment.be  This test is not yet approved or cleared by the [place] FDA and has been authorized for detection and/or diagnosis of SARS-CoV-2 by FDA under an Emergency Use Authorization (EUA). This EUA will remain in effect (meaning this test can be used) for the duration of the COVID-19 declaration under Section 564(b)(1) of the Act, 21 U.S.C. section 360bbb-3(b)(1), unless the authorization is terminated or revoked.  Performed at [facility], [address], [place], [place] [address]   MRSA Next Gen by PCR, Nasal     Status: None   Collection Time: 08/11/21  6:36 PM   Specimen: Nasal Mucosa; Nasal Swab  Result Value Ref Range Status   MRSA by PCR Next Gen NOT DETECTED NOT DETECTED Final    Comment: (NOTE) The GeneXpert MRSA Assay (FDA approved for NASAL specimens only), is one component of a comprehensive MRSA colonization surveillance program. It is not intended to diagnose MRSA infection nor to guide or monitor treatment for MRSA infections. Test performance is not FDA approved in patients less than 51 years old. Performed at [facility], [address] [place], [place] [address]   Urine Culture     Status: None   Collection Time: 08/12/21 10:49 PM   Specimen: Urine, Clean Catch  Result Value Ref Range Status   Specimen Description   Final    URINE, CLEAN CATCH Performed at [facility], [address] [place], [place] [address]    Special Requests   Final    NONE Performed at [facility], [address], [place], [place]  [address]    Culture   Final    NO GROWTH Performed at [facility], [place] [address], [place], [place] [address]    Report Status 08/14/2021 FINAL  Final     Scheduled Meds:  Chlorhexidine Gluconate Cloth  6 each Topical Daily   enoxaparin (LOVENOX) injection  40 mg Subcutaneous Q24H   hydrALAZINE  25 mg Oral Q8H   insulin aspart  0-5 Units Subcutaneous QHS   insulin aspart  0-6 Units Subcutaneous TID WC   insulin aspart  2 Units Subcutaneous TID WC   [START ON 08/16/2021] insulin glargine-yfgn  16 Units Subcutaneous Daily   isosorbide mononitrate  30 mg Oral Daily   lidocaine  1 patch Transdermal Q24H   metoCLOPramide (REGLAN) injection  5 mg Intravenous TID AC & HS   metoprolol tartrate  50 mg Oral BID   pantoprazole  40 mg Oral Daily   sodium chloride flush  10-40 mL Intracatheter Q12H   Continuous Infusions:  sodium chloride     levofloxacin (LEVAQUIN) IV 500 mg (08/15/21 0850)    Procedures/Studies: CT ABDOMEN PELVIS W CONTRAST  Result Date: 08/12/2021 CLINICAL DATA:  Acute nonlocalized abdominal pain in this patient with history of Crohn's disease with chronic epigastric and pelvic pain also with nausea and vomiting that began on 08/10/2021. EXAM: CT ABDOMEN AND PELVIS WITH CONTRAST TECHNIQUE: Multidetector CT imaging of the abdomen and pelvis was performed using the standard protocol following bolus administration of intravenous contrast. CONTRAST:  33m OMNIPAQUE IOHEXOL 350 MG/ML SOLN COMPARISON:  Comparison made with July 18, 2021. FINDINGS: Lower chest: Small bilateral pleural effusions have developed since the prior study along with patchy areas of ground-glass opacity in the chest. Hepatobiliary: Liver with smooth  contours. Patent portal vein. No gallbladder wall thickening or gross biliary duct distension. Area of hyperenhancement along the margin of the RIGHT hemi liver away from the gallbladder fossa measures approximately 1.8 cm greatest axial and coronal dimension  (image 49/2) Pancreas: Normal, without mass, inflammation or ductal dilatation. Spleen: Normal. Adrenals/Urinary Tract: Adrenal glands are normal. Area of hypodensity in the lower pole the RIGHT kidney with loss of normal corticomedullary distinction on image 46 of series 2 involving the lower anterior RIGHT kidney. No change in contour associated with this finding at this time. Renal vein is patent. Normal appearance of the LEFT kidney. No hydronephrosis. No perinephric stranding. Marked stranding about the urinary bladder which is moderate lead to markedly distended which extends up into the lower abdomen. Stomach/Bowel: Gastric and esophageal thickening, diffuse thickening is suggested on the current study. No signs of small bowel obstruction. The appendix is normal. Colon is decompressed. Vascular/Lymphatic: Normal caliber of the abdominal aorta. Patent abdominal vessels as well as can be assessed on early venous phase. There is no gastrohepatic or hepatoduodenal ligament lymphadenopathy. No retroperitoneal or mesenteric lymphadenopathy. Mildly prominent pelvic lymph nodes are unchanged including bilateral inguinal lymph nodes since previous imaging. These are likely reactive. Reproductive: Unremarkable by CT. Other: Small volume fluid in the abdomen in the pelvis. Diffuse body wall edema. Diffuse mesenteric edema.  Small fat containing umbilical hernia. Musculoskeletal: No acute musculoskeletal process. No destructive bone finding. IMPRESSION: Marked anasarca which has developed since the previous study. Patchy bilateral ground-glass and subpleural banding. Findings are suggestive of atypical pulmonary infection. Correlate with respiratory symptoms and any history of COVID-19 infection in particular. Signs of suspected focal nephritis involving the lower pole the RIGHT kidney. This is seen in the context of markedly distended and somewhat thickened urinary bladder. Would correlate with signs of cystitis and  urinalysis as well with signs of bladder outlet obstruction. Findings in the RIGHT kidney are worse than on previous imaging and persistent since prior study of August. For this reason would suggest follow-up to exclude underlying mass in this location. Focal area of hyperenhancement in the RIGHT hemi liver perhaps related to vascular phenomenon given focal appearance lesion not excluded, underlying would consider abdominal sonogram for follow-up of both renal and liver findings. This would be best performed outside of the acute setting. Findings that suggest esophagitis and gastritis. Normal appendix. Electronically Signed   By: Zetta Bills M.D.   On: 08/12/2021 16:56   DG Chest Port 1 View  Result Date: 08/11/2021 CLINICAL DATA:  Chest pain, generalized edema throughout phase abdomen limbs. 25 year old female. EXAM: PORTABLE CHEST 1 VIEW COMPARISON:  August 06, 2021. FINDINGS: Trachea midline. Cardiomediastinal contours and hilar structures grossly stable. EKG leads project over the chest. Patchy basilar predominant opacities in the chest have developed since the previous study worse on the RIGHT than the LEFT. No sign of lobar consolidative process. No effusion on frontal radiograph. No sign of pneumothorax. On limited assessment no acute skeletal process. IMPRESSION: Patchy basilar predominant opacities in the chest have developed since the previous study. Suspicious for multifocal pneumonia. Electronically Signed   By: Zetta Bills M.D.   On: 08/11/2021 15:02   ECHOCARDIOGRAM COMPLETE  Result Date: 08/12/2021    ECHOCARDIOGRAM REPORT   Patient Name:   AMIYAH SHRYOCK Date of Exam: 08/12/2021 Medical Rec #:  [id number]        Height:       59.0 in Accession #:    [id number]  Weight:       142.9 lb Date of Birth:  1996-06-19        BSA:          1.599 m Patient Age:    25 years         BP:           112/74 mmHg Patient Gender: F                HR:           99 bpm. Exam Location:  [facility]  Procedure: 2D Echo, Cardiac Doppler and Color Doppler Indications:    R07.9* Chest pain, unspecified  History:        Patient has prior history of Echocardiogram examinations, most                 recent 03/09/2021. Stroke; Risk Factors:Hypertension and Diabetes.  Sonographer:    Bernadene Person RDCS Referring Phys: [phone] Verdell Dykman IMPRESSIONS  1. Left ventricular ejection fraction, by estimation, is 60 to 65%. The left ventricle has normal function. The left ventricle has no regional wall motion abnormalities. There is mild left ventricular hypertrophy. Left ventricular diastolic parameters were normal.  2. Right ventricular systolic function is normal. The right ventricular size is normal. There is normal pulmonary artery systolic pressure.  3. The mitral valve is normal in structure. No evidence of mitral valve regurgitation. No evidence of mitral stenosis.  4. The aortic valve is tricuspid. Aortic valve regurgitation is not visualized. No aortic stenosis is present.  5. The inferior vena cava is normal in size with greater than 50% respiratory variability, suggesting right atrial pressure of 3 mmHg. FINDINGS  Left Ventricle: Left ventricular ejection fraction, by estimation, is 60 to 65%. The left ventricle has normal function. The left ventricle has no regional wall motion abnormalities. The left ventricular internal cavity size was normal in size. There is  mild left ventricular hypertrophy. Left ventricular diastolic parameters were normal. Right Ventricle: The right ventricular size is normal. Right ventricular systolic function is normal. There is normal pulmonary artery systolic pressure. The tricuspid regurgitant velocity is 2.03 m/s, and with an assumed right atrial pressure of 3 mmHg,  the estimated right ventricular systolic pressure is 08.1 mmHg. Left Atrium: Left atrial size was normal in size. Right Atrium: Right atrial size was normal in size. Pericardium: Trivial pericardial effusion is present.  Mitral Valve: The mitral valve is normal in structure. No evidence of mitral valve regurgitation. No evidence of mitral valve stenosis. Tricuspid Valve: The tricuspid valve is normal in structure. Tricuspid valve regurgitation is trivial. No evidence of tricuspid stenosis. Aortic Valve: The aortic valve is tricuspid. Aortic valve regurgitation is not visualized. No aortic stenosis is present. Aortic valve mean gradient measures 3.0 mmHg. Aortic valve peak gradient measures 3.5 mmHg. Pulmonic Valve: The pulmonic valve was normal in structure. Pulmonic valve regurgitation is not visualized. No evidence of pulmonic stenosis. Aorta: The aortic root is normal in size and structure. Venous: The inferior vena cava is normal in size with greater than 50% respiratory variability, suggesting right atrial pressure of 3 mmHg. IAS/Shunts: No atrial level shunt detected by color flow Doppler.  LEFT VENTRICLE PLAX 2D LVIDd:         3.43 cm  Diastology LVIDs:         2.15 cm  LV e' lateral:   11.70 cm/s LV PW:         1.12 cm  LV E/e'  lateral: 5.8 LV IVS:        0.98 cm LVOT diam:     1.60 cm LVOT Area:     2.01 cm  RIGHT VENTRICLE RV S prime:     12.70 cm/s TAPSE (M-mode): 1.8 cm LEFT ATRIUM             Index       RIGHT ATRIUM           Index LA diam:        2.60 cm 1.63 cm/m  RA Area:     12.30 cm LA Vol (A2C):   20.3 ml 12.70 ml/m RA Volume:   26.40 ml  16.51 ml/m LA Vol (A4C):   18.6 ml 11.64 ml/m LA Biplane Vol: 20.9 ml 13.07 ml/m  AORTIC VALVE AV Vmax:      94.00 cm/s AV Vmean:     79.500 cm/s AV VTI:       0.162 m AV Peak Grad: 3.5 mmHg AV Mean Grad: 3.0 mmHg  AORTA Ao Root diam: 2.40 cm Ao Asc diam:  2.50 cm MITRAL VALVE               TRICUSPID VALVE MV Area (PHT): 6.12 cm    TR Peak grad:   16.5 mmHg MV Decel Time: 124 msec    TR Vmax:        203.00 cm/s MV E velocity: 67.90 cm/s MV A velocity: 66.20 cm/s  SHUNTS MV E/A ratio:  1.03        Systemic Diam: 1.60 cm Kirk Ruths MD Electronically signed by Kirk Ruths MD Signature Date/Time: 08/12/2021/12:32:31 PM    Final     Orson Eva, DO  [facility]  If 7PM-7AM, please contact night-coverage [email or web address] Password [facility] 08/15/2021, 11:33 AM   LOS: 4 days

## 2021-08-15 NOTE — Telephone Encounter (Signed)
Pt has not returned since initial evaluation on 8/24 and has not shown for 3 consecutive scheduled appointments.  Pt will be discharged per [facility] policy.  Unable to reach at this time as she is currently admitted into hospital for non related issue.  Teena Irani, PTA/CLT [phone]

## 2021-08-16 ENCOUNTER — Telehealth: Payer: Self-pay

## 2021-08-16 DIAGNOSIS — J189 Pneumonia, unspecified organism: Secondary | ICD-10-CM

## 2021-08-16 DIAGNOSIS — R0789 Other chest pain: Secondary | ICD-10-CM

## 2021-08-16 DIAGNOSIS — R778 Other specified abnormalities of plasma proteins: Secondary | ICD-10-CM

## 2021-08-16 LAB — CULTURE, BLOOD (ROUTINE X 2)
Culture: NO GROWTH
Special Requests: ADEQUATE

## 2021-08-16 LAB — GLUCOSE, CAPILLARY
Glucose-Capillary: 183 mg/dL — ABNORMAL HIGH (ref 70–99)
Glucose-Capillary: 309 mg/dL — ABNORMAL HIGH (ref 70–99)

## 2021-08-16 LAB — BASIC METABOLIC PANEL
Anion gap: 6 (ref 5–15)
BUN: 14 mg/dL (ref 6–20)
CO2: 26 mmol/L (ref 22–32)
Calcium: 7.7 mg/dL — ABNORMAL LOW (ref 8.9–10.3)
Chloride: 105 mmol/L (ref 98–111)
Creatinine, Ser: 1.44 mg/dL — ABNORMAL HIGH (ref 0.44–1.00)
GFR, Estimated: 52 mL/min — ABNORMAL LOW (ref >60)
Glucose, Bld: 164 mg/dL — ABNORMAL HIGH (ref 70–99)
Potassium: 4.3 mmol/L (ref 3.5–5.1)
Sodium: 137 mmol/L (ref 135–145)

## 2021-08-16 LAB — TROPONIN I (HIGH SENSITIVITY): Troponin I (High Sensitivity): 18 ng/L — ABNORMAL HIGH (ref <18)

## 2021-08-16 MED ORDER — LEVOFLOXACIN 750 MG PO TABS
750.0000 mg | ORAL_TABLET | ORAL | Status: DC
  Administered 2021-08-16: 750 mg via ORAL
  Filled 2021-08-16: qty 1

## 2021-08-16 MED ORDER — INSULIN GLARGINE 100 UNIT/ML ~~LOC~~ SOLN: 30.0000 [IU] | Freq: Every day | SUBCUTANEOUS | Status: DC

## 2021-08-16 MED ORDER — OMEPRAZOLE 40 MG PO CPDR
40.0000 mg | DELAYED_RELEASE_CAPSULE | Freq: Two times a day (BID) | ORAL | 1 refills | Status: DC
Start: 2021-08-16 — End: 2021-10-19

## 2021-08-16 MED ORDER — LEVOFLOXACIN 750 MG PO TABS: 750.0000 mg | ORAL_TABLET | ORAL | 0 refills | Status: AC

## 2021-08-16 NOTE — Discharge Summary (Signed)
Physician Discharge Summary  Diamond [name]:[id number] DOB: Jan 04, 1996 DOA: 08/11/2021  PCP: [name], PA-C  Admit date: 08/11/2021 Discharge date: 08/16/2021  Time spent: 35 minutes  Recommendations for Outpatient Follow-up:  Repeat basic metabolic panel to follow twice renal function Reassess blood pressure and adjust antihypertensive as needed Close monitoring of patient's CBGs/A1c with further adjustment to hypoglycemia regimen as required Repeat chest x-ray in 4-6 weeks to assure complete resolution of lobar infiltrates.   Discharge Diagnoses:  Active Problems:   Mood disorder ([place])   Essential hypertension, benign   Diabetic gastroparesis ([place])   Hyperosmolar hyperglycemic state (HHS) ([place])   Community acquired pneumonia   DM1 with Diabetic nephropathy with proteinuria and Nephrotic Syndrome-   AKI (acute kidney injury) ([place])   Diabetic ketoacidosis without coma associated with type 1 diabetes mellitus ([place])   Elevated troponin   Atypical chest pain   Discharge Condition: Stable and improved.  Discharged home with instruction to follow-up with PCP and cardiology service and outpatient.  CODE STATUS: Full code  Diet recommendation: Heart healthy and modify carbohydrate diet  Filed Weights   08/11/21 1308 08/11/21 1837  Weight: 67.2 kg 64.8 kg    History of present illness:  Diamond [name] is a 25 y.o. female with medical history of  DM type 1, bipolar disorder, anasarca, Crohn's Disease, and hypertension, MSSA bactermia, nephrotic syndrome presenting nausea and vomiting that began on the evening of 08/10/2021.  The patient checked her blood sugar at that time, and she stated that it was in the 60s.  She drank some orange juice and subsequently developed abdominal pain with nausea and vomiting.  She felt like she would just sleep it off.  However when she woke up the morning she continued to have some nausea and abdominal pain.  She continued to have nausea and  vomiting.  She developed chest pain or shortness of breath on the morning of 08/11/2021.  She also developed blurry vision bilateral eyes.  As result, the patient presented for further evaluation.  She endorses compliance with all her medications including her insulin however review of the medical record shows that the patient has a long history of poor compliance. She denies any fevers, chills, headache, neck pain, diarrhea, hematochezia, melena. In the emergency department, the patient had low-grade temperature of 99.7 F.  She was tachycardic up to 115.  She was hemodynamically stable.  Oxygen saturation was 86% on room air.  She was placed on 4 L with saturation up to 96%.  BMP shows sodium 130, potassium 4.8, chloride 96, CO2 94, anion gap 10.  Serum glucose was 1008.  Serum creatinine 1.37.  LFTs were unremarkable.  Lipase 41.  WBC 10.3, hemoglobin 14.7 platelets 289,000.  Initial troponin was 72.  EKG shows sinus tachycardia with nonspecific T wave changes which were unchanged from previous EKGs.  BNP was 192.  Chest x-ray showed bibasilar opacities, right greater than left.  The patient was started on levofloxacin.  Hospital Course:  1-nonketotic hyperosmolar state -In the setting of uncontrolled type 1 diabetes mellitus with hyperglycemia. -Appropriately treated with insulin drip and fluid resuscitation -CBGs stabilized at time of discharge. -Recent A1c (08/11/2021) 13.5 -Resume adjusted dose of sliding scale insulin and long-acting. -Advised to maintain adequate hydration and to follow modified carbohydrate diet. -Continue outpatient close follow-up for further adjustment to hypoglycemic regimen as required. -Electrolytes repleted and within normal limits at discharge.  2-acute respiratory failure with hypoxia secondary to lobar pneumonia -At time of  admission patient requiring 4 L nasal cannula supplementation to maintain O2 sat above 90%; at time of discharge good saturation on room air  appreciated. -Patient will complete treatment with oral Levaquin and will recommend repeat chest x-ray in 4-6 weeks to assure complete resolution of infiltrates. -No specific microorganism or culture isolated; presumed to be bacterial in nature.  3-nausea and vomiting -In the setting of underlying gastroparesis -Treated with IV fluids and Reglan -Advised to maintain adequate hydration and to be compliant with insulin therapy -Continue PPI.  4-gastroesophageal flux disease -Continue PPI.  5-acute kidney injury -Baseline creatinine 0.9-1.0 -Serum creatinine up to 1.44; trending down at time of discharge -Patient advised to maintain adequate hydration -Continue minimizing the use of nephrotoxic agents; HCTZ has been discontinued. -Repeat basic metabolic panel follow-up visit to assess electrolytes trend and stability.  6-essential hypertension -Continue the use of metoprolol, hydralazine and Imdur -Reassess blood pressure at follow-up visit to adjust antihypertensive as needed  7-atypical chest pain/Elevated troponin -Noncardiac -Continue beta-blocker and Imdur -Troponin trended down appropriately -2D echo reassuring and after discussing with cardiology service no ischemic work-up as an inpatient decided. -EKG and telemetry without acute ischemic changes. -Patient will follow-up with cardiology as an outpatient for further estratification and evaluation.  8-history of bipolar disorder -Patient reports no using any psychotropic medications currently -No suicidal ideation or hallucination -Continue outpatient follow-up.  9-secondary hyponatremia -Secondary to hyperglycemia -Resolved and with electrolytes within normal limits at discharge.   Procedures: 2D echo: No wall motion normalities; preserved ejection fraction.  No significant valvular disorder.  Consultations: Cardiology service  Discharge Exam: Vitals:   08/16/21 0108 08/16/21 0536  BP: 118/63 124/83  Pulse: (!)  104 98  Resp: 20 19  Temp:  98.4 F (36.9 C)  SpO2: 92% 96%    General: Afebrile, no chest pain, reports no nausea or vomiting.  Expressed feeling weak and tired. Cardiovascular: No rubs, no gallops, no JVD. Respiratory: Clear to auscultation bilaterally; no using accessory muscle.  Good saturation on room air Abdomen: Soft, nontender, positive bowel sounds Extremities: No cyanosis or clubbing.  Discharge Instructions   Discharge Instructions     Diet - low sodium heart healthy   Complete by: As directed    Discharge instructions   Complete by: As directed    Take medications as prescribed and complete antibiotic therapy as instructed. Maintain adequate hydration Arrange follow-up with PCP in 10 days Check your blood sugar at least 3 times a day and be compliant with your insulin therapy.   Increase activity slowly   Complete by: As directed       Allergies as of 08/16/2021       Reactions   Amoxicillin Hives   Did it involve swelling of the face/tongue/throat, SOB, or low BP? no  Did it involve sudden or severe rash/hives, skin peeling, or any reaction on the inside of your mouth or nose? no Did you need to seek medical attention at a hospital or doctor's office? no When did it last happen?   unk    If all above answers are "NO", may proceed with cephalosporin use.   Dexamethasone Hives, Itching   Doxycycline Itching, Swelling   Penicillins Hives, Itching   Did it involve swelling of the face/tongue/throat, SOB, or low BP? no  Did it involve sudden or severe rash/hives, skin peeling, or any reaction on the inside of your mouth or nose? no Did you need to seek medical attention at a hospital or doctor's office?  no When did it last happen?   unk    If all above answers are "NO", may proceed with cephalosporin use. Did it involve swelling of the face/tongue/throat, SOB, or low BP? no  Did it involve sudden or severe rash/hives, skin peeling, or any reac   Ceftriaxone  Sodium In Dextrose Itching   Augmentin [amoxicillin-pot Clavulanate] Other (See Comments)   Reaction unknown   Clavulanic Acid    Adhesive [tape] Itching, Rash   Latex Itching, Rash   Other Itching, Rash   Pineapple Rash   Rash on tongue and throat        Medication List     STOP taking these medications    hydrochlorothiazide 25 MG tablet Commonly known as: HYDRODIURIL   loperamide 2 MG tablet Commonly known as: Imodium A-D   promethazine 25 MG tablet Commonly known as: PHENERGAN       TAKE these medications    acetaminophen 500 MG tablet Commonly known as: TYLENOL Take by mouth.   albuterol 108 (90 Base) MCG/ACT inhaler Commonly known as: VENTOLIN HFA Inhale 2 puffs into the lungs every 6 (six) hours as needed for wheezing or shortness of breath. For shortness of breath   APIDRA [place] Inject 10-16 Units into the skin 3 (three) times daily before meals. ssi   furosemide 20 MG tablet Commonly known as: LASIX Take 60 mg by mouth daily.   gabapentin 100 MG capsule Commonly known as: NEURONTIN Take 1 capsule (100 mg total) by mouth 2 (two) times daily.   hydrALAZINE 25 MG tablet Commonly known as: APRESOLINE Take 1 tablet (25 mg total) by mouth 2 (two) times daily.   insulin glargine 100 UNIT/ML injection Commonly known as: LANTUS Inject 0.3 mLs (30 Units total) into the skin at bedtime. What changed: how much to take   isosorbide mononitrate 30 MG 24 hr tablet Commonly known as: IMDUR Take 0.5 tablets (15 mg total) by mouth daily. What changed: how much to take   levofloxacin 750 MG tablet Commonly known as: LEVAQUIN Take 1 tablet (750 mg total) by mouth every other day for 2 doses. Start taking on: August 18, 2021   metoCLOPramide 5 MG tablet Commonly known as: Reglan Take 1 tablet (5 mg total) by mouth every 8 (eight) hours as needed for refractory nausea / vomiting (refractory symptoms).   metoprolol tartrate 50 MG tablet Commonly known as:  LOPRESSOR Take 1 tablet (50 mg total) by mouth 2 (two) times daily.   omeprazole 40 MG capsule Commonly known as: PRILOSEC Take 1 capsule (40 mg total) by mouth in the morning and at bedtime.   ondansetron 8 MG disintegrating tablet Commonly known as: Zofran ODT Take 1 tablet (8 mg total) by mouth every 8 (eight) hours as needed for nausea or vomiting.   potassium chloride 10 MEQ tablet Commonly known as: KLOR-CON Take 10 mEq by mouth daily.       Allergies  Allergen Reactions   Amoxicillin Hives    Did it involve swelling of the face/tongue/throat, SOB, or low BP? no  Did it involve sudden or severe rash/hives, skin peeling, or any reaction on the inside of your mouth or nose? no Did you need to seek medical attention at a hospital or doctor's office? no When did it last happen?   unk    If all above answers are "NO", may proceed with cephalosporin use.   Dexamethasone Hives and Itching   Doxycycline Itching and Swelling   Penicillins Hives and  Itching    Did it involve swelling of the face/tongue/throat, SOB, or low BP? no  Did it involve sudden or severe rash/hives, skin peeling, or any reaction on the inside of your mouth or nose? no Did you need to seek medical attention at a hospital or doctor's office? no When did it last happen?   unk    If all above answers are "NO", may proceed with cephalosporin use. Did it involve swelling of the face/tongue/throat, SOB, or low BP? no  Did it involve sudden or severe rash/hives, skin peeling, or any reac   Ceftriaxone Sodium In Dextrose Itching   Augmentin [Amoxicillin-Pot Clavulanate] Other (See Comments)    Reaction unknown   Clavulanic Acid    Adhesive [Tape] Itching and Rash   Latex Itching and Rash   Other Itching and Rash   Pineapple Rash    Rash on tongue and throat    Follow-up Information     [name], PA-C. Schedule an appointment as soon as possible for a visit in 10 day(s).   Specialty: Physician  Assistant Contact information: [address] [place] [address] [phone]         Satira Sark, MD .   Specialty: Cardiology Contact information: [place] [address] [phone]                 The results of significant diagnostics from this hospitalization (including imaging, microbiology, ancillary and laboratory) are listed below for reference.    Significant Diagnostic Studies: CT ABDOMEN PELVIS W CONTRAST  Result Date: 08/12/2021 CLINICAL DATA:  Acute nonlocalized abdominal pain in this patient with history of Crohn's disease with chronic epigastric and pelvic pain also with nausea and vomiting that began on 08/10/2021. EXAM: CT ABDOMEN AND PELVIS WITH CONTRAST TECHNIQUE: Multidetector CT imaging of the abdomen and pelvis was performed using the standard protocol following bolus administration of intravenous contrast. CONTRAST:  64m OMNIPAQUE IOHEXOL 350 MG/ML SOLN COMPARISON:  Comparison made with July 18, 2021. FINDINGS: Lower chest: Small bilateral pleural effusions have developed since the prior study along with patchy areas of ground-glass opacity in the chest. Hepatobiliary: Liver with smooth contours. Patent portal vein. No gallbladder wall thickening or gross biliary duct distension. Area of hyperenhancement along the margin of the RIGHT hemi liver away from the gallbladder fossa measures approximately 1.8 cm greatest axial and coronal dimension (image 49/2) Pancreas: Normal, without mass, inflammation or ductal dilatation. Spleen: Normal. Adrenals/Urinary Tract: Adrenal glands are normal. Area of hypodensity in the lower pole the RIGHT kidney with loss of normal corticomedullary distinction on image 46 of series 2 involving the lower anterior RIGHT kidney. No change in contour associated with this finding at this time. Renal vein is patent. Normal appearance of the LEFT kidney. No hydronephrosis. No perinephric stranding. Marked  stranding about the urinary bladder which is moderate lead to markedly distended which extends up into the lower abdomen. Stomach/Bowel: Gastric and esophageal thickening, diffuse thickening is suggested on the current study. No signs of small bowel obstruction. The appendix is normal. Colon is decompressed. Vascular/Lymphatic: Normal caliber of the abdominal aorta. Patent abdominal vessels as well as can be assessed on early venous phase. There is no gastrohepatic or hepatoduodenal ligament lymphadenopathy. No retroperitoneal or mesenteric lymphadenopathy. Mildly prominent pelvic lymph nodes are unchanged including bilateral inguinal lymph nodes since previous imaging. These are likely reactive. Reproductive: Unremarkable by CT. Other: Small volume fluid in the abdomen in the pelvis. Diffuse  body wall edema. Diffuse mesenteric edema.  Small fat containing umbilical hernia. Musculoskeletal: No acute musculoskeletal process. No destructive bone finding. IMPRESSION: Marked anasarca which has developed since the previous study. Patchy bilateral ground-glass and subpleural banding. Findings are suggestive of atypical pulmonary infection. Correlate with respiratory symptoms and any history of COVID-19 infection in particular. Signs of suspected focal nephritis involving the lower pole the RIGHT kidney. This is seen in the context of markedly distended and somewhat thickened urinary bladder. Would correlate with signs of cystitis and urinalysis as well with signs of bladder outlet obstruction. Findings in the RIGHT kidney are worse than on previous imaging and persistent since prior study of August. For this reason would suggest follow-up to exclude underlying mass in this location. Focal area of hyperenhancement in the RIGHT hemi liver perhaps related to vascular phenomenon given focal appearance lesion not excluded, underlying would consider abdominal sonogram for follow-up of both renal and liver findings. This would  be best performed outside of the acute setting. Findings that suggest esophagitis and gastritis. Normal appendix. Electronically Signed   By: Zetta Bills M.D.   On: 08/12/2021 16:56   DG Chest Port 1 View  Result Date: 08/11/2021 CLINICAL DATA:  Chest pain, generalized edema throughout phase abdomen limbs. 25 year old female. EXAM: PORTABLE CHEST 1 VIEW COMPARISON:  August 06, 2021. FINDINGS: Trachea midline. Cardiomediastinal contours and hilar structures grossly stable. EKG leads project over the chest. Patchy basilar predominant opacities in the chest have developed since the previous study worse on the RIGHT than the LEFT. No sign of lobar consolidative process. No effusion on frontal radiograph. No sign of pneumothorax. On limited assessment no acute skeletal process. IMPRESSION: Patchy basilar predominant opacities in the chest have developed since the previous study. Suspicious for multifocal pneumonia. Electronically Signed   By: Zetta Bills M.D.   On: 08/11/2021 15:02   ECHOCARDIOGRAM COMPLETE  Result Date: 08/12/2021    ECHOCARDIOGRAM REPORT   Patient Name:   Diamond [name] Date of Exam: 08/12/2021 Medical Rec #:  [id number]        Height:       59.0 in Accession #:    [id number]       Weight:       142.9 lb Date of Birth:  Apr 28, 1996        BSA:          1.599 m Patient Age:    25 years         BP:           112/74 mmHg Patient Gender: F                HR:           99 bpm. Exam Location:  [facility] Procedure: 2D Echo, Cardiac Doppler and Color Doppler Indications:    R07.9* Chest pain, unspecified  History:        Patient has prior history of Echocardiogram examinations, most                 recent 03/09/2021. Stroke; Risk Factors:Hypertension and Diabetes.  Sonographer:    Bernadene Person RDCS Referring Phys: [phone] DAVID TAT IMPRESSIONS  1. Left ventricular ejection fraction, by estimation, is 60 to 65%. The left ventricle has normal function. The left ventricle has no regional wall motion  abnormalities. There is mild left ventricular hypertrophy. Left ventricular diastolic parameters were normal.  2. Right ventricular systolic function is normal. The right ventricular size is normal. There  is normal pulmonary artery systolic pressure.  3. The mitral valve is normal in structure. No evidence of mitral valve regurgitation. No evidence of mitral stenosis.  4. The aortic valve is tricuspid. Aortic valve regurgitation is not visualized. No aortic stenosis is present.  5. The inferior vena cava is normal in size with greater than 50% respiratory variability, suggesting right atrial pressure of 3 mmHg. FINDINGS  Left Ventricle: Left ventricular ejection fraction, by estimation, is 60 to 65%. The left ventricle has normal function. The left ventricle has no regional wall motion abnormalities. The left ventricular internal cavity size was normal in size. There is  mild left ventricular hypertrophy. Left ventricular diastolic parameters were normal. Right Ventricle: The right ventricular size is normal. Right ventricular systolic function is normal. There is normal pulmonary artery systolic pressure. The tricuspid regurgitant velocity is 2.03 m/s, and with an assumed right atrial pressure of 3 mmHg,  the estimated right ventricular systolic pressure is 98.9 mmHg. Left Atrium: Left atrial size was normal in size. Right Atrium: Right atrial size was normal in size. Pericardium: Trivial pericardial effusion is present. Mitral Valve: The mitral valve is normal in structure. No evidence of mitral valve regurgitation. No evidence of mitral valve stenosis. Tricuspid Valve: The tricuspid valve is normal in structure. Tricuspid valve regurgitation is trivial. No evidence of tricuspid stenosis. Aortic Valve: The aortic valve is tricuspid. Aortic valve regurgitation is not visualized. No aortic stenosis is present. Aortic valve mean gradient measures 3.0 mmHg. Aortic valve peak gradient measures 3.5 mmHg. Pulmonic Valve:  The pulmonic valve was normal in structure. Pulmonic valve regurgitation is not visualized. No evidence of pulmonic stenosis. Aorta: The aortic root is normal in size and structure. Venous: The inferior vena cava is normal in size with greater than 50% respiratory variability, suggesting right atrial pressure of 3 mmHg. IAS/Shunts: No atrial level shunt detected by color flow Doppler.  LEFT VENTRICLE PLAX 2D LVIDd:         3.43 cm  Diastology LVIDs:         2.15 cm  LV e' lateral:   11.70 cm/s LV PW:         1.12 cm  LV E/e' lateral: 5.8 LV IVS:        0.98 cm LVOT diam:     1.60 cm LVOT Area:     2.01 cm  RIGHT VENTRICLE RV S prime:     12.70 cm/s TAPSE (M-mode): 1.8 cm LEFT ATRIUM             Index       RIGHT ATRIUM           Index LA diam:        2.60 cm 1.63 cm/m  RA Area:     12.30 cm LA Vol (A2C):   20.3 ml 12.70 ml/m RA Volume:   26.40 ml  16.51 ml/m LA Vol (A4C):   18.6 ml 11.64 ml/m LA Biplane Vol: 20.9 ml 13.07 ml/m  AORTIC VALVE AV Vmax:      94.00 cm/s AV Vmean:     79.500 cm/s AV VTI:       0.162 m AV Peak Grad: 3.5 mmHg AV Mean Grad: 3.0 mmHg  AORTA Ao Root diam: 2.40 cm Ao Asc diam:  2.50 cm MITRAL VALVE               TRICUSPID VALVE MV Area (PHT): 6.12 cm    TR Peak grad:   16.5 mmHg MV  Decel Time: 124 msec    TR Vmax:        203.00 cm/s MV E velocity: 67.90 cm/s MV A velocity: 66.20 cm/s  SHUNTS MV E/A ratio:  1.03        Systemic Diam: 1.60 cm Kirk Ruths MD Electronically signed by Kirk Ruths MD Signature Date/Time: 08/12/2021/12:32:31 PM    Final     Microbiology: Recent Results (from the past 240 hour(s))  Blood culture (routine x 2)     Status: Abnormal   Collection Time: 08/11/21  5:36 PM   Specimen: Site Not Specified; Blood  Result Value Ref Range Status   Specimen Description   Final    SITE NOT SPECIFIED Performed at [facility], [address], [place], [place] [address]    Special Requests   Final    Blood Culture adequate volume BOTTLES DRAWN AEROBIC  ONLY Performed at [facility], [address] [place], [place] [address]    Culture  Setup Time   Final    GRAM POSITIVE COCCI AEROBIC BOTTLE ONLY Gram Stain Report Called to,Read Back By and Verified With: COGER @ [id number] ON [id number] BY HENDERSON L CRITICAL RESULT CALLED TO, READ BACK BY AND VERIFIED WITH: L,HILTON RN @1826  08/12/21 EB    Culture (A)  Final    STAPHYLOCOCCUS HAEMOLYTICUS THE SIGNIFICANCE OF ISOLATING THIS ORGANISM FROM A SINGLE SET OF BLOOD CULTURES WHEN MULTIPLE SETS ARE DRAWN IS UNCERTAIN. PLEASE NOTIFY THE MICROBIOLOGY DEPARTMENT WITHIN ONE WEEK IF SPECIATION AND SENSITIVITIES ARE REQUIRED. Performed at [facility], [place] [address] [place], [place] [address]    Report Status 08/13/2021 FINAL  Final  Blood culture (routine x 2)     Status: None   Collection Time: 08/11/21  5:36 PM   Specimen: Site Not Specified; Blood  Result Value Ref Range Status   Specimen Description SITE NOT SPECIFIED  Final   Special Requests   Final    Blood Culture adequate volume BOTTLES DRAWN AEROBIC AND ANAEROBIC   Culture   Final    NO GROWTH 5 DAYS Performed at [facility], [address], [place], [place] [address]    Report Status 08/16/2021 FINAL  Final  Blood Culture ID Panel (Reflexed)     Status: Abnormal   Collection Time: 08/11/21  5:36 PM  Result Value Ref Range Status   Enterococcus faecalis NOT DETECTED NOT DETECTED Final   Enterococcus Faecium NOT DETECTED NOT DETECTED Final   Listeria monocytogenes NOT DETECTED NOT DETECTED Final   Staphylococcus species DETECTED (A) NOT DETECTED Final    Comment: CRITICAL RESULT CALLED TO, READ BACK BY AND VERIFIED WITH: L,HILTON RN @1826  08/12/21 EB    Staphylococcus aureus (BCID) NOT DETECTED NOT DETECTED Final   Staphylococcus epidermidis NOT DETECTED NOT DETECTED Final   Staphylococcus lugdunensis NOT DETECTED NOT DETECTED Final   Streptococcus species NOT DETECTED NOT DETECTED Final   Streptococcus agalactiae NOT DETECTED  NOT DETECTED Final   Streptococcus pneumoniae NOT DETECTED NOT DETECTED Final   Streptococcus pyogenes NOT DETECTED NOT DETECTED Final   A.calcoaceticus-baumannii NOT DETECTED NOT DETECTED Final   Bacteroides fragilis NOT DETECTED NOT DETECTED Final   Enterobacterales NOT DETECTED NOT DETECTED Final   Enterobacter cloacae complex NOT DETECTED NOT DETECTED Final   Escherichia coli NOT DETECTED NOT DETECTED Final   Klebsiella aerogenes NOT DETECTED NOT DETECTED Final   Klebsiella oxytoca NOT DETECTED NOT DETECTED Final   Klebsiella pneumoniae NOT DETECTED NOT DETECTED Final   Proteus species NOT  DETECTED NOT DETECTED Final   Salmonella species NOT DETECTED NOT DETECTED Final   Serratia marcescens NOT DETECTED NOT DETECTED Final   Haemophilus influenzae NOT DETECTED NOT DETECTED Final   Neisseria meningitidis NOT DETECTED NOT DETECTED Final   Pseudomonas aeruginosa NOT DETECTED NOT DETECTED Final   Stenotrophomonas maltophilia NOT DETECTED NOT DETECTED Final   Candida albicans NOT DETECTED NOT DETECTED Final   Candida auris NOT DETECTED NOT DETECTED Final   Candida glabrata NOT DETECTED NOT DETECTED Final   Candida krusei NOT DETECTED NOT DETECTED Final   Candida parapsilosis NOT DETECTED NOT DETECTED Final   Candida tropicalis NOT DETECTED NOT DETECTED Final   Cryptococcus neoformans/gattii NOT DETECTED NOT DETECTED Final    Comment: Performed at [facility], [place] [address] [place], [place] [address]  Resp Panel by RT-PCR (Flu A&B, Covid) Nasal Mucosa     Status: None   Collection Time: 08/11/21  6:30 PM   Specimen: Nasal Mucosa; Nasopharyngeal(NP) swabs in vial transport medium  Result Value Ref Range Status   SARS Coronavirus 2 by RT PCR NEGATIVE NEGATIVE Final    Comment: (NOTE) SARS-CoV-2 target nucleic acids are NOT DETECTED.  The SARS-CoV-2 RNA is generally detectable in upper respiratory specimens during the acute phase of infection. The lowest concentration of  SARS-CoV-2 viral copies this assay can detect is 138 copies/mL. A negative result does not preclude SARS-Cov-2 infection and should not be used as the sole basis for treatment or other patient management decisions. A negative result may occur with  improper specimen collection/handling, submission of specimen other than nasopharyngeal swab, presence of viral mutation(s) within the areas targeted by this assay, and inadequate number of viral copies(<138 copies/mL). A negative result must be combined with clinical observations, patient history, and epidemiological information. The expected result is Negative.  Fact Sheet for Patients:  EntrepreneurPulse.com.au  Fact Sheet for Healthcare Providers:  IncredibleEmployment.be  This test is no t yet approved or cleared by the [place] FDA and  has been authorized for detection and/or diagnosis of SARS-CoV-2 by FDA under an Emergency Use Authorization (EUA). This EUA will remain  in effect (meaning this test can be used) for the duration of the COVID-19 declaration under Section 564(b)(1) of the Act, 21 U.S.C.section 360bbb-3(b)(1), unless the authorization is terminated  or revoked sooner.       Influenza A by PCR NEGATIVE NEGATIVE Final   Influenza B by PCR NEGATIVE NEGATIVE Final    Comment: (NOTE) The Xpert Xpress SARS-CoV-2/FLU/RSV plus assay is intended as an aid in the diagnosis of influenza from Nasopharyngeal swab specimens and should not be used as a sole basis for treatment. Nasal washings and aspirates are unacceptable for Xpert Xpress SARS-CoV-2/FLU/RSV testing.  Fact Sheet for Patients: EntrepreneurPulse.com.au  Fact Sheet for Healthcare Providers: IncredibleEmployment.be  This test is not yet approved or cleared by the [place] FDA and has been authorized for detection and/or diagnosis of SARS-CoV-2 by FDA under an Emergency Use  Authorization (EUA). This EUA will remain in effect (meaning this test can be used) for the duration of the COVID-19 declaration under Section 564(b)(1) of the Act, 21 U.S.C. section 360bbb-3(b)(1), unless the authorization is terminated or revoked.  Performed at [facility], [address], [place], [place] [address]   MRSA Next Gen by PCR, Nasal     Status: None   Collection Time: 08/11/21  6:36 PM   Specimen: Nasal Mucosa; Nasal Swab  Result Value Ref Range Status   MRSA  by PCR Next Gen NOT DETECTED NOT DETECTED Final    Comment: (NOTE) The GeneXpert MRSA Assay (FDA approved for NASAL specimens only), is one component of a comprehensive MRSA colonization surveillance program. It is not intended to diagnose MRSA infection nor to guide or monitor treatment for MRSA infections. Test performance is not FDA approved in patients less than 59 years old. Performed at [facility], [address] [place], [place] [address]   Urine Culture     Status: None   Collection Time: 08/12/21 10:49 PM   Specimen: Urine, Clean Catch  Result Value Ref Range Status   Specimen Description   Final    URINE, CLEAN CATCH Performed at [facility], [address], [place], [place] [address]    Special Requests   Final    NONE Performed at [facility], [address] [place], [place] [address]    Culture   Final    NO GROWTH Performed at [facility], [place] [address], [place], [place] [address]    Report Status 08/14/2021 FINAL  Final     Labs: Basic Metabolic Panel: Recent Labs  Lab 08/12/21 0858 08/13/21 0345 08/14/21 0448 08/15/21 0514 08/16/21 0507  NA 136 132* 136 133* 137  K 4.0 3.9 4.1 4.4 4.3  CL 104 99 101 99 105  CO2 27 26 28 27 26   GLUCOSE 99 249* 106* 165* 164*  BUN 18 12 10 11 14   CREATININE 1.12* 1.10* 1.21* 1.42* 1.44*  CALCIUM 8.3* 8.0* 8.3* 7.5* 7.7*  MG  --  1.7 2.1 2.1  --    Liver Function Tests: Recent Labs  Lab 08/11/21 1451 08/13/21 0345  AST 14* 20   ALT 16 11  ALKPHOS 109 76  BILITOT 1.0 0.5  PROT 7.1 5.5*  ALBUMIN 2.7* 1.8*   Recent Labs  Lab 08/11/21 1451  LIPASE 41   CBC: Recent Labs  Lab 08/11/21 1451 08/13/21 0345 08/14/21 0448 08/15/21 0514  WBC 10.3 7.7 7.1 6.5  NEUTROABS 9.2*  --   --   --   HGB 14.7 10.4* 10.0* 9.6*  HCT 45.8 32.1* 31.0* 29.8*  MCV 93.5 91.5 91.2 90.6  PLT 289 356 361 377   BNP (last 3 results) Recent Labs    01/30/21 1007 03/02/21 1419 08/11/21 1451  BNP 76.0 89.0 192.0*   CBG: Recent Labs  Lab 08/15/21 1208 08/15/21 1623 08/15/21 2033 08/16/21 0759 08/16/21 1151  GLUCAP 105* 261* 78 183* 309*    Signed:  Barton Dubois MD.  [facility] 08/16/2021, 1:37 PM

## 2021-08-16 NOTE — Consult Note (Addendum)
Cardiology Consultation:   Patient ID: Diamond [name] MRN: [id number]; DOB: 1996/07/30  Admit date: 08/11/2021 Date of Consult: 08/16/2021  PCP:  [name], [place] [facility] Cardiologist:  Rozann Lesches, MD        Patient Profile:   Diamond [name] is a 25 y.o. female with a hx of DM-1, bipolar disorder, anasarca, Crohn's disease, HTN, nephrotic syndrome admitted 08/10/21 with N&V, abd pain and chest pain who is being seen 08/16/2021 for the evaluation of chest pain at the request of Dr. Dyann Kief.  History of Present Illness:   Diamond [name] was originally seen by Dr. Domenic Polite for possible cardiomyopathy in May of this year.  TEE done a month earlier with no valvular vegetations (pt had PNA and sepsis) EF 65-70%, with mod LVH, and appearance of myocardium suggesting potential of infiltrative process.   It was believed that the moderate LVH was mot likely due to HTN and D-1 but to rule our infiltrative or hypertrophic CM MRI was recommended.  Done 05/17/21 with EF 52%, mild LVH no RWMA in Rt or Lt ventricle. Mild LVH, no scar, no evidence for infiltrative disease or HCM.     Her renal function followed by Dr. Theador Hawthorne.  DM-1 followed by Dr. Dorris Fetch.  No further cardiac work-up planned at that point.  Now admitted 08/11/21 with nausea vomiting, hypoglycemia, abd pain, chest pain. Was ST on admit.  BNP 192.  CXR with bibasilar opacities Rt> Lt.  ABX started.  Started on IV insulin for hyperosmolar state and IV fluids.  + respiratory failure due to PNA and placed on 02.  DM-1 uncontrolled.  Glucose uncontrolled on admit and IV lopressor given for HTN with N&V.   With treatment pt developed volume overload and lasix given.   Pt's hs troponin   72;   106; 128; 132; 125; 110  then today hs troponin 18  EKG:  The EKG was personally reviewed and demonstrates:  08/12/21 ST 110 non specific ST abnormality but no changes from 04/07/21 Telemetry:  Telemetry was personally reviewed and  demonstrates:  not on a tele  Labs today 137, K+ 4.3, BUN 14, Cr 1.44 CA+ 7.7  Yesterday Hgb 9.6 WBC 6.5 plts 377.  Echo this admit EF 60-65%, no RWMA, mild LVH, RV normal.  Valves normal. Trivial pericardial effusion is present.  BP 124/83 P 98 R 19   She tells me she has had constant chest pain for 1 month.  Some tenderness to chest wall palpation.  When she climbs stirs really she crawls due to Lt leg weakness, but will develop sharp pain and has to lie down on stairs.    Past Medical History:  Diagnosis Date   ADHD (attention deficit hyperactivity disorder) 2007   Allergy    Arm DVT (deep venous thromboembolism), acute, right ([place]) 04/07/2021   Asthma    Bipolar disorder ([place])    Crohn's colitis ([place])    Dx Dr. Cherrie Gauze   Diabetic nephropathy [facility])    Discitis of lumbar region 05/29/2021   Edema    Eosinophilic esophagitis    GERD (gastroesophageal reflux disease)    Hypertension    MSSA bacteremia 04/07/2021   Nephrotic syndrome    Pancreatitis    Panic attacks    PTSD (post-traumatic stress disorder)    Stroke ([place]) 07/2020   R side   Type 1 diabetes ([place]) 2008    Past Surgical History:  Procedure Laterality Date   ADENOIDECTOMY  BIOPSY  02/01/2021   Procedure: BIOPSY;  Surgeon: Rogene Houston, MD;  [facility];  Service: Endoscopy;;  duodenal, esophageal,   BIOPSY  02/03/2021   Procedure: BIOPSY;  Surgeon: Eloise Harman, DO;  [facility];  Service: Endoscopy;;   COLONOSCOPY WITH PROPOFOL N/A 02/01/2021   Procedure: COLONOSCOPY WITH PROPOFOL;  Surgeon: Rogene Houston, MD;  [facility];  Service: Endoscopy;  Laterality: N/A;   COLONOSCOPY WITH PROPOFOL N/A 02/03/2021   Procedure: COLONOSCOPY WITH PROPOFOL;  Surgeon: Eloise Harman, DO;  [facility];  Service: Endoscopy;  Laterality: N/A;   ESOPHAGOGASTRODUODENOSCOPY (EGD) WITH PROPOFOL N/A 02/01/2021   Procedure: ESOPHAGOGASTRODUODENOSCOPY (EGD) WITH  PROPOFOL;  Surgeon: Rogene Houston, MD;  [facility];  Service: Endoscopy;  Laterality: N/A;   HERNIA REPAIR     TEE WITHOUT CARDIOVERSION N/A 03/09/2021   Procedure: TRANSESOPHAGEAL ECHOCARDIOGRAM (TEE) WITH PROPOFOL;  Surgeon: Arnoldo Lenis, MD;  [facility];  Service: Endoscopy;  Laterality: N/A;   TONSILLECTOMY       Home Medications:  Prior to Admission medications   Medication Sig Start Date End Date Taking? Authorizing Provider  acetaminophen (TYLENOL) 500 MG tablet Take by mouth. 04/21/20  Yes [provider]  albuterol (VENTOLIN HFA) 108 (90 Base) MCG/ACT inhaler Inhale 2 puffs into the lungs every 6 (six) hours as needed for wheezing or shortness of breath. For shortness of breath 03/10/21  Yes Emokpae, Courage, MD  furosemide (LASIX) 20 MG tablet Take 60 mg by mouth daily. 04/25/20  Yes [provider]  gabapentin (NEURONTIN) 100 MG capsule Take 1 capsule (100 mg total) by mouth 2 (two) times daily. 03/10/21  Yes Emokpae, Courage, MD  hydrALAZINE (APRESOLINE) 25 MG tablet Take 1 tablet (25 mg total) by mouth 2 (two) times daily. 03/10/21  Yes Emokpae, Courage, MD  hydrochlorothiazide (HYDRODIURIL) 25 MG tablet Take 25 mg by mouth daily. 02/11/20  Yes [provider]  insulin glargine (LANTUS) 100 UNIT/ML injection Inject 40 Units into the skin at bedtime.   Yes [provider]  Insulin Glulisine (APIDRA [place]) Inject 10-16 Units into the skin 3 (three) times daily before meals. ssi   Yes [provider]  isosorbide mononitrate (IMDUR) 30 MG 24 hr tablet Take 0.5 tablets (15 mg total) by mouth daily. Patient taking differently: Take 30 mg by mouth daily. 03/19/21  Yes Barton Dubois, MD  metoCLOPramide (REGLAN) 5 MG tablet Take 1 tablet (5 mg total) by mouth every 8 (eight) hours as needed for refractory nausea / vomiting (refractory symptoms). 03/19/21 03/19/22 Yes Barton Dubois, MD  metoprolol tartrate (LOPRESSOR) 50 MG  tablet Take 1 tablet (50 mg total) by mouth 2 (two) times daily. 03/23/21  Yes [name], PA-C  omeprazole (PRILOSEC) 40 MG capsule Take 1 capsule (40 mg total) by mouth in the morning and at bedtime. 03/23/21  Yes [name], PA-C  potassium chloride (KLOR-CON) 10 MEQ tablet Take 10 mEq by mouth daily. 10/21/20  Yes [provider]  promethazine (PHENERGAN) 25 MG tablet Take 1 tablet (25 mg total) by mouth every 8 (eight) hours as needed for nausea or vomiting. 04/18/21  Yes [name], PA-C  loperamide (IMODIUM A-D) 2 MG tablet Take 1 tablet (2 mg total) by mouth 4 (four) times daily as needed for diarrhea or loose stools. Patient not taking: No sig reported 04/02/21   Manuella Ghazi, Pratik D, DO  ondansetron (ZOFRAN ODT) 8 MG disintegrating tablet  Take 1 tablet (8 mg total) by mouth every 8 (eight) hours as needed for nausea or vomiting. Patient not taking: No sig reported 03/10/21   Roxan Hockey, MD    Inpatient Medications: Scheduled Meds:  Chlorhexidine Gluconate Cloth  6 each Topical Daily   enoxaparin (LOVENOX) injection  40 mg Subcutaneous Q24H   hydrALAZINE  25 mg Oral Q8H   insulin aspart  0-5 Units Subcutaneous QHS   insulin aspart  0-6 Units Subcutaneous TID WC   insulin aspart  2 Units Subcutaneous TID WC   insulin glargine-yfgn  16 Units Subcutaneous Daily   isosorbide mononitrate  30 mg Oral Daily   lidocaine  1 patch Transdermal Q24H   metoCLOPramide (REGLAN) injection  5 mg Intravenous TID AC & HS   metoprolol tartrate  50 mg Oral BID   pantoprazole  40 mg Oral BID   sodium chloride flush  10-40 mL Intracatheter Q12H   Continuous Infusions:  sodium chloride 125 mL/hr at 08/15/21 2108   levofloxacin (LEVAQUIN) IV Stopped (08/15/21 0950)   PRN Meds: acetaminophen, dextrose, ondansetron (ZOFRAN) IV, oxyCODONE, prochlorperazine, sodium chloride flush  Allergies:    Allergies  Allergen Reactions   Amoxicillin Hives    Did it involve swelling of the  face/tongue/throat, SOB, or low BP? no  Did it involve sudden or severe rash/hives, skin peeling, or any reaction on the inside of your mouth or nose? no Did you need to seek medical attention at a hospital or doctor's office? no When did it last happen?   unk    If all above answers are "NO", may proceed with cephalosporin use.   Dexamethasone Hives and Itching   Doxycycline Itching and Swelling   Penicillins Hives and Itching    Did it involve swelling of the face/tongue/throat, SOB, or low BP? no  Did it involve sudden or severe rash/hives, skin peeling, or any reaction on the inside of your mouth or nose? no Did you need to seek medical attention at a hospital or doctor's office? no When did it last happen?   unk    If all above answers are "NO", may proceed with cephalosporin use. Did it involve swelling of the face/tongue/throat, SOB, or low BP? no  Did it involve sudden or severe rash/hives, skin peeling, or any reac   Ceftriaxone Sodium In Dextrose Itching   Augmentin [Amoxicillin-Pot Clavulanate] Other (See Comments)    Reaction unknown   Clavulanic Acid    Adhesive [Tape] Itching and Rash   Latex Itching and Rash   Other Itching and Rash   Pineapple Rash    Rash on tongue and throat    Social History:   Social History   Tobacco Use   Smoking status: Never   Smokeless tobacco: Never  Substance Use Topics   Alcohol use: No     Family History:    Family History  Problem Relation Age of Onset   Hypertension Mother    Diabetes Mother    Heart disease Mother    Crohn's disease Father    Alcohol abuse Father    Drug abuse Father    Mental illness Father    Cancer Father    Hypertension Maternal Aunt    Seizures Maternal Aunt    Depression Maternal Aunt    Asthma Maternal Uncle    Hypertension Maternal Uncle    Cancer Maternal Grandmother    Diabetes Maternal Grandmother    Mental illness Maternal Grandfather      ROS:  Please see the history of present  illness.  General:no colds or fevers, no weight changes Skin:no rashes or ulcers HEENT:no blurred vision, no congestion CV:see HPI PUL:see HPI GI:no diarrhea constipation or melena, no indigestion, + N&V GU:no hematuria, no dysuria MS:no joint pain, no claudication Neuro:no syncope, no lightheadedness Endo:++ diabetes, no thyroid disease  All other ROS reviewed and negative.     Physical Exam/Data:   Vitals:   08/15/21 2104 08/15/21 2114 08/16/21 0108 08/16/21 0536  BP: 112/83  118/63 124/83  Pulse: (!) 106  (!) 104 98  Resp: 19  20 19   Temp:  98.7 F (37.1 C)  98.4 F (36.9 C)  TempSrc:      SpO2: 97%  92% 96%  Weight:      Height:        Intake/Output Summary (Last 24 hours) at 08/16/2021 0918 Last data filed at 08/16/2021 0300 Gross per 24 hour  Intake 2818.22 ml  Output --  Net 2818.22 ml   Last 3 Weights 08/11/2021 08/11/2021 07/03/2021  Weight (lbs) 142 lb 13.7 oz 148 lb 3.2 oz 118 lb  Weight (kg) 64.8 kg 67.223 kg 53.524 kg     Body mass index is 28.85 kg/m.  General:  Well nourished, well developed, in no acute distress HEENT: normal Lymph: no adenopathy Neck: no JVD Endocrine:  No thryomegaly Vascular: No carotid bruits; FA pulses 2+ bilaterally without bruits  Cardiac:  normal S1, S2; RRR; no murmur gallup rub or click, + chest wall tenderness to palpation  Lungs:  clear to auscultation bilaterally, no wheezing, rhonchi or rales  Abd: soft, nontender, no hepatomegaly + very tender to palpation.  Ext: no edema Musculoskeletal:  No deformities, BUE and BLE strength normal and equal Skin: warm and dry  Neuro:  CNs 2-12 intact, no focal abnormalities noted Psych:  Normal affect   Relevant CV Studies: Echo 08/12/21 IMPRESSIONS     1. Left ventricular ejection fraction, by estimation, is 60 to 65%. The  left ventricle has normal function. The left ventricle has no regional  wall motion abnormalities. There is mild left ventricular hypertrophy.  Left  ventricular diastolic parameters  were normal.   2. Right ventricular systolic function is normal. The right ventricular  size is normal. There is normal pulmonary artery systolic pressure.   3. The mitral valve is normal in structure. No evidence of mitral valve  regurgitation. No evidence of mitral stenosis.   4. The aortic valve is tricuspid. Aortic valve regurgitation is not  visualized. No aortic stenosis is present.   5. The inferior vena cava is normal in size with greater than 50%  respiratory variability, suggesting right atrial pressure of 3 mmHg.   FINDINGS   Left Ventricle: Left ventricular ejection fraction, by estimation, is 60  to 65%. The left ventricle has normal function. The left ventricle has no  regional wall motion abnormalities. The left ventricular internal cavity  size was normal in size. There is   mild left ventricular hypertrophy. Left ventricular diastolic parameters  were normal.   Right Ventricle: The right ventricular size is normal. Right ventricular  systolic function is normal. There is normal pulmonary artery systolic  pressure. The tricuspid regurgitant velocity is 2.03 m/s, and with an  assumed right atrial pressure of 3 mmHg,   the estimated right ventricular systolic pressure is 24.2 mmHg.   Left Atrium: Left atrial size was normal in size.   Right Atrium: Right atrial size was normal in size.  Pericardium: Trivial pericardial effusion is present.   Mitral Valve: The mitral valve is normal in structure. No evidence of  mitral valve regurgitation. No evidence of mitral valve stenosis.   Tricuspid Valve: The tricuspid valve is normal in structure. Tricuspid  valve regurgitation is trivial. No evidence of tricuspid stenosis.   Aortic Valve: The aortic valve is tricuspid. Aortic valve regurgitation is  not visualized. No aortic stenosis is present. Aortic valve mean gradient  measures 3.0 mmHg. Aortic valve peak gradient measures 3.5 mmHg.    Pulmonic Valve: The pulmonic valve was normal in structure. Pulmonic valve  regurgitation is not visualized. No evidence of pulmonic stenosis.   Aorta: The aortic root is normal in size and structure.   Venous: The inferior vena cava is normal in size with greater than 50%  respiratory variability, suggesting right atrial pressure of 3 mmHg.   IAS/Shunts: No atrial level shunt detected by color flow Doppler.      LEFT VENTRICLE  PLAX 2D  LVIDd:         3.43 cm  Diastology  LVIDs:         2.15 cm  LV e' lateral:   11.70 cm/s  LV PW:         1.12 cm  LV E/e' lateral: 5.8  LV IVS:        0.98 cm  LVOT diam:     1.60 cm  LVOT Area:     2.01 cm      RIGHT VENTRICLE  RV S prime:     12.70 cm/s  TAPSE (M-mode): 1.8 cm   LEFT ATRIUM             Index       RIGHT ATRIUM           Index  LA diam:        2.60 cm 1.63 cm/m  RA Area:     12.30 cm  LA Vol (A2C):   20.3 ml 12.70 ml/m RA Volume:   26.40 ml  16.51 ml/m  LA Vol (A4C):   18.6 ml 11.64 ml/m  LA Biplane Vol: 20.9 ml 13.07 ml/m   AORTIC VALVE  AV Vmax:      94.00 cm/s  AV Vmean:     79.500 cm/s  AV VTI:       0.162 m  AV Peak Grad: 3.5 mmHg  AV Mean Grad: 3.0 mmHg     AORTA  Ao Root diam: 2.40 cm  Ao Asc diam:  2.50 cm   MITRAL VALVE               TRICUSPID VALVE  MV Area (PHT): 6.12 cm    TR Peak grad:   16.5 mmHg  MV Decel Time: 124 msec    TR Vmax:        203.00 cm/s  MV E velocity: 67.90 cm/s  MV A velocity: 66.20 cm/s  SHUNTS  MV E/A ratio:  1.03        Systemic Diam: 1.60 cm   Kirk Ruths MD  Electronically signed by Kirk Ruths MD  Signature Date/Time: 08/12/2021/12:32:31 PM         Final       Cardiac MRI  IMPRESSION: 1.  Normal LV and RV size and function.  LVEF 52%   2.  Mild left ventricular hypertrophy   3.  No evidence for late gadolinium enhancement or scar   4.  No evidence for infiltrative disease or  HCM   Kate Sable Laboratory Data:  High Sensitivity Troponin:    Recent Labs  Lab 08/11/21 1845 08/11/21 2139 08/12/21 0120 08/12/21 0400 08/16/21 0507  TROPONINIHS 128* 132* 125* 110* 18*     Chemistry Recent Labs  Lab 08/14/21 0448 08/15/21 0514 08/16/21 0507  NA 136 133* 137  K 4.1 4.4 4.3  CL 101 99 105  CO2 28 27 26   GLUCOSE 106* 165* 164*  BUN 10 11 14   CREATININE 1.21* 1.42* 1.44*  CALCIUM 8.3* 7.5* 7.7*  GFRNONAA >60 53* 52*  ANIONGAP 7 7 6     Recent Labs  Lab 08/11/21 1451 08/13/21 0345  PROT 7.1 5.5*  ALBUMIN 2.7* 1.8*  AST 14* 20  ALT 16 11  ALKPHOS 109 76  BILITOT 1.0 0.5   Hematology Recent Labs  Lab 08/13/21 0345 08/14/21 0448 08/15/21 0514  WBC 7.7 7.1 6.5  RBC 3.51* 3.40* 3.29*  HGB 10.4* 10.0* 9.6*  HCT 32.1* 31.0* 29.8*  MCV 91.5 91.2 90.6  MCH 29.6 29.4 29.2  MCHC 32.4 32.3 32.2  RDW 12.6 12.3 12.2  PLT 356 361 377   BNP Recent Labs  Lab 08/11/21 1451  BNP 192.0*    DDimer  Recent Labs  Lab 08/11/21 1845  DDIMER 0.80*     Radiology/Studies:  CT ABDOMEN PELVIS W CONTRAST  Result Date: 08/12/2021 CLINICAL DATA:  Acute nonlocalized abdominal pain in this patient with history of Crohn's disease with chronic epigastric and pelvic pain also with nausea and vomiting that began on 08/10/2021. EXAM: CT ABDOMEN AND PELVIS WITH CONTRAST TECHNIQUE: Multidetector CT imaging of the abdomen and pelvis was performed using the standard protocol following bolus administration of intravenous contrast. CONTRAST:  86m OMNIPAQUE IOHEXOL 350 MG/ML SOLN COMPARISON:  Comparison made with July 18, 2021. FINDINGS: Lower chest: Small bilateral pleural effusions have developed since the prior study along with patchy areas of ground-glass opacity in the chest. Hepatobiliary: Liver with smooth contours. Patent portal vein. No gallbladder wall thickening or gross biliary duct distension. Area of hyperenhancement along the margin of the RIGHT hemi liver away from the gallbladder fossa measures approximately 1.8 cm  greatest axial and coronal dimension (image 49/2) Pancreas: Normal, without mass, inflammation or ductal dilatation. Spleen: Normal. Adrenals/Urinary Tract: Adrenal glands are normal. Area of hypodensity in the lower pole the RIGHT kidney with loss of normal corticomedullary distinction on image 46 of series 2 involving the lower anterior RIGHT kidney. No change in contour associated with this finding at this time. Renal vein is patent. Normal appearance of the LEFT kidney. No hydronephrosis. No perinephric stranding. Marked stranding about the urinary bladder which is moderate lead to markedly distended which extends up into the lower abdomen. Stomach/Bowel: Gastric and esophageal thickening, diffuse thickening is suggested on the current study. No signs of small bowel obstruction. The appendix is normal. Colon is decompressed. Vascular/Lymphatic: Normal caliber of the abdominal aorta. Patent abdominal vessels as well as can be assessed on early venous phase. There is no gastrohepatic or hepatoduodenal ligament lymphadenopathy. No retroperitoneal or mesenteric lymphadenopathy. Mildly prominent pelvic lymph nodes are unchanged including bilateral inguinal lymph nodes since previous imaging. These are likely reactive. Reproductive: Unremarkable by CT. Other: Small volume fluid in the abdomen in the pelvis. Diffuse body wall edema. Diffuse mesenteric edema.  Small fat containing umbilical hernia. Musculoskeletal: No acute musculoskeletal process. No destructive bone finding. IMPRESSION: Marked anasarca which has developed since the previous study. Patchy bilateral ground-glass and subpleural banding. Findings are suggestive of  atypical pulmonary infection. Correlate with respiratory symptoms and any history of COVID-19 infection in particular. Signs of suspected focal nephritis involving the lower pole the RIGHT kidney. This is seen in the context of markedly distended and somewhat thickened urinary bladder. Would  correlate with signs of cystitis and urinalysis as well with signs of bladder outlet obstruction. Findings in the RIGHT kidney are worse than on previous imaging and persistent since prior study of August. For this reason would suggest follow-up to exclude underlying mass in this location. Focal area of hyperenhancement in the RIGHT hemi liver perhaps related to vascular phenomenon given focal appearance lesion not excluded, underlying would consider abdominal sonogram for follow-up of both renal and liver findings. This would be best performed outside of the acute setting. Findings that suggest esophagitis and gastritis. Normal appendix. Electronically Signed   By: Zetta Bills M.D.   On: 08/12/2021 16:56   ECHOCARDIOGRAM COMPLETE  Result Date: 08/12/2021    ECHOCARDIOGRAM REPORT   Patient Name:   Diamond [name] Date of Exam: 08/12/2021 Medical Rec #:  [id number]        Height:       59.0 in Accession #:    [id number]       Weight:       142.9 lb Date of Birth:  [birth year]        BSA:          1.599 m Patient Age:    25 years         BP:           112/74 mmHg Patient Gender: F                HR:           99 bpm. Exam Location:  [facility] Procedure: 2D Echo, Cardiac Doppler and Color Doppler Indications:    R07.9* Chest pain, unspecified  History:        Patient has prior history of Echocardiogram examinations, most                 recent 03/09/2021. Stroke; Risk Factors:Hypertension and Diabetes.  Sonographer:    Bernadene Person RDCS Referring Phys: [phone] DAVID TAT IMPRESSIONS  1. Left ventricular ejection fraction, by estimation, is 60 to 65%. The left ventricle has normal function. The left ventricle has no regional wall motion abnormalities. There is mild left ventricular hypertrophy. Left ventricular diastolic parameters were normal.  2. Right ventricular systolic function is normal. The right ventricular size is normal. There is normal pulmonary artery systolic pressure.  3. The mitral valve is normal in  structure. No evidence of mitral valve regurgitation. No evidence of mitral stenosis.  4. The aortic valve is tricuspid. Aortic valve regurgitation is not visualized. No aortic stenosis is present.  5. The inferior vena cava is normal in size with greater than 50% respiratory variability, suggesting right atrial pressure of 3 mmHg. FINDINGS  Left Ventricle: Left ventricular ejection fraction, by estimation, is 60 to 65%. The left ventricle has normal function. The left ventricle has no regional wall motion abnormalities. The left ventricular internal cavity size was normal in size. There is  mild left ventricular hypertrophy. Left ventricular diastolic parameters were normal. Right Ventricle: The right ventricular size is normal. Right ventricular systolic function is normal. There is normal pulmonary artery systolic pressure. The tricuspid regurgitant velocity is 2.03 m/s, and with an assumed right atrial pressure of 3 mmHg,  the estimated right ventricular systolic pressure is  19.5 mmHg. Left Atrium: Left atrial size was normal in size. Right Atrium: Right atrial size was normal in size. Pericardium: Trivial pericardial effusion is present. Mitral Valve: The mitral valve is normal in structure. No evidence of mitral valve regurgitation. No evidence of mitral valve stenosis. Tricuspid Valve: The tricuspid valve is normal in structure. Tricuspid valve regurgitation is trivial. No evidence of tricuspid stenosis. Aortic Valve: The aortic valve is tricuspid. Aortic valve regurgitation is not visualized. No aortic stenosis is present. Aortic valve mean gradient measures 3.0 mmHg. Aortic valve peak gradient measures 3.5 mmHg. Pulmonic Valve: The pulmonic valve was normal in structure. Pulmonic valve regurgitation is not visualized. No evidence of pulmonic stenosis. Aorta: The aortic root is normal in size and structure. Venous: The inferior vena cava is normal in size with greater than 50% respiratory variability,  suggesting right atrial pressure of 3 mmHg. IAS/Shunts: No atrial level shunt detected by color flow Doppler.  LEFT VENTRICLE PLAX 2D LVIDd:         3.43 cm  Diastology LVIDs:         2.15 cm  LV e' lateral:   11.70 cm/s LV PW:         1.12 cm  LV E/e' lateral: 5.8 LV IVS:        0.98 cm LVOT diam:     1.60 cm LVOT Area:     2.01 cm  RIGHT VENTRICLE RV S prime:     12.70 cm/s TAPSE (M-mode): 1.8 cm LEFT ATRIUM             Index       RIGHT ATRIUM           Index LA diam:        2.60 cm 1.63 cm/m  RA Area:     12.30 cm LA Vol (A2C):   20.3 ml 12.70 ml/m RA Volume:   26.40 ml  16.51 ml/m LA Vol (A4C):   18.6 ml 11.64 ml/m LA Biplane Vol: 20.9 ml 13.07 ml/m  AORTIC VALVE AV Vmax:      94.00 cm/s AV Vmean:     79.500 cm/s AV VTI:       0.162 m AV Peak Grad: 3.5 mmHg AV Mean Grad: 3.0 mmHg  AORTA Ao Root diam: 2.40 cm Ao Asc diam:  2.50 cm MITRAL VALVE               TRICUSPID VALVE MV Area (PHT): 6.12 cm    TR Peak grad:   16.5 mmHg MV Decel Time: 124 msec    TR Vmax:        203.00 cm/s MV E velocity: 67.90 cm/s MV A velocity: 66.20 cm/s  SHUNTS MV E/A ratio:  1.03        Systemic Diam: 1.60 cm Kirk Ruths MD Electronically signed by Kirk Ruths MD Signature Date/Time: 08/12/2021/12:32:31 PM    Final      Assessment and Plan:   Atypical chest pain for a month that is nearly constant.  Some is squeezing pain other times sharp or stinging, at times like a battery shock, at times Lt breast swells with pain. .  Does not matter position.  Flat troponins not suggestive of ACS.  EKG without acute changes, echo without RWMA.  Most likely musculoskeletal or inflammatory in etiology.  Dr. Domenic Polite to see but most likely no ischemic work up. 2.  N&V/respiratory failure/hyperosmolar/Bipolar disease state per IM     Risk Assessment/Risk Scores:  HEAR Score (for undifferentiated chest pain):  HEAR Score: 4          For questions or updates, please contact [place] Please consult [email or web address]  for contact info under    Signed, Cecilie Kicks, NP  08/16/2021 9:18 AM    Attending note:  Patient seen and examined.  I reviewed her records and discussed the case with Ms. Dorene Ar NP, I agree with her above findings.  Cardiology consult requested for evaluation of chest pain and minor elevation in high-sensitivity troponin I.  Diamond [name] is currently admitted with recent nausea and emesis in association with abdominal pain.  She has also had fairly prolonged and nearly constant chest discomfort over the last month, not purely exertional or positional.  She describes this in many different ways from a squeezing pain to more of a sharp discomfort at times, a sensation of "sticking your tongue to a battery," also associated left breast swelling and some tenderness to palpation of the chest.  On examination she is in no acute distress.  Afebrile, blood pressure normal.  Heart rate is in the 90s, currently not on telemetry.  Lungs are clear.  Cardiac exam reveals RRR with S4, no pericardial rub.  Pertinent lab work includes potassium 4.3, BUN 14, creatinine 1.44, most recent high-sensitivity troponin I 18, hemoglobin 9.6, platelets 377.  Echocardiogram demonstrates normal LVEF at 60 to 65% without regional wall motion abnormalities, mild LVH, no significant valvular abnormalities, and a trivial pericardial effusion.  I personally reviewed her ECG from September 9 which showed sinus tachycardia.  Chest x-ray reports patchy bibasilar opacities worse on the right than the left.  Of note, chest CT from May reported no coronary artery calcification.  Prior cardiac MRI did not show any evidence of hypertrophic/infiltrative cardiomyopathy.  Atypical chest pain not suggestive of angina, more likely musculoskeletal or inflammatory etiology based on description and comorbidities.  Echocardiogram shows normal LVEF without wall motion abnormalities, trivial pericardial effusion noted.  Mild elevation in  high-sensitivity troponin I is not suggestive of ACS.  No further ischemic work-up is planned at this point.  Satira Sark, M.D., F.A.C.C.

## 2021-08-16 NOTE — Telephone Encounter (Signed)
Client called this evening stating she needed transportation to her appt with Nephrologist Dr Theador Hawthorne on 08/17/21 at 140PM. She states she called transportation and was told they could not transport her.  This RN will call [employer], but reminded client that this is late notice to arrange for tomorrow, I will attempt and call her back. *called [employer] and provided appointment information, transportation will attempt to schedule with their drivers or possibly see if an outside vendor could accommodate, however it is late and they are not sure. They will contact client by 6pm to let her know if they can arrange transportation. This RN called client to notify her of above, client reports understanding.  Client notifies this RN of her recent hospital admission for DKA/Pneumonia. Discharged today. Reinforced the importance of monitoring her blood sugars as directed by [name] and keeping her logs. Eating a balanced Diabetic friendly low salt diet. Reinforced the risks of uncontrolled blood sugars and the effects on her organs and health, that following up with all her specialists and providers is necessary for health and wellness. Will plan another phone follow up tomorrow to discuss that she needs to renew [facility] as well as [facility]. Also will recommend client call her primary care provider for a post hospital follow up as her next appt is in November and her appointment with [name] is October.  Mayra Reel RN [employer]

## 2021-08-17 ENCOUNTER — Encounter (HOSPITAL_COMMUNITY): Payer: Self-pay

## 2021-08-17 ENCOUNTER — Emergency Department (HOSPITAL_COMMUNITY)
Admission: EM | Admit: 2021-08-17 | Discharge: 2021-08-17 | Disposition: A | Discharge status: Home / Self Care | Payer: Self-pay | Attending: Emergency Medicine | Admitting: Emergency Medicine

## 2021-08-17 ENCOUNTER — Emergency Department (HOSPITAL_COMMUNITY): Payer: Self-pay

## 2021-08-17 ENCOUNTER — Encounter (HOSPITAL_COMMUNITY): Payer: Medicaid Other

## 2021-08-17 ENCOUNTER — Other Ambulatory Visit: Payer: Self-pay

## 2021-08-17 DIAGNOSIS — Z91040 Latex allergy status: Secondary | ICD-10-CM | POA: Insufficient documentation

## 2021-08-17 DIAGNOSIS — M7989 Other specified soft tissue disorders: Secondary | ICD-10-CM | POA: Insufficient documentation

## 2021-08-17 DIAGNOSIS — E109 Type 1 diabetes mellitus without complications: Secondary | ICD-10-CM | POA: Insufficient documentation

## 2021-08-17 DIAGNOSIS — R0789 Other chest pain: Secondary | ICD-10-CM

## 2021-08-17 DIAGNOSIS — Z794 Long term (current) use of insulin: Secondary | ICD-10-CM | POA: Insufficient documentation

## 2021-08-17 DIAGNOSIS — J811 Chronic pulmonary edema: Secondary | ICD-10-CM | POA: Insufficient documentation

## 2021-08-17 DIAGNOSIS — J4520 Mild intermittent asthma, uncomplicated: Secondary | ICD-10-CM | POA: Insufficient documentation

## 2021-08-17 DIAGNOSIS — I10 Essential (primary) hypertension: Secondary | ICD-10-CM | POA: Insufficient documentation

## 2021-08-17 DIAGNOSIS — Z79899 Other long term (current) drug therapy: Secondary | ICD-10-CM | POA: Insufficient documentation

## 2021-08-17 LAB — CBC WITH DIFFERENTIAL/PLATELET
Abs Immature Granulocytes: 0.15 10*3/uL — ABNORMAL HIGH (ref 0.00–0.07)
Basophils Absolute: 0.1 10*3/uL (ref 0.0–0.1)
Basophils Relative: 1 %
Eosinophils Absolute: 0.7 10*3/uL — ABNORMAL HIGH (ref 0.0–0.5)
Eosinophils Relative: 8 %
HCT: 30.4 % — ABNORMAL LOW (ref 36.0–46.0)
Hemoglobin: 9.7 g/dL — ABNORMAL LOW (ref 12.0–15.0)
Immature Granulocytes: 2 %
Lymphocytes Relative: 32 %
Lymphs Abs: 2.6 10*3/uL (ref 0.7–4.0)
MCH: 30.3 pg (ref 26.0–34.0)
MCHC: 31.9 g/dL (ref 30.0–36.0)
MCV: 95 fL (ref 80.0–100.0)
Monocytes Absolute: 0.6 10*3/uL (ref 0.1–1.0)
Monocytes Relative: 7 %
Neutro Abs: 4.2 10*3/uL (ref 1.7–7.7)
Neutrophils Relative %: 50 %
Platelets: 446 10*3/uL — ABNORMAL HIGH (ref 150–400)
RBC: 3.2 MIL/uL — ABNORMAL LOW (ref 3.87–5.11)
RDW: 13 % (ref 11.5–15.5)
WBC: 8.3 10*3/uL (ref 4.0–10.5)
nRBC: 0 % (ref 0.0–0.2)

## 2021-08-17 LAB — COMPREHENSIVE METABOLIC PANEL
ALT: 14 U/L (ref 0–44)
AST: 23 U/L (ref 15–41)
Albumin: 2.1 g/dL — ABNORMAL LOW (ref 3.5–5.0)
Alkaline Phosphatase: 85 U/L (ref 38–126)
Anion gap: 3 — ABNORMAL LOW (ref 5–15)
BUN: 12 mg/dL (ref 6–20)
CO2: 27 mmol/L (ref 22–32)
Calcium: 8.1 mg/dL — ABNORMAL LOW (ref 8.9–10.3)
Chloride: 107 mmol/L (ref 98–111)
Creatinine, Ser: 1.15 mg/dL — ABNORMAL HIGH (ref 0.44–1.00)
GFR, Estimated: >60 mL/min (ref >60)
Glucose, Bld: 175 mg/dL — ABNORMAL HIGH (ref 70–99)
Potassium: 4.1 mmol/L (ref 3.5–5.1)
Sodium: 137 mmol/L (ref 135–145)
Total Bilirubin: 1 mg/dL (ref 0.3–1.2)
Total Protein: 6.1 g/dL — ABNORMAL LOW (ref 6.5–8.1)

## 2021-08-17 LAB — CBG MONITORING, ED: Glucose-Capillary: 128 mg/dL — ABNORMAL HIGH (ref 70–99)

## 2021-08-17 LAB — TROPONIN I (HIGH SENSITIVITY)
Troponin I (High Sensitivity): 16 ng/L (ref <18)
Troponin I (High Sensitivity): 13 ng/L (ref <18)

## 2021-08-17 MED ORDER — FUROSEMIDE 40 MG PO TABS: 40.0000 mg | ORAL_TABLET | Freq: Two times a day (BID) | ORAL | 1 refills | Status: DC

## 2021-08-17 MED ORDER — IOHEXOL 350 MG/ML SOLN
75.0000 mL | Freq: Once | INTRAVENOUS | Status: AC | PRN
  Administered 2021-08-17: 75 mL via INTRAVENOUS

## 2021-08-17 NOTE — ED Notes (Signed)
Pt provided discharge instructions and prescription information. Pt was given the opportunity to ask questions and questions were answered. Discharge signature not obtained in the setting of the COVID-19 pandemic in order to reduce high touch surfaces.

## 2021-08-17 NOTE — ED Provider Notes (Signed)
[facility] EMERGENCY DEPARTMENT Provider Note   CSN: [id number] Arrival date & time: 08/17/21  1524     History Chief Complaint  Patient presents with   Chest Pain    KERRYN TENNANT is a 25 y.o. female.  Patient referred in by nephrology.  Patient was admitted recently September 9 through September 14.  During that admission patient was noted to have community-acquired pneumonia and had some respiratory failure.  Also had nonketotic hyperosmolar state.  And acute kidney injury.  Patient's GFR yesterday was in the 50s.  Patient states she is got some leg swelling.  Patient having bilateral upper chest pain since last night.  Worse with taking deep breaths associated with shortness of breath.  And has increased swelling in her legs.  Oxygen saturation on room air is 94%.  Patient referred in by nephrology and your primary care doctor for the symptoms.  Patient was discharged home on antibiotics for the pneumonia.  And they did increase her Lasix they doubled it today.      Past Medical History:  Diagnosis Date   ADHD (attention deficit hyperactivity disorder) 2007   Allergy    Arm DVT (deep venous thromboembolism), acute, right ([place]) 04/07/2021   Asthma    Bipolar disorder ([place])    Crohn's colitis ([place])    Dx Dr. Cherrie Gauze   Diabetic nephropathy [facility])    Discitis of lumbar region 05/29/2021   Edema    Eosinophilic esophagitis    GERD (gastroesophageal reflux disease)    Hypertension    MSSA bacteremia 04/07/2021   Nephrotic syndrome    Pancreatitis    Panic attacks    PTSD (post-traumatic stress disorder)    Stroke ([place]) 07/2020   R side   Type 1 diabetes ([place]) 2008    Patient Active Problem List   Diagnosis Date Noted   Atypical chest pain    Elevated troponin    Diabetic ketoacidosis without coma associated with type 1 diabetes mellitus ([place])    DKA, type 1 ([place]) 08/11/2021   Discitis of lumbar region 05/29/2021   Diarrhea 04/11/2021   Upper abdominal  pain 40/98/1191   Eosinophilic esophagitis 47/82/9562   MSSA bacteremia 04/07/2021   Arm DVT (deep venous thromboembolism), acute, right ([place]) 04/07/2021   AKI (acute kidney injury) ([place]) 04/01/2021   Pyelonephritis 03/31/2021   Severe low back pain    Intractable nausea and vomiting 03/16/2021   Nausea with vomiting    DM1 with Diabetic nephropathy with proteinuria and Nephrotic Syndrome- 03/10/2021    Class: Chronic   Community acquired pneumonia 03/05/2021   Type 2 or unspecified type diabetes mellitus 02/23/2021   Bladder outlet obstruction    Constipation    Abdominal pain, epigastric    Abnormal computed tomography of cecum and terminal ileum    Hyperosmolar hyperglycemic state (HHS) ([place]) 01/30/2021   Crohn's disease ([place]) 12/08/2020   Diabetic gastroparesis ([place]) 12/08/2020   Essential hypertension, benign 11/01/2020   Edema 10/19/2020   Anasarca 10/18/2020   Hypoalbuminemia-due to proteinuria/nephrotic syndrome 10/18/2020   Personal history of noncompliance with medical treatment, presenting hazards to health 10/12/2020   Bilateral lower extremity edema 08/25/2020   Pneumonia 08/25/2020   Ileus ([place]) 08/06/2020   Methicillin susceptible Staphylococcus aureus infection, unspecified site 07/08/2020   Cellulitis and abscess of buttock 06/20/2020   Skin abscess 04/24/2020   Mild intermittent asthma without complication 13/07/6577   Sepsis due to skin infection ([place]) 04/17/2020   Type 1 diabetes mellitus, uncontrolled ([place])  10/01/2013   Eczema 10/23/2012   Abdominal pain 08/27/2012   GERD (gastroesophageal reflux disease) 07/25/2012   Mood disorder ([place]) 07/09/2012   Contraception management 07/09/2012   History of Crohn's disease 07/09/2012   Uncontrolled type 1 diabetes mellitus with hyperglycemia and Diabetic Nephropathy 07/08/2012    Class: Chronic   Asthma, persistent controlled 07/08/2012    Past Surgical History:  Procedure Laterality Date   ADENOIDECTOMY      BIOPSY  02/01/2021   Procedure: BIOPSY;  Surgeon: Rogene Houston, MD;  Location[facility];  Service: Endoscopy;;  duodenal, esophageal,   BIOPSY  02/03/2021   Procedure: BIOPSY;  Surgeon: Eloise Harman, DO;  Location: [facility];  Service: Endoscopy;;   COLONOSCOPY WITH PROPOFOL N/A 02/01/2021   Procedure: COLONOSCOPY WITH PROPOFOL;  Surgeon: Rogene Houston, MD;  Location: [facility];  Service: Endoscopy;  Laterality: N/A;   COLONOSCOPY WITH PROPOFOL N/A 02/03/2021   Procedure: COLONOSCOPY WITH PROPOFOL;  Surgeon: Eloise Harman, DO;  Location[facility];  Service: Endoscopy;  Laterality: N/A;   ESOPHAGOGASTRODUODENOSCOPY (EGD) WITH PROPOFOL N/A 02/01/2021   Procedure: ESOPHAGOGASTRODUODENOSCOPY (EGD) WITH PROPOFOL;  Surgeon: Rogene Houston, MD;  [facility];  Service: Endoscopy;  Laterality: N/A;   HERNIA REPAIR     TEE WITHOUT CARDIOVERSION N/A 03/09/2021   Procedure: TRANSESOPHAGEAL ECHOCARDIOGRAM (TEE) WITH PROPOFOL;  Surgeon: Arnoldo Lenis, MD;  Location: [facility];  Service: Endoscopy;  Laterality: N/A;   TONSILLECTOMY       OB History   No obstetric history on file.     Family History  Problem Relation Age of Onset   Hypertension Mother    Diabetes Mother    Heart disease Mother    Crohn's disease Father    Alcohol abuse Father    Drug abuse Father    Mental illness Father    Cancer Father    Hypertension Maternal Aunt    Seizures Maternal Aunt    Depression Maternal Aunt    Asthma Maternal Uncle    Hypertension Maternal Uncle    Cancer Maternal Grandmother    Diabetes Maternal Grandmother    Mental illness Maternal Grandfather     Social History   Tobacco Use   Smoking status: Never   Smokeless tobacco: Never  Vaping Use   Vaping Use: Never used  Substance Use Topics   Alcohol use: No   Drug use: No    Home Medications Prior to Admission medications   Medication Sig Start Date End Date Taking? Authorizing Provider   acetaminophen (TYLENOL) 500 MG tablet Take by mouth. 04/21/20   [provider]  albuterol (VENTOLIN HFA) 108 (90 Base) MCG/ACT inhaler Inhale 2 puffs into the lungs every 6 (six) hours as needed for wheezing or shortness of breath. For shortness of breath 03/10/21   Roxan Hockey, MD  furosemide (LASIX) 20 MG tablet Take 60 mg by mouth daily. 04/25/20   [provider]  gabapentin (NEURONTIN) 100 MG capsule Take 1 capsule (100 mg total) by mouth 2 (two) times daily. 03/10/21   Roxan Hockey, MD  hydrALAZINE (APRESOLINE) 25 MG tablet Take 1 tablet (25 mg total) by mouth 2 (two) times daily. 03/10/21   Roxan Hockey, MD  insulin glargine (LANTUS) 100 UNIT/ML injection Inject 0.3 mLs (30 Units total) into the skin at bedtime. 08/16/21   Barton Dubois, MD  Insulin Glulisine (APIDRA [place]) Inject 10-16 Units into the skin 3 (three) times daily before meals.  ssi    [provider]  isosorbide mononitrate (IMDUR) 30 MG 24 hr tablet Take 0.5 tablets (15 mg total) by mouth daily. Patient taking differently: Take 30 mg by mouth daily. 03/19/21   Barton Dubois, MD  levofloxacin (LEVAQUIN) 750 MG tablet Take 1 tablet (750 mg total) by mouth every other day for 2 doses. 08/18/21 08/21/21  Barton Dubois, MD  metoCLOPramide (REGLAN) 5 MG tablet Take 1 tablet (5 mg total) by mouth every 8 (eight) hours as needed for refractory nausea / vomiting (refractory symptoms). 03/19/21 03/19/22  Barton Dubois, MD  metoprolol tartrate (LOPRESSOR) 50 MG tablet Take 1 tablet (50 mg total) by mouth 2 (two) times daily. 03/23/21   [name], PA-C  omeprazole (PRILOSEC) 40 MG capsule Take 1 capsule (40 mg total) by mouth in the morning and at bedtime. 08/16/21   Barton Dubois, MD  ondansetron (ZOFRAN ODT) 8 MG disintegrating tablet Take 1 tablet (8 mg total) by mouth every 8 (eight) hours as needed for nausea or vomiting. Patient not taking: No sig reported 03/10/21   Roxan Hockey, MD  potassium  chloride (KLOR-CON) 10 MEQ tablet Take 10 mEq by mouth daily. 10/21/20   [provider]    Allergies    Amoxicillin, Dexamethasone, Doxycycline, Penicillins, Ceftriaxone sodium in dextrose, Augmentin [amoxicillin-pot clavulanate], Clavulanic acid, Adhesive [tape], Latex, Other, and Pineapple  Review of Systems   Review of Systems  Constitutional:  Negative for chills and fever.  HENT:  Negative for ear pain and sore throat.   Eyes:  Negative for pain and visual disturbance.  Respiratory:  Positive for shortness of breath. Negative for cough.   Cardiovascular:  Positive for chest pain and leg swelling. Negative for palpitations.  Gastrointestinal:  Negative for abdominal pain and vomiting.  Genitourinary:  Negative for dysuria and hematuria.  Musculoskeletal:  Negative for arthralgias and back pain.  Skin:  Negative for color change and rash.  Neurological:  Negative for seizures and syncope.  All other systems reviewed and are negative.  Physical Exam Updated Vital Signs BP (!) 156/127   Pulse (!) 104   Resp 12   Ht 1.499 m (4' 11" )   Wt 70.8 kg   SpO2 100%   BMI 31.51 kg/m   Physical Exam Vitals and nursing note reviewed.  Constitutional:      General: She is not in acute distress.    Appearance: Normal appearance. She is well-developed.  HENT:     Head: Normocephalic and atraumatic.  Eyes:     Extraocular Movements: Extraocular movements intact.     Conjunctiva/sclera: Conjunctivae normal.     Pupils: Pupils are equal, round, and reactive to light.  Cardiovascular:     Rate and Rhythm: Normal rate and regular rhythm.     Heart sounds: No murmur heard. Pulmonary:     Effort: Pulmonary effort is normal. No respiratory distress.     Breath sounds: Normal breath sounds.  Chest:     Chest wall: No tenderness.  Abdominal:     Palpations: Abdomen is soft.     Tenderness: There is no abdominal tenderness.  Musculoskeletal:        General: Swelling present.      Cervical back: Neck supple.  Skin:    General: Skin is warm and dry.  Neurological:     General: No focal deficit present.     Mental Status: She is alert and oriented to person, place, and time.    ED Results / Procedures /  Treatments   Labs (all labs ordered are listed, but only abnormal results are displayed) Labs Reviewed  COMPREHENSIVE METABOLIC PANEL - Abnormal; Notable for the following components:      Result Value   Glucose, Bld 175 (*)    Creatinine, Ser 1.15 (*)    Calcium 8.1 (*)    Total Protein 6.1 (*)    Albumin 2.1 (*)    Anion gap 3 (*)    All other components within normal limits  CBC WITH DIFFERENTIAL/PLATELET - Abnormal; Notable for the following components:   RBC 3.20 (*)    Hemoglobin 9.7 (*)    HCT 30.4 (*)    Platelets 446 (*)    Eosinophils Absolute 0.7 (*)    Abs Immature Granulocytes 0.15 (*)    All other components within normal limits  CBG MONITORING, ED - Abnormal; Notable for the following components:   Glucose-Capillary 128 (*)    All other components within normal limits  TROPONIN I (HIGH SENSITIVITY)  TROPONIN I (HIGH SENSITIVITY)    EKG EKG Interpretation  Date/Time:  Thursday August 17 2021 15:44:17 EDT Ventricular Rate:  106 PR Interval:  128 QRS Duration: 78 QT Interval:  325 QTC Calculation: 432 R Axis:   83 Text Interpretation: Sinus tachycardia Borderline low voltage, extremity leads No significant change since last tracing Confirmed by Fredia Sorrow [phone]) on 08/17/2021 4:07:10 PM  Radiology CT Angio Chest PE W/Cm &/Or Wo Cm  Result Date: 08/17/2021 CLINICAL DATA:  Chest pain EXAM: CT ANGIOGRAPHY CHEST WITH CONTRAST TECHNIQUE: Multidetector CT imaging of the chest was performed using the standard protocol during bolus administration of intravenous contrast. Multiplanar CT image reconstructions and MIPs were obtained to evaluate the vascular anatomy. CONTRAST:  57m OMNIPAQUE IOHEXOL 350 MG/ML SOLN COMPARISON:   04/07/2021 FINDINGS: Cardiovascular: Contrast injection is sufficient to demonstrate satisfactory opacification of the pulmonary arteries to the segmental level. There is no pulmonary embolus or evidence of right heart strain. The size of the main pulmonary artery is normal. Heart size is normal, with no pericardial effusion. The course and caliber of the aorta are normal. There is no atherosclerotic calcification. Opacification decreased due to pulmonary arterial phase contrast bolus timing. Mediastinum/Nodes: No mediastinal, hilar or axillary lymphadenopathy. Normal visualized thyroid. Thoracic esophageal course is normal. Lungs/Pleura: Bilateral pleural effusions. Bibasilar atelectasis. Multifocal tree-in-bud opacity in the right upper lobe. Mild pulmonary edema. Upper Abdomen: Contrast bolus timing is not optimized for evaluation of the abdominal organs. The visualized portions of the organs of the upper abdomen are normal. Musculoskeletal: No chest wall abnormality. No bony spinal canal stenosis. Anasarca. Review of the MIP images confirms the above findings. IMPRESSION: 1. No pulmonary embolus or acute aortic syndrome. 2. Bilateral pleural effusions and anasarca with mild pulmonary edema. This may indicate congestive heart failure. 3. Multifocal tree-in-bud opacity in the right upper lobe may indicate endobronchial infection. Electronically Signed   By: KUlyses JarredM.D.   On: 08/17/2021 20:09   DG Chest Port 1 View  Result Date: 08/17/2021 CLINICAL DATA:  Chest pain and shortness of breath EXAM: PORTABLE CHEST 1 VIEW COMPARISON:  Chest x-ray 08/11/2021, CT chest 04/07/2021 FINDINGS: The heart and mediastinal contours are within normal limits. Right base linear atelectasis. Left base linear atelectasis. No focal consolidation. No pulmonary edema. No pleural effusion. No pneumothorax. No acute osseous abnormality. IMPRESSION: Bibasilar atelectasis with no active disease. Electronically Signed   By: MIven FinnM.D.   On: 08/17/2021 17:10    Procedures Procedures  Medications Ordered in ED Medications  iohexol (OMNIPAQUE) 350 MG/ML injection 75 mL (75 mLs Intravenous Contrast Given 08/17/21 1929)    ED Course  I have reviewed the triage vital signs and the nursing notes.  Pertinent labs & imaging results that were available during my care of the patient were reviewed by me and considered in my medical decision making (see chart for details).    MDM Rules/Calculators/A&P                           Patient just discharged from the hospital on September 14.  Work-up for the anterior chest pain troponins were normal first 1 was 16 repeat was 13.  Patient during the last hospitalization was shown to have pneumonia.  CT angio was done which showed some mild pulmonary edema.  Did not show pulmonary embolus.  Shows a little bit of infiltrate in the upper lobe.  This seems to be consistent with what she had in the hospital.  Actually seems to be improved.  No leukocytosis.  Patient is taking her antibiotics.  For patient's acute kidney injury.  Actually her renal function here today GFR is greater than 60.  Patient does have a mild anemia.  Patient also did have some hypertension here.  Indication for admission.  Patient will need close follow-up as an outpatient.  She will continue her antibiotics.  Continue doubling up on her Lasix as they requested and have her labs rechecked.  She will return for any new or worse symptoms.  No evidence of acute cardiac event no evidence of any pulmonary embolus. Final Clinical Impression(s) / ED Diagnoses Final diagnoses:  Chronic pulmonary edema  Atypical chest pain    Rx / DC Orders ED Discharge Orders     None        Fredia Sorrow, MD 08/17/21 2156

## 2021-08-17 NOTE — ED Notes (Signed)
Patient transported to CT 

## 2021-08-17 NOTE — Discharge Instructions (Addendum)
I work-up here today reassuring.  Kidney function normal.  Chest x-ray shows some concerns for pneumonia which are being treated for.  Does not appear to be worse.  Continue the antibiotics.  Make an appointment to follow-up with your primary care doctor next week.  To have things rechecked.  Continue the increase in the Lasix that they had started you on today.  Return for any new or worse breathing problems.

## 2021-08-17 NOTE — ED Notes (Signed)
  Patient SPO2 in mid to low 80s.  Pulse ox checked and giving good waveform.  Patient placed on 2L via [place].  SPO2 93%

## 2021-08-17 NOTE — ED Triage Notes (Signed)
Pt states that her PCP advised her to come to ED due to abnormal labs and rapid increase in weight overnight. Pt complains of chest pain with deep breaths.

## 2021-08-21 ENCOUNTER — Telehealth: Payer: Self-pay

## 2021-08-21 NOTE — Telephone Encounter (Signed)
Attempted to follow up with client by phone call, and also text message sent requesting response to determine how she was doing after an ER visit last week.  No response , will attempt tomorrow if no further response from client.  [employer]

## 2021-08-22 ENCOUNTER — Encounter (HOSPITAL_COMMUNITY): Payer: Medicaid Other | Admitting: Physical Therapy

## 2021-08-24 ENCOUNTER — Encounter (HOSPITAL_COMMUNITY): Payer: Medicaid Other

## 2021-09-04 ENCOUNTER — Encounter: Payer: Self-pay | Admitting: Gastroenterology

## 2021-09-04 NOTE — Progress Notes (Signed)
Referring Provider: [name], PA-C Primary Care Physician:  [name], PA-C Primary GI Physician: Dr. Abbey Chatters  Chief Complaint  Patient presents with   Abdominal Pain    Mid lower abd     HPI:   Diamond [name] is a 25 y.o. female presenting today with multiple complaints including abdominal pain, nausea, vomiting, and prior episode of rectal bleeding.   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  Also with history of chronic GERD, EOE, and questionable history of Crohn's disease. Colonoscopy in 2011 by Dr. Roanna Raider at [facility] with colonic mucosa erythematous and thick at level of cecum and presence of ulcerations.  Pathology of cecum, ascending colon, and transverse colon showed focal active colitis.  TI was not intubated.  Most recent colonoscopy with Dr. Abbey Chatters March 2022 with normal-appearing TI s/p biopsy which revealed chronic ileitis with no activity, granulomata, or malignancy.  Last seen in our office 04/11/2021.  She was on omeprazole 40 mg twice daily and continued with dysphagia to certain foods, rice/noodles/pasta.  Denies heartburn but admitted to epigastric pain frequently without postprandial component.  Also with frequent vomiting after meals or at 3 AM in the morning.  Taking Zofran and Reglan before breakfast lunch and dinner.  Diarrhea off and on, but persistent for the last week with several watery stools daily.  Denied BRBPR or melena.  She had lost about 20 pounds since February but felt this was due to edema and that she was back at her baseline weight now. Suspect that her persistent N/V likely multifactorial in the setting of poorly controlled diabetes, multiple recent  infections, gastroparesis, poss related to eosinophilic esophagitis. Regarding EOE, uncomfortable prescribing fluticasone or budesonide due to history of hives/itching to dexamethasone.  She was referred to allergist to test for food allergies.  Could pursue food elimination along with PPI therapy as first-line treatment.  She was continued on PPI twice daily.  Regarding diarrhea, plan to rule out C. difficile.  No plans to start Crohn's specific medications.  Recommended following for now.  Unfortunately Allergist did not accept patient's insurance.   Stool testing not completed.  Patient was briefly admitted 8/16-8/17 to [facility] for hyperglycemia without ketosis. She also noted lower abdominal pain with associated nausea/vomiting/diarrhea, and rectal bleeding x2 days.  Hemoglobin was 12.4.  CT A/P with contrast was completed with no acute findings.  She did have a small amount of free abdominal and free pelvic fluid of uncertain significance or etiology, diffuse body wall edema, mild bladder distention.  Patient was hospitalized in early September with diabetic ketoacidosis, community-acquired pneumonia, elevated troponin.  Echocardiogram was reassuring with plans to follow-up with cardiology outpatient.  She is also been seen in the emergency room on multiple other accounts for various reasons including nausea/vomiting, abdominal pain, gastroparesis, hyperglycemia, atypical chest pain, peripheral edema.    Today: Had acute onset bright red blood per rectum in August.  States she went to urinate, had an acute sharp pain in her rectum, and then had bright red blood "pouring out".  No bowel movement at that time.  After that, when having bowel movements, she would have some bright red blood as well.  Symptoms lasted for about 2 weeks and resolved.  No ongoing rectal pain.  Reports having soft, formed bowel  movements every other day.  Denies constipation or diarrhea.  Vomiting daily to every  other day for the past 4 weeks.  No hematemesis or coffee ground emesis. States it has been pretty constant which is different from her baseline.  Typically vomits every few hours.  Starts around 12:30 AM.  She does okay during the middle of the day sometimes with Phenergan.  Not really keeping many of her oral medications down.  Takes Zofran once a day with her morning medications.  Taking Reglan twice a day.  Sometimes 3 times a day.  Taking omeprazole 40 mg twice daily which keeps GERD symptoms fairly well controlled.  Denies typical dysphagia symptoms, but admits to burning in her esophagus at times.  Denies pain with swallowing.  Denies melena.  Eating oatmeal in the morning with egg. Grilled chicken at lunch. May eat a small meal of vegetables, jello at dinner, other times she doesn't eat dinner.   Also with significant pain across her upper abdomen as well as low mid abdomen/suprapubic area.  Symptoms initially started 4+ weeks ago, but are worsening.  Daily.  Constant unless taking something for pain.  Takes to red pills, but is not sure if this is Tylenol or ibuprofen.  States her aunt told her it was Tylenol.  Symptoms worsen with eating.  Describes it as sharp and stabbing.  Also with difficulty urinating.  States she cannot empty her bladder completely.  Has frequency.  Denies dysuria.  Also reports bilateral back/flank pain for about 1 month.  She has a questionable history of lumbar discitis in April s/p treatment with antibiotics and following with ID  Had a sinus infection. Was on antibiotics. Stopped yesterday. Feeling somewhat improved.   BP is quite elevated today at 203/131. Recheck was 174/103. Yesterday BP was <150 over 90 something. Has a bad headache. No CP. No SOB. No vision changes. Took BP medications this morning, but vomited them back up.   Denies fever, but admits to chills. Visibly shaking at this time.   Past Medical History:  Diagnosis Date   ADHD (attention deficit  hyperactivity disorder) 2007   Allergy    Arm DVT (deep venous thromboembolism), acute, right ([place]) 04/07/2021   Asthma    Bipolar disorder (HCC)    Crohn's colitis ([place])    Dx Dr. Cherrie Gauze   Diabetic nephropathy [facility])    Discitis of lumbar region 05/29/2021   Edema    Eosinophilic esophagitis    GERD (gastroesophageal reflux disease)    Hypertension    MSSA bacteremia 04/07/2021   Nephrotic syndrome    Pancreatitis    Panic attacks    PTSD (post-traumatic stress disorder)    Stroke ([place]) 07/2020   R side   Type 1 diabetes ([place]) 2008    Past Surgical History:  Procedure Laterality Date   ADENOIDECTOMY     BIOPSY  02/01/2021   Procedure: BIOPSY;  Surgeon: Rogene Houston, MD;  Location: [facility];  Service: Endoscopy;;  duodenal, esophageal,   BIOPSY  02/03/2021   Procedure: BIOPSY;  Surgeon: Eloise Harman, DO;  Location: [facility];  Service: Endoscopy;;   COLONOSCOPY WITH PROPOFOL N/A 02/01/2021   Procedure: COLONOSCOPY WITH PROPOFOL;  Surgeon: Rogene Houston, MD;  Location: [facility];  Service: Endoscopy;  Laterality: N/A;   COLONOSCOPY WITH PROPOFOL N/A 02/03/2021   Surgeon: Eloise Harman, DO; preparation of the colon was fair after copious lavage, nonbleeding internal hemorrhoids, TI appeared normal s/p  biopsy which revealed chronic ileitis with no activity, granulomata, or malignancy.   ESOPHAGOGASTRODUODENOSCOPY (EGD) WITH PROPOFOL N/A 02/01/2021   Surgeon: Rogene Houston, MD; longitudinally marked mucosal in the esophagus biopsied (chronic esophagitis with increased eosinophils compatible with EOE), normal stomach and duodenum.  Duodenum was biopsied with unremarkable pathology.   HERNIA REPAIR     TEE WITHOUT CARDIOVERSION N/A 03/09/2021   Procedure: TRANSESOPHAGEAL ECHOCARDIOGRAM (TEE) WITH PROPOFOL;  Surgeon: Arnoldo Lenis, MD;  Location: [facility];  Service: Endoscopy;  Laterality: N/A;   TONSILLECTOMY      Current Outpatient  Medications  Medication Sig Dispense Refill   albuterol (VENTOLIN HFA) 108 (90 Base) MCG/ACT inhaler Inhale 2 puffs into the lungs every 6 (six) hours as needed for wheezing or shortness of breath. For shortness of breath 3 each 0   furosemide (LASIX) 40 MG tablet Take 1 tablet (40 mg total) by mouth 2 (two) times daily. (Patient taking differently: Take 80 mg by mouth 2 (two) times daily.) 30 tablet 1   gabapentin (NEURONTIN) 100 MG capsule Take 1 capsule (100 mg total) by mouth 2 (two) times daily. 180 capsule 5   hydrALAZINE (APRESOLINE) 25 MG tablet Take 1 tablet (25 mg total) by mouth 2 (two) times daily. 90 tablet 3   insulin glargine (LANTUS) 100 UNIT/ML injection Inject 0.3 mLs (30 Units total) into the skin at bedtime. (Patient taking differently: Inject 40 Units into the skin at bedtime.)     Insulin Glulisine (APIDRA [place]) Inject 10-16 Units into the skin 3 (three) times daily before meals. ssi     isosorbide mononitrate (IMDUR) 30 MG 24 hr tablet Take 0.5 tablets (15 mg total) by mouth daily. (Patient taking differently: Take 30 mg by mouth daily.)     metoCLOPramide (REGLAN) 5 MG tablet Take 1 tablet (5 mg total) by mouth every 8 (eight) hours as needed for refractory nausea / vomiting (refractory symptoms). 90 tablet 0   metoprolol tartrate (LOPRESSOR) 50 MG tablet Take 1 tablet (50 mg total) by mouth 2 (two) times daily. 180 tablet 0   omeprazole (PRILOSEC) 40 MG capsule Take 1 capsule (40 mg total) by mouth in the morning and at bedtime. 60 capsule 1   ondansetron (ZOFRAN ODT) 8 MG disintegrating tablet Take 1 tablet (8 mg total) by mouth every 8 (eight) hours as needed for nausea or vomiting. 20 tablet 2   acetaminophen (TYLENOL) 500 MG tablet Take by mouth. (Patient not taking: No sig reported)     No current facility-administered medications for this visit.    Allergies as of 09/06/2021 - Review Complete 09/06/2021  Allergen Reaction Noted   Amoxicillin Hives 04/28/2012    Dexamethasone Hives and Itching 05/04/2020   Doxycycline Itching and Swelling 04/17/2020   Penicillins Hives and Itching 04/28/2012   Ceftriaxone sodium in dextrose Itching 02/29/2020   Augmentin [amoxicillin-pot clavulanate] Other (See Comments) 07/12/2012   Clavulanic acid  05/29/2019   Adhesive [tape] Itching and Rash 07/12/2012   Latex Itching and Rash 05/15/2012   Other Itching and Rash 07/12/2012   Pineapple Rash 06/26/2012    Family History  Problem Relation Age of Onset   Hypertension Mother    Diabetes Mother    Heart disease Mother    Crohn's disease Father    Alcohol abuse Father    Drug abuse Father    Mental illness Father    Cancer Father    Hypertension Maternal Aunt    Seizures Maternal Aunt  Depression Maternal Aunt    Asthma Maternal Uncle    Hypertension Maternal Uncle    Cancer Maternal Grandmother    Diabetes Maternal Grandmother    Mental illness Maternal Grandfather     Social History   Socioeconomic History   Marital status: Single    Spouse name: Not on file   Number of children: Not on file   Years of education: Not on file   Highest education level: Not on file  Occupational History   Not on file  Tobacco Use   Smoking status: Never   Smokeless tobacco: Never  Vaping Use   Vaping Use: Never used  Substance and Sexual Activity   Alcohol use: No   Drug use: No   Sexual activity: Not Currently  Other Topics Concern   Not on file  Social History Narrative   Not on file   Social Determinants of Health   Financial Resource Strain: Not on file  Food Insecurity: Not on file  Transportation Needs: Not on file  Physical Activity: Not on file  Stress: Not on file  Social Connections: Not on file    Review of Systems: Gen: See HPI.  CV: Denies chest pain. Admits to feeling her heart beating fast.  Resp: Denies dyspnea or cough.  GI: See HPI GU: See HPI Derm: Denies rash Psych: Denies depression, anxiety Heme: Denies bruising,  bleeding.  Physical Exam: BP (!) 174/104   Pulse (!) 112   Temp (!) 97.1 F (36.2 C) (Temporal)   Ht 4' 11"  (1.499 m)   Wt 140 lb 9.6 oz (63.8 kg)   BMI 28.40 kg/m  General:   Alert and oriented. Appears uncomfortable, holding her abdomen, vomiting prior to exam, visibly shaking from chills.   Head:  Normocephalic and atraumatic. Eyes:  Conjuctiva clear without scleral icterus. Heart:  S1, S2 present without murmurs appreciated. Tachycardic.  Lungs:  Clear to auscultation bilaterally. No wheezes, rales, or rhonchi. No distress.  Abdomen:  +BS. Mild abdominal distension/fullness but remains soft.  Moderate TTP across upper abdomen and moderate suprapubic TTP. Mild TTP in RLQ and LLQ. No rebound or guarding. No HSM or masses noted. Back: Bilateral flank pain with CVA tenderness.  Msk:  Symmetrical without gross deformities. Normal posture. Extremities:  2-3+ pitting edema below the knees Neurologic:  Alert and  oriented x4 Psych:  Normal mood and affect.    Assessment: 25 year old female with complicated medical history including uncontrolled T1DM, gastroparesis, pancreatitis, previous stroke, peripheral neuropathy, CKD with nephrotic range proteinuria, anasarca, GERD, EOE, and questionable history of Crohn's disease/indeterminate colitis in 2011 presenting today with multiple complains including abdominal pain, back pain, nausea, vomiting, difficulty urinating, prior episode of rectal bleeding, and also with significantly elevated blood pressure today.  Patient reports chronic but worsening nausea/vomiting over the last 4 weeks (on Reglan BID-TID, Zofran daily, and phenergan PRN). Now with N/V daily to every other day with trouble keeping p.o. medications down.  Also with worsening upper abdominal pain across entire upper abdomen worsened by meals, suprapubic/mid low abdominal pain, trouble urinating, and bilateral flank pain for the last 4+ weeks. Typical GERD symptoms are fairly well  controlled with omeprazole 40 mg BID. Some intermittent esophageal burning in the setting of frequent vomiting. Denies dysphagia or melena. Episode of bright red blood per rectum in August discussed below which I do not think is related to her current symptoms. On exam, BP was elevated at 203/131, recheck was 174/103 with reported "bad headache", tachycardic at  112, afebrile. She appears uncomfortable, holding her abdomen, vomiting prior to exam, visibly shaking from chills. +BS. Mild abdominal distension/fullness but remains soft.  Moderate tenderness to light palpation across upper abdomen and moderate suprapubic TTP. Mild ttp in RLQ and LLQ. Bilateral flank pain. Notably, her last CT A/P with contrast in September with suspected focal nephritis involving lower pole of right kidney, markedly distended and somewhat thickened urinary bladder, recommended excluding underlying mass of right kidney, esophagitis, gastritis, focal area of hyperenhancement and right hemiliver possibly related to vascular phenomenon given focal appearance with a lesion not excluded, recommended a follow-up ultrasound.  Differential is quite broad and likely multifactorial: Gastroparesis, uncontrolled diabetes, GERD, gastritis/esophagitis possibly worsened by  ?NSAID use as patient reports taking 2 red pills daily (?ibuprofen), and concern for acute process including pancreatitis, DKA/hyperglycemia, pyelonephritis in the setting of bilateral flank pain, UTI/bladder outlet obstruction may be contributing to suprapubic pain. Notably, patient also has questionable history of lumbar discitis in April s/p treatment with antibiotics.   Due to HTN, tachycardia, HA, intractable N/V, likely dehydration, inability to keep medications down, I do not feel outpatient evaluation is appropriate at this time. have recommended she [facility]. She is agreeable. I would recommended STAT CT A/P with contrast, CBC, CMP, Lipase, UA, Urine  Culture, consider bladder scan after voiding to evaluate for urinary retention.   Following discharge, I have recommended increasing Zofran to 3 times daily, Reglan 3 times daily, continue Phenergan as needed, reinforced gastroparesis diet, and counseled on NSAID avoidance.  Rectal bleeding:  2 weeks of bright red blood per rectum in August.  Symptoms started following acute onset rectal pain without a bowel movement.  Thereafter, she was having bright red blood with bowel movements.  After 2 weeks, rectal bleeding and rectal pain stopped and have not returned.  She denies constipation or diarrhea.  Hemoglobin remains within normal limits.  CT at the time of rectal bleeding at [facility] with no acute findings. She has known history of hemorrhoids which I suspect are likely the etiology of her bleeding.  Recent colonoscopy on file March 2022 with nonbleeding internal hemorrhoids, TI appeared normal, biopsies with chronic ileitis with no activity, granulomata, or malignancy.  At this point, we will continue to monitor.  Could consider repeat colonoscopy if symptoms return.  EOE:  Noted on EGD in March 2022.  She was on PPI once daily which was increased to twice daily thereafter.  Denies dysphagia, but admits to intermittent esophageal burning in the setting of frequent vomiting.  We attempted referral to an allergist to test for food allergies, but patient's insurance does not allow for this.  Ideally, patient needs repeat EGD to verify improvement of EOE on high-dose PPI; however, in light of frequent vomiting, I suspect eosinophil count may still be high.  Hopefully, we will gain better control of N/V and can proceed with EGD in the future.   Plan:  Proceed to [facility].  Would recommend STAT CT A/P with contrast, CBC, CMP, Lipase, UA, Urine Culture, consider bladder scan after voiding to evaluate for urinary retention.   No NSAIDs.  Increase Zofran to TID before meals Increase Reglan  to TID before meals Continue Phenergan PRN.  Continue omeprazole 40 mg BID.  Reinforced gastroparesis diet/lifestyle. Repeat EGD in the future,ideally once N/V improves.  Monitor for return of rectal bleeding.  I will follow-up on ED evaluation and arrange follow-up thereafter.    Aliene Altes, PA-C [facility]  09/06/2021  

## 2021-09-06 ENCOUNTER — Other Ambulatory Visit: Payer: Self-pay

## 2021-09-06 ENCOUNTER — Ambulatory Visit (INDEPENDENT_AMBULATORY_CARE_PROVIDER_SITE_OTHER): Payer: Self-pay | Admitting: Gastroenterology

## 2021-09-06 ENCOUNTER — Encounter: Payer: Self-pay | Admitting: Internal Medicine

## 2021-09-06 ENCOUNTER — Encounter: Payer: Self-pay | Admitting: Gastroenterology

## 2021-09-06 ENCOUNTER — Emergency Department (HOSPITAL_COMMUNITY)
Admission: EM | Admit: 2021-09-06 | Discharge: 2021-09-06 | Disposition: A | Discharge status: Home / Self Care | Payer: Self-pay | Attending: Emergency Medicine | Admitting: Emergency Medicine

## 2021-09-06 ENCOUNTER — Encounter (HOSPITAL_COMMUNITY): Payer: Self-pay

## 2021-09-06 ENCOUNTER — Emergency Department (HOSPITAL_COMMUNITY): Payer: Self-pay

## 2021-09-06 VITALS — BP 174/104 | HR 112 | Temp 97.1°F | Ht 59.0 in | Wt 140.6 lb

## 2021-09-06 DIAGNOSIS — Z79899 Other long term (current) drug therapy: Secondary | ICD-10-CM | POA: Insufficient documentation

## 2021-09-06 DIAGNOSIS — R7989 Other specified abnormal findings of blood chemistry: Secondary | ICD-10-CM | POA: Insufficient documentation

## 2021-09-06 DIAGNOSIS — R109 Unspecified abdominal pain: Secondary | ICD-10-CM | POA: Insufficient documentation

## 2021-09-06 DIAGNOSIS — R112 Nausea with vomiting, unspecified: Secondary | ICD-10-CM

## 2021-09-06 DIAGNOSIS — R609 Edema, unspecified: Secondary | ICD-10-CM | POA: Insufficient documentation

## 2021-09-06 DIAGNOSIS — R1024 Suprapubic pain: Secondary | ICD-10-CM

## 2021-09-06 DIAGNOSIS — R000 Tachycardia, unspecified: Secondary | ICD-10-CM | POA: Insufficient documentation

## 2021-09-06 DIAGNOSIS — R1010 Upper abdominal pain, unspecified: Secondary | ICD-10-CM

## 2021-09-06 DIAGNOSIS — R102 Pelvic and perineal pain: Secondary | ICD-10-CM

## 2021-09-06 DIAGNOSIS — R809 Proteinuria, unspecified: Secondary | ICD-10-CM | POA: Insufficient documentation

## 2021-09-06 DIAGNOSIS — R519 Headache, unspecified: Secondary | ICD-10-CM | POA: Insufficient documentation

## 2021-09-06 DIAGNOSIS — J45909 Unspecified asthma, uncomplicated: Secondary | ICD-10-CM | POA: Insufficient documentation

## 2021-09-06 DIAGNOSIS — Z794 Long term (current) use of insulin: Secondary | ICD-10-CM | POA: Insufficient documentation

## 2021-09-06 DIAGNOSIS — I10 Essential (primary) hypertension: Secondary | ICD-10-CM | POA: Insufficient documentation

## 2021-09-06 DIAGNOSIS — K625 Hemorrhage of anus and rectum: Secondary | ICD-10-CM

## 2021-09-06 DIAGNOSIS — K200 Eosinophilic esophagitis: Secondary | ICD-10-CM

## 2021-09-06 DIAGNOSIS — K219 Gastro-esophageal reflux disease without esophagitis: Secondary | ICD-10-CM

## 2021-09-06 DIAGNOSIS — R300 Dysuria: Secondary | ICD-10-CM | POA: Insufficient documentation

## 2021-09-06 DIAGNOSIS — E1140 Type 2 diabetes mellitus with diabetic neuropathy, unspecified: Secondary | ICD-10-CM | POA: Insufficient documentation

## 2021-09-06 DIAGNOSIS — R10A0 Flank pain, unspecified side: Secondary | ICD-10-CM

## 2021-09-06 DIAGNOSIS — R1310 Dysphagia, unspecified: Secondary | ICD-10-CM | POA: Insufficient documentation

## 2021-09-06 DIAGNOSIS — Z7984 Long term (current) use of oral hypoglycemic drugs: Secondary | ICD-10-CM | POA: Insufficient documentation

## 2021-09-06 LAB — CBC
HCT: 36.8 % (ref 36.0–46.0)
Hemoglobin: 12.1 g/dL (ref 12.0–15.0)
MCH: 30.3 pg (ref 26.0–34.0)
MCHC: 32.9 g/dL (ref 30.0–36.0)
MCV: 92 fL (ref 80.0–100.0)
Platelets: 353 10*3/uL (ref 150–400)
RBC: 4 MIL/uL (ref 3.87–5.11)
RDW: 12.5 % (ref 11.5–15.5)
WBC: 7 10*3/uL (ref 4.0–10.5)
nRBC: 0 % (ref 0.0–0.2)

## 2021-09-06 LAB — DIFFERENTIAL
Abs Immature Granulocytes: 0.01 10*3/uL (ref 0.00–0.07)
Basophils Absolute: 0.1 10*3/uL (ref 0.0–0.1)
Basophils Relative: 1 %
Eosinophils Absolute: 0.6 10*3/uL — ABNORMAL HIGH (ref 0.0–0.5)
Eosinophils Relative: 8 %
Immature Granulocytes: 0 %
Lymphocytes Relative: 28 %
Lymphs Abs: 2 10*3/uL (ref 0.7–4.0)
Monocytes Absolute: 0.5 10*3/uL (ref 0.1–1.0)
Monocytes Relative: 7 %
Neutro Abs: 3.9 10*3/uL (ref 1.7–7.7)
Neutrophils Relative %: 56 %

## 2021-09-06 LAB — COMPREHENSIVE METABOLIC PANEL
ALT: 15 U/L (ref 0–44)
AST: 16 U/L (ref 15–41)
Albumin: 2.8 g/dL — ABNORMAL LOW (ref 3.5–5.0)
Alkaline Phosphatase: 116 U/L (ref 38–126)
Anion gap: 8 (ref 5–15)
BUN: 18 mg/dL (ref 6–20)
CO2: 26 mmol/L (ref 22–32)
Calcium: 8.7 mg/dL — ABNORMAL LOW (ref 8.9–10.3)
Chloride: 100 mmol/L (ref 98–111)
Creatinine, Ser: 1.3 mg/dL — ABNORMAL HIGH (ref 0.44–1.00)
GFR, Estimated: 59 mL/min — ABNORMAL LOW (ref >60)
Glucose, Bld: 536 mg/dL (ref 70–99)
Potassium: 3.9 mmol/L (ref 3.5–5.1)
Sodium: 134 mmol/L — ABNORMAL LOW (ref 135–145)
Total Bilirubin: 1 mg/dL (ref 0.3–1.2)
Total Protein: 7.5 g/dL (ref 6.5–8.1)

## 2021-09-06 LAB — URINALYSIS, ROUTINE W REFLEX MICROSCOPIC
Bilirubin Urine: NEGATIVE
Glucose, UA: >=500 mg/dL — AB
Ketones, ur: NEGATIVE mg/dL
Leukocytes,Ua: NEGATIVE
Nitrite: NEGATIVE
Protein, ur: >=300 mg/dL — AB
Specific Gravity, Urine: 1.009 (ref 1.005–1.030)
pH: 7 (ref 5.0–8.0)

## 2021-09-06 LAB — CBG MONITORING, ED
Glucose-Capillary: 475 mg/dL — ABNORMAL HIGH (ref 70–99)
Glucose-Capillary: 339 mg/dL — ABNORMAL HIGH (ref 70–99)

## 2021-09-06 LAB — RAPID URINE DRUG SCREEN, HOSP PERFORMED
Amphetamines: NOT DETECTED
Barbiturates: NOT DETECTED
Benzodiazepines: NOT DETECTED
Cocaine: NOT DETECTED
Opiates: NOT DETECTED
Tetrahydrocannabinol: NOT DETECTED

## 2021-09-06 LAB — LIPASE, BLOOD: Lipase: 31 U/L (ref 11–51)

## 2021-09-06 LAB — PREGNANCY, URINE: Preg Test, Ur: NEGATIVE

## 2021-09-06 MED ORDER — HYDRALAZINE HCL 25 MG PO TABS
25.0000 mg | ORAL_TABLET | Freq: Once | ORAL | Status: AC
  Administered 2021-09-06: 25 mg via ORAL
  Filled 2021-09-06: qty 1

## 2021-09-06 MED ORDER — ISOSORBIDE MONONITRATE ER 30 MG PO TB24
15.0000 mg | ORAL_TABLET | Freq: Once | ORAL | Status: AC
  Administered 2021-09-06: 15 mg via ORAL
  Filled 2021-09-06: qty 1

## 2021-09-06 MED ORDER — LACTATED RINGERS IV BOLUS
1000.0000 mL | Freq: Once | INTRAVENOUS | Status: AC
  Administered 2021-09-06: 1000 mL via INTRAVENOUS

## 2021-09-06 MED ORDER — SODIUM CHLORIDE 0.9 % IV BOLUS
1000.0000 mL | Freq: Once | INTRAVENOUS | Status: DC
  Administered 2021-09-06: 1000 mL via INTRAVENOUS

## 2021-09-06 MED ORDER — INSULIN ASPART 100 UNIT/ML IJ SOLN
14.0000 [IU] | Freq: Once | INTRAMUSCULAR | Status: AC
  Administered 2021-09-06: 14 [IU] via SUBCUTANEOUS
  Filled 2021-09-06: qty 1

## 2021-09-06 MED ORDER — LABETALOL HCL 5 MG/ML IV SOLN
20.0000 mg | Freq: Once | INTRAVENOUS | Status: AC
  Administered 2021-09-06: 20 mg via INTRAVENOUS
  Filled 2021-09-06: qty 4

## 2021-09-06 NOTE — ED Provider Notes (Signed)
[facility] EMERGENCY DEPARTMENT Provider Note   CSN: [id number] Arrival date & time: 09/06/21  1138     History Chief Complaint  Patient presents with   Hypertension    MICHALLA RINGER is a 25 y.o. female.  HPI  Patient with numerous medical problems listed below who presents from gastroenterology office visit to the ED with concerns about her high blood pressure, headache, dysuria.  Patient reports that since her discharge from the ED about a month ago she has been having multiple episodes of emesis every day.  She is unable to keep down any solid food but is able to swallow fluids.  Due to this she is not taking any of her oral medicine and is vomiting the mall up per the patient.  For the last 2 weeks she has been having increasing urinary urgency and difficulty urinating.  When she urinates there is some dysuria.  Denies any hematuria.  Headache has been every day since her discharge, it is worsened by light and associated with nausea and vomiting.  There is no associated vision changes.  Patient is an insulin-dependent diabetic, reports has not been using insulin because she has not been eating solid food.   Past Medical History:  Diagnosis Date   ADHD (attention deficit hyperactivity disorder) 2007   Allergy    Arm DVT (deep venous thromboembolism), acute, right ([place]) 04/07/2021   Asthma    Bipolar disorder ([place])    Crohn's colitis ([place])    Dx Dr. Cherrie Gauze   Diabetic nephropathy [facility])    Discitis of lumbar region 05/29/2021   Edema    Eosinophilic esophagitis    GERD (gastroesophageal reflux disease)    Hypertension    MSSA bacteremia 04/07/2021   Nephrotic syndrome    Pancreatitis    Panic attacks    PTSD (post-traumatic stress disorder)    Stroke ([place]) 07/2020   R side   Type 1 diabetes ([place]) 2008    Patient Active Problem List   Diagnosis Date Noted   Rectal bleeding 09/06/2021   Suprapubic pain 09/06/2021   Odynophagia 09/06/2021   Atypical chest  pain    Elevated troponin    Diabetic ketoacidosis without coma associated with type 1 diabetes mellitus ([place])    DKA, type 1 ([place]) 08/11/2021   Discitis of lumbar region 05/29/2021   Diarrhea 04/11/2021   Pain of upper abdomen 07/37/1062   Eosinophilic esophagitis 69/48/5462   MSSA bacteremia 04/07/2021   Arm DVT (deep venous thromboembolism), acute, right ([place]) 04/07/2021   AKI (acute kidney injury) ([place]) 04/01/2021   Pyelonephritis 03/31/2021   Severe low back pain    Intractable nausea and vomiting 03/16/2021   Nausea and vomiting    DM1 with Diabetic nephropathy with proteinuria and Nephrotic Syndrome- 03/10/2021    Class: Chronic   Community acquired pneumonia 03/05/2021   Type 2 or unspecified type diabetes mellitus 02/23/2021   Bladder outlet obstruction    Constipation    Abdominal pain, epigastric    Abnormal computed tomography of cecum and terminal ileum    Hyperosmolar hyperglycemic state (HHS) ([place]) 01/30/2021   Crohn's disease ([place]) 12/08/2020   Diabetic gastroparesis ([place]) 12/08/2020   Hypertension 11/01/2020   Edema 10/19/2020   Anasarca 10/18/2020   Hypoalbuminemia-due to proteinuria/nephrotic syndrome 10/18/2020   Personal history of noncompliance with medical treatment, presenting hazards to health 10/12/2020   Bilateral lower extremity edema 08/25/2020   Pneumonia 08/25/2020   Ileus ([place]) 08/06/2020   Methicillin susceptible Staphylococcus aureus  infection, unspecified site 07/08/2020   Cellulitis and abscess of buttock 06/20/2020   Skin abscess 04/24/2020   Mild intermittent asthma without complication 44/97/5300   Sepsis due to skin infection ([place]) 04/17/2020   Type 1 diabetes mellitus, uncontrolled 10/01/2013   Eczema 10/23/2012   Flank pain 08/27/2012   Gastroesophageal reflux disease 07/25/2012   Mood disorder ([place]) 07/09/2012   Contraception management 07/09/2012   History of Crohn's disease 07/09/2012   Uncontrolled type 1 diabetes mellitus  with hyperglycemia and Diabetic Nephropathy 07/08/2012    Class: Chronic   Asthma, persistent controlled 07/08/2012    Past Surgical History:  Procedure Laterality Date   ADENOIDECTOMY     BIOPSY  02/01/2021   Procedure: BIOPSY;  Surgeon: Rogene Houston, MD;  Location: [facility];  Service: Endoscopy;;  duodenal, esophageal,   BIOPSY  02/03/2021   Procedure: BIOPSY;  Surgeon: Eloise Harman, DO;  Location: [facility] END[facility];  Service: Endoscopy;;   COLONOSCOPY WITH PROPOFOL N/A 02/01/2021   Procedure: COLONOSCOPY WITH PROPOFOL;  Surgeon: Rogene Houston, MD;  Location: [facility] END[facility];  Service: Endoscopy;  Laterality: N/A;   COLONOSCOPY WITH PROPOFOL N/A 02/03/2021   Surgeon: Eloise Harman, DO; preparation of the colon was fair after copious lavage, nonbleeding internal hemorrhoids, TI appeared normal s/p biopsy which revealed chronic ileitis with no activity, granulomata, or malignancy.   ESOPHAGOGASTRODUODENOSCOPY (EGD) WITH PROPOFOL N/A 02/01/2021   Surgeon: Rogene Houston, MD; longitudinally marked mucosal in the esophagus biopsied (chronic esophagitis with increased eosinophils compatible with EOE), normal stomach and duodenum.  Duodenum was biopsied with unremarkable pathology.   HERNIA REPAIR     TEE WITHOUT CARDIOVERSION N/A 03/09/2021   Procedure: TRANSESOPHAGEAL ECHOCARDIOGRAM (TEE) WITH PROPOFOL;  Surgeon: Arnoldo Lenis, MD;  Location: [facility];  Service: Endoscopy;  Laterality: N/A;   TONSILLECTOMY       OB History   No obstetric history on file.     Family History  Problem Relation Age of Onset   Hypertension Mother    Diabetes Mother    Heart disease Mother    Crohn's disease Father    Alcohol abuse Father    Drug abuse Father    Mental illness Father    Cancer Father    Hypertension Maternal Aunt    Seizures Maternal Aunt    Depression Maternal Aunt    Asthma Maternal Uncle    Hypertension Maternal Uncle    Cancer Maternal Grandmother     Diabetes Maternal Grandmother    Mental illness Maternal Grandfather     Social History   Tobacco Use   Smoking status: Never   Smokeless tobacco: Never  Vaping Use   Vaping Use: Never used  Substance Use Topics   Alcohol use: No   Drug use: No    Home Medications Prior to Admission medications   Medication Sig Start Date End Date Taking? Authorizing Provider  acetaminophen (TYLENOL) 500 MG tablet Take by mouth. Patient not taking: No sig reported 04/21/20   [provider]  albuterol (VENTOLIN HFA) 108 (90 Base) MCG/ACT inhaler Inhale 2 puffs into the lungs every 6 (six) hours as needed for wheezing or shortness of breath. For shortness of breath 03/10/21   Roxan Hockey, MD  furosemide (LASIX) 40 MG tablet Take 1 tablet (40 mg total) by mouth 2 (two) times daily. Patient taking differently: Take 80 mg by mouth 2 (two) times daily. 08/17/21   Fredia Sorrow, MD  gabapentin (NEURONTIN) 100 MG  capsule Take 1 capsule (100 mg total) by mouth 2 (two) times daily. 03/10/21   Roxan Hockey, MD  hydrALAZINE (APRESOLINE) 25 MG tablet Take 1 tablet (25 mg total) by mouth 2 (two) times daily. 03/10/21   Roxan Hockey, MD  insulin glargine (LANTUS) 100 UNIT/ML injection Inject 0.3 mLs (30 Units total) into the skin at bedtime. Patient taking differently: Inject 40 Units into the skin at bedtime. 08/16/21   Barton Dubois, MD  Insulin Glulisine (APIDRA [place]) Inject 10-16 Units into the skin 3 (three) times daily before meals. ssi    [provider]  isosorbide mononitrate (IMDUR) 30 MG 24 hr tablet Take 0.5 tablets (15 mg total) by mouth daily. Patient taking differently: Take 30 mg by mouth daily. 03/19/21   Barton Dubois, MD  metoCLOPramide (REGLAN) 5 MG tablet Take 1 tablet (5 mg total) by mouth every 8 (eight) hours as needed for refractory nausea / vomiting (refractory symptoms). 03/19/21 03/19/22  Barton Dubois, MD  metoprolol tartrate (LOPRESSOR) 50 MG tablet Take 1 tablet  (50 mg total) by mouth 2 (two) times daily. 03/23/21   [name], PA-C  omeprazole (PRILOSEC) 40 MG capsule Take 1 capsule (40 mg total) by mouth in the morning and at bedtime. 08/16/21   Barton Dubois, MD  ondansetron (ZOFRAN ODT) 8 MG disintegrating tablet Take 1 tablet (8 mg total) by mouth every 8 (eight) hours as needed for nausea or vomiting. 03/10/21   Roxan Hockey, MD    Allergies    Amoxicillin, Dexamethasone, Doxycycline, Penicillins, Ceftriaxone sodium in dextrose, Augmentin [amoxicillin-pot clavulanate], Clavulanic acid, Adhesive [tape], Latex, Other, and Pineapple  Review of Systems   Review of Systems  Constitutional:  Positive for appetite change. Negative for fever.  Eyes:  Negative for visual disturbance.  Cardiovascular:  Positive for leg swelling. Negative for chest pain.  Gastrointestinal:  Positive for abdominal pain, blood in stool, nausea and vomiting.  Genitourinary:  Positive for difficulty urinating, dysuria, flank pain and hematuria. Negative for urgency.  Musculoskeletal:  Positive for arthralgias and myalgias.  Skin:  Negative for wound.  Neurological:  Positive for headaches. Negative for dizziness, syncope, weakness and light-headedness.   Physical Exam Updated Vital Signs BP (!) 167/130 (BP Location: Right Arm)   Pulse (!) 106   Temp 98.9 F (37.2 C)   Resp 18   Ht 4' 11"  (1.499 m)   Wt 63.5 kg   SpO2 97%   BMI 28.28 kg/m   Physical Exam Vitals and nursing note reviewed. Exam conducted with a chaperone present.  Constitutional:      Appearance: Normal appearance.  HENT:     Head: Normocephalic and atraumatic.  Eyes:     General: No scleral icterus.       Right eye: No discharge.        Left eye: No discharge.     Extraocular Movements: Extraocular movements intact.     Pupils: Pupils are equal, round, and reactive to light.  Cardiovascular:     Rate and Rhythm: Regular rhythm. Tachycardia present.     Pulses: Normal pulses.      Heart sounds: Normal heart sounds. No murmur heard.   No friction rub. No gallop.     Comments: Tachycardic, no murmurs rubs or gallops.  Radial pulses equal bilaterally and 2+. Pulmonary:     Effort: Pulmonary effort is normal. No respiratory distress.     Breath sounds: Normal breath sounds.     Comments: Lungs are clear to auscultation bilaterally,  no tachypnea or or accessory muscle use on exam.  Satting in 100% on room air. Abdominal:     General: Abdomen is flat. Bowel sounds are normal. There is no distension.     Palpations: Abdomen is soft.     Tenderness: There is abdominal tenderness. There is no right CVA tenderness or left CVA tenderness.     Comments: Abdomen is tender generally, no localized tenderness.  No rigidity or guarding.  Musculoskeletal:     Right lower leg: Edema present.     Left lower leg: Edema present.     Comments: Patient with 3+ pitting edema bilaterally, negative Homans' sign, legs are both roughly symmetric.  Skin:    General: Skin is warm and dry.     Coloration: Skin is not jaundiced.  Neurological:     Mental Status: She is alert. Mental status is at baseline.     Coordination: Coordination normal.     Comments: Cranial nerves III through XII are grossly intact.  Grip strength is equal bilaterally.  Patient is able to raise both legs.    ED Results / Procedures / Treatments   Labs (all labs ordered are listed, but only abnormal results are displayed) Labs Reviewed  LIPASE, BLOOD  COMPREHENSIVE METABOLIC PANEL  CBC  URINALYSIS, ROUTINE W REFLEX MICROSCOPIC  DIFFERENTIAL  D-DIMER, QUANTITATIVE  POC URINE PREG, ED  CBG MONITORING, ED    EKG None  Radiology No results found.  Procedures Procedures   Medications Ordered in ED Medications - No data to display  ED Course  I have reviewed the triage vital signs and the nursing notes.  Pertinent labs & imaging results that were available during my care of the patient were reviewed by  me and considered in my medical decision making (see chart for details).  Clinical Course as of 09/06/21 1750  Wed Sep 06, 2021  1740 Comprehensive metabolic panel(!!) Hyperglycemia without evidence of DKA, no anion gap.  Patient does have elevated creatinine, but roughly at baseline.  It has doubled in the last 3 weeks however, suspect this is likely secondary to medical noncompliance.  She has been given fluids. [HS]  1740 Urinalysis, Routine w reflex microscopic(!) Proteinuria and glucosuria, no leukocytosis or elevation nitrites noted be consistent with UTI. [HS]  1741 CT Head Wo Contrast No evidence of SAH or acute process. [HS]  1741 Lipase, blood Doubt pancreatitis [HS]  1741 CBC No anemia, no leukocytosis. [HS]    Clinical Course User Index [HS] Sherrill Raring, PA-C   MDM Rules/Calculators/A&P                           Patient is well-known to the department, presents with multiple complaints.  She was sent to the ER from GI evaluation due to concerns for dysuria as well as hypertension.  Patient does have elevated creatinine which could be consistent with impaired renal function, she also has proteinuria in the urine.  She has not been taking her blood pressure medicine secondary to vomiting, I suspect is likely the cause.  No LVH on EKG, headache has been constant and not consistent with an SAH.  Also no focal deficits on exam, we will still proceed with CT head to evaluate for [facility].  This resulted negative.  Patient does have bilateral edema, this is due to not being able to take her Lasix secondary to vomiting.  CMP without any signs of DKA or electrolyte derangement, she does  have hyperglycemia consistent with medical noncompliance in her diabetic state.  Will give insulin and fluid resuscitate despite the edema.  Patient has been tachycardic during exam, but no hypoxia or tachypnea.  She has had recent (08/17/21) CTA performed without any evidence of PE.  Suspect the tachycardia  secondary to discomfort, also no chest pain so do not think her troponin would be particularly beneficial.  Patient abdomen is soft, she has had multiple CT scans and was admitted to the hospital about a month ago.  She was seen by GI earlier today, the pain is been going on since her hospitalization the CT scan (08/12/21) without any new findings within the dysuria.  Do not think an additional CT abdomen would be beneficial at this point.  We will continue to perform serial abdominal exams.  UA negative for dysuria, doubt UTI or pyelonephritis.   Patient blood pressure is significantly improved secondary to administration of her blood pressure medicine and fluid resuscitation.  She does have evidence that could be consistent with hypertensive urgency such as elevated creatinine, proteinuria, frequent headaches.  Patient passed p.o. challenge.  She reports feeling improved.  Her sugar has reduced with administration of fluids and insulin.  Again, no evidence of DKA.  Blood pressure is also improved with administration of medicine.  Serial abdominal exams have been reassuring, abdomen still soft without any tenderness or guarding.  No CVA tenderness.  Patient has not had any episodes of emesis since initial intake.  She passed p.o. challenge.  I discussed results with the patient as well as concern for possible hypertensive urgency and the consequences.  Patient reports that she would prefer to continue taking her medicine to follow-up outpatient, states she does not want to be admitted for observation or additional work-up.  We engaged in shared decision making involving discussion of the risks as well as strict return precautions.  Patient verbalized understanding.  She is discharged in stable condition.  Discussed HPI, physical exam and plan of care for this patient with attending Lajean Saver. The attending physician evaluated this patient as part of a shared visit and agrees with plan of care.     Final Clinical Impression(s) / ED Diagnoses Final diagnoses:  None    Rx / DC Orders ED Discharge Orders     None        Sherrill Raring, PA-C 09/06/21 1754    Lajean Saver, MD 09/07/21 [phone]

## 2021-09-06 NOTE — Progress Notes (Signed)
Inpatient Diabetes Program Recommendations  AACE/ADA: New Consensus Statement on Inpatient Glycemic Control (2015)  Target Ranges:  Prepandial:   less than 140 mg/dL      Peak postprandial:   less than 180 mg/dL (1-2 hours)      Critically ill patients:  140 - 180 mg/dL   Lab Results  Component Value Date   GLUCAP 475 (H) 09/06/2021   HGBA1C 13.5 (H) 08/11/2021    Review of Glycemic Control Results for Diamond [name], Diamond [name] (MRN [id number]) as of 09/06/2021 15:33  Ref. Range 09/06/2021 13:12  Glucose-Capillary Latest Ref Range: 70 - 99 mg/dL 475 (H)   Diabetes history:  DM 1 Outpatient Diabetes medications:  Lantus 30 units q HS, Apidra 5-20 units tid with meals Current orders for Inpatient glycemic control:  None  Inpatient Diabetes Program Recommendations:    Patient received Novolog 14 units x1.  Needs basal insulin is she is going to stay in the hospital since she has Type 1 DM.  Consider adding Lantus 15 units daily and Novolog very sensitive (0-6 units) q 4 hours.    Thanks,  Adah Perl, RN, BC-ADM Inpatient Diabetes Coordinator Pager [phone]  (8a-5p)

## 2021-09-06 NOTE — Patient Instructions (Addendum)
Please go to [facility] for evaluation of your abdominal pain, back pain, nausea, vomiting, trouble urinating, elevated blood pressure, and headache.   I will keep an eye on your evaluation and we will follow-up with you thereafter.  Increase Zofran to 8 mg 3 times daily before meals.  Increase Reglan to 5 mg 3 times daily before meals.  You may continue to use Phenergan as needed.  Follow a gastroparesis diet including 4-6 small meals daily and a low-fat/low fiber diet. See separate handout.  Monitor for return of rectal bleeding and let us know if this occurs.  We will arrange a follow-up after you are evaluated in the emergency room.   Aliene Altes, PA-C [facility]

## 2021-09-06 NOTE — ED Triage Notes (Signed)
States she was at there GI doc that referred her here for a "kidney work-up". BP systolic >692 in office with HA and nausea. States she is completely emptying bladder when urinating.  NAD.

## 2021-09-06 NOTE — ED Provider Notes (Signed)
Emergency Medicine Provider Triage Evaluation Note  Diamond [name] , a 25 y.o. female  was evaluated in triage.  Pt complains of abdominal pain, urinary symptoms, high blood pressure.  Was sent from her GI doctor straight to the emergency room for additional work-up..  Review of Systems  Positive: Abdominal pain caused, headache, vomiting, dysuria Negative: Fever  Physical Exam  BP (!) 167/130 (BP Location: Right Arm)   Pulse (!) 106   Temp 98.9 F (37.2 C)   Resp 18   Ht 4' 11"  (1.499 m)   Wt 63.5 kg   SpO2 97%   BMI 28.28 kg/m  Gen:   Awake, no distress   Resp:  Normal effort  MSK:   Moves extremities without difficulty  Other:    Medical Decision Making  Medically screening exam initiated at 12:29 PM.  Appropriate orders placed.  Diamond [name] was informed that the remainder of the evaluation will be completed by another provider, this initial triage assessment does not replace that evaluation, and the importance of remaining in the ED until their evaluation is complete.  Labs, CT head, EKG.      Sherrill Raring, PA-C 09/06/21 1230    Lajean Saver, MD 09/06/21 1520

## 2021-09-06 NOTE — ED Notes (Signed)
Took pt's Bp 3xs and was still high. The third time the bp was 167/130

## 2021-09-06 NOTE — Discharge Instructions (Signed)
Continue taking your home medicine as prescribed.  Continue taking Reglan for the gastroparesis nausea and vomiting. If things change or worsen return back to the ED as needed. Follow-up with your primary care doctor in the next 2 days for reevaluation. If you are unable to keep medication do secondary to vomiting you need to return back to the ED for evaluation.

## 2021-09-07 ENCOUNTER — Telehealth: Payer: Self-pay | Admitting: Gastroenterology

## 2021-09-07 ENCOUNTER — Other Ambulatory Visit: Payer: Self-pay | Admitting: Gastroenterology

## 2021-09-07 ENCOUNTER — Telehealth: Payer: Self-pay | Admitting: Internal Medicine

## 2021-09-07 ENCOUNTER — Encounter: Payer: Self-pay | Admitting: Internal Medicine

## 2021-09-07 DIAGNOSIS — R1084 Generalized abdominal pain: Secondary | ICD-10-CM

## 2021-09-07 DIAGNOSIS — K3184 Gastroparesis: Secondary | ICD-10-CM

## 2021-09-07 MED ORDER — ERYTHROMYCIN ETHYLSUCCINATE 200 MG/5ML PO SUSR: 125.0000 mg | Freq: Three times a day (TID) | ORAL | 2 refills | Status: DC

## 2021-09-07 NOTE — Telephone Encounter (Signed)
Spoke with Ms. Janvier. She continues with upper and lower abdominal pain. No improvement compared to yesterday. Nausea without vomiting today. Hasn't taken Zofran or Reglan today.  She does report she is involuntarily shaking.  Reports its like she is cold and shivering, but she has not cold.  When holding something she is shaking.  It is unclear if this is a side effect from Reglan; however, we are going to have to stop the medication.  I am concerned that she is going to do poorly off of Reglan.  We will try to get her on erythromycin to see if this will provide some benefit.  As an aside, patient also tells me that she has some mild dizziness which started yesterday in the emergency room as well as a medium sized black spot obstructing the vision of her right eye.  Denies eye pain.  She did take her blood pressure medications this morning and her insulin.  My recommendations which were discussed with patient who expressed her understanding and agreement: 1. Patient is to call PCP as soon as we get off the phone to notify them of her vision changes. 2. CT A/P with contrast ASAP.  DX: Generalized abdominal pain 3. Stop Reglan and start erythromycin ethylsuccinate suspension 125 mg TID before meals.  4. Continue Zofran 8 mg q8 hours scheduled.  5. Continue Phenergan as needed. 6. Follow-up with Dr. Abbey Chatters in 4 weeks.    Stacey: Please arrange follow-up in 4 weeks with Dr. Aldona Bar Clinical Pool:  - Please arrange CT A/P with contrast ASAP.  DX: Generalized abdominal pain. - Please also let patient know I sent erythromycin to [facility] and to let us now if she has any trouble getting the medication.  *Patient said you can leave a detailed message if she doesn't answer.

## 2021-09-07 NOTE — Telephone Encounter (Signed)
ASAP CT scheduled for 10/7/22at 1:30pm, arrive at 1:15pm. NPO 4 hours prior to test. Pickup contrast at [facility] radiology today.  Called pt, informed her of CT appt. States she has to work tomorrow and can't do [place]. Reminded her Diamond Altes PA wanted CT done ASAP. Gave her phone# to central scheduling to reschedule appt to more convenient date/time. Informed her Erythromycin was sent to [place].

## 2021-09-07 NOTE — Telephone Encounter (Signed)
PATIENT RETURNED CALL, PLEASE CALL HER BACK

## 2021-09-07 NOTE — Telephone Encounter (Signed)
Attempted to call patient this morning to follow-up with her following ER evaluation yesterday. Left message on voice mail requesting return call.

## 2021-09-07 NOTE — Addendum Note (Signed)
Addended by: Hassan Rowan on: 09/07/2021 04:17 PM   Modules accepted: Orders

## 2021-09-07 NOTE — Telephone Encounter (Signed)
Communication noted.  

## 2021-09-08 ENCOUNTER — Ambulatory Visit (HOSPITAL_COMMUNITY): Admission: RE | Admit: 2021-09-08 | Payer: Medicaid Other | Source: Ambulatory Visit

## 2021-09-11 ENCOUNTER — Ambulatory Visit (HOSPITAL_COMMUNITY)
Admission: RE | Admit: 2021-09-11 | Discharge: 2021-09-11 | Disposition: A | Discharge status: Home / Self Care | Payer: Self-pay | Source: Ambulatory Visit | Attending: Gastroenterology | Admitting: Gastroenterology

## 2021-09-11 ENCOUNTER — Other Ambulatory Visit: Payer: Self-pay

## 2021-09-11 DIAGNOSIS — R1084 Generalized abdominal pain: Secondary | ICD-10-CM | POA: Insufficient documentation

## 2021-09-11 MED ORDER — IOHEXOL 300 MG/ML  SOLN
100.0000 mL | Freq: Once | INTRAMUSCULAR | Status: AC | PRN
  Administered 2021-09-11: 100 mL via INTRAVENOUS

## 2021-09-14 ENCOUNTER — Encounter (HOSPITAL_COMMUNITY): Payer: Self-pay

## 2021-09-14 ENCOUNTER — Emergency Department (HOSPITAL_COMMUNITY)
Admission: EM | Admit: 2021-09-14 | Discharge: 2021-09-15 | Disposition: A | Discharge status: Home / Self Care | Payer: Medicaid Other | Attending: Emergency Medicine | Admitting: Emergency Medicine

## 2021-09-14 ENCOUNTER — Ambulatory Visit (INDEPENDENT_AMBULATORY_CARE_PROVIDER_SITE_OTHER): Payer: Self-pay | Admitting: "Endocrinology

## 2021-09-14 ENCOUNTER — Other Ambulatory Visit: Payer: Self-pay

## 2021-09-14 ENCOUNTER — Encounter: Payer: Self-pay | Admitting: "Endocrinology

## 2021-09-14 VITALS — BP 136/96 | HR 88 | Ht 59.0 in | Wt 144.4 lb

## 2021-09-14 DIAGNOSIS — N289 Disorder of kidney and ureter, unspecified: Secondary | ICD-10-CM | POA: Insufficient documentation

## 2021-09-14 DIAGNOSIS — Z794 Long term (current) use of insulin: Secondary | ICD-10-CM | POA: Insufficient documentation

## 2021-09-14 DIAGNOSIS — E1065 Type 1 diabetes mellitus with hyperglycemia: Secondary | ICD-10-CM

## 2021-09-14 DIAGNOSIS — R1110 Vomiting, unspecified: Secondary | ICD-10-CM | POA: Insufficient documentation

## 2021-09-14 DIAGNOSIS — I10 Essential (primary) hypertension: Secondary | ICD-10-CM

## 2021-09-14 DIAGNOSIS — R112 Nausea with vomiting, unspecified: Secondary | ICD-10-CM | POA: Insufficient documentation

## 2021-09-14 DIAGNOSIS — Z79899 Other long term (current) drug therapy: Secondary | ICD-10-CM | POA: Insufficient documentation

## 2021-09-14 DIAGNOSIS — M545 Low back pain, unspecified: Secondary | ICD-10-CM | POA: Insufficient documentation

## 2021-09-14 DIAGNOSIS — J4530 Mild persistent asthma, uncomplicated: Secondary | ICD-10-CM | POA: Insufficient documentation

## 2021-09-14 DIAGNOSIS — R519 Headache, unspecified: Secondary | ICD-10-CM | POA: Insufficient documentation

## 2021-09-14 DIAGNOSIS — E785 Hyperlipidemia, unspecified: Secondary | ICD-10-CM

## 2021-09-14 DIAGNOSIS — Z91199 Patient's noncompliance with other medical treatment and regimen due to unspecified reason: Secondary | ICD-10-CM

## 2021-09-14 DIAGNOSIS — Z91040 Latex allergy status: Secondary | ICD-10-CM | POA: Insufficient documentation

## 2021-09-14 DIAGNOSIS — R300 Dysuria: Secondary | ICD-10-CM | POA: Insufficient documentation

## 2021-09-14 DIAGNOSIS — Z659 Problem related to unspecified psychosocial circumstances: Secondary | ICD-10-CM | POA: Insufficient documentation

## 2021-09-14 DIAGNOSIS — E109 Type 1 diabetes mellitus without complications: Secondary | ICD-10-CM | POA: Insufficient documentation

## 2021-09-14 DIAGNOSIS — R109 Unspecified abdominal pain: Secondary | ICD-10-CM | POA: Insufficient documentation

## 2021-09-14 DIAGNOSIS — R6883 Chills (without fever): Secondary | ICD-10-CM | POA: Insufficient documentation

## 2021-09-14 DIAGNOSIS — Z20822 Contact with and (suspected) exposure to covid-19: Secondary | ICD-10-CM | POA: Insufficient documentation

## 2021-09-14 DIAGNOSIS — R059 Cough, unspecified: Secondary | ICD-10-CM | POA: Insufficient documentation

## 2021-09-14 DIAGNOSIS — Z7951 Long term (current) use of inhaled steroids: Secondary | ICD-10-CM | POA: Insufficient documentation

## 2021-09-14 LAB — PREGNANCY, URINE: Preg Test, Ur: NEGATIVE

## 2021-09-14 MED ORDER — DEXCOM G6 RECEIVER DEVI: 1.0000 | 0 refills | Status: DC | PRN

## 2021-09-14 MED ORDER — DEXCOM G6 SENSOR MISC: 4.0000 | 2 refills | Status: DC

## 2021-09-14 MED ORDER — DEXCOM G6 TRANSMITTER MISC: 1.0000 | 1 refills | Status: DC

## 2021-09-14 MED ORDER — INSULIN GLARGINE 100 UNIT/ML ~~LOC~~ SOLN: 30.0000 [IU] | Freq: Every day | SUBCUTANEOUS | 1 refills | Status: AC

## 2021-09-14 NOTE — Progress Notes (Signed)
09/14/2021, 6:12 PM  Endocrinology follow-up note   Subjective:    Patient ID: Diamond [name], female    DOB: 08/02/1996.  Diamond [name] is being seen in follow-up after she was seen in consultation for management of currently uncontrolled symptomatic type 1 diabetes.  --  she missed several appointments, came in unaccompanied by her aide who is usually assisting her with her monitoring and insulin administration activities.    Past Medical History:  Diagnosis Date   ADHD (attention deficit hyperactivity disorder) 2007   Allergy    Arm DVT (deep venous thromboembolism), acute, right (Diamond [name]) 04/07/2021   Asthma    Bipolar disorder (HCC)    Crohn's colitis ([place])    Dx Dr. Cherrie Gauze   Diabetic nephropathy [facility])    Discitis of lumbar region 05/29/2021   Edema    Eosinophilic esophagitis    GERD (gastroesophageal reflux disease)    Hypertension    MSSA bacteremia 04/07/2021   Nephrotic syndrome    Pancreatitis    Panic attacks    PTSD (post-traumatic stress disorder)    Stroke ([place]) 07/2020   R side   Type 1 diabetes ([place]) 2008    Past Surgical History:  Procedure Laterality Date   ADENOIDECTOMY     BIOPSY  02/01/2021   Procedure: BIOPSY;  Surgeon: Diamond Houston, MD;  [facility];  Service: Endoscopy;;  duodenal, esophageal,   BIOPSY  02/03/2021   Procedure: BIOPSY;  Surgeon: Diamond Harman, DO;  Location: [facility];  Service: Endoscopy;;   COLONOSCOPY WITH PROPOFOL N/A 02/01/2021   Procedure: COLONOSCOPY WITH PROPOFOL;  Surgeon: Diamond Houston, MD;  Location[facility];  Service: Endoscopy;  Laterality: N/A;   COLONOSCOPY WITH PROPOFOL N/A 02/03/2021   Surgeon: Diamond Harman, DO; preparation of the colon was fair after copious lavage, nonbleeding internal hemorrhoids, TI appeared normal s/p biopsy which revealed chronic ileitis with no activity,  granulomata, or malignancy.   ESOPHAGOGASTRODUODENOSCOPY (EGD) WITH PROPOFOL N/A 02/01/2021   Surgeon: Diamond Houston, MD; longitudinally marked mucosal in the esophagus biopsied (chronic esophagitis with increased eosinophils compatible with EOE), normal stomach and duodenum.  Duodenum was biopsied with unremarkable pathology.   HERNIA REPAIR     TEE WITHOUT CARDIOVERSION N/A 03/09/2021   Procedure: TRANSESOPHAGEAL ECHOCARDIOGRAM (TEE) WITH PROPOFOL;  Surgeon: Diamond Lenis, MD;  Location[facility];  Service: Endoscopy;  Laterality: N/A;   TONSILLECTOMY      Social History   Socioeconomic History   Marital status: Single    Spouse name: Not on file   Number of children: Not on file   Years of education: Not on file   Highest education level: Not on file  Occupational History   Not on file  Tobacco Use   Smoking status: Never   Smokeless tobacco: Never  Vaping Use   Vaping Use: Never used  Substance and Sexual Activity   Alcohol use: No   Drug use: No   Sexual activity: Not Currently  Other Topics Concern   Not on file  Social History Narrative  Not on file   Social Determinants of Health   Financial Resource Strain: Not on file  Food Insecurity: Not on file  Transportation Needs: Not on file  Physical Activity: Not on file  Stress: Not on file  Social Connections: Not on file    Family History  Problem Relation Age of Onset   Hypertension Mother    Diabetes Mother    Heart disease Mother    Crohn's disease Father    Alcohol abuse Father    Drug abuse Father    Mental illness Father    Cancer Father    Hypertension Maternal Aunt    Seizures Maternal Aunt    Depression Maternal Aunt    Asthma Maternal Uncle    Hypertension Maternal Uncle    Cancer Maternal Grandmother    Diabetes Maternal Grandmother    Mental illness Maternal Grandfather     Outpatient Encounter Medications as of 09/14/2021  Medication Sig   acetaminophen (TYLENOL) 500 MG  tablet Take by mouth.   Continuous Blood Gluc Receiver (DEXCOM G6 RECEIVER) DEVI 1 Piece by Does not apply route as needed.   Continuous Blood Gluc Sensor (DEXCOM G6 SENSOR) MISC 4 Pieces by Does not apply route once a week.   Continuous Blood Gluc Transmit (DEXCOM G6 TRANSMITTER) MISC 1 Piece by Does not apply route as directed.   albuterol (VENTOLIN HFA) 108 (90 Base) MCG/ACT inhaler Inhale 2 puffs into the lungs every 6 (six) hours as needed for wheezing or shortness of breath. For shortness of breath   erythromycin ethylsuccinate (EES) 200 MG/5ML suspension Take 3.1 mLs (125 mg total) by mouth 3 (three) times daily before meals. Take 30 minutes before breakfast, lunch, and dinner. (Patient not taking: Reported on 09/14/2021)   furosemide (LASIX) 40 MG tablet Take 1 tablet (40 mg total) by mouth 2 (two) times daily. (Patient taking differently: Take 80 mg by mouth 2 (two) times daily.)   gabapentin (NEURONTIN) 100 MG capsule Take 1 capsule (100 mg total) by mouth 2 (two) times daily.   hydrALAZINE (APRESOLINE) 25 MG tablet Take 1 tablet (25 mg total) by mouth 2 (two) times daily.   insulin glargine (LANTUS) 100 UNIT/ML injection Inject 0.3 mLs (30 Units total) into the skin at bedtime.   Insulin Glulisine (APIDRA [place]) Inject 10-16 Units into the skin 3 (three) times daily before meals. ssi   isosorbide mononitrate (IMDUR) 30 MG 24 hr tablet Take 0.5 tablets (15 mg total) by mouth daily. (Patient taking differently: Take 30 mg by mouth daily.)   metoprolol tartrate (LOPRESSOR) 50 MG tablet Take 1 tablet (50 mg total) by mouth 2 (two) times daily.   omeprazole (PRILOSEC) 40 MG capsule Take 1 capsule (40 mg total) by mouth in the morning and at bedtime.   ondansetron (ZOFRAN ODT) 8 MG disintegrating tablet Take 1 tablet (8 mg total) by mouth every 8 (eight) hours as needed for nausea or vomiting.   [DISCONTINUED] insulin glargine (LANTUS) 100 UNIT/ML injection Inject 0.3 mLs (30 Units total) into the  skin at bedtime. (Patient taking differently: Inject 40 Units into the skin daily.)   No facility-administered encounter medications on file as of 09/14/2021.    ALLERGIES: Allergies  Allergen Reactions   Amoxicillin Hives    Did it involve swelling of the face/tongue/throat, SOB, or low BP? no  Did it involve sudden or severe rash/hives, skin peeling, or any reaction on the inside of your mouth or nose? no Did you need to seek medical  attention at a hospital or doctor's office? no When did it last happen?   unk    If all above answers are "NO", may proceed with cephalosporin use.   Dexamethasone Hives and Itching   Doxycycline Itching and Swelling   Penicillins Hives and Itching    Did it involve swelling of the face/tongue/throat, SOB, or low BP? no  Did it involve sudden or severe rash/hives, skin peeling, or any reaction on the inside of your mouth or nose? no Did you need to seek medical attention at a hospital or doctor's office? no When did it last happen?   unk    If all above answers are "NO", may proceed with cephalosporin use. Did it involve swelling of the face/tongue/throat, SOB, or low BP? no  Did it involve sudden or severe rash/hives, skin peeling, or any reac   Ceftriaxone Sodium In Dextrose Itching   Augmentin [Amoxicillin-Pot Clavulanate] Other (See Comments)    Reaction unknown   Clavulanic Acid    Reglan [Metoclopramide]     Shaking Unclear if Reglan was causing shaking, but medication was discontinued on 10/6 for this reason.    Adhesive [Tape] Itching and Rash   Latex Itching and Rash   Other Itching and Rash   Pineapple Rash    Rash on tongue and throat    VACCINATION STATUS: Immunization History  Administered Date(s) Administered   HPV Quadrivalent 08/27/2012   Hepatitis A 08/27/2012   Influenza Split 08/27/2012, 11/20/2019   Influenza, Seasonal, Injecte, Preservative Fre 09/05/2019   Meningococcal Conjugate 08/27/2012   Moderna Sars-Covid-2  Vaccination 03/02/2020, 03/29/2020   Pneumococcal Polysaccharide-23 01/26/2021   Tdap 06/18/2008    Diabetes She presents for her follow-up diabetic visit. She has type 1 diabetes mellitus. Onset time: She was diagnosed at approximate age of 36 years. Her disease course has been worsening (she has had several ER visits in the interim.). Pertinent negatives for hypoglycemia include no confusion, headaches, pallor or seizures. Associated symptoms include blurred vision, fatigue, foot paresthesias, foot ulcerations, polydipsia and polyuria. Pertinent negatives for diabetes include no chest pain and no polyphagia. There are no hypoglycemic complications. Symptoms are worsening. Diabetic complications include a CVA and peripheral neuropathy. (Lack of adequate health insurance.  Noncompliance/nonengagement.) Risk factors for coronary artery disease include diabetes mellitus, family history, sedentary lifestyle and tobacco exposure. Current diabetic treatment includes insulin injections. She is compliant with treatment some of the time. Her weight is increasing steadily. She is following a generally unhealthy diet. When asked about meal planning, she reported none. She has not had a previous visit with a dietitian. She never participates in exercise. Her home blood glucose trend is decreasing steadily. Her breakfast blood glucose range is generally >200 mg/dl. Her bedtime blood glucose range is generally >200 mg/dl. Her overall blood glucose range is >200 mg/dl. (Her meter shows rare , random monitoring 1-2 times a day averaging 207 for the last 7 days , 199 for the last 14 days.  Her recent a1c was 13.5% increasing from 8.5% during her last visit.) An ACE inhibitor/angiotensin II receptor blocker is being taken.    Review of Systems  Constitutional:  Positive for fatigue. Negative for chills, fever and unexpected weight change.  HENT:  Negative for trouble swallowing and voice change.   Eyes:  Positive for  blurred vision. Negative for visual disturbance.  Respiratory:  Negative for cough, shortness of breath and wheezing.   Cardiovascular:  Negative for chest pain, palpitations and leg swelling.  Gastrointestinal:  Positive for nausea and vomiting. Negative for diarrhea.  Endocrine: Positive for polydipsia and polyuria. Negative for cold intolerance, heat intolerance and polyphagia.  Musculoskeletal:  Negative for arthralgias and myalgias.  Skin:  Negative for color change, pallor, rash and wound.  Neurological:  Negative for seizures and headaches.  Psychiatric/Behavioral:  Negative for confusion and suicidal ideas.    Objective:    Vitals with BMI 09/14/2021 09/06/2021 09/06/2021  Height 4' 11"  - -  Weight 144 lbs 6 oz - -  BMI 85.92 - -  Systolic 924 462 863  Diastolic 96 94 817  Pulse 88 104 103    BP (!) 136/96   Pulse 88   Ht 4' 11"  (1.499 m)   Wt 144 lb 6.4 oz (65.5 kg)   BMI 29.17 kg/m   Wt Readings from Last 3 Encounters:  09/14/21 144 lb 6.4 oz (65.5 kg)  09/06/21 140 lb (63.5 kg)  09/06/21 140 lb 9.6 oz (63.8 kg)       CMP ( most recent) CMP     Component Value Date/Time   NA 134 (L) 09/06/2021 1340   K 3.9 09/06/2021 1340   CL 100 09/06/2021 1340   CO2 26 09/06/2021 1340   GLUCOSE 536 (HH) 09/06/2021 1340   BUN 18 09/06/2021 1340   CREATININE 1.30 (H) 09/06/2021 1340   CREATININE 0.69 07/08/2012 1203   CALCIUM 8.7 (L) 09/06/2021 1340   CALCIUM 9.2 07/13/2021 1416   PROT 7.5 09/06/2021 1340   ALBUMIN 2.8 (L) 09/06/2021 1340   AST 16 09/06/2021 1340   ALT 15 09/06/2021 1340   ALKPHOS 116 09/06/2021 1340   BILITOT 1.0 09/06/2021 1340   GFRNONAA 59 (L) 09/06/2021 1340   GFRAA >60 08/26/2020 0507    Diabetic Labs (most recent): Lab Results  Component Value Date   HGBA1C 13.5 (H) 08/11/2021   HGBA1C 8.7 (H) 04/01/2021   HGBA1C 10.4 (H) 03/16/2021    Lab Results  Component Value Date   TSH 2.228 01/30/2021   TSH 1.078 08/25/2020   TSH 1.181  07/08/2012      Assessment & Plan:   1. Uncontrolled type 1 diabetes mellitus with hyperglycemia (HCC) - PRUDIE GUTHRIDGE has currently uncontrolled symptomatic type 1 DM since  25 years of age. Her work-up in the hospital was negative for CHF, or ascites.  She has hypoalbuminemia. - Her meter shows rare , random monitoring 1-2 times a day averaging 207 for the last 7 days , 199 for the last 14 days.  Her recent a1c was 13.5% increasing from 8.5% during her last visit.  - Recent labs reviewed. - I had a long discussion with her and her aide about the progressive nature of diabetes and the pathology behind its complications. -her diabetes is complicated by chronic nonadherence/noncompliance, peripheral neuropathy and she remains at exceedingly  high risk for more acute and chronic complications which include CAD, CVA, CKD, retinopathy, and neuropathy. These are all discussed in detail with her.  - I have counseled her on diet management  by adopting a carbohydrate restricted/protein rich diet. Patient is encouraged to switch to  unprocessed or minimally processed     complex starch and increased protein intake (animal or plant source), fruits, and vegetables.  -  she is advised to stick to a routine mealtimes to eat 3 meals  a day and avoid unnecessary snacks ( to snack only to correct hypoglycemia).  - she acknowledges that there is a room for improvement in her  food and drink choices. - Suggestion is made for her to avoid simple carbohydrates  from her diet including Cakes, Sweet Desserts, Ice Cream, Soda (diet and regular), Sweet Tea, Candies, Chips, Cookies, Store Bought Juices, Alcohol in Excess of  1-2 drinks a day, Artificial Sweeteners,  Coffee Creamer, and "Sugar-free" Products, Lemonade. This will help patient to have more stable blood glucose profile and potentially avoid unintended weight gain.   - she has been scheduled with Jearld Fenton, RDN, CDE for diabetes education.  She did  not keep her follow-up.  - I have reapproached her with the following individualized plan to manage  her diabetes and patient agrees:   - she is not capable of self care, alarmingly non-compliant. She needs assistance either from integrative medicine or placement in a group home/assisted living. - she utilizes ER, Hospital excessively because she ca not comprehend with proper monitoring and appropriate utility of insulin. She is at extremely high risk for acute complications of diabetes such as DKA and chronic complications some of which she already has.  -She is advised to resume Lantus at 30 units nightly, continue Apidra 10  units 3 times a day with meals  for pre-meal BG readings of 90-12m/dl, plus patient specific correction dose for unexpected hyperglycemia above 1557mdl, associated with strict monitoring of glucose 4 times a day-before meals and at bedtime. - she is warned not to take insulin without proper monitoring per orders. - Adjustment parameters are given to her for hypo and hyperglycemia in writing. - she is encouraged to call clinic for blood glucose levels less than 70 or above 200 mg /dl. - she will greatly benefit from a CGM device. I discussed a prescribed DEXCOM for her. If this does not go through , we will try [employer] - she is not a candidate for metformin, SGLT2 inhibitors, nor incretin therapy.   - Specific targets for  A1c;  LDL, HDL,  and Triglycerides were discussed with the patient.  2) Blood Pressure /Hypertension: Her blood pressure is controlled to target.  She is advised to continue hydralazine 25 mg p.o. twice daily, metoprolol 50 mg p.o. twice daily.    3) Lipids/Hyperlipidemia: She does not have any lipid panel to review.     She will be considered for fasting lipid panel on subsequent visits.   4)  Weight/Diet:  Body mass index is 29.17 kg/m.  -She is not a candidate for weight loss.  Exercise, and detailed carbohydrates information  provided  -  detailed on discharge instructions.  5) Chronic Care/Health Maintenance:  -she  is not  on ACEI/ARB and Statin medications and  is encouraged to initiate and continue to follow up with Ophthalmology, Dentist,  Podiatrist at least yearly or according to recommendations, and advised to   stay away from smoking. I have recommended yearly flu vaccine and pneumonia vaccine at least every 5 years;  and  sleep for at least 7 hours a day.  Patient needs a lot of social work due to that she does not realize how high risk she is for diabetes complications.   - she is  advised to maintain close follow up with her PCP for primary care needs, as well as her other providers for optimal and coordinated care.   I spent 45 minutes in the care of the patient today including review of [facility], Thyroid Function, Hematology (current and previous including abstractions from other facilities); face-to-face time discussing  her blood glucose readings/logs,  discussing hypoglycemia and hyperglycemia episodes and symptoms, medications doses, her options of short and long term treatment based on the latest standards of care / guidelines;  discussion about incorporating lifestyle medicine;  and documenting the encounter.    Please refer to Patient Instructions for Blood Glucose Monitoring and Insulin/Medications Dosing Guide"  in media tab for additional information. Please  also refer to " Patient Self Inventory" in the Media  tab for reviewed elements of pertinent patient history.  [name] participated in the discussions, expressed understanding, and voiced agreement with the above plans.  All questions were answered to her satisfaction. she is encouraged to contact clinic should she have any questions or concerns prior to her return visit.    Follow up plan: - Return in about 2 weeks (around 09/28/2021) for F/U with Meter and Logs Only - no Labs.  Glade Lloyd, MD [facility]  [facility] [address] [place], [place] [address] Phone: [phone]  Fax: [phone]    09/14/2021, 6:12 PM  This note was partially dictated with voice recognition software. Similar sounding words can be transcribed inadequately or may not  be corrected upon review.

## 2021-09-14 NOTE — Patient Instructions (Signed)

## 2021-09-14 NOTE — ED Triage Notes (Signed)
Pt to ED with c/o dysuria x 3 weeks, pt says she saw gastrologist today and was told to come here for pyelonephritis of right kidney as pt doesn't have funds and is having trouble keeping pills down.

## 2021-09-14 NOTE — ED Notes (Addendum)
Lab has attempted to get blood twice and unable to do so- pt is  a hard stick, Dr Laverta Baltimore made aware- advised to hold on labs at this time and wait for oncoming EDP to see pt, but go ahead and send urine.

## 2021-09-15 LAB — COMPREHENSIVE METABOLIC PANEL
ALT: 12 U/L (ref 0–44)
AST: 16 U/L (ref 15–41)
Albumin: 2.1 g/dL — ABNORMAL LOW (ref 3.5–5.0)
Alkaline Phosphatase: 81 U/L (ref 38–126)
Anion gap: 6 (ref 5–15)
BUN: 19 mg/dL (ref 6–20)
CO2: 25 mmol/L (ref 22–32)
Calcium: 7.9 mg/dL — ABNORMAL LOW (ref 8.9–10.3)
Chloride: 107 mmol/L (ref 98–111)
Creatinine, Ser: 1.93 mg/dL — ABNORMAL HIGH (ref 0.44–1.00)
GFR, Estimated: 36 mL/min — ABNORMAL LOW (ref >60)
Glucose, Bld: 316 mg/dL — ABNORMAL HIGH (ref 70–99)
Potassium: 3.6 mmol/L (ref 3.5–5.1)
Sodium: 138 mmol/L (ref 135–145)
Total Bilirubin: 1.1 mg/dL (ref 0.3–1.2)
Total Protein: 5.8 g/dL — ABNORMAL LOW (ref 6.5–8.1)

## 2021-09-15 LAB — CBC WITH DIFFERENTIAL/PLATELET
Abs Immature Granulocytes: 0.01 10*3/uL (ref 0.00–0.07)
Basophils Absolute: 0.1 10*3/uL (ref 0.0–0.1)
Basophils Relative: 1 %
Eosinophils Absolute: 0.6 10*3/uL — ABNORMAL HIGH (ref 0.0–0.5)
Eosinophils Relative: 9 %
HCT: 32 % — ABNORMAL LOW (ref 36.0–46.0)
Hemoglobin: 10.7 g/dL — ABNORMAL LOW (ref 12.0–15.0)
Immature Granulocytes: 0 %
Lymphocytes Relative: 31 %
Lymphs Abs: 1.9 10*3/uL (ref 0.7–4.0)
MCH: 30.1 pg (ref 26.0–34.0)
MCHC: 33.4 g/dL (ref 30.0–36.0)
MCV: 90.1 fL (ref 80.0–100.0)
Monocytes Absolute: 0.4 10*3/uL (ref 0.1–1.0)
Monocytes Relative: 6 %
Neutro Abs: 3.2 10*3/uL (ref 1.7–7.7)
Neutrophils Relative %: 53 %
Platelets: 341 10*3/uL (ref 150–400)
RBC: 3.55 MIL/uL — ABNORMAL LOW (ref 3.87–5.11)
RDW: 12.2 % (ref 11.5–15.5)
WBC: 6.1 10*3/uL (ref 4.0–10.5)
nRBC: 0 % (ref 0.0–0.2)

## 2021-09-15 LAB — URINALYSIS, ROUTINE W REFLEX MICROSCOPIC
Bilirubin Urine: NEGATIVE
Glucose, UA: >=500 mg/dL — AB
Hgb urine dipstick: NEGATIVE
Ketones, ur: NEGATIVE mg/dL
Leukocytes,Ua: NEGATIVE
Nitrite: NEGATIVE
Protein, ur: >=300 mg/dL — AB
Specific Gravity, Urine: 1.008 (ref 1.005–1.030)
pH: 7 (ref 5.0–8.0)

## 2021-09-15 LAB — RESP PANEL BY RT-PCR (FLU A&B, COVID) ARPGX2
Influenza A by PCR: NEGATIVE
Influenza B by PCR: NEGATIVE
SARS Coronavirus 2 by RT PCR: NEGATIVE

## 2021-09-15 LAB — LIPASE, BLOOD: Lipase: 22 U/L (ref 11–51)

## 2021-09-15 MED ORDER — CIPROFLOXACIN HCL 250 MG PO TABS
500.0000 mg | ORAL_TABLET | Freq: Once | ORAL | Status: AC
  Administered 2021-09-15: 500 mg via ORAL
  Filled 2021-09-15: qty 2

## 2021-09-15 MED ORDER — CIPROFLOXACIN HCL 500 MG PO TABS: 500.0000 mg | ORAL_TABLET | Freq: Two times a day (BID) | ORAL | 0 refills | Status: DC

## 2021-09-15 NOTE — ED Notes (Signed)
Pt has consumed cup of ice water about 2 hours ago and has not vomited or c/o nausea

## 2021-09-15 NOTE — ED Provider Notes (Signed)
[facility] EMERGENCY DEPARTMENT Provider Note   CSN: [id number] Arrival date & time: 09/14/21  1941     History Chief Complaint  Patient presents with   Dysuria    Diamond [name] is a 25 y.o. female.  The history is provided by the patient.  Dysuria Pain severity:  Moderate Onset quality:  Gradual Timing:  Intermittent Progression:  Worsening Chronicity:  New Relieved by:  Nothing Worsened by:  Nothing Associated symptoms: abdominal pain, nausea and vomiting   Patient with extensive history including bipolar, nephrotic syndrome presents with multiple complaints   Patient reports dysuria for several weeks.  She also reports having back pain and suprapubic pain.  She reports she is having chills.  She also reports cough and headache.  She is also had episodes of emesis.  She reports she had a recent CT scan and was advised to go to the ER for further evaluation Past Medical History:  Diagnosis Date   ADHD (attention deficit hyperactivity disorder) 2007   Allergy    Arm DVT (deep venous thromboembolism), acute, right ([place]) 04/07/2021   Asthma    Bipolar disorder ([place])    Crohn's colitis ([place])    Dx Dr. Cherrie Gauze   Diabetic nephropathy [facility])    Discitis of lumbar region 05/29/2021   Edema    Eosinophilic esophagitis    GERD (gastroesophageal reflux disease)    Hypertension    MSSA bacteremia 04/07/2021   Nephrotic syndrome    Pancreatitis    Panic attacks    PTSD (post-traumatic stress disorder)    Stroke ([place]) 07/2020   R side   Type 1 diabetes ([place]) 2008    Patient Active Problem List   Diagnosis Date Noted   Psychosocial problem 09/14/2021   Rectal bleeding 09/06/2021   Suprapubic pain 09/06/2021   Odynophagia 09/06/2021   Atypical chest pain    Elevated troponin    Diabetic ketoacidosis without coma associated with type 1 diabetes mellitus ([place])    DKA, type 1 ([place]) 08/11/2021   Discitis of lumbar region 05/29/2021   Diarrhea 04/11/2021    Pain of upper abdomen 64/33/2951   Eosinophilic esophagitis 88/41/6606   MSSA bacteremia 04/07/2021   Arm DVT (deep venous thromboembolism), acute, right ([place]) 04/07/2021   AKI (acute kidney injury) ([place]) 04/01/2021   Pyelonephritis 03/31/2021   Severe low back pain    Intractable nausea and vomiting 03/16/2021   Nausea and vomiting    DM1 with Diabetic nephropathy with proteinuria and Nephrotic Syndrome- 03/10/2021    Class: Chronic   Community acquired pneumonia 03/05/2021   Type 2 or unspecified type diabetes mellitus 02/23/2021   Bladder outlet obstruction    Constipation    Abdominal pain, epigastric    Abnormal computed tomography of cecum and terminal ileum    Hyperosmolar hyperglycemic state (HHS) ([place]) 01/30/2021   Crohn's disease ([place]) 12/08/2020   Diabetic gastroparesis ([place]) 12/08/2020   Essential hypertension, benign 11/01/2020   Edema 10/19/2020   Anasarca 10/18/2020   Hypoalbuminemia-due to proteinuria/nephrotic syndrome 10/18/2020   Personal history of noncompliance with medical treatment, presenting hazards to health 10/12/2020   Bilateral lower extremity edema 08/25/2020   Pneumonia 08/25/2020   Ileus ([place]) 08/06/2020   Methicillin susceptible Staphylococcus aureus infection, unspecified site 07/08/2020   Cellulitis and abscess of buttock 06/20/2020   Skin abscess 04/24/2020   Mild intermittent asthma without complication 30/16/0109   Sepsis due to skin infection ([place]) 04/17/2020   Type 1 diabetes mellitus, uncontrolled 10/01/2013  Eczema 10/23/2012   Flank pain 08/27/2012   Gastroesophageal reflux disease 07/25/2012   Mood disorder ([place]) 07/09/2012   Contraception management 07/09/2012   History of Crohn's disease 07/09/2012   Uncontrolled type 1 diabetes mellitus with hyperglycemia and Diabetic Nephropathy 07/08/2012    Class: Chronic   Asthma, persistent controlled 07/08/2012    Past Surgical History:  Procedure Laterality Date   ADENOIDECTOMY      BIOPSY  02/01/2021   Procedure: BIOPSY;  Surgeon: Rogene Houston, MD;  Location: [facility] ENDO SUITE;  Service: Endoscopy;;  duodenal, esophageal,   BIOPSY  02/03/2021   Procedure: BIOPSY;  Surgeon: Eloise Harman, DO;  Location: [facility] ENDO SUITE;  Service: Endoscopy;;   COLONOSCOPY WITH PROPOFOL N/A 02/01/2021   Procedure: COLONOSCOPY WITH PROPOFOL;  Surgeon: Rogene Houston, MD;  Location: [facility] ENDO SUITE;  Service: Endoscopy;  Laterality: N/A;   COLONOSCOPY WITH PROPOFOL N/A 02/03/2021   Surgeon: Eloise Harman, DO; preparation of the colon was fair after copious lavage, nonbleeding internal hemorrhoids, TI appeared normal s/p biopsy which revealed chronic ileitis with no activity, granulomata, or malignancy.   ESOPHAGOGASTRODUODENOSCOPY (EGD) WITH PROPOFOL N/A 02/01/2021   Surgeon: Rogene Houston, MD; longitudinally marked mucosal in the esophagus biopsied (chronic esophagitis with increased eosinophils compatible with EOE), normal stomach and duodenum.  Duodenum was biopsied with unremarkable pathology.   HERNIA REPAIR     TEE WITHOUT CARDIOVERSION N/A 03/09/2021   Procedure: TRANSESOPHAGEAL ECHOCARDIOGRAM (TEE) WITH PROPOFOL;  Surgeon: Arnoldo Lenis, MD;  Location: [facility] ENDO [facility];  Service: Endoscopy;  Laterality: N/A;   TONSILLECTOMY       OB History   No obstetric history on file.     Family History  Problem Relation Age of Onset   Hypertension Mother    Diabetes Mother    Heart disease Mother    Crohn's disease Father    Alcohol abuse Father    Drug abuse Father    Mental illness Father    Cancer Father    Hypertension Maternal Aunt    Seizures Maternal Aunt    Depression Maternal Aunt    Asthma Maternal Uncle    Hypertension Maternal Uncle    Cancer Maternal Grandmother    Diabetes Maternal Grandmother    Mental illness Maternal Grandfather     Social History   Tobacco Use   Smoking status: Never   Smokeless tobacco: Never  Vaping Use   Vaping Use: Never  used  Substance Use Topics   Alcohol use: No   Drug use: No    Home Medications Prior to Admission medications   Medication Sig Start Date End Date Taking? Authorizing Provider  ciprofloxacin (CIPRO) 500 MG tablet Take 1 tablet (500 mg total) by mouth every 12 (twelve) hours. 09/15/21  Yes Ripley Fraise, MD  acetaminophen (TYLENOL) 500 MG tablet Take by mouth. 04/21/20   [provider]  albuterol (VENTOLIN HFA) 108 (90 Base) MCG/ACT inhaler Inhale 2 puffs into the lungs every 6 (six) hours as needed for wheezing or shortness of breath. For shortness of breath 03/10/21   Roxan Hockey, MD  Continuous Blood Gluc Receiver (DEXCOM G6 RECEIVER) DEVI 1 Piece by Does not apply route as needed. 09/14/21   Cassandria Anger, MD  Continuous Blood Gluc Sensor (DEXCOM G6 SENSOR) MISC 4 Pieces by Does not apply route once a week. 09/14/21   Cassandria Anger, MD  Continuous Blood Gluc Transmit (DEXCOM G6 TRANSMITTER) MISC 1 Piece by Does not apply route  as directed. 09/14/21   Cassandria Anger, MD  erythromycin ethylsuccinate (EES) 200 MG/5ML suspension Take 3.1 mLs (125 mg total) by mouth 3 (three) times daily before meals. Take 30 minutes before breakfast, lunch, and dinner. Patient not taking: Reported on 09/14/2021 09/07/21   Erenest Rasher, PA-C  furosemide (LASIX) 40 MG tablet Take 1 tablet (40 mg total) by mouth 2 (two) times daily. Patient taking differently: Take 80 mg by mouth 2 (two) times daily. 08/17/21   Fredia Sorrow, MD  gabapentin (NEURONTIN) 100 MG capsule Take 1 capsule (100 mg total) by mouth 2 (two) times daily. 03/10/21   Roxan Hockey, MD  hydrALAZINE (APRESOLINE) 25 MG tablet Take 1 tablet (25 mg total) by mouth 2 (two) times daily. 03/10/21   Roxan Hockey, MD  insulin glargine (LANTUS) 100 UNIT/ML injection Inject 0.3 mLs (30 Units total) into the skin at bedtime. 09/14/21   Cassandria Anger, MD  Insulin Glulisine (APIDRA [place]) Inject 10-16 Units  into the skin 3 (three) times daily before meals. ssi    [provider]  isosorbide mononitrate (IMDUR) 30 MG 24 hr tablet Take 0.5 tablets (15 mg total) by mouth daily. Patient taking differently: Take 30 mg by mouth daily. 03/19/21   Barton Dubois, MD  metoprolol tartrate (LOPRESSOR) 50 MG tablet Take 1 tablet (50 mg total) by mouth 2 (two) times daily. 03/23/21   [name], PA-C  omeprazole (PRILOSEC) 40 MG capsule Take 1 capsule (40 mg total) by mouth in the morning and at bedtime. 08/16/21   Barton Dubois, MD  ondansetron (ZOFRAN ODT) 8 MG disintegrating tablet Take 1 tablet (8 mg total) by mouth every 8 (eight) hours as needed for nausea or vomiting. 03/10/21   Roxan Hockey, MD    Allergies    Amoxicillin, Dexamethasone, Doxycycline, Penicillins, Ceftriaxone sodium in dextrose, Augmentin [amoxicillin-pot clavulanate], Clavulanic acid, Reglan [metoclopramide], Adhesive [tape], Latex, Other, and Pineapple  Review of Systems   Review of Systems  Constitutional:  Positive for chills.  Cardiovascular:  Negative for chest pain.  Gastrointestinal:  Positive for abdominal pain, nausea and vomiting.  Genitourinary:  Positive for dysuria.  All other systems reviewed and are negative.  Physical Exam Updated Vital Signs BP (!) 149/110   Pulse (!) 103   Temp 98.2 F (36.8 C) (Oral)   Resp 18   Ht 1.499 m (4' 11" )   Wt 65.4 kg   SpO2 97%   BMI 29.13 kg/m   Physical Exam CONSTITUTIONAL: Chronically ill-appearing, no acute distress HEAD: Normocephalic/atraumatic EYES: EOMI/PERRL ENMT: Mucous membranes moist NECK: supple no meningeal signs SPINE/BACK:entire spine nontender, no bruising/crepitance/stepoffs noted to spine CV: S1/S2 noted, mild tachycardia LUNGS: Lungs are clear to auscultation bilaterally, no apparent distress ABDOMEN: soft, mild suprapubic tenderness, no rebound or guarding, bowel sounds noted throughout abdomen GU: Bilateral flank pain, no bruising or  erythema NEURO: Pt is awake/alert/appropriate, moves all extremitiesx4.  No facial droop.   EXTREMITIES: pulses normal/equal, full ROM, peripheral edema is noted bilaterally in the lower extremities SKIN: warm, color normal PSYCH: no abnormalities of mood noted, alert and oriented to situation  ED Results / Procedures / Treatments   Labs (all labs ordered are listed, but only abnormal results are displayed) Labs Reviewed  URINALYSIS, ROUTINE W REFLEX MICROSCOPIC - Abnormal; Notable for the following components:      Result Value   APPearance HAZY (*)    Glucose, UA >=500 (*)    Protein, ur >=300 (*)    Bacteria, UA  FEW (*)    All other components within normal limits  CBC WITH DIFFERENTIAL/PLATELET - Abnormal; Notable for the following components:   RBC 3.55 (*)    Hemoglobin 10.7 (*)    HCT 32.0 (*)    Eosinophils Absolute 0.6 (*)    All other components within normal limits  COMPREHENSIVE METABOLIC PANEL - Abnormal; Notable for the following components:   Glucose, Bld 316 (*)    Creatinine, Ser 1.93 (*)    Calcium 7.9 (*)    Total Protein 5.8 (*)    Albumin 2.1 (*)    GFR, Estimated 36 (*)    All other components within normal limits  RESP PANEL BY RT-PCR (FLU A&B, COVID) ARPGX2  URINE CULTURE  PREGNANCY, URINE  LIPASE, BLOOD    EKG None  Radiology No results found.  Procedures Procedures   Medications Ordered in ED Medications  ciprofloxacin (CIPRO) tablet 500 mg (500 mg Oral Given 09/15/21 0326)    ED Course  I have reviewed the triage vital signs and the nursing notes.  Pertinent labs & imaging results that were available during my care of the patient were reviewed by me and considered in my medical decision making (see chart for details).    MDM Rules/Calculators/A&P                           Patient with multiple medical conditions including nephrotic syndrome chronic kidney disease and diabetes, reports dysuria and flank pain for several weeks.   Patient had CT abdomen pelvis on October 10.  These findings are similar but somewhat improved to a CT scan from September 10, and no renal abscess was noted.  Patient's urinalysis is unremarkable.  She has been drinking fluids in the ER without difficulty Labs are pending at this time  patient is not septic appearing 4:08 AM Patient monitored for several hours and has been stable.  She is afebrile.  She is taking oral fluids without difficulty.  She was sleeping on reassessment in no acute distress.  Heart rate is improved to the low 100s. She is not septic appearing. Urine culture has been sent.  I did see the telephone note that recommended patient go to the ER.  However I do not feel admission is warranted since she has stabilized in the ER and is taking oral fluids.  She can be treated for potential pyelonephritis as an outpatient.  I discussed the case with pharmacy who did an extensive chart review and revealed the patient cannot tolerate Cipro.  Patient does have 10+ allergies which limits multiple antibiotics. Pharmacy recommends Cipro 500 mg twice daily for 1 week as this should be safe even with her renal insufficiency. Patient does have hyperglycemia without anion gap. Patient is otherwise appropriate for discharge home. Patient reports difficulty getting medications due to cost, will consult social work Final Clinical Impression(s) / ED Diagnoses Final diagnoses:  Dysuria  Renal insufficiency    Rx / DC Orders ED Discharge Orders          Ordered    ciprofloxacin (CIPRO) 500 MG tablet  Every 12 hours        09/15/21 0408             Ripley Fraise, MD 09/15/21 0410

## 2021-09-15 NOTE — Progress Notes (Signed)
Arterial stick for lab work per MD

## 2021-09-15 NOTE — ED Notes (Signed)
Labs obtained by RT via arterial stick per DR wickline ok

## 2021-09-15 NOTE — ED Notes (Addendum)
Lab at bedside for 6th attempt at lab draw

## 2021-09-15 NOTE — ED Notes (Signed)
This RN attempted EJ and [place] IV, both of which pt jerked and blew the lines. MD notified. RT called and asked for art stick to obtain labs.

## 2021-09-16 LAB — URINE CULTURE

## 2021-09-19 NOTE — Congregational Nurse Program (Signed)
Attempted to reach out to client for follow up via text message. No answer.  Will await answer to complete this note.  1600 Client responded. She reports her blood sugars have been controlled. Client recent 2 ER visits to [facility] ER and [facility] painful urination: was treated for urinary infection  [facility] in for nausea vomitting.  Client reports she is not having nausea vomiting and is currently able to keep liquids and food down.   Upcoming appointments in November with PCP, Nephrology and Endocrine.  [place] [employer]

## 2021-09-25 ENCOUNTER — Telehealth: Payer: Self-pay

## 2021-09-25 NOTE — Telephone Encounter (Signed)
Pt called in regards to issues with her vision, pt states that she thinks she has blood that's in her eyes as "it looks like someone threw paint in them", pt says due to this she is having trouble seeing, spoke with provider and informed pt to go to ED if she is still experiencing this problem. Pt confirmed she is still having issue and stated she will go to ED

## 2021-10-04 ENCOUNTER — Encounter: Payer: Self-pay | Admitting: "Endocrinology

## 2021-10-04 ENCOUNTER — Ambulatory Visit (INDEPENDENT_AMBULATORY_CARE_PROVIDER_SITE_OTHER): Payer: Self-pay | Admitting: "Endocrinology

## 2021-10-04 ENCOUNTER — Telehealth: Payer: Self-pay | Admitting: "Endocrinology

## 2021-10-04 ENCOUNTER — Encounter: Payer: Self-pay | Admitting: Physician Assistant

## 2021-10-04 ENCOUNTER — Telehealth: Payer: Self-pay | Admitting: Licensed Clinical Social Worker

## 2021-10-04 ENCOUNTER — Ambulatory Visit: Payer: Medicaid Other | Admitting: Physician Assistant

## 2021-10-04 ENCOUNTER — Other Ambulatory Visit: Payer: Self-pay

## 2021-10-04 VITALS — BP 142/103 | HR 78 | Temp 97.9°F | Wt 130.5 lb

## 2021-10-04 VITALS — BP 112/80 | HR 96 | Ht 59.0 in | Wt 132.0 lb

## 2021-10-04 DIAGNOSIS — R300 Dysuria: Secondary | ICD-10-CM

## 2021-10-04 DIAGNOSIS — E1065 Type 1 diabetes mellitus with hyperglycemia: Secondary | ICD-10-CM

## 2021-10-04 DIAGNOSIS — R601 Generalized edema: Secondary | ICD-10-CM

## 2021-10-04 DIAGNOSIS — I10 Essential (primary) hypertension: Secondary | ICD-10-CM

## 2021-10-04 DIAGNOSIS — N189 Chronic kidney disease, unspecified: Secondary | ICD-10-CM

## 2021-10-04 DIAGNOSIS — Z91199 Patient's noncompliance with other medical treatment and regimen due to unspecified reason: Secondary | ICD-10-CM

## 2021-10-04 DIAGNOSIS — H9203 Otalgia, bilateral: Secondary | ICD-10-CM

## 2021-10-04 DIAGNOSIS — Z659 Problem related to unspecified psychosocial circumstances: Secondary | ICD-10-CM

## 2021-10-04 DIAGNOSIS — S90424A Blister (nonthermal), right lesser toe(s), initial encounter: Secondary | ICD-10-CM

## 2021-10-04 LAB — POCT URINALYSIS DIPSTICK
Bilirubin, UA: NEGATIVE
Glucose, UA: POSITIVE — AB
Ketones, UA: NEGATIVE
Leukocytes, UA: NEGATIVE
Nitrite, UA: NEGATIVE
Protein, UA: POSITIVE — AB
Spec Grav, UA: 1.01 (ref 1.010–1.025)
Urobilinogen, UA: 0.2 E.U./dL
pH, UA: 7 (ref 5.0–8.0)

## 2021-10-04 NOTE — Telephone Encounter (Signed)
[place] attempted to reach patient via phone call to schedule counseling services and confirm interest, patient did not answer, voicemail was left.

## 2021-10-04 NOTE — Patient Instructions (Signed)

## 2021-10-04 NOTE — Progress Notes (Signed)
BP (!) 142/103   Pulse 78   Temp 97.9 F (36.6 C)   Wt 130 lb 8 oz (59.2 kg)   SpO2 98%   BMI 26.36 kg/m    Subjective:    Patient ID: Diamond [name], female    DOB: December 24, 1995, 25 y.o.   MRN: [id number]  HPI: Diamond [name] is a 25 y.o. female presenting on 10/04/2021 for Follow-up   HPI   Pt is 25yoF with complex and extensive history including uncontrolled Type 1 DM, CKD, HTN, anasarca,  proteinuria, anemia of chronic disease, discitis, diabetic gastroparesis, suspected eosinophilic esophagitis among others.  She sees endocrinology for the DM (she has appointment later this morning), nephrology for the CKD and anasarca (she was seen 09/08/21), GI for gastroparesis (she has appointment 11/01/21).    She says Her ears hurt bilaterally.  It Started 3 days ago.    She is tired.  She feels unbalanced.  He thinks she has a UTI.  She says it burns.  This has been going for several weeks.   Her Only current meds are lantus and apidra.  She is out of all her meds except the insulin.  She is past due for renewal with [facility] which is why she is out of everything.   She says she has anxiety and depression.    She got a covid booster.  She Injured her toe; she went to ER 09/22/21 for this.  She is not on her menses     Relevant past medical, surgical, family and social history reviewed and updated as indicated. Interim medical history since our last visit reviewed. Allergies and medications reviewed and updated.   CURRENT MEDS: Lantus and apidra  Review of Systems  Per HPI unless specifically indicated above     Objective:    BP (!) 142/103   Pulse 78   Temp 97.9 F (36.6 C)   Wt 130 lb 8 oz (59.2 kg)   SpO2 98%   BMI 26.36 kg/m   Wt Readings from Last 3 Encounters:  10/04/21 130 lb 8 oz (59.2 kg)  09/14/21 144 lb 4 oz (65.4 kg)  09/14/21 144 lb 6.4 oz (65.5 kg)    Physical Exam Vitals reviewed.  Constitutional:      General: She is not in acute  distress.    Appearance: She is well-developed.  HENT:     Head: Normocephalic and atraumatic.     Right Ear: Tympanic membrane, ear canal and external ear normal.     Left Ear: Tympanic membrane, ear canal and external ear normal.  Cardiovascular:     Rate and Rhythm: Normal rate and regular rhythm.  Pulmonary:     Effort: Pulmonary effort is normal.     Breath sounds: Normal breath sounds.  Abdominal:     General: Bowel sounds are normal.     Palpations: Abdomen is soft. There is no mass.     Tenderness: There is generalized abdominal tenderness. There is no guarding or rebound.  Musculoskeletal:     Cervical back: Neck supple.     Right lower leg: Edema present.     Left lower leg: Edema present.  Lymphadenopathy:     Cervical: No cervical adenopathy.  Skin:    General: Skin is warm and dry.  Neurological:     Gait: Gait abnormal.     Comments: Pt sits the head on table or reclined on something with eyes closed throughout most of appointment but she answers  questions when asked.  Psychiatric:     Comments: Pt answers questions appropriately       Urinalysis    Component Value Date/Time   COLORURINE YELLOW 09/14/2021 2323   APPEARANCEUR HAZY (A) 09/14/2021 2323   APPEARANCEUR Clear 11/15/2020 1531   LABSPEC 1.008 09/14/2021 2323   PHURINE 7.0 09/14/2021 2323   GLUCOSEU >=500 (A) 09/14/2021 2323   HGBUR NEGATIVE 09/14/2021 2323   BILIRUBINUR negative 10/04/2021 0932   BILIRUBINUR Negative 11/15/2020 1531   KETONESUR NEGATIVE 09/14/2021 2323   PROTEINUR Positive (A) 10/04/2021 0932   PROTEINUR >=300 (A) 09/14/2021 2323   UROBILINOGEN 0.2 10/04/2021 0932   UROBILINOGEN 0.2 09/13/2012 2014   NITRITE negative 10/04/2021 0932   NITRITE NEGATIVE 09/14/2021 2323   LEUKOCYTESUR Negative 10/04/2021 0932   LEUKOCYTESUR NEGATIVE 09/14/2021 2323              Assessment & Plan:   Encounter Diagnoses  Name Primary?   Primary hypertension Yes   Dysuria     Uncontrolled type 1 diabetes mellitus with hyperglycemia (HCC)    Anasarca    Blister of toe of right foot without infection, initial encounter    Otalgia of both ears    Chronic kidney disease, unspecified CKD stage    Psychosocial problem    Personal history of noncompliance with medical treatment, presenting hazards to health      -DM foot exam was updated -Will have behavioral health counselor call her. -pt is urged to Get [facility] updated so she can get back on her meds -Will send urine for culture -pt to continue with her specialists per their recommendation -pt to follow up here  3 months.  She is to contact office sooner prn

## 2021-10-04 NOTE — Telephone Encounter (Signed)
Patient is stating that since she has no insurance right now the dexcom is over $1,000. Please advise  (her medicaid is still pending)

## 2021-10-04 NOTE — Progress Notes (Signed)
10/04/2021, 1:05 PM  Endocrinology follow-up note   Subjective:    Patient ID: Diamond [name], female    DOB: 10/26/1996.  Diamond [name] is being seen in follow-up after she was seen in consultation for management of currently uncontrolled symptomatic type 1 diabetes.  --  she missed several appointments, came in unaccompanied by her aide who is usually assisting her with her monitoring and insulin administration activities.    Past Medical History:  Diagnosis Date   ADHD (attention deficit hyperactivity disorder) 2007   Allergy    Arm DVT (deep venous thromboembolism), acute, right ([place]) 04/07/2021   Asthma    Bipolar disorder (HCC)    Crohn's colitis ([place])    Dx Dr. Cherrie Gauze   Diabetic nephropathy [facility])    Discitis of lumbar region 05/29/2021   Edema    Eosinophilic esophagitis    GERD (gastroesophageal reflux disease)    Hypertension    MSSA bacteremia 04/07/2021   Nephrotic syndrome    Pancreatitis    Panic attacks    PTSD (post-traumatic stress disorder)    Stroke ([place]) 07/2020   R side   Type 1 diabetes ([place]) 2008    Past Surgical History:  Procedure Laterality Date   ADENOIDECTOMY     BIOPSY  02/01/2021   Procedure: BIOPSY;  Surgeon: Rogene Houston, MD;  [facility];  Service: Endoscopy;;  duodenal, esophageal,   BIOPSY  02/03/2021   Procedure: BIOPSY;  Surgeon: Eloise Harman, DO;  Location: [facility];  Service: Endoscopy;;   COLONOSCOPY WITH PROPOFOL N/A 02/01/2021   Procedure: COLONOSCOPY WITH PROPOFOL;  Surgeon: Rogene Houston, MD;  Location[facility];  Service: Endoscopy;  Laterality: N/A;   COLONOSCOPY WITH PROPOFOL N/A 02/03/2021   Surgeon: Eloise Harman, DO; preparation of the colon was fair after copious lavage, nonbleeding internal hemorrhoids, TI appeared normal s/p biopsy which revealed chronic ileitis with no activity,  granulomata, or malignancy.   ESOPHAGOGASTRODUODENOSCOPY (EGD) WITH PROPOFOL N/A 02/01/2021   Surgeon: Rogene Houston, MD; longitudinally marked mucosal in the esophagus biopsied (chronic esophagitis with increased eosinophils compatible with EOE), normal stomach and duodenum.  Duodenum was biopsied with unremarkable pathology.   HERNIA REPAIR     TEE WITHOUT CARDIOVERSION N/A 03/09/2021   Procedure: TRANSESOPHAGEAL ECHOCARDIOGRAM (TEE) WITH PROPOFOL;  Surgeon: Arnoldo Lenis, MD;  Location[facility];  Service: Endoscopy;  Laterality: N/A;   TONSILLECTOMY      Social History   Socioeconomic History   Marital status: Single    Spouse name: Not on file   Number of children: Not on file   Years of education: Not on file   Highest education level: Not on file  Occupational History   Not on file  Tobacco Use   Smoking status: Never   Smokeless tobacco: Never  Vaping Use   Vaping Use: Never used  Substance and Sexual Activity   Alcohol use: No   Drug use: No   Sexual activity: Not Currently  Other Topics Concern   Not on file  Social History Narrative  Not on file   Social Determinants of Health   Financial Resource Strain: Not on file  Food Insecurity: Not on file  Transportation Needs: Not on file  Physical Activity: Not on file  Stress: Not on file  Social Connections: Not on file    Family History  Problem Relation Age of Onset   Hypertension Mother    Diabetes Mother    Heart disease Mother    Crohn's disease Father    Alcohol abuse Father    Drug abuse Father    Mental illness Father    Cancer Father    Hypertension Maternal Aunt    Seizures Maternal Aunt    Depression Maternal Aunt    Asthma Maternal Uncle    Hypertension Maternal Uncle    Cancer Maternal Grandmother    Diabetes Maternal Grandmother    Mental illness Maternal Grandfather     Outpatient Encounter Medications as of 10/04/2021  Medication Sig   acetaminophen (TYLENOL) 500 MG  tablet Take by mouth.   albuterol (VENTOLIN HFA) 108 (90 Base) MCG/ACT inhaler Inhale 2 puffs into the lungs every 6 (six) hours as needed for wheezing or shortness of breath. For shortness of breath   ciprofloxacin (CIPRO) 500 MG tablet Take 1 tablet (500 mg total) by mouth every 12 (twelve) hours. (Patient not taking: Reported on 10/04/2021)   Continuous Blood Gluc Receiver (DEXCOM G6 RECEIVER) DEVI 1 Piece by Does not apply route as needed. (Patient not taking: Reported on 10/04/2021)   Continuous Blood Gluc Sensor (DEXCOM G6 SENSOR) MISC 4 Pieces by Does not apply route once a week. (Patient not taking: Reported on 10/04/2021)   Continuous Blood Gluc Transmit (DEXCOM G6 TRANSMITTER) MISC 1 Piece by Does not apply route as directed. (Patient not taking: Reported on 10/04/2021)   furosemide (LASIX) 40 MG tablet Take 1 tablet (40 mg total) by mouth 2 (two) times daily. (Patient not taking: Reported on 10/04/2021)   gabapentin (NEURONTIN) 100 MG capsule Take 1 capsule (100 mg total) by mouth 2 (two) times daily. (Patient not taking: Reported on 10/04/2021)   hydrALAZINE (APRESOLINE) 25 MG tablet Take 1 tablet (25 mg total) by mouth 2 (two) times daily. (Patient not taking: Reported on 10/04/2021)   insulin glargine (LANTUS) 100 UNIT/ML injection Inject 0.3 mLs (30 Units total) into the skin at bedtime.   Insulin Glulisine (APIDRA [place]) Inject 10-16 Units into the skin 3 (three) times daily before meals. ssi   isosorbide mononitrate (IMDUR) 30 MG 24 hr tablet Take 0.5 tablets (15 mg total) by mouth daily. (Patient not taking: Reported on 10/04/2021)   metoprolol tartrate (LOPRESSOR) 50 MG tablet Take 1 tablet (50 mg total) by mouth 2 (two) times daily. (Patient not taking: Reported on 10/04/2021)   omeprazole (PRILOSEC) 40 MG capsule Take 1 capsule (40 mg total) by mouth in the morning and at bedtime. (Patient not taking: Reported on 10/04/2021)   ondansetron (ZOFRAN ODT) 8 MG disintegrating tablet Take 1 tablet (8  mg total) by mouth every 8 (eight) hours as needed for nausea or vomiting. (Patient not taking: Reported on 10/04/2021)   [DISCONTINUED] erythromycin ethylsuccinate (EES) 200 MG/5ML suspension Take 3.1 mLs (125 mg total) by mouth 3 (three) times daily before meals. Take 30 minutes before breakfast, lunch, and dinner. (Patient not taking: No sig reported)   No facility-administered encounter medications on file as of 10/04/2021.    ALLERGIES: Allergies  Allergen Reactions   Amoxicillin Hives    Did it involve swelling of  the face/tongue/throat, SOB, or low BP? no  Did it involve sudden or severe rash/hives, skin peeling, or any reaction on the inside of your mouth or nose? no Did you need to seek medical attention at a hospital or doctor's office? no When did it last happen?   unk    If all above answers are "NO", may proceed with cephalosporin use.   Dexamethasone Hives and Itching   Doxycycline Itching and Swelling   Penicillins Hives and Itching    Did it involve swelling of the face/tongue/throat, SOB, or low BP? no  Did it involve sudden or severe rash/hives, skin peeling, or any reaction on the inside of your mouth or nose? no Did you need to seek medical attention at a hospital or doctor's office? no When did it last happen?   unk    If all above answers are "NO", may proceed with cephalosporin use. Did it involve swelling of the face/tongue/throat, SOB, or low BP? no  Did it involve sudden or severe rash/hives, skin peeling, or any reac   Ceftriaxone Sodium In Dextrose Itching   Augmentin [Amoxicillin-Pot Clavulanate] Other (See Comments)    Reaction unknown   Clavulanic Acid    Reglan [Metoclopramide]     Shaking Unclear if Reglan was causing shaking, but medication was discontinued on 10/6 for this reason.    Adhesive [Tape] Itching and Rash   Latex Itching and Rash   Other Itching and Rash   Pineapple Rash    Rash on tongue and throat    VACCINATION  STATUS: Immunization History  Administered Date(s) Administered   HPV Quadrivalent 08/27/2012   Hepatitis A 08/27/2012   Influenza Split 08/27/2012, 11/20/2019   Influenza, Seasonal, Injecte, Preservative Fre 09/05/2019   Meningococcal Conjugate 08/27/2012   Moderna Sars-Covid-2 Vaccination 03/02/2020, 03/29/2020   Pneumococcal Polysaccharide-23 01/26/2021   Tdap 06/18/2008    Diabetes She presents for her follow-up diabetic visit. She has type 1 diabetes mellitus. Onset time: She was diagnosed at approximate age of 76 years. Her disease course has been improving (she has had several ER visits in the interim.). Pertinent negatives for hypoglycemia include no confusion, headaches, pallor or seizures. Associated symptoms include blurred vision, fatigue, foot paresthesias, foot ulcerations, polydipsia and polyuria. Pertinent negatives for diabetes include no chest pain and no polyphagia. There are no hypoglycemic complications. Symptoms are improving. Diabetic complications include a CVA and peripheral neuropathy. (Lack of adequate health insurance.  Noncompliance/nonengagement.) Risk factors for coronary artery disease include diabetes mellitus, family history, sedentary lifestyle and tobacco exposure. Current diabetic treatment includes insulin injections. She is compliant with treatment some of the time. Her weight is fluctuating minimally. She is following a generally unhealthy diet. When asked about meal planning, she reported none. She has not had a previous visit with a dietitian. She never participates in exercise. Her home blood glucose trend is decreasing steadily. Her breakfast blood glucose range is generally 130-140 mg/dl. Her bedtime blood glucose range is generally 180-200 mg/dl. Her overall blood glucose range is 130-140 mg/dl. (Diamond [name] brought only one of her meters showing inadequate monitoring.  Her average blood glucose of 132 for the last 7 days.  Her blood glucose ranges from  95-243.  This is improvement from her previous presentation with severe hyperglycemia with A1c of 13.5%.  She did not go to the pharmacy to pick up her Dexcom. ) An ACE inhibitor/angiotensin II receptor blocker is being taken.    Review of Systems  Constitutional:  Positive for  fatigue. Negative for chills, fever and unexpected weight change.  HENT:  Negative for trouble swallowing and voice change.   Eyes:  Positive for blurred vision. Negative for visual disturbance.  Respiratory:  Negative for cough, shortness of breath and wheezing.   Cardiovascular:  Negative for chest pain, palpitations and leg swelling.  Gastrointestinal:  Positive for nausea and vomiting. Negative for diarrhea.  Endocrine: Positive for polydipsia and polyuria. Negative for cold intolerance, heat intolerance and polyphagia.  Musculoskeletal:  Negative for arthralgias and myalgias.  Skin:  Negative for color change, pallor, rash and wound.  Neurological:  Negative for seizures and headaches.  Psychiatric/Behavioral:  Negative for confusion and suicidal ideas.    Objective:    Vitals with BMI 10/04/2021 10/04/2021 09/15/2021  Height 4' 11"  - -  Weight 132 lbs 130 lbs 8 oz -  BMI 35.36 14.43 -  Systolic 154 008 676  Diastolic 80 195 093  Pulse 96 78 103    BP 112/80   Pulse 96   Ht 4' 11"  (1.499 m)   Wt 132 lb (59.9 kg)   BMI 26.66 kg/m   Wt Readings from Last 3 Encounters:  10/04/21 132 lb (59.9 kg)  10/04/21 130 lb 8 oz (59.2 kg)  09/14/21 144 lb 4 oz (65.4 kg)       CMP ( most recent) CMP     Component Value Date/Time   NA 138 09/15/2021 0126   K 3.6 09/15/2021 0126   CL 107 09/15/2021 0126   CO2 25 09/15/2021 0126   GLUCOSE 316 (H) 09/15/2021 0126   BUN 19 09/15/2021 0126   CREATININE 1.93 (H) 09/15/2021 0126   CREATININE 0.69 07/08/2012 1203   CALCIUM 7.9 (L) 09/15/2021 0126   CALCIUM 9.2 07/13/2021 1416   PROT 5.8 (L) 09/15/2021 0126   ALBUMIN 2.1 (L) 09/15/2021 0126   AST 16  09/15/2021 0126   ALT 12 09/15/2021 0126   ALKPHOS 81 09/15/2021 0126   BILITOT 1.1 09/15/2021 0126   GFRNONAA 36 (L) 09/15/2021 0126   GFRAA >60 08/26/2020 0507    Diabetic Labs (most recent): Lab Results  Component Value Date   HGBA1C 13.5 (H) 08/11/2021   HGBA1C 8.7 (H) 04/01/2021   HGBA1C 10.4 (H) 03/16/2021    Lab Results  Component Value Date   TSH 2.228 01/30/2021   TSH 1.078 08/25/2020   TSH 1.181 07/08/2012      Assessment & Plan:   1. Uncontrolled type 1 diabetes mellitus with hyperglycemia (HCC) - Diamond [name] has currently uncontrolled symptomatic type 1 DM since  25 years of age. Her work-up in the hospital was negative for CHF, or ascites.  She has hypoalbuminemia.  Diamond [name] brought only one of her meters showing inadequate monitoring.  Her average blood glucose of 132 for the last 7 days.  Her blood glucose ranges from 95-243.  This is improvement from her previous presentation with severe hyperglycemia with A1c of 13.5%.  She did not go to the pharmacy to pick up her Dexcom.  - Recent labs reviewed. - I had a long discussion with her and her aide about the progressive nature of diabetes and the pathology behind its complications. -her diabetes is complicated by chronic nonadherence/noncompliance, peripheral neuropathy and she remains at exceedingly  high risk for more acute and chronic complications which include CAD, CVA, CKD, retinopathy, and neuropathy. These are all discussed in detail with her.  - I have counseled her on diet management  by adopting a  carbohydrate restricted/protein rich diet. Patient is encouraged to switch to  unprocessed or minimally processed     complex starch and increased protein intake (animal or plant source), fruits, and vegetables.  -  she is advised to stick to a routine mealtimes to eat 3 meals  a day and avoid unnecessary snacks ( to snack only to correct hypoglycemia).  - she acknowledges that there is a room for  improvement in her food and drink choices. - Suggestion is made for her to avoid simple carbohydrates  from her diet including Cakes, Sweet Desserts, Ice Cream, Soda (diet and regular), Sweet Tea, Candies, Chips, Cookies, Store Bought Juices, Alcohol in Excess of  1-2 drinks a day, Artificial Sweeteners,  Coffee Creamer, and "Sugar-free" Products, Lemonade. This will help patient to have more stable blood glucose profile and potentially avoid unintended weight gain.   - she has been scheduled with Jearld Fenton, RDN, CDE for diabetes education.  She did not keep her follow-up.  - I have reapproached her with the following individualized plan to manage  her diabetes and patient agrees:   - she is not capable of self care, alarmingly non-compliant. She needs assistance either from integrative medicine or placement in a group home/assisted living. - she utilizes ER, Hospital excessively because she can not comprehend with proper monitoring and appropriate utility of insulin. She is at extremely high risk for acute complications of diabetes such as DKA and chronic complications some of which she already has.  -She is advised to go to pharmacy to pick up her Dexcom.  She used this device before, offered assistance if she cannot apply it herself this time.    -In the meantime, she is advised to monitor blood glucose at least 4 times a day-before meals and at bedtime, continue Lantus 30 units nightly,  continue Apidra 10  units 3 times a day with meals  for pre-meal BG readings of 90-158m/dl, plus patient specific correction dose for unexpected hyperglycemia above 1564mdl, associated with strict monitoring of glucose 4 times a day-before meals and at bedtime. - she is warned not to take insulin without proper monitoring per orders. - Adjustment parameters are given to her for hypo and hyperglycemia in writing. - she is encouraged to call clinic for blood glucose levels less than 70 or above 200 mg  /dl.    - she is not a candidate for metformin, SGLT2 inhibitors, nor incretin therapy.   - Specific targets for  A1c;  LDL, HDL,  and Triglycerides were discussed with the patient.  2) Blood Pressure /Hypertension: Her blood pressure is controlled to target.  She is advised to continue hydralazine 25 mg p.o. twice daily, metoprolol 50 mg p.o. twice daily.    3) Lipids/Hyperlipidemia: She does not have any lipid panel to review.     She will be considered for fasting lipid panel on subsequent visits.   4)  Weight/Diet:  Body mass index is 26.66 kg/m.  -She is not a candidate for weight loss.  Exercise, and detailed carbohydrates information provided  -  detailed on discharge instructions.  5) Chronic Care/Health Maintenance:  -she  is not  on ACEI/ARB and Statin medications and  is encouraged to initiate and continue to follow up with Ophthalmology, Dentist,  Podiatrist at least yearly or according to recommendations, and advised to   stay away from smoking. I have recommended yearly flu vaccine and pneumonia vaccine at least every 5 years;  and  sleep for at least  7 hours a day.  Patient needs a lot of social work due to that she does not realize how high risk she is for diabetes complications.   - she is  advised to maintain close follow up with her PCP for primary care needs, as well as her other providers for optimal and coordinated care.    I spent 31 minutes in the care of the patient today including review of labs from [place], Lipids, Thyroid Function, Hematology (current and previous including abstractions from other facilities); face-to-face time discussing  her blood glucose readings/logs, discussing hypoglycemia and hyperglycemia episodes and symptoms, medications doses, her options of short and long term treatment based on the latest standards of care / guidelines;  discussion about incorporating lifestyle medicine;  and documenting the encounter.    Please refer to Patient  Instructions for Blood Glucose Monitoring and Insulin/Medications Dosing Guide"  in media tab for additional information. Please  also refer to " Patient Self Inventory" in the Media  tab for reviewed elements of pertinent patient history.  [name] participated in the discussions, expressed understanding, and voiced agreement with the above plans.  All questions were answered to her satisfaction. she is encouraged to contact clinic should she have any questions or concerns prior to her return visit.    Follow up plan: - Return in about 9 weeks (around 12/06/2021) for Bring Meter and Logs- A1c in Office.  Glade Lloyd, MD [facility] [address] [place], [place] [address] Phone: [phone]  Fax: [phone]    10/04/2021, 1:05 PM  This note was partially dictated with voice recognition software. Similar sounding words can be transcribed inadequately or may not  be corrected upon review.

## 2021-10-05 ENCOUNTER — Other Ambulatory Visit: Payer: Self-pay | Admitting: Physician Assistant

## 2021-10-05 ENCOUNTER — Telehealth: Payer: Self-pay

## 2021-10-05 LAB — URINE CULTURE

## 2021-10-05 NOTE — Telephone Encounter (Signed)
Discussed with pt, understanding voiced. 

## 2021-10-05 NOTE — Telephone Encounter (Signed)
called for follow up for DM, client had Endocrine and [facility]  appts today. Endocrine wants her to use a dexcom. client is low income and no insurance and she states it is 500$ she states she has called his office for guidance. Discussed her [facility] and that she currently only has her insulins. she needs her taxes. she has them, but working all week, Plan to go by 10/05/21 to her job, get her taxes and take her testing supplies and log sheets. Discussed with client that she has to work on eating at regular meal times, checking her blood sugars and taking insulin only if she is going to eat. and to eat healthy, watching processed foods, high sugars and carbs. no sugary drinks and keeping her logs for her providers. client reports understanding. client seems to have flat affect, no emotion on phone. will continue to follow frequently and remind client to use her primary care for any medical issues for direction and to keep logs for Endocrine [name]. Client lives with Elenor Legato and Barbaraann Rondo and other relatives. she works at [employer] in [place]. she utilizes transportation with assistance of [employer]. She is no longer working with [employer]. Plan to re-address getting mental health services either at [facility] with [facility] or [facility] and have SW interns check in with her often. Plan also to contact [facility] clinic provider tomorrow morning for a temporary send of medications she prescribes to local pharmacy for assistance until medassist cleared.   [place] [employer]

## 2021-10-05 NOTE — Telephone Encounter (Signed)
Left a message requesting a return call to the office.

## 2021-10-10 ENCOUNTER — Telehealth: Payer: Self-pay

## 2021-10-10 NOTE — Telephone Encounter (Signed)
Call from [place] with Dr Toya Smothers office requesting provider info regarding medications, info given

## 2021-10-11 ENCOUNTER — Telehealth: Payer: Self-pay | Admitting: Licensed Clinical Social Worker

## 2021-10-11 NOTE — Telephone Encounter (Signed)
[facility] left patient second voicemail inquiring if patient was interested in counseling services.

## 2021-10-12 ENCOUNTER — Telehealth: Payer: Self-pay

## 2021-10-12 NOTE — Telephone Encounter (Signed)
Spoke with pt & informed insulin delivered, pt stated she will be in next week to pick up.

## 2021-10-16 ENCOUNTER — Telehealth: Payer: Self-pay | Admitting: Physician Assistant

## 2021-10-16 ENCOUNTER — Ambulatory Visit: Payer: Medicaid Other | Admitting: Physician Assistant

## 2021-10-16 NOTE — Telephone Encounter (Signed)
Pt called stating she had sores on her foot and the foot was starting to turn black.  Pt was offered appointment to be seen today.  Pt did not want to come into the office today and asked about Thursday.  Pt was told that this could be a significant problem and she needs to be seen in person as soon as possible.  She says she will check to see if she can get a ride and call back.

## 2021-10-19 ENCOUNTER — Ambulatory Visit: Payer: Medicaid Other | Admitting: Physician Assistant

## 2021-10-19 ENCOUNTER — Encounter: Payer: Self-pay | Admitting: Physician Assistant

## 2021-10-19 ENCOUNTER — Other Ambulatory Visit: Payer: Self-pay

## 2021-10-19 VITALS — BP 171/121 | HR 105 | Temp 97.2°F | Wt 138.0 lb

## 2021-10-19 DIAGNOSIS — S90424D Blister (nonthermal), right lesser toe(s), subsequent encounter: Secondary | ICD-10-CM

## 2021-10-19 DIAGNOSIS — E1065 Type 1 diabetes mellitus with hyperglycemia: Secondary | ICD-10-CM

## 2021-10-19 DIAGNOSIS — N189 Chronic kidney disease, unspecified: Secondary | ICD-10-CM

## 2021-10-19 DIAGNOSIS — Z659 Problem related to unspecified psychosocial circumstances: Secondary | ICD-10-CM

## 2021-10-19 DIAGNOSIS — R601 Generalized edema: Secondary | ICD-10-CM

## 2021-10-19 MED ORDER — CARVEDILOL 6.25 MG PO TABS: 6.2500 mg | ORAL_TABLET | Freq: Two times a day (BID) | ORAL | 0 refills | Status: AC

## 2021-10-19 MED ORDER — HYDRALAZINE HCL 25 MG PO TABS: 25.0000 mg | ORAL_TABLET | Freq: Two times a day (BID) | ORAL | 0 refills | Status: AC

## 2021-10-19 MED ORDER — OMEPRAZOLE 40 MG PO CPDR: 40.0000 mg | DELAYED_RELEASE_CAPSULE | Freq: Two times a day (BID) | ORAL | 0 refills | Status: AC

## 2021-10-19 MED ORDER — FUROSEMIDE 40 MG PO TABS: 40.0000 mg | ORAL_TABLET | Freq: Every day | ORAL | 0 refills | Status: AC

## 2021-10-19 MED ORDER — ALBUTEROL SULFATE HFA 108 (90 BASE) MCG/ACT IN AERS: 2.0000 | INHALATION_SPRAY | Freq: Four times a day (QID) | RESPIRATORY_TRACT | 0 refills | Status: AC | PRN

## 2021-10-19 NOTE — Progress Notes (Signed)
BP (!) 171/121   Pulse (!) 105   Temp (!) 97.2 F (36.2 C)   Wt 138 lb (62.6 kg)   SpO2 97%   BMI 27.87 kg/m    Subjective:    Patient ID: Diamond [name], female    DOB: 22-May-1996, 25 y.o.   MRN: [id number]  HPI: Diamond [name] is a 25 y.o. female presenting on 10/19/2021 for Foot Injury (Sores on left foot)   HPI   Chief Complaint  Patient presents with   Foot Injury    Sores on left foot    Pt called office Monday with reporting black spots on foot and was encouraged to come in on that day but is just in today to be seen.  She says her blood sugars are okay.   Pt is taking no meds at all except for her insulin because she has not contacted her nephrologist as instructed to get rx sent in.  The nephrologist has prescribed most of her meds as there is a fine point between her anasarca and her CKD.    Relevant past medical, surgical, family and social history reviewed and updated as indicated. Interim medical history since our last visit reviewed. Allergies and medications reviewed and updated.  CURRENT MEDS: Lantus apidra  Review of Systems  Per HPI unless specifically indicated above     Objective:    BP (!) 171/121   Pulse (!) 105   Temp (!) 97.2 F (36.2 C)   Wt 138 lb (62.6 kg)   SpO2 97%   BMI 27.87 kg/m   Wt Readings from Last 3 Encounters:  10/19/21 138 lb (62.6 kg)  10/04/21 132 lb (59.9 kg)  10/04/21 130 lb 8 oz (59.2 kg)    Physical Exam Constitutional:      General: She is not in acute distress.    Appearance: She is not toxic-appearing.  HENT:     Head: Normocephalic and atraumatic.  Cardiovascular:     Rate and Rhythm: Normal rate and regular rhythm.  Pulmonary:     Effort: Pulmonary effort is normal. No respiratory distress.     Breath sounds: Normal breath sounds. No wheezing or rhonchi.  Musculoskeletal:     Right lower leg: Edema present.     Left lower leg: Edema present.  Feet:     Comments: There is a healing blister  right 2nd toe, lateral side next to great toe which is without erythema or purulence and appears to be healing appropriately.  Distal end of fourth toe has area about 83m of thickened skin with no redness or signs necrosis and no erythema or purulence.   Neurological:     Mental Status: She is oriented to person, place, and time.  Psychiatric:        Speech: Speech normal.        Behavior: Behavior is cooperative.     Comments: Apathetic affect          Assessment & Plan:   Encounter Diagnoses  Name Primary?   Uncontrolled type 1 diabetes mellitus with hyperglycemia (HCC) Yes   Blister of toe of right foot without infection, subsequent encounter    Anasarca    Chronic kidney disease, unspecified CKD stage    Psychosocial problem      -pt is educated that these 2 spots on her right foot appear to be healing without complication.  Encouraged her to watch to redness, pus, or black skin.   -discussed with pt the  importance of her taking her medications.  Rx sent -she is to RTO right away of any signs of problems with her feet or other issue.  Otherwise she will follow up as scheduled

## 2021-10-30 ENCOUNTER — Other Ambulatory Visit: Payer: Self-pay | Admitting: Physician Assistant

## 2021-10-30 ENCOUNTER — Telehealth: Payer: Self-pay | Admitting: Physician Assistant

## 2021-10-30 ENCOUNTER — Telehealth: Payer: Self-pay

## 2021-10-30 DIAGNOSIS — E1065 Type 1 diabetes mellitus with hyperglycemia: Secondary | ICD-10-CM

## 2021-10-30 NOTE — Telephone Encounter (Signed)
Patient called in stating she had not had a period in 2 years and started one on Thanksgiving Day. She stated she is having burning and pain with and without urination. She stated her ovaries were hurting also. After speaking with PA the patient was advised to make an appointment. Patient was offered an appointment for tomorrow 10/31/21 but patient declined due to not having transportation and also having to work. Patient then requested an appointment for 11/06/2021. Patient was advised if she waited a week to come in for an appointment and it was infection that it was only going to get worse . She was also told that infection will increase blood sugars since she is a diabetic. Patient was given an appointment on 11/06/2021.

## 2021-10-30 NOTE — Telephone Encounter (Addendum)
Client notified RN that she needs transportation for tomorrow 10/31/21 to [facility]. Reports thanksgiving she had bleeding after having no menstrual cycle for an extended period of time, also complained that she has burning with and without urination and pain in her vaginal area and left "ovarian area" she states bleeding has stopped, but she is still having pain and burning.  Discussed with client new rules with transportation that require 48 hour business day notice now and we can not schedule more than 2 weeks ahead now. Asked client to call [facility] and ask if she can be seen for 11/01/21 which will be 48 hours. Ultimately appt secured for 11/01/21 at 0830 am and transportation arranged.   Client has been scheduling her future appointments and again reminded client of new guidelines to schedule. She states understanding.   Previous conversations today also: Client reported via text she has not received her medications from [facility]. Per [facility] provider sent in refills for medications on 10/19/21 and were confirmed received by [facility]. Reviewed with client which medications were sent by provider which totalled 5. Suggested client call [facility] to make sure they are sending them and when they are to arrive. Client states she would have to call Dr Theador Hawthorne for others. Discussed that Dr Theador Hawthorne is not a [facility] provider therefore I have very limited information from those visits and emphasized that medications he prescribes to call his office to request refills. She states understanding. Again reviewed with client which medications were e-prescribed by [facility] provider on 10/19/21 and reminded client that [facility] does not auto refill and needs to be called before running out of medications to have prescriptions refilled.  Client also was inquiring about an eye exam. This RN talked with provider at [facility] regarding a diabetic eye referral. And referral made by provider for  eye exam.  Client notified of this, she will be contacted with her appointment time and location.   Update on client: she shares today she is now working at [employer] as a Scientist, water quality. She began working at [employer] ,2 weeks ago. Reminded client to continue to eat on a schedule 3 times daily, to take insulin as prescribed by Dr. Dorris Fetch, to check her blood sugars and keep logs. Client acknowledged she understands, with "yes Ma'am".   Will continue to follow.  [place] [employer]

## 2021-11-01 ENCOUNTER — Telehealth: Payer: Medicaid Other | Admitting: Internal Medicine

## 2021-11-01 ENCOUNTER — Other Ambulatory Visit: Payer: Self-pay

## 2021-11-01 ENCOUNTER — Ambulatory Visit: Payer: Medicaid Other | Admitting: Physician Assistant

## 2021-11-01 ENCOUNTER — Encounter: Payer: Self-pay | Admitting: Physician Assistant

## 2021-11-01 VITALS — BP 132/99 | Temp 97.8°F | Wt 140.0 lb

## 2021-11-01 DIAGNOSIS — R300 Dysuria: Secondary | ICD-10-CM

## 2021-11-01 DIAGNOSIS — Z659 Problem related to unspecified psychosocial circumstances: Secondary | ICD-10-CM

## 2021-11-01 DIAGNOSIS — J029 Acute pharyngitis, unspecified: Secondary | ICD-10-CM

## 2021-11-01 DIAGNOSIS — N189 Chronic kidney disease, unspecified: Secondary | ICD-10-CM

## 2021-11-01 DIAGNOSIS — E1065 Type 1 diabetes mellitus with hyperglycemia: Secondary | ICD-10-CM

## 2021-11-01 DIAGNOSIS — Z20822 Contact with and (suspected) exposure to covid-19: Secondary | ICD-10-CM

## 2021-11-01 DIAGNOSIS — R051 Acute cough: Secondary | ICD-10-CM

## 2021-11-01 LAB — POCT URINALYSIS DIPSTICK
Bilirubin, UA: NEGATIVE
Glucose, UA: POSITIVE — AB
Ketones, UA: NEGATIVE
Leukocytes, UA: NEGATIVE
Nitrite, UA: NEGATIVE
Protein, UA: POSITIVE — AB
Spec Grav, UA: 1.01 (ref 1.010–1.025)
Urobilinogen, UA: 0.2 E.U./dL
pH, UA: 6.5 (ref 5.0–8.0)

## 2021-11-01 NOTE — Progress Notes (Signed)
BP (!) 132/99   Temp 97.8 F (36.6 C)   Wt 140 lb (63.5 kg)   BMI 28.28 kg/m    Subjective:    Patient ID: Diamond [name], female    DOB: 06-06-1996, 25 y.o.   MRN: [id number]  HPI: Diamond [name] is a 25 y.o. female presenting on 11/01/2021 for Vaginal Pain and Sore Throat (Sore throat last 2-3 days.)   HPI  Pt appointment for vaginal and urinary symptoms.  Pt is seen to be coughing and have hoarse voice.  She says she has had Sore throat and cough  and hoarse voice for 2-3 days.  She says she does not feel well.  She has not taken a covid test in a long time.  Pt complains of Vaginal and ovary pain.  She says she has had No sex in several years.  She says she just finished what she calls menses.   She says that was the first period she had in 2 years.  She says she had PAP in [place] at gynecologist.  She says she had [place] also.  She says they won't give her results because she owes money.    She is scheduled to work today (at [employer]).  She is scheduled with GI today to be seen at 11am also which she says she didn't know about.       Relevant past medical, surgical, family and social history reviewed and updated as indicated. Interim medical history since our last visit reviewed. Allergies and medications reviewed and updated.   Current Outpatient Medications:    insulin glargine (LANTUS) 100 UNIT/ML injection, Inject 0.3 mLs (30 Units total) into the skin at bedtime., Disp: 10 mL, Rfl: 1   Insulin Glulisine (APIDRA [place]), Inject 10-16 Units into the skin 3 (three) times daily before meals. ssi, Disp: , Rfl:    albuterol (VENTOLIN HFA) 108 (90 Base) MCG/ACT inhaler, Inhale 2 puffs into the lungs every 6 (six) hours as needed for wheezing or shortness of breath. For shortness of breath (Patient not taking: Reported on 11/01/2021), Disp: 3 each, Rfl: 0   carvedilol (COREG) 6.25 MG tablet, Take 1 tablet (6.25 mg total) by mouth 2 (two) times daily with a meal. (Patient not  taking: Reported on 11/01/2021), Disp: 180 tablet, Rfl: 0   furosemide (LASIX) 40 MG tablet, Take 1 tablet (40 mg total) by mouth daily. (Patient not taking: Reported on 11/01/2021), Disp: 90 tablet, Rfl: 0   hydrALAZINE (APRESOLINE) 25 MG tablet, Take 1 tablet (25 mg total) by mouth 2 (two) times daily. (Patient not taking: Reported on 11/01/2021), Disp: 90 tablet, Rfl: 0   isosorbide mononitrate (IMDUR) 30 MG 24 hr tablet, Take 0.5 tablets (15 mg total) by mouth daily. (Patient not taking: Reported on 10/04/2021), Disp: , Rfl:    omeprazole (PRILOSEC) 40 MG capsule, Take 1 capsule (40 mg total) by mouth in the morning and at bedtime. (Patient not taking: Reported on 11/01/2021), Disp: 90 capsule, Rfl: 0   ondansetron (ZOFRAN ODT) 8 MG disintegrating tablet, Take 1 tablet (8 mg total) by mouth every 8 (eight) hours as needed for nausea or vomiting. (Patient not taking: Reported on 10/04/2021), Disp: 20 tablet, Rfl: 2    Review of Systems  Per HPI unless specifically indicated above     Objective:    BP (!) 132/99   Temp 97.8 F (36.6 C)   Wt 140 lb (63.5 kg)   BMI 28.28 kg/m   Wt Readings  from Last 3 Encounters:  11/01/21 140 lb (63.5 kg)  10/19/21 138 lb (62.6 kg)  10/04/21 132 lb (59.9 kg)    Physical Exam Constitutional:      General: She is not in acute distress.    Appearance: She is not toxic-appearing.  HENT:     Head: Normocephalic and atraumatic.     Mouth/Throat:     Comments: Voice hoarse Cardiovascular:     Rate and Rhythm: Normal rate and regular rhythm.  Pulmonary:     Effort: No respiratory distress.     Breath sounds: No wheezing or rhonchi.     Comments: Pt with frequent cough, not intactable Abdominal:     General: Bowel sounds are normal.     Palpations: There is no mass.     Tenderness: There is abdominal tenderness in the right lower quadrant, suprapubic area and left lower quadrant. There is no guarding or rebound.  Musculoskeletal:     Right lower  leg: Edema present.     Left lower leg: Edema present.  Neurological:     Mental Status: She is oriented to person, place, and time.  Psychiatric:        Mood and Affect: Affect is inappropriate.        Behavior: Behavior is cooperative.     Comments: Pt is her usual uniterested inattentive self.  She lays her head down and closes her eyes during history taking.   This is not abnormal for her as she more often than not behaves this way.     Results for orders placed or performed in visit on 11/01/21  POCT Urinalysis Dipstick  Result Value Ref Range   Color, UA yellow    Clarity, UA     Glucose, UA Positive (A) Negative   Bilirubin, UA negative    Ketones, UA negative    Spec Grav, UA 1.010 1.010 - 1.025   Blood, UA trace-intact    pH, UA 6.5 5.0 - 8.0   Protein, UA Positive (A) Negative   Urobilinogen, UA 0.2 0.2 or 1.0 E.U./dL   Nitrite, UA negative    Leukocytes, UA Negative Negative   Appearance     Odor        Assessment & Plan:    Encounter Diagnoses  Name Primary?   Dysuria Yes   Suspected COVID-19 virus infection    Acute cough    Sore throat    Uncontrolled type 1 diabetes mellitus with hyperglycemia (HCC)    Chronic kidney disease, unspecified CKD stage    Psychosocial problem      She is told she needs to go home and take a covid test because she is sick.  She was given a test for this purpose.   She is urged to not go to work to spread her illness.  Discussed that she should go home, take the test, get some rest.   She can use warm salt water gargles for ST.    GI office was notified that pt is sick  Record request sent to [facility] in [place] for records.

## 2021-11-06 ENCOUNTER — Ambulatory Visit: Payer: Medicaid Other | Admitting: Physician Assistant

## 2021-11-16 ENCOUNTER — Telehealth: Payer: Self-pay

## 2021-11-20 ENCOUNTER — Encounter (HOSPITAL_COMMUNITY): Payer: Self-pay | Admitting: Physical Therapy

## 2021-11-20 NOTE — Therapy (Signed)
[facility] [place], [place], [address] Phone: [phone]   Fax:  [phone]  Patient Details  Name: Diamond [name] MRN: [id number] Date of Birth: [birth year] Referring Provider:  No ref. provider found  Encounter Date: 11/20/2021  PHYSICAL THERAPY DISCHARGE SUMMARY  Visits from Start of Care: 1  Current functional level related to goals / functional outcomes: Unknown as the patient has not returned.   Remaining deficits: Unknown   Education / Equipment: HEP  Patient agrees to discharge. Patient goals were not met. Patient is being discharged due to not returning since the last visit.   9:50 AM, 11/20/21 Mearl Latin PT, DPT Physical Therapist at [facility] [address] [place], [place], [address] Phone: [phone]   Fax:  [phone]

## 2021-11-22 ENCOUNTER — Inpatient Hospital Stay (HOSPITAL_COMMUNITY): Payer: Medicaid Other

## 2021-11-22 ENCOUNTER — Emergency Department (HOSPITAL_COMMUNITY): Payer: Medicaid Other

## 2021-11-22 ENCOUNTER — Inpatient Hospital Stay (HOSPITAL_COMMUNITY)
Admission: EM | Admit: 2021-11-22 | Discharge: 2021-12-03 | DRG: 638 | Disposition: E | Payer: Medicaid Other | Attending: Family Medicine | Admitting: Family Medicine

## 2021-11-22 ENCOUNTER — Encounter (HOSPITAL_COMMUNITY): Payer: Self-pay

## 2021-11-22 ENCOUNTER — Other Ambulatory Visit: Payer: Self-pay

## 2021-11-22 DIAGNOSIS — R0902 Hypoxemia: Secondary | ICD-10-CM | POA: Diagnosis not present

## 2021-11-22 DIAGNOSIS — I129 Hypertensive chronic kidney disease with stage 1 through stage 4 chronic kidney disease, or unspecified chronic kidney disease: Secondary | ICD-10-CM | POA: Diagnosis present

## 2021-11-22 DIAGNOSIS — Z8673 Personal history of transient ischemic attack (TIA), and cerebral infarction without residual deficits: Secondary | ICD-10-CM

## 2021-11-22 DIAGNOSIS — N049 Nephrotic syndrome with unspecified morphologic changes: Secondary | ICD-10-CM | POA: Diagnosis present

## 2021-11-22 DIAGNOSIS — E1069 Type 1 diabetes mellitus with other specified complication: Secondary | ICD-10-CM | POA: Diagnosis present

## 2021-11-22 DIAGNOSIS — E1100 Type 2 diabetes mellitus with hyperosmolarity without nonketotic hyperglycemic-hyperosmolar coma (NKHHC): Secondary | ICD-10-CM

## 2021-11-22 DIAGNOSIS — N179 Acute kidney failure, unspecified: Secondary | ICD-10-CM | POA: Diagnosis present

## 2021-11-22 DIAGNOSIS — Z8719 Personal history of other diseases of the digestive system: Secondary | ICD-10-CM

## 2021-11-22 DIAGNOSIS — R509 Fever, unspecified: Secondary | ICD-10-CM | POA: Diagnosis present

## 2021-11-22 DIAGNOSIS — F909 Attention-deficit hyperactivity disorder, unspecified type: Secondary | ICD-10-CM | POA: Diagnosis present

## 2021-11-22 DIAGNOSIS — Z86718 Personal history of other venous thrombosis and embolism: Secondary | ICD-10-CM | POA: Diagnosis not present

## 2021-11-22 DIAGNOSIS — E8809 Other disorders of plasma-protein metabolism, not elsewhere classified: Secondary | ICD-10-CM | POA: Diagnosis present

## 2021-11-22 DIAGNOSIS — N183 Chronic kidney disease, stage 3 unspecified: Secondary | ICD-10-CM | POA: Diagnosis present

## 2021-11-22 DIAGNOSIS — E8779 Other fluid overload: Secondary | ICD-10-CM | POA: Diagnosis present

## 2021-11-22 DIAGNOSIS — D631 Anemia in chronic kidney disease: Secondary | ICD-10-CM | POA: Diagnosis present

## 2021-11-22 DIAGNOSIS — H5462 Unqualified visual loss, left eye, normal vision right eye: Secondary | ICD-10-CM | POA: Diagnosis present

## 2021-11-22 DIAGNOSIS — Z794 Long term (current) use of insulin: Secondary | ICD-10-CM

## 2021-11-22 DIAGNOSIS — E1065 Type 1 diabetes mellitus with hyperglycemia: Secondary | ICD-10-CM | POA: Diagnosis present

## 2021-11-22 DIAGNOSIS — M25551 Pain in right hip: Secondary | ICD-10-CM | POA: Diagnosis present

## 2021-11-22 DIAGNOSIS — E1022 Type 1 diabetes mellitus with diabetic chronic kidney disease: Secondary | ICD-10-CM | POA: Diagnosis present

## 2021-11-22 DIAGNOSIS — D509 Iron deficiency anemia, unspecified: Secondary | ICD-10-CM | POA: Diagnosis present

## 2021-11-22 DIAGNOSIS — L89891 Pressure ulcer of other site, stage 1: Secondary | ICD-10-CM | POA: Diagnosis present

## 2021-11-22 DIAGNOSIS — Z9181 History of falling: Secondary | ICD-10-CM

## 2021-11-22 DIAGNOSIS — E1021 Type 1 diabetes mellitus with diabetic nephropathy: Secondary | ICD-10-CM | POA: Diagnosis present

## 2021-11-22 DIAGNOSIS — L8990 Pressure ulcer of unspecified site, unspecified stage: Secondary | ICD-10-CM | POA: Diagnosis present

## 2021-11-22 DIAGNOSIS — Z833 Family history of diabetes mellitus: Secondary | ICD-10-CM

## 2021-11-22 DIAGNOSIS — Z20822 Contact with and (suspected) exposure to covid-19: Secondary | ICD-10-CM | POA: Diagnosis present

## 2021-11-22 DIAGNOSIS — R0602 Shortness of breath: Secondary | ICD-10-CM

## 2021-11-22 DIAGNOSIS — E669 Obesity, unspecified: Secondary | ICD-10-CM | POA: Diagnosis present

## 2021-11-22 DIAGNOSIS — E1043 Type 1 diabetes mellitus with diabetic autonomic (poly)neuropathy: Secondary | ICD-10-CM | POA: Diagnosis present

## 2021-11-22 DIAGNOSIS — Z91040 Latex allergy status: Secondary | ICD-10-CM

## 2021-11-22 DIAGNOSIS — R601 Generalized edema: Secondary | ICD-10-CM | POA: Diagnosis present

## 2021-11-22 DIAGNOSIS — K219 Gastro-esophageal reflux disease without esophagitis: Secondary | ICD-10-CM | POA: Diagnosis present

## 2021-11-22 DIAGNOSIS — K3184 Gastroparesis: Secondary | ICD-10-CM | POA: Diagnosis present

## 2021-11-22 DIAGNOSIS — Z825 Family history of asthma and other chronic lower respiratory diseases: Secondary | ICD-10-CM

## 2021-11-22 DIAGNOSIS — Z79899 Other long term (current) drug therapy: Secondary | ICD-10-CM

## 2021-11-22 DIAGNOSIS — E870 Hyperosmolality and hypernatremia: Secondary | ICD-10-CM | POA: Diagnosis present

## 2021-11-22 DIAGNOSIS — Z888 Allergy status to other drugs, medicaments and biological substances status: Secondary | ICD-10-CM

## 2021-11-22 DIAGNOSIS — I10 Essential (primary) hypertension: Secondary | ICD-10-CM | POA: Diagnosis present

## 2021-11-22 DIAGNOSIS — N1831 Chronic kidney disease, stage 3a: Secondary | ICD-10-CM | POA: Diagnosis present

## 2021-11-22 DIAGNOSIS — E1121 Type 2 diabetes mellitus with diabetic nephropathy: Secondary | ICD-10-CM | POA: Diagnosis present

## 2021-11-22 DIAGNOSIS — Z8249 Family history of ischemic heart disease and other diseases of the circulatory system: Secondary | ICD-10-CM

## 2021-11-22 DIAGNOSIS — E1010 Type 1 diabetes mellitus with ketoacidosis without coma: Secondary | ICD-10-CM | POA: Diagnosis present

## 2021-11-22 DIAGNOSIS — Z881 Allergy status to other antibiotic agents status: Secondary | ICD-10-CM

## 2021-11-22 DIAGNOSIS — R52 Pain, unspecified: Secondary | ICD-10-CM

## 2021-11-22 DIAGNOSIS — Z880 Allergy status to penicillin: Secondary | ICD-10-CM

## 2021-11-22 DIAGNOSIS — Z6834 Body mass index (BMI) 34.0-34.9, adult: Secondary | ICD-10-CM

## 2021-11-22 LAB — CBC WITH DIFFERENTIAL/PLATELET
Abs Immature Granulocytes: 0.03 10*3/uL (ref 0.00–0.07)
Basophils Absolute: 0.1 10*3/uL (ref 0.0–0.1)
Basophils Relative: 1 %
Eosinophils Absolute: 0.4 10*3/uL (ref 0.0–0.5)
Eosinophils Relative: 5 %
HCT: 34 % — ABNORMAL LOW (ref 36.0–46.0)
Hemoglobin: 10.6 g/dL — ABNORMAL LOW (ref 12.0–15.0)
Immature Granulocytes: 0 %
Lymphocytes Relative: 18 %
Lymphs Abs: 1.5 10*3/uL (ref 0.7–4.0)
MCH: 29.7 pg (ref 26.0–34.0)
MCHC: 31.2 g/dL (ref 30.0–36.0)
MCV: 95.2 fL (ref 80.0–100.0)
Monocytes Absolute: 0.6 10*3/uL (ref 0.1–1.0)
Monocytes Relative: 7 %
Neutro Abs: 5.8 10*3/uL (ref 1.7–7.7)
Neutrophils Relative %: 69 %
Platelets: 274 10*3/uL (ref 150–400)
RBC: 3.57 MIL/uL — ABNORMAL LOW (ref 3.87–5.11)
RDW: 13.3 % (ref 11.5–15.5)
WBC: 8.3 10*3/uL (ref 4.0–10.5)
nRBC: 0 % (ref 0.0–0.2)

## 2021-11-22 LAB — RESP PANEL BY RT-PCR (FLU A&B, COVID) ARPGX2
Influenza A by PCR: NEGATIVE
Influenza B by PCR: NEGATIVE
SARS Coronavirus 2 by RT PCR: NEGATIVE

## 2021-11-22 LAB — BLOOD GAS, VENOUS
Acid-Base Excess: 1.6 mmol/L (ref 0.0–2.0)
Bicarbonate: 24.8 mmol/L (ref 20.0–28.0)
FIO2: 21
O2 Saturation: 78.4 %
Patient temperature: 36.6
pCO2, Ven: 54.3 mmHg (ref 44.0–60.0)
pH, Ven: 7.317 (ref 7.250–7.430)
pO2, Ven: 46 mmHg — ABNORMAL HIGH (ref 32.0–45.0)

## 2021-11-22 LAB — TROPONIN I (HIGH SENSITIVITY): Troponin I (High Sensitivity): 16 ng/L (ref <18)

## 2021-11-22 LAB — BASIC METABOLIC PANEL
Anion gap: 6 (ref 5–15)
BUN: 23 mg/dL — ABNORMAL HIGH (ref 6–20)
CO2: 25 mmol/L (ref 22–32)
Calcium: 7.9 mg/dL — ABNORMAL LOW (ref 8.9–10.3)
Chloride: 103 mmol/L (ref 98–111)
Creatinine, Ser: 1.34 mg/dL — ABNORMAL HIGH (ref 0.44–1.00)
GFR, Estimated: 56 mL/min — ABNORMAL LOW (ref >60)
Glucose, Bld: 348 mg/dL — ABNORMAL HIGH (ref 70–99)
Potassium: 3.8 mmol/L (ref 3.5–5.1)
Sodium: 134 mmol/L — ABNORMAL LOW (ref 135–145)

## 2021-11-22 LAB — COMPREHENSIVE METABOLIC PANEL
ALT: 19 U/L (ref 0–44)
AST: 16 U/L (ref 15–41)
Albumin: 1.8 g/dL — ABNORMAL LOW (ref 3.5–5.0)
Alkaline Phosphatase: 124 U/L (ref 38–126)
Anion gap: 7 (ref 5–15)
BUN: 22 mg/dL — ABNORMAL HIGH (ref 6–20)
CO2: 24 mmol/L (ref 22–32)
Calcium: 8 mg/dL — ABNORMAL LOW (ref 8.9–10.3)
Chloride: 100 mmol/L (ref 98–111)
Creatinine, Ser: 1.37 mg/dL — ABNORMAL HIGH (ref 0.44–1.00)
GFR, Estimated: 55 mL/min — ABNORMAL LOW (ref >60)
Glucose, Bld: 564 mg/dL (ref 70–99)
Potassium: 4.7 mmol/L (ref 3.5–5.1)
Sodium: 131 mmol/L — ABNORMAL LOW (ref 135–145)
Total Bilirubin: 0.3 mg/dL (ref 0.3–1.2)
Total Protein: 6.1 g/dL — ABNORMAL LOW (ref 6.5–8.1)

## 2021-11-22 LAB — URINALYSIS, MICROSCOPIC (REFLEX)

## 2021-11-22 LAB — CBG MONITORING, ED
Glucose-Capillary: 470 mg/dL — ABNORMAL HIGH (ref 70–99)
Glucose-Capillary: 344 mg/dL — ABNORMAL HIGH (ref 70–99)
Glucose-Capillary: 307 mg/dL — ABNORMAL HIGH (ref 70–99)
Glucose-Capillary: 223 mg/dL — ABNORMAL HIGH (ref 70–99)

## 2021-11-22 LAB — URINALYSIS, ROUTINE W REFLEX MICROSCOPIC
Bilirubin Urine: NEGATIVE
Glucose, UA: >=500 mg/dL — AB
Ketones, ur: NEGATIVE mg/dL
Nitrite: NEGATIVE
Protein, ur: 100 mg/dL — AB
Specific Gravity, Urine: 1.01 (ref 1.005–1.030)
pH: 7 (ref 5.0–8.0)

## 2021-11-22 LAB — PREGNANCY, URINE: Preg Test, Ur: NEGATIVE

## 2021-11-22 LAB — BRAIN NATRIURETIC PEPTIDE: B Natriuretic Peptide: 307 pg/mL — ABNORMAL HIGH (ref 0.0–100.0)

## 2021-11-22 MED ORDER — LISINOPRIL 10 MG PO TABS
10.0000 mg | ORAL_TABLET | Freq: Every day | ORAL | Status: DC
  Administered 2021-11-22: 20:00:00 10 mg via ORAL
  Filled 2021-11-22: qty 1

## 2021-11-22 MED ORDER — DEXTROSE 50 % IV SOLN: 0.0000 mL | INTRAVENOUS | Status: DC | PRN

## 2021-11-22 MED ORDER — DEXTROSE IN LACTATED RINGERS 5 % IV SOLN: INTRAVENOUS | Status: DC

## 2021-11-22 MED ORDER — HYDRALAZINE HCL 20 MG/ML IJ SOLN
5.0000 mg | Freq: Four times a day (QID) | INTRAMUSCULAR | Status: DC | PRN
  Administered 2021-11-29: 17:00:00 5 mg via INTRAVENOUS
  Filled 2021-11-22: qty 1

## 2021-11-22 MED ORDER — HYDRALAZINE HCL 25 MG PO TABS
25.0000 mg | ORAL_TABLET | Freq: Two times a day (BID) | ORAL | Status: DC
  Administered 2021-11-22 – 2021-11-29 (×14): 25 mg via ORAL
  Filled 2021-11-22 (×16): qty 1

## 2021-11-22 MED ORDER — NYSTATIN 100000 UNIT/GM EX POWD
1.0000 "application " | Freq: Three times a day (TID) | CUTANEOUS | Status: DC
  Administered 2021-11-22 – 2021-11-29 (×21): 1 via TOPICAL
  Filled 2021-11-22 (×3): qty 15

## 2021-11-22 MED ORDER — FUROSEMIDE 10 MG/ML IJ SOLN
40.0000 mg | Freq: Once | INTRAMUSCULAR | Status: AC
  Administered 2021-11-22: 17:00:00 40 mg via INTRAVENOUS
  Filled 2021-11-22: qty 4

## 2021-11-22 MED ORDER — PANTOPRAZOLE SODIUM 40 MG PO TBEC
40.0000 mg | DELAYED_RELEASE_TABLET | Freq: Every day | ORAL | Status: DC
  Administered 2021-11-22 – 2021-11-29 (×8): 40 mg via ORAL
  Filled 2021-11-22 (×8): qty 1

## 2021-11-22 MED ORDER — INSULIN REGULAR(HUMAN) IN NACL 100-0.9 UT/100ML-% IV SOLN
INTRAVENOUS | Status: DC
  Administered 2021-11-22: 19:00:00 9.5 [IU]/h via INTRAVENOUS
  Filled 2021-11-22: qty 100

## 2021-11-22 MED ORDER — ZINC OXIDE 40 % EX OINT
1.0000 "application " | TOPICAL_OINTMENT | Freq: Four times a day (QID) | CUTANEOUS | Status: AC
  Administered 2021-11-23 – 2021-11-24 (×2): 1 via TOPICAL
  Filled 2021-11-22 (×2): qty 57

## 2021-11-22 MED ORDER — ENOXAPARIN SODIUM 40 MG/0.4ML IJ SOSY
40.0000 mg | PREFILLED_SYRINGE | INTRAMUSCULAR | Status: DC
  Administered 2021-11-22 – 2021-11-29 (×6): 40 mg via SUBCUTANEOUS
  Filled 2021-11-22 (×7): qty 0.4

## 2021-11-22 NOTE — ED Notes (Signed)
Pt bedding changed, new purewick and pad placed under pt.

## 2021-11-22 NOTE — ED Notes (Signed)
Patients O2 saturation at 88% on room air with good pleth. Placed the patient on 2 lpm via [place], O2 sat came up to 98%.  Provider made aware.

## 2021-11-22 NOTE — H&P (Signed)
TRH H&P   Patient Demographics:    Diamond [name], is a 25 y.o. female  MRN: [id number]   DOB - Apr 26, 1996  Admit Date - 11/24/2021  Outpatient Primary MD for the patient is [name], PA-C  Referring MD/NP/PA: Wyvonna Plum  Outpatient Specialists: Renal Dr. Theador Hawthorne  Patient coming from: Home  No chief complaint on file.     HPI:    Diamond [name]  is a 25 y.o. female, with past medical history of DVT, bipolar disorder, Crohn's disease, hypertension, nephrotic syndrome, CVA, type 1 diabetes mellitus, pancreatitis, patient presents to ED secondary to complaints of worsening edema, patient was seen at [facility] ED last night with similar complaints, apparently she was diuresed at their ER over 1000 cc, patient presents to ED today as reports she is not feeling much better, she still feeling significant swelling and tightness through her entire body, denies nausea, vomiting, chest pain or shortness of breath. -In ED work-up significant for hyperglycemia with a glucose of 564, patient reports she is compliant with her insulin, as well reports she is compliant with her diuresis and all medications, on physical exam she is with massive edema, and received IV Lasix, [facility] hospitalist consulted to admit.    Review of systems:    In addition to the HPI above, No Fever-chills, No Headache, No changes with Vision or hearing, No problems swallowing food or Liquids, No Chest pain, Cough or Shortness of Breath, No Abdominal pain, No Nausea or Vommitting, Bowel movements are regular, No Blood in stool or Urine, No dysuria, No new skin rashes or bruises, No new joints pains-aches,  No new weakness, tingling, numbness in any extremity, No recent weight gain or loss, No polyuria, polydypsia or polyphagia, No significant Mental Stressors.  A full 10 point Review of Systems was  done, except as stated above, all other Review of Systems were negative.   With Past History of the following :    Past Medical History:  Diagnosis Date   ADHD (attention deficit hyperactivity disorder) 2007   Allergy    Arm DVT (deep venous thromboembolism), acute, right ([place]) 04/07/2021   Asthma    Bipolar disorder ([place])    Crohn's colitis ([place])    Dx Dr. Cherrie Gauze   Diabetic nephropathy [facility])    Discitis of lumbar region 05/29/2021   Edema    Eosinophilic esophagitis    GERD (gastroesophageal reflux disease)    Hypertension    MSSA bacteremia 04/07/2021   Nephrotic syndrome    Pancreatitis    Panic attacks    PTSD (post-traumatic stress disorder)    Stroke ([place]) 07/2020   R side   Type 1 diabetes ([place]) 2008      Past Surgical History:  Procedure Laterality Date   ADENOIDECTOMY     BIOPSY  02/01/2021   Procedure: BIOPSY;  Surgeon: Rogene Houston, MD;  [facility]  [facility];  Service: Endoscopy;;  duodenal, esophageal,   BIOPSY  02/03/2021   Procedure: BIOPSY;  Surgeon: Eloise Harman, DO;  Location: [facility];  Service: Endoscopy;;   COLONOSCOPY WITH PROPOFOL N/A 02/01/2021   Procedure: COLONOSCOPY WITH PROPOFOL;  Surgeon: Rogene Houston, MD;  Location: [facility];  Service: Endoscopy;  Laterality: N/A;   COLONOSCOPY WITH PROPOFOL N/A 02/03/2021   Surgeon: Eloise Harman, DO; preparation of the colon was fair after copious lavage, nonbleeding internal hemorrhoids, TI appeared normal s/p biopsy which revealed chronic ileitis with no activity, granulomata, or malignancy.   ESOPHAGOGASTRODUODENOSCOPY (EGD) WITH PROPOFOL N/A 02/01/2021   Surgeon: Rogene Houston, MD; longitudinally marked mucosal in the esophagus biopsied (chronic esophagitis with increased eosinophils compatible with EOE), normal stomach and duodenum.  Duodenum was biopsied with unremarkable pathology.   HERNIA REPAIR     TEE WITHOUT CARDIOVERSION N/A 03/09/2021   Procedure:  TRANSESOPHAGEAL ECHOCARDIOGRAM (TEE) WITH PROPOFOL;  Surgeon: Arnoldo Lenis, MD;  Location: [facility];  Service: Endoscopy;  Laterality: N/A;   TONSILLECTOMY        Social History:     Social History   Tobacco Use   Smoking status: Never   Smokeless tobacco: Never  Substance Use Topics   Alcohol use: No     Family History :     Family History  Problem Relation Age of Onset   Hypertension Mother    Diabetes Mother    Heart disease Mother    Crohn's disease Father    Alcohol abuse Father    Drug abuse Father    Mental illness Father    Cancer Father    Hypertension Maternal Aunt    Seizures Maternal Aunt    Depression Maternal Aunt    Asthma Maternal Uncle    Hypertension Maternal Uncle    Cancer Maternal Grandmother    Diabetes Maternal Grandmother    Mental illness Maternal Grandfather       Home Medications:   Prior to Admission medications   Medication Sig Start Date End Date Taking? Authorizing Provider  acetaminophen (TYLENOL) 325 MG tablet Take 650 mg by mouth every 6 (six) hours as needed.   Yes [provider]  albuterol (VENTOLIN HFA) 108 (90 Base) MCG/ACT inhaler Inhale 2 puffs into the lungs every 6 (six) hours as needed for wheezing or shortness of breath. For shortness of breath 10/19/21  Yes [name], PA-C  furosemide (LASIX) 40 MG tablet Take 1 tablet (40 mg total) by mouth daily. 10/19/21  Yes [name], PA-C  hydrALAZINE (APRESOLINE) 25 MG tablet Take 1 tablet (25 mg total) by mouth 2 (two) times daily. 10/19/21  Yes [name], PA-C  insulin glargine (LANTUS) 100 UNIT/ML injection Inject 0.3 mLs (30 Units total) into the skin at bedtime. Patient taking differently: Inject 40 Units into the skin 2 (two) times daily. 09/14/21  Yes Nida, Marella Chimes, MD  Insulin Glulisine (APIDRA [place]) Inject 10-16 Units into the skin 3 (three) times daily before meals. ssi   Yes [provider]  omeprazole (PRILOSEC)  40 MG capsule Take 1 capsule (40 mg total) by mouth in the morning and at bedtime. Patient taking differently: Take 20 mg by mouth daily as needed (acid reflux). 10/19/21  Yes [name], PA-C  carvedilol (COREG) 6.25 MG tablet Take 1 tablet (6.25 mg total) by mouth 2 (two) times daily with a meal. Patient not taking: Reported on 11/01/2021 10/19/21   [name],  PA-C  cephALEXin (KEFLEX) 500 MG capsule Take 500 mg by mouth 3 (three) times daily. Patient not taking: Reported on 11/21/2021 09/23/21   [provider]  cyclobenzaprine (FLEXERIL) 10 MG tablet Take by mouth. Patient not taking: Reported on 11/29/2021 11/15/21   [provider]  isosorbide mononitrate (IMDUR) 30 MG 24 hr tablet Take 0.5 tablets (15 mg total) by mouth daily. Patient not taking: Reported on 10/04/2021 03/19/21   Barton Dubois, MD  metolazone (ZAROXOLYN) 5 MG tablet Take 5 mg po on Monday, Wednesday, and Friday Patient not taking: Reported on 11/24/2021 08/18/21   [provider]  ondansetron (ZOFRAN ODT) 8 MG disintegrating tablet Take 1 tablet (8 mg total) by mouth every 8 (eight) hours as needed for nausea or vomiting. Patient not taking: Reported on 10/04/2021 03/10/21   Roxan Hockey, MD  predniSONE (DELTASONE) 20 MG tablet Take 20 mg by mouth daily. Patient not taking: Reported on 11/16/2021 11/02/21   [provider]     Allergies:     Allergies  Allergen Reactions   Amoxicillin Hives    Did it involve swelling of the face/tongue/throat, SOB, or low BP? no  Did it involve sudden or severe rash/hives, skin peeling, or any reaction on the inside of your mouth or nose? no Did you need to seek medical attention at a hospital or doctor's office? no When did it last happen?   unk    If all above answers are NO, may proceed with cephalosporin use.   Dexamethasone Hives and Itching   Doxycycline Itching and Swelling   Penicillins Hives and Itching    Did it involve  swelling of the face/tongue/throat, SOB, or low BP? no  Did it involve sudden or severe rash/hives, skin peeling, or any reaction on the inside of your mouth or nose? no Did you need to seek medical attention at a hospital or doctor's office? no When did it last happen?   unk    If all above answers are "NO", may proceed with cephalosporin use. Did it involve swelling of the face/tongue/throat, SOB, or low BP? no  Did it involve sudden or severe rash/hives, skin peeling, or any reac   Ceftriaxone Sodium In Dextrose Itching   Augmentin [Amoxicillin-Pot Clavulanate] Other (See Comments)    Reaction unknown   Clavulanic Acid    Reglan [Metoclopramide]     Shaking Unclear if Reglan was causing shaking, but medication was discontinued on 10/6 for this reason.    Adhesive [Tape] Itching and Rash   Latex Itching and Rash   Other Itching and Rash   Pineapple Rash    Rash on tongue and throat     Physical Exam:   Vitals  Blood pressure (!) 157/125, pulse (!) 111, temperature 97.9 F (36.6 C), temperature source Oral, resp. rate 14, height 4' 11"  (1.499 m), weight 80.7 kg, SpO2 99 %.   1. General well developed female, laying in bed in no apparent distress  2. Normal affect and insight, Not Suicidal or Homicidal, Awake Alert, Oriented X 3.  3. No F.N deficits, ALL C.Nerves Intact, Strength 5/5 all 4 extremities, Sensation intact all 4 extremities, Plantars down going.  4. Ears and Eyes appear Normal, Conjunctivae clear, PERRLA. Moist Oral Mucosa.  5. Supple Neck, No JVD, No cervical lymphadenopathy appriciated, No Carotid Bruits.  6. Symmetrical Chest wall movement, diminished air entry at the bases  7.  Tachycardic, No Gallops, Rubs or Murmurs, No Parasternal Heave.  8. Positive Bowel Sounds,  Abdomen Soft, No tenderness, No organomegaly appriciated,No rebound -guarding or rigidity.  9.  No Cyanosis, Normal Skin Turgor, No Skin Rash or Bruise.  Patient with massive overload +3  edema all the way up to the thigh, as well distended abdominal wall from volume overload, she is with facial edema as well.  10. Good muscle tone,  joints appear normal , no effusions, Normal ROM.  11. No Palpable Lymph Nodes in Neck or Axillae  Patient was seen and examined with female chaperone CNA Janett Billow   Data Review:    CBC Recent Labs  Lab 11/08/2021 1544  WBC 8.3  HGB 10.6*  HCT 34.0*  PLT 274  MCV 95.2  MCH 29.7  MCHC 31.2  RDW 13.3  LYMPHSABS 1.5  MONOABS 0.6  EOSABS 0.4  BASOSABS 0.1   ------------------------------------------------------------------------------------------------------------------  Chemistries  Recent Labs  Lab 11/13/2021 1544  NA 131*  K 4.7  CL 100  CO2 24  GLUCOSE 564*  BUN 22*  CREATININE 1.37*  CALCIUM 8.0*  AST 16  ALT 19  ALKPHOS 124  BILITOT 0.3   ------------------------------------------------------------------------------------------------------------------ estimated creatinine clearance is 57.7 mL/min (A) (by C-G formula based on SCr of 1.37 mg/dL (H)). ------------------------------------------------------------------------------------------------------------------ No results for input(s): TSH, T4TOTAL, T3FREE, THYROIDAB in the last 72 hours.  Invalid input(s): FREET3  Coagulation profile No results for input(s): INR, PROTIME in the last 168 hours. ------------------------------------------------------------------------------------------------------------------- No results for input(s): DDIMER in the last 72 hours. -------------------------------------------------------------------------------------------------------------------  Cardiac Enzymes No results for input(s): CKMB, TROPONINI, MYOGLOBIN in the last 168 hours.  Invalid input(s): CK ------------------------------------------------------------------------------------------------------------------    Component Value Date/Time   BNP 307.0 (H) 11/06/2021 1544      ---------------------------------------------------------------------------------------------------------------  Urinalysis    Component Value Date/Time   COLORURINE YELLOW 11/12/2021 1715   APPEARANCEUR HAZY (A) 11/06/2021 1715   APPEARANCEUR Clear 11/15/2020 1531   LABSPEC 1.010 11/24/2021 1715   PHURINE 7.0 11/05/2021 1715   GLUCOSEU >=500 (A) 11/09/2021 1715   HGBUR SMALL (A) 12/01/2021 1715   BILIRUBINUR NEGATIVE 11/18/2021 1715   BILIRUBINUR negative 11/01/2021 0908   BILIRUBINUR Negative 11/15/2020 1531   KETONESUR NEGATIVE 11/02/2021 1715   PROTEINUR 100 (A) 11/06/2021 1715   UROBILINOGEN 0.2 11/01/2021 0908   UROBILINOGEN 0.2 09/13/2012 2014   NITRITE NEGATIVE 11/06/2021 1715   LEUKOCYTESUR SMALL (A) 11/21/2021 1715    ----------------------------------------------------------------------------------------------------------------   Imaging Results:    DG Chest 2 View  Result Date: 11/28/2021 CLINICAL DATA:  Shortness of breath, anasarca EXAM: CHEST - 2 VIEW COMPARISON:  Chest radiograph 11/21/2021 FINDINGS: The cardiomediastinal silhouette is stable. Lung volumes remain low. There are patchy opacities in the lung bases, worsened on the right and not significantly changed on the left. The upper lungs remain well aerated. There is no overt pulmonary edema there is no significant pleural effusion. There is no pneumothorax. There is no acute osseous abnormality. IMPRESSION: 1. Low lung volumes with patchy opacities in the bases, slightly worsened on the right in the interim, which could reflect atelectasis, infection, or aspiration. 2. No overt pulmonary edema or pleural effusion. Electronically Signed   By: Valetta Mole M.D.   On: 12/01/2021 16:40     EKG:  Vent. rate 113 BPM PR interval 133 ms QRS duration 75 ms QT/QTcB 300/412 ms P-R-T axes 65 78 17 Sinus tachycardia Low voltage, extremity and precordial leads     Assessment & Plan:    Principal Problem:    Hyperosmolar hyperglycemic state (HHS) (HCC) Active Problems:   Anasarca  Diabetes mellitus, type I, uncontrolled with HHS -Patient is type I diabetic, reports she is compliant with her insulin, she was aggressively diuresed yesterday at [facility] which was likely provoked her HHS, her pH> 7.3, bicarb is 24, normal anion gap, she is not in DKA, but [place] with very significantly elevated hyperglycemia at 564 -Managed at [facility] protocol, once her glucose is controlled she can be transitioned to her home dose Lantus of 40 units twice daily and insulin sliding scale. -She is in a unique situation here, where she is massively volume overload due to her nephrotic syndrome with massive anasarca, so I will hold on giving IV fluid unless her CBG less than 250 for which I will do LR with glucose at 50 cc/h . -Once her blood glucose is controlled she will need to be aggressively diuresed.  MASSIVE anasarca/nephrotic syndrome -Patient is massively volume overloaded, +3 edema all the way to her mid abdomen, with facial swelling , she is currently 178 pounds, she does appear to was 144 pounds at her nephrologist visit in October. she will need to be aggressively diuresed once her blood glucose is controlled. -Continue with home dose lisinopril. -Was on Lasix 40 mg oral daily and metolazone 5 mg every Monday/Wednesday/Friday  CKD stage II/III AAA -Renal function at baseline, continue to monitor closely  GERD -Continue with PPI  Hypertension -Resume home medications  Right hip pain -Patient report she had a fall at [facility], she complains of right hip pain, will check x-ray.   DVT Prophylaxis Lovenox   AM Labs Ordered, also please review Full Orders  Family Communication: Admission, patients condition and plan of care including tests being ordered have been discussed with the patient  who indicate understanding and agree with the plan and Code Status.  Code Status  full  Likely DC to  home  Condition GUARDED    Consults called: none    Admission status:   inpatient  Time spent in minutes : 60 minutes   Phillips Climes M.D on 11/10/2021 at 7:02 PM   [facility] - Office  [phone]

## 2021-11-22 NOTE — ED Triage Notes (Signed)
Pt states she was at [facility] last night for the same thing, pt states she is full of fluid, she feels she was not treated appropriate last night and wants to be reassessed.

## 2021-11-22 NOTE — ED Provider Notes (Signed)
[facility] EMERGENCY DEPARTMENT Provider Note   CSN: [id number] Arrival date & time: 11/09/2021  1433     History No chief complaint on file.   Diamond [name] is a 25 y.o. female.  HPI Patient is a 25 year old female with a history of DVT, bipolar disorder, Crohn's colitis, hypertension, nephrotic syndrome, pancreatitis, CVA, type 1 diabetes, who presents to the emergency department due to swelling.  Patient was seen at [facility] last night with similar complaints.  She states that she presents today for reevaluation. Per records, patient seen last night for anasarca.  Patient was diuresed over 1000 cc.  Wound care was discussed.  Patient was evaluated by medicine as well and they did not feel that she was a candidate for hospitalization at that time.  She was then discharged home in stable condition.  Patient states that her symptoms persisted after being discharged from the hospital.  She states that she has swelling and tightness from her feet through her neck.  Reports associated shortness of breath.  Also complains of dysuria.  No chest pain.  No nausea, vomiting, diarrhea.    Past Medical History:  Diagnosis Date   ADHD (attention deficit hyperactivity disorder) 2007   Allergy    Arm DVT (deep venous thromboembolism), acute, right ([place]) 04/07/2021   Asthma    Bipolar disorder ([place])    Crohn's colitis ([place])    Dx Dr. Cherrie Gauze   Diabetic nephropathy [facility])    Discitis of lumbar region 05/29/2021   Edema    Eosinophilic esophagitis    GERD (gastroesophageal reflux disease)    Hypertension    MSSA bacteremia 04/07/2021   Nephrotic syndrome    Pancreatitis    Panic attacks    PTSD (post-traumatic stress disorder)    Stroke ([place]) 07/2020   R side   Type 1 diabetes ([place]) 2008    Patient Active Problem List   Diagnosis Date Noted   Psychosocial problem 09/14/2021   Rectal bleeding 09/06/2021   Suprapubic pain 09/06/2021   Odynophagia 09/06/2021   Atypical  chest pain    Elevated troponin    Diabetic ketoacidosis without coma associated with type 1 diabetes mellitus ([place])    DKA, type 1 ([place]) 08/11/2021   Discitis of lumbar region 05/29/2021   Diarrhea 04/11/2021   Pain of upper abdomen 00/17/4944   Eosinophilic esophagitis 96/75/9163   MSSA bacteremia 04/07/2021   Arm DVT (deep venous thromboembolism), acute, right ([place]) 04/07/2021   AKI (acute kidney injury) ([place]) 04/01/2021   Pyelonephritis 03/31/2021   Severe low back pain    Intractable nausea and vomiting 03/16/2021   Nausea and vomiting    DM1 with Diabetic nephropathy with proteinuria and Nephrotic Syndrome- 03/10/2021    Class: Chronic   Community acquired pneumonia 03/05/2021   Type 2 or unspecified type diabetes mellitus 02/23/2021   Bladder outlet obstruction    Constipation    Abdominal pain, epigastric    Abnormal computed tomography of cecum and terminal ileum    Hyperosmolar hyperglycemic state (HHS) ([place]) 01/30/2021   Crohn's disease ([place]) 12/08/2020   Diabetic gastroparesis ([place]) 12/08/2020   Essential hypertension, benign 11/01/2020   Edema 10/19/2020   Anasarca 10/18/2020   Hypoalbuminemia-due to proteinuria/nephrotic syndrome 10/18/2020   Personal history of noncompliance with medical treatment, presenting hazards to health 10/12/2020   Bilateral lower extremity edema 08/25/2020   Pneumonia 08/25/2020   Ileus ([place]) 08/06/2020   Methicillin susceptible Staphylococcus aureus infection, unspecified site 07/08/2020   Cellulitis and  abscess of buttock 06/20/2020   Skin abscess 04/24/2020   Mild intermittent asthma without complication 26/20/3559   Sepsis due to skin infection ([place]) 04/17/2020   Type 1 diabetes mellitus, uncontrolled 10/01/2013   Eczema 10/23/2012   Flank pain 08/27/2012   Gastroesophageal reflux disease 07/25/2012   Mood disorder ([place]) 07/09/2012   Contraception management 07/09/2012   History of Crohn's disease 07/09/2012   Uncontrolled  type 1 diabetes mellitus with hyperglycemia and Diabetic Nephropathy 07/08/2012    Class: Chronic   Asthma, persistent controlled 07/08/2012    Past Surgical History:  Procedure Laterality Date   ADENOIDECTOMY     BIOPSY  02/01/2021   Procedure: BIOPSY;  Surgeon: Rogene Houston, MD;  Location: [facility] ENDO SUITE;  Service: Endoscopy;;  duodenal, esophageal,   BIOPSY  02/03/2021   Procedure: BIOPSY;  Surgeon: Eloise Harman, DO;  Location: [facility] ENDO SUITE;  Service: Endoscopy;;   COLONOSCOPY WITH PROPOFOL N/A 02/01/2021   Procedure: COLONOSCOPY WITH PROPOFOL;  Surgeon: Rogene Houston, MD;  Location: [facility] ENDO SUITE;  Service: Endoscopy;  Laterality: N/A;   COLONOSCOPY WITH PROPOFOL N/A 02/03/2021   Surgeon: Eloise Harman, DO; preparation of the colon was fair after copious lavage, nonbleeding internal hemorrhoids, TI appeared normal s/p biopsy which revealed chronic ileitis with no activity, granulomata, or malignancy.   ESOPHAGOGASTRODUODENOSCOPY (EGD) WITH PROPOFOL N/A 02/01/2021   Surgeon: Rogene Houston, MD; longitudinally marked mucosal in the esophagus biopsied (chronic esophagitis with increased eosinophils compatible with EOE), normal stomach and duodenum.  Duodenum was biopsied with unremarkable pathology.   HERNIA REPAIR     TEE WITHOUT CARDIOVERSION N/A 03/09/2021   Procedure: TRANSESOPHAGEAL ECHOCARDIOGRAM (TEE) WITH PROPOFOL;  Surgeon: Arnoldo Lenis, MD;  Location: [facility] ENDO [facility];  Service: Endoscopy;  Laterality: N/A;   TONSILLECTOMY       OB History   No obstetric history on file.     Family History  Problem Relation Age of Onset   Hypertension Mother    Diabetes Mother    Heart disease Mother    Crohn's disease Father    Alcohol abuse Father    Drug abuse Father    Mental illness Father    Cancer Father    Hypertension Maternal Aunt    Seizures Maternal Aunt    Depression Maternal Aunt    Asthma Maternal Uncle    Hypertension Maternal Uncle    Cancer  Maternal Grandmother    Diabetes Maternal Grandmother    Mental illness Maternal Grandfather     Social History   Tobacco Use   Smoking status: Never   Smokeless tobacco: Never  Vaping Use   Vaping Use: Never used  Substance Use Topics   Alcohol use: No   Drug use: No    Home Medications Prior to Admission medications   Medication Sig Start Date End Date Taking? Authorizing Provider  cyclobenzaprine (FLEXERIL) 10 MG tablet Take by mouth. 11/15/21  Yes [provider]  metolazone (ZAROXOLYN) 5 MG tablet Take 5 mg po on Monday, Wednesday, and Friday 08/18/21  Yes [provider]  albuterol (VENTOLIN HFA) 108 (90 Base) MCG/ACT inhaler Inhale 2 puffs into the lungs every 6 (six) hours as needed for wheezing or shortness of breath. For shortness of breath Patient not taking: Reported on 11/01/2021 10/19/21   [name], PA-C  carvedilol (COREG) 6.25 MG tablet Take 1 tablet (6.25 mg total) by mouth 2 (two) times daily with a meal. Patient not taking: Reported on 11/01/2021 10/19/21  [name], PA-C  cephALEXin (KEFLEX) 500 MG capsule Take 500 mg by mouth 3 (three) times daily. 09/23/21   [provider]  furosemide (LASIX) 40 MG tablet Take 1 tablet (40 mg total) by mouth daily. Patient not taking: Reported on 11/01/2021 10/19/21   [name], PA-C  hydrALAZINE (APRESOLINE) 25 MG tablet Take 1 tablet (25 mg total) by mouth 2 (two) times daily. Patient not taking: Reported on 11/01/2021 10/19/21   [name], PA-C  insulin glargine (LANTUS) 100 UNIT/ML injection Inject 0.3 mLs (30 Units total) into the skin at bedtime. 09/14/21   [place]ria Anger, MD  Insulin Glulisine (APIDRA [place]) Inject 10-16 Units into the skin 3 (three) times daily before meals. ssi    [provider]  isosorbide mononitrate (IMDUR) 30 MG 24 hr tablet Take 0.5 tablets (15 mg total) by mouth daily. Patient not taking: Reported on 10/04/2021 03/19/21    Barton Dubois, MD  omeprazole (PRILOSEC) 40 MG capsule Take 1 capsule (40 mg total) by mouth in the morning and at bedtime. Patient not taking: Reported on 11/01/2021 10/19/21   [name], PA-C  ondansetron (ZOFRAN ODT) 8 MG disintegrating tablet Take 1 tablet (8 mg total) by mouth every 8 (eight) hours as needed for nausea or vomiting. Patient not taking: Reported on 10/04/2021 03/10/21   Roxan Hockey, MD  predniSONE (DELTASONE) 20 MG tablet Take 20 mg by mouth daily. 11/02/21   [provider]    Allergies    Amoxicillin, Dexamethasone, Doxycycline, Penicillins, Ceftriaxone sodium in dextrose, Augmentin [amoxicillin-pot clavulanate], Clavulanic acid, Reglan [metoclopramide], Adhesive [tape], Latex, Other, and Pineapple  Review of Systems   Review of Systems  All other systems reviewed and are negative. Ten systems reviewed and are negative for acute change, except as noted in the HPI.   Physical Exam Updated Vital Signs BP (!) 164/122    Pulse (!) 111    Temp 97.9 F (36.6 C) (Oral)    Resp (!) 23    Ht 4' 11"  (1.499 m)    Wt 80.7 kg    SpO2 99%    BMI 35.95 kg/m   Physical Exam Vitals and nursing note reviewed.  Constitutional:      General: She is not in acute distress.    Appearance: Normal appearance. She is not ill-appearing, toxic-appearing or diaphoretic.  HENT:     Head: Normocephalic and atraumatic.     Comments: Mild edema noted across the face and eyelids.    Right Ear: External ear normal.     Left Ear: External ear normal.     Nose: Nose normal.     Mouth/Throat:     Mouth: Mucous membranes are moist.     Pharynx: Oropharynx is clear. No oropharyngeal exudate or posterior oropharyngeal erythema.  Eyes:     Extraocular Movements: Extraocular movements intact.  Cardiovascular:     Rate and Rhythm: Normal rate and regular rhythm.     Pulses: Normal pulses.     Heart sounds: Normal heart sounds. No murmur heard.   No friction rub. No gallop.   Pulmonary:     Effort: Pulmonary effort is normal. No respiratory distress.     Breath sounds: Normal breath sounds. No stridor. No wheezing, rhonchi or rales.  Abdominal:     General: Abdomen is flat. There is distension.     Palpations: Abdomen is soft.     Tenderness: There is abdominal tenderness.     Comments: Severely distended abdomen.  Mild tenderness  noted diffusely across the abdomen.  Musculoskeletal:        General: Normal range of motion.     Cervical back: Normal range of motion and neck supple. No tenderness.     Right lower leg: Edema present.     Left lower leg: Edema present.     Comments: 3+ pitting edema noted bilaterally.  Skin:    General: Skin is warm and dry.  Neurological:     General: No focal deficit present.     Mental Status: She is alert and oriented to person, place, and time.  Psychiatric:        Mood and Affect: Mood normal.        Behavior: Behavior normal.   ED Results / Procedures / Treatments   Labs (all labs ordered are listed, but only abnormal results are displayed) Labs Reviewed  CBC WITH DIFFERENTIAL/PLATELET - Abnormal; Notable for the following components:      Result Value   RBC 3.57 (*)    Hemoglobin 10.6 (*)    HCT 34.0 (*)    All other components within normal limits  COMPREHENSIVE METABOLIC PANEL - Abnormal; Notable for the following components:   Sodium 131 (*)    Glucose, Bld 564 (*)    BUN 22 (*)    Creatinine, Ser 1.37 (*)    Calcium 8.0 (*)    Total Protein 6.1 (*)    Albumin 1.8 (*)    GFR, Estimated 55 (*)    All other components within normal limits  BRAIN NATRIURETIC PEPTIDE - Abnormal; Notable for the following components:   B Natriuretic Peptide 307.0 (*)    All other components within normal limits  URINALYSIS, ROUTINE W REFLEX MICROSCOPIC - Abnormal; Notable for the following components:   APPearance HAZY (*)    Glucose, UA >=500 (*)    Hgb urine dipstick SMALL (*)    Protein, ur 100 (*)     Leukocytes,Ua SMALL (*)    All other components within normal limits  BLOOD GAS, VENOUS - Abnormal; Notable for the following components:   pO2, Ven 46.0 (*)    All other components within normal limits  URINALYSIS, MICROSCOPIC (REFLEX) - Abnormal; Notable for the following components:   Bacteria, UA RARE (*)    All other components within normal limits  RESP PANEL BY RT-PCR (FLU A&B, COVID) ARPGX2  PREGNANCY, URINE  TROPONIN I (HIGH SENSITIVITY)    EKG None  Radiology DG Chest 2 View  Result Date: 11/03/2021 CLINICAL DATA:  Shortness of breath, anasarca EXAM: CHEST - 2 VIEW COMPARISON:  Chest radiograph 11/21/2021 FINDINGS: The cardiomediastinal silhouette is stable. Lung volumes remain low. There are patchy opacities in the lung bases, worsened on the right and not significantly changed on the left. The upper lungs remain well aerated. There is no overt pulmonary edema there is no significant pleural effusion. There is no pneumothorax. There is no acute osseous abnormality. IMPRESSION: 1. Low lung volumes with patchy opacities in the bases, slightly worsened on the right in the interim, which could reflect atelectasis, infection, or aspiration. 2. No overt pulmonary edema or pleural effusion. Electronically Signed   By: Valetta Mole M.D.   On: 11/04/2021 16:40    Procedures Procedures   Medications Ordered in ED Medications  furosemide (LASIX) injection 40 mg (40 mg Intravenous Given 11/11/2021 1720)   ED Course  I have reviewed the triage vital signs and the nursing notes.  Pertinent labs & imaging results that were  available during my care of the patient were reviewed by me and considered in my medical decision making (see chart for details).  Clinical Course as of 12/02/2021 1839  Wed Nov 22, 2021  1513 Patient desaturated to 88% on room air.  Placed on 2 L via nasal cannula with improvement into the high 90s. [LJ]    Clinical Course User Index [LJ] Rayna Sexton, PA-C    MDM Rules/Calculators/A&P                          Pt is a 25 y.o. female who presents to the ED due to anasarca, SOB as well as hyperglycemia.   Labs: CBC with a hemoglobin of 10.6, hematocrit of 34. CMP with a pseudohyponatremia of 131, glucose of 564, BUN of 22, creatinine of 1.37, calcium of 8, total protein of 6.1, albumin of 1.8, GFR 55. BNP elevated at 307. Troponin of 16. VBG with a pH of 7.317. UA with greater than 500 glucose, small hemoglobin, 100 protein, small leukocytes, rare bacteria. Pregnancy test is negative. Respiratory panel is negative.  Imaging: CXR showing IMPRESSION: 1. Low lung volumes with patchy opacities in the bases, slightly worsened on the right in the interim, which could reflect atelectasis, infection, or aspiration. 2. No overt pulmonary edema or pleural effusion.  I, Rayna Sexton, PA-C, personally reviewed and evaluated these images and lab results as part of my medical decision-making.  Patient with significant anasarca.  3+ edema in the lower extremities.  Edema spanning all the way through the face.  Patient endorses shortness of breath.  Chest x-ray does not show any overt pulmonary edema or pleural effusion.  Patient desaturated to 88% on room air was placed on 2 L via nasal cannula improving to the high 90s.  She states she has been compliant with her Lasix.  Patient given a 40 mg dose of Lasix in her IV has produced about 800 cc of urine.  Patient's protein found to be down to 6.1 with an albumin of 1.8.  100 protein noted on patient's UA.  Possibly the source of her anasarca.  Blood sugar found to be elevated at 564.  She states that her sugar was in the 200s yesterday and she has been using her insulin.  pH within normal limits at 7.317.  No elevation in anion gap.  No ketonuria.  Doubt DKA.  Given the severity of the patient's anasarca as well as her episode of hypoxia, feel that admission is reasonable.  This was discussed with the patient  and she is amenable.  We will discuss with the medicine team.  Note: Portions of this report may have been transcribed using voice recognition software. Every effort was made to ensure accuracy; however, inadvertent computerized transcription errors may be present.   Final Clinical Impression(s) / ED Diagnoses Final diagnoses:  Anasarca  Hypoalbuminemia  Hypoxia   Rx / DC Orders ED Discharge Orders     None        Rayna Sexton, PA-C 11/28/2021 1839    Dorie Rank, MD 11/26/21 1007

## 2021-11-23 ENCOUNTER — Inpatient Hospital Stay (HOSPITAL_COMMUNITY): Payer: Medicaid Other

## 2021-11-23 ENCOUNTER — Other Ambulatory Visit: Payer: Self-pay

## 2021-11-23 DIAGNOSIS — E8809 Other disorders of plasma-protein metabolism, not elsewhere classified: Secondary | ICD-10-CM

## 2021-11-23 DIAGNOSIS — N179 Acute kidney failure, unspecified: Secondary | ICD-10-CM

## 2021-11-23 DIAGNOSIS — L8990 Pressure ulcer of unspecified site, unspecified stage: Secondary | ICD-10-CM | POA: Diagnosis present

## 2021-11-23 LAB — BASIC METABOLIC PANEL
Anion gap: 7 (ref 5–15)
Anion gap: 5 (ref 5–15)
Anion gap: 7 (ref 5–15)
Anion gap: 6 (ref 5–15)
BUN: 24 mg/dL — ABNORMAL HIGH (ref 6–20)
BUN: 24 mg/dL — ABNORMAL HIGH (ref 6–20)
BUN: 25 mg/dL — ABNORMAL HIGH (ref 6–20)
BUN: 25 mg/dL — ABNORMAL HIGH (ref 6–20)
CO2: 26 mmol/L (ref 22–32)
CO2: 26 mmol/L (ref 22–32)
CO2: 25 mmol/L (ref 22–32)
CO2: 24 mmol/L (ref 22–32)
Calcium: 7.9 mg/dL — ABNORMAL LOW (ref 8.9–10.3)
Calcium: 7.8 mg/dL — ABNORMAL LOW (ref 8.9–10.3)
Calcium: 7.9 mg/dL — ABNORMAL LOW (ref 8.9–10.3)
Calcium: 7.7 mg/dL — ABNORMAL LOW (ref 8.9–10.3)
Chloride: 103 mmol/L (ref 98–111)
Chloride: 103 mmol/L (ref 98–111)
Chloride: 104 mmol/L (ref 98–111)
Chloride: 100 mmol/L (ref 98–111)
Creatinine, Ser: 1.52 mg/dL — ABNORMAL HIGH (ref 0.44–1.00)
Creatinine, Ser: 1.61 mg/dL — ABNORMAL HIGH (ref 0.44–1.00)
Creatinine, Ser: 1.67 mg/dL — ABNORMAL HIGH (ref 0.44–1.00)
Creatinine, Ser: 1.73 mg/dL — ABNORMAL HIGH (ref 0.44–1.00)
GFR, Estimated: 49 mL/min — ABNORMAL LOW (ref >60)
GFR, Estimated: 45 mL/min — ABNORMAL LOW (ref >60)
GFR, Estimated: 43 mL/min — ABNORMAL LOW (ref >60)
GFR, Estimated: 42 mL/min — ABNORMAL LOW (ref >60)
Glucose, Bld: 181 mg/dL — ABNORMAL HIGH (ref 70–99)
Glucose, Bld: 124 mg/dL — ABNORMAL HIGH (ref 70–99)
Glucose, Bld: 118 mg/dL — ABNORMAL HIGH (ref 70–99)
Glucose, Bld: 182 mg/dL — ABNORMAL HIGH (ref 70–99)
Potassium: 3.7 mmol/L (ref 3.5–5.1)
Potassium: 3.7 mmol/L (ref 3.5–5.1)
Potassium: 4 mmol/L (ref 3.5–5.1)
Potassium: 4 mmol/L (ref 3.5–5.1)
Sodium: 136 mmol/L (ref 135–145)
Sodium: 134 mmol/L — ABNORMAL LOW (ref 135–145)
Sodium: 136 mmol/L (ref 135–145)
Sodium: 130 mmol/L — ABNORMAL LOW (ref 135–145)

## 2021-11-23 LAB — CBG MONITORING, ED
Glucose-Capillary: 163 mg/dL — ABNORMAL HIGH (ref 70–99)
Glucose-Capillary: 167 mg/dL — ABNORMAL HIGH (ref 70–99)
Glucose-Capillary: 177 mg/dL — ABNORMAL HIGH (ref 70–99)
Glucose-Capillary: 196 mg/dL — ABNORMAL HIGH (ref 70–99)
Glucose-Capillary: 82 mg/dL (ref 70–99)
Glucose-Capillary: 90 mg/dL (ref 70–99)
Glucose-Capillary: 99 mg/dL (ref 70–99)

## 2021-11-23 LAB — CBC
HCT: 27.6 % — ABNORMAL LOW (ref 36.0–46.0)
Hemoglobin: 8.9 g/dL — ABNORMAL LOW (ref 12.0–15.0)
MCH: 29.7 pg (ref 26.0–34.0)
MCHC: 32.2 g/dL (ref 30.0–36.0)
MCV: 92 fL (ref 80.0–100.0)
Platelets: 327 10*3/uL (ref 150–400)
RBC: 3 MIL/uL — ABNORMAL LOW (ref 3.87–5.11)
RDW: 13.2 % (ref 11.5–15.5)
WBC: 9.3 10*3/uL (ref 4.0–10.5)
nRBC: 0 % (ref 0.0–0.2)

## 2021-11-23 LAB — GLUCOSE, CAPILLARY
Glucose-Capillary: 108 mg/dL — ABNORMAL HIGH (ref 70–99)
Glucose-Capillary: 170 mg/dL — ABNORMAL HIGH (ref 70–99)
Glucose-Capillary: 315 mg/dL — ABNORMAL HIGH (ref 70–99)

## 2021-11-23 LAB — HEMOGLOBIN A1C
Hgb A1c MFr Bld: 15.2 % — ABNORMAL HIGH (ref 4.8–5.6)
Mean Plasma Glucose: 390 mg/dL

## 2021-11-23 LAB — HIV ANTIBODY (ROUTINE TESTING W REFLEX): HIV Screen 4th Generation wRfx: NONREACTIVE

## 2021-11-23 LAB — OSMOLALITY: Osmolality: 310 mOsm/kg — ABNORMAL HIGH (ref 275–295)

## 2021-11-23 MED ORDER — INSULIN GLARGINE-YFGN 100 UNIT/ML ~~LOC~~ SOLN
20.0000 [IU] | Freq: Once | SUBCUTANEOUS | Status: DC
  Filled 2021-11-23: qty 0.2

## 2021-11-23 MED ORDER — INSULIN GLARGINE-YFGN 100 UNIT/ML ~~LOC~~ SOLN
20.0000 [IU] | Freq: Two times a day (BID) | SUBCUTANEOUS | Status: DC
  Administered 2021-11-23 (×2): 20 [IU] via SUBCUTANEOUS
  Filled 2021-11-23 (×8): qty 0.2

## 2021-11-23 MED ORDER — FUROSEMIDE 10 MG/ML IJ SOLN
40.0000 mg | Freq: Two times a day (BID) | INTRAMUSCULAR | Status: DC
  Administered 2021-11-23 – 2021-11-24 (×3): 40 mg via INTRAVENOUS
  Filled 2021-11-23 (×3): qty 4

## 2021-11-23 MED ORDER — ACETAMINOPHEN 325 MG PO TABS
650.0000 mg | ORAL_TABLET | Freq: Four times a day (QID) | ORAL | Status: DC | PRN
  Administered 2021-11-23 – 2021-11-30 (×6): 650 mg via ORAL
  Filled 2021-11-23 (×6): qty 2

## 2021-11-23 MED ORDER — INSULIN GLARGINE-YFGN 100 UNIT/ML ~~LOC~~ SOLN
40.0000 [IU] | Freq: Two times a day (BID) | SUBCUTANEOUS | Status: DC
  Filled 2021-11-23 (×2): qty 0.4

## 2021-11-23 MED ORDER — ONDANSETRON HCL 4 MG/2ML IJ SOLN
4.0000 mg | Freq: Once | INTRAMUSCULAR | Status: AC
  Administered 2021-11-23: 07:00:00 4 mg via INTRAVENOUS
  Filled 2021-11-23: qty 2

## 2021-11-23 MED ORDER — INSULIN ASPART 100 UNIT/ML IJ SOLN: 0.0000 [IU] | INTRAMUSCULAR | Status: DC

## 2021-11-23 MED ORDER — METOLAZONE 5 MG PO TABS
5.0000 mg | ORAL_TABLET | Freq: Once | ORAL | Status: AC
  Administered 2021-11-23: 11:00:00 5 mg via ORAL
  Filled 2021-11-23: qty 1

## 2021-11-23 MED ORDER — INSULIN ASPART 100 UNIT/ML IJ SOLN
0.0000 [IU] | Freq: Three times a day (TID) | INTRAMUSCULAR | Status: DC
  Administered 2021-11-23 – 2021-11-27 (×6): 2 [IU] via SUBCUTANEOUS
  Administered 2021-11-27 (×2): 3 [IU] via SUBCUTANEOUS
  Administered 2021-11-28 (×3): 2 [IU] via SUBCUTANEOUS
  Administered 2021-11-29: 17:00:00 1 [IU] via SUBCUTANEOUS
  Administered 2021-11-29: 12:00:00 2 [IU] via SUBCUTANEOUS
  Administered 2021-11-29: 08:00:00 1 [IU] via SUBCUTANEOUS

## 2021-11-23 MED ORDER — INSULIN DETEMIR 100 UNIT/ML ~~LOC~~ SOLN
10.0000 [IU] | SUBCUTANEOUS | Status: DC
  Administered 2021-11-23: 05:00:00 10 [IU] via SUBCUTANEOUS
  Filled 2021-11-23 (×2): qty 0.1

## 2021-11-23 MED ORDER — INSULIN ASPART 100 UNIT/ML IJ SOLN
5.0000 [IU] | Freq: Three times a day (TID) | INTRAMUSCULAR | Status: DC
  Administered 2021-11-23 – 2021-11-24 (×3): 5 [IU] via SUBCUTANEOUS

## 2021-11-23 MED ORDER — ALBUMIN HUMAN 25 % IV SOLN
25.0000 g | Freq: Four times a day (QID) | INTRAVENOUS | Status: AC
  Administered 2021-11-23 – 2021-11-24 (×3): 25 g via INTRAVENOUS
  Filled 2021-11-23 (×7): qty 100

## 2021-11-23 NOTE — Progress Notes (Signed)
PROGRESS NOTE    Diamond [name]  TMA:[id number] DOB: Dec 15, 1995 DOA: 11/04/2021 PCP: [name], PA-C    Brief Narrative:  25 year old female with a history of type 1 diabetes, nephrotic syndrome, presents to the hospital with uncontrolled blood sugars and significant volume overload/anasarca.  She does report compliance with her home diabetes medications as well as diuretics.  She was started on insulin infusion with improvement of blood sugars.  Also started on IV diuretics.  Nephrology following.   Assessment & Plan:   Principal Problem:   Hyperosmolar hyperglycemic state (HHS) ([place]) Active Problems:   Anasarca   Pressure injury of skin   Type 1 diabetes, uncontrolled with hyperglycemia -Started on insulin infusion on admission -Noted to have normal anion gap -Overall blood sugars have improved -She has been transitioned to subcutaneous insulin -Continue to follow blood sugars  Anasarca/volume overload in the setting of nephrotic syndrome -Patient reports compliance with diuretics at home -Weight appears to be approximately 30 pounds -We will start on IV Lasix and albumin infusions -We will request nephrology input  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 -Creatinine trended up since admission to 1.6 -Baseline creatinine possibly around 1.1 -Checking renal ultrasound -Continue to monitor in the setting of diuretics, appears to be volume overloaded  Hypertension -Holding ACE inhibitor since creatinine appears to be trending up -She is continued on hydralazine  Right hip pain -X-ray unremarkable  Anemia -Likely related to chronic kidney disease -No reported bleeding -Continue to monitor   DVT prophylaxis: enoxaparin (LOVENOX) injection 40 mg Start: 11/17/2021 2000 SCDs Start: 11/07/2021 1941  Code Status: Full code Family Communication: Discussed with patient Disposition Plan: Status is: Inpatient  Remains inpatient appropriate because: Continued inpatient stay for  IV diuresis and monitoring renal function       Consultants:  Nephrology  Procedures:    Antimicrobials:      Subjective: She is not having any nausea or vomiting.  She is hungry now and wants to eat.  Feels she is still very edematous.  Objective: Vitals:   11/23/21 0930 11/23/21 1047 11/23/21 1048 11/23/21 1331  BP: 121/86  (!) 145/96 (!) 149/97  Pulse: (!) 115 (!) 113 (!) 112 (!) 113  Resp: 17 20 18    Temp: 99.1 F (37.3 C)  98.8 F (37.1 C) 99.6 F (37.6 C)  TempSrc: Oral  Oral Oral  SpO2: 93%  99% 96%  Weight:      Height:        Intake/Output Summary (Last 24 hours) at 11/23/2021 2118 Last data filed at 11/23/2021 1700 Gross per 24 hour  Intake 871.07 ml  Output 950 ml  Net -78.93 ml   Filed Weights   11/24/2021 1443  Weight: 80.7 kg    Examination:  General exam: Appears calm and comfortable  Respiratory system: Clear to auscultation. Respiratory effort normal. Cardiovascular system: S1 & S2 heard, RRR. No JVD, murmurs, rubs, gallops or clicks. No pedal edema. Gastrointestinal system: Abdomen is nondistended, soft and nontender. No organomegaly or masses felt. Normal bowel sounds heard. Central nervous system: Alert and oriented. No focal neurological deficits. Extremities: Anasarca present Skin: No rashes, lesions or ulcers Psychiatry: Judgement and insight appear normal. Mood & affect appropriate.     Data Reviewed: I have personally reviewed following labs and imaging studies  CBC: Recent Labs  Lab 11/28/2021 1544 11/23/21 0618  WBC 8.3 9.3  NEUTROABS 5.8  --   HGB 10.6* 8.9*  HCT 34.0* 27.6*  MCV 95.2 92.0  PLT 274  409   Basic Metabolic Panel: Recent Labs  Lab 11/06/2021 2153 11/23/21 0204 11/23/21 0618 11/23/21 1148 11/23/21 1436  NA 134* 136 134* 136 130*  K 3.8 3.7 3.7 4.0 4.0  CL 103 103 103 104 100  CO2 25 26 26 25 24   GLUCOSE 348* 181* 124* 118* 182*  BUN 23* 24* 24* 25* 25*  CREATININE 1.34* 1.52* 1.61* 1.67* 1.73*   CALCIUM 7.9* 7.9* 7.8* 7.9* 7.7*   GFR: Estimated Creatinine Clearance: 45.7 mL/min (A) (by C-G formula based on SCr of 1.73 mg/dL (H)). Liver Function Tests: Recent Labs  Lab 11/04/2021 1544  AST 16  ALT 19  ALKPHOS 124  BILITOT 0.3  PROT 6.1*  ALBUMIN 1.8*   No results for input(s): LIPASE, AMYLASE in the last 168 hours. No results for input(s): AMMONIA in the last 168 hours. Coagulation Profile: No results for input(s): INR, PROTIME in the last 168 hours. Cardiac Enzymes: No results for input(s): CKTOTAL, CKMB, CKMBINDEX, TROPONINI in the last 168 hours. BNP (last 3 results) No results for input(s): PROBNP in the last 8760 hours. HbA1C: No results for input(s): HGBA1C in the last 72 hours. CBG: Recent Labs  Lab 11/23/21 0632 11/23/21 0717 11/23/21 0935 11/23/21 1148 11/23/21 1629  GLUCAP 99 90 82 108* 170*   Lipid Profile: No results for input(s): CHOL, HDL, LDLCALC, TRIG, CHOLHDL, LDLDIRECT in the last 72 hours. Thyroid Function Tests: No results for input(s): TSH, T4TOTAL, FREET4, T3FREE, THYROIDAB in the last 72 hours. Anemia Panel: No results for input(s): VITAMINB12, FOLATE, FERRITIN, TIBC, IRON, RETICCTPCT in the last 72 hours. Sepsis Labs: No results for input(s): PROCALCITON, LATICACIDVEN in the last 168 hours.  Recent Results (from the past 240 hour(s))  Resp Panel by RT-PCR (Flu A&B, Covid) Nasopharyngeal Swab     Status: None   Collection Time: 11/26/2021  5:24 PM   Specimen: Nasopharyngeal Swab; Nasopharyngeal(NP) swabs in vial transport medium  Result Value Ref Range Status   SARS Coronavirus 2 by RT PCR NEGATIVE NEGATIVE Final    Comment: (NOTE) SARS-CoV-2 target nucleic acids are NOT DETECTED.  The SARS-CoV-2 RNA is generally detectable in upper respiratory specimens during the acute phase of infection. The lowest concentration of SARS-CoV-2 viral copies this assay can detect is 138 copies/mL. A negative result does not preclude  SARS-Cov-2 infection and should not be used as the sole basis for treatment or other patient management decisions. A negative result may occur with  improper specimen collection/handling, submission of specimen other than nasopharyngeal swab, presence of viral mutation(s) within the areas targeted by this assay, and inadequate number of viral copies(<138 copies/mL). A negative result must be combined with clinical observations, patient history, and epidemiological information. The expected result is Negative.  Fact Sheet for Patients:  EntrepreneurPulse.com.au  Fact Sheet for Healthcare Providers:  IncredibleEmployment.be  This test is no t yet approved or cleared by the [place] FDA and  has been authorized for detection and/or diagnosis of SARS-CoV-2 by FDA under an Emergency Use Authorization (EUA). This EUA will remain  in effect (meaning this test can be used) for the duration of the COVID-19 declaration under Section 564(b)(1) of the Act, 21 U.S.C.section 360bbb-3(b)(1), unless the authorization is terminated  or revoked sooner.       Influenza A by PCR NEGATIVE NEGATIVE Final   Influenza B by PCR NEGATIVE NEGATIVE Final    Comment: (NOTE) The Xpert Xpress SARS-CoV-2/FLU/RSV plus assay is intended as an aid in the diagnosis of influenza from  Nasopharyngeal swab specimens and should not be used as a sole basis for treatment. Nasal washings and aspirates are unacceptable for Xpert Xpress SARS-CoV-2/FLU/RSV testing.  Fact Sheet for Patients: EntrepreneurPulse.com.au  Fact Sheet for Healthcare Providers: IncredibleEmployment.be  This test is not yet approved or cleared by the [place] FDA and has been authorized for detection and/or diagnosis of SARS-CoV-2 by FDA under an Emergency Use Authorization (EUA). This EUA will remain in effect (meaning this test can be used) for the duration of  the COVID-19 declaration under Section 564(b)(1) of the Act, 21 U.S.C. section 360bbb-3(b)(1), unless the authorization is terminated or revoked.  Performed at [facility], [address], [place], [place] [address]          Radiology Studies: DG Chest 2 View  Result Date: 11/24/2021 CLINICAL DATA:  Shortness of breath, anasarca EXAM: CHEST - 2 VIEW COMPARISON:  Chest radiograph 11/21/2021 FINDINGS: The cardiomediastinal silhouette is stable. Lung volumes remain low. There are patchy opacities in the lung bases, worsened on the right and not significantly changed on the left. The upper lungs remain well aerated. There is no overt pulmonary edema there is no significant pleural effusion. There is no pneumothorax. There is no acute osseous abnormality. IMPRESSION: 1. Low lung volumes with patchy opacities in the bases, slightly worsened on the right in the interim, which could reflect atelectasis, infection, or aspiration. 2. No overt pulmonary edema or pleural effusion. Electronically Signed   By: Valetta Mole M.D.   On: 11/28/2021 16:40   US RENAL  Result Date: 11/23/2021 CLINICAL DATA:  AK I EXAM: RENAL / URINARY TRACT ULTRASOUND COMPLETE COMPARISON:  Abdominal ultrasound 03/18/2021 FINDINGS: Right Kidney: Renal measurements: 11 x 5 x 6.4 cm = volume: 183 mL. Mildly increased echogenicity. No hydronephrosis or focal mass identified. Left Kidney: Renal measurements: 11.2 x 6.3 x 6.2 cm = volume: 227 mL. Mildly increased echogenicity. No hydronephrosis or focal mass identified. Bladder: Appears normal for degree of bladder distention. Other: None. IMPRESSION: Increased echogenicity of the kidneys suggesting medical renal disease. No hydronephrosis. Electronically Signed   By: Ofilia Neas M.D.   On: 11/23/2021 10:39   DG HIP UNILAT WITH PELVIS 2-3 VIEWS RIGHT  Result Date: 11/23/2021 CLINICAL DATA:  Atraumatic right hip pain. EXAM: DG HIP (WITH OR WITHOUT PELVIS) 2-3V RIGHT COMPARISON:   None. FINDINGS: There is no evidence of hip fracture or dislocation. There is no evidence of arthropathy or other focal bone abnormality. IMPRESSION: Negative. Electronically Signed   By: Virgina Norfolk M.D.   On: 11/12/2021 19:20        Scheduled Meds:  enoxaparin (LOVENOX) injection  40 mg Subcutaneous Q24H   furosemide  40 mg Intravenous BID   hydrALAZINE  25 mg Oral BID   insulin aspart  0-9 Units Subcutaneous TID WC   insulin aspart  5 Units Subcutaneous TID WC   insulin glargine-yfgn  20 Units Subcutaneous Once   insulin glargine-yfgn  20 Units Subcutaneous BID   liver oil-zinc oxide  1 application Topical U0A   nystatin  1 application Topical TID   pantoprazole  40 mg Oral Daily   Continuous Infusions:  albumin human 25 g (11/23/21 1647)   dextrose 5% lactated ringers Stopped (11/23/21 0635)     LOS: 1 day    Time spent: 63mns    JKathie Dike MD [facility]   If 7PM-7AM, please contact night-coverage [email or web address]  11/23/2021, 9:18 PM

## 2021-11-23 NOTE — Progress Notes (Signed)
Inpatient Diabetes Program Recommendations  AACE/ADA: New Consensus Statement on Inpatient Glycemic Control (2015)  Target Ranges:  Prepandial:   less than 140 mg/dL      Peak postprandial:   less than 180 mg/dL (1-2 hours)      Critically ill patients:  140 - 180 mg/dL   Lab Results  Component Value Date   GLUCAP 108 (H) 11/23/2021   HGBA1C 13.5 (H) 08/11/2021    Review of Glycemic Control  Diabetes history: type 1 Outpatient Diabetes medications: Lantus 40 units BID, Apidra 5-20 units SSI TID. (Confirmed by patient) Current orders for Inpatient glycemic control: Semglee 20 units BID, Novolog SENSITIVE correction scale TID, Novolog 5 units TID with meals if eating at least 50% of meal.  Inpatient Diabetes Program Recommendations:   Spoke with Dr. Roderic Palau in regards to insulin dosages. Agree with current insulin orders. Titrate dosages as needed.  Spoke with patient on the phone to confirm the dosages that she was taking at home. Patient states that she was taking her insulin everyday.   Will continue to monitor blood sugars while in the hospital.  Harvel Ricks RN BSN CDE Diabetes Coordinator Pager: [phone]  8am-5pm

## 2021-11-23 NOTE — Consult Note (Signed)
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 Follows with Dr. Theador Hawthorne ([place]) -baseline hard to ascertain given multiple AKI events, likely has a bseline around 1.1 (as of Sept). Cr up from 1.4 to 1.6 today-related to diuresis? Monitor for now -Urine sediment with yeast and RBC's--on topical nystatin -check renal u/s -Ideally needs to be on an ACEI/ARB in the long term -Continue to monitor daily Cr, Dose meds for GFR<15 -Monitor Daily I/Os, Daily weight  -Maintain MAP>65 for optimal renal perfusion.  -Agree with holding ACE-I, avoid further nephrotoxins including NSAIDS, Morphine.  Unless absolutely necessary, avoid CT with contrast and/or MRI with gadolinium.     Anasarca secondary to nephrotic syndrome -trial lasix with albumin. Can leave lasix at 28m iv BID, titrate to 863mif no real response with albumin+lasix. Will also give one dose of metolazone 1m34moday. Recommend checking echo as well. Check daily weight and strict I/O. Would recommend transitioning to torsemide after effective IV diuresis  Uncontrolled DM1 with hyperglycemia, HHS -per primary  Hypoalbuminemia secondary to nephrotic syndrome -push protein, diurese as above w/ albumin  Hypertension: -Resume home meds, BP controlled. Recommend having a low threshold to stop hydralazine as she is getting aggressively diuresed  Hyponatremia-improved -had pseudohyponatremia on admit, now sugars better, Na stable 134--hypervolemic. Diurese as above  Anemia: -Transfuse for Hgb<7 g/dL -check iron panel. Hgb down to 8.9 from 10.6--recommend ruling out bleed-per primary   Recommendations conveyed to primary service.   Diamond [name] 11/23/2021 9:21 AM   _____________________________________________________________________________________   History of Present Illness: Diamond [name] a [age]o.  female with a past medical history of CKD with biopsy proven diabetic glomerulosclerosis and mild focal IF/TA (s/p biopsy 03/07/21), nephrotic range proteinuria, DM1-uncontrolled, h/o multiples AKIs, Chrons, BPD, h/o DVT, HTN, h/o CVA, h/o pancreatitis who presents to [facility] with worsening swelling. [facility] similar compliants and was diureses about 1L however symptoms did not improve hence she presented here. She reports that this is the worst swelling she has ever had. She also reports that that she has not been taking metolazone at home. Swelling is all over. She also reports that she has been having SOB, orthopnea, DOE. Received lasix 95m3m here. [facility].   Medications:  Current Facility-Administered Medications  Medication Dose Route Frequency Provider Last Rate Last Admin   albumin human 25 % solution 25 g  25 g Intravenous Q6H Memon, Jehanzeb, MD       dextrose 5 % in lactated ringers infusion   Intravenous Continuous Elgergawy, DawoSilver Huguenin   Stopped at 11/23/21 0635   dextrose 50 % solution 0-50 mL  0-50 mL Intravenous PRN Elgergawy, DawoSilver Huguenin       enoxaparin (LOVENOX) injection 40 mg  40 mg Subcutaneous Q24H Elgergawy, DawoSilver Huguenin   40 mg at 11/27/2021 2004   furosemide (LASIX) injection 40 mg  40 mg Intravenous BID MemoKathie Dike       hydrALAZINE (APRESOLINE) injection 5 mg  5 mg Intravenous Q6H PRN Elgergawy, DawoSilver Huguenin       hydrALAZINE (APRESOLINE) tablet 25 mg  25 mg Oral BID Elgergawy, DawoSilver Huguenin   25 mg at 11/28/2021 2208   insulin aspart (novoLOG) injection 0-15 Units  0-15 Units Subcutaneous Q4H Zierle-Ghosh, Asia B, DO  insulin aspart (novoLOG) injection 5 Units  5 Units Subcutaneous TID WC Memon, Jehanzeb, MD       insulin glargine-yfgn (SEMGLEE) injection 20 Units  20 Units Subcutaneous Once Kathie Dike, MD       insulin glargine-yfgn (SEMGLEE) injection 40 Units   40 Units Subcutaneous BID Kathie Dike, MD       liver oil-zinc oxide (DESITIN) 40 % ointment 1 application  1 application Topical W0J Elgergawy, Silver Huguenin, MD       nystatin (MYCOSTATIN/NYSTOP) topical powder 1 application  1 application Topical TID Elgergawy, Silver Huguenin, MD   1 application at 81/19/14 2208   pantoprazole (PROTONIX) EC tablet 40 mg  40 mg Oral Daily Elgergawy, Silver Huguenin, MD   40 mg at 11/04/2021 1949   Current Outpatient Medications  Medication Sig Dispense Refill   acetaminophen (TYLENOL) 325 MG tablet Take 650 mg by mouth every 6 (six) hours as needed.     albuterol (VENTOLIN HFA) 108 (90 Base) MCG/ACT inhaler Inhale 2 puffs into the lungs every 6 (six) hours as needed for wheezing or shortness of breath. For shortness of breath 3 each 0   furosemide (LASIX) 40 MG tablet Take 1 tablet (40 mg total) by mouth daily. 90 tablet 0   hydrALAZINE (APRESOLINE) 25 MG tablet Take 1 tablet (25 mg total) by mouth 2 (two) times daily. 90 tablet 0   insulin glargine (LANTUS) 100 UNIT/ML injection Inject 0.3 mLs (30 Units total) into the skin at bedtime. (Patient taking differently: Inject 40 Units into the skin 2 (two) times daily.) 10 mL 1   Insulin Glulisine (APIDRA [place]) Inject 10-16 Units into the skin 3 (three) times daily before meals. ssi     omeprazole (PRILOSEC) 40 MG capsule Take 1 capsule (40 mg total) by mouth in the morning and at bedtime. (Patient taking differently: Take 20 mg by mouth daily as needed (acid reflux).) 90 capsule 0   carvedilol (COREG) 6.25 MG tablet Take 1 tablet (6.25 mg total) by mouth 2 (two) times daily with a meal. (Patient not taking: Reported on 11/01/2021) 180 tablet 0   cephALEXin (KEFLEX) 500 MG capsule Take 500 mg by mouth 3 (three) times daily. (Patient not taking: Reported on 11/27/2021)     cyclobenzaprine (FLEXERIL) 10 MG tablet Take by mouth. (Patient not taking: Reported on 11/15/2021)     isosorbide mononitrate (IMDUR) 30 MG 24 hr tablet Take 0.5  tablets (15 mg total) by mouth daily. (Patient not taking: Reported on 10/04/2021)     metolazone (ZAROXOLYN) 5 MG tablet Take 5 mg po on Monday, Wednesday, and Friday (Patient not taking: Reported on 11/14/2021)     ondansetron (ZOFRAN ODT) 8 MG disintegrating tablet Take 1 tablet (8 mg total) by mouth every 8 (eight) hours as needed for nausea or vomiting. (Patient not taking: Reported on 10/04/2021) 20 tablet 2   predniSONE (DELTASONE) 20 MG tablet Take 20 mg by mouth daily. (Patient not taking: Reported on 11/21/2021)       ALLERGIES Amoxicillin, Dexamethasone, Doxycycline, Penicillins, Ceftriaxone sodium in dextrose, Augmentin [amoxicillin-pot clavulanate], Clavulanic acid, Reglan [metoclopramide], Adhesive [tape], Latex, Other, and Pineapple  MEDICAL HISTORY Past Medical History:  Diagnosis Date   ADHD (attention deficit hyperactivity disorder) 2007   Allergy    Arm DVT (deep venous thromboembolism), acute, right ([place]) 04/07/2021   Asthma    Bipolar disorder ([place])    Crohn's colitis ([place])    Dx Dr. Cherrie Gauze   Diabetic nephropathy ([place])  Discitis of lumbar region 05/29/2021   Edema    Eosinophilic esophagitis    GERD (gastroesophageal reflux disease)    Hypertension    MSSA bacteremia 04/07/2021   Nephrotic syndrome    Pancreatitis    Panic attacks    PTSD (post-traumatic stress disorder)    Stroke ([place]) 07/2020   R side   Type 1 diabetes ([place]) 2008     SOCIAL HISTORY Social History   Socioeconomic History   Marital status: Single    Spouse name: Not on file   Number of children: Not on file   Years of education: Not on file   Highest education level: Not on file  Occupational History   Not on file  Tobacco Use   Smoking status: Never   Smokeless tobacco: Never  Vaping Use   Vaping Use: Never used  Substance and Sexual Activity   Alcohol use: No   Drug use: No   Sexual activity: Not Currently  Other Topics Concern   Not on file  Social History  Narrative   Not on file   Social Determinants of Health   Financial Resource Strain: Not on file  Food Insecurity: Not on file  Transportation Needs: Not on file  Physical Activity: Not on file  Stress: Not on file  Social Connections: Not on file  Intimate Partner Violence: Not on file     FAMILY HISTORY Family History  Problem Relation Age of Onset   Hypertension Mother    Diabetes Mother    Heart disease Mother    Crohn's disease Father    Alcohol abuse Father    Drug abuse Father    Mental illness Father    Cancer Father    Hypertension Maternal Aunt    Seizures Maternal Aunt    Depression Maternal Aunt    Asthma Maternal Uncle    Hypertension Maternal Uncle    Cancer Maternal Grandmother    Diabetes Maternal Grandmother    Mental illness Maternal Grandfather      Review of Systems: 12 systems reviewed Otherwise as per HPI, all other systems reviewed and negative  Physical Exam: Vitals:   11/23/21 0430 11/23/21 0721  BP: 100/64 120/83  Pulse: (!) 111 (!) 114  Resp: 18 19  Temp:  98.8 F (37.1 C)  SpO2: 98% 98%   No intake/output data recorded. No intake or output data in the 24 hours ending 11/23/21 0921 General: no acute distress HEENT: facial swelling CV: s1s2, tachycardic, normal rhythm, no murmurs, no gallops, no rubs Lungs: diminished air entry bibasilar, no overt w/r/r/c, speaking in full sentences, normal WOB, b/l chest expansion Abd: soft, non-tender, distended Skin: no visible lesions or rashes Psych: alert, engaged, appropriate mood and affect Musculoskeletal: diffuse severe anasarca Neuro: normal speech, no gross focal deficits   Test Results Reviewed Lab Results  Component Value Date   NA 134 (L) 11/23/2021   K 3.7 11/23/2021   CL 103 11/23/2021   CO2 26 11/23/2021   BUN 24 (H) 11/23/2021   CREATININE 1.61 (H) 11/23/2021   CALCIUM 7.8 (L) 11/23/2021   ALBUMIN 1.8 (L) 11/21/2021   PHOS 4.8 (H) 07/13/2021     I have reviewed  all relevant outside healthcare records related to the patient's kidney injury.

## 2021-11-23 NOTE — Progress Notes (Signed)
Pt on I&O's, non compliant with diet. Has ate an entire small pizza ordered from [employer]. Pt blood sugar was 170, insulin administered as ordered. Pt had large BM, and urine.

## 2021-11-23 NOTE — ED Notes (Signed)
Pt in bed, meal tray given, pt removed her oxygen, replaced nasal canula.

## 2021-11-23 NOTE — ED Notes (Signed)
Pt in bed with blanket pulled over her head, pt reports a 7/10 headache, resps even and unlabored.

## 2021-11-23 NOTE — Progress Notes (Signed)
°  Transition of Care [facility]) Screening Note   Patient Details  Name: Diamond [name] Date of Birth: 08-Apr-1996   Transition of Care [facility]) CM/SW Contact:    Ihor Gully, LCSW Phone Number: 11/23/2021, 3:30 PM    Transition of Care Department [facility]) has reviewed patient and no TOC needs have been identified at this time. We will continue to monitor patient advancement through interdisciplinary progression rounds. If new patient transition needs arise, please place a TOC consult.   Tyshae Stair, Clydene Pugh, LCSW

## 2021-11-24 LAB — COMPREHENSIVE METABOLIC PANEL
ALT: 19 U/L (ref 0–44)
AST: 26 U/L (ref 15–41)
Albumin: 3.1 g/dL — ABNORMAL LOW (ref 3.5–5.0)
Alkaline Phosphatase: 86 U/L (ref 38–126)
Anion gap: 12 (ref 5–15)
BUN: 32 mg/dL — ABNORMAL HIGH (ref 6–20)
CO2: 23 mmol/L (ref 22–32)
Calcium: 8.4 mg/dL — ABNORMAL LOW (ref 8.9–10.3)
Chloride: 102 mmol/L (ref 98–111)
Creatinine, Ser: 2.24 mg/dL — ABNORMAL HIGH (ref 0.44–1.00)
GFR, Estimated: 30 mL/min — ABNORMAL LOW (ref >60)
Glucose, Bld: 210 mg/dL — ABNORMAL HIGH (ref 70–99)
Potassium: 3.8 mmol/L (ref 3.5–5.1)
Sodium: 137 mmol/L (ref 135–145)
Total Bilirubin: 0.6 mg/dL (ref 0.3–1.2)
Total Protein: 6.5 g/dL (ref 6.5–8.1)

## 2021-11-24 LAB — CBC
HCT: 28.3 % — ABNORMAL LOW (ref 36.0–46.0)
Hemoglobin: 8.7 g/dL — ABNORMAL LOW (ref 12.0–15.0)
MCH: 29 pg (ref 26.0–34.0)
MCHC: 30.7 g/dL (ref 30.0–36.0)
MCV: 94.3 fL (ref 80.0–100.0)
Platelets: 338 10*3/uL (ref 150–400)
RBC: 3 MIL/uL — ABNORMAL LOW (ref 3.87–5.11)
RDW: 13.4 % (ref 11.5–15.5)
WBC: 8.7 10*3/uL (ref 4.0–10.5)
nRBC: 0 % (ref 0.0–0.2)

## 2021-11-24 LAB — GLUCOSE, CAPILLARY
Glucose-Capillary: 372 mg/dL — ABNORMAL HIGH (ref 70–99)
Glucose-Capillary: 185 mg/dL — ABNORMAL HIGH (ref 70–99)
Glucose-Capillary: 63 mg/dL — ABNORMAL LOW (ref 70–99)
Glucose-Capillary: 112 mg/dL — ABNORMAL HIGH (ref 70–99)
Glucose-Capillary: 72 mg/dL (ref 70–99)
Glucose-Capillary: 211 mg/dL — ABNORMAL HIGH (ref 70–99)

## 2021-11-24 LAB — FERRITIN: Ferritin: 67 ng/mL (ref 11–307)

## 2021-11-24 LAB — IRON AND TIBC
Iron: 48 ug/dL (ref 28–170)
Saturation Ratios: 26 % (ref 10.4–31.8)
TIBC: 185 ug/dL — ABNORMAL LOW (ref 250–450)
UIBC: 137 ug/dL

## 2021-11-24 LAB — VITAMIN B12: Vitamin B-12: 278 pg/mL (ref 180–914)

## 2021-11-24 MED ORDER — INSULIN ASPART 100 UNIT/ML IJ SOLN
8.0000 [IU] | Freq: Once | INTRAMUSCULAR | Status: AC
  Administered 2021-11-24: 02:00:00 8 [IU] via SUBCUTANEOUS

## 2021-11-24 MED ORDER — INSULIN GLARGINE-YFGN 100 UNIT/ML ~~LOC~~ SOLN
20.0000 [IU] | Freq: Two times a day (BID) | SUBCUTANEOUS | Status: DC
  Administered 2021-11-24 – 2021-11-25 (×2): 20 [IU] via SUBCUTANEOUS
  Filled 2021-11-24 (×6): qty 0.2

## 2021-11-24 MED ORDER — INSULIN GLARGINE-YFGN 100 UNIT/ML ~~LOC~~ SOLN
30.0000 [IU] | Freq: Two times a day (BID) | SUBCUTANEOUS | Status: DC
  Administered 2021-11-24: 11:00:00 30 [IU] via SUBCUTANEOUS
  Filled 2021-11-24 (×7): qty 0.3

## 2021-11-24 NOTE — Progress Notes (Signed)
°   11/24/21 0521  Assess: MEWS Score  Temp 98.8 F (37.1 C)  BP 125/72  Pulse Rate (!) 119  SpO2 94 %  Assess: MEWS Score  MEWS Temp 0  MEWS Systolic 0  MEWS Pulse 2  MEWS RR 0  MEWS LOC 0  MEWS Score 2  MEWS Score Color Yellow  Assess: if the MEWS score is Yellow or Red  Were vital signs taken at a resting state? Yes  Focused Assessment No change from prior assessment  Early Detection of Sepsis Score *See Row Information* Low  MEWS guidelines implemented *See Row Information* No, previously yellow, continue vital signs every 4 hours

## 2021-11-24 NOTE — Progress Notes (Signed)
PROGRESS NOTE    Diamond [name]  WNU:[id number] DOB: 1996/03/25 DOA: 11/04/2021 PCP: [name], PA-C    Brief Narrative:  25 year old female with a history of type 1 diabetes, nephrotic syndrome, presents to the hospital with uncontrolled blood sugars and significant volume overload/anasarca.  She does report compliance with her home diabetes medications as well as diuretics.  She was started on insulin infusion with improvement of blood sugars.  Also started on IV diuretics.  Nephrology following.   Assessment & Plan:   Principal Problem:   Hyperosmolar hyperglycemic state (HHS) ([place]) Active Problems:   Anasarca   Pressure injury of skin   Type 1 diabetes, uncontrolled with hyperglycemia -Started on insulin infusion on admission -Noted to have normal anion gap -Overall blood sugars have improved -She has been transitioned to subcutaneous insulin -Continue to follow blood sugars  Anasarca/volume overload in the setting of nephrotic syndrome -Patient reports compliance with diuretics at home -Weight appears to be approximately 30 pounds -She was started on Lasix and albumin infusions -Creatinine trending up with diuretics -Nephrology following, appreciate input -Holding diuretics for today  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 -Creatinine trended up since admission to 2.2 -Baseline creatinine possibly around 1.1 -Renal ultrasound unremarkable -Continue to follow Hypertension -Holding ACE inhibitor since creatinine appears to be trending up -She is continued on hydralazine  Right hip pain -X-ray unremarkable  Anemia -Likely related to chronic kidney disease -No reported bleeding -Continue to monitor   DVT prophylaxis: enoxaparin (LOVENOX) injection 40 mg Start: 11/23/2021 2000 SCDs Start: 12/01/2021 1941  Code Status: Full code Family Communication: Discussed with patient Disposition Plan: Status is: Inpatient  Remains inpatient appropriate because: Continued  inpatient stay for IV diuresis and monitoring renal function       Consultants:  Nephrology  Procedures:    Antimicrobials:      Subjective: Feels that her skin is ripping from edema.  Feels tired today.  Reports adequate p.o. intake.  Objective: Vitals:   11/24/21 0103 11/24/21 0521 11/24/21 0900 11/24/21 1213  BP: 136/84 125/72 129/87 (!) 150/98  Pulse: (!) 124 (!) 119 (!) 120 (!) 118  Resp:   18 16  Temp: 98.7 F (37.1 C) 98.8 F (37.1 C) 98.9 F (37.2 C) 98.6 F (37 C)  TempSrc: Oral Oral Oral Oral  SpO2: 98% 94% 94% 96%  Weight:      Height:        Intake/Output Summary (Last 24 hours) at 11/24/2021 2021 Last data filed at 11/24/2021 1500 Gross per 24 hour  Intake 1480 ml  Output 3500 ml  Net -2020 ml   Filed Weights   11/17/2021 1443  Weight: 80.7 kg    Examination:  General exam: Appears calm and comfortable  Respiratory system: Clear to auscultation. Respiratory effort normal. Cardiovascular system: S1 & S2 heard, RRR. No JVD, murmurs, rubs, gallops or clicks. No pedal edema. Gastrointestinal system: Abdomen is nondistended, soft and nontender. No organomegaly or masses felt. Normal bowel sounds heard. Central nervous system: Alert and oriented. No focal neurological deficits. Extremities: Anasarca present Skin: No rashes, lesions or ulcers Psychiatry: Judgement and insight appear normal. Mood & affect appropriate.     Data Reviewed: I have personally reviewed following labs and imaging studies  CBC: Recent Labs  Lab 11/28/2021 1544 11/23/21 0618 11/24/21 0621  WBC 8.3 9.3 8.7  NEUTROABS 5.8  --   --   HGB 10.6* 8.9* 8.7*  HCT 34.0* 27.6* 28.3*  MCV 95.2 92.0 94.3  PLT 274  327 017   Basic Metabolic Panel: Recent Labs  Lab 11/23/21 0204 11/23/21 0618 11/23/21 1148 11/23/21 1436 11/24/21 0621  NA 136 134* 136 130* 137  K 3.7 3.7 4.0 4.0 3.8  CL 103 103 104 100 102  CO2 26 26 25 24 23   GLUCOSE 181* 124* 118* 182* 210*  BUN 24*  24* 25* 25* 32*  CREATININE 1.52* 1.61* 1.67* 1.73* 2.24*  CALCIUM 7.9* 7.8* 7.9* 7.7* 8.4*   GFR: Estimated Creatinine Clearance: 35.3 mL/min (A) (by C-G formula based on SCr of 2.24 mg/dL (H)). Liver Function Tests: Recent Labs  Lab 11/05/2021 1544 11/24/21 0621  AST 16 26  ALT 19 19  ALKPHOS 124 86  BILITOT 0.3 0.6  PROT 6.1* 6.5  ALBUMIN 1.8* 3.1*   No results for input(s): LIPASE, AMYLASE in the last 168 hours. No results for input(s): AMMONIA in the last 168 hours. Coagulation Profile: No results for input(s): INR, PROTIME in the last 168 hours. Cardiac Enzymes: No results for input(s): CKTOTAL, CKMB, CKMBINDEX, TROPONINI in the last 168 hours. BNP (last 3 results) No results for input(s): PROBNP in the last 8760 hours. HbA1C: Recent Labs    11/23/21 0618  HGBA1C 15.2*   CBG: Recent Labs  Lab 11/24/21 0146 11/24/21 0711 11/24/21 1130 11/24/21 1459 11/24/21 1632  GLUCAP 372* 185* 63* 112* 72   Lipid Profile: No results for input(s): CHOL, HDL, LDLCALC, TRIG, CHOLHDL, LDLDIRECT in the last 72 hours. Thyroid Function Tests: No results for input(s): TSH, T4TOTAL, FREET4, T3FREE, THYROIDAB in the last 72 hours. Anemia Panel: Recent Labs    11/24/21 1039  VITAMINB12 278  FERRITIN 67  TIBC 185*  IRON 48   Sepsis Labs: No results for input(s): PROCALCITON, LATICACIDVEN in the last 168 hours.  Recent Results (from the past 240 hour(s))  Resp Panel by RT-PCR (Flu A&B, Covid) Nasopharyngeal Swab     Status: None   Collection Time: 11/18/2021  5:24 PM   Specimen: Nasopharyngeal Swab; Nasopharyngeal(NP) swabs in vial transport medium  Result Value Ref Range Status   SARS Coronavirus 2 by RT PCR NEGATIVE NEGATIVE Final    Comment: (NOTE) SARS-CoV-2 target nucleic acids are NOT DETECTED.  The SARS-CoV-2 RNA is generally detectable in upper respiratory specimens during the acute phase of infection. The lowest concentration of SARS-CoV-2 viral copies this assay  can detect is 138 copies/mL. A negative result does not preclude SARS-Cov-2 infection and should not be used as the sole basis for treatment or other patient management decisions. A negative result may occur with  improper specimen collection/handling, submission of specimen other than nasopharyngeal swab, presence of viral mutation(s) within the areas targeted by this assay, and inadequate number of viral copies(<138 copies/mL). A negative result must be combined with clinical observations, patient history, and epidemiological information. The expected result is Negative.  Fact Sheet for Patients:  EntrepreneurPulse.com.au  Fact Sheet for Healthcare Providers:  IncredibleEmployment.be  This test is no t yet approved or cleared by the [place] FDA and  has been authorized for detection and/or diagnosis of SARS-CoV-2 by FDA under an Emergency Use Authorization (EUA). This EUA will remain  in effect (meaning this test can be used) for the duration of the COVID-19 declaration under Section 564(b)(1) of the Act, 21 U.S.C.section 360bbb-3(b)(1), unless the authorization is terminated  or revoked sooner.       Influenza A by PCR NEGATIVE NEGATIVE Final   Influenza B by PCR NEGATIVE NEGATIVE Final    Comment: (NOTE)  The Xpert Xpress SARS-CoV-2/FLU/RSV plus assay is intended as an aid in the diagnosis of influenza from Nasopharyngeal swab specimens and should not be used as a sole basis for treatment. Nasal washings and aspirates are unacceptable for Xpert Xpress SARS-CoV-2/FLU/RSV testing.  Fact Sheet for Patients: EntrepreneurPulse.com.au  Fact Sheet for Healthcare Providers: IncredibleEmployment.be  This test is not yet approved or cleared by the [place] FDA and has been authorized for detection and/or diagnosis of SARS-CoV-2 by FDA under an Emergency Use Authorization (EUA). This EUA will  remain in effect (meaning this test can be used) for the duration of the COVID-19 declaration under Section 564(b)(1) of the Act, 21 U.S.C. section 360bbb-3(b)(1), unless the authorization is terminated or revoked.  Performed at [facility], [address] [place], [place] [address]          Radiology Studies: US RENAL  Result Date: 11/23/2021 CLINICAL DATA:  AK I EXAM: RENAL / URINARY TRACT ULTRASOUND COMPLETE COMPARISON:  Abdominal ultrasound 03/18/2021 FINDINGS: Right Kidney: Renal measurements: 11 x 5 x 6.4 cm = volume: 183 mL. Mildly increased echogenicity. No hydronephrosis or focal mass identified. Left Kidney: Renal measurements: 11.2 x 6.3 x 6.2 cm = volume: 227 mL. Mildly increased echogenicity. No hydronephrosis or focal mass identified. Bladder: Appears normal for degree of bladder distention. Other: None. IMPRESSION: Increased echogenicity of the kidneys suggesting medical renal disease. No hydronephrosis. Electronically Signed   By: Ofilia Neas M.D.   On: 11/23/2021 10:39        Scheduled Meds:  enoxaparin (LOVENOX) injection  40 mg Subcutaneous Q24H   hydrALAZINE  25 mg Oral BID   insulin aspart  0-9 Units Subcutaneous TID WC   insulin aspart  5 Units Subcutaneous TID WC   insulin glargine-yfgn  30 Units Subcutaneous BID   nystatin  1 application Topical TID   pantoprazole  40 mg Oral Daily   Continuous Infusions:  dextrose 5% lactated ringers Stopped (11/23/21 0635)     LOS: 2 days    Time spent: 55mns    JKathie Dike MD [facility]   If 7PM-7AM, please contact night-coverage [email or web address]  11/24/2021, 8:21 PM

## 2021-11-24 NOTE — Progress Notes (Signed)
°   11/23/21 2325  Assess: MEWS Score  Temp 99.5 F (37.5 C)  BP (!) 158/118  Pulse Rate (!) 125  SpO2 92 %  O2 Device Room Air  Assess: MEWS Score  MEWS Temp 0  MEWS Systolic 0  MEWS Pulse 2  MEWS RR 0  MEWS LOC 0  MEWS Score 2  MEWS Score Color Yellow  Assess: if the MEWS score is Yellow or Red  Were vital signs taken at a resting state? Yes  Focused Assessment No change from prior assessment  Early Detection of Sepsis Score *See Row Information* Low  MEWS guidelines implemented *See Row Information* No, previously yellow, continue vital signs every 4 hours

## 2021-11-24 NOTE — Progress Notes (Signed)
Patient ID: ALEXINE PILANT, female   DOB: [birth year], 25 y.o.   MRN: [id number] S: Still having some nausea and vomiting this morning. O:BP 125/72    Pulse (!) 119    Temp 98.8 F (37.1 C) (Oral)    Resp 18    Ht 4' 11"  (1.499 m)    Wt 80.7 kg    SpO2 94%    BMI 35.95 kg/m   Intake/Output Summary (Last 24 hours) at 11/24/2021 1001 Last data filed at 11/24/2021 0900 Gross per 24 hour  Intake 1591.07 ml  Output 1450 ml  Net 141.07 ml   Intake/Output: I/O last 3 completed shifts: In: 871.1 [P.O.:480; I.V.:391.1] Out: 1450 [Urine:1450]  Intake/Output this shift:  Total I/O In: 720 [P.O.:720] Out: -  Weight change:  Gen:NAD DVV:OHYWV at 119 Resp:CTA Abd: +BS, soft, NT/Nd Ext: 2+ anasarca  Recent Labs  Lab 11/28/2021 1544 11/23/2021 2153 11/23/21 0204 11/23/21 0618 11/23/21 1148 11/23/21 1436 11/24/21 0621  NA 131* 134* 136 134* 136 130* 137  K 4.7 3.8 3.7 3.7 4.0 4.0 3.8  CL 100 103 103 103 104 100 102  CO2 24 25 26 26 25 24 23   GLUCOSE 564* 348* 181* 124* 118* 182* 210*  BUN 22* 23* 24* 24* 25* 25* 32*  CREATININE 1.37* 1.34* 1.52* 1.61* 1.67* 1.73* 2.24*  ALBUMIN 1.8*  --   --   --   --   --  3.1*  CALCIUM 8.0* 7.9* 7.9* 7.8* 7.9* 7.7* 8.4*  AST 16  --   --   --   --   --  26  ALT 19  --   --   --   --   --  19   Liver Function Tests: Recent Labs  Lab 11/08/2021 1544 11/24/21 0621  AST 16 26  ALT 19 19  ALKPHOS 124 86  BILITOT 0.3 0.6  PROT 6.1* 6.5  ALBUMIN 1.8* 3.1*   No results for input(s): LIPASE, AMYLASE in the last 168 hours. No results for input(s): AMMONIA in the last 168 hours. CBC: Recent Labs  Lab 11/05/2021 1544 11/23/21 0618 11/24/21 0621  WBC 8.3 9.3 8.7  NEUTROABS 5.8  --   --   HGB 10.6* 8.9* 8.7*  HCT 34.0* 27.6* 28.3*  MCV 95.2 92.0 94.3  PLT 274 327 338   Cardiac Enzymes: No results for input(s): CKTOTAL, CKMB, CKMBINDEX, TROPONINI in the last 168 hours. CBG: Recent Labs  Lab 11/23/21 1148 11/23/21 1629 11/23/21 2126  11/24/21 0146 11/24/21 0711  GLUCAP 108* 170* 315* 372* 185*    Iron Studies: No results for input(s): IRON, TIBC, TRANSFERRIN, FERRITIN in the last 72 hours. Studies/Results: DG Chest 2 View  Result Date: 11/10/2021 CLINICAL DATA:  Shortness of breath, anasarca EXAM: CHEST - 2 VIEW COMPARISON:  Chest radiograph 11/21/2021 FINDINGS: The cardiomediastinal silhouette is stable. Lung volumes remain low. There are patchy opacities in the lung bases, worsened on the right and not significantly changed on the left. The upper lungs remain well aerated. There is no overt pulmonary edema there is no significant pleural effusion. There is no pneumothorax. There is no acute osseous abnormality. IMPRESSION: 1. Low lung volumes with patchy opacities in the bases, slightly worsened on the right in the interim, which could reflect atelectasis, infection, or aspiration. 2. No overt pulmonary edema or pleural effusion. Electronically Signed   By: Valetta Mole M.D.   On: 11/18/2021 16:40   US RENAL  Result Date: 11/23/2021 CLINICAL DATA:  AK I EXAM: RENAL / URINARY TRACT ULTRASOUND COMPLETE COMPARISON:  Abdominal ultrasound 03/18/2021 FINDINGS: Right Kidney: Renal measurements: 11 x 5 x 6.4 cm = volume: 183 mL. Mildly increased echogenicity. No hydronephrosis or focal mass identified. Left Kidney: Renal measurements: 11.2 x 6.3 x 6.2 cm = volume: 227 mL. Mildly increased echogenicity. No hydronephrosis or focal mass identified. Bladder: Appears normal for degree of bladder distention. Other: None. IMPRESSION: Increased echogenicity of the kidneys suggesting medical renal disease. No hydronephrosis. Electronically Signed   By: Ofilia Neas M.D.   On: 11/23/2021 10:39   DG HIP UNILAT WITH PELVIS 2-3 VIEWS RIGHT  Result Date: 11/16/2021 CLINICAL DATA:  Atraumatic right hip pain. EXAM: DG HIP (WITH OR WITHOUT PELVIS) 2-3V RIGHT COMPARISON:  None. FINDINGS: There is no evidence of hip fracture or dislocation.  There is no evidence of arthropathy or other focal bone abnormality. IMPRESSION: Negative. Electronically Signed   By: Virgina Norfolk M.D.   On: 11/27/2021 19:20    enoxaparin (LOVENOX) injection  40 mg Subcutaneous Q24H   furosemide  40 mg Intravenous BID   hydrALAZINE  25 mg Oral BID   insulin aspart  0-9 Units Subcutaneous TID WC   insulin aspart  5 Units Subcutaneous TID WC   insulin glargine-yfgn  30 Units Subcutaneous BID   liver oil-zinc oxide  1 application Topical N4B   nystatin  1 application Topical TID   pantoprazole  40 mg Oral Daily    BMET    Component Value Date/Time   NA 137 11/24/2021 0621   K 3.8 11/24/2021 0621   CL 102 11/24/2021 0621   CO2 23 11/24/2021 0621   GLUCOSE 210 (H) 11/24/2021 0621   BUN 32 (H) 11/24/2021 0621   CREATININE 2.24 (H) 11/24/2021 0621   CREATININE 0.69 07/08/2012 1203   CALCIUM 8.4 (L) 11/24/2021 0621   CALCIUM 9.2 07/13/2021 1416   GFRNONAA 30 (L) 11/24/2021 0621   GFRAA >60 08/26/2020 0507   CBC    Component Value Date/Time   WBC 8.7 11/24/2021 0621   RBC 3.00 (L) 11/24/2021 0621   HGB 8.7 (L) 11/24/2021 0621   HCT 28.3 (L) 11/24/2021 0621   PLT 338 11/24/2021 0621   MCV 94.3 11/24/2021 0621   MCH 29.0 11/24/2021 0621   MCHC 30.7 11/24/2021 0621   RDW 13.4 11/24/2021 0621   LYMPHSABS 1.5 11/03/2021 1544   MONOABS 0.6 11/08/2021 1544   EOSABS 0.4 11/05/2021 1544   BASOSABS 0.1 11/16/2021 1544     Assessment/Plan:  AKI/CKD stage IIIb - likely due to intravascular volume depletion due to anasarca and hyperglycemia.  Now with bump in Scr to 2.24 after IV lasix.  Difficult situation as she is overtly volume overloaded but intravascularly depleted.  IV ablumin started yesterday with increased UOP but worsening  AKI and tachycardia. Hold IV lasix for now given tachycardia and worsening AKI Will order urine studies Continue to follow UOP and Scr. avoid further nephrotoxins including NSAIDS, Morphine.  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  Hold lasix due to worsening AKI.   Hyperosmolar hyperglycemic syndrome - started on insulin drip on admission.  Now on sq insulin per primary HTN - stable Anemia of CKD stage IIIb - will check iron stores.  Transfuse if <7  Donetta Potts, MD [facility] [phone]

## 2021-11-24 NOTE — Progress Notes (Signed)
Inpatient Diabetes Program Recommendations  AACE/ADA: New Consensus Statement on Inpatient Glycemic Control (2015)  Target Ranges:  Prepandial:   less than 140 mg/dL      Peak postprandial:   less than 180 mg/dL (1-2 hours)      Critically ill patients:  140 - 180 mg/dL   Lab Results  Component Value Date   GLUCAP 372 (H) 11/24/2021   HGBA1C 15.2 (H) 11/23/2021    Review of Glycemic Control  Diabetes history: DM type 1 Outpatient Diabetes medications: Lantus 40 units bid, Apidra 10-16 units tid Current orders for Inpatient glycemic control:  Semglee 20 units bid Novolog 0-9 units tid Novolog 5 units tid meal coverage  Inpatient Diabetes Program Recommendations:    Glucose in 300's this am -  Increase Semglee 30 units bid  Pt see Endocrinology Dr. Dorris Fetch last visit in November 2nd. Pt has been seen recently by DM coordinator pt reports compliance to meds and glucose checks, also report compliance to diet, however we question this as A1c has increased from 13 to 15%. Per Endocrinology notes, she has an aid that helps her with her meds at home.  Thanks,  Tama Headings RN, MSN, BC-ADM Inpatient Diabetes Coordinator Team Pager [phone] (8a-5p)

## 2021-11-24 NOTE — Progress Notes (Signed)
°   11/23/21 2128  Assess: MEWS Score  Temp 98.5 F (36.9 C)  BP (!) 164/115  Pulse Rate (!) 122  Resp 18  SpO2 97 %  O2 Device Room Air  Assess: MEWS Score  MEWS Temp 0  MEWS Systolic 0  MEWS Pulse 2  MEWS RR 0  MEWS LOC 0  MEWS Score 2  MEWS Score Color Yellow  Assess: if the MEWS score is Yellow or Red  Were vital signs taken at a resting state? Yes  Focused Assessment No change from prior assessment  Early Detection of Sepsis Score *See Row Information* Low  MEWS guidelines implemented *See Row Information* Yes  Take Vital Signs  Increase Vital Sign Frequency  Yellow: Q 2hr X 2 then Q 4hr X 2, if remains yellow, continue Q 4hrs  Escalate  MEWS: Escalate Yellow: discuss with charge nurse/RN and consider discussing with provider and RRT  Notify: Charge Nurse/RN  Name of Charge Nurse/RN Notified Deeann Dowse RN  Date Charge Nurse/RN Notified 11/23/21  Time Charge Nurse/RN Notified 2128

## 2021-11-25 ENCOUNTER — Inpatient Hospital Stay (HOSPITAL_COMMUNITY): Payer: Medicaid Other

## 2021-11-25 LAB — CBC
HCT: 30.2 % — ABNORMAL LOW (ref 36.0–46.0)
Hemoglobin: 9.7 g/dL — ABNORMAL LOW (ref 12.0–15.0)
MCH: 30 pg (ref 26.0–34.0)
MCHC: 32.1 g/dL (ref 30.0–36.0)
MCV: 93.5 fL (ref 80.0–100.0)
Platelets: 410 10*3/uL — ABNORMAL HIGH (ref 150–400)
RBC: 3.23 MIL/uL — ABNORMAL LOW (ref 3.87–5.11)
RDW: 13.6 % (ref 11.5–15.5)
WBC: 9.5 10*3/uL (ref 4.0–10.5)
nRBC: 0 % (ref 0.0–0.2)

## 2021-11-25 LAB — RENAL FUNCTION PANEL
Albumin: 2.3 g/dL — ABNORMAL LOW (ref 3.5–5.0)
Anion gap: 8 (ref 5–15)
BUN: 33 mg/dL — ABNORMAL HIGH (ref 6–20)
CO2: 24 mmol/L (ref 22–32)
Calcium: 8.2 mg/dL — ABNORMAL LOW (ref 8.9–10.3)
Chloride: 104 mmol/L (ref 98–111)
Creatinine, Ser: 2.57 mg/dL — ABNORMAL HIGH (ref 0.44–1.00)
GFR, Estimated: 26 mL/min — ABNORMAL LOW (ref >60)
Glucose, Bld: 115 mg/dL — ABNORMAL HIGH (ref 70–99)
Phosphorus: 4.9 mg/dL — ABNORMAL HIGH (ref 2.5–4.6)
Potassium: 3.8 mmol/L (ref 3.5–5.1)
Sodium: 136 mmol/L (ref 135–145)

## 2021-11-25 LAB — GLUCOSE, CAPILLARY
Glucose-Capillary: 93 mg/dL (ref 70–99)
Glucose-Capillary: 85 mg/dL (ref 70–99)
Glucose-Capillary: 83 mg/dL (ref 70–99)
Glucose-Capillary: 285 mg/dL — ABNORMAL HIGH (ref 70–99)

## 2021-11-25 MED ORDER — FUROSEMIDE 10 MG/ML IJ SOLN
40.0000 mg | Freq: Every day | INTRAMUSCULAR | Status: DC
  Administered 2021-11-25 – 2021-11-26 (×2): 40 mg via INTRAVENOUS
  Filled 2021-11-25 (×2): qty 4

## 2021-11-25 MED ORDER — INSULIN GLARGINE-YFGN 100 UNIT/ML ~~LOC~~ SOLN
15.0000 [IU] | Freq: Two times a day (BID) | SUBCUTANEOUS | Status: DC
  Administered 2021-11-25 – 2021-11-29 (×9): 15 [IU] via SUBCUTANEOUS
  Filled 2021-11-25 (×12): qty 0.15

## 2021-11-25 MED ORDER — ALBUMIN HUMAN 25 % IV SOLN
25.0000 g | Freq: Four times a day (QID) | INTRAVENOUS | Status: DC
  Administered 2021-11-25 – 2021-11-26 (×5): 25 g via INTRAVENOUS
  Filled 2021-11-25 (×6): qty 100

## 2021-11-25 MED ORDER — ALBUMIN HUMAN 25 % IV SOLN
INTRAVENOUS | Status: AC
  Administered 2021-11-25: 12:00:00 25 g via INTRAVENOUS
  Filled 2021-11-25: qty 50

## 2021-11-25 NOTE — Progress Notes (Signed)
Patient ID: Diamond [name], female   DOB: [birth year], 25 y.o.   MRN: [id number] S: Not feeling well, still with marked anasarca despite diuresis and reports that she lost vision in her left eye last night. O:BP (!) 147/82 (BP Location: Right Arm)    Pulse 92    Temp 99 F (37.2 C) (Oral)    Resp 18    Ht 4' 11"  (1.499 m)    Wt 80.7 kg    SpO2 97%    BMI 35.95 kg/m   Intake/Output Summary (Last 24 hours) at 11/25/2021 1036 Last data filed at 11/24/2021 1500 Gross per 24 hour  Intake 760 ml  Output 3000 ml  Net -2240 ml   Intake/Output: I/O last 3 completed shifts: In: 1480 [P.O.:1480] Out: 3500 [Urine:3500]  Intake/Output this shift:  No intake/output data recorded. Weight change:  Gen:NAD CVS:RRR Resp:CTA Abd: +BS, soft, NT/ND Ext: 2+ anasarca involving face, arms, abdomen and legs.  Recent Labs  Lab 11/10/2021 1544 11/13/2021 2153 11/23/21 0204 11/23/21 0618 11/23/21 1148 11/23/21 1436 11/24/21 0621 11/25/21 0447  NA 131* 134* 136 134* 136 130* 137 136  K 4.7 3.8 3.7 3.7 4.0 4.0 3.8 3.8  CL 100 103 103 103 104 100 102 104  CO2 24 25 26 26 25 24 23 24   GLUCOSE 564* 348* 181* 124* 118* 182* 210* 115*  BUN 22* 23* 24* 24* 25* 25* 32* 33*  CREATININE 1.37* 1.34* 1.52* 1.61* 1.67* 1.73* 2.24* 2.57*  ALBUMIN 1.8*  --   --   --   --   --  3.1* 2.3*  CALCIUM 8.0* 7.9* 7.9* 7.8* 7.9* 7.7* 8.4* 8.2*  PHOS  --   --   --   --   --   --   --  4.9*  AST 16  --   --   --   --   --  26  --   ALT 19  --   --   --   --   --  19  --    Liver Function Tests: Recent Labs  Lab 11/26/2021 1544 11/24/21 0621 11/25/21 0447  AST 16 26  --   ALT 19 19  --   ALKPHOS 124 86  --   BILITOT 0.3 0.6  --   PROT 6.1* 6.5  --   ALBUMIN 1.8* 3.1* 2.3*   No results for input(s): LIPASE, AMYLASE in the last 168 hours. No results for input(s): AMMONIA in the last 168 hours. CBC: Recent Labs  Lab 11/27/2021 1544 11/23/21 0618 11/24/21 0621 11/25/21 0447  WBC 8.3 9.3 8.7 9.5  NEUTROABS 5.8  --    --   --   HGB 10.6* 8.9* 8.7* 9.7*  HCT 34.0* 27.6* 28.3* 30.2*  MCV 95.2 92.0 94.3 93.5  PLT 274 327 338 410*   Cardiac Enzymes: No results for input(s): CKTOTAL, CKMB, CKMBINDEX, TROPONINI in the last 168 hours. CBG: Recent Labs  Lab 11/24/21 1130 11/24/21 1459 11/24/21 1632 11/24/21 2119 11/25/21 0713  GLUCAP 63* 112* 72 211* 93    Iron Studies:  Recent Labs    11/24/21 1039  IRON 48  TIBC 185*  FERRITIN 67   Studies/Results: No results found.  enoxaparin (LOVENOX) injection  40 mg Subcutaneous Q24H   hydrALAZINE  25 mg Oral BID   insulin aspart  0-9 Units Subcutaneous TID WC   insulin aspart  5 Units Subcutaneous TID WC   insulin glargine-yfgn  20 Units Subcutaneous BID  nystatin  1 application Topical TID   pantoprazole  40 mg Oral Daily    BMET    Component Value Date/Time   NA 136 11/25/2021 0447   K 3.8 11/25/2021 0447   CL 104 11/25/2021 0447   CO2 24 11/25/2021 0447   GLUCOSE 115 (H) 11/25/2021 0447   BUN 33 (H) 11/25/2021 0447   CREATININE 2.57 (H) 11/25/2021 0447   CREATININE 0.69 07/08/2012 1203   CALCIUM 8.2 (L) 11/25/2021 0447   CALCIUM 9.2 07/13/2021 1416   GFRNONAA 26 (L) 11/25/2021 0447   GFRAA >60 08/26/2020 0507   CBC    Component Value Date/Time   WBC 9.5 11/25/2021 0447   RBC 3.23 (L) 11/25/2021 0447   HGB 9.7 (L) 11/25/2021 0447   HCT 30.2 (L) 11/25/2021 0447   PLT 410 (H) 11/25/2021 0447   MCV 93.5 11/25/2021 0447   MCH 30.0 11/25/2021 0447   MCHC 32.1 11/25/2021 0447   RDW 13.6 11/25/2021 0447   LYMPHSABS 1.5 11/07/2021 1544   MONOABS 0.6 11/29/2021 1544   EOSABS 0.4 11/27/2021 1544   BASOSABS 0.1 11/13/2021 1544    Assessment/Plan:   AKI/CKD stage IIIb - likely due to intravascular volume depletion due to anasarca and hyperglycemia.  Now with bump in Scr to 2.24 after IV lasix.  Difficult situation as she is overtly volume overloaded but intravascularly depleted.  IV ablumin started yesterday with increased UOP but  worsening  AKI and tachycardia. HR improved and net negative 1.5 liters after one dose of IV lasix yesterday. Scr appears to have reached a plateau and will give IV albumin and IV lasix today and follow renal response.  Will order urine studies Continue to follow UOP and Scr. avoid further nephrotoxins including NSAIDS, Morphine.  Unless absolutely necessary, avoid CT with contrast  No indication for dialysis at this time. 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  Ophtho consult if possible.  Hyperosmolar hyperglycemic syndrome - started on insulin drip on admission.  Now on sq insulin per primary HTN - stable Anemia of CKD stage IIIb - will check iron stores.  Transfuse if <7  Donetta Potts, MD [facility] [phone]  Due to holiday, pt will not be seen tomorrow but labs and orders will be reviewed remotely.

## 2021-11-25 NOTE — Progress Notes (Signed)
Have requested to DC telemetry orders. Patient is noncompliant with keeping telemetry leads on. Staff is in and out of her room placing leads on her only to find them on the bedside table the next time they come in the room.

## 2021-11-25 NOTE — Progress Notes (Signed)
PROGRESS NOTE    Diamond [name]  GMW:[id number] DOB: 1996-05-16 DOA: 11/05/2021 PCP: [name], PA-C    Brief Narrative:  25 year old female with a history of type 1 diabetes, nephrotic syndrome, presents to the hospital with uncontrolled blood sugars and significant volume overload/anasarca.  She does report compliance with her home diabetes medications as well as diuretics.  She was started on insulin infusion with improvement of blood sugars.  Also started on IV diuretics.  Nephrology following.   Assessment & Plan:   Principal Problem:   Hyperosmolar hyperglycemic state (HHS) ([place]) Active Problems:   Anasarca   Pressure injury of skin   Type 1 diabetes, uncontrolled with hyperglycemia -Started on insulin infusion on admission -Noted to have normal anion gap -Overall blood sugars have improved -She has been transitioned to subcutaneous insulin -Blood sugars are running low today -We will reduce basal insulin and discontinue meal coverage NovoLog.  Continue sliding scale.  Anasarca/volume overload in the setting of nephrotic syndrome -Patient reports compliance with diuretics at home -Weight appears to be approximately 30 pounds -She was started on Lasix and albumin infusions -Creatinine trending up with diuretics -Nephrology following, appreciate input -Restarted on Lasix and albumin infusions today.  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 -Renal ultrasound unremarkable -Restarted on albumin and IV Lasix -Continue to follow  Hypertension -Holding ACE inhibitor since creatinine appears to be trending up -She is continued on hydralazine  Right hip pain -X-ray unremarkable  Anemia -Likely related to chronic kidney disease -No reported bleeding -Continue to monitor  Vision loss, left eye -Patient  reports that she has had progressive worsening of vision in her left eye for the past 2 to 3 weeks -Has felt that there is blood in her eye that is currently mobile but has progressively increased over the past few weeks -She was seen at [facility] twice in the past 2 weeks.  On second visit, there was concern for possible vitreous hemorrhage.  CT orbits done at that time did not show any acute findings -She has been unable to follow-up with a ophthalmologist -Feels that her vision has gotten worse last night to the point that she has difficulty making eye shapes with her left eye -Also complains of headache in her temples and occipital area which has been persistent for the last few days -CT head repeated that did not show any acute findings -Reviewed case with ophthalmology on-call, Dr. Lucianne Lei who felt that patient's symptoms likely related to vitreous hemorrhage.  Since symptoms have been progressive over the last 2 to 3 weeks, no urgent intervention is indicated at this time, although she will need to follow-up with ophthalmology when she is medically stable for discharge -Hemorrhage likely related to neovascularization and uncontrolled diabetes -This will likely need to be treated with photocoagulation in the office and cannot be managed as an inpatient.   DVT prophylaxis: enoxaparin (LOVENOX) injection 40 mg Start: 11/12/2021 2000 SCDs Start: 11/14/2021 1941  Code Status: Full code Family Communication: Discussed with patient, updated her on who is her guardian Disposition Plan: Status is: Inpatient  Remains inpatient appropriate because: Continued inpatient stay for IV diuresis and monitoring renal function       Consultants:  Nephrology  Procedures:    Antimicrobials:      Subjective: Patient reports continued general abdominal pain.  She does report having  headache for the last several days which is mostly in occipital area and in her temples bilaterally.  She also  describes worsening vision.  She reports having blurry vision chronically in her right eye which is currently at baseline.  She has had worsening blurry vision in her left eye over the past 2 to 3 weeks.  She has been seen at [facility] twice for this in the past 2 weeks.  She also describes seen blood in her left line of vision.  This collection of blood is mobile and appears to have gotten worse over the last several weeks.  Last night, she reports that the vision in her left eye had substantially gotten worse and now she has difficulty making out shapes with her left eye.  This change was not reported to any provider last night.  Objective: Vitals:   11/24/21 1213 11/24/21 2050 11/25/21 0415 11/25/21 1428  BP: (!) 150/98 (!) 163/122 (!) 147/82 (!) 144/107  Pulse: (!) 118 (!) 110 92 (!) 107  Resp: 16 18  17   Temp: 98.6 F (37 C) 99 F (37.2 C)  98.6 F (37 C)  TempSrc: Oral Oral  Oral  SpO2: 96% 98% 97% 100%  Weight:      Height:        Intake/Output Summary (Last 24 hours) at 11/25/2021 2014 Last data filed at 11/25/2021 1743 Gross per 24 hour  Intake 1210.26 ml  Output 2000 ml  Net -789.74 ml   Filed Weights   11/06/2021 1443  Weight: 80.7 kg    Examination:  General exam: Appears calm and comfortable  HEENT: Decreased visual acuity in left eye.  No conjunctival erythema.  Pupil is mildly dilated as compared to the right and is not as reactive to light Respiratory system: Clear to auscultation. Respiratory effort normal. Cardiovascular system: S1 & S2 heard, RRR. No JVD, murmurs, rubs, gallops or clicks.  Gastrointestinal system: Abdomen is nondistended, soft and nontender. No organomegaly or masses felt. Normal bowel sounds heard. Central nervous system: Alert and oriented. No focal neurological deficits. Extremities: Anasarca present Skin: No rashes, lesions or ulcers Psychiatry: Judgement and insight appear normal. Mood & affect appropriate.     Data Reviewed: I  have personally reviewed following labs and imaging studies  CBC: Recent Labs  Lab 12/01/2021 1544 11/23/21 0618 11/24/21 0621 11/25/21 0447  WBC 8.3 9.3 8.7 9.5  NEUTROABS 5.8  --   --   --   HGB 10.6* 8.9* 8.7* 9.7*  HCT 34.0* 27.6* 28.3* 30.2*  MCV 95.2 92.0 94.3 93.5  PLT 274 327 338 379*   Basic Metabolic Panel: Recent Labs  Lab 11/23/21 0618 11/23/21 1148 11/23/21 1436 11/24/21 0621 11/25/21 0447  NA 134* 136 130* 137 136  K 3.7 4.0 4.0 3.8 3.8  CL 103 104 100 102 104  CO2 26 25 24 23 24   GLUCOSE 124* 118* 182* 210* 115*  BUN 24* 25* 25* 32* 33*  CREATININE 1.61* 1.67* 1.73* 2.24* 2.57*  CALCIUM 7.8* 7.9* 7.7* 8.4* 8.2*  PHOS  --   --   --   --  4.9*   GFR: Estimated Creatinine Clearance: 30.7 mL/min (A) (by C-G formula based on SCr of 2.57 mg/dL (H)). Liver Function Tests: Recent Labs  Lab 11/05/2021 1544 11/24/21 0621 11/25/21 0447  AST 16 26  --   ALT 19 19  --   ALKPHOS 124 86  --   BILITOT 0.3 0.6  --   PROT 6.1* 6.5  --  ALBUMIN 1.8* 3.1* 2.3*   No results for input(s): LIPASE, AMYLASE in the last 168 hours. No results for input(s): AMMONIA in the last 168 hours. Coagulation Profile: No results for input(s): INR, PROTIME in the last 168 hours. Cardiac Enzymes: No results for input(s): CKTOTAL, CKMB, CKMBINDEX, TROPONINI in the last 168 hours. BNP (last 3 results) No results for input(s): PROBNP in the last 8760 hours. HbA1C: Recent Labs    11/23/21 0618  HGBA1C 15.2*   CBG: Recent Labs  Lab 11/24/21 1632 11/24/21 2119 11/25/21 0713 11/25/21 1107 11/25/21 1608  GLUCAP 72 211* 93 85 83   Lipid Profile: No results for input(s): CHOL, HDL, LDLCALC, TRIG, CHOLHDL, LDLDIRECT in the last 72 hours. Thyroid Function Tests: No results for input(s): TSH, T4TOTAL, FREET4, T3FREE, THYROIDAB in the last 72 hours. Anemia Panel: Recent Labs    11/24/21 1039  VITAMINB12 278  FERRITIN 67  TIBC 185*  IRON 48   Sepsis Labs: No results for  input(s): PROCALCITON, LATICACIDVEN in the last 168 hours.  Recent Results (from the past 240 hour(s))  Resp Panel by RT-PCR (Flu A&B, Covid) Nasopharyngeal Swab     Status: None   Collection Time: 11/20/2021  5:24 PM   Specimen: Nasopharyngeal Swab; Nasopharyngeal(NP) swabs in vial transport medium  Result Value Ref Range Status   SARS Coronavirus 2 by RT PCR NEGATIVE NEGATIVE Final    Comment: (NOTE) SARS-CoV-2 target nucleic acids are NOT DETECTED.  The SARS-CoV-2 RNA is generally detectable in upper respiratory specimens during the acute phase of infection. The lowest concentration of SARS-CoV-2 viral copies this assay can detect is 138 copies/mL. A negative result does not preclude SARS-Cov-2 infection and should not be used as the sole basis for treatment or other patient management decisions. A negative result may occur with  improper specimen collection/handling, submission of specimen other than nasopharyngeal swab, presence of viral mutation(s) within the areas targeted by this assay, and inadequate number of viral copies(<138 copies/mL). A negative result must be combined with clinical observations, patient history, and epidemiological information. The expected result is Negative.  Fact Sheet for Patients:  EntrepreneurPulse.com.au  Fact Sheet for Healthcare Providers:  IncredibleEmployment.be  This test is no t yet approved or cleared by the [place] FDA and  has been authorized for detection and/or diagnosis of SARS-CoV-2 by FDA under an Emergency Use Authorization (EUA). This EUA will remain  in effect (meaning this test can be used) for the duration of the COVID-19 declaration under Section 564(b)(1) of the Act, 21 U.S.C.section 360bbb-3(b)(1), unless the authorization is terminated  or revoked sooner.       Influenza A by PCR NEGATIVE NEGATIVE Final   Influenza B by PCR NEGATIVE NEGATIVE Final    Comment: (NOTE) The  Xpert Xpress SARS-CoV-2/FLU/RSV plus assay is intended as an aid in the diagnosis of influenza from Nasopharyngeal swab specimens and should not be used as a sole basis for treatment. Nasal washings and aspirates are unacceptable for Xpert Xpress SARS-CoV-2/FLU/RSV testing.  Fact Sheet for Patients: EntrepreneurPulse.com.au  Fact Sheet for Healthcare Providers: IncredibleEmployment.be  This test is not yet approved or cleared by the [place] FDA and has been authorized for detection and/or diagnosis of SARS-CoV-2 by FDA under an Emergency Use Authorization (EUA). This EUA will remain in effect (meaning this test can be used) for the duration of the COVID-19 declaration under Section 564(b)(1) of the Act, 21 U.S.C. section 360bbb-3(b)(1), unless the authorization is terminated or revoked.  Performed at [facility]  [facility], [address], [place], [place] [address]          Radiology Studies: CT HEAD WO CONTRAST (5MM)  Result Date: 11/25/2021 CLINICAL DATA:  Monocular vision loss EXAM: CT HEAD WITHOUT CONTRAST TECHNIQUE: Contiguous axial images were obtained from the base of the skull through the vertex without intravenous contrast. COMPARISON:  11/21/2021.  09/06/2021. FINDINGS: Brain: The brain shows a normal appearance without evidence of malformation, atrophy, old or acute small or large vessel infarction, mass lesion, hemorrhage, hydrocephalus or extra-axial collection. Vascular: No hyperdense vessel. No evidence of atherosclerotic calcification. Skull: Normal.  No traumatic finding.  No focal bone lesion. Sinuses/Orbits: Sinuses are clear. Orbits appear normal. Mastoids are clear. Other: None significant IMPRESSION: No change from previous examinations.  Normal head CT. Electronically Signed   By: Nelson Chimes M.D.   On: 11/25/2021 13:47        Scheduled Meds:  enoxaparin (LOVENOX) injection  40 mg Subcutaneous Q24H   furosemide  40 mg  Intravenous Daily   hydrALAZINE  25 mg Oral BID   insulin aspart  0-9 Units Subcutaneous TID WC   insulin glargine-yfgn  15 Units Subcutaneous BID   nystatin  1 application Topical TID   pantoprazole  40 mg Oral Daily   Continuous Infusions:  albumin human 25 g (11/25/21 1846)   dextrose 5% lactated ringers Stopped (11/23/21 0635)     LOS: 3 days    Time spent: 50mns    JKathie Dike MD [facility]   If 7PM-7AM, please contact night-coverage [email or web address]  11/25/2021, 8:14 PM

## 2021-11-25 NOTE — Progress Notes (Signed)
During 4AM rounds patient seen sitting on bedside commode, next to her bed. I asked patient if she was ok and she stated that she wanted pain medication for a headache because she fell. I asked patient when and where she fell and she stated "a little while ago, before getting to the commode". I asked patient if she hit anything and she said that she hit her head on the mattress. Patiens vital signs stable, patient given Tylenol for her head and covering doctor made aware. Will continue to monitor.

## 2021-11-25 NOTE — Progress Notes (Signed)
Pt has been having low glucose readings so novalog wasn't given. MD is aware. MD also advised to give semglee because pt is type 1 diabetic. Will continue to monitor pt.

## 2021-11-26 LAB — GLUCOSE, CAPILLARY
Glucose-Capillary: 202 mg/dL — ABNORMAL HIGH (ref 70–99)
Glucose-Capillary: 171 mg/dL — ABNORMAL HIGH (ref 70–99)
Glucose-Capillary: 157 mg/dL — ABNORMAL HIGH (ref 70–99)
Glucose-Capillary: 179 mg/dL — ABNORMAL HIGH (ref 70–99)
Glucose-Capillary: 153 mg/dL — ABNORMAL HIGH (ref 70–99)
Glucose-Capillary: 195 mg/dL — ABNORMAL HIGH (ref 70–99)

## 2021-11-26 LAB — CBC
HCT: 24.9 % — ABNORMAL LOW (ref 36.0–46.0)
Hemoglobin: 7.8 g/dL — ABNORMAL LOW (ref 12.0–15.0)
MCH: 28.9 pg (ref 26.0–34.0)
MCHC: 31.3 g/dL (ref 30.0–36.0)
MCV: 92.2 fL (ref 80.0–100.0)
Platelets: 356 10*3/uL (ref 150–400)
RBC: 2.7 MIL/uL — ABNORMAL LOW (ref 3.87–5.11)
RDW: 13.5 % (ref 11.5–15.5)
WBC: 8.2 10*3/uL (ref 4.0–10.5)
nRBC: 0 % (ref 0.0–0.2)

## 2021-11-26 LAB — RENAL FUNCTION PANEL
Albumin: 2.8 g/dL — ABNORMAL LOW (ref 3.5–5.0)
Anion gap: 11 (ref 5–15)
BUN: 37 mg/dL — ABNORMAL HIGH (ref 6–20)
CO2: 24 mmol/L (ref 22–32)
Calcium: 8.5 mg/dL — ABNORMAL LOW (ref 8.9–10.3)
Chloride: 104 mmol/L (ref 98–111)
Creatinine, Ser: 2.07 mg/dL — ABNORMAL HIGH (ref 0.44–1.00)
GFR, Estimated: 33 mL/min — ABNORMAL LOW (ref >60)
Glucose, Bld: 217 mg/dL — ABNORMAL HIGH (ref 70–99)
Phosphorus: 4.5 mg/dL (ref 2.5–4.6)
Potassium: 4.1 mmol/L (ref 3.5–5.1)
Sodium: 139 mmol/L (ref 135–145)

## 2021-11-26 NOTE — Progress Notes (Signed)
Labs/chart reviewed. Cr improved, down to 2.1 today. UOP 3.6L. Recommend continuing lasix with albumin for today. Consider increasing her lasix to BID to help offload more volume. Check strict I/O and should have daily weights checked.  Gean Quint, MD [facility]

## 2021-11-26 NOTE — Progress Notes (Addendum)
PROGRESS NOTE    Diamond [name]  TTS:[id number] DOB: [birth year] DOA: 11/07/2021 PCP: [name], PA-C    Brief Narrative:  25 year old female with a history of type 1 diabetes, nephrotic syndrome, presents to the hospital with uncontrolled blood sugars and significant volume overload/anasarca.  She does report compliance with her home diabetes medications as well as diuretics.  She was started on insulin infusion with improvement of blood sugars.  Also started on IV diuretics.  Nephrology following.   Assessment & Plan:   Principal Problem:   Hyperosmolar hyperglycemic state (HHS) ([place]) Active Problems:   Anasarca   Pressure injury of skin   Type 1 diabetes, uncontrolled with hyperglycemia -Started on insulin infusion on admission -Noted to have normal anion gap -Overall blood sugars have improved -She has been transitioned to subcutaneous insulin -Blood sugars currently stable  Anasarca/volume overload in the setting of nephrotic syndrome -Patient reports compliance with diuretics at home -Weight appears to be approximately 30 pounds -daily weights requested -She was started on Lasix and albumin infusions -Nephrology following, appreciate input -Urine output improving with approx 3.6L of urine out yesterday -will continue current management  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 -Renal ultrasound unremarkable -Restarted on albumin and IV Lasix -creatinine better today at 2.0 -Continue to follow  Hypertension -Holding ACE inhibitor since creatinine appears to be trending up -She is continued on hydralazine  Right hip pain -X-ray unremarkable  Anemia -Likely related to chronic kidney disease -No reported bleeding -Continue to monitor  Vision loss, left eye -Patient reports that she has had  progressive worsening of vision in her left eye for the past 2 to 3 weeks -Has felt that there is blood in her eye that is currently mobile but has progressively increased over the past few weeks -She was seen at [facility] twice in the past 2 weeks.  On second visit, there was concern for possible vitreous hemorrhage.  CT orbits done at that time did not show any acute findings -She has been unable to follow-up with a ophthalmologist -Feels that her vision has gotten worse last night to the point that she has difficulty making eye shapes with her left eye -Also complains of headache in her temples and occipital area which has been persistent for the last few days -CT head repeated that did not show any acute findings -Reviewed case with ophthalmology on-call, Dr. Lucianne Lei who felt that patient's symptoms likely related to vitreous hemorrhage.  Since symptoms have been progressive over the last 2 to 3 weeks, no urgent intervention is indicated at this time, although she will need to follow-up with ophthalmology when she is medically stable for discharge -Hemorrhage likely related to neovascularization and uncontrolled diabetes -This will likely need to be treated with photocoagulation in the office and cannot be managed as an inpatient.  Pressure injury, left lateral thigh, stage 1, POA Pressure Injury 11/23/21 Thigh Left;Lateral Stage 1 -  Intact skin with non-blanchable redness of a localized area usually over a bony prominence. wound bed, pink with outer ridges intact. (Active)  11/23/21 1058  Location: Thigh  Location Orientation: Left;Lateral  Staging: Stage 1 -  Intact skin with non-blanchable redness of a localized area usually over a bony prominence.  Wound Description (Comments): wound bed, pink with outer ridges intact.  Present on Admission: Yes  -continue supportive measures.  DVT prophylaxis: enoxaparin (LOVENOX) injection 40 mg Start: 11/13/2021 2000 SCDs Start: 12/02/2021  1941  Code Status: Full code Family Communication: Discussed with patient, updated her aunt who is her guardian Disposition Plan: Status is: Inpatient  Remains inpatient appropriate because: Continued inpatient stay for IV diuresis and monitoring renal function       Consultants:  Nephrology  Procedures:    Antimicrobials:      Subjective: Complains of general body pain from edema. Urine output better over last 24 hours  Objective: Vitals:   11/25/21 1428 11/25/21 2158 11/26/21 0421 11/26/21 1436  BP: (!) 144/107 126/80 (!) 156/99 127/90  Pulse: (!) 107 (!) 109 (!) 110 (!) 110  Resp: 17 18 18 20   Temp: 98.6 F (37 C) 98.7 F (37.1 C) 98.8 F (37.1 C) 98.4 F (36.9 C)  TempSrc: Oral   Oral  SpO2: 100% 95% 96% 97%  Weight:      Height:        Intake/Output Summary (Last 24 hours) at 11/26/2021 1639 Last data filed at 11/26/2021 1437 Gross per 24 hour  Intake 1680 ml  Output 5700 ml  Net -4020 ml   Filed Weights   11/10/2021 1443  Weight: 80.7 kg    Examination:  General exam: Appears calm and comfortable  HEENT: Decreased visual acuity in left eye.  No conjunctival erythema.  Pupil is mildly dilated as compared to the right and is not as reactive to light Respiratory system: Clear to auscultation. Respiratory effort normal. Cardiovascular system: S1 & S2 heard, RRR. No JVD, murmurs, rubs, gallops or clicks.  Gastrointestinal system: Abdomen is nondistended, soft and nontender. No organomegaly or masses felt. Normal bowel sounds heard. Central nervous system: Alert and oriented. No focal neurological deficits. Extremities: Anasarca present Skin: No rashes, lesions or ulcers Psychiatry: Judgement and insight appear normal. Mood & affect appropriate.     Data Reviewed: I have personally reviewed following labs and imaging studies  CBC: Recent Labs  Lab 12/01/2021 1544 11/23/21 0618 11/24/21 0621 11/25/21 0447 11/26/21 0421  WBC 8.3 9.3 8.7 9.5 8.2   NEUTROABS 5.8  --   --   --   --   HGB 10.6* 8.9* 8.7* 9.7* 7.8*  HCT 34.0* 27.6* 28.3* 30.2* 24.9*  MCV 95.2 92.0 94.3 93.5 92.2  PLT 274 327 338 410* 076   Basic Metabolic Panel: Recent Labs  Lab 11/23/21 1148 11/23/21 1436 11/24/21 0621 11/25/21 0447 11/26/21 0421  NA 136 130* 137 136 139  K 4.0 4.0 3.8 3.8 4.1  CL 104 100 102 104 104  CO2 25 24 23 24 24   GLUCOSE 118* 182* 210* 115* 217*  BUN 25* 25* 32* 33* 37*  CREATININE 1.67* 1.73* 2.24* 2.57* 2.07*  CALCIUM 7.9* 7.7* 8.4* 8.2* 8.5*  PHOS  --   --   --  4.9* 4.5   GFR: Estimated Creatinine Clearance: 38.2 mL/min (A) (by C-G formula based on SCr of 2.07 mg/dL (H)). Liver Function Tests: Recent Labs  Lab 11/21/2021 1544 11/24/21 0621 11/25/21 0447 11/26/21 0421  AST 16 26  --   --   ALT 19 19  --   --   ALKPHOS 124 86  --   --   BILITOT 0.3 0.6  --   --   PROT 6.1* 6.5  --   --   ALBUMIN 1.8* 3.1* 2.3* 2.8*   No results for input(s): LIPASE, AMYLASE in the last 168 hours. No results for input(s): AMMONIA in the  last 168 hours. Coagulation Profile: No results for input(s): INR, PROTIME in the last 168 hours. Cardiac Enzymes: No results for input(s): CKTOTAL, CKMB, CKMBINDEX, TROPONINI in the last 168 hours. BNP (last 3 results) No results for input(s): PROBNP in the last 8760 hours. HbA1C: No results for input(s): HGBA1C in the last 72 hours.  CBG: Recent Labs  Lab 11/26/21 0412 11/26/21 0606 11/26/21 0725 11/26/21 1107 11/26/21 1605  GLUCAP 202* 171* 157* 179* 153*   Lipid Profile: No results for input(s): CHOL, HDL, LDLCALC, TRIG, CHOLHDL, LDLDIRECT in the last 72 hours. Thyroid Function Tests: No results for input(s): TSH, T4TOTAL, FREET4, T3FREE, THYROIDAB in the last 72 hours. Anemia Panel: Recent Labs    11/24/21 1039  VITAMINB12 278  FERRITIN 67  TIBC 185*  IRON 48   Sepsis Labs: No results for input(s): PROCALCITON, LATICACIDVEN in the last 168 hours.  Recent Results (from the  past 240 hour(s))  Resp Panel by RT-PCR (Flu A&B, Covid) Nasopharyngeal Swab     Status: None   Collection Time: 11/27/2021  5:24 PM   Specimen: Nasopharyngeal Swab; Nasopharyngeal(NP) swabs in vial transport medium  Result Value Ref Range Status   SARS Coronavirus 2 by RT PCR NEGATIVE NEGATIVE Final    Comment: (NOTE) SARS-CoV-2 target nucleic acids are NOT DETECTED.  The SARS-CoV-2 RNA is generally detectable in upper respiratory specimens during the acute phase of infection. The lowest concentration of SARS-CoV-2 viral copies this assay can detect is 138 copies/mL. A negative result does not preclude SARS-Cov-2 infection and should not be used as the sole basis for treatment or other patient management decisions. A negative result may occur with  improper specimen collection/handling, submission of specimen other than nasopharyngeal swab, presence of viral mutation(s) within the areas targeted by this assay, and inadequate number of viral copies(<138 copies/mL). A negative result must be combined with clinical observations, patient history, and epidemiological information. The expected result is Negative.  Fact Sheet for Patients:  EntrepreneurPulse.com.au  Fact Sheet for Healthcare Providers:  IncredibleEmployment.be  This test is no t yet approved or cleared by the [place] FDA and  has been authorized for detection and/or diagnosis of SARS-CoV-2 by FDA under an Emergency Use Authorization (EUA). This EUA will remain  in effect (meaning this test can be used) for the duration of the COVID-19 declaration under Section 564(b)(1) of the Act, 21 U.S.C.section 360bbb-3(b)(1), unless the authorization is terminated  or revoked sooner.       Influenza A by PCR NEGATIVE NEGATIVE Final   Influenza B by PCR NEGATIVE NEGATIVE Final    Comment: (NOTE) The Xpert Xpress SARS-CoV-2/FLU/RSV plus assay is intended as an aid in the diagnosis of  influenza from Nasopharyngeal swab specimens and should not be used as a sole basis for treatment. Nasal washings and aspirates are unacceptable for Xpert Xpress SARS-CoV-2/FLU/RSV testing.  Fact Sheet for Patients: EntrepreneurPulse.com.au  Fact Sheet for Healthcare Providers: IncredibleEmployment.be  This test is not yet approved or cleared by the [place] FDA and has been authorized for detection and/or diagnosis of SARS-CoV-2 by FDA under an Emergency Use Authorization (EUA). This EUA will remain in effect (meaning this test can be used) for the duration of the COVID-19 declaration under Section 564(b)(1) of the Act, 21 U.S.C. section 360bbb-3(b)(1), unless the authorization is terminated or revoked.  Performed at [facility], [address], [place], [place] [address]          Radiology Studies: CT HEAD WO CONTRAST (5MM)  Result Date: 11/25/2021 CLINICAL DATA:  Monocular vision loss EXAM: CT HEAD WITHOUT CONTRAST TECHNIQUE: Contiguous axial images were obtained from the base of the skull through the vertex without intravenous contrast. COMPARISON:  11/21/2021.  09/06/2021. FINDINGS: Brain: The brain shows a normal appearance without evidence of malformation, atrophy, old or acute small or large vessel infarction, mass lesion, hemorrhage, hydrocephalus or extra-axial collection. Vascular: No hyperdense vessel. No evidence of atherosclerotic calcification. Skull: Normal.  No traumatic finding.  No focal bone lesion. Sinuses/Orbits: Sinuses are clear. Orbits appear normal. Mastoids are clear. Other: None significant IMPRESSION: No change from previous examinations.  Normal head CT. Electronically Signed   By: Nelson Chimes M.D.   On: 11/25/2021 13:47        Scheduled Meds:  enoxaparin (LOVENOX) injection  40 mg Subcutaneous Q24H   furosemide  40 mg Intravenous Daily   hydrALAZINE  25 mg Oral BID   insulin aspart  0-9 Units Subcutaneous  TID WC   insulin glargine-yfgn  15 Units Subcutaneous BID   nystatin  1 application Topical TID   pantoprazole  40 mg Oral Daily   Continuous Infusions:  albumin human 25 g (11/26/21 1248)   dextrose 5% lactated ringers 50 mL/hr at 11/26/21 1629     LOS: 4 days    Time spent: 67mns    JKathie Dike MD [facility]   If 7PM-7AM, please contact night-coverage [email or web address]  11/26/2021, 4:39 PM

## 2021-11-27 LAB — CBC
HCT: 24.8 % — ABNORMAL LOW (ref 36.0–46.0)
Hemoglobin: 7.7 g/dL — ABNORMAL LOW (ref 12.0–15.0)
MCH: 29.6 pg (ref 26.0–34.0)
MCHC: 31 g/dL (ref 30.0–36.0)
MCV: 95.4 fL (ref 80.0–100.0)
Platelets: 376 10*3/uL (ref 150–400)
RBC: 2.6 MIL/uL — ABNORMAL LOW (ref 3.87–5.11)
RDW: 14 % (ref 11.5–15.5)
WBC: 8.3 10*3/uL (ref 4.0–10.5)
nRBC: 0 % (ref 0.0–0.2)

## 2021-11-27 LAB — GLUCOSE, CAPILLARY
Glucose-Capillary: 197 mg/dL — ABNORMAL HIGH (ref 70–99)
Glucose-Capillary: 221 mg/dL — ABNORMAL HIGH (ref 70–99)
Glucose-Capillary: 202 mg/dL — ABNORMAL HIGH (ref 70–99)
Glucose-Capillary: 268 mg/dL — ABNORMAL HIGH (ref 70–99)

## 2021-11-27 LAB — RENAL FUNCTION PANEL
Albumin: 2.6 g/dL — ABNORMAL LOW (ref 3.5–5.0)
Anion gap: 12 (ref 5–15)
BUN: 35 mg/dL — ABNORMAL HIGH (ref 6–20)
CO2: 25 mmol/L (ref 22–32)
Calcium: 8.6 mg/dL — ABNORMAL LOW (ref 8.9–10.3)
Chloride: 100 mmol/L (ref 98–111)
Creatinine, Ser: 1.88 mg/dL — ABNORMAL HIGH (ref 0.44–1.00)
GFR, Estimated: 38 mL/min — ABNORMAL LOW (ref >60)
Glucose, Bld: 217 mg/dL — ABNORMAL HIGH (ref 70–99)
Phosphorus: 4.7 mg/dL — ABNORMAL HIGH (ref 2.5–4.6)
Potassium: 4 mmol/L (ref 3.5–5.1)
Sodium: 137 mmol/L (ref 135–145)

## 2021-11-27 MED ORDER — FUROSEMIDE 10 MG/ML IJ SOLN
40.0000 mg | Freq: Once | INTRAMUSCULAR | Status: AC
  Administered 2021-11-27: 18:00:00 40 mg via INTRAVENOUS
  Filled 2021-11-27: qty 4

## 2021-11-27 MED ORDER — HYDROCODONE-ACETAMINOPHEN 5-325 MG PO TABS
1.0000 | ORAL_TABLET | Freq: Four times a day (QID) | ORAL | Status: DC | PRN
  Administered 2021-11-27 (×2): 1 via ORAL
  Filled 2021-11-27 (×2): qty 1

## 2021-11-27 MED ORDER — FUROSEMIDE 10 MG/ML IJ SOLN: 40.0000 mg | Freq: Once | INTRAMUSCULAR | Status: DC

## 2021-11-27 MED ORDER — ALBUMIN HUMAN 25 % IV SOLN
12.5000 g | Freq: Once | INTRAVENOUS | Status: AC
  Administered 2021-11-27: 20:00:00 12.5 g via INTRAVENOUS
  Filled 2021-11-27: qty 50

## 2021-11-27 MED ORDER — SODIUM CHLORIDE 0.9 % IV SOLN
250.0000 mg | Freq: Every day | INTRAVENOUS | Status: AC
  Administered 2021-11-27 – 2021-11-28 (×2): 250 mg via INTRAVENOUS
  Filled 2021-11-27 (×2): qty 20

## 2021-11-27 MED ORDER — SODIUM CHLORIDE 0.9% FLUSH
10.0000 mL | Freq: Two times a day (BID) | INTRAVENOUS | Status: DC
  Administered 2021-11-27 – 2021-11-29 (×5): 10 mL

## 2021-11-27 MED ORDER — SODIUM CHLORIDE 0.9% FLUSH: 10.0000 mL | INTRAVENOUS | Status: DC | PRN

## 2021-11-27 MED ORDER — ALBUMIN HUMAN 25 % IV SOLN: 12.5000 g | Freq: Once | INTRAVENOUS | Status: DC

## 2021-11-27 NOTE — Progress Notes (Signed)
Patient pulled PIV out of right forearm. Charge nurse made 2 attempts to get a new access with no success. Ultrasound guided not available at this time. Nurse held scheduled and continuous fluids, Dr. Clearence Ped made aware.

## 2021-11-27 NOTE — Progress Notes (Signed)
Initial Nutrition Assessment  DOCUMENTATION CODES:   Obesity unspecified (skewed due to fluid status)  INTERVENTION:  Provided diet education and handout CHO counting     NUTRITION DIAGNOSIS:   Limited adherence to nutrition-related recommendations related to social / environmental circumstances (lives with aunt who doesn't always buy foods that are in compliance with pt diet /DM management goals) as evidenced by per patient/family report (food stamp participant with limited access to healthy foods).   GOAL:   (Patient will adjust eating pattern and diabetes managment to improve long term health risk)   MONITOR:  Labs, Weight trends, I & O's, PO intake  REASON FOR ASSESSMENT:   Consult Assessment of nutrition requirement/status, Diet education  ASSESSMENT: Patient is a 25 yo female with hx of type 1 diabetes, CKD-3a, anemia, nephrotic syndrome, and HTN. She lives with her aunt.  Presents with uncontrolled diabetes and volume overload/anasarca. Patient receiving lasix and albumin.   Heart Healthy/CHO modified diet provided and pt is complaining of being hungry. She is eating 100% of every meal except 1 since admission. Talk about low carb choices to add to meals such as double non-starchy vegetable, sugar free jello and sugar free beverage. Half sandwich between meals. Also, reviewed fluid goal with patient.  Talked with nutrition services staff who also affirm providing sugar free jello for nursing nourishment room.   Patient weight has significantly increased by 11.9 kg (19%) in 1 month. From 11/30- 63.5 kg currently 75.4 kg.  Medications reviewed and include: Albumin, Lasix, Novolog (TID with meals 0-9 units) and Semglee (15 units BID), ferric gluconate.    Intake/Output Summary (Last 24 hours) at 11/27/2021 1426 Last data filed at 11/27/2021 1000 Gross per 24 hour  Intake 1680 ml  Output 2200 ml  Net -520 ml     CBG (last 3) A1C-15.2% -average glucose 390  mg/dl. Recent Labs    11/26/21 2053 11/27/21 0723 11/27/21 1114  GLUCAP 195* 197* 221*     Labs: BMP Latest Ref Rng & Units 11/27/2021 11/26/2021 11/25/2021  Glucose 70 - 99 mg/dL 217(H) 217(H) 115(H)  BUN 6 - 20 mg/dL 35(H) 37(H) 33(H)  Creatinine 0.44 - 1.00 mg/dL 1.88(H) 2.07(H) 2.57(H)  Sodium 135 - 145 mmol/L 137 139 136  Potassium 3.5 - 5.1 mmol/L 4.0 4.1 3.8  Chloride 98 - 111 mmol/L 100 104 104  CO2 22 - 32 mmol/L 25 24 24   Calcium 8.9 - 10.3 mg/dL 8.6(L) 8.5(L) 8.2(L)      NUTRITION - FOCUSED PHYSICAL EXAM:  Flowsheet Row Most Recent Value  Orbital Region No depletion  Upper Arm Region No depletion  Thoracic and Lumbar Region No depletion  Buccal Region No depletion  Temple Region No depletion  Clavicle Bone Region No depletion  Clavicle and Acromion Bone Region No depletion  Dorsal Hand No depletion  Patellar Region No depletion  Anterior Thigh Region No depletion  Posterior Calf Region No depletion  Edema (RD Assessment) Moderate  Hair Reviewed  Eyes Reviewed  Mouth Reviewed  Skin Reviewed  Nails Reviewed       Diet Order:   Diet Order             Diet heart healthy/carb modified Room service appropriate? Yes; Fluid consistency: Thin; Fluid restriction: 1800 mL Fluid  Diet effective now                   EDUCATION NEEDS:  Education needs have been addressed  Skin:  Skin Assessment: Skin Integrity Issues: Skin  Integrity Issues:: Stage I Stage I: thigh 12/22  Last BM:  12/26  Height:   Ht Readings from Last 1 Encounters:  11/15/2021 4' 11"  (1.499 m)    Weight:   Wt Readings from Last 1 Encounters:  11/27/21 75.4 kg    Ideal Body Weight:   45 kg  BMI:  Body mass index is 33.57 kg/m.  Estimated Nutritional Needs:   Kcal:  1700-1900  Protein:  60-65 gr  Fluid:  1800 ml per MD goal  Colman Cater MS,RD,CSG,LDN Contact: Shea Evans

## 2021-11-27 NOTE — Progress Notes (Signed)
Patient ID: Diamond [name], female   DOB: 1996/10/02, 25 y.o.   MRN: [id number] [facility] Progress Note   Assessment/ Plan:   1. Acute kidney Injury on chronic kidney disease stage IIIa (baseline creatinine around 1.1): Nonoliguric overnight with decent response to diuresis.  Etiology likely hemodynamically mediated in the setting of volume contraction from hyperosmolar hyperglycemic state/decreased effective arterial blood volume (anasarca).  Some improvement of renal function noted overnight with creatinine down to 1.9 from 2.1 she does not have any acute electrolyte abnormalities to prompt intervention.  She has underlying nephrotic syndrome and presented with anasarca for which she has been restarted back on diuretics (transiently held for intravascular volume expansion); I will reorder intravenous furosemide with albumin to help augment diuresis. 2.  Hyperosmolar hyperglycemic state: With underlying type 1 diabetes mellitus improving glycemic control status post insulin therapy (transitioned from IV to SQ) and status post volume expansion. 3.  Anemia: Without overt blood loss and likely secondary to chronic illness, iron stores borderline and she does not have any acute indications for PRBC transfusion at this time.  I will order intravenous iron 4.  Hypertension: Previously on ACE inhibitor that is currently on hold secondary to AKI, monitor with ongoing diuresis.  Subjective:   She complains of headache this morning (global/pressure-like) and denies any chest pain or shortness of breath.  Still reports to be feeling poorly.  Requests compression stockings   Objective:   BP 132/89 (BP Location: Right Arm)    Pulse (!) 108    Temp 98.7 F (37.1 C)    Resp 18    Ht 4' 11"  (1.499 m)    Wt 75.4 kg    SpO2 94%    BMI 33.57 kg/m   Intake/Output Summary (Last 24 hours) at 11/27/2021 0940 Last data filed at 11/27/2021 2683 Gross per 24 hour  Intake 1440 ml  Output 3400 ml  Net  -1960 ml   Weight change:   Physical Exam: Gen: Appears to be uncomfortable resting in bed CVS: Pulse regular tachycardia, S1 and S2 with ejection systolic murmur Resp: Diminished breath sounds over bases-poor inspiratory effort, no rales/rhonchi Abd: Soft, obese, nontender, bowel sounds normal Ext: 2+ upper and lower extremity edema  Imaging: CT HEAD WO CONTRAST (5MM)  Result Date: 11/25/2021 CLINICAL DATA:  Monocular vision loss EXAM: CT HEAD WITHOUT CONTRAST TECHNIQUE: Contiguous axial images were obtained from the base of the skull through the vertex without intravenous contrast. COMPARISON:  11/21/2021.  09/06/2021. FINDINGS: Brain: The brain shows a normal appearance without evidence of malformation, atrophy, old or acute small or large vessel infarction, mass lesion, hemorrhage, hydrocephalus or extra-axial collection. Vascular: No hyperdense vessel. No evidence of atherosclerotic calcification. Skull: Normal.  No traumatic finding.  No focal bone lesion. Sinuses/Orbits: Sinuses are clear. Orbits appear normal. Mastoids are clear. Other: None significant IMPRESSION: No change from previous examinations.  Normal head CT. Electronically Signed   By: Nelson Chimes M.D.   On: 11/25/2021 13:47    Labs: BMET Recent Labs  Lab 11/23/21 0618 11/23/21 1148 11/23/21 1436 11/24/21 0621 11/25/21 0447 11/26/21 0421 11/27/21 0410  NA 134* 136 130* 137 136 139 137  K 3.7 4.0 4.0 3.8 3.8 4.1 4.0  CL 103 104 100 102 104 104 100  CO2 26 25 24 23 24 24 25   GLUCOSE 124* 118* 182* 210* 115* 217* 217*  BUN 24* 25* 25* 32* 33* 37* 35*  CREATININE 1.61* 1.67* 1.73* 2.24* 2.57* 2.07* 1.88*  CALCIUM 7.8* 7.9* 7.7* 8.4* 8.2* 8.5* 8.6*  PHOS  --   --   --   --  4.9* 4.5 4.7*   CBC Recent Labs  Lab 11/13/2021 1544 11/23/21 0618 11/24/21 0621 11/25/21 0447 11/26/21 0421 11/27/21 0410  WBC 8.3   < > 8.7 9.5 8.2 8.3  NEUTROABS 5.8  --   --   --   --   --   HGB 10.6*   < > 8.7* 9.7* 7.8* 7.7*   HCT 34.0*   < > 28.3* 30.2* 24.9* 24.8*  MCV 95.2   < > 94.3 93.5 92.2 95.4  PLT 274   < > 338 410* 356 376   < > = values in this interval not displayed.    Medications:     enoxaparin (LOVENOX) injection  40 mg Subcutaneous Q24H   furosemide  40 mg Intravenous Daily   hydrALAZINE  25 mg Oral BID   insulin aspart  0-9 Units Subcutaneous TID WC   insulin glargine-yfgn  15 Units Subcutaneous BID   nystatin  1 application Topical TID   pantoprazole  40 mg Oral Daily   Elmarie Shiley, MD 11/27/2021, 9:40 AM

## 2021-11-27 NOTE — Progress Notes (Signed)
PROGRESS NOTE    Diamond [name]  BEM:[id number] DOB: [birth year] DOA: 11/20/2021 PCP: [name], PA-C    Brief Narrative:  25 year old female with a history of type 1 diabetes, nephrotic syndrome, presents to the hospital with uncontrolled blood sugars and significant volume overload/anasarca.  She does report compliance with her home diabetes medications as well as diuretics.  She was started on insulin infusion with improvement of blood sugars.  Also started on IV diuretics.  Nephrology following.   Assessment & Plan:   Principal Problem:   Hyperosmolar hyperglycemic state (HHS) ([place]) Active Problems:   Anasarca   Pressure injury of skin   Type 1 diabetes, uncontrolled with hyperglycemia -Started on insulin infusion on admission -Noted to have normal anion gap -Overall blood sugars have improved -She has been transitioned to subcutaneous insulin -Blood sugars currently stable  Anasarca/volume overload in the setting of nephrotic syndrome -Patient reports compliance with diuretics at home -His weight from 08/2021 noted to be 144 pounds in cardiology note. 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 -Renal ultrasound unremarkable -Restarted on albumin and IV Lasix -creatinine better today at 1.8. -Continue to follow  Hypertension -Holding ACE inhibitor since creatinine appears to be trending up -She is continued on hydralazine  Right hip pain -X-ray unremarkable  Anemia -Likely related to chronic kidney disease -No reported  bleeding -Continue to monitor  Vision loss, left eye -Patient reports that she has had progressive worsening of vision in her left eye for the past 2 to 3 weeks -Has felt that there is blood in her eye that is currently mobile but has progressively increased over the past few weeks -She was seen at [facility] twice in the past 2 weeks.  On second visit, there was concern for possible vitreous hemorrhage.  CT orbits done at that time did not show any acute findings -She has been unable to follow-up with a ophthalmologist -Feels that her vision has gotten worse last night to the point that she has difficulty making eye shapes with her left eye -Also complains of headache in her temples and occipital area which has been persistent for the last few days -CT head repeated that did not show any acute findings -Reviewed case with ophthalmology on-call, Dr. Lucianne Lei who felt that patient's symptoms likely related to vitreous hemorrhage.  Since symptoms have been progressive over the last 2 to 3 weeks, no urgent intervention is indicated at this time, although she will need to follow-up with ophthalmology when she is medically stable for discharge -Hemorrhage likely related to neovascularization and uncontrolled diabetes -This will likely need to be treated with photocoagulation in the office and cannot be managed as an inpatient.  Pressure injury, left lateral thigh, stage 1, POA Pressure Injury 11/23/21 Thigh Left;Lateral Stage 1 -  Intact skin with non-blanchable redness of a localized area usually over a bony prominence. wound bed, pink with outer ridges intact. (Active)  11/23/21 1058  Location: Thigh  Location Orientation: Left;Lateral  Staging: Stage 1 -  Intact skin with non-blanchable redness of a localized area usually over a bony prominence.  Wound Description (Comments): wound  bed, pink with outer ridges intact.  Present on Admission: Yes  -continue supportive measures.       DVT  prophylaxis: Place TED hose Start: 11/27/21 0957 enoxaparin (LOVENOX) injection 40 mg Start: 11/19/2021 2000 SCDs Start: 11/04/2021 1941  Code Status: Full code Family Communication: Discussed with patient, updated her aunt who is her guardian Disposition Plan: Status is: Inpatient  Remains inpatient appropriate because: Continued inpatient stay for IV diuresis and monitoring renal function       Consultants:  Nephrology  Procedures:    Antimicrobials:      Subjective: Complains of headache.  Feels that her edema may be mildly better since admission.  Objective: Vitals:   11/27/21 0433 11/27/21 0449 11/27/21 1348 11/27/21 2053  BP: 132/89  (!) 139/98 (!) 158/108  Pulse: (!) 108  (!) 105 (!) 109  Resp: 18  20 19   Temp: 98.7 F (37.1 C)  98.3 F (36.8 C) 98.7 F (37.1 C)  TempSrc:   Oral   SpO2: 94%  97% 94%  Weight:  75.4 kg    Height:        Intake/Output Summary (Last 24 hours) at 11/27/2021 2118 Last data filed at 11/27/2021 1721 Gross per 24 hour  Intake 1578.55 ml  Output 600 ml  Net 978.55 ml   Filed Weights   11/10/2021 1443 11/27/21 0449  Weight: 80.7 kg 75.4 kg    Examination:  General exam: Alert, awake, oriented x 3 Respiratory system: Clear to auscultation. Respiratory effort normal. Cardiovascular system:RRR. No murmurs, rubs, gallops. Gastrointestinal system: Abdomen is nondistended, soft and nontender. No organomegaly or masses felt. Normal bowel sounds heard. Central nervous system: Alert and oriented. No focal neurological deficits. Extremities: Persistent anasarca Skin: No rashes, lesions or ulcers Psychiatry: Judgement and insight appear normal. Mood & affect appropriate.      Data Reviewed: I have personally reviewed following labs and imaging studies  CBC: Recent Labs  Lab 11/06/2021 1544 11/23/21 0618 11/24/21 0621 11/25/21 0447 11/26/21 0421 11/27/21 0410  WBC 8.3 9.3 8.7 9.5 8.2 8.3  NEUTROABS 5.8  --   --   --   --   --    HGB 10.6* 8.9* 8.7* 9.7* 7.8* 7.7*  HCT 34.0* 27.6* 28.3* 30.2* 24.9* 24.8*  MCV 95.2 92.0 94.3 93.5 92.2 95.4  PLT 274 327 338 410* 356 678   Basic Metabolic Panel: Recent Labs  Lab 11/23/21 1436 11/24/21 0621 11/25/21 0447 11/26/21 0421 11/27/21 0410  NA 130* 137 136 139 137  K 4.0 3.8 3.8 4.1 4.0  CL 100 102 104 104 100  CO2 24 23 24 24 25   GLUCOSE 182* 210* 115* 217* 217*  BUN 25* 32* 33* 37* 35*  CREATININE 1.73* 2.24* 2.57* 2.07* 1.88*  CALCIUM 7.7* 8.4* 8.2* 8.5* 8.6*  PHOS  --   --  4.9* 4.5 4.7*   GFR: Estimated Creatinine Clearance: 40.5 mL/min (A) (by C-G formula based on SCr of 1.88 mg/dL (H)). Liver Function Tests: Recent Labs  Lab 11/27/2021 1544 11/24/21 0621 11/25/21 0447 11/26/21 0421 11/27/21 0410  AST 16 26  --   --   --   ALT 19 19  --   --   --   ALKPHOS 124 86  --   --   --   BILITOT 0.3 0.6  --   --   --   PROT 6.1* 6.5  --   --   --   ALBUMIN 1.8* 3.1* 2.3* 2.8* 2.6*  No results for input(s): LIPASE, AMYLASE in the last 168 hours. No results for input(s): AMMONIA in the last 168 hours. Coagulation Profile: No results for input(s): INR, PROTIME in the last 168 hours. Cardiac Enzymes: No results for input(s): CKTOTAL, CKMB, CKMBINDEX, TROPONINI in the last 168 hours. BNP (last 3 results) No results for input(s): PROBNP in the last 8760 hours. HbA1C: No results for input(s): HGBA1C in the last 72 hours.  CBG: Recent Labs  Lab 11/26/21 2053 11/27/21 0723 11/27/21 1114 11/27/21 1632 11/27/21 2058  GLUCAP 195* 197* 221* 202* 268*   Lipid Profile: No results for input(s): CHOL, HDL, LDLCALC, TRIG, CHOLHDL, LDLDIRECT in the last 72 hours. Thyroid Function Tests: No results for input(s): TSH, T4TOTAL, FREET4, T3FREE, THYROIDAB in the last 72 hours. Anemia Panel: No results for input(s): VITAMINB12, FOLATE, FERRITIN, TIBC, IRON, RETICCTPCT in the last 72 hours.  Sepsis Labs: No results for input(s): PROCALCITON, LATICACIDVEN in the  last 168 hours.  Recent Results (from the past 240 hour(s))  Resp Panel by RT-PCR (Flu A&B, Covid) Nasopharyngeal Swab     Status: None   Collection Time: 11/20/2021  5:24 PM   Specimen: Nasopharyngeal Swab; Nasopharyngeal(NP) swabs in vial transport medium  Result Value Ref Range Status   SARS Coronavirus 2 by RT PCR NEGATIVE NEGATIVE Final    Comment: (NOTE) SARS-CoV-2 target nucleic acids are NOT DETECTED.  The SARS-CoV-2 RNA is generally detectable in upper respiratory specimens during the acute phase of infection. The lowest concentration of SARS-CoV-2 viral copies this assay can detect is 138 copies/mL. A negative result does not preclude SARS-Cov-2 infection and should not be used as the sole basis for treatment or other patient management decisions. A negative result may occur with  improper specimen collection/handling, submission of specimen other than nasopharyngeal swab, presence of viral mutation(s) within the areas targeted by this assay, and inadequate number of viral copies(<138 copies/mL). A negative result must be combined with clinical observations, patient history, and epidemiological information. The expected result is Negative.  Fact Sheet for Patients:  EntrepreneurPulse.com.au  Fact Sheet for Healthcare Providers:  IncredibleEmployment.be  This test is no t yet approved or cleared by the [place] FDA and  has been authorized for detection and/or diagnosis of SARS-CoV-2 by FDA under an Emergency Use Authorization (EUA). This EUA will remain  in effect (meaning this test can be used) for the duration of the COVID-19 declaration under Section 564(b)(1) of the Act, 21 U.S.C.section 360bbb-3(b)(1), unless the authorization is terminated  or revoked sooner.       Influenza A by PCR NEGATIVE NEGATIVE Final   Influenza B by PCR NEGATIVE NEGATIVE Final    Comment: (NOTE) The Xpert Xpress SARS-CoV-2/FLU/RSV plus assay is  intended as an aid in the diagnosis of influenza from Nasopharyngeal swab specimens and should not be used as a sole basis for treatment. Nasal washings and aspirates are unacceptable for Xpert Xpress SARS-CoV-2/FLU/RSV testing.  Fact Sheet for Patients: EntrepreneurPulse.com.au  Fact Sheet for Healthcare Providers: IncredibleEmployment.be  This test is not yet approved or cleared by the [place] FDA and has been authorized for detection and/or diagnosis of SARS-CoV-2 by FDA under an Emergency Use Authorization (EUA). This EUA will remain in effect (meaning this test can be used) for the duration of the COVID-19 declaration under Section 564(b)(1) of the Act, 21 U.S.C. section 360bbb-3(b)(1), unless the authorization is terminated or revoked.  Performed at [facility], [address], [place], [place] [address]  Radiology Studies: No results found.      Scheduled Meds:  enoxaparin (LOVENOX) injection  40 mg Subcutaneous Q24H   furosemide  40 mg Intravenous Once   hydrALAZINE  25 mg Oral BID   insulin aspart  0-9 Units Subcutaneous TID WC   insulin glargine-yfgn  15 Units Subcutaneous BID   nystatin  1 application Topical TID   pantoprazole  40 mg Oral Daily   sodium chloride flush  10-40 mL Intracatheter Q12H   Continuous Infusions:  albumin human     ferric gluconate (FERRLECIT) IVPB 135 mL/hr at 11/27/21 1721     LOS: 5 days    Time spent: 50mns    JKathie Dike MD [facility]   If 7PM-7AM, please contact night-coverage [email or web address]  11/27/2021, 9:18 PM

## 2021-11-28 ENCOUNTER — Inpatient Hospital Stay (HOSPITAL_COMMUNITY): Payer: Medicaid Other

## 2021-11-28 LAB — GLUCOSE, CAPILLARY
Glucose-Capillary: 192 mg/dL — ABNORMAL HIGH (ref 70–99)
Glucose-Capillary: 160 mg/dL — ABNORMAL HIGH (ref 70–99)
Glucose-Capillary: 169 mg/dL — ABNORMAL HIGH (ref 70–99)
Glucose-Capillary: 160 mg/dL — ABNORMAL HIGH (ref 70–99)

## 2021-11-28 LAB — CBC
HCT: 25.6 % — ABNORMAL LOW (ref 36.0–46.0)
Hemoglobin: 8 g/dL — ABNORMAL LOW (ref 12.0–15.0)
MCH: 29.9 pg (ref 26.0–34.0)
MCHC: 31.3 g/dL (ref 30.0–36.0)
MCV: 95.5 fL (ref 80.0–100.0)
Platelets: 418 10*3/uL — ABNORMAL HIGH (ref 150–400)
RBC: 2.68 MIL/uL — ABNORMAL LOW (ref 3.87–5.11)
RDW: 14.1 % (ref 11.5–15.5)
WBC: 9.9 10*3/uL (ref 4.0–10.5)
nRBC: 0 % (ref 0.0–0.2)

## 2021-11-28 LAB — RENAL FUNCTION PANEL
Albumin: 2.7 g/dL — ABNORMAL LOW (ref 3.5–5.0)
Anion gap: 6 (ref 5–15)
BUN: 32 mg/dL — ABNORMAL HIGH (ref 6–20)
CO2: 30 mmol/L (ref 22–32)
Calcium: 8.5 mg/dL — ABNORMAL LOW (ref 8.9–10.3)
Chloride: 101 mmol/L (ref 98–111)
Creatinine, Ser: 1.82 mg/dL — ABNORMAL HIGH (ref 0.44–1.00)
GFR, Estimated: 39 mL/min — ABNORMAL LOW (ref >60)
Glucose, Bld: 229 mg/dL — ABNORMAL HIGH (ref 70–99)
Phosphorus: 4.4 mg/dL (ref 2.5–4.6)
Potassium: 4.4 mmol/L (ref 3.5–5.1)
Sodium: 137 mmol/L (ref 135–145)

## 2021-11-28 LAB — BLOOD GAS, ARTERIAL
Acid-Base Excess: 5.3 mmol/L — ABNORMAL HIGH (ref 0.0–2.0)
Bicarbonate: 28.9 mmol/L — ABNORMAL HIGH (ref 20.0–28.0)
Drawn by: 27016
FIO2: 28
O2 Saturation: 91.7 %
Patient temperature: 37
pCO2 arterial: 51.1 mmHg — ABNORMAL HIGH (ref 32.0–48.0)
pH, Arterial: 7.388 (ref 7.350–7.450)
pO2, Arterial: 63.5 mmHg — ABNORMAL LOW (ref 83.0–108.0)

## 2021-11-28 MED ORDER — FUROSEMIDE 10 MG/ML IJ SOLN
40.0000 mg | Freq: Two times a day (BID) | INTRAMUSCULAR | Status: DC
  Administered 2021-11-28 – 2021-11-29 (×3): 40 mg via INTRAVENOUS
  Filled 2021-11-28 (×3): qty 4

## 2021-11-28 MED ORDER — DIPHENHYDRAMINE HCL 50 MG/ML IJ SOLN
25.0000 mg | Freq: Once | INTRAMUSCULAR | Status: AC
  Administered 2021-11-28: 02:00:00 25 mg via INTRAVENOUS

## 2021-11-28 MED ORDER — ALBUMIN HUMAN 25 % IV SOLN
25.0000 g | Freq: Four times a day (QID) | INTRAVENOUS | Status: AC
  Administered 2021-11-28 (×3): 25 g via INTRAVENOUS
  Filled 2021-11-28 (×3): qty 100

## 2021-11-28 NOTE — Progress Notes (Signed)
°   11/28/21 1648  Vitals  Temp 98.7 F (37.1 C)  BP 119/74  MAP (mmHg) 87  BP Location Left Arm  BP Method Automatic  Patient Position (if appropriate) Lying  Pulse Rate (!) 111  Pulse Rate Source Monitor  Resp 16  MEWS COLOR  MEWS Score Color Yellow  Oxygen Therapy  SpO2 (!) 81 %  O2 Device Room Air  MEWS Score  MEWS Temp 0  MEWS Systolic 0  MEWS Pulse 2  MEWS RR 0  MEWS LOC 0  MEWS Score 2

## 2021-11-28 NOTE — Progress Notes (Signed)
[facility] NEPHROLOGY PROGRESS NOTE  Assessment/ Plan: Pt is a 25 y.o. yo female with type I DM, biopsy-proven diabetic nephropathy with nephrotic syndrome, admitted with significant volume overload/anasarca and uncontrolled hyperglycemia.  #Acute kidney injury on CKD stage IIIa: Seems to be hemodynamically mediated/reduced effective arterial blood volume.  She has been receiving IV Lasix with albumin and seems to be responding well.  She is still significantly volume up and exam consistent with anasarca.  I will continue albumin and change IV Lasix to twice a day.  Once he is volume optimized, we will plan to increase home Lasix to 40 mg twice a day and follow-up with her nephrologist Dr. Theador Hawthorne.  Continue strict ins and outs, daily lab.  #Hyperosmolar hyperglycemic state: With history of type 1 diabetes.  Admitted with hyperglycemia and status post volume expansion.  Managing by primary team.  # Anemia due to iron deficiency: Receiving IV iron.  Monitor hemoglobin.  Transfusion as needed.  # HTN/volume: Blood pressure acceptable.  Currently on hydralazine and volume managed by Lasix.  Subjective: Seen and examined.  Patient is concerned about generalized body swelling.  Urine output is recorded around 1.1 L in 24 hours.  Denies nausea, vomiting, chest pain or shortness of breath.  No new event. Objective Vital signs in last 24 hours: Vitals:   11/27/21 0449 11/27/21 1348 11/27/21 2053 11/28/21 0500  BP:  (!) 139/98 (!) 158/108 131/88  Pulse:  (!) 105 (!) 109 (!) 108  Resp:  20 19 18   Temp:  98.3 F (36.8 C) 98.7 F (37.1 C) 98.3 F (36.8 C)  TempSrc:  Oral  Oral  SpO2:  97% 94% 96%  Weight: 75.4 kg   76.8 kg  Height:       Weight change: 1.394 kg  Intake/Output Summary (Last 24 hours) at 11/28/2021 0859 Last data filed at 11/28/2021 0500 Gross per 24 hour  Intake 2191.83 ml  Output 1100 ml  Net 1091.83 ml       Labs: Basic Metabolic Panel: Recent Labs   Lab 11/26/21 0421 11/27/21 0410 11/28/21 0544  NA 139 137 137  K 4.1 4.0 4.4  CL 104 100 101  CO2 24 25 30   GLUCOSE 217* 217* 229*  BUN 37* 35* 32*  CREATININE 2.07* 1.88* 1.82*  CALCIUM 8.5* 8.6* 8.5*  PHOS 4.5 4.7* 4.4   Liver Function Tests: Recent Labs  Lab 11/09/2021 1544 11/24/21 0621 11/25/21 0447 11/26/21 0421 11/27/21 0410 11/28/21 0544  AST 16 26  --   --   --   --   ALT 19 19  --   --   --   --   ALKPHOS 124 86  --   --   --   --   BILITOT 0.3 0.6  --   --   --   --   PROT 6.1* 6.5  --   --   --   --   ALBUMIN 1.8* 3.1*   < > 2.8* 2.6* 2.7*   < > = values in this interval not displayed.   No results for input(s): LIPASE, AMYLASE in the last 168 hours. No results for input(s): AMMONIA in the last 168 hours. CBC: Recent Labs  Lab 11/07/2021 1544 11/23/21 0618 11/24/21 0621 11/25/21 0447 11/26/21 0421 11/27/21 0410 11/28/21 0544  WBC 8.3   < > 8.7 9.5 8.2 8.3 9.9  NEUTROABS 5.8  --   --   --   --   --   --  HGB 10.6*   < > 8.7* 9.7* 7.8* 7.7* 8.0*  HCT 34.0*   < > 28.3* 30.2* 24.9* 24.8* 25.6*  MCV 95.2   < > 94.3 93.5 92.2 95.4 95.5  PLT 274   < > 338 410* 356 376 418*   < > = values in this interval not displayed.   Cardiac Enzymes: No results for input(s): CKTOTAL, CKMB, CKMBINDEX, TROPONINI in the last 168 hours. CBG: Recent Labs  Lab 11/27/21 0723 11/27/21 1114 11/27/21 1632 11/27/21 2058 11/28/21 0731  GLUCAP 197* 221* 202* 268* 192*    Iron Studies: No results for input(s): IRON, TIBC, TRANSFERRIN, FERRITIN in the last 72 hours. Studies/Results: No results found.  Medications: Infusions:  albumin human     ferric gluconate (FERRLECIT) IVPB Stopped (11/27/21 1919)    Scheduled Medications:  enoxaparin (LOVENOX) injection  40 mg Subcutaneous Q24H   furosemide  40 mg Intravenous Q12H   hydrALAZINE  25 mg Oral BID   insulin aspart  0-9 Units Subcutaneous TID WC   insulin glargine-yfgn  15 Units Subcutaneous BID   nystatin  1  application Topical TID   pantoprazole  40 mg Oral Daily   sodium chloride flush  10-40 mL Intracatheter Q12H    have reviewed scheduled and prn medications.  Physical Exam: General:NAD, comfortable, able to lie flat Heart:RRR, s1s2 nl Lungs:clear b/l, no crackle Abdomen:soft, Non-tender, non-distended Extremities: Pitting edema up to thighs++ Neurology: Alert, awake, oriented and nonfocal.  Clearnce Leja Tanna Furry 11/28/2021,8:59 AM  LOS: 6 days

## 2021-11-28 NOTE — Progress Notes (Signed)
Was notified by NT that pt felt dizzy and weak. Pt's O2 sat was 81 with a pulse rate of 111. Placed pt on 2L O2 and vitals stabilized. MD was notified. No new orders at this time.

## 2021-11-28 NOTE — Progress Notes (Addendum)
PROGRESS NOTE    Diamond [name]  MEQ:[id number] DOB: [birth year] DOA: 11/05/2021 PCP: [name], PA-C    Brief Narrative:  25 year old female with a history of type 1 diabetes, nephrotic syndrome, presents to the hospital with uncontrolled blood sugars and significant volume overload/anasarca.  Diamond [name] does report compliance with her home diabetes medications as well as diuretics.  Diamond [name] was started on insulin infusion with improvement of blood sugars.  Also started on IV diuretics.  Nephrology following.   Assessment & Plan:   Principal Problem:   Hyperosmolar hyperglycemic state (HHS) ([place]) Active Problems:   Anasarca   Pressure injury of skin   Type 1 diabetes, uncontrolled with hyperglycemia -Started on insulin infusion on admission -Noted to have normal anion gap -Overall blood sugars have improved -Diamond [name] has been transitioned to subcutaneous insulin -Blood sugars currently stable  Anasarca/volume overload in the setting of nephrotic syndrome -Patient reports compliance with diuretics at home -His weight from 08/2021 noted to be 144 pounds in cardiology note. -Admission weight noted to be 178 pounds -Current weight 169 pounds -Continue daily weights -Diamond [name] was started on Lasix and albumin infusions -Nephrology following, appreciate input -will continue current management  Hypoxia -Noted by staff the patient's oxygen saturations in the low 80s -This was confirmed on multiple fingers. -2 L of oxygen have been applied. -Checking ABG/chest x-ray  Various neurologic complaints -Patient does complain of increasing sleepiness, numbness in her legs, weakness in her legs, tingling in her fingers, difficulty hearing, difficulty with her vision, having trouble finding her words. -Reports that the symptoms have been present for several hours now.  Diamond [name] did not report this earlier staff. -On exam, Diamond [name] does appear to have some inconsistencies in that Diamond [name] is unable to significantly  raise her legs during motor exam, but Diamond [name] was noted to be able to ambulate to use the bathroom.  Diamond [name] is also able to retract her legs when checking Babinski's -Diamond [name] was started on hydrocodone yesterday for pain which may be playing some part in her sleepiness, hypoxia. -We will discontinue further opiates -CBG checked and noted to be normal -Diamond [name] recently had CT head which was unremarkable -We will continue to monitor for any developing/progression of symptoms.  AKI on CKD stage IIIa -Creatinine trended up since admission to 2.57 -Baseline creatinine possibly around 1.1 -Of note, patient did receive intravenous contrast on 12/21 for CT of her orbits. -Renal ultrasound unremarkable -Restarted on albumin and IV Lasix -creatinine better today at 1.8. -Continue to follow  Hypertension -Holding ACE inhibitor since creatinine appears to be trending up -Diamond [name] is continued on hydralazine  Right hip pain -X-ray unremarkable  Anemia -Likely related to chronic kidney disease -No reported bleeding -Continue to monitor  Vision loss, left eye -Patient reports that Diamond [name] has had progressive worsening of vision in her left eye for the past 2 to 3 weeks -Has felt that there is blood in her eye that is currently mobile but has progressively increased over the past few weeks -Diamond [name] was seen at [facility] twice in the past 2 weeks.  On second visit, there was concern for possible vitreous hemorrhage.  CT orbits done at that time did not show any acute findings -Diamond [name] has been unable to follow-up with a ophthalmologist -Feels that her vision has gotten worse last night to the point that Diamond [name] has difficulty making eye shapes with her left eye -Also complains of headache in her temples and occipital area which has been persistent for the last few  days -CT head repeated that did not show any acute findings -Reviewed case with ophthalmology on-call, Dr. Lucianne Lei who felt that patient's symptoms likely related to  vitreous hemorrhage.  Since symptoms have been progressive over the last 2 to 3 weeks, no urgent intervention is indicated at this time, although Diamond [name] will need to follow-up with ophthalmology when Diamond [name] is medically stable for discharge -Hemorrhage likely related to neovascularization and uncontrolled diabetes -This will likely need to be treated with photocoagulation in the office and cannot be managed as an inpatient.  Pressure injury, left lateral thigh, stage 1, POA Pressure Injury 11/23/21 Thigh Left;Lateral Stage 1 -  Intact skin with non-blanchable redness of a localized area usually over a bony prominence. wound bed, pink with outer ridges intact. (Active)  11/23/21 1058  Location: Thigh  Location Orientation: Left;Lateral  Staging: Stage 1 -  Intact skin with non-blanchable redness of a localized area usually over a bony prominence.  Wound Description (Comments): wound bed, pink with outer ridges intact.  Present on Admission: Yes  -continue supportive measures.   Obesity -BMI 34.1 -Counseled on the importance of diet and exercise    DVT prophylaxis: Place TED hose Start: 11/27/21 0957 enoxaparin (LOVENOX) injection 40 mg Start: 11/17/2021 2000 SCDs Start: 11/27/2021 1941  Code Status: Full code Family Communication: Discussed with patient, updated her aunt who is her guardian Disposition Plan: Status is: Inpatient  Remains inpatient appropriate because: Continued inpatient stay for IV diuresis and monitoring renal function       Consultants:  Nephrology  Procedures:    Antimicrobials:      Subjective: Diamond [name] has a variety of complaints today.  Complains of diffuse body pains.  Diamond [name] is sleeping on my arrival.  Upon waking up, Diamond [name] says that Diamond [name] feels as though her head is underwater.  Diamond [name] is having difficulty hearing.  Also has difficulty seeing.  Diamond [name] has numbness in her legs.  Diamond [name] has tingling in her hands.  Diamond [name] says that both of her legs are very weak and having  difficulty moving them.  Reportedly symptoms have been present for several hours.  Staff reports that Diamond [name] was noted to be able to stand and ambulate to use the bathroom.  Objective: Vitals:   11/28/21 0500 11/28/21 1300 11/28/21 1648 11/28/21 1910  BP: 131/88 (!) 135/94 119/74 99/73  Pulse: (!) 108 (!) 110 (!) 111 (!) 114  Resp: 18 20 16 17   Temp: 98.3 F (36.8 C) 98.5 F (36.9 C) 98.7 F (37.1 C) 99.4 F (37.4 C)  TempSrc: Oral Oral Oral Oral  SpO2: 96% 95% (!) 81% 99%  Weight: 76.8 kg     Height:        Intake/Output Summary (Last 24 hours) at 11/28/2021 2058 Last data filed at 11/28/2021 1835 Gross per 24 hour  Intake 1899.06 ml  Output 1500 ml  Net 399.06 ml   Filed Weights   11/08/2021 1443 11/27/21 0449 11/28/21 0500  Weight: 80.7 kg 75.4 kg 76.8 kg    Examination:  General exam: Alert, awake, oriented x 3 Respiratory system: Clear to auscultation. Respiratory effort normal. Cardiovascular system:RRR. No murmurs, rubs, gallops. Gastrointestinal system: Abdomen is nondistended, soft and nontender. No organomegaly or masses felt. Normal bowel sounds heard. Central nervous system: Alert and oriented.  Has difficulty moving the legs bilaterally, but this may be related to poor effort.  Diamond [name] does actively retract her legs when checking densities bilaterally. Extremities: Continues to have significant anasarca Skin: No rashes, lesions or ulcers  Psychiatry: Judgement and insight appear normal. Mood & affect appropriate.       Data Reviewed: I have personally reviewed following labs and imaging studies  CBC: Recent Labs  Lab 11/12/2021 1544 11/23/21 0618 11/24/21 0621 11/25/21 0447 11/26/21 0421 11/27/21 0410 11/28/21 0544  WBC 8.3   < > 8.7 9.5 8.2 8.3 9.9  NEUTROABS 5.8  --   --   --   --   --   --   HGB 10.6*   < > 8.7* 9.7* 7.8* 7.7* 8.0*  HCT 34.0*   < > 28.3* 30.2* 24.9* 24.8* 25.6*  MCV 95.2   < > 94.3 93.5 92.2 95.4 95.5  PLT 274   < > 338 410* 356 376  418*   < > = values in this interval not displayed.   Basic Metabolic Panel: Recent Labs  Lab 11/24/21 0621 11/25/21 0447 11/26/21 0421 11/27/21 0410 11/28/21 0544  NA 137 136 139 137 137  K 3.8 3.8 4.1 4.0 4.4  CL 102 104 104 100 101  CO2 23 24 24 25 30   GLUCOSE 210* 115* 217* 217* 229*  BUN 32* 33* 37* 35* 32*  CREATININE 2.24* 2.57* 2.07* 1.88* 1.82*  CALCIUM 8.4* 8.2* 8.5* 8.6* 8.5*  PHOS  --  4.9* 4.5 4.7* 4.4   GFR: Estimated Creatinine Clearance: 42.2 mL/min (A) (by C-G formula based on SCr of 1.82 mg/dL (H)). Liver Function Tests: Recent Labs  Lab 11/18/2021 1544 11/24/21 0621 11/25/21 0447 11/26/21 0421 11/27/21 0410 11/28/21 0544  AST 16 26  --   --   --   --   ALT 19 19  --   --   --   --   ALKPHOS 124 86  --   --   --   --   BILITOT 0.3 0.6  --   --   --   --   PROT 6.1* 6.5  --   --   --   --   ALBUMIN 1.8* 3.1* 2.3* 2.8* 2.6* 2.7*   No results for input(s): LIPASE, AMYLASE in the last 168 hours. No results for input(s): AMMONIA in the last 168 hours. Coagulation Profile: No results for input(s): INR, PROTIME in the last 168 hours. Cardiac Enzymes: No results for input(s): CKTOTAL, CKMB, CKMBINDEX, TROPONINI in the last 168 hours. BNP (last 3 results) No results for input(s): PROBNP in the last 8760 hours. HbA1C: No results for input(s): HGBA1C in the last 72 hours.  CBG: Recent Labs  Lab 11/27/21 2058 11/28/21 0731 11/28/21 1158 11/28/21 1637 11/28/21 1742  GLUCAP 268* 192* 160* 169* 160*   Lipid Profile: No results for input(s): CHOL, HDL, LDLCALC, TRIG, CHOLHDL, LDLDIRECT in the last 72 hours. Thyroid Function Tests: No results for input(s): TSH, T4TOTAL, FREET4, T3FREE, THYROIDAB in the last 72 hours. Anemia Panel: No results for input(s): VITAMINB12, FOLATE, FERRITIN, TIBC, IRON, RETICCTPCT in the last 72 hours.  Sepsis Labs: No results for input(s): PROCALCITON, LATICACIDVEN in the last 168 hours.  Recent Results (from the past  240 hour(s))  Resp Panel by RT-PCR (Flu A&B, Covid) Nasopharyngeal Swab     Status: None   Collection Time: 11/18/2021  5:24 PM   Specimen: Nasopharyngeal Swab; Nasopharyngeal(NP) swabs in vial transport medium  Result Value Ref Range Status   SARS Coronavirus 2 by RT PCR NEGATIVE NEGATIVE Final    Comment: (NOTE) SARS-CoV-2 target nucleic acids are NOT DETECTED.  The SARS-CoV-2 RNA is generally detectable in upper respiratory specimens  during the acute phase of infection. The lowest concentration of SARS-CoV-2 viral copies this assay can detect is 138 copies/mL. A negative result does not preclude SARS-Cov-2 infection and should not be used as the sole basis for treatment or other patient management decisions. A negative result may occur with  improper specimen collection/handling, submission of specimen other than nasopharyngeal swab, presence of viral mutation(s) within the areas targeted by this assay, and inadequate number of viral copies(<138 copies/mL). A negative result must be combined with clinical observations, patient history, and epidemiological information. The expected result is Negative.  Fact Sheet for Patients:  EntrepreneurPulse.com.au  Fact Sheet for Healthcare Providers:  IncredibleEmployment.be  This test is no t yet approved or cleared by the [place] FDA and  has been authorized for detection and/or diagnosis of SARS-CoV-2 by FDA under an Emergency Use Authorization (EUA). This EUA will remain  in effect (meaning this test can be used) for the duration of the COVID-19 declaration under Section 564(b)(1) of the Act, 21 U.S.C.section 360bbb-3(b)(1), unless the authorization is terminated  or revoked sooner.       Influenza A by PCR NEGATIVE NEGATIVE Final   Influenza B by PCR NEGATIVE NEGATIVE Final    Comment: (NOTE) The Xpert Xpress SARS-CoV-2/FLU/RSV plus assay is intended as an aid in the diagnosis of influenza  from Nasopharyngeal swab specimens and should not be used as a sole basis for treatment. Nasal washings and aspirates are unacceptable for Xpert Xpress SARS-CoV-2/FLU/RSV testing.  Fact Sheet for Patients: EntrepreneurPulse.com.au  Fact Sheet for Healthcare Providers: IncredibleEmployment.be  This test is not yet approved or cleared by the [place] FDA and has been authorized for detection and/or diagnosis of SARS-CoV-2 by FDA under an Emergency Use Authorization (EUA). This EUA will remain in effect (meaning this test can be used) for the duration of the COVID-19 declaration under Section 564(b)(1) of the Act, 21 U.S.C. section 360bbb-3(b)(1), unless the authorization is terminated or revoked.  Performed at [facility], [address], [place], [place] [address]          Radiology Studies: DG CHEST PORT 1 VIEW  Result Date: 11/28/2021 CLINICAL DATA:  Shortness of breath. EXAM: PORTABLE CHEST 1 VIEW COMPARISON:  Chest radiograph dated 11/24/2021. FINDINGS: Bilateral streaky atelectasis or infiltrate. Probable trace bilateral pleural effusions. No pneumothorax. The cardiac silhouette is within normal limits. No acute osseous pathology. No acute osseous pathology. IMPRESSION: Bilateral streaky atelectasis or infiltrate. Electronically Signed   By: Anner Crete M.D.   On: 11/28/2021 20:05        Scheduled Meds:  enoxaparin (LOVENOX) injection  40 mg Subcutaneous Q24H   furosemide  40 mg Intravenous Q12H   hydrALAZINE  25 mg Oral BID   insulin aspart  0-9 Units Subcutaneous TID WC   insulin glargine-yfgn  15 Units Subcutaneous BID   nystatin  1 application Topical TID   pantoprazole  40 mg Oral Daily   sodium chloride flush  10-40 mL Intracatheter Q12H   Continuous Infusions:  albumin human Stopped (11/28/21 1832)     LOS: 6 days    Time spent: 50mns    JKathie Dike MD [facility]   If 7PM-7AM, please  contact night-coverage [email or web address]  11/28/2021, 8:58 PM

## 2021-11-28 NOTE — TOC Initial Note (Signed)
Transition of Care [facility]) - Initial/Assessment Note    Patient Details  Name: Diamond [name] MRN: [id number] Date of Birth: 04/24/1996  Transition of [facility]) CM/SW Contact:    Ihor Gully, LCSW Phone Number: 11/28/2021, 2:34 PM  Clinical Narrative:                 Patient from home. Admitted for HHS. Considered high risk for readmission. TOC consulted for HH/DME needs. Patient is uninsured. States that since this admission, [facility] financial counselor made contact with her to complete Medicaid application, however she was denied Medicaid. Patient is seen medically at the [facility]. Her PCP is [name]. TOC will continue to follow for d/c needs.   Expected Discharge Plan: [place] Barriers to Discharge: Continued Medical Work up   Patient Goals and CMS Choice Patient states their goals for this hospitalization and ongoing recovery are:: return home      Expected Discharge Plan and Services Expected Discharge Plan: [place]       Living arrangements for the past 2 months: Single Family Home                                      Prior Living Arrangements/Services Living arrangements for the past 2 months: Single Family Home                     Activities of Daily Living Home Assistive Devices/Equipment: Cane (specify quad or straight) (cane used for walking upstairs) ADL Screening (condition at time of admission) Patient's cognitive ability adequate to safely complete daily activities?: Yes Is the patient deaf or have difficulty hearing?: No Does the patient have difficulty seeing, even when wearing glasses/contacts?: No Does the patient have difficulty concentrating, remembering, or making decisions?: No Patient able to express need for assistance with ADLs?: Yes Does the patient have difficulty dressing or bathing?: No Independently performs ADLs?: Yes (appropriate for developmental age) Does the patient  have difficulty walking or climbing stairs?: No Weakness of Legs: Both Weakness of Arms/Hands: None  Permission Sought/Granted                  Emotional Assessment     Affect (typically observed): Appropriate Orientation: : Oriented to Self, Oriented to Place, Oriented to  Time, Oriented to Situation Alcohol / Substance Use: Not Applicable Psych Involvement: No (comment)  Admission diagnosis:  Anasarca [R60.1] Hypoalbuminemia [E88.09] Pain [R52] Hypoxia [R09.02] AKI (acute kidney injury) ([place]) [N17.9] Hyperosmolar hyperglycemic state (HHS) ([place]) [E11.00] Patient Active Problem List   Diagnosis Date Noted   Pressure injury of skin 11/23/2021   Psychosocial problem 09/14/2021   Rectal bleeding 09/06/2021   Suprapubic pain 09/06/2021   Odynophagia 09/06/2021   Atypical chest pain    Elevated troponin    Diabetic ketoacidosis without coma associated with type 1 diabetes mellitus ([place])    DKA, type 1 ([place]) 08/11/2021   Discitis of lumbar region 05/29/2021   Diarrhea 04/11/2021   Pain of upper abdomen 03/54/6568   Eosinophilic esophagitis 12/75/1700   MSSA bacteremia 04/07/2021   Arm DVT (deep venous thromboembolism), acute, right ([place]) 04/07/2021   AKI (acute kidney injury) ([place]) 04/01/2021   Pyelonephritis 03/31/2021   Severe low back pain    Intractable nausea and vomiting 03/16/2021   Nausea and vomiting    DM1 with Diabetic nephropathy with proteinuria and  Nephrotic Syndrome- 03/10/2021    Class: Chronic   Community acquired pneumonia 03/05/2021   Type 2 or unspecified type diabetes mellitus 02/23/2021   Bladder outlet obstruction    Constipation    Abdominal pain, epigastric    Abnormal computed tomography of cecum and terminal ileum    Hyperosmolar hyperglycemic state (HHS) ([place]) 01/30/2021   Crohn's disease ([place]) 12/08/2020   Diabetic gastroparesis ([place]) 12/08/2020   Essential hypertension, benign 11/01/2020   Edema 10/19/2020   Anasarca 10/18/2020    Hypoalbuminemia-due to proteinuria/nephrotic syndrome 10/18/2020   Personal history of noncompliance with medical treatment, presenting hazards to health 10/12/2020   Bilateral lower extremity edema 08/25/2020   Pneumonia 08/25/2020   Ileus ([place]) 08/06/2020   Methicillin susceptible Staphylococcus aureus infection, unspecified site 07/08/2020   Cellulitis and abscess of buttock 06/20/2020   Skin abscess 04/24/2020   Mild intermittent asthma without complication 80/88/1103   Sepsis due to skin infection ([place]) 04/17/2020   Type 1 diabetes mellitus, uncontrolled 10/01/2013   Eczema 10/23/2012   Flank pain 08/27/2012   Gastroesophageal reflux disease 07/25/2012   Mood disorder ([place]) 07/09/2012   Contraception management 07/09/2012   History of Crohn's disease 07/09/2012   Uncontrolled type 1 diabetes mellitus with hyperglycemia and Diabetic Nephropathy 07/08/2012    Class: Chronic   Asthma, persistent controlled 07/08/2012   PCP:  [name], PA-C Pharmacy:   [facility], [place], [address], [place] [address] Phone: [phone] Fax: [phone]  [place], [place] [address] Phone: [phone] Fax: [phone]     Social Determinants of Health (SDOH) Interventions    Readmission Risk Interventions Readmission Risk Prevention Plan 03/07/2021 01/27/2021  Transportation Screening Complete Complete  PCP or Specialist Appt within 3-5 Days - Complete  HRI or [place] - Complete  Social Work Consult for [place] Planning/Counseling - Complete  Palliative Care Screening - Not Applicable  Medication Review Press photographer) Complete Complete  HRI or Home Care Consult Complete -  SW Recovery Care/Counseling Consult Complete -  Palliative Care Screening Not Applicable -  [place] Not Applicable -  Some recent  data might be hidden

## 2021-11-28 NOTE — Progress Notes (Signed)
Inpatient Diabetes Program Recommendations  AACE/ADA: New Consensus Statement on Inpatient Glycemic Control (2015)  Target Ranges:  Prepandial:   less than 140 mg/dL      Peak postprandial:   less than 180 mg/dL (1-2 hours)      Critically ill patients:  140 - 180 mg/dL   Lab Results  Component Value Date   GLUCAP 192 (H) 11/28/2021   HGBA1C 15.2 (H) 11/23/2021    Review of Glycemic Control  Latest Reference Range & Units 11/27/21 11:14 11/27/21 16:32 11/27/21 20:58 11/28/21 07:31  Glucose-Capillary 70 - 99 mg/dL 221 (H) 202 (H) 268 (H) 192 (H)  (H): Data is abnormally high Diabetes history: DM type 1 Outpatient Diabetes medications: Lantus 40 units bid, Apidra 10-16 units tid Current orders for Inpatient glycemic control:  Semglee 15 units bid Novolog 0-9 units tid  Inpatient Diabetes Program Recommendations:    Consider restarting Novolog meal coverage 3 units tid with meals.   Thanks,  Adah Perl, RN, BC-ADM Inpatient Diabetes Coordinator Pager [phone]  (8a-5p)

## 2021-11-29 LAB — GLUCOSE, CAPILLARY
Glucose-Capillary: 141 mg/dL — ABNORMAL HIGH (ref 70–99)
Glucose-Capillary: 150 mg/dL — ABNORMAL HIGH (ref 70–99)
Glucose-Capillary: 157 mg/dL — ABNORMAL HIGH (ref 70–99)
Glucose-Capillary: 198 mg/dL — ABNORMAL HIGH (ref 70–99)

## 2021-11-29 LAB — CBC
HCT: 23.4 % — ABNORMAL LOW (ref 36.0–46.0)
Hemoglobin: 7 g/dL — ABNORMAL LOW (ref 12.0–15.0)
MCH: 29.3 pg (ref 26.0–34.0)
MCHC: 29.9 g/dL — ABNORMAL LOW (ref 30.0–36.0)
MCV: 97.9 fL (ref 80.0–100.0)
Platelets: 400 10*3/uL (ref 150–400)
RBC: 2.39 MIL/uL — ABNORMAL LOW (ref 3.87–5.11)
RDW: 14.5 % (ref 11.5–15.5)
WBC: 8.8 10*3/uL (ref 4.0–10.5)
nRBC: 0 % (ref 0.0–0.2)

## 2021-11-29 LAB — RENAL FUNCTION PANEL
Albumin: 3.1 g/dL — ABNORMAL LOW (ref 3.5–5.0)
Anion gap: 6 (ref 5–15)
BUN: 33 mg/dL — ABNORMAL HIGH (ref 6–20)
CO2: 31 mmol/L (ref 22–32)
Calcium: 8.7 mg/dL — ABNORMAL LOW (ref 8.9–10.3)
Chloride: 103 mmol/L (ref 98–111)
Creatinine, Ser: 1.9 mg/dL — ABNORMAL HIGH (ref 0.44–1.00)
GFR, Estimated: 37 mL/min — ABNORMAL LOW (ref >60)
Glucose, Bld: 176 mg/dL — ABNORMAL HIGH (ref 70–99)
Phosphorus: 4.1 mg/dL (ref 2.5–4.6)
Potassium: 4.3 mmol/L (ref 3.5–5.1)
Sodium: 140 mmol/L (ref 135–145)

## 2021-11-29 LAB — FOLATE RBC
Folate, Hemolysate: 308 ng/mL
Folate, RBC: 1208 ng/mL (ref >498)
Hematocrit: 25.5 % — ABNORMAL LOW (ref 34.0–46.6)

## 2021-11-29 MED ORDER — TORSEMIDE 20 MG PO TABS
40.0000 mg | ORAL_TABLET | Freq: Two times a day (BID) | ORAL | Status: DC
Start: 2021-11-29 — End: 2021-11-29

## 2021-11-29 MED ORDER — DARBEPOETIN ALFA 60 MCG/0.3ML IJ SOSY: 60.0000 ug | PREFILLED_SYRINGE | Freq: Once | INTRAMUSCULAR | Status: DC

## 2021-11-29 MED ORDER — TORSEMIDE 20 MG PO TABS: 40.0000 mg | ORAL_TABLET | Freq: Two times a day (BID) | ORAL | Status: DC

## 2021-11-29 MED ORDER — TORSEMIDE 20 MG PO TABS
40.0000 mg | ORAL_TABLET | Freq: Every day | ORAL | Status: DC
  Administered 2021-11-29: 12:00:00 40 mg via ORAL
  Filled 2021-11-29: qty 2

## 2021-11-29 MED ORDER — PROCHLORPERAZINE 25 MG RE SUPP
25.0000 mg | Freq: Two times a day (BID) | RECTAL | Status: DC | PRN
  Administered 2021-11-29: 19:00:00 25 mg via RECTAL
  Filled 2021-11-29 (×2): qty 1

## 2021-11-29 MED ORDER — ONDANSETRON HCL 4 MG/2ML IJ SOLN
4.0000 mg | Freq: Four times a day (QID) | INTRAMUSCULAR | Status: DC | PRN
  Administered 2021-11-29 – 2021-11-30 (×3): 4 mg via INTRAVENOUS
  Filled 2021-11-29 (×3): qty 2

## 2021-11-29 MED ORDER — DARBEPOETIN ALFA 100 MCG/0.5ML IJ SOSY
100.0000 ug | PREFILLED_SYRINGE | Freq: Once | INTRAMUSCULAR | Status: AC
  Administered 2021-11-29: 12:00:00 100 ug via SUBCUTANEOUS
  Filled 2021-11-29: qty 0.5

## 2021-11-29 NOTE — Progress Notes (Addendum)
PROGRESS NOTE    Diamond [name]  JAS:[id number] DOB: 02-08-1996 DOA: 11/10/2021 PCP: [name], PA-C    Brief Narrative:  25 year old female with a history of type 1 diabetes, nephrotic syndrome, presents to the hospital with uncontrolled blood sugars and significant volume overload/anasarca.  She does report compliance with her home diabetes medications as well as diuretics.  She was started on insulin infusion with improvement of blood sugars.  Also started on IV diuretics.  Nephrology following.   Assessment & Plan:   Principal Problem:   Hyperosmolar hyperglycemic state (HHS) ([place]) Active Problems:   Anasarca   Pressure injury of skin   Type 1 diabetes, uncontrolled with hyperglycemia -Started on insulin infusion on admission -Noted to have normal anion gap -Overall blood sugars have improved -She has been transitioned to subcutaneous insulin  Anasarca/volume overload in the setting of nephrotic syndrome -- biopsy-proven diabetic nephropathy with nephrotic syndrome, admitted with significant volume overload/anasarca and uncontrolled hyperglycemia. -Patient reports compliance with diuretics at home -His weight from 08/2021 noted to be 144 pounds in cardiology note. -Admission weight noted to be 178 pounds -Current weight 168 pounds -Continue daily weights -She was started on Lasix and albumin infusions -Nephrology following, appreciate input -will continue current management  Hypoxia -Noted by staff the patient's oxygen saturations in the low 80s -This was confirmed on multiple fingers. -2 L of oxygen have been applied. -Hypoxia resolved  Various neurologic complaints on 11/28/21 -- Please see progress note dated 11/28/2021 -She recently had CT head which was unremarkable On 11/29/21 -Neuro symptoms largely resolved however patient had nausea and vomiting which is not unusual for her she does have gastroparesis  AKI on CKD stage IIIa -Creatinine trended  up since admission to 2.57 -Baseline creatinine possibly around 1.1 -Of note, patient did receive intravenous contrast on 12/21 for CT of her orbits. -Renal ultrasound unremarkable -Restarted on albumin and IV Lasix -creatinine  at 1.9 -Continue to follow  Hypertension -Holding ACE inhibitor since creatinine appears to be trending up -She is continued on hydralazine  Right hip pain -X-ray unremarkable  Anemia -Likely related to chronic kidney disease -No reported bleeding -Continue to monitor  Vision loss, left eye -Patient reports that she has had progressive worsening of vision in her left eye for the past 2 to 3 weeks -Has felt that there is blood in her eye that is currently mobile but has progressively increased over the past few weeks -She was seen at [facility] twice in the past 2 weeks.  On second visit, there was concern for possible vitreous hemorrhage.  CT orbits done at that time did not show any acute findings -She has been unable to follow-up with a ophthalmologist -Feels that her vision has gotten worse last night to the point that she has difficulty making eye shapes with her left eye -Also complains of headache in her temples and occipital area which has been persistent for the last few days -CT head repeated that did not show any acute findings -Reviewed case with ophthalmology on-call, Dr. Lucianne Lei who felt that patient's symptoms likely related to vitreous hemorrhage.  Since symptoms have been progressive over the last 2 to 3 weeks, no urgent intervention is indicated at this time, although she will need to follow-up with ophthalmology when she is medically stable for discharge -Hemorrhage likely related to neovascularization and uncontrolled diabetes -This will likely need to be treated with photocoagulation in the office and cannot be managed as an inpatient.  Pressure injury, left  lateral thigh, stage 1, POA Pressure Injury 11/23/21 Thigh Left;Lateral Stage 1 -   Intact skin with non-blanchable redness of a localized area usually over a bony prominence. wound bed, pink with outer ridges intact. (Active)  11/23/21 1058  Location: Thigh  Location Orientation: Left;Lateral  Staging: Stage 1 -  Intact skin with non-blanchable redness of a localized area usually over a bony prominence.  Wound Description (Comments): wound bed, pink with outer ridges intact.  Present on Admission: Yes  -continue supportive measures.   Obesity -BMI 34.1 -Counseled on the importance of diet and exercise    DVT prophylaxis: Place TED hose Start: 11/27/21 0957 enoxaparin (LOVENOX) injection 40 mg Start: 11/21/2021 2000 SCDs Start: 11/04/2021 1941  Code Status: Full code Family Communication: discussed with patient, updated her aunt who is her guardian Disposition Plan: Status is: Inpatient  Remains inpatient appropriate because: Continued inpatient stay for IV diuresis and monitoring renal function    Consultants:  Nephrology  Procedures:    Antimicrobials:      Subjective:  -Nausea persist, oral intake is erratic -No fevers, no chest pains -Ambulating independently to the bathroom  Objective: Vitals:   11/29/21 0803 11/29/21 1135 11/29/21 1355 11/29/21 1829  BP: (!) 151/104 (!) 129/94 (!) 154/108 (!) 148/96  Pulse: (!) 113 (!) 111 98 (!) 112  Resp:   17   Temp:   98 F (36.7 C) 97.8 F (36.6 C)  TempSrc:    Oral  SpO2: 96%  100% 95%  Weight:      Height:        Intake/Output Summary (Last 24 hours) at 11/29/2021 2022 Last data filed at 11/29/2021 1300 Gross per 24 hour  Intake 340 ml  Output 3400 ml  Net -3060 ml   Filed Weights   11/27/21 0449 11/28/21 0500 11/29/21 0500  Weight: 75.4 kg 76.8 kg 76.6 kg    Examination:  General exam: Alert, awake, oriented x 3 Respiratory system: Clear to auscultation. Respiratory effort normal. Cardiovascular system:RRR. No murmurs, rubs, gallops. Gastrointestinal system: Abdomen is  nondistended, soft and nontender. No organomegaly or masses felt. Normal bowel sounds heard. Central nervous system: Alert and oriented.  Gait is steady when ambulating to the bathroom. Extremities: Continues to have significant anasarca Skin: No rashes, lesions or ulcers Psychiatry: Judgement and insight appear normal. Mood & affect appropriate.   Data Reviewed: I have personally reviewed following labs and imaging studies  CBC: Recent Labs  Lab 11/25/21 0447 11/26/21 0421 11/27/21 0410 11/28/21 0544 11/29/21 0525  WBC 9.5 8.2 8.3 9.9 8.8  HGB 9.7* 7.8* 7.7* 8.0* 7.0*  HCT 30.2* 24.9* 24.8* 25.6* 23.4*  MCV 93.5 92.2 95.4 95.5 97.9  PLT 410* 356 376 418* 935   Basic Metabolic Panel: Recent Labs  Lab 11/25/21 0447 11/26/21 0421 11/27/21 0410 11/28/21 0544 11/29/21 0525  NA 136 139 137 137 140  K 3.8 4.1 4.0 4.4 4.3  CL 104 104 100 101 103  CO2 24 24 25 30 31   GLUCOSE 115* 217* 217* 229* 176*  BUN 33* 37* 35* 32* 33*  CREATININE 2.57* 2.07* 1.88* 1.82* 1.90*  CALCIUM 8.2* 8.5* 8.6* 8.5* 8.7*  PHOS 4.9* 4.5 4.7* 4.4 4.1   GFR: Estimated Creatinine Clearance: 40.4 mL/min (A) (by C-G formula based on SCr of 1.9 mg/dL (H)). Liver Function Tests: Recent Labs  Lab 11/24/21 7017 11/25/21 0447 11/26/21 0421 11/27/21 0410 11/28/21 0544 11/29/21 0525  AST 26  --   --   --   --   --  ALT 19  --   --   --   --   --   ALKPHOS 86  --   --   --   --   --   BILITOT 0.6  --   --   --   --   --   PROT 6.5  --   --   --   --   --   ALBUMIN 3.1* 2.3* 2.8* 2.6* 2.7* 3.1*   No results for input(s): LIPASE, AMYLASE in the last 168 hours. No results for input(s): AMMONIA in the last 168 hours. Coagulation Profile: No results for input(s): INR, PROTIME in the last 168 hours. Cardiac Enzymes: No results for input(s): CKTOTAL, CKMB, CKMBINDEX, TROPONINI in the last 168 hours. BNP (last 3 results) No results for input(s): PROBNP in the last 8760 hours. HbA1C: No results for  input(s): HGBA1C in the last 72 hours.  CBG: Recent Labs  Lab 11/28/21 1742 11/29/21 0012 11/29/21 0719 11/29/21 1114 11/29/21 1635  GLUCAP 160* 198* 150* 157* 141*   Lipid Profile: No results for input(s): CHOL, HDL, LDLCALC, TRIG, CHOLHDL, LDLDIRECT in the last 72 hours. Thyroid Function Tests: No results for input(s): TSH, T4TOTAL, FREET4, T3FREE, THYROIDAB in the last 72 hours. Anemia Panel: No results for input(s): VITAMINB12, FOLATE, FERRITIN, TIBC, IRON, RETICCTPCT in the last 72 hours.  Sepsis Labs: No results for input(s): PROCALCITON, LATICACIDVEN in the last 168 hours.  Recent Results (from the past 240 hour(s))  Resp Panel by RT-PCR (Flu A&B, Covid) Nasopharyngeal Swab     Status: None   Collection Time: 11/11/2021  5:24 PM   Specimen: Nasopharyngeal Swab; Nasopharyngeal(NP) swabs in vial transport medium  Result Value Ref Range Status   SARS Coronavirus 2 by RT PCR NEGATIVE NEGATIVE Final    Comment: (NOTE) SARS-CoV-2 target nucleic acids are NOT DETECTED.  The SARS-CoV-2 RNA is generally detectable in upper respiratory specimens during the acute phase of infection. The lowest concentration of SARS-CoV-2 viral copies this assay can detect is 138 copies/mL. A negative result does not preclude SARS-Cov-2 infection and should not be used as the sole basis for treatment or other patient management decisions. A negative result may occur with  improper specimen collection/handling, submission of specimen other than nasopharyngeal swab, presence of viral mutation(s) within the areas targeted by this assay, and inadequate number of viral copies(<138 copies/mL). A negative result must be combined with clinical observations, patient history, and epidemiological information. The expected result is Negative.  Fact Sheet for Patients:  EntrepreneurPulse.com.au  Fact Sheet for Healthcare Providers:  IncredibleEmployment.be  This test  is no t yet approved or cleared by the [place] FDA and  has been authorized for detection and/or diagnosis of SARS-CoV-2 by FDA under an Emergency Use Authorization (EUA). This EUA will remain  in effect (meaning this test can be used) for the duration of the COVID-19 declaration under Section 564(b)(1) of the Act, 21 U.S.C.section 360bbb-3(b)(1), unless the authorization is terminated  or revoked sooner.       Influenza A by PCR NEGATIVE NEGATIVE Final   Influenza B by PCR NEGATIVE NEGATIVE Final    Comment: (NOTE) The Xpert Xpress SARS-CoV-2/FLU/RSV plus assay is intended as an aid in the diagnosis of influenza from Nasopharyngeal swab specimens and should not be used as a sole basis for treatment. Nasal washings and aspirates are unacceptable for Xpert Xpress SARS-CoV-2/FLU/RSV testing.  Fact Sheet for Patients: EntrepreneurPulse.com.au  Fact Sheet for Healthcare Providers: IncredibleEmployment.be  This test  is not yet approved or cleared by the [place] and has been authorized for detection and/or diagnosis of SARS-CoV-2 by FDA under an Emergency Use Authorization (EUA). This EUA will remain in effect (meaning this test can be used) for the duration of the COVID-19 declaration under Section 564(b)(1) of the Act, 21 U.S.C. section 360bbb-3(b)(1), unless the authorization is terminated or revoked.  Performed at [facility], [address] [place], [place] [address]       Radiology Studies: DG CHEST PORT 1 VIEW  Result Date: 11/28/2021 CLINICAL DATA:  Shortness of breath. EXAM: PORTABLE CHEST 1 VIEW COMPARISON:  Chest radiograph dated 11/21/2021. FINDINGS: Bilateral streaky atelectasis or infiltrate. Probable trace bilateral pleural effusions. No pneumothorax. The cardiac silhouette is within normal limits. No acute osseous pathology. No acute osseous pathology. IMPRESSION: Bilateral streaky atelectasis or infiltrate.  Electronically Signed   By: Anner Crete M.D.   On: 11/28/2021 20:05     Scheduled Meds:  enoxaparin (LOVENOX) injection  40 mg Subcutaneous Q24H   hydrALAZINE  25 mg Oral BID   insulin aspart  0-9 Units Subcutaneous TID WC   insulin glargine-yfgn  15 Units Subcutaneous BID   nystatin  1 application Topical TID   pantoprazole  40 mg Oral Daily   sodium chloride flush  10-40 mL Intracatheter Q12H   torsemide  40 mg Oral Daily   Continuous Infusions:    LOS: 7 days    Roxan Hockey, MD [facility]   If 7PM-7AM, please contact night-coverage [email or web address]  11/29/2021, 8:22 PM

## 2021-11-29 NOTE — Progress Notes (Addendum)
[name] NEPHROLOGY PROGRESS NOTE  Assessment/ Plan: Pt is a 25 y.o. yo female with type I DM, biopsy-proven diabetic nephropathy with nephrotic syndrome, admitted with significant volume overload/anasarca and uncontrolled hyperglycemia.  #Acute kidney injury on CKD stage IIIa: Seems to be hemodynamically mediated/reduced effective arterial blood volume.  She was treated with IV albumin and Lasix with some response.  She is nonoliguric.  The volume status is better but she still has some edema.  Noted creatinine level is mildly uptrending today.  I will switch from Lasix to torsemide.  I recommend her to follow-up with Dr. Theador Hawthorne in next 1 to 2 weeks.  Continue strict ins and outs, daily lab.  #Hyperosmolar hyperglycemic state: With history of type 1 diabetes.  Admitted with hyperglycemia and status post volume expansion.  Managing by primary team.  # Anemia due to iron deficiency: Received IV iron.  I will order a dose of Aranesp today.  Monitor hemoglobin.  # HTN/volume: Blood pressure variable.  Currently on hydralazine and volume managed by diuretics.  Diuretics adjusted as above.  She will need close monitoring of renal function, electrolytes and follow-up with her nephrologist Dr. Theador Hawthorne.   Subjective: Seen and examined.  Urine output around 1.7 L in 24 hours.  She denies nausea, vomiting, chest pain, shortness of breath.  Asking when she can go home.  Objective Vital signs in last 24 hours: Vitals:   11/29/21 0348 11/29/21 0500 11/29/21 0530 11/29/21 0803  BP: 129/84  (!) 138/92 (!) 151/104  Pulse: (!) 115  (!) 116 (!) 113  Resp: 18  17   Temp: 99.4 F (37.4 C)  99.2 F (37.3 C)   TempSrc:      SpO2: 92%  91% 96%  Weight:  76.6 kg    Height:       Weight change: -0.194 kg  Intake/Output Summary (Last 24 hours) at 11/29/2021 0932 Last data filed at 11/29/2021 0400 Gross per 24 hour  Intake 431.78 ml  Output 1750 ml  Net -1318.22 ml         Labs: Basic Metabolic Panel: Recent Labs  Lab 11/27/21 0410 11/28/21 0544 11/29/21 0525  NA 137 137 140  K 4.0 4.4 4.3  CL 100 101 103  CO2 25 30 31   GLUCOSE 217* 229* 176*  BUN 35* 32* 33*  CREATININE 1.88* 1.82* 1.90*  CALCIUM 8.6* 8.5* 8.7*  PHOS 4.7* 4.4 4.1    Liver Function Tests: Recent Labs  Lab 11/20/2021 1544 11/24/21 0621 11/25/21 0447 11/27/21 0410 11/28/21 0544 11/29/21 0525  AST 16 26  --   --   --   --   ALT 19 19  --   --   --   --   ALKPHOS 124 86  --   --   --   --   BILITOT 0.3 0.6  --   --   --   --   PROT 6.1* 6.5  --   --   --   --   ALBUMIN 1.8* 3.1*   < > 2.6* 2.7* 3.1*   < > = values in this interval not displayed.    No results for input(s): LIPASE, AMYLASE in the last 168 hours. No results for input(s): AMMONIA in the last 168 hours. CBC: Recent Labs  Lab 11/25/2021 1544 11/23/21 0618 11/25/21 0447 11/26/21 0421 11/27/21 0410 11/28/21 0544 11/29/21 0525  WBC 8.3   < > 9.5 8.2 8.3 9.9 8.8  NEUTROABS 5.8  --   --   --   --   --   --  HGB 10.6*   < > 9.7* 7.8* 7.7* 8.0* 7.0*  HCT 34.0*   < > 30.2* 24.9* 24.8* 25.6* 23.4*  MCV 95.2   < > 93.5 92.2 95.4 95.5 97.9  PLT 274   < > 410* 356 376 418* 400   < > = values in this interval not displayed.    Cardiac Enzymes: No results for input(s): CKTOTAL, CKMB, CKMBINDEX, TROPONINI in the last 168 hours. CBG: Recent Labs  Lab 11/28/21 1158 11/28/21 1637 11/28/21 1742 11/29/21 0012 11/29/21 0719  GLUCAP 160* 169* 160* 198* 150*     Iron Studies: No results for input(s): IRON, TIBC, TRANSFERRIN, FERRITIN in the last 72 hours. Studies/Results: DG CHEST PORT 1 VIEW  Result Date: 11/28/2021 CLINICAL DATA:  Shortness of breath. EXAM: PORTABLE CHEST 1 VIEW COMPARISON:  Chest radiograph dated 11/04/2021. FINDINGS: Bilateral streaky atelectasis or infiltrate. Probable trace bilateral pleural effusions. No pneumothorax. The cardiac silhouette is within normal limits. No acute  osseous pathology. No acute osseous pathology. IMPRESSION: Bilateral streaky atelectasis or infiltrate. Electronically Signed   By: Anner Crete M.D.   On: 11/28/2021 20:05    Medications: Infusions:    Scheduled Medications:  darbepoetin (ARANESP) injection - NON-DIALYSIS  100 mcg Subcutaneous Once   enoxaparin (LOVENOX) injection  40 mg Subcutaneous Q24H   hydrALAZINE  25 mg Oral BID   insulin aspart  0-9 Units Subcutaneous TID WC   insulin glargine-yfgn  15 Units Subcutaneous BID   nystatin  1 application Topical TID   pantoprazole  40 mg Oral Daily   sodium chloride flush  10-40 mL Intracatheter Q12H   torsemide  40 mg Oral Daily    have reviewed scheduled and prn medications.  Physical Exam: General: Not in distress, able to lie flat Heart:RRR, s1s2 nl Lungs: Clear bilateral, no wheeze or crackle Abdomen:soft, Non-tender, non-distended Extremities: Lower extremities edema present Neurology: Alert, awake, oriented and nonfocal.  Alley Neils Tanna Furry 11/29/2021,9:32 AM  LOS: 7 days

## 2021-11-29 NOTE — Progress Notes (Signed)
°   11/29/21 1829  Vitals  Temp 97.8 F (36.6 C)  Temp Source Oral  BP (!) 148/96  MAP (mmHg) 111  BP Method Automatic  Pulse Rate (!) 112  Pulse Rate Source Monitor  MEWS COLOR  MEWS Score Color Yellow  Oxygen Therapy  SpO2 95 %  MEWS Score  MEWS Temp 0  MEWS Systolic 0  MEWS Pulse 2  MEWS RR 0  MEWS LOC 0  MEWS Score 2  Provider Notification  Provider Name/Title MD Courage  Date Provider Notified 11/29/21  Time Provider Notified [phone]   Awaiting provider response.

## 2021-11-30 ENCOUNTER — Telehealth: Payer: Self-pay

## 2021-11-30 ENCOUNTER — Inpatient Hospital Stay (HOSPITAL_COMMUNITY): Payer: Medicaid Other

## 2021-11-30 DIAGNOSIS — N183 Chronic kidney disease, stage 3 unspecified: Secondary | ICD-10-CM | POA: Diagnosis present

## 2021-11-30 LAB — HEPATIC FUNCTION PANEL
ALT: 18 U/L (ref 0–44)
AST: 18 U/L (ref 15–41)
Albumin: 2.9 g/dL — ABNORMAL LOW (ref 3.5–5.0)
Alkaline Phosphatase: 67 U/L (ref 38–126)
Bilirubin, Direct: <0.1 mg/dL (ref 0.0–0.2)
Total Bilirubin: 0.7 mg/dL (ref 0.3–1.2)
Total Protein: 5.7 g/dL — ABNORMAL LOW (ref 6.5–8.1)

## 2021-11-30 LAB — RENAL FUNCTION PANEL
Albumin: 2.9 g/dL — ABNORMAL LOW (ref 3.5–5.0)
Anion gap: 7 (ref 5–15)
BUN: 34 mg/dL — ABNORMAL HIGH (ref 6–20)
CO2: 31 mmol/L (ref 22–32)
Calcium: 8.7 mg/dL — ABNORMAL LOW (ref 8.9–10.3)
Chloride: 102 mmol/L (ref 98–111)
Creatinine, Ser: 2.18 mg/dL — ABNORMAL HIGH (ref 0.44–1.00)
GFR, Estimated: 31 mL/min — ABNORMAL LOW (ref >60)
Glucose, Bld: 142 mg/dL — ABNORMAL HIGH (ref 70–99)
Phosphorus: 5 mg/dL — ABNORMAL HIGH (ref 2.5–4.6)
Potassium: 4.7 mmol/L (ref 3.5–5.1)
Sodium: 140 mmol/L (ref 135–145)

## 2021-11-30 LAB — CBC
HCT: 23.8 % — ABNORMAL LOW (ref 36.0–46.0)
Hemoglobin: 7.3 g/dL — ABNORMAL LOW (ref 12.0–15.0)
MCH: 31.1 pg (ref 26.0–34.0)
MCHC: 30.7 g/dL (ref 30.0–36.0)
MCV: 101.3 fL — ABNORMAL HIGH (ref 80.0–100.0)
Platelets: 383 10*3/uL (ref 150–400)
RBC: 2.35 MIL/uL — ABNORMAL LOW (ref 3.87–5.11)
RDW: 15.2 % (ref 11.5–15.5)
WBC: 8.3 10*3/uL (ref 4.0–10.5)
nRBC: 0 % (ref 0.0–0.2)

## 2021-11-30 LAB — GLUCOSE, CAPILLARY
Glucose-Capillary: 141 mg/dL — ABNORMAL HIGH (ref 70–99)
Glucose-Capillary: 154 mg/dL — ABNORMAL HIGH (ref 70–99)
Glucose-Capillary: 217 mg/dL — ABNORMAL HIGH (ref 70–99)

## 2021-12-03 NOTE — Telephone Encounter (Signed)
Dr. Abbey Chatters One of my church members just texted me and let me know this pt passed this morning. I thought I would pass this along.

## 2021-12-03 NOTE — Discharge Summary (Signed)
° °  26 year old female with a history of type 1 diabetes, nephrotic syndrome, presents to the hospital with uncontrolled blood sugars and significant volume overload/anasarca.  She does report compliance with her home diabetes medications as well as diuretics.  She was started on insulin infusion with improvement of blood sugars. -Anasarca/volume overload in the setting of nephrotic syndrome -- biopsy-proven diabetic nephropathy with nephrotic syndrome, admitted with significant volume overload/anasarca and uncontrolled hyperglycemia.   - IV diuretics and albumin were administered with nephrology service consulted  I was alerted by Dr. Lawson Radar (Nephrologist)--- around 9 AM about change in pts mental status this am during his visit with pt today - I arrived the room at 0923 am and found pt pulseless and unresponsive without spontaneous respirations.   - I started CPR immediately at [phone] and pulled code button---- - Extra Help arrived quickly---on monitor patient was in asystole--- - CODE BLUE/ACLS protocol was followed - Patient got 6 rounds of IV epinephrine via a right arm midline -Patient got IV bicarb and IV calcium chloride - EDP Dr. Karle Starch intubated patient -Respiratory therapist helped to manage airway - Despite resuscitative efforts patient never regained a pulse, remained in asystole, never regained spontaneous respirations, and remained unresponsive   -Phone call to patient's legal guardian Ms. Tamera Punt and pt's cousin Lyndon Code- - Time of death 9:46 AM   - -Patient had pretty poor overall prognosis given underlying comorbid conditions and frequent admissions to the hospital--Including poorly controlled type I DM with gastroparesis and recurrent episodes of intractable emesis, CKD stage IIIa with superimposed AKI, biopsy-proven diabetic nephropathy with nephrotic syndrome and significant volume overload/anasarca and uncontrolled hyperglycemia, h/o  DVT,poorly controlled HTN,mood disorder, h/o Crohn's disease, anemia of CKD and anemia of iron deficiency as well    -Patient was last seen alive by nephrologist Dr. Lawson Radar around  9 AM -given fevers (Temp > 101) and change in mentation I had ordered chest x-ray, UA, CBC, and other labs -I made my way to patient's bedside around 9:23 AM and found  her unresponsive and pulseless   - Please see separate documentation for CODE BLUE/ACLS protocol

## 2021-12-03 NOTE — Progress Notes (Signed)
°  I was alerted by Dr. Lawson Radar (Nephrologist)--- around 9 AM about change in pts mental status this am during his visit with pt today - I arrived the room at 0923 am and found pt pulseless and unresponsive without spontaneous respirations.  - I started CPR immediately at [phone] and pulled code button---- - Extra Help arrived quickly---on monitor patient was in asystole--- - CODE BLUE/ACLS protocol was followed - Patient got 6 rounds of IV epinephrine via a right arm midline -Patient got IV bicarb and IV calcium chloride - EDP Dr. Karle Starch intubated patient -Respiratory therapist helped to manage airway - Despite resuscitative efforts patient never regained a pulse, remained in asystole, never regained spontaneous respirations, and remained unresponsive  -Phone call to patient's legal guardian Ms. Tamera Punt and pt's cousin Lyndon Code- - Time of death 9:46 AM  - -Patient had pretty poor overall prognosis given underlying comorbid conditions and frequent admissions to the hospital--Including poorly controlled type I DM with gastroparesis and recurrent episodes of intractable emesis, CKD stage IIIa with superimposed AKI, biopsy-proven diabetic nephropathy with nephrotic syndrome and significant volume overload/anasarca and uncontrolled hyperglycemia, h/o DVT,poorly controlled HTN,mood disorder, h/o Crohn's disease, anemia of CKD and anemia of iron deficiency as well   -Patient was last seen alive by nephrologist Dr. Lawson Radar around  9 AM -given fevers (Temp > 101) and change in mentation I had ordered chest x-ray, UA, CBC, and other labs -I made my way to patient's bedside around 9:23 AM and found  her unresponsive and pulseless  - Please see separate documentation for CODE BLUE/ACLS protocol  -- Total care time including time spent discussing patient's demise with family after she was pronounced at 9:46 AM is  about 30 minutes  Roxan Hockey, MD

## 2021-12-03 NOTE — Death Summary Note (Signed)
DEATH SUMMARY   Patient Details  Name: Diamond [name] MRN: [id number] DOB: [birth year]  Admission/Discharge Information   Admit Date:  12/07/2021  Date of Death: Date of Death: 12/15/21  Time of Death: Time of Death: 0946  Length of Stay: 25-Jan-2023  Referring Physician: [name], PA-C   Reason(s) for Hospitalization  -26 year old female with a history of type 1 diabetes, nephrotic syndrome, presents to the hospital with uncontrolled blood sugars and significant volume overload/anasarca.  She does report compliance with her home diabetes medications as well as diuretics.  She was started on insulin infusion with improvement of blood sugars. -Anasarca/volume overload in the setting of nephrotic syndrome -- biopsy-proven diabetic nephropathy with nephrotic syndrome, admitted with significant volume overload/anasarca and uncontrolled hyperglycemia.   - IV diuretics and albumin were administered with nephrology service consulted  I was alerted by Dr. Lawson Radar (Nephrologist)--- around 9 AM about change in pts mental status this am during his visit with pt today - I arrived the room at 0923 am and found pt pulseless and unresponsive without spontaneous respirations.   - I started CPR immediately at [phone] and pulled [name] button---- - Extra Help arrived quickly---on monitor patient was in asystole--- - [name] BLUE/ACLS protocol was followed - Patient got 6 rounds of IV epinephrine via a right arm midline -Patient got IV bicarb and IV calcium chloride - EDP Dr. Karle Starch intubated patient -Respiratory therapist helped to manage airway - Despite resuscitative efforts patient never regained a pulse, remained in asystole, never regained spontaneous respirations, and remained unresponsive   -Phone call to patient's legal guardian Ms. Diamond [name] and pt's cousin Diamond [name]- - Time of death 9:46 AM   - -Patient had pretty poor overall prognosis given underlying comorbid  conditions and frequent admissions to the hospital--Including poorly controlled type I DM with gastroparesis and recurrent episodes of intractable emesis, CKD stage IIIa with superimposed AKI, biopsy-proven diabetic nephropathy with nephrotic syndrome and significant volume overload/anasarca and uncontrolled hyperglycemia, h/o DVT,poorly controlled HTN,mood disorder, h/o Crohn's disease, anemia of CKD and anemia of iron deficiency as well    -Patient was last seen alive by nephrologist Dr. Lawson Radar around  9 AM -given fevers (Temp > 101) and change in mentation I had ordered chest x-ray, UA, CBC, and other labs -I made my way to patient's bedside around 9:23 AM and found  her unresponsive and pulseless   - Please see separate documentation for [name] BLUE/ACLS protocol  Diagnoses  Preliminary cause of death:  Secondary Diagnoses (including complications and co-morbidities):  Principal Problem:   Hyperosmolar hyperglycemic state (HHS) ([place]) Active Problems:   DM1 with Diabetic nephropathy with proteinuria and Nephrotic Syndrome-   AKI (acute kidney injury) on CKD IIIA   Anasarca   Essential hypertension, benign   DKA, type 1 ([place])   CKD (chronic kidney disease), stage IIIA ([place])   Pressure injury of skin   Brief Hospital Course (including significant findings, care, treatment, and services provided and events leading to death)  Diamond [name] is a 26 y.o. year old female   26 year old female with a history of type 1 diabetes, nephrotic syndrome, presents to the hospital with uncontrolled blood sugars and significant volume overload/anasarca.  She does report compliance with her home diabetes medications as well as diuretics.  She was started on insulin infusion with improvement of blood sugars. -Anasarca/volume overload in the setting of nephrotic syndrome -- biopsy-proven diabetic nephropathy with nephrotic syndrome, admitted with significant volume overload/anasarca and  uncontrolled  hyperglycemia.   - IV diuretics and albumin were administered with nephrology service consulted  I was alerted by Dr. Lawson Radar (Nephrologist)--- around 9 AM about change in pts mental status this am during his visit with pt today - I arrived the room at 0923 am and found pt pulseless and unresponsive without spontaneous respirations.   - I started CPR immediately at [phone] and pulled [name] button---- - Extra Help arrived quickly---on monitor patient was in asystole--- - [name] BLUE/ACLS protocol was followed - Patient got 6 rounds of IV epinephrine via a right arm midline -Patient got IV bicarb and IV calcium chloride - EDP Dr. Karle Starch intubated patient -Respiratory therapist helped to manage airway - Despite resuscitative efforts patient never regained a pulse, remained in asystole, never regained spontaneous respirations, and remained unresponsive   -Phone call to patient's legal guardian Ms. Diamond [name] and pt's cousin Diamond [name]- - Time of death 9:46 AM   - -Patient had pretty poor overall prognosis given underlying comorbid conditions and frequent admissions to the hospital--Including poorly controlled type I DM with gastroparesis and recurrent episodes of intractable emesis, CKD stage IIIa with superimposed AKI, biopsy-proven diabetic nephropathy with nephrotic syndrome and significant volume overload/anasarca and uncontrolled hyperglycemia, h/o DVT,poorly controlled HTN,mood disorder, h/o Crohn's disease, anemia of CKD and anemia of iron deficiency as well    -Patient was last seen alive by nephrologist Dr. Lawson Radar around  9 AM -given fevers (Temp > 101) and change in mentation I had ordered chest x-ray, UA, CBC, and other labs -I made my way to patient's bedside around 9:23 AM and found  her unresponsive and pulseless   - Please see separate documentation for [name] BLUE/ACLS protocol  Pertinent Labs and Studies  Significant Diagnostic  Studies DG Chest 2 View  Result Date: 11/21/2021 CLINICAL DATA:  Shortness of breath, anasarca EXAM: CHEST - 2 VIEW COMPARISON:  Chest radiograph 11/21/2021 FINDINGS: The cardiomediastinal silhouette is stable. Lung volumes remain low. There are patchy opacities in the lung bases, worsened on the right and not significantly changed on the left. The upper lungs remain well aerated. There is no overt pulmonary edema there is no significant pleural effusion. There is no pneumothorax. There is no acute osseous abnormality. IMPRESSION: 1. Low lung volumes with patchy opacities in the bases, slightly worsened on the right in the interim, which could reflect atelectasis, infection, or aspiration. 2. No overt pulmonary edema or pleural effusion. Electronically Signed   By: Valetta Mole M.D.   On: 11/23/2021 16:40   CT HEAD WO CONTRAST (5MM)  Result Date: 11/25/2021 CLINICAL DATA:  Monocular vision loss EXAM: CT HEAD WITHOUT CONTRAST TECHNIQUE: Contiguous axial images were obtained from the base of the skull through the vertex without intravenous contrast. COMPARISON:  11/21/2021.  09/06/2021. FINDINGS: Brain: The brain shows a normal appearance without evidence of malformation, atrophy, old or acute small or large vessel infarction, mass lesion, hemorrhage, hydrocephalus or extra-axial collection. Vascular: No hyperdense vessel. No evidence of atherosclerotic calcification. Skull: Normal.  No traumatic finding.  No focal bone lesion. Sinuses/Orbits: Sinuses are clear. Orbits appear normal. Mastoids are clear. Other: None significant IMPRESSION: No change from previous examinations.  Normal head CT. Electronically Signed   By: Nelson Chimes M.D.   On: 11/25/2021 13:47   US RENAL  Result Date: 11/23/2021 CLINICAL DATA:  AK I EXAM: RENAL / URINARY TRACT ULTRASOUND COMPLETE COMPARISON:  Abdominal ultrasound 03/18/2021 FINDINGS: Right Kidney: Renal measurements: 11 x 5 x 6.4 cm =  volume: 183 mL. Mildly increased  echogenicity. No hydronephrosis or focal mass identified. Left Kidney: Renal measurements: 11.2 x 6.3 x 6.2 cm = volume: 227 mL. Mildly increased echogenicity. No hydronephrosis or focal mass identified. Bladder: Appears normal for degree of bladder distention. Other: None. IMPRESSION: Increased echogenicity of the kidneys suggesting medical renal disease. No hydronephrosis. Electronically Signed   By: Ofilia Neas M.D.   On: 11/23/2021 10:39   DG CHEST PORT 1 VIEW  Result Date: 11/28/2021 CLINICAL DATA:  Shortness of breath. EXAM: PORTABLE CHEST 1 VIEW COMPARISON:  Chest radiograph dated 11/16/2021. FINDINGS: Bilateral streaky atelectasis or infiltrate. Probable trace bilateral pleural effusions. No pneumothorax. The cardiac silhouette is within normal limits. No acute osseous pathology. No acute osseous pathology. IMPRESSION: Bilateral streaky atelectasis or infiltrate. Electronically Signed   By: Anner Crete M.D.   On: 11/28/2021 20:05   DG HIP UNILAT WITH PELVIS 2-3 VIEWS RIGHT  Result Date: 11/26/2021 CLINICAL DATA:  Atraumatic right hip pain. EXAM: DG HIP (WITH OR WITHOUT PELVIS) 2-3V RIGHT COMPARISON:  None. FINDINGS: There is no evidence of hip fracture or dislocation. There is no evidence of arthropathy or other focal bone abnormality. IMPRESSION: Negative. Electronically Signed   By: Virgina Norfolk M.D.   On: 11/15/2021 19:20    Microbiology Recent Results (from the past 240 hour(s))  Resp Panel by RT-PCR (Flu A&B, Covid) Nasopharyngeal Swab     Status: None   Collection Time: 11/24/2021  5:24 PM   Specimen: Nasopharyngeal Swab; Nasopharyngeal(NP) swabs in vial transport medium  Result Value Ref Range Status   SARS Coronavirus 2 by RT PCR NEGATIVE NEGATIVE Final    Comment: (NOTE) SARS-CoV-2 target nucleic acids are NOT DETECTED.  The SARS-CoV-2 RNA is generally detectable in upper respiratory specimens during the acute phase of infection. The lowest concentration of  SARS-CoV-2 viral copies this assay can detect is 138 copies/mL. A negative result does not preclude SARS-Cov-2 infection and should not be used as the sole basis for treatment or other patient management decisions. A negative result may occur with  improper specimen collection/handling, submission of specimen other than nasopharyngeal swab, presence of viral mutation(s) within the areas targeted by this assay, and inadequate number of viral copies(<138 copies/mL). A negative result must be combined with clinical observations, patient history, and epidemiological information. The expected result is Negative.  Fact Sheet for Patients:  EntrepreneurPulse.com.au  Fact Sheet for Healthcare Providers:  IncredibleEmployment.be  This test is no t yet approved or cleared by the [place] FDA and  has been authorized for detection and/or diagnosis of SARS-CoV-2 by FDA under an Emergency Use Authorization (EUA). This EUA will remain  in effect (meaning this test can be used) for the duration of the COVID-19 declaration under Section 564(b)(1) of the Act, 21 U.S.C.section 360bbb-3(b)(1), unless the authorization is terminated  or revoked sooner.       Influenza A by PCR NEGATIVE NEGATIVE Final   Influenza B by PCR NEGATIVE NEGATIVE Final    Comment: (NOTE) The Xpert Xpress SARS-CoV-2/FLU/RSV plus assay is intended as an aid in the diagnosis of influenza from Nasopharyngeal swab specimens and should not be used as a sole basis for treatment. Nasal washings and aspirates are unacceptable for Xpert Xpress SARS-CoV-2/FLU/RSV testing.  Fact Sheet for Patients: EntrepreneurPulse.com.au  Fact Sheet for Healthcare Providers: IncredibleEmployment.be  This test is not yet approved or cleared by the [place] FDA and has been authorized for detection and/or diagnosis of SARS-CoV-2 by FDA under an  Emergency Use  Authorization (EUA). This EUA will remain in effect (meaning this test can be used) for the duration of the COVID-19 declaration under Section 564(b)(1) of the Act, 21 U.S.C. section 360bbb-3(b)(1), unless the authorization is terminated or revoked.  Performed at [facility], [address] [place], [place] [address]     Lab Basic Metabolic Panel: Recent Labs  Lab 11/26/21 0421 11/27/21 0410 11/28/21 0544 11/29/21 0525 12-07-2021 0502  NA 139 137 137 140 140  K 4.1 4.0 4.4 4.3 4.7  CL 104 100 101 103 102  CO2 24 25 30 31 31   GLUCOSE 217* 217* 229* 176* 142*  BUN 37* 35* 32* 33* 34*  CREATININE 2.07* 1.88* 1.82* 1.90* 2.18*  CALCIUM 8.5* 8.6* 8.5* 8.7* 8.7*  PHOS 4.5 4.7* 4.4 4.1 5.0*   Liver Function Tests: Recent Labs  Lab 11/24/21 0621 11/25/21 0447 11/27/21 0410 11/28/21 0544 11/29/21 0525 12/07/21 0502 12/07/2021 0741  AST 26  --   --   --   --   --  18  ALT 19  --   --   --   --   --  18  ALKPHOS 86  --   --   --   --   --  67  BILITOT 0.6  --   --   --   --   --  0.7  PROT 6.5  --   --   --   --   --  5.7*  ALBUMIN 3.1*   < > 2.6* 2.7* 3.1* 2.9* 2.9*   < > = values in this interval not displayed.   No results for input(s): LIPASE, AMYLASE in the last 168 hours. No results for input(s): AMMONIA in the last 168 hours. CBC: Recent Labs  Lab 11/26/21 0421 11/27/21 0410 11/28/21 0544 11/29/21 0525 2021-12-07 0741  WBC 8.2 8.3 9.9 8.8 8.3  HGB 7.8* 7.7* 8.0* 7.0* 7.3*  HCT 24.9* 24.8* 25.6* 23.4* 23.8*  MCV 92.2 95.4 95.5 97.9 101.3*  PLT 356 376 418* 400 383   Cardiac Enzymes: No results for input(s): CKTOTAL, CKMB, CKMBINDEX, TROPONINI in the last 168 hours. Sepsis Labs: Recent Labs  Lab 11/27/21 0410 11/28/21 0544 11/29/21 0525 December 07, 2021 0741  WBC 8.3 9.9 8.8 8.3    Procedures/Operations  [name] Blue Rt Arm Midline   Alper Guilmette 12-07-2021, 5:09 PM

## 2021-12-03 NOTE — ED Provider Notes (Signed)
Procedure Name: Intubation Date/Time: 12/28/2021 9:47 AM Performed by: Truddie Hidden, MD Oxygen Delivery Method: Ambu bag Laryngoscope Size: Mac and 4 Grade View: Grade III Tube size: 7.5 mm Number of attempts: 2 Placement Confirmation: CO2 detector and Breath sounds checked- equal and bilateral Tube secured with: Tape       Truddie Hidden, MD December 28, 2021 [phone]

## 2021-12-03 NOTE — Progress Notes (Signed)
Assisted in Code Response on this patient as  Time recorder. Code called at [phone]. CPR was started immediately. Physician, Dr. Joesph Fillers in room during entire event.  Took over as recorder at 928. At 0768 - Epinephrine was given IV. At 930, a pulse check was done, no pulse.  At 931 - Epi given IV.  At 932 - Pulse check with doppler & monitor. At 933 - Bicarb given IV. At 935 - Calcium given IV.  CPR maintained.  At 938, Epi given IV.  At 939, pulse check with doppler & monitor.  CPR maintained.  At 940, Epi given IV.  Pulse check was completed with doppler & monitor at 941. CPR maintained.  At 943 Epi given IV.  CPR continued & maintained. Pulse check completed with doppler and monitor & 946.  After reaching family and without regaining any pulse or rhythm, time of death called by Dr. Joesph Fillers. CPR stopped at that time.

## 2021-12-03 NOTE — Progress Notes (Signed)
[facility]PH[facility] PROGRESS NOTE  Assessment/ Plan: Pt is a 26 y.o. yo female with type I DM, biopsy-proven diabetic nephropathy with nephrotic syndrome (Dr. Theador Hawthorne), admitted with significant volume overload/anasarca and uncontrolled hyperglycemia.  #Acute kidney injury on CKD stage IIIa: Seems to be hemodynamically mediated/reduced effective arterial blood volume.  She was treated with IV albumin and Lasix with some response.  She is nonoliguric.  The volume status is better but she still has some edema.   Noted the patient is febrile and creatinine level trended up today.  I will hold diuretics today. Continue with strict ins and outs, daily lab.  #Hyperosmolar hyperglycemic state: With history of type 1 diabetes.  Admitted with hyperglycemia and status post volume expansion.  Managing by primary team.  # Anemia due to iron deficiency: Received IV iron.  Also received Aranesp on 12/28.  Monitor hemoglobin.  Transfuse as needed.  # HTN/volume: Blood pressure variable.  Currently on hydralazine and volume managed by diuretics.  #Acute febrile illness: Patient had temperature of 101.2 this morning and looks somnolent.  I discussed this with primary team Dr Joesph Fillers.  Further management of fever including x-ray, cultures etc. defer to primary team.  Subjective: Seen and examined.  Urine output is recorded around 2.4 L in 24 hours.  Patient had temperature of 101.2 this morning.  She looks somnolent compared to yesterday.  Review of system is limited.  Objective Vital signs in last 24 hours: Vitals:   11/29/21 2154 11/29/21 2155 2021/12/03 0541 12/03/2021 0610  BP: (!) 147/80 (!) 147/80 116/84   Pulse:  (!) 110 (!) 113   Resp:  20 20   Temp:  99 F (37.2 C) (!) 101.2 F (38.4 C)   TempSrc:  Oral    SpO2:  91% 90%   Weight:    76 kg  Height:       Weight change: -0.6 kg  Intake/Output Summary (Last 24 hours) at 12-03-21 0853 Last data filed at 11/29/2021  1300 Gross per 24 hour  Intake 240 ml  Output 2400 ml  Net -2160 ml        Labs: Basic Metabolic Panel: Recent Labs  Lab 11/28/21 0544 11/29/21 0525 Dec 03, 2021 0502  NA 137 140 140  K 4.4 4.3 4.7  CL 101 103 102  CO2 30 31 31   GLUCOSE 229* 176* 142*  BUN 32* 33* 34*  CREATININE 1.82* 1.90* 2.18*  CALCIUM 8.5* 8.7* 8.7*  PHOS 4.4 4.1 5.0*    Liver Function Tests: Recent Labs  Lab 11/24/21 0621 11/25/21 0447 11/28/21 0544 11/29/21 0525 December 03, 2021 0502  AST 26  --   --   --   --   ALT 19  --   --   --   --   ALKPHOS 86  --   --   --   --   BILITOT 0.6  --   --   --   --   PROT 6.5  --   --   --   --   ALBUMIN 3.1*   < > 2.7* 3.1* 2.9*   < > = values in this interval not displayed.    No results for input(s): LIPASE, AMYLASE in the last 168 hours. No results for input(s): AMMONIA in the last 168 hours. CBC: Recent Labs  Lab 11/25/21 0447 11/26/21 0421 11/27/21 0410 11/28/21 0544 11/29/21 0525  WBC 9.5 8.2 8.3 9.9 8.8  HGB 9.7* 7.8* 7.7* 8.0* 7.0*  HCT 30.2* 24.9* 24.8*  25.6* 23.4*  MCV 93.5 92.2 95.4 95.5 97.9  PLT 410* 356 376 418* 400    Cardiac Enzymes: No results for input(s): CKTOTAL, CKMB, CKMBINDEX, TROPONINI in the last 168 hours. CBG: Recent Labs  Lab 11/29/21 0719 11/29/21 1114 11/29/21 1635 2021/12/08 0059 Dec 08, 2021 0741  GLUCAP 150* 157* 141* 141* 154*     Iron Studies: No results for input(s): IRON, TIBC, TRANSFERRIN, FERRITIN in the last 72 hours. Studies/Results: DG CHEST PORT 1 VIEW  Result Date: 11/28/2021 CLINICAL DATA:  Shortness of breath. EXAM: PORTABLE CHEST 1 VIEW COMPARISON:  Chest radiograph dated 11/26/2021. FINDINGS: Bilateral streaky atelectasis or infiltrate. Probable trace bilateral pleural effusions. No pneumothorax. The cardiac silhouette is within normal limits. No acute osseous pathology. No acute osseous pathology. IMPRESSION: Bilateral streaky atelectasis or infiltrate. Electronically Signed   By: Anner Crete M.D.   On: 11/28/2021 20:05    Medications: Infusions:    Scheduled Medications:  enoxaparin (LOVENOX) injection  40 mg Subcutaneous Q24H   hydrALAZINE  25 mg Oral BID   insulin aspart  0-9 Units Subcutaneous TID WC   insulin glargine-yfgn  15 Units Subcutaneous BID   nystatin  1 application Topical TID   pantoprazole  40 mg Oral Daily   sodium chloride flush  10-40 mL Intracatheter Q12H    have reviewed scheduled and prn medications.  Physical Exam: General: Looks more somnolent today. Heart:RRR, s1s2 nl Lungs: Some basal rhonchi, no increased work of breathing. Abdomen:soft, Non-tender, non-distended Extremities: Lower extremities edema present Neurology: Alert, awake but lethargic  Diamond [name] Diamond [name] Dec 08, 2021,8:53 AM  LOS: 8 days

## 2021-12-03 DEATH — deceased

## 2021-12-04 MED FILL — Medication: Qty: 1 | Status: AC

## 2021-12-18 ENCOUNTER — Ambulatory Visit: Payer: Medicaid Other | Admitting: "Endocrinology

## 2022-01-09 ENCOUNTER — Ambulatory Visit: Payer: Medicaid Other | Admitting: Physician Assistant

## 2022-10-03 IMAGING — DX DG CHEST 1V PORT
1 series · 1 of 1 positions shown · non-contrast
Comparison: 09/29/2020.

CLINICAL DATA: Shortness of breath.

EXAM:
PORTABLE CHEST 1 VIEW

[chest ap]
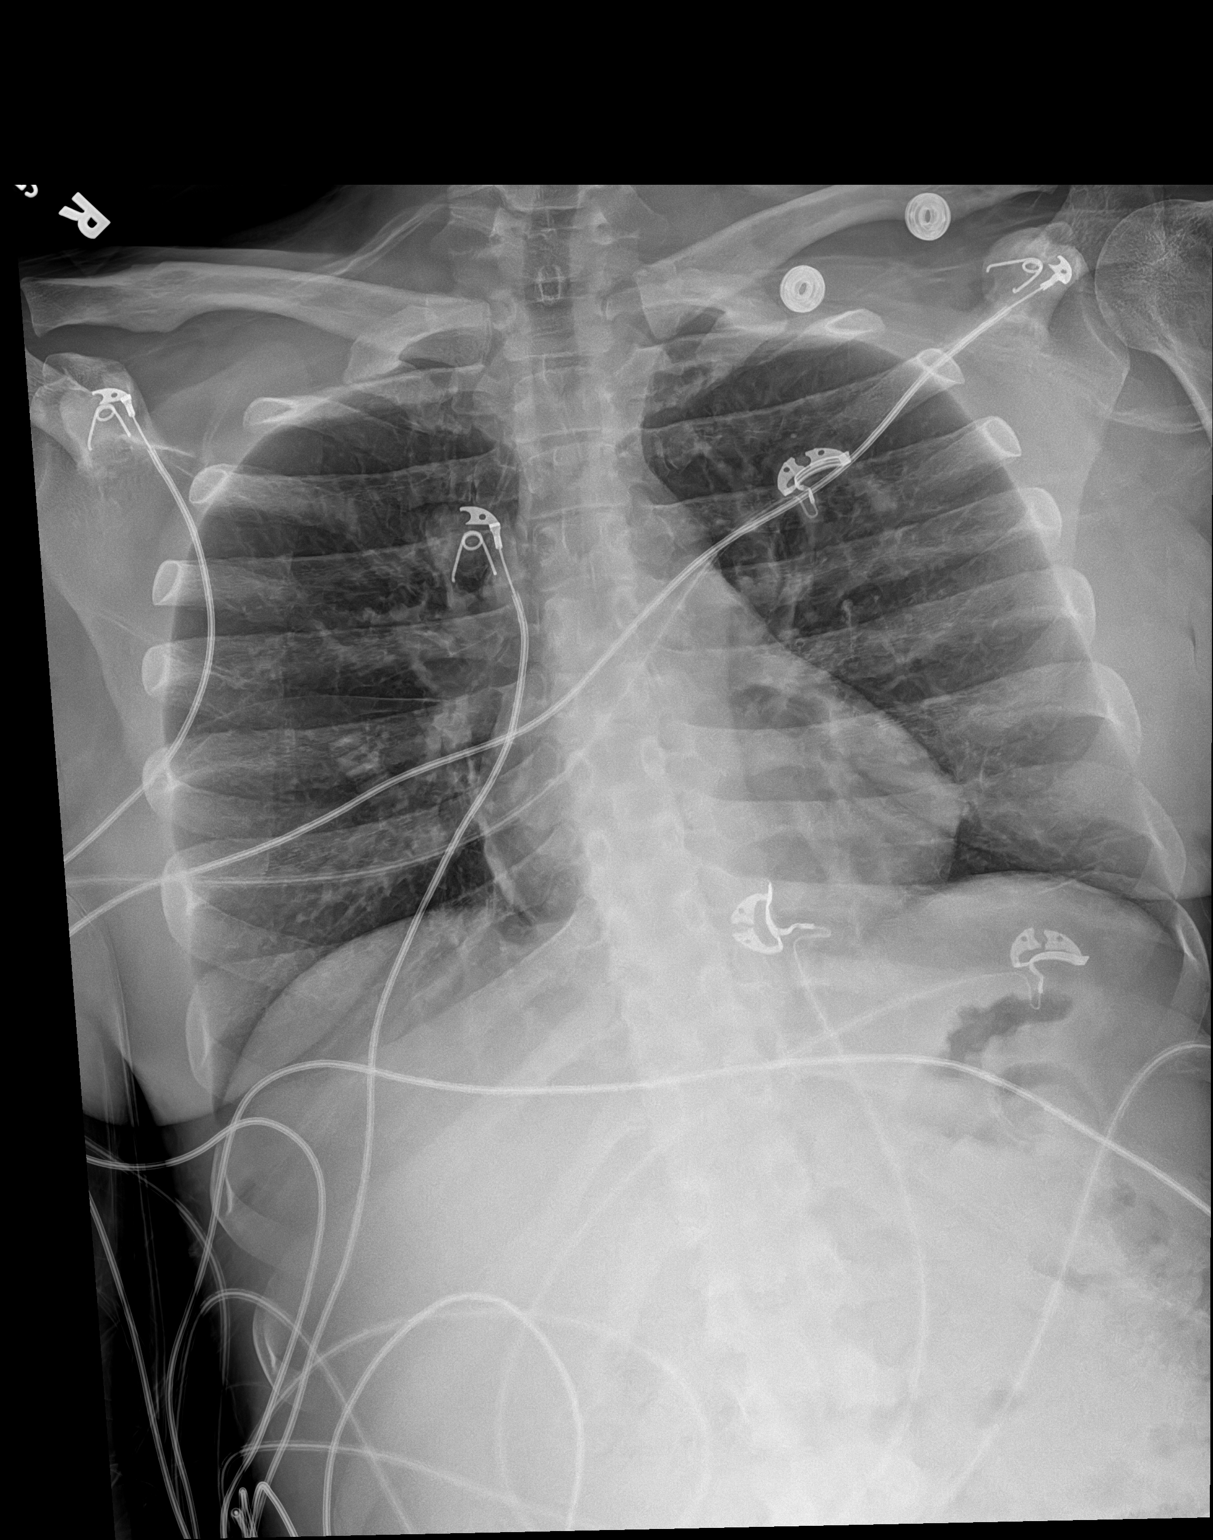

[1 of 1 positions shown; findings below may reference images not displayed]

FINDINGS: The heart size and mediastinal contours are within normal limits.
Both lungs are clear. No visible pleural effusions or pneumothorax.
The visualized skeletal structures are unremarkable.
IMPRESSION: No active disease.

## 2022-10-03 IMAGING — US US ABDOMEN COMPLETE
1 series · 14 of 25 positions shown · non-contrast
Comparison: None.

CLINICAL DATA: 24-year-old female with abdominal distension.

EXAM:
ABDOMEN ULTRASOUND COMPLETE

[Series 1: us abdomen complete · 14 of 111 slices shown]
[im 1/111]
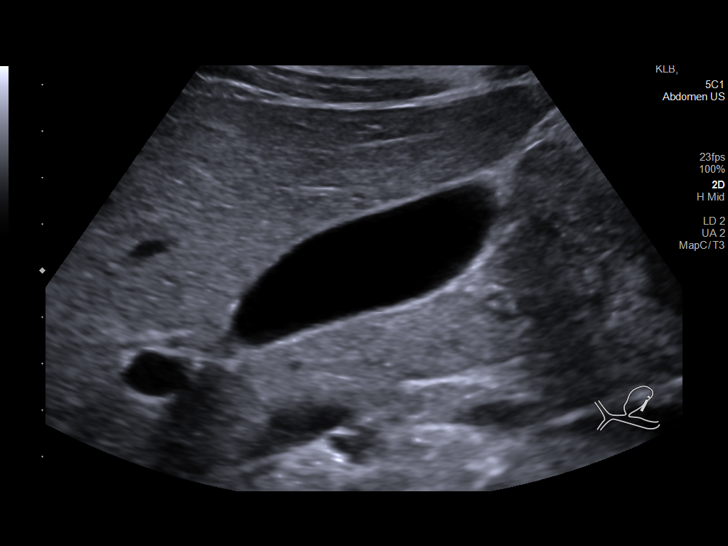
[im 10/111]
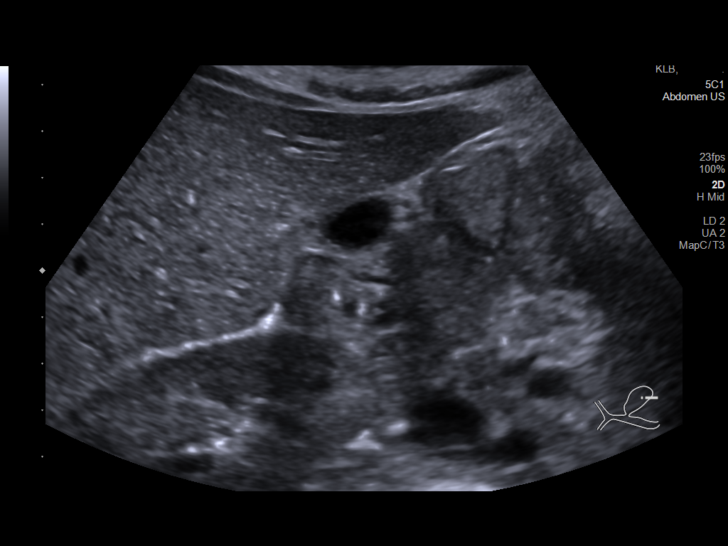
[im 19/111]
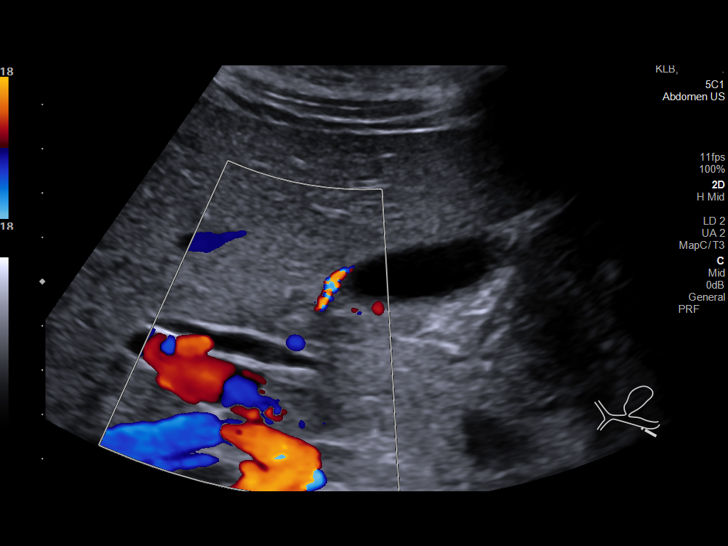
[im 28/111]
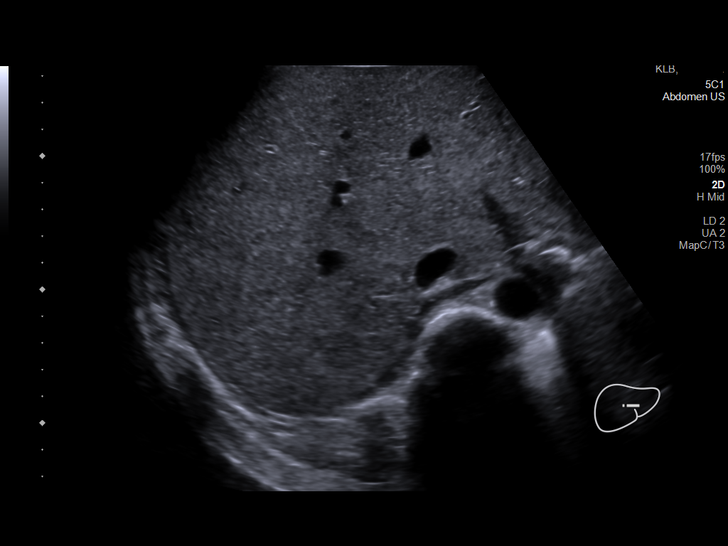
[im 37/111]
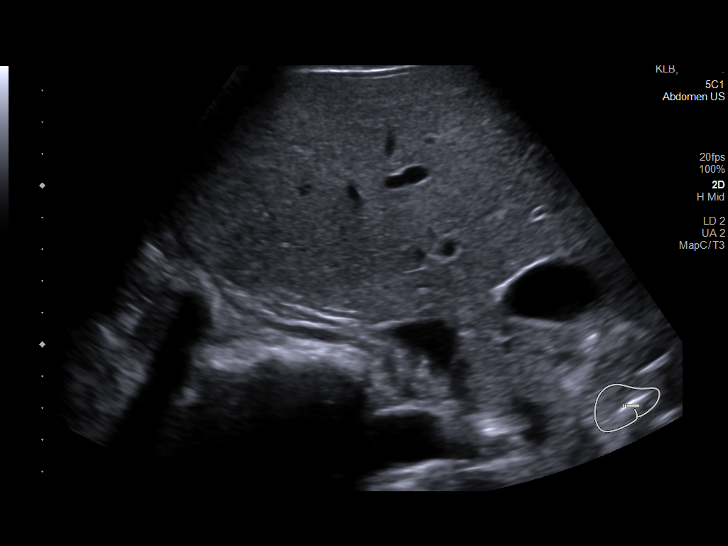
[im 42/111]
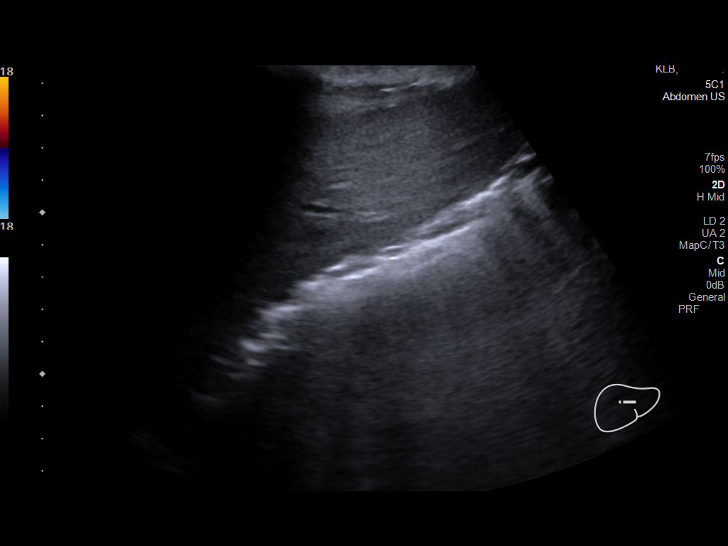
[im 51/111]
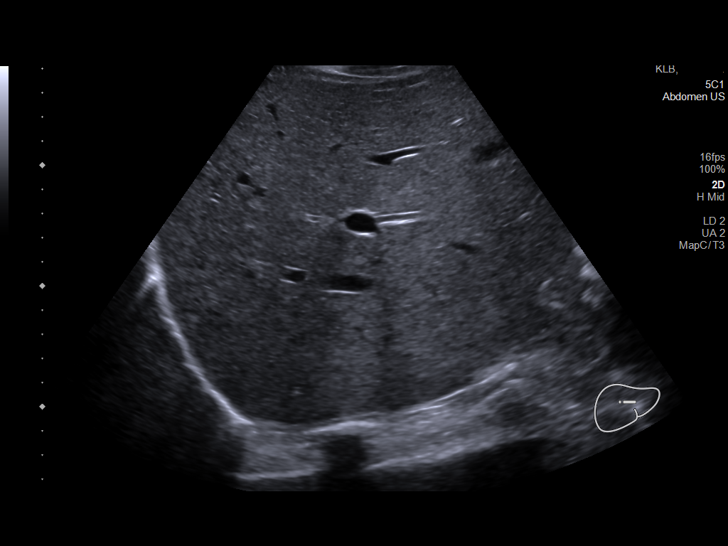
[im 60/111]
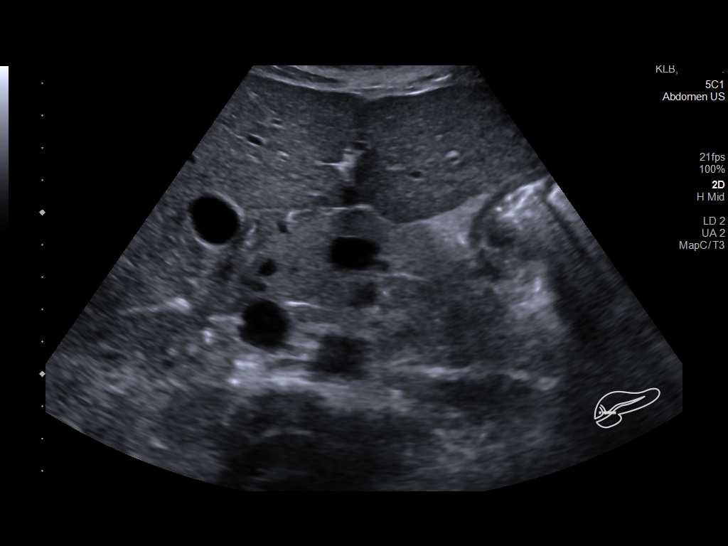
[im 69/111]
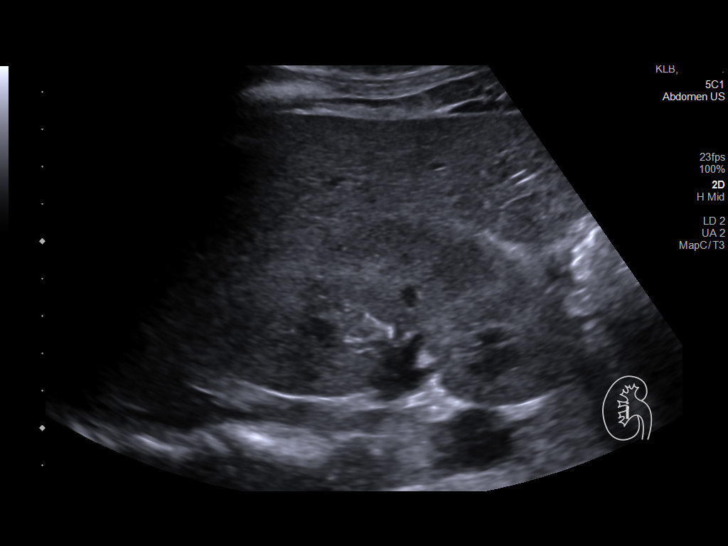
[im 74/111]
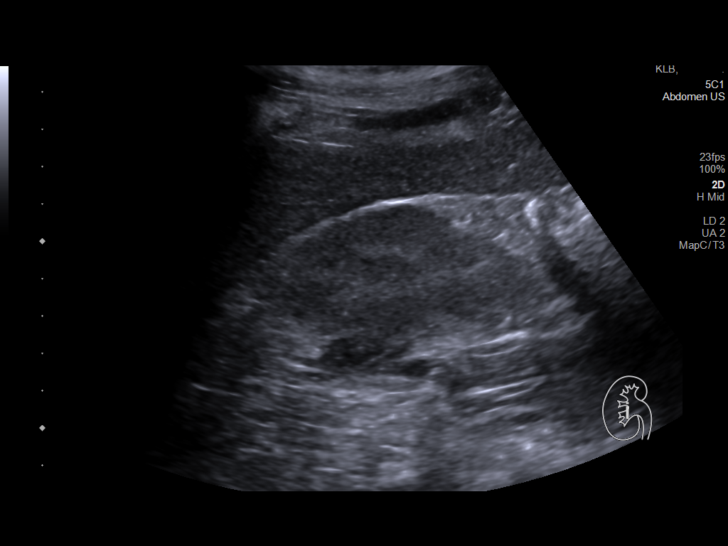
[im 83/111]
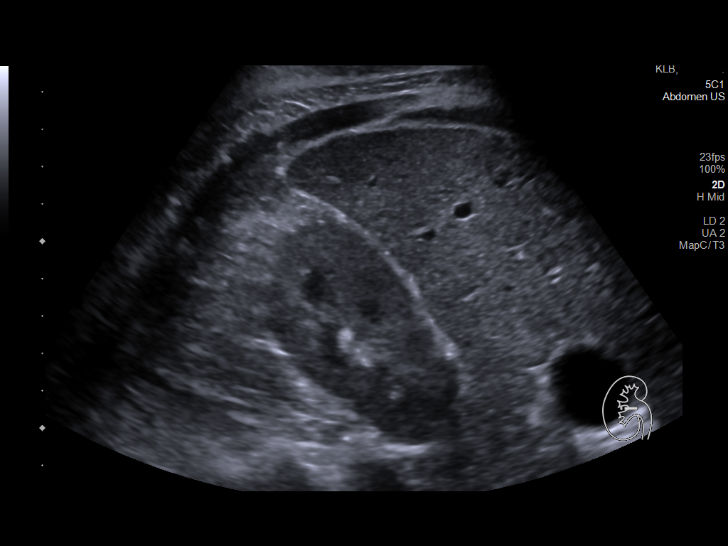
[im 92/111]
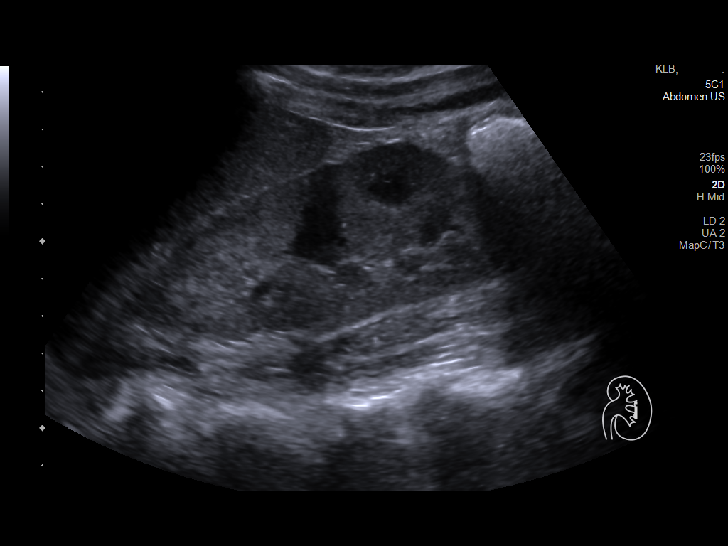
[im 101/111]
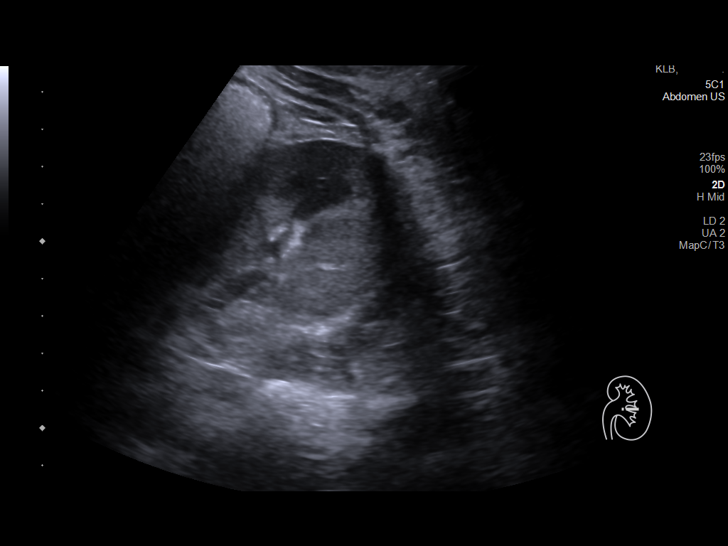
[im 111/111]
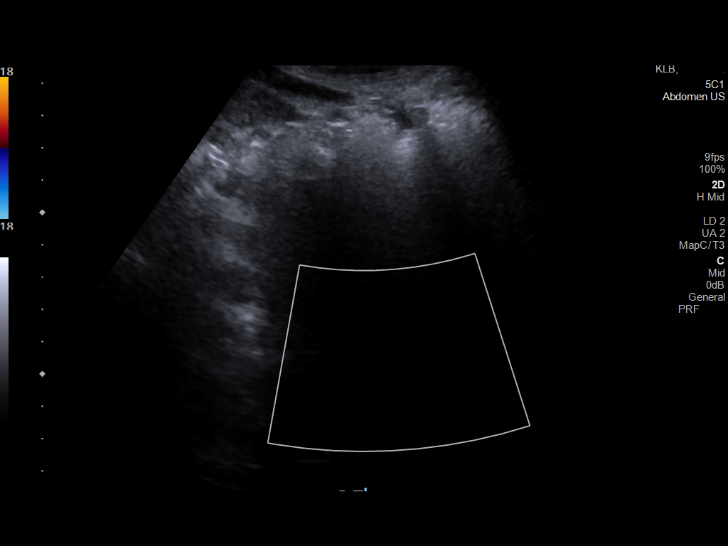

[14 of 25 positions shown; findings below may reference images not displayed]

FINDINGS: Gallbladder: No gallstones or wall thickening visualized. No
sonographic Murphy sign noted by sonographer.

Common bile duct: Diameter: 2 mm

Liver: No focal lesion identified. Within normal limits in
parenchymal echogenicity. Portal vein is patent on color Doppler
imaging with normal direction of blood flow towards the liver.

IVC: No abnormality visualized.

Pancreas: Visualized portion unremarkable.

Spleen: Size and appearance within normal limits.

Right Kidney: Length: 10.5 cm. Mild increased echogenicity. No
hydronephrosis or shadowing stone.

Left Kidney: Length: 11.6 cm. Mildly echogenic. No hydronephrosis or
shadowing stone.

Abdominal aorta: No aneurysm visualized.

Other findings: None.
IMPRESSION: Mildly echogenic kidneys, nonspecific, may represent a degree of
renal insufficiency. Clinical correlation is recommended. No other
abnormality identified.

## 2023-01-15 IMAGING — DX DG CHEST 1V PORT
1 series · 1 of 1 positions shown · non-contrast
Comparison: 01/23/2021

CLINICAL DATA: Lower extremity edema. Chest pain and elevated blood
sugar.

EXAM:
PORTABLE CHEST 1 VIEW

[chest ap]
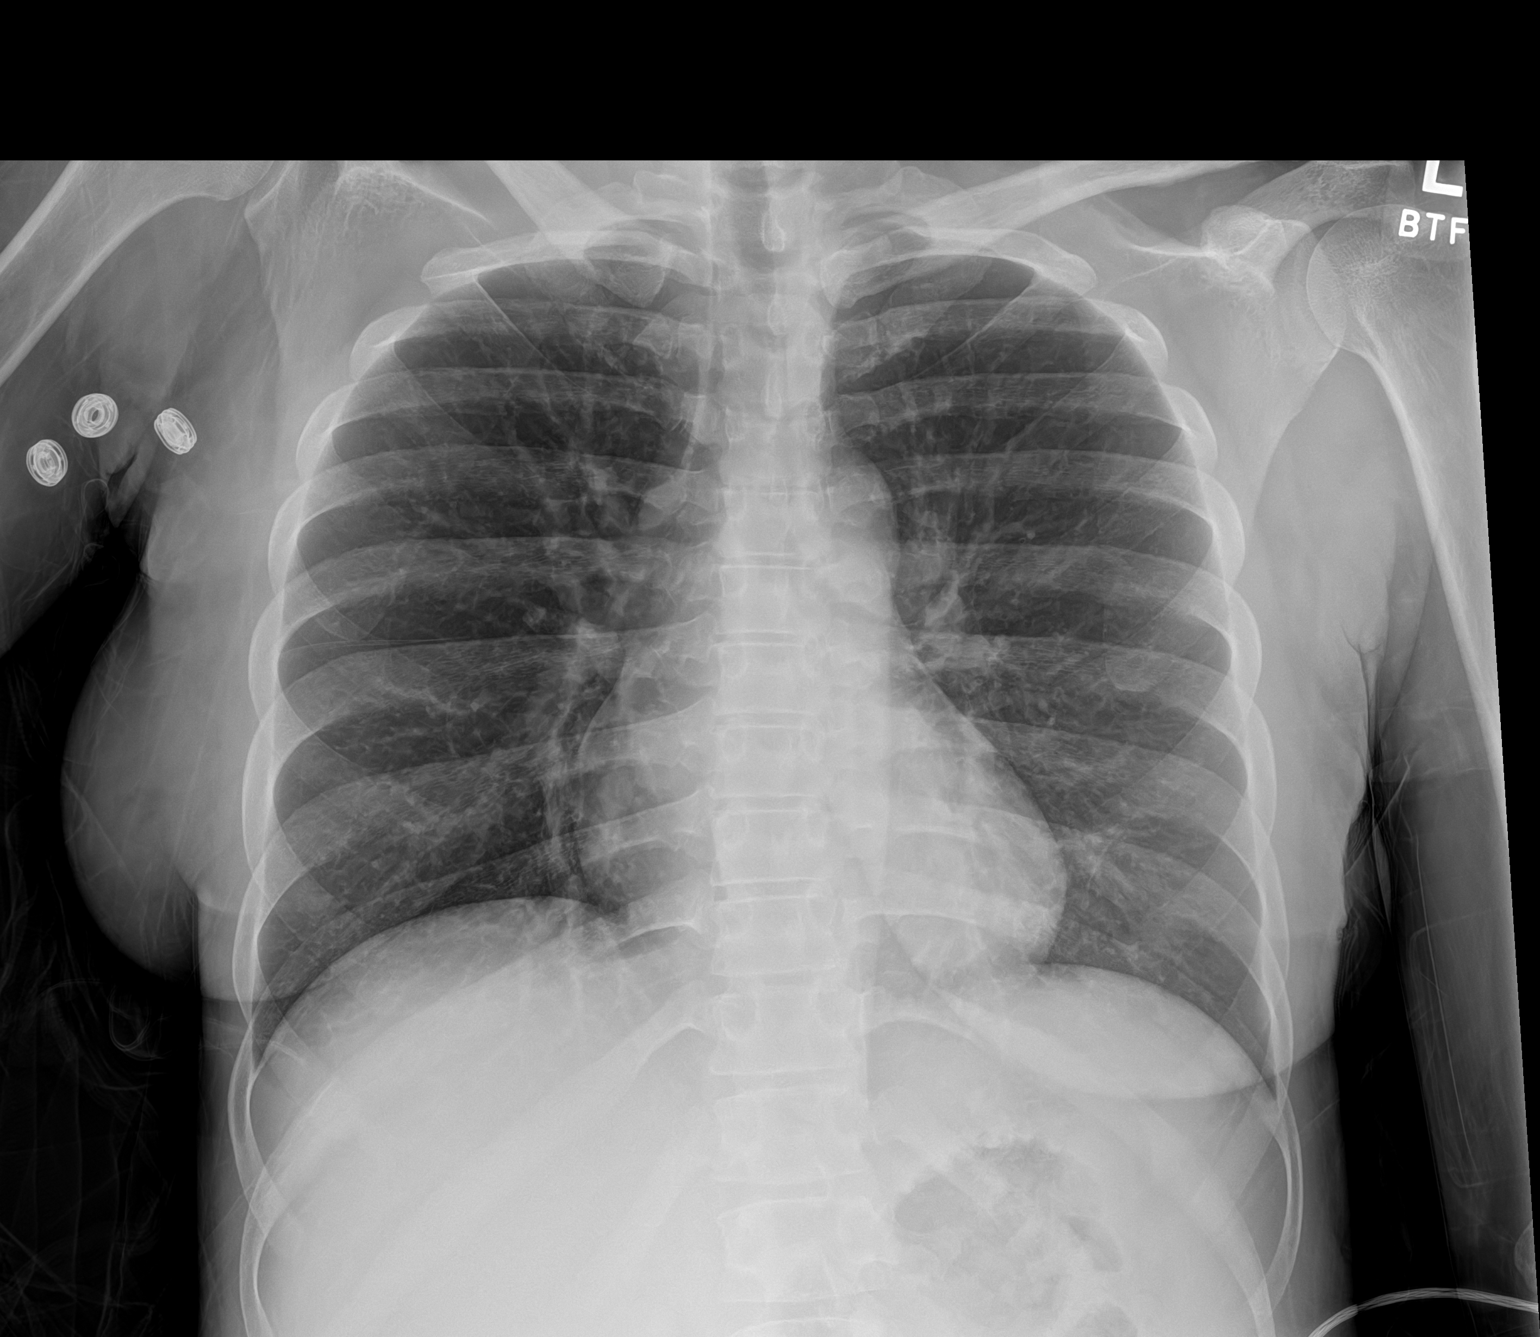

[1 of 1 positions shown; findings below may reference images not displayed]

FINDINGS: 4741 hours. The lungs are clear without focal pneumonia, edema,
pneumothorax or pleural effusion. The cardiopericardial silhouette
is within normal limits for size. The visualized bony structures of
the thorax show no acute abnormality.
IMPRESSION: No active disease.

## 2023-06-25 IMAGING — MR MR LUMBAR SPINE WO/W CM
9 series · 42 of 48 positions shown · IV contrast (gadavist)
Comparison: MRI from 03/06/2021.

CLINICAL DATA: Initial evaluation for lumbar spine infection.

EXAM:
MRI LUMBAR SPINE WITHOUT AND WITH CONTRAST
TECHNIQUE: Multiplanar and multiecho pulse sequences of the lumbar spine were
obtained without and with intravenous contrast.
CONTRAST:  5.5mL GADAVIST GADOBUTROL 1 MMOL/ML IV SOLN

[Series 5: T1 · sagittal · 4.0mm · 0.81mm/px · 4 of 14 slices shown (1 of 3)]
[im 1/14]
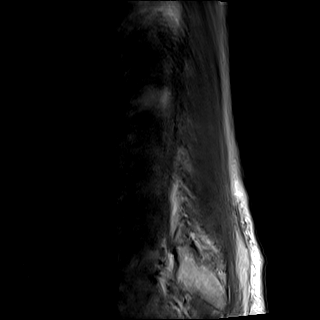
[im 5/14]
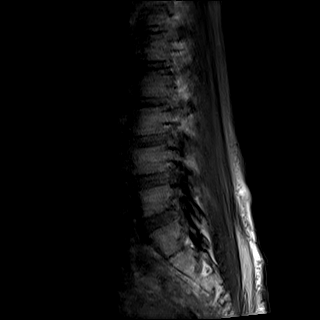
[im 9/14]
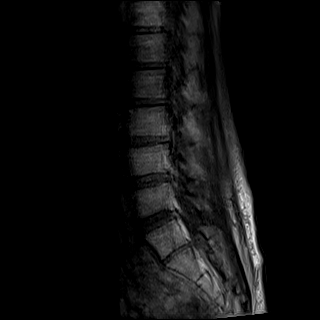
[im 14/14]
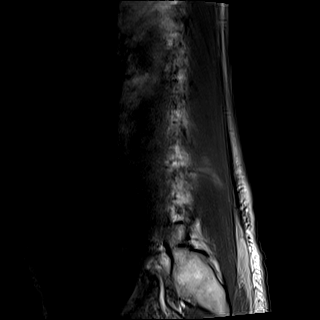

[Series 6: STIR · sagittal · 4.0mm · 0.51mm/px · 3 of 14 slices shown]
[im 1/14]
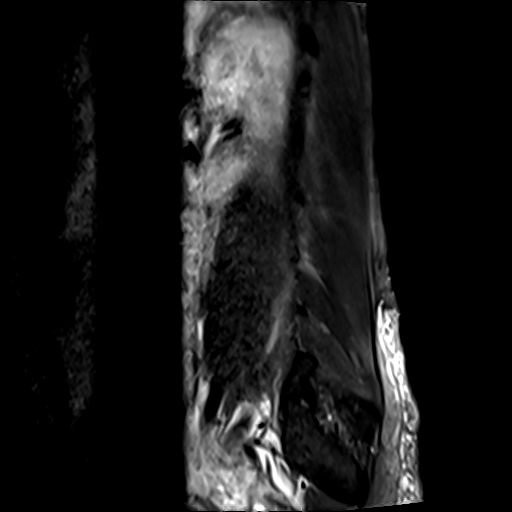
[im 7/14]
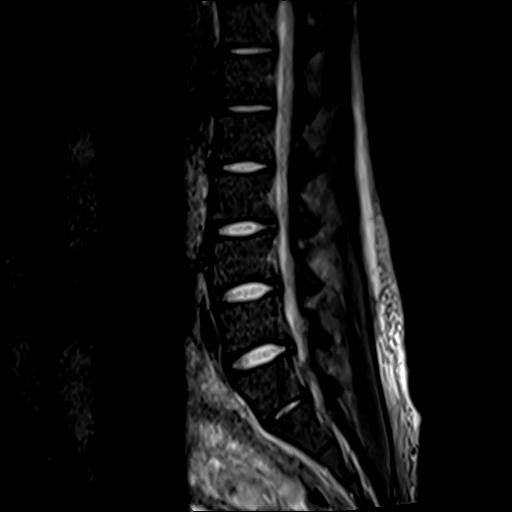
[im 14/14]
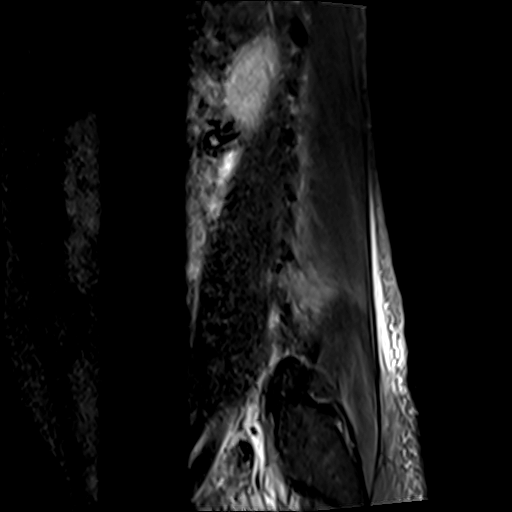

[Series 7: T1 · sagittal · 4.0mm · 0.81mm/px · 3 of 14 slices shown (2 of 3)]
[im 1/14]
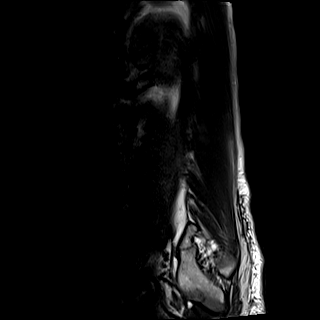
[im 7/14]
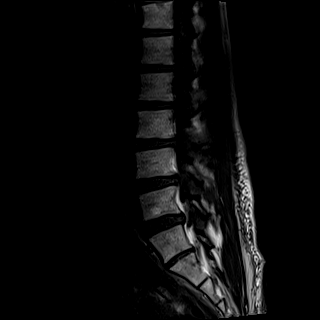
[im 14/14]
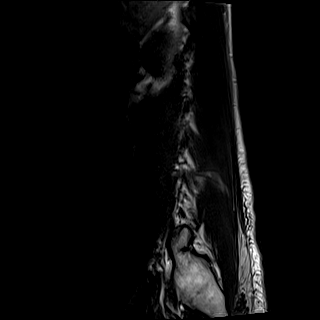

[Series 8: T2 · axial · 4.0mm · 0.62mm/px · z∈[-108,+95]mm · 8 of 39 slices shown (1 of 2)]
[im 1/39]
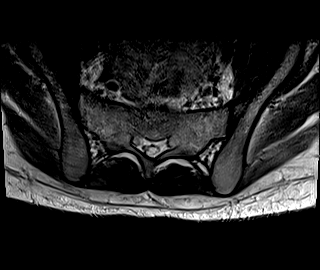
[im 6/39]
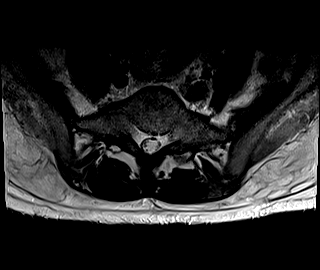
[im 11/39]
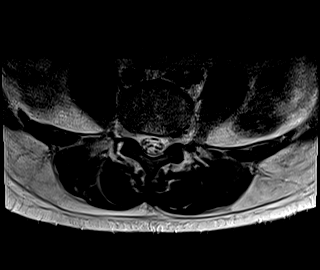
[im 17/39]
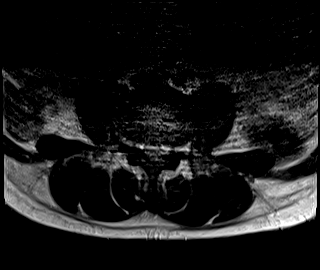
[im 22/39]
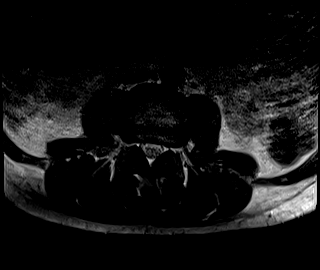
[im 28/39]
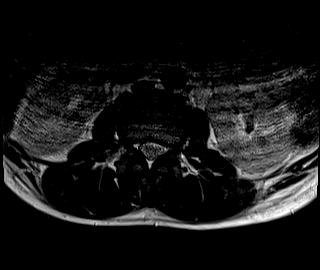
[im 33/39]
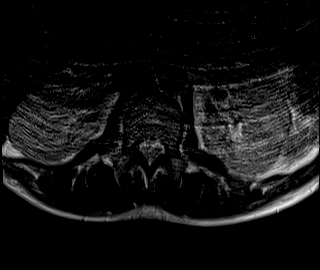
[im 39/39]
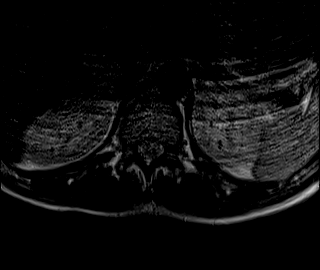

[Series 9: T1 · axial · 4.0mm · 0.62mm/px · z∈[-108,+95]mm · 8 of 39 slices shown (3 of 3)]
[im 1/39]
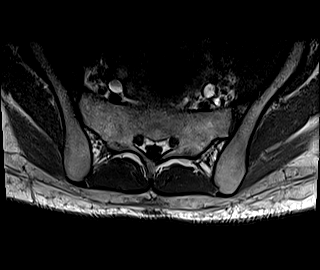
[im 6/39]
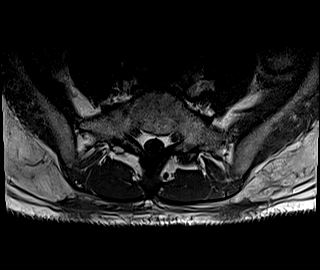
[im 11/39]
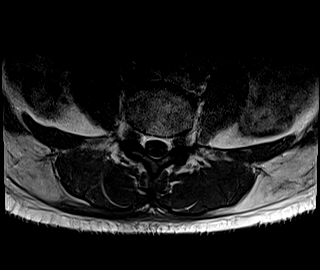
[im 17/39]
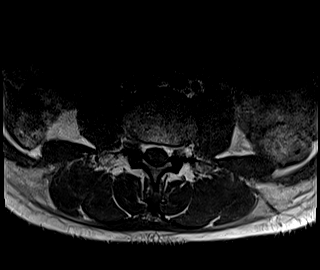
[im 22/39]
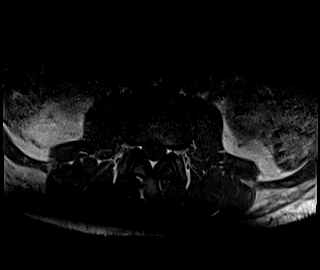
[im 28/39]
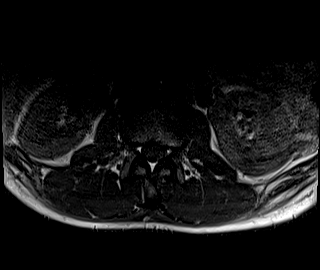
[im 33/39]
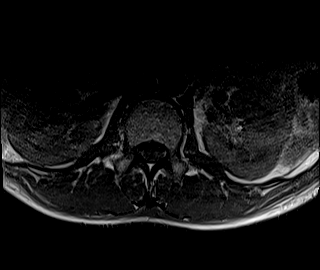
[im 39/39]
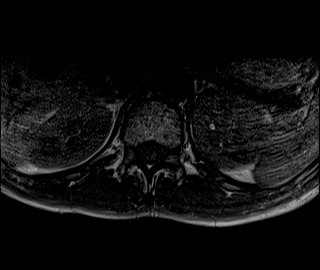

[Series 10: T2 · axial · 4.0mm · 0.78mm/px · z∈[-108,+95]mm · 8 of 39 slices shown (2 of 2)]
[im 1/39]
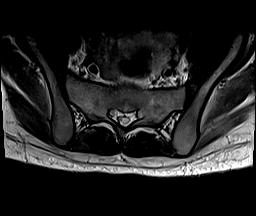
[im 6/39]
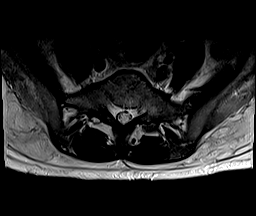
[im 11/39]
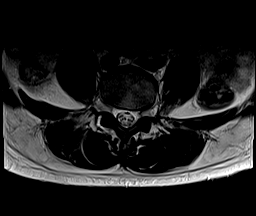
[im 17/39]
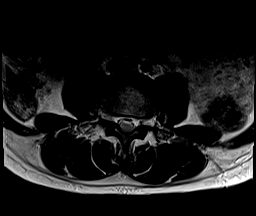
[im 22/39]
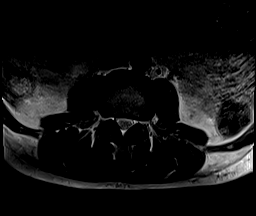
[im 28/39]
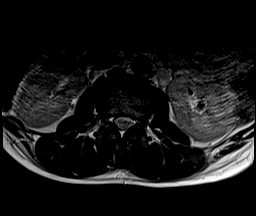
[im 33/39]
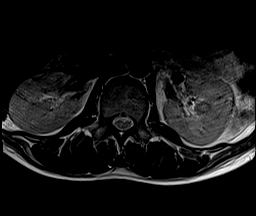
[im 39/39]
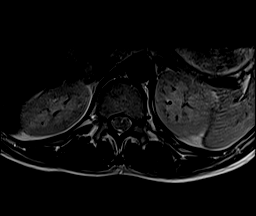

[Series 11: T2 post-contrast · sagittal · 4.0mm · 0.81mm/px · 3 of 14 slices shown]
[im 1/14]
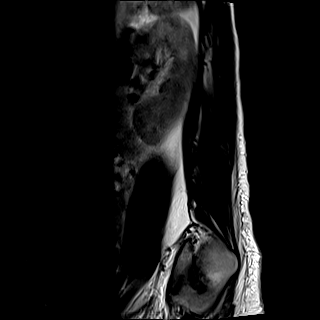
[im 7/14]
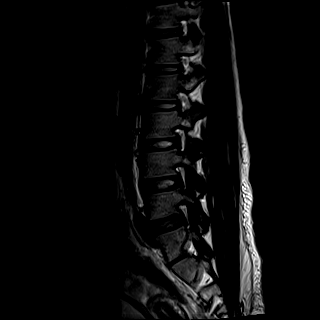
[im 14/14]
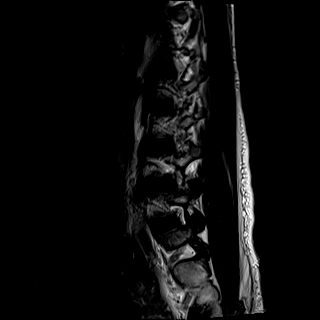

[Series 12: T1 fat-sat post-contrast · sagittal · 4.0mm · 0.81mm/px · 3 of 14 slices shown]
[im 1/14]
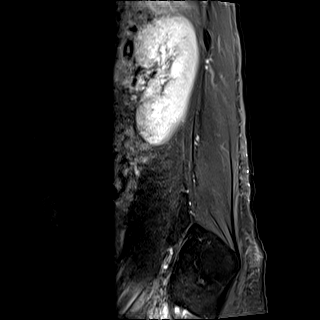
[im 7/14]
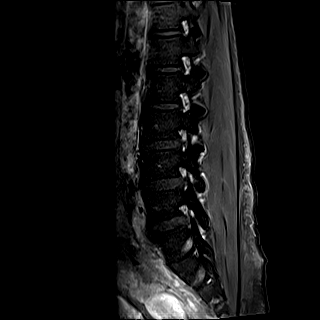
[im 14/14]
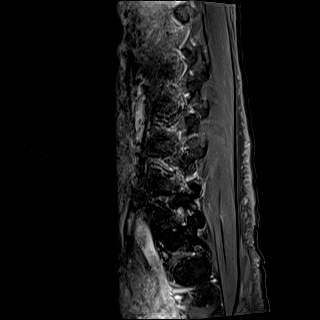

[Series 13: T1 post-contrast · axial · 4.0mm · 0.43mm/px · z∈[-115,-91]mm · 2 of 39 slices shown]
[im 1/39]
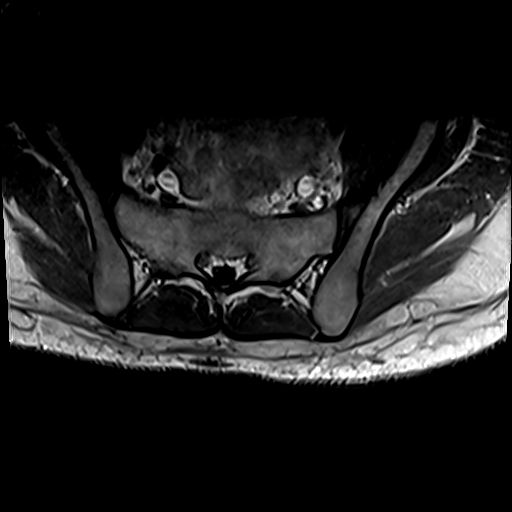
[im 6/39]
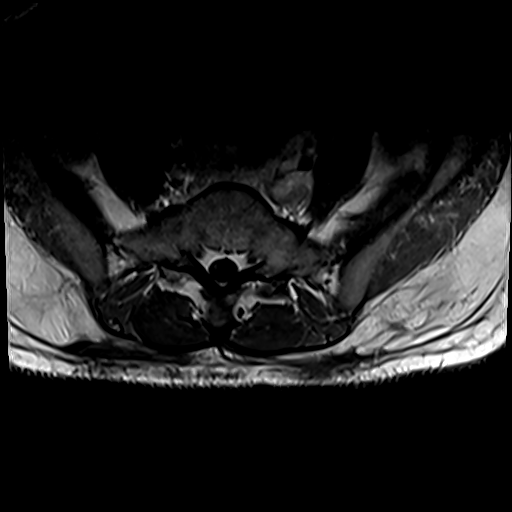

[42 of 48 positions shown; findings below may reference images not displayed]

FINDINGS: Segmentation: Transitional features about the lumbosacral junction.
Lowest well-formed disc space labeled the L5-S1 level.

Alignment: Straightening of the normal lumbar lordosis. No
listhesis.

Vertebrae: Vertebral body height maintained without acute or
interval fracture. Bone marrow signal intensity within normal
limits. No discrete or worrisome osseous lesions. No abnormal marrow
edema or enhancement to suggest osteomyelitis discitis or septic
arthritis.

Conus medullaris and cauda equina: Conus extends to the L1 level.
Conus and cauda equina appear normal. No epidural abscess or other
collection.

Paraspinal and other soft tissues: Previously seen fluid/edema
involving the subcutaneous fat of the posterior back is improved
from prior exam, with residual ill-defined stranding seen along the
superficial fascia (series 13, image 35). No discrete collections.
The deeper paraspinous soft tissues and musculature are no.
Visualized visceral structures within normal limits.

Disc levels:

Mild noncompressive disc bulging at L3-4 through L5-S1 again noted,
stable. No significant stenosis or progressive finding.
IMPRESSION: 1. Interval improvement in previously seen fluid/edema involving the
superficial subcutaneous fat of the posterior back, with residual
ill-defined stranding now seen along the superficial fascia. No
discrete collections.
2. No other evidence for acute infection within the lumbar spine. No
evidence for osteomyelitis discitis or septic arthritis. No epidural
abscess.
